# Patient Record
Sex: Female | Born: 1940
Health system: Southern US, Community
[De-identification: ages and names within clinical notes are randomized; demographics above are authoritative.]

## PROBLEM LIST (undated history)

## (undated) DIAGNOSIS — Z9981 Dependence on supplemental oxygen: Secondary | ICD-10-CM

## (undated) DIAGNOSIS — J939 Pneumothorax, unspecified: Secondary | ICD-10-CM

## (undated) DIAGNOSIS — Z9289 Personal history of other medical treatment: Secondary | ICD-10-CM

## (undated) DIAGNOSIS — Z87442 Personal history of urinary calculi: Secondary | ICD-10-CM

## (undated) DIAGNOSIS — R112 Nausea with vomiting, unspecified: Secondary | ICD-10-CM

## (undated) DIAGNOSIS — I209 Angina pectoris, unspecified: Secondary | ICD-10-CM

## (undated) DIAGNOSIS — K228 Other specified diseases of esophagus: Secondary | ICD-10-CM

## (undated) DIAGNOSIS — I2510 Atherosclerotic heart disease of native coronary artery without angina pectoris: Secondary | ICD-10-CM

## (undated) DIAGNOSIS — R0602 Shortness of breath: Secondary | ICD-10-CM

## (undated) DIAGNOSIS — Z720 Tobacco use: Secondary | ICD-10-CM

## (undated) DIAGNOSIS — S069X9A Unspecified intracranial injury with loss of consciousness of unspecified duration, initial encounter: Secondary | ICD-10-CM

## (undated) DIAGNOSIS — T4145XA Adverse effect of unspecified anesthetic, initial encounter: Secondary | ICD-10-CM

## (undated) DIAGNOSIS — Z95828 Presence of other vascular implants and grafts: Secondary | ICD-10-CM

## (undated) DIAGNOSIS — Z8669 Personal history of other diseases of the nervous system and sense organs: Secondary | ICD-10-CM

## (undated) DIAGNOSIS — J449 Chronic obstructive pulmonary disease, unspecified: Secondary | ICD-10-CM

## (undated) DIAGNOSIS — Z8709 Personal history of other diseases of the respiratory system: Secondary | ICD-10-CM

## (undated) DIAGNOSIS — J439 Emphysema, unspecified: Secondary | ICD-10-CM

## (undated) DIAGNOSIS — Z9889 Other specified postprocedural states: Secondary | ICD-10-CM

## (undated) DIAGNOSIS — C801 Malignant (primary) neoplasm, unspecified: Secondary | ICD-10-CM

## (undated) DIAGNOSIS — J4489 Other specified chronic obstructive pulmonary disease: Secondary | ICD-10-CM

## (undated) DIAGNOSIS — F419 Anxiety disorder, unspecified: Secondary | ICD-10-CM

## (undated) DIAGNOSIS — K5900 Constipation, unspecified: Secondary | ICD-10-CM

## (undated) DIAGNOSIS — K222 Esophageal obstruction: Secondary | ICD-10-CM

## (undated) DIAGNOSIS — F99 Mental disorder, not otherwise specified: Secondary | ICD-10-CM

## (undated) DIAGNOSIS — I499 Cardiac arrhythmia, unspecified: Secondary | ICD-10-CM

## (undated) DIAGNOSIS — T8859XA Other complications of anesthesia, initial encounter: Secondary | ICD-10-CM

## (undated) DIAGNOSIS — K219 Gastro-esophageal reflux disease without esophagitis: Secondary | ICD-10-CM

## (undated) DIAGNOSIS — M1990 Unspecified osteoarthritis, unspecified site: Secondary | ICD-10-CM

## (undated) DIAGNOSIS — I10 Essential (primary) hypertension: Secondary | ICD-10-CM

## (undated) DIAGNOSIS — K2289 Other specified disease of esophagus: Secondary | ICD-10-CM

## (undated) DIAGNOSIS — F32A Depression, unspecified: Secondary | ICD-10-CM

## (undated) DIAGNOSIS — Z8774 Personal history of (corrected) congenital malformations of heart and circulatory system: Secondary | ICD-10-CM

## (undated) DIAGNOSIS — F329 Major depressive disorder, single episode, unspecified: Secondary | ICD-10-CM

## (undated) DIAGNOSIS — E785 Hyperlipidemia, unspecified: Secondary | ICD-10-CM

## (undated) HISTORY — DX: Tobacco use: Z72.0

## (undated) HISTORY — PX: MASTECTOMY: SHX3

## (undated) HISTORY — DX: Emphysema, unspecified: J43.9

## (undated) HISTORY — DX: Personal history of other diseases of the respiratory system: Z87.09

## (undated) HISTORY — DX: Personal history of other medical treatment: Z92.89

## (undated) HISTORY — DX: Atherosclerotic heart disease of native coronary artery without angina pectoris: I25.10

## (undated) HISTORY — DX: Other specified postprocedural states: Z98.890

## (undated) HISTORY — PX: COLONOSCOPY: SHX174

## (undated) HISTORY — DX: Unspecified osteoarthritis, unspecified site: M19.90

## (undated) HISTORY — DX: Personal history of other diseases of the nervous system and sense organs: Z86.69

## (undated) HISTORY — PX: CATARACT EXTRACTION: SUR2

## (undated) HISTORY — DX: Chronic obstructive pulmonary disease, unspecified: J44.9

## (undated) HISTORY — PX: BREAST SURGERY: SHX581

## (undated) HISTORY — DX: Gastro-esophageal reflux disease without esophagitis: K21.9

## (undated) HISTORY — DX: Essential (primary) hypertension: I10

## (undated) HISTORY — PX: CARDIAC CATHETERIZATION: SHX172

## (undated) HISTORY — PX: OOPHORECTOMY: SHX86

## (undated) HISTORY — DX: Other specified chronic obstructive pulmonary disease: J44.89

## (undated) HISTORY — PX: BACK SURGERY: SHX140

## (undated) HISTORY — PX: TOTAL ABDOMINAL HYSTERECTOMY W/ BILATERAL SALPINGOOPHORECTOMY: SHX83

## (undated) HISTORY — DX: Hyperlipidemia, unspecified: E78.5

---

## 1974-03-30 HISTORY — PX: LAMINECTOMY: SHX219

## 1976-03-30 HISTORY — PX: MASTECTOMY: SHX3

## 1997-07-28 ENCOUNTER — Encounter: Payer: Self-pay | Admitting: Family Medicine

## 1997-07-28 LAB — CONVERTED CEMR LAB: Pap Smear: NORMAL

## 1998-03-30 HISTORY — PX: CAROTID STENT: SHX1301

## 1998-07-29 DIAGNOSIS — J449 Chronic obstructive pulmonary disease, unspecified: Secondary | ICD-10-CM | POA: Insufficient documentation

## 1998-07-31 ENCOUNTER — Ambulatory Visit (HOSPITAL_COMMUNITY): Admission: RE | Admit: 1998-07-31 | Discharge: 1998-07-31 | Payer: Self-pay | Admitting: Internal Medicine

## 1998-08-29 DIAGNOSIS — K219 Gastro-esophageal reflux disease without esophagitis: Secondary | ICD-10-CM

## 1998-08-29 HISTORY — DX: Gastro-esophageal reflux disease without esophagitis: K21.9

## 1998-08-29 HISTORY — PX: ESOPHAGEAL DILATION: SHX303

## 1999-01-22 DIAGNOSIS — I2510 Atherosclerotic heart disease of native coronary artery without angina pectoris: Secondary | ICD-10-CM | POA: Insufficient documentation

## 1999-01-22 DIAGNOSIS — Z9889 Other specified postprocedural states: Secondary | ICD-10-CM

## 1999-01-22 HISTORY — DX: Other specified postprocedural states: Z98.890

## 1999-01-22 HISTORY — DX: Atherosclerotic heart disease of native coronary artery without angina pectoris: I25.10

## 1999-05-29 DIAGNOSIS — Z9289 Personal history of other medical treatment: Secondary | ICD-10-CM

## 1999-05-29 HISTORY — DX: Personal history of other medical treatment: Z92.89

## 2000-03-30 DIAGNOSIS — I10 Essential (primary) hypertension: Secondary | ICD-10-CM | POA: Insufficient documentation

## 2001-08-25 ENCOUNTER — Encounter: Payer: Self-pay | Admitting: Plastic Surgery

## 2001-08-25 ENCOUNTER — Ambulatory Visit (HOSPITAL_COMMUNITY): Admission: RE | Admit: 2001-08-25 | Discharge: 2001-08-25 | Payer: Self-pay | Admitting: Plastic Surgery

## 2004-05-10 ENCOUNTER — Other Ambulatory Visit: Payer: Self-pay

## 2004-05-10 ENCOUNTER — Emergency Department: Payer: Self-pay | Admitting: General Practice

## 2004-06-08 ENCOUNTER — Emergency Department: Payer: Self-pay | Admitting: Emergency Medicine

## 2004-06-29 ENCOUNTER — Emergency Department (HOSPITAL_COMMUNITY): Admission: EM | Admit: 2004-06-29 | Discharge: 2004-06-29 | Payer: Self-pay | Admitting: Emergency Medicine

## 2004-11-11 ENCOUNTER — Ambulatory Visit: Payer: Self-pay | Admitting: Family Medicine

## 2005-01-28 ENCOUNTER — Ambulatory Visit: Payer: Self-pay | Admitting: Family Medicine

## 2005-04-01 ENCOUNTER — Ambulatory Visit: Payer: Self-pay | Admitting: Family Medicine

## 2005-04-08 ENCOUNTER — Ambulatory Visit: Payer: Self-pay | Admitting: Family Medicine

## 2005-04-23 ENCOUNTER — Ambulatory Visit: Payer: Self-pay | Admitting: Family Medicine

## 2005-04-30 ENCOUNTER — Encounter: Payer: Self-pay | Admitting: Family Medicine

## 2005-04-30 LAB — CONVERTED CEMR LAB: Pap Smear: NORMAL

## 2005-05-04 ENCOUNTER — Encounter: Payer: Self-pay | Admitting: Family Medicine

## 2005-05-04 ENCOUNTER — Other Ambulatory Visit: Admission: RE | Admit: 2005-05-04 | Discharge: 2005-05-04 | Payer: Self-pay | Admitting: Family Medicine

## 2005-05-04 ENCOUNTER — Ambulatory Visit: Payer: Self-pay | Admitting: Family Medicine

## 2005-05-31 ENCOUNTER — Emergency Department: Payer: Self-pay | Admitting: Unknown Physician Specialty

## 2005-06-02 ENCOUNTER — Ambulatory Visit: Payer: Self-pay | Admitting: Family Medicine

## 2005-06-03 ENCOUNTER — Ambulatory Visit: Payer: Self-pay | Admitting: Family Medicine

## 2005-06-16 ENCOUNTER — Ambulatory Visit: Payer: Self-pay | Admitting: Family Medicine

## 2005-11-05 ENCOUNTER — Ambulatory Visit: Payer: Self-pay | Admitting: Family Medicine

## 2006-01-06 ENCOUNTER — Ambulatory Visit: Payer: Self-pay | Admitting: Family Medicine

## 2006-02-15 ENCOUNTER — Other Ambulatory Visit: Payer: Self-pay

## 2006-02-16 ENCOUNTER — Other Ambulatory Visit: Payer: Self-pay

## 2006-02-16 ENCOUNTER — Inpatient Hospital Stay: Payer: Self-pay | Admitting: Internal Medicine

## 2006-04-23 ENCOUNTER — Ambulatory Visit: Payer: Self-pay | Admitting: Family Medicine

## 2006-04-26 ENCOUNTER — Ambulatory Visit: Payer: Self-pay | Admitting: Family Medicine

## 2006-07-19 ENCOUNTER — Encounter: Payer: Self-pay | Admitting: Family Medicine

## 2006-07-19 DIAGNOSIS — I4949 Other premature depolarization: Secondary | ICD-10-CM

## 2006-07-19 DIAGNOSIS — N951 Menopausal and female climacteric states: Secondary | ICD-10-CM

## 2006-09-11 IMAGING — CR DG LUMBAR SPINE 2-3V
1 series · 3 of 3 positions shown · non-contrast
Comparison: none

REASON FOR EXAM: Back pain
COMMENTS:

[Series 1: view not recorded · 0.17mm/px · 3 of 3 slices shown]
[im 1/3]
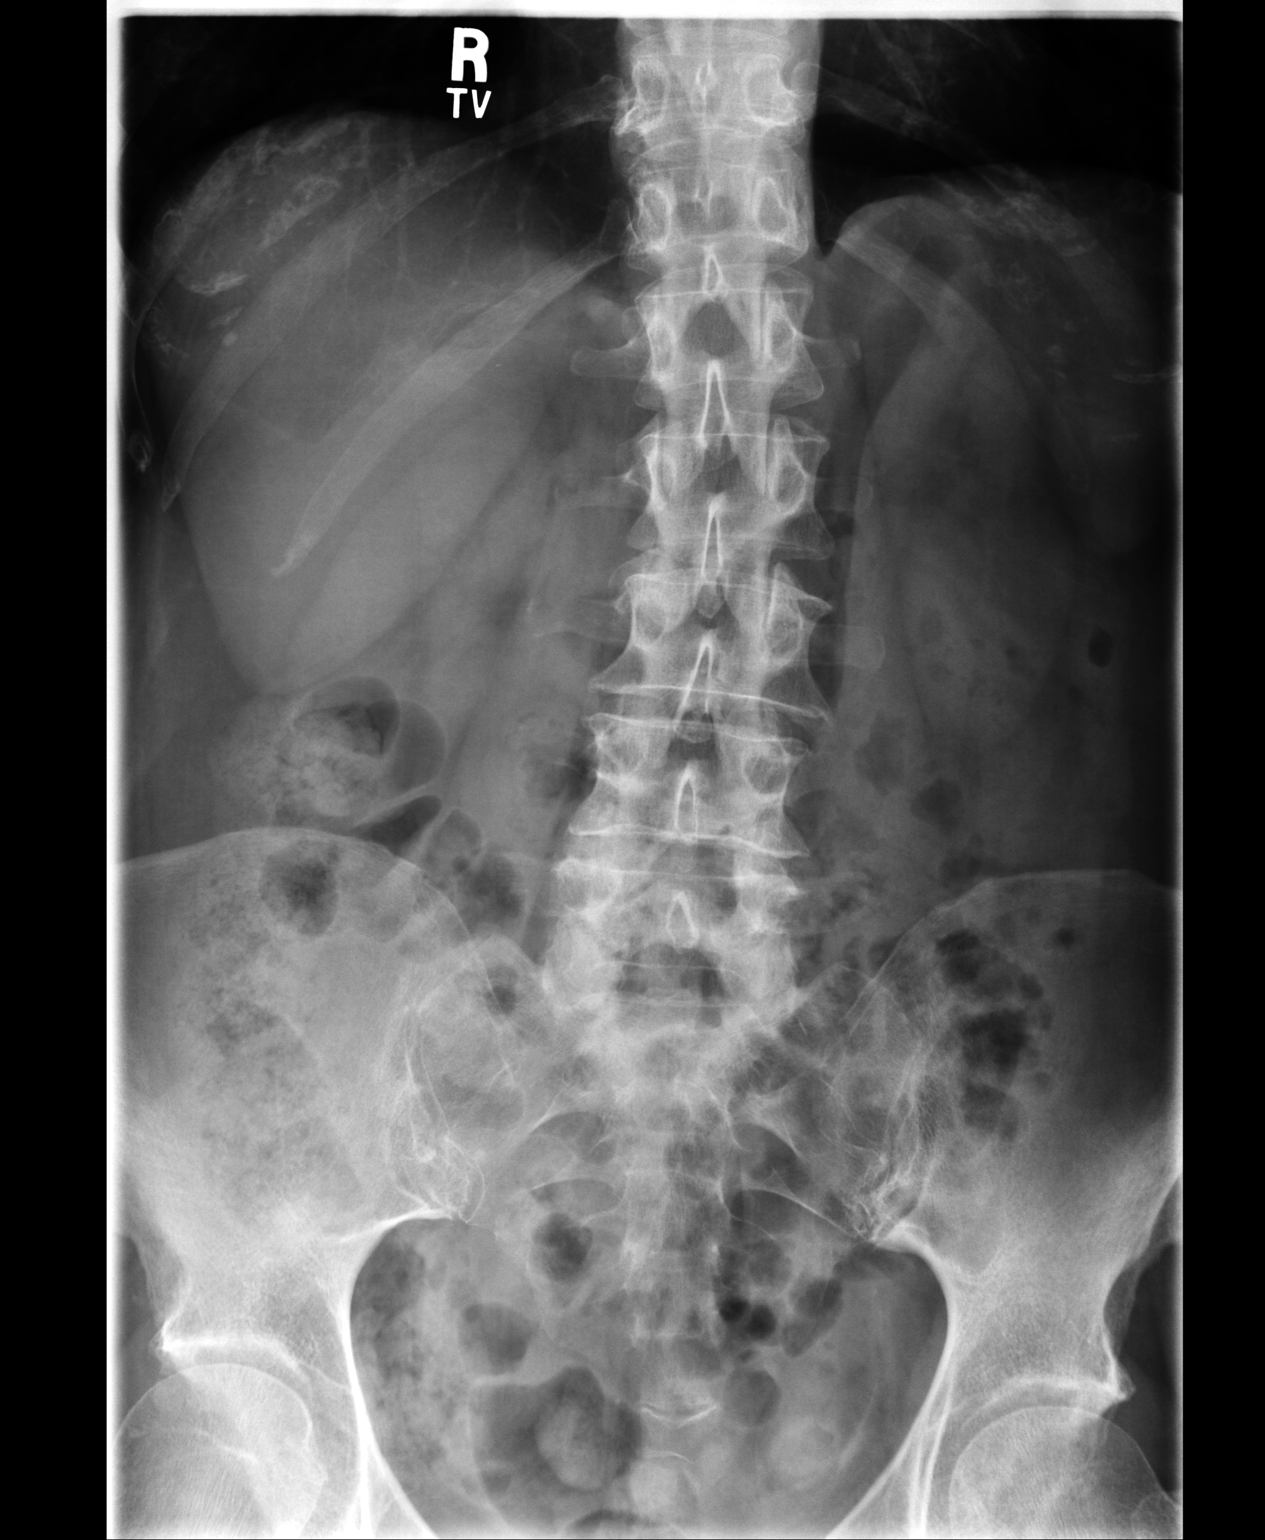
[im 2/3]
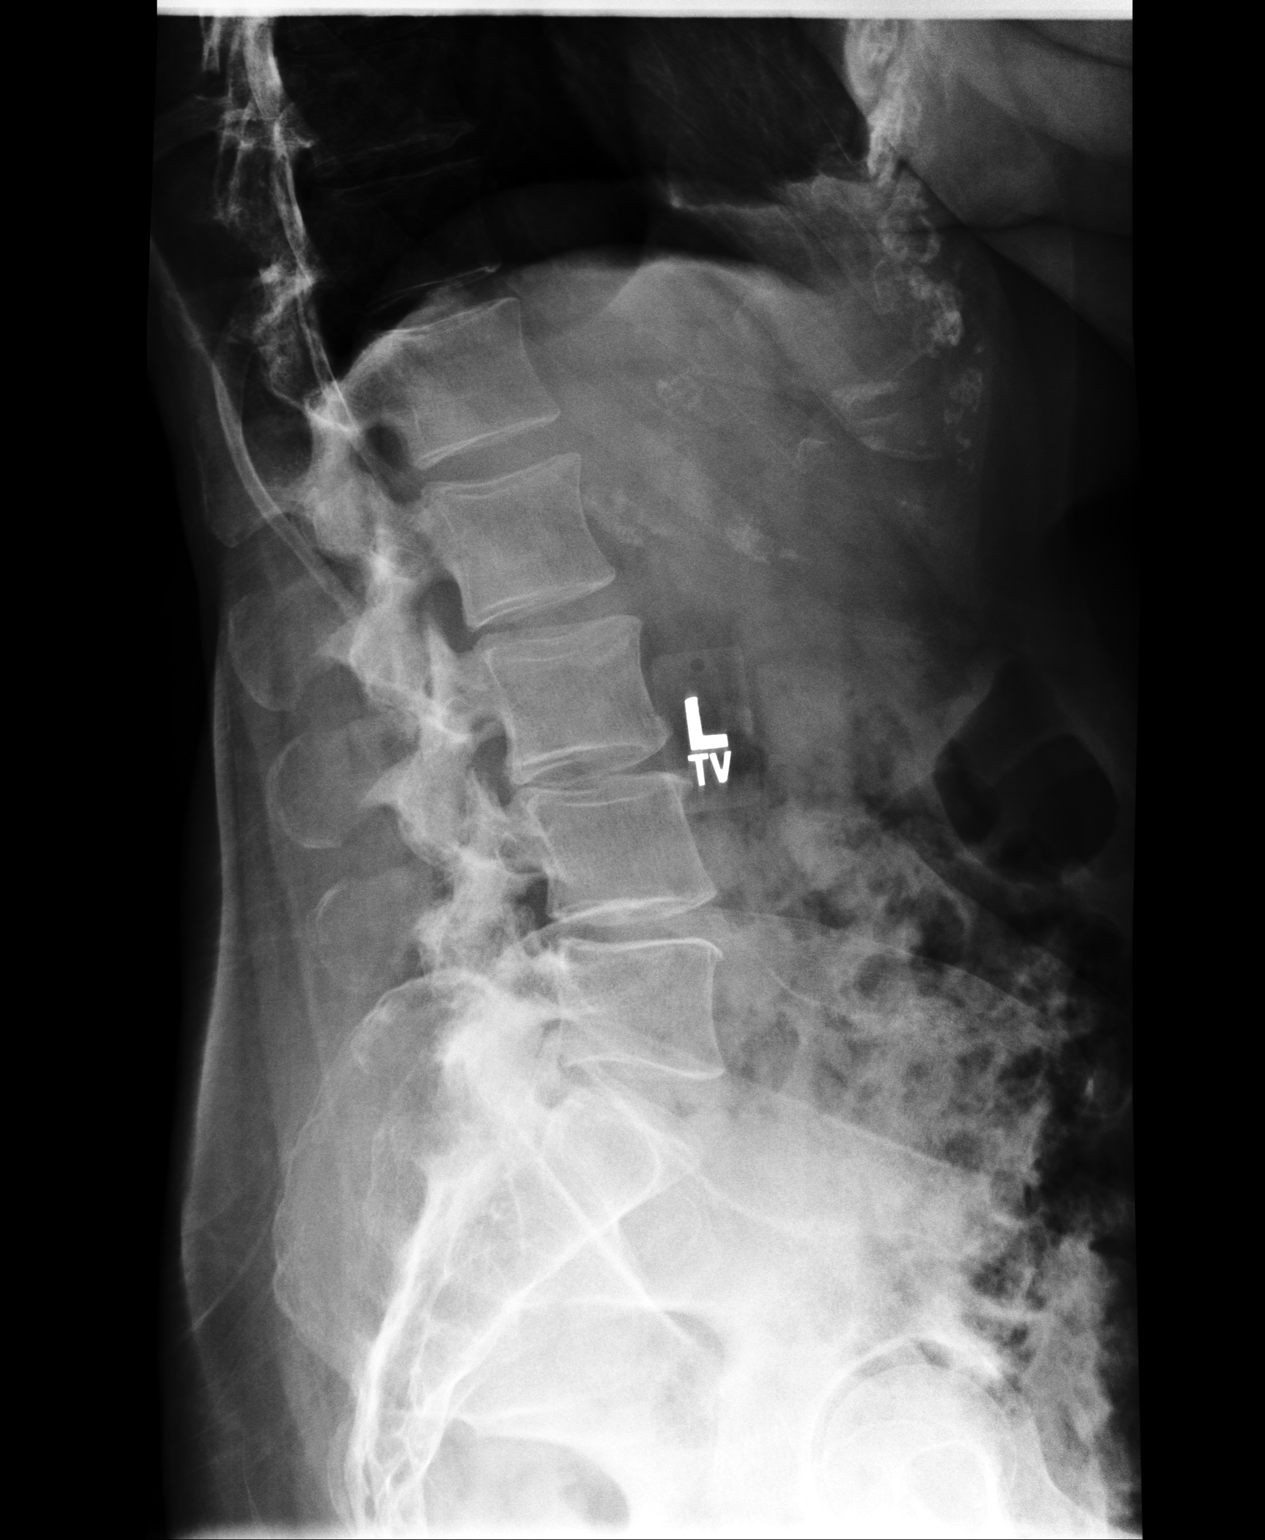
[im 3/3]
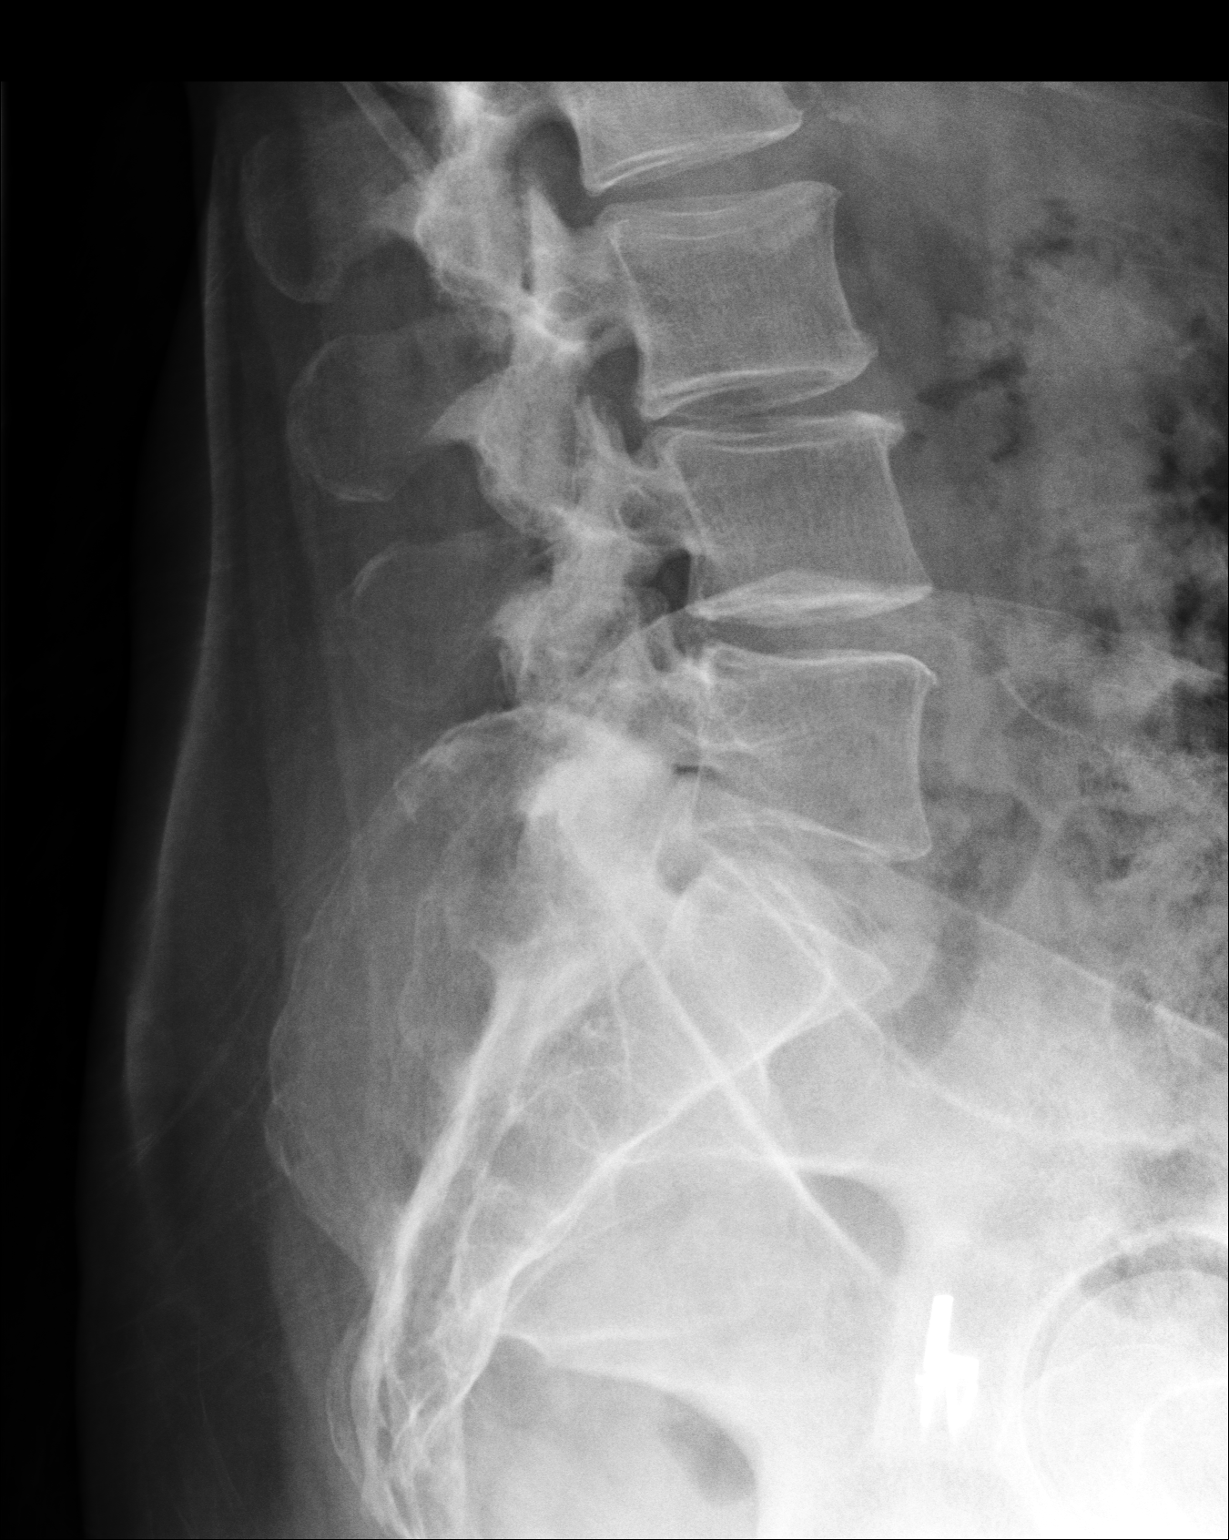

[3 of 3 positions shown; findings below may reference images not displayed]

PROCEDURE:     DXR - DXR LUMBAR SPINE AP AND LATERAL  - May 31, 2005  [DATE]

RESULT:          Five non-rib-bearing lumbar vertebral bodies are
appreciated, counting in a cranial to caudal direction.  The bones are
osteopenic.  There does not appear to be evidence of acute fracture nor
dislocation.  Findings consistent with early/mild degenerative disc disease
is appreciated at the L3-4 and L4-5 levels.

There is mild levoscoliosis of the lumbar spine.

Evaluation of the frontal view of the abdomen demonstrates a large amount of
stool within the ascending colon.
IMPRESSION: No evidence of acute abnormalities within the lumbar
spine.  If there are persistent complaints of pain or persistent clinical
concern, further evaluation with lumbar MRI is recommended if clinically
warranted.

## 2006-09-30 ENCOUNTER — Ambulatory Visit: Payer: Self-pay | Admitting: Family Medicine

## 2006-09-30 DIAGNOSIS — M545 Low back pain: Secondary | ICD-10-CM

## 2006-10-05 ENCOUNTER — Telehealth (INDEPENDENT_AMBULATORY_CARE_PROVIDER_SITE_OTHER): Payer: Self-pay | Admitting: *Deleted

## 2006-10-08 ENCOUNTER — Emergency Department: Payer: Self-pay | Admitting: Emergency Medicine

## 2006-10-22 ENCOUNTER — Ambulatory Visit: Payer: Self-pay | Admitting: Internal Medicine

## 2006-10-22 DIAGNOSIS — R51 Headache: Secondary | ICD-10-CM

## 2006-10-22 DIAGNOSIS — R519 Headache, unspecified: Secondary | ICD-10-CM | POA: Insufficient documentation

## 2006-10-25 LAB — CONVERTED CEMR LAB
Basophils Absolute: 0.1 10*3/uL (ref 0.0–0.1)
Basophils Relative: 0.8 % (ref 0.0–1.0)
Eosinophils Absolute: 0.3 10*3/uL (ref 0.0–0.6)
Eosinophils Relative: 2.8 % (ref 0.0–5.0)
HCT: 43.9 % (ref 36.0–46.0)
Hemoglobin: 15.3 g/dL — ABNORMAL HIGH (ref 12.0–15.0)
Lymphocytes Relative: 33.4 % (ref 12.0–46.0)
MCHC: 35 g/dL (ref 30.0–36.0)
MCV: 89 fL (ref 78.0–100.0)
Monocytes Absolute: 0.6 10*3/uL (ref 0.2–0.7)
Monocytes Relative: 6.3 % (ref 3.0–11.0)
Neutro Abs: 5.8 10*3/uL (ref 1.4–7.7)
Neutrophils Relative %: 56.7 % (ref 43.0–77.0)
Platelets: 274 10*3/uL (ref 150–400)
RBC: 4.93 M/uL (ref 3.87–5.11)
RDW: 11.9 % (ref 11.5–14.6)
Sed Rate: 10 mm/hr (ref 0–25)
WBC: 10.2 10*3/uL (ref 4.5–10.5)

## 2006-10-27 ENCOUNTER — Encounter: Payer: Self-pay | Admitting: Family Medicine

## 2007-01-07 ENCOUNTER — Ambulatory Visit: Payer: Self-pay | Admitting: Family Medicine

## 2007-02-19 ENCOUNTER — Emergency Department: Payer: Self-pay | Admitting: Emergency Medicine

## 2007-02-23 ENCOUNTER — Emergency Department: Payer: Self-pay | Admitting: Emergency Medicine

## 2007-04-20 ENCOUNTER — Other Ambulatory Visit: Payer: Self-pay

## 2007-04-20 ENCOUNTER — Emergency Department: Payer: Self-pay | Admitting: Internal Medicine

## 2007-04-27 ENCOUNTER — Encounter: Payer: Self-pay | Admitting: Family Medicine

## 2007-05-09 ENCOUNTER — Ambulatory Visit: Payer: Self-pay | Admitting: Family Medicine

## 2007-05-09 DIAGNOSIS — R1031 Right lower quadrant pain: Secondary | ICD-10-CM

## 2007-05-09 DIAGNOSIS — R1032 Left lower quadrant pain: Secondary | ICD-10-CM

## 2007-05-10 ENCOUNTER — Encounter: Payer: Self-pay | Admitting: Family Medicine

## 2007-05-13 ENCOUNTER — Telehealth: Payer: Self-pay | Admitting: Family Medicine

## 2007-05-19 ENCOUNTER — Ambulatory Visit: Payer: Self-pay | Admitting: Gastroenterology

## 2007-05-19 LAB — CONVERTED CEMR LAB
ALT: 20 units/L (ref 0–35)
AST: 24 units/L (ref 0–37)
Albumin: 4 g/dL (ref 3.5–5.2)
Alkaline Phosphatase: 75 units/L (ref 39–117)
BUN: 10 mg/dL (ref 6–23)
Basophils Absolute: 0.1 10*3/uL (ref 0.0–0.1)
Basophils Relative: 1.1 % — ABNORMAL HIGH (ref 0.0–1.0)
Bilirubin, Direct: 0.1 mg/dL (ref 0.0–0.3)
CO2: 33 meq/L — ABNORMAL HIGH (ref 19–32)
Calcium: 10 mg/dL (ref 8.4–10.5)
Chloride: 104 meq/L (ref 96–112)
Creatinine, Ser: 0.7 mg/dL (ref 0.4–1.2)
Eosinophils Absolute: 0.2 10*3/uL (ref 0.0–0.6)
Eosinophils Relative: 2.7 % (ref 0.0–5.0)
GFR calc Af Amer: 108 mL/min
GFR calc non Af Amer: 90 mL/min
Glucose, Bld: 80 mg/dL (ref 70–99)
HCT: 45.4 % (ref 36.0–46.0)
Hemoglobin: 15.2 g/dL — ABNORMAL HIGH (ref 12.0–15.0)
Iron: 69 ug/dL (ref 42–145)
Lymphocytes Relative: 32.2 % (ref 12.0–46.0)
MCHC: 33.6 g/dL (ref 30.0–36.0)
MCV: 89.7 fL (ref 78.0–100.0)
Monocytes Absolute: 0.5 10*3/uL (ref 0.2–0.7)
Monocytes Relative: 7 % (ref 3.0–11.0)
Neutro Abs: 3.9 10*3/uL (ref 1.4–7.7)
Neutrophils Relative %: 57 % (ref 43.0–77.0)
Platelets: 260 10*3/uL (ref 150–400)
Potassium: 4.3 meq/L (ref 3.5–5.1)
RBC: 5.06 M/uL (ref 3.87–5.11)
RDW: 11.8 % (ref 11.5–14.6)
Sed Rate: 7 mm/hr (ref 0–25)
Sodium: 143 meq/L (ref 135–145)
TSH: 1.01 microintl units/mL (ref 0.35–5.50)
Total Bilirubin: 0.6 mg/dL (ref 0.3–1.2)
Total Protein: 7.2 g/dL (ref 6.0–8.3)
WBC: 7 10*3/uL (ref 4.5–10.5)

## 2007-05-23 ENCOUNTER — Encounter: Payer: Self-pay | Admitting: Family Medicine

## 2007-05-23 ENCOUNTER — Encounter: Payer: Self-pay | Admitting: Gastroenterology

## 2007-05-23 ENCOUNTER — Ambulatory Visit: Payer: Self-pay | Admitting: Gastroenterology

## 2007-05-23 LAB — HM COLONOSCOPY: HM Colonoscopy: ABNORMAL

## 2007-05-29 IMAGING — CR DG CHEST 2V
1 series · 2 of 2 positions shown · non-contrast
Comparison: none

REASON FOR EXAM: Short of breath,  chest tightness
COMMENTS:

PROCEDURE:     DXR - DXR CHEST PA (OR AP) AND LATERAL  - February 15, 2006  [DATE]
RESULT:     The mediastinum and hilar structures are normal.  The lungs are
clear.  Cardiovascular structures are unremarkable.

[Series 1: view not recorded · 0.17mm/px · 2 of 2 slices shown]
[im 1/2]
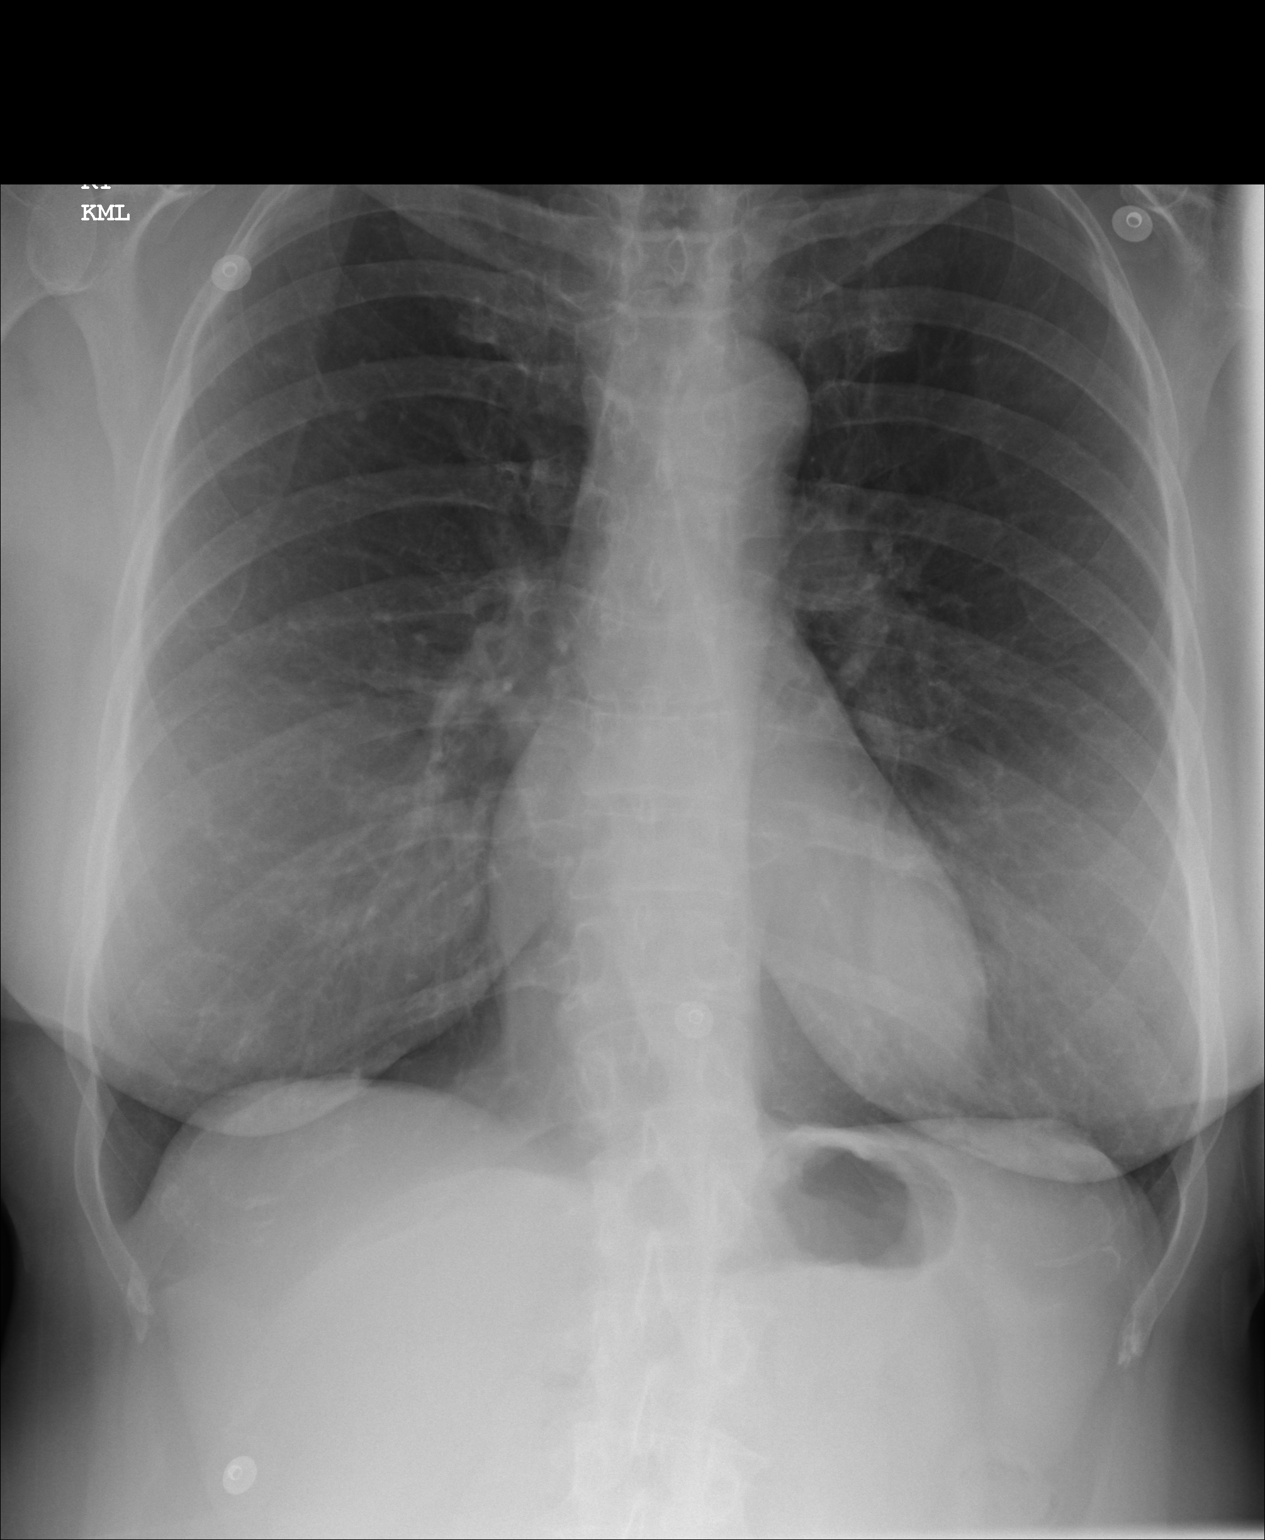
[im 2/2]
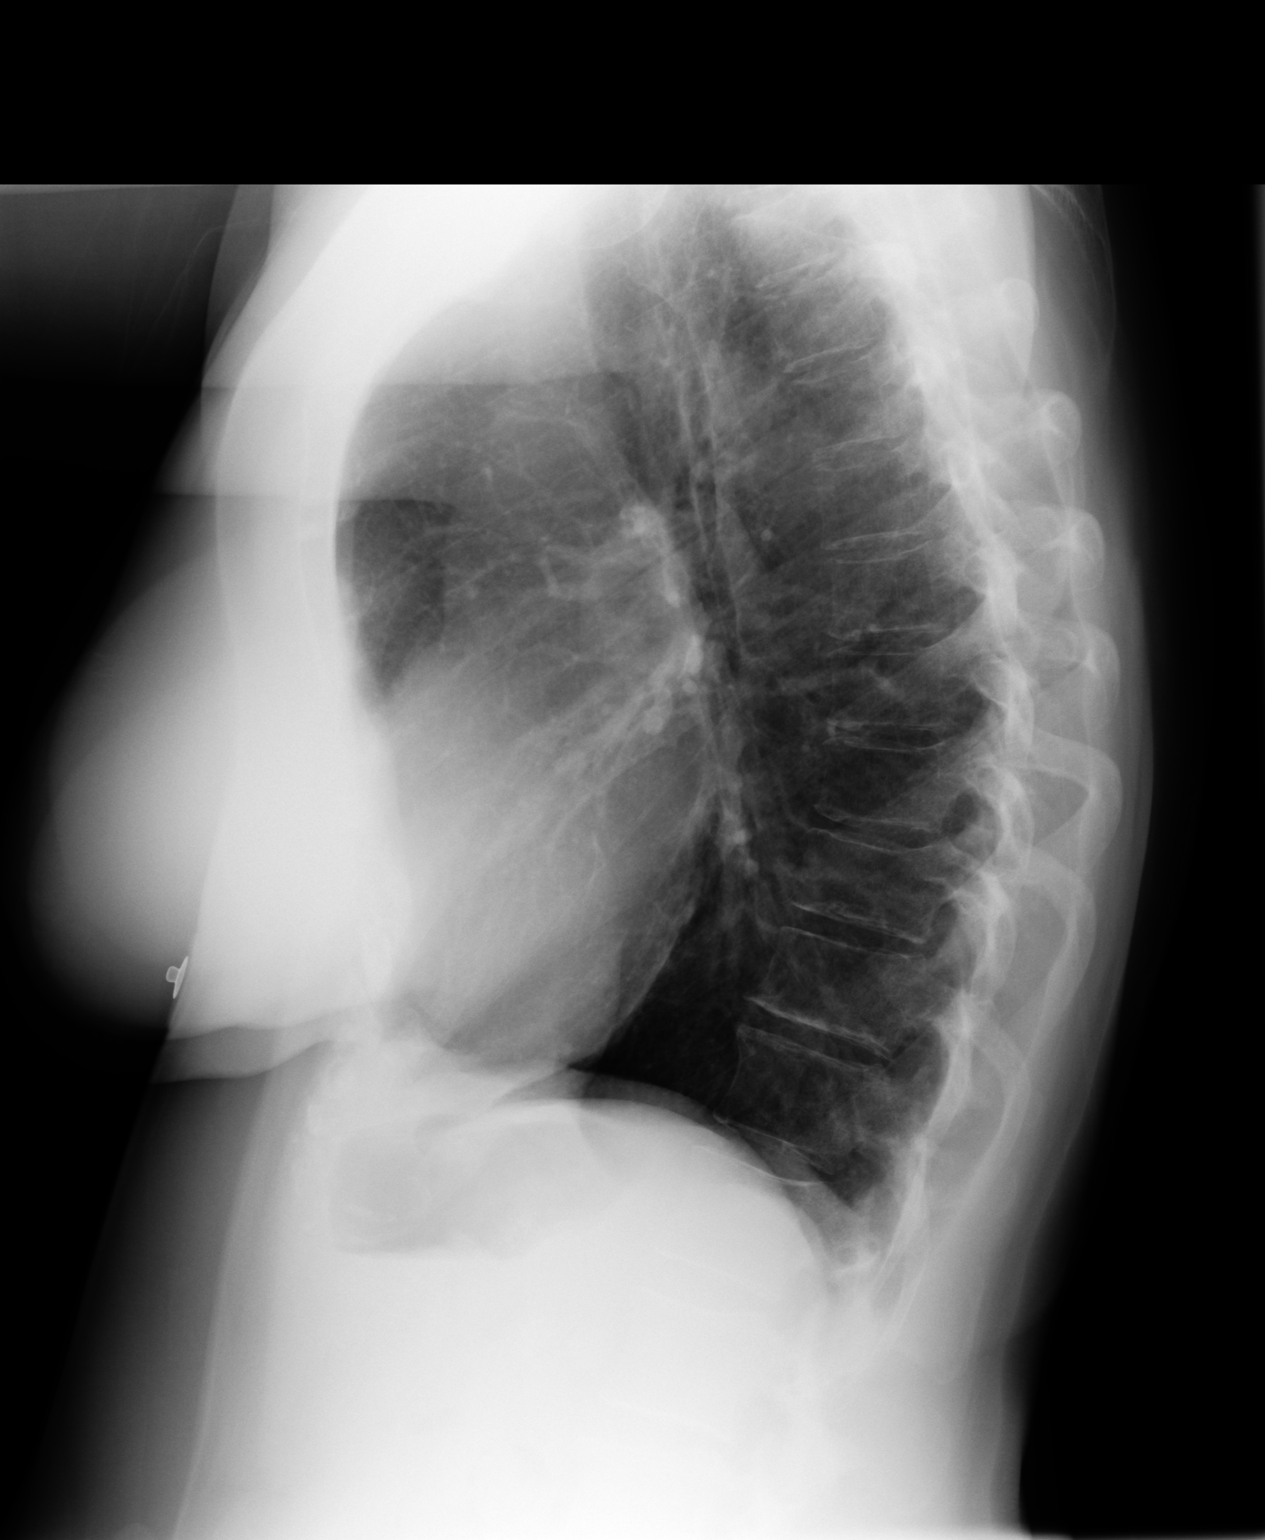

[2 of 2 positions shown; findings below may reference images not displayed]

IMPRESSION: No acute cardiopulmonary disease.

## 2007-05-30 IMAGING — CT CT CHEST W/ CM
1 series · 16 of 33 positions shown, 20 images · IV contrast (APPLIED)
Comparison: none

REASON FOR EXAM: UNEXPLAINED DYSPNEA
COMMENTS:

[Series 4: soft tissue · axial · 0.74mm/px · z∈[-86,+240]mm · 16 of 119 slices shown, 20 images]
[im 5/119  mediastinal]
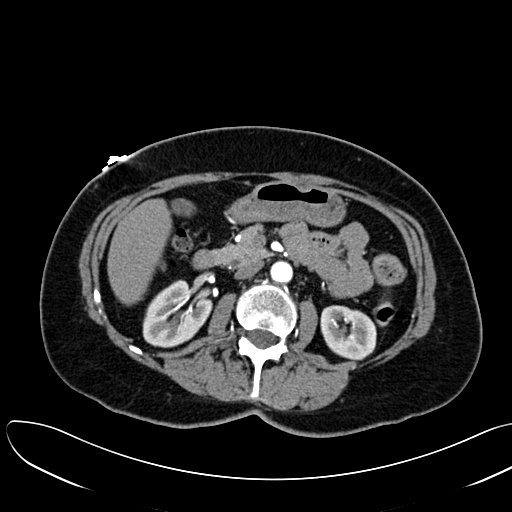
[im 5/119  lung]
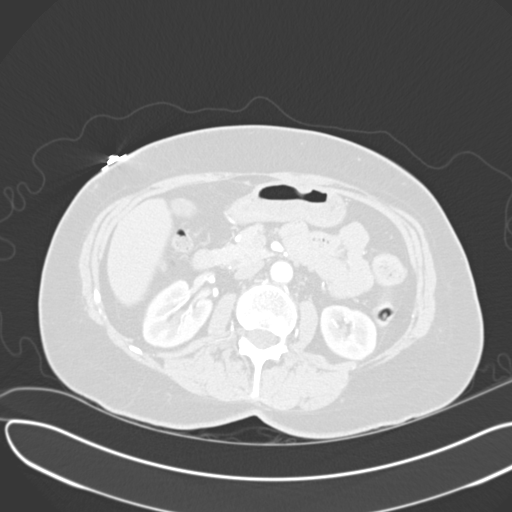
[im 14/119  lung]
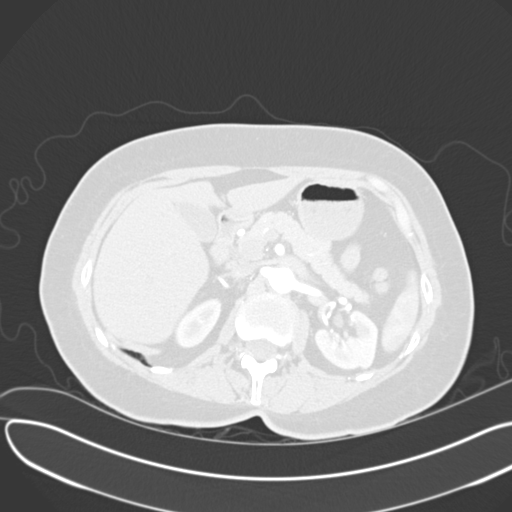
[im 22/119  lung]
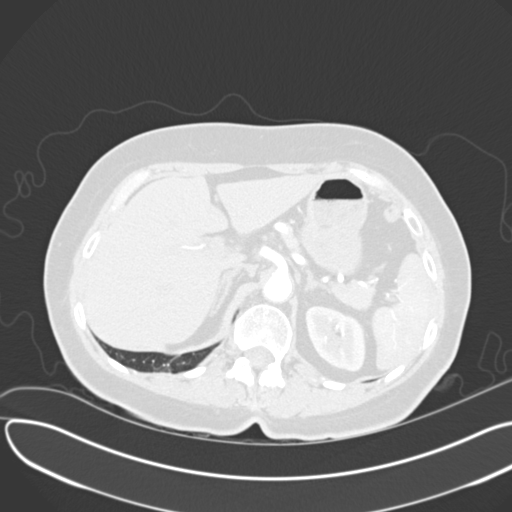
[im 27/119  lung]
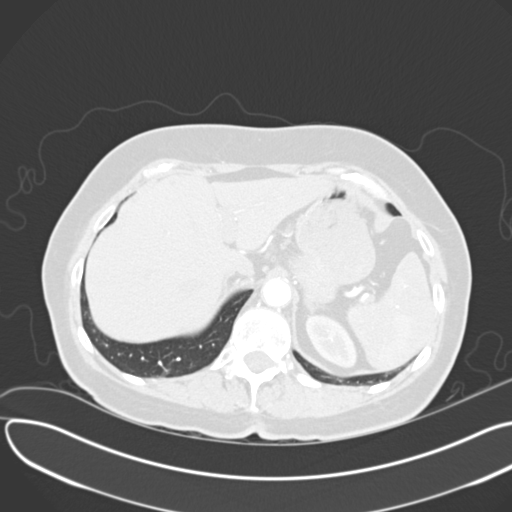
[im 35/119  mediastinal]
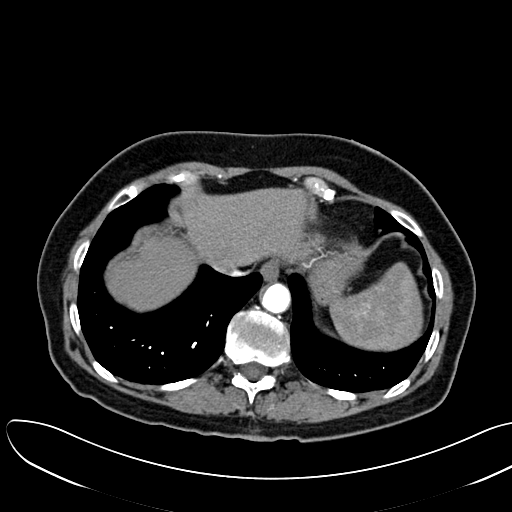
[im 35/119  lung]
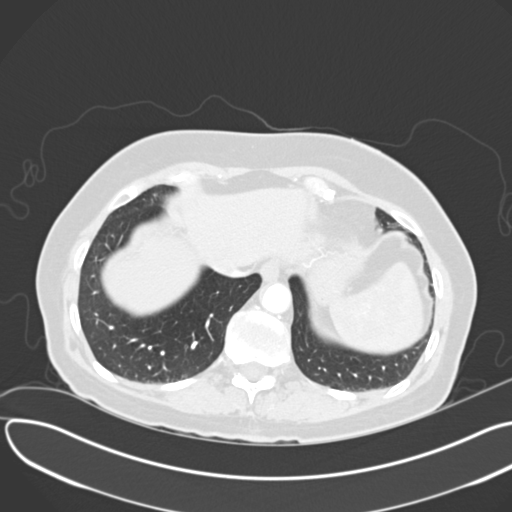
[im 44/119  lung]
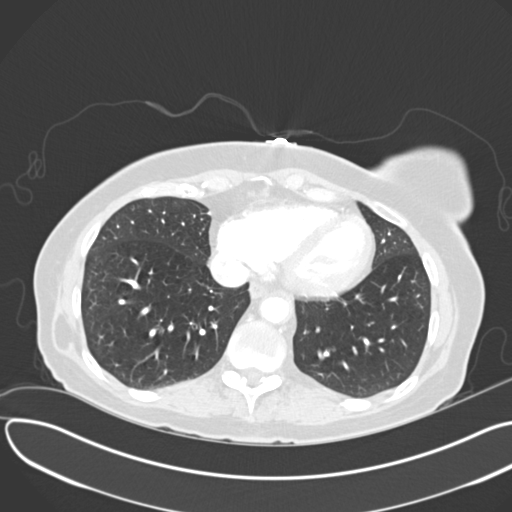
[im 49/119  lung]
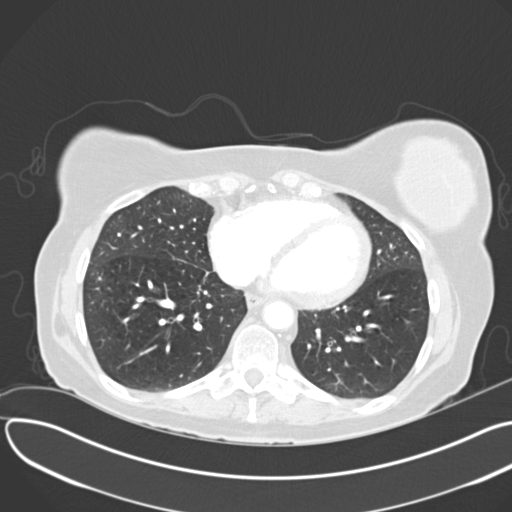
[im 57/119  lung]
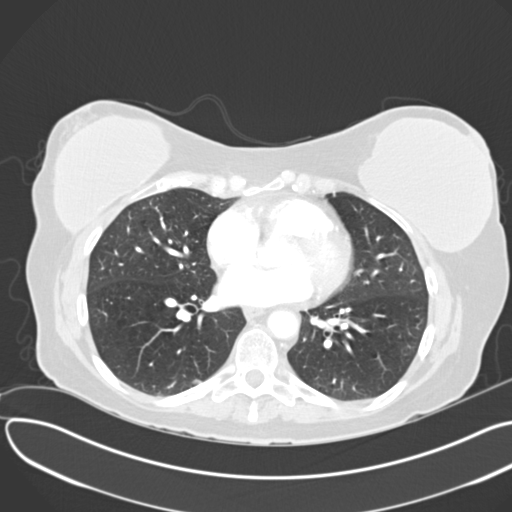
[im 63/119  mediastinal]
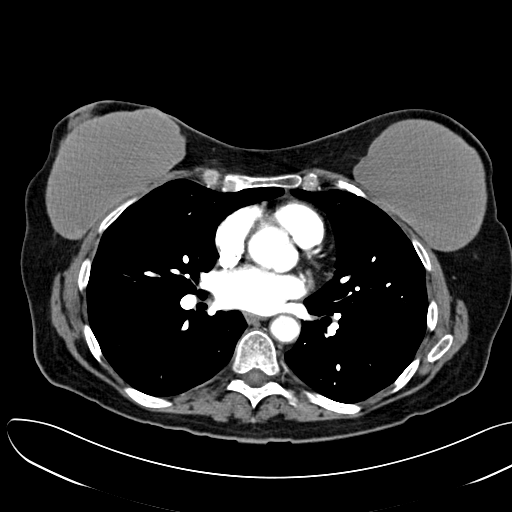
[im 63/119  lung]
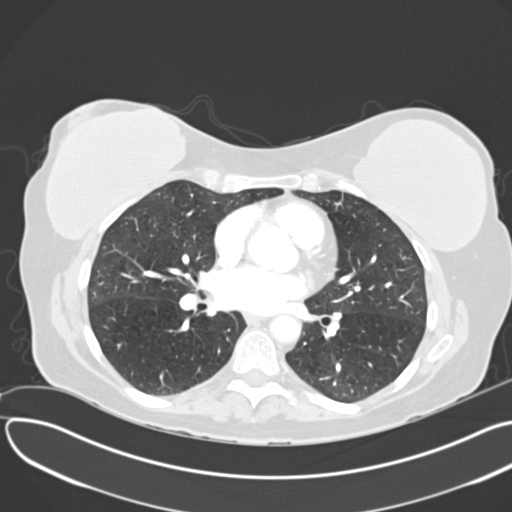
[im 70/119  lung]
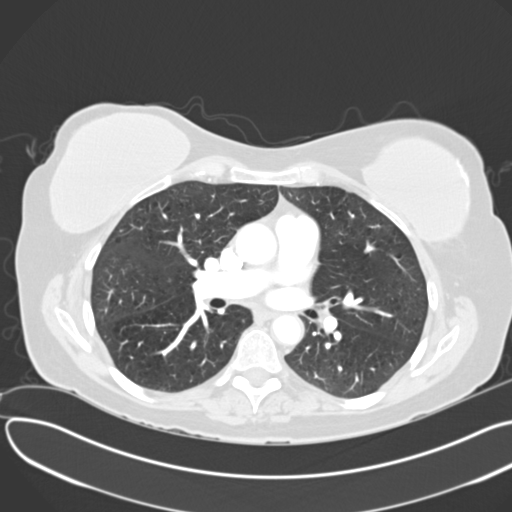
[im 75/119  lung]
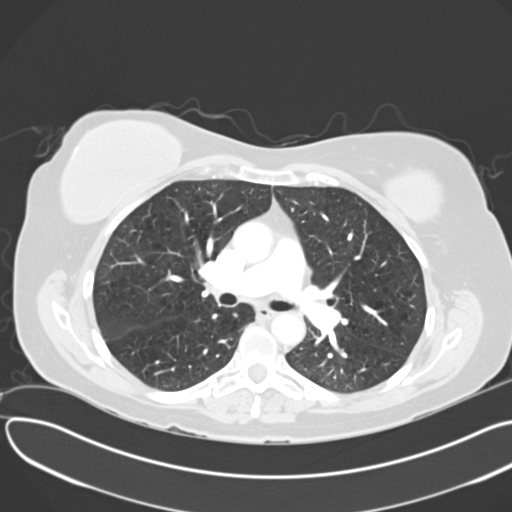
[im 84/119  lung]
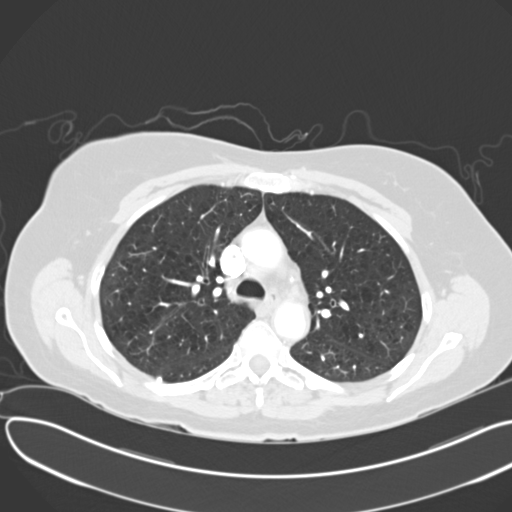
[im 92/119  mediastinal]
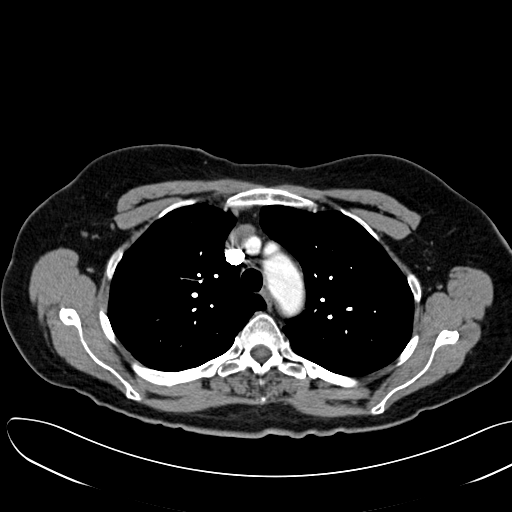
[im 92/119  lung]
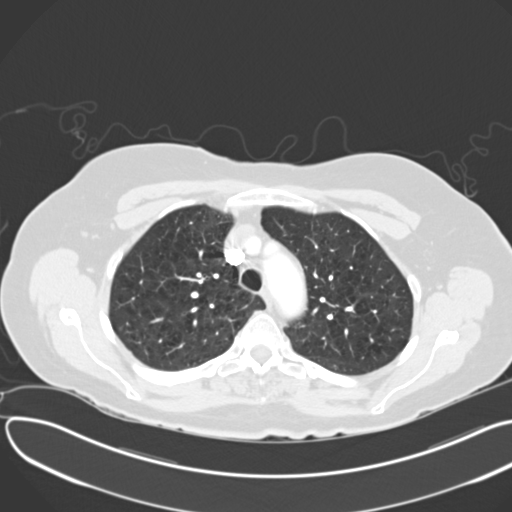
[im 97/119  lung]
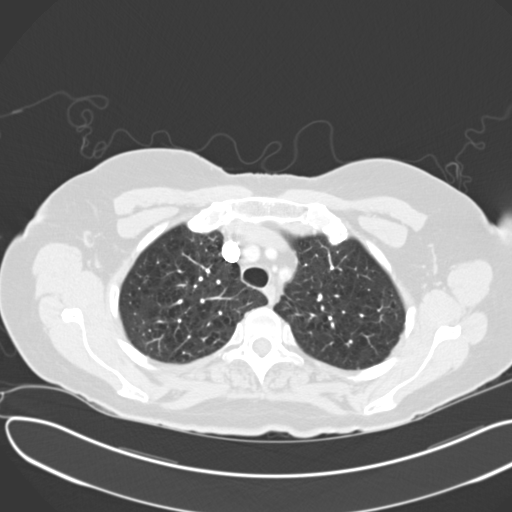
[im 105/119  lung]
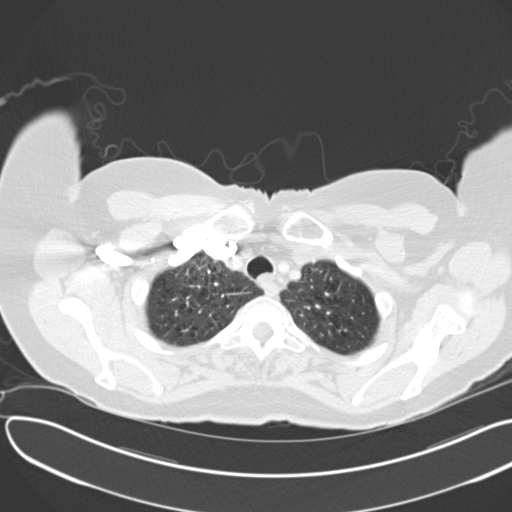
[im 114/119  lung]
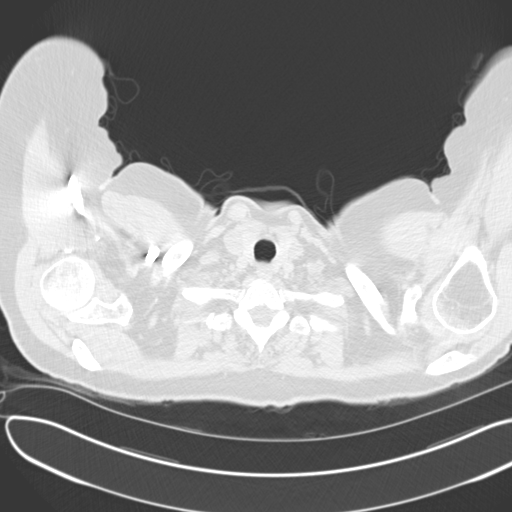

[16 of 33 positions shown; findings below may reference images not displayed]

PROCEDURE:     CT  - CT CHEST (FOR PE) W  - February 16, 2006 [DATE]

RESULT:       The patient has no prior study for comparison.  Bilateral
breast implants are present.  There is excellent opacification of the
pulmonary arterial system. There is no filling defect to suggest a diagnosis
of pulmonary embolism.  The thoracic aorta is normal in caliber.  There is
no evidence of dissection. There is no pleural or pericardial effusion.  The
upper abdominal viscera included on the study appear to be grossly normal.
No gallstones are evident.  Degenerative changes are seen in the spine.
There is some apical fibrosis bilaterally.  Underlying diffuse emphysematous
lung disease changes are present.  There is no evidence of a focal pulmonary
parenchymal mass.  There is no pneumothorax.  There is no focal
consolidation.
IMPRESSION: 1.     Mild COPD changes.
2.     No acute abnormality seen.
3.     No pulmonary embolus.

## 2007-06-03 ENCOUNTER — Encounter: Payer: Self-pay | Admitting: Family Medicine

## 2007-06-03 ENCOUNTER — Ambulatory Visit: Payer: Self-pay | Admitting: Gastroenterology

## 2007-06-06 ENCOUNTER — Ambulatory Visit: Payer: Self-pay | Admitting: Cardiology

## 2007-06-23 ENCOUNTER — Ambulatory Visit: Payer: Self-pay | Admitting: Gastroenterology

## 2007-09-05 ENCOUNTER — Telehealth: Payer: Self-pay | Admitting: Family Medicine

## 2007-12-01 ENCOUNTER — Telehealth: Payer: Self-pay | Admitting: Family Medicine

## 2008-01-03 ENCOUNTER — Ambulatory Visit: Payer: Self-pay | Admitting: Family Medicine

## 2008-01-19 IMAGING — CR DG LUMBAR SPINE 2-3V
1 series · 3 of 3 positions shown · non-contrast
Comparison: none

REASON FOR EXAM: pain
COMMENTS:

[Series 1: view not recorded · 0.17mm/px · 3 of 3 slices shown]
[im 1/3]
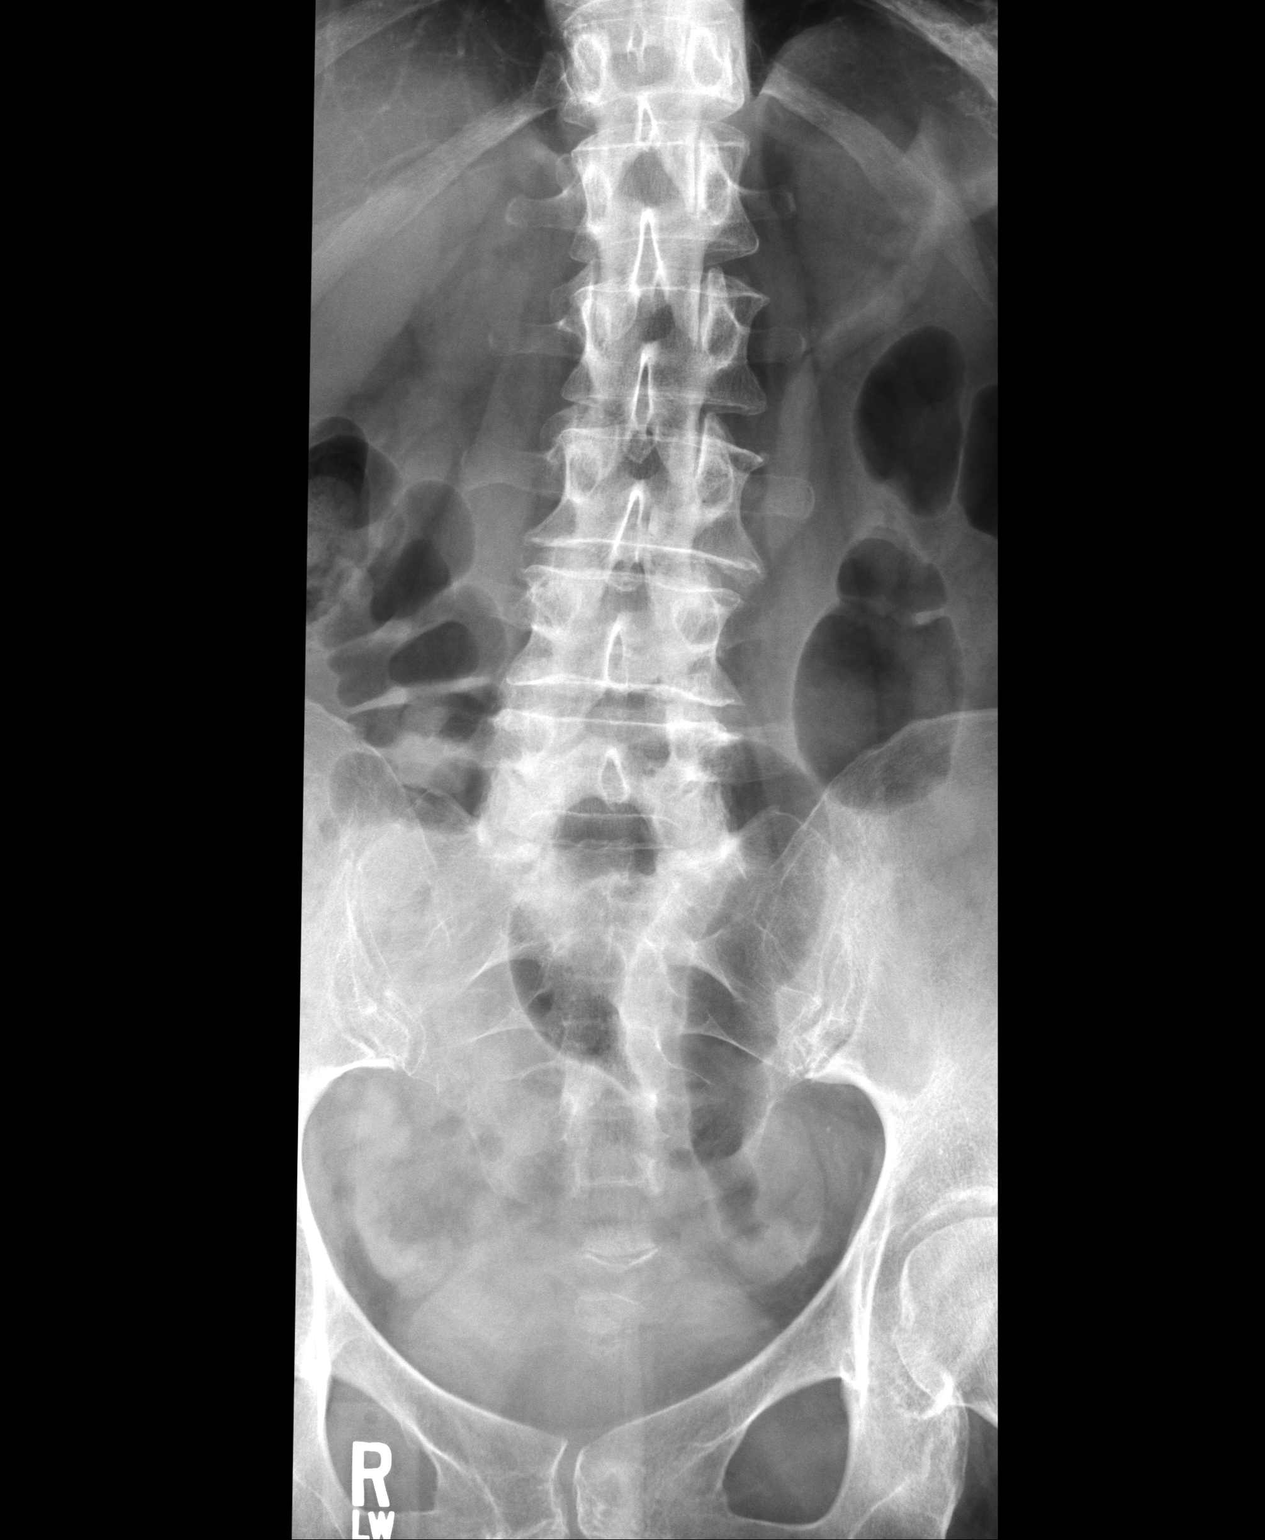
[im 2/3]
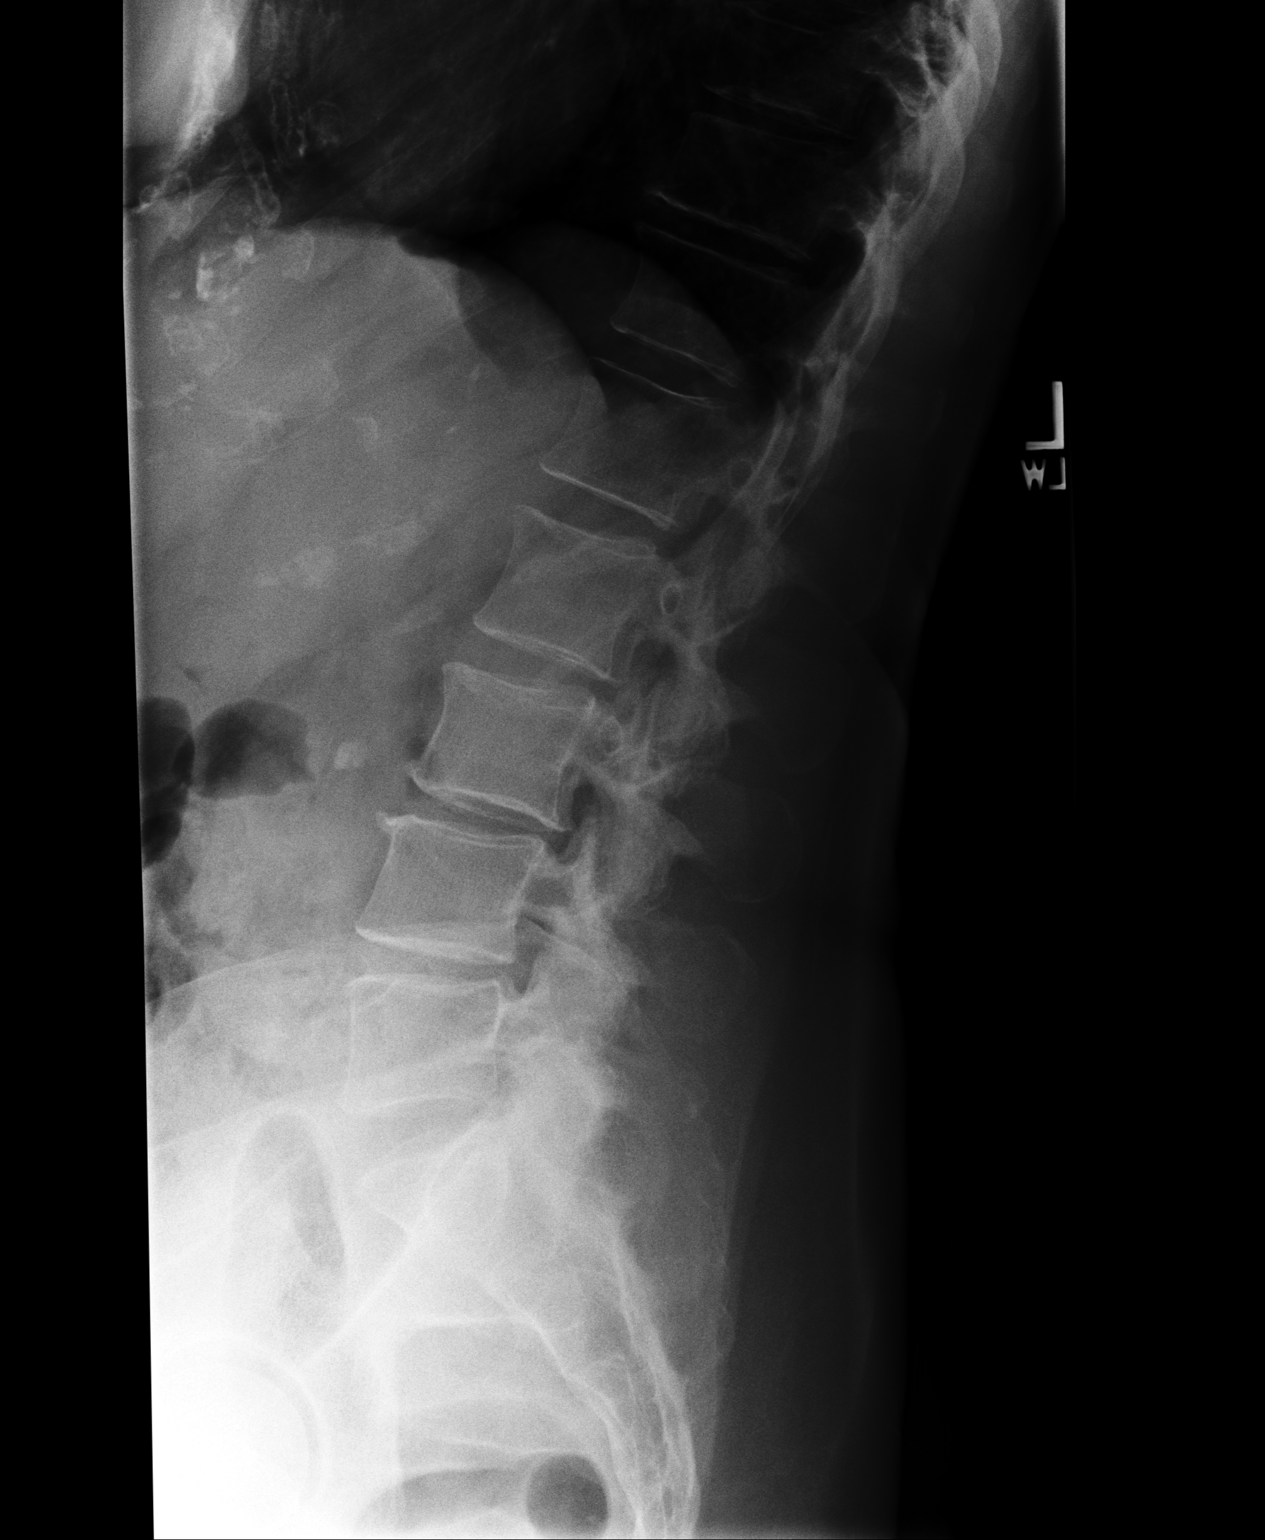
[im 3/3]
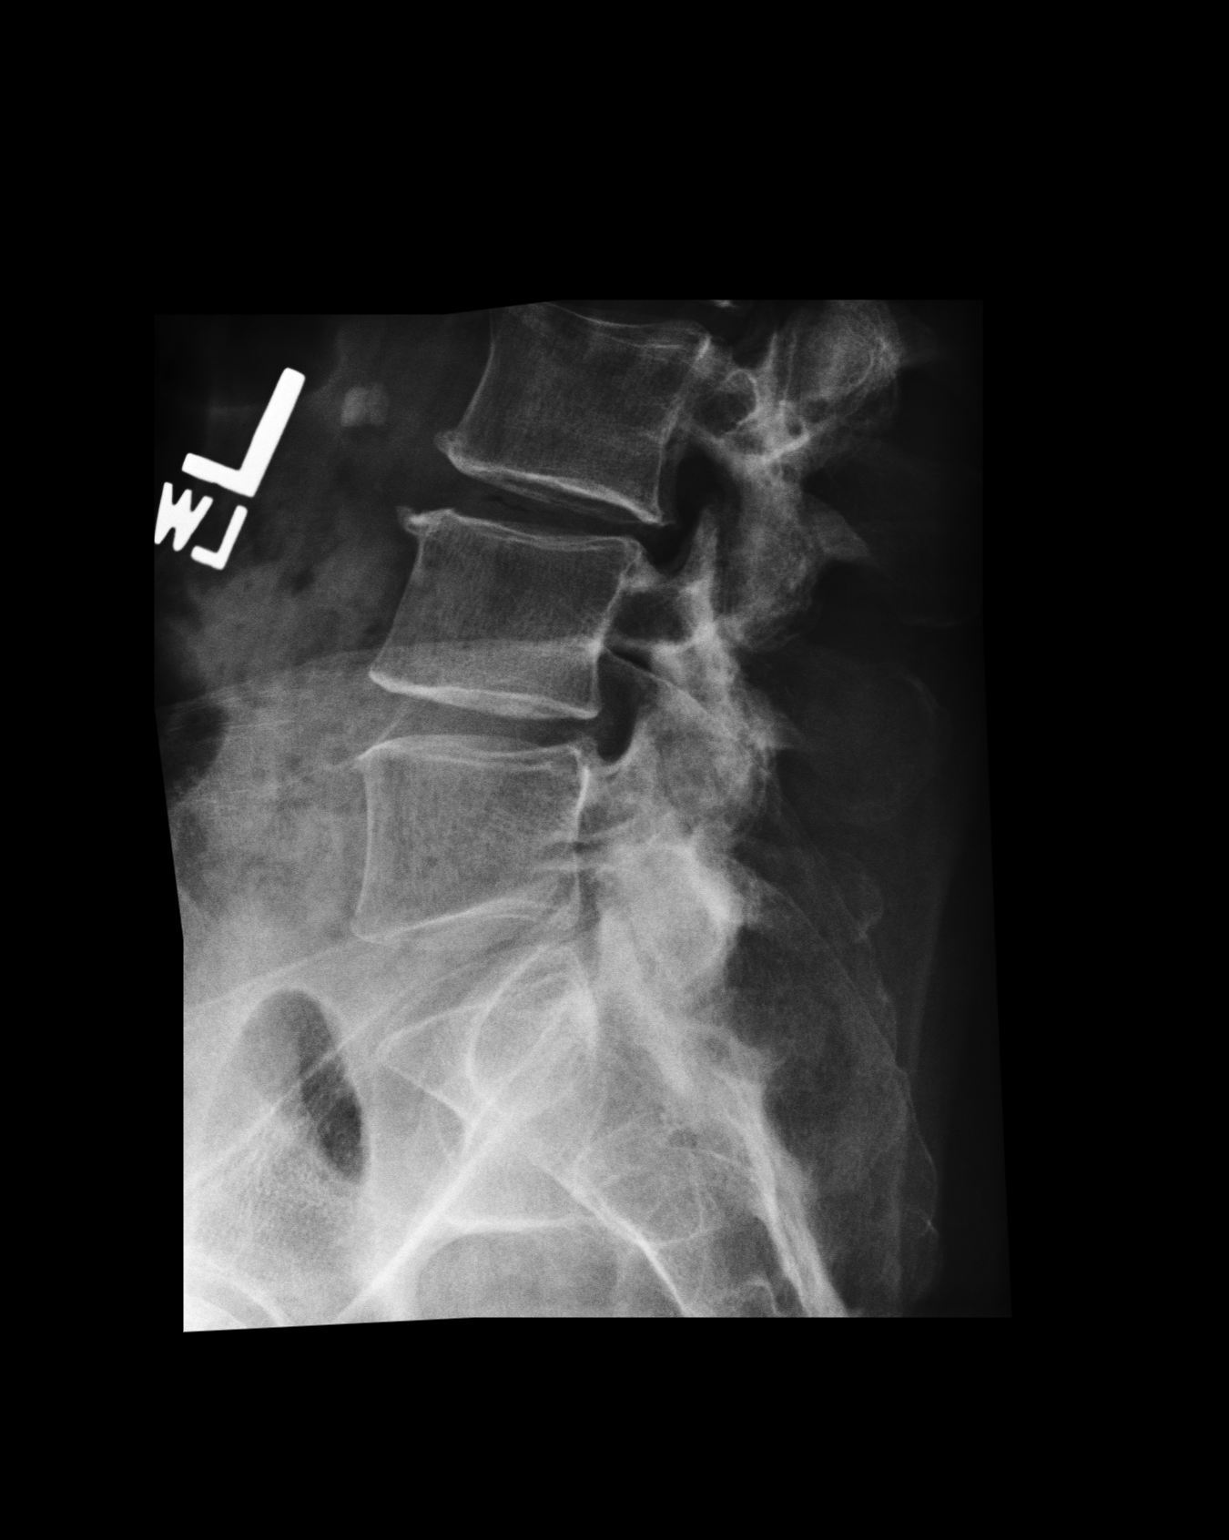

[3 of 3 positions shown; findings below may reference images not displayed]

PROCEDURE:     DXR - DXR LUMBAR SPINE AP AND LATERAL  - October 08, 2006 [DATE]

RESULT:     The lumbar vertebral bodies are preserved in height. Mild
narrowing of the L4-L5 disc is seen and there is endplate spurring at L3-L4.
There is gentle curvature of the lumbar spine convex toward the LEFT. The
pedicles and transverse processes are intact.
IMPRESSION: There are mild degenerative changes involving the disc spaces and the facet
joints in the lower lumbar spine. There is no evidence of a compression
fracture. If there are radicular symptoms, a followup MRI may be of value.

## 2008-03-19 ENCOUNTER — Ambulatory Visit: Payer: Self-pay | Admitting: Family Medicine

## 2008-03-26 ENCOUNTER — Ambulatory Visit: Payer: Self-pay | Admitting: Family Medicine

## 2008-05-07 ENCOUNTER — Emergency Department: Payer: Self-pay | Admitting: Emergency Medicine

## 2008-05-08 ENCOUNTER — Ambulatory Visit: Payer: Self-pay | Admitting: Physician Assistant

## 2008-05-19 ENCOUNTER — Emergency Department: Payer: Self-pay | Admitting: Emergency Medicine

## 2008-05-31 ENCOUNTER — Telehealth: Payer: Self-pay | Admitting: Family Medicine

## 2008-07-02 ENCOUNTER — Telehealth: Payer: Self-pay | Admitting: Family Medicine

## 2008-07-05 ENCOUNTER — Encounter: Payer: Self-pay | Admitting: Family Medicine

## 2008-09-16 IMAGING — CT CT ANGIO ABDOMEN
2 of 8 series · 12 of 46 positions shown, 18 images · non-contrast
Comparison: none

CLINICAL DATA: Low abdominal pain,  weight loss.

[Series 5: cta_abd 3.0 b30f st · axial · 0.77mm/px · z∈[-244,-34]mm · 10 of 86 slices shown, 16 images]
[im 8/86  soft-tissue]
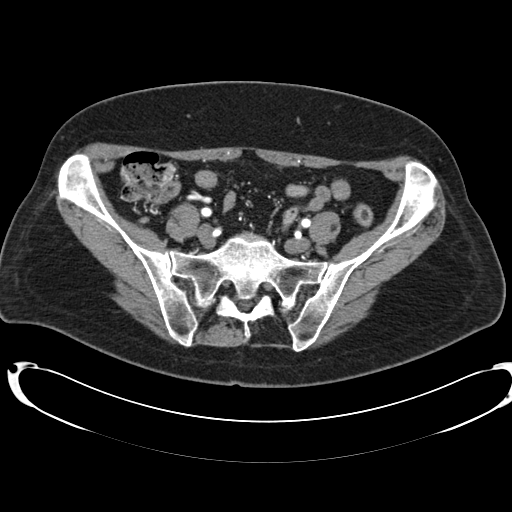
[im 8/86  bone]
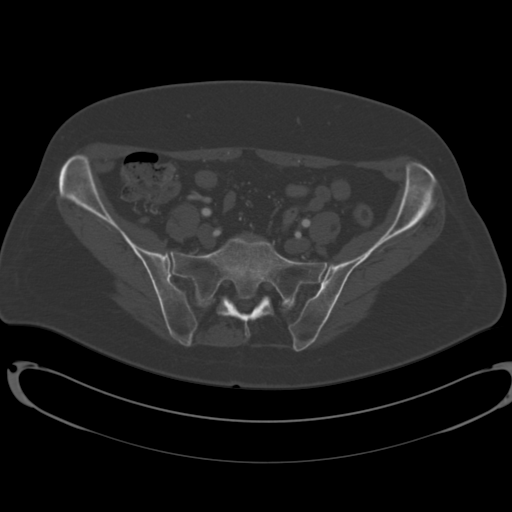
[im 16/86  soft-tissue]
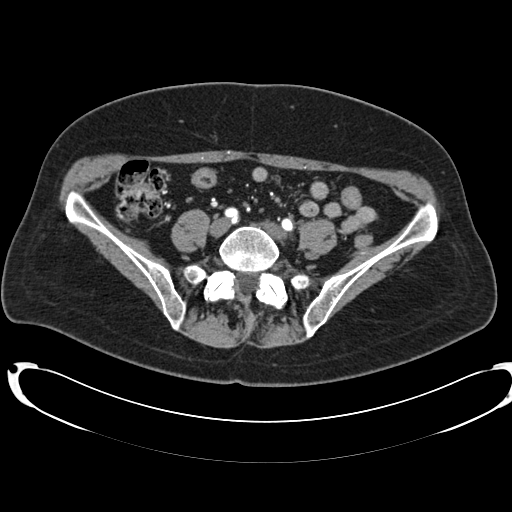
[im 24/86  soft-tissue]
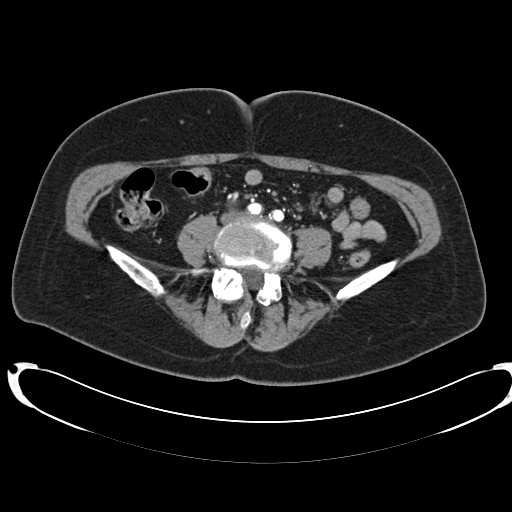
[im 31/86  soft-tissue]
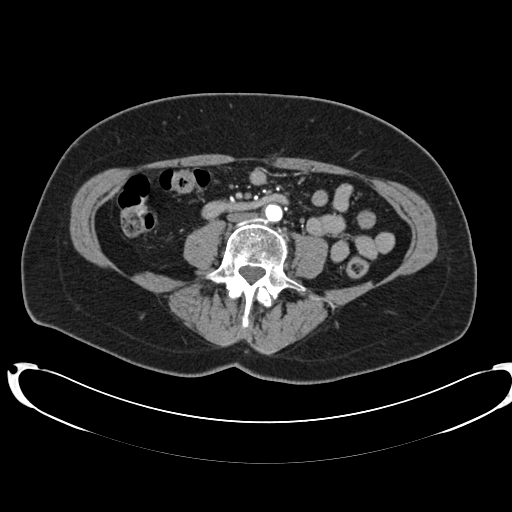
[im 39/86  soft-tissue]
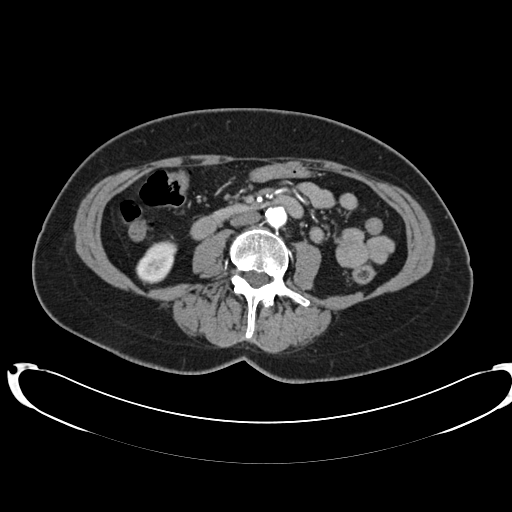
[im 47/86  soft-tissue]
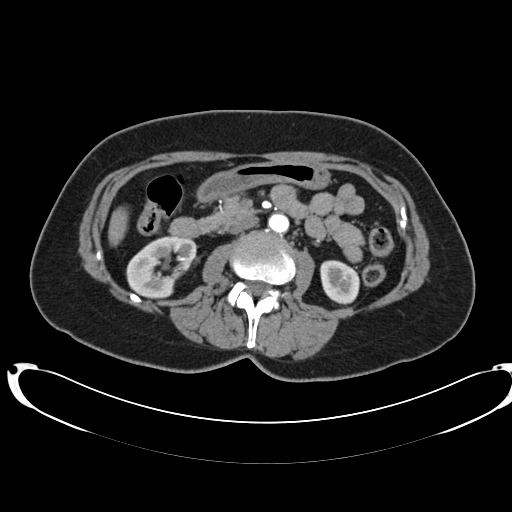
[im 55/86  soft-tissue]
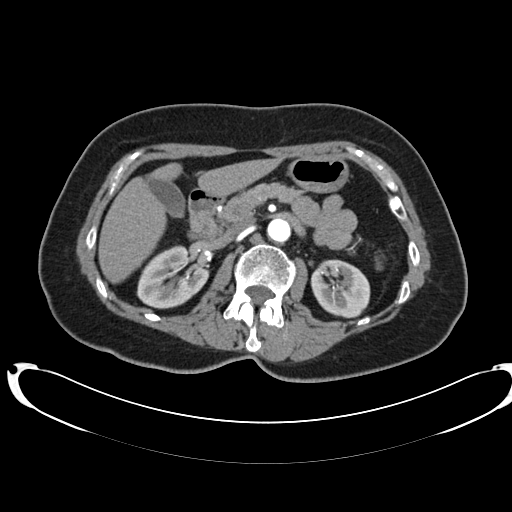
[im 55/86  lung]
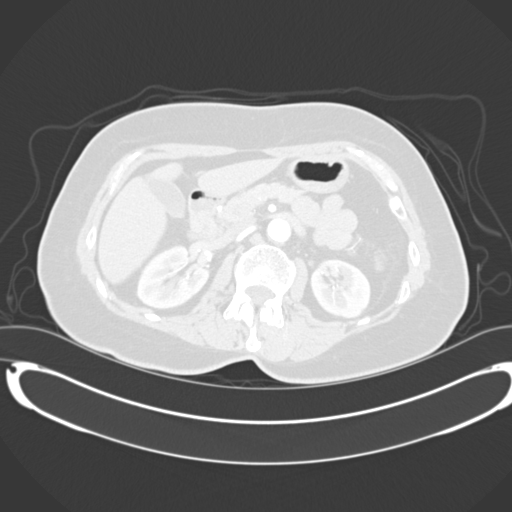
[im 62/86  soft-tissue]
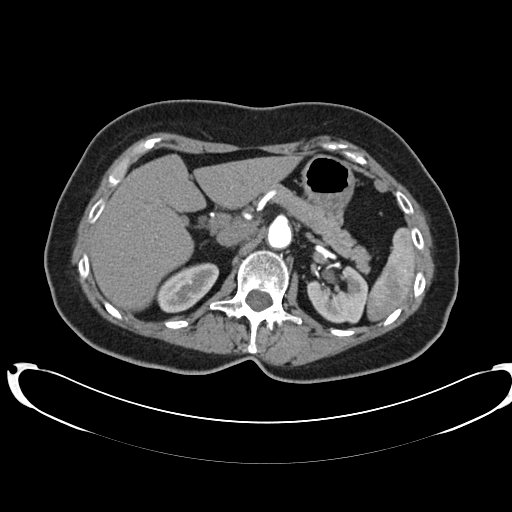
[im 62/86  lung]
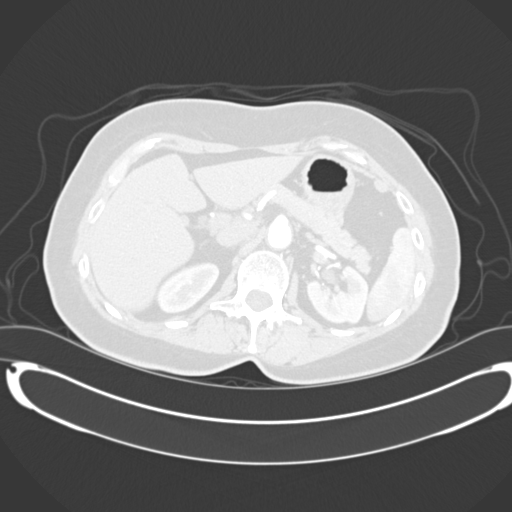
[im 70/86  soft-tissue]
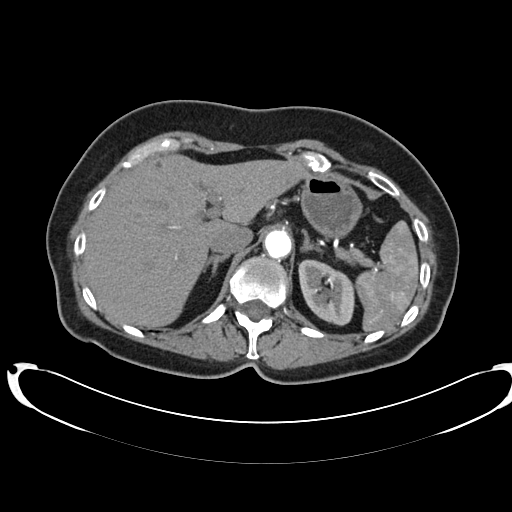
[im 70/86  lung]
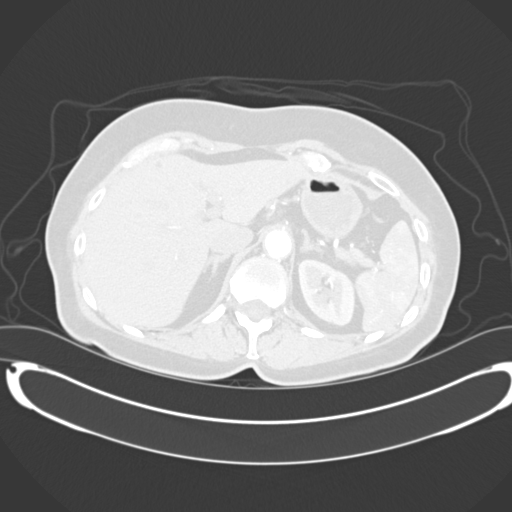
[im 70/86  bone]
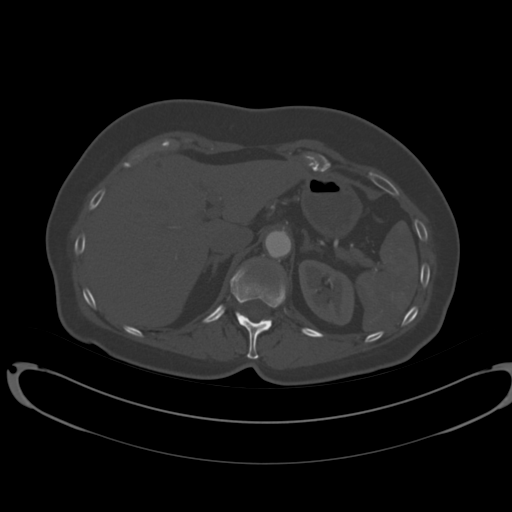
[im 78/86  soft-tissue]
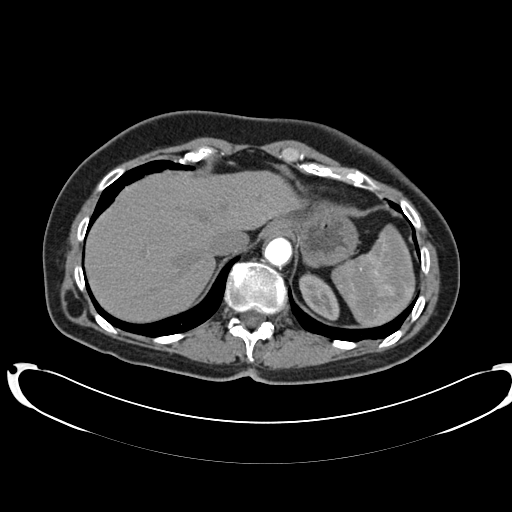
[im 78/86  lung]
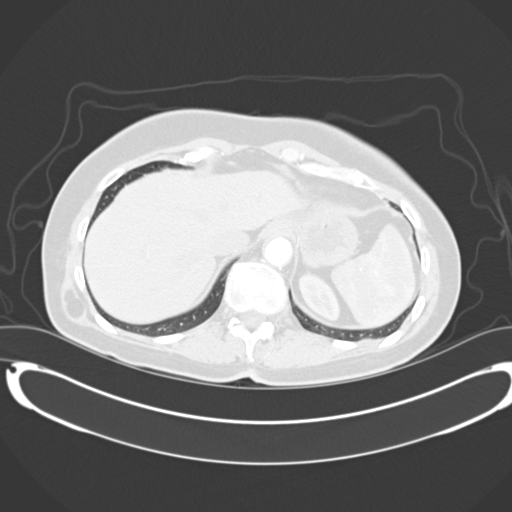

[Series 8: kidney_delay 5.0 b30f st · axial · 0.71mm/px · z∈[-112,-56]mm · 2 of 33 slices shown]
[im 11/33  soft-tissue]
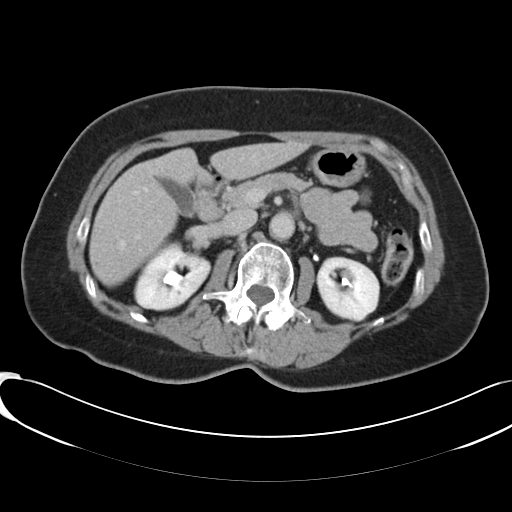
[im 22/33  soft-tissue]
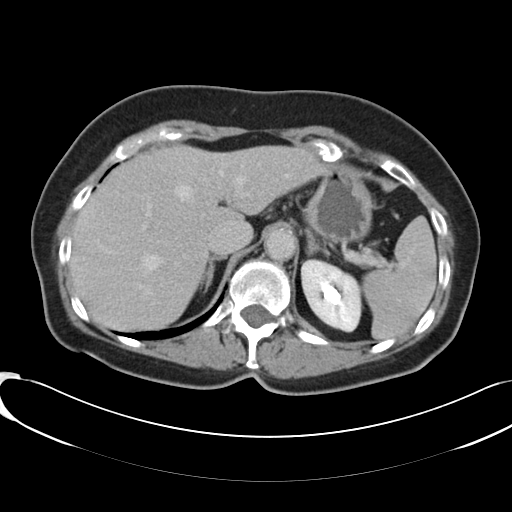

[12 of 46 positions shown; findings below may reference images not displayed]

CT angiogram abdomen with contrast:

Multidetector helical CT of the abdomen  was obtained after 125  ml Omnipaque
300  IV. CT multiplanar reconstructions were rendered to evaluate the vascular
anatomy.
No previous available for comparison. The noncontrast scan shows patchy
aortoiliac atheromatous calcifications. Visualized lung bases clear.
Intramuscular lipoma in the right lateral chest wall musculature.

CTA demonstrates mild atheromatous irregularity of the abdominal aorta without
dissection or stenosis. Partially calcified plaque at the origins of the celiac
axis and SMA without significant stenosis. There is classic distal branching
anatomy. Single left renal artery, unremarkable. Single right renal artery,
unremarkable. Inferior mesenteric artery remains patent. There is mild plaque
throughout the common iliac arteries and proximal external iliac arteries
without significant stenosis or other focal lesion.  Venous phase shows patency
of hepatic veins, portal vein, renal veins, IVC. 
Unremarkable arterial phase evaluation of kidneys, spleen, adrenal glands,
pancreas, and visualized portions of small bowel and colon.There is a
nonenhancing 5 mm lesion in the medial left hepatic segment possibly cyst. No
other liver lesion. Spondylitic and postoperative changes in the lumbar spine.
IMPRESSION: 1. Negative for proximal visceral arterial stenosis or other  vascular lesion. 
2. Probable small hepatic cyst.
3. Mild aortoiliac atheromatous disease without stenosis.

## 2008-11-01 ENCOUNTER — Ambulatory Visit: Payer: Self-pay | Admitting: Specialist

## 2008-12-28 ENCOUNTER — Telehealth: Payer: Self-pay | Admitting: Family Medicine

## 2009-01-01 ENCOUNTER — Ambulatory Visit: Payer: Self-pay | Admitting: Family Medicine

## 2009-01-03 ENCOUNTER — Encounter: Payer: Self-pay | Admitting: Family Medicine

## 2009-01-15 ENCOUNTER — Ambulatory Visit: Payer: Self-pay | Admitting: Gastroenterology

## 2009-01-15 DIAGNOSIS — R1084 Generalized abdominal pain: Secondary | ICD-10-CM

## 2009-01-15 DIAGNOSIS — K5900 Constipation, unspecified: Secondary | ICD-10-CM | POA: Insufficient documentation

## 2009-01-16 LAB — CONVERTED CEMR LAB
ALT: 23 units/L (ref 0–35)
AST: 26 units/L (ref 0–37)
Albumin: 4 g/dL (ref 3.5–5.2)
Alkaline Phosphatase: 95 units/L (ref 39–117)
Amylase: 89 units/L (ref 27–131)
BUN: 14 mg/dL (ref 6–23)
Basophils Absolute: 0 10*3/uL (ref 0.0–0.1)
Basophils Relative: 0.2 % (ref 0.0–3.0)
Bilirubin, Direct: 0.2 mg/dL (ref 0.0–0.3)
CO2: 33 meq/L — ABNORMAL HIGH (ref 19–32)
Calcium: 9.8 mg/dL (ref 8.4–10.5)
Chloride: 103 meq/L (ref 96–112)
Creatinine, Ser: 0.8 mg/dL (ref 0.4–1.2)
Eosinophils Absolute: 0.2 10*3/uL (ref 0.0–0.7)
Eosinophils Relative: 2.5 % (ref 0.0–5.0)
Ferritin: 55.7 ng/mL (ref 10.0–291.0)
Folate: 9.4 ng/mL
GFR calc non Af Amer: 76.31 mL/min (ref >60)
Glucose, Bld: 92 mg/dL (ref 70–99)
HCT: 45.8 % (ref 36.0–46.0)
Hemoglobin: 15.8 g/dL — ABNORMAL HIGH (ref 12.0–15.0)
Iron: 65 ug/dL (ref 42–145)
Lipase: 38 units/L (ref 11.0–59.0)
Lymphocytes Relative: 32.9 % (ref 12.0–46.0)
Lymphs Abs: 2.5 10*3/uL (ref 0.7–4.0)
MCHC: 34.4 g/dL (ref 30.0–36.0)
MCV: 92.1 fL (ref 78.0–100.0)
Monocytes Absolute: 0.4 10*3/uL (ref 0.1–1.0)
Monocytes Relative: 4.8 % (ref 3.0–12.0)
Neutro Abs: 4.5 10*3/uL (ref 1.4–7.7)
Neutrophils Relative %: 59.6 % (ref 43.0–77.0)
Platelets: 210 10*3/uL (ref 150.0–400.0)
Potassium: 4.8 meq/L (ref 3.5–5.1)
RBC: 4.97 M/uL (ref 3.87–5.11)
RDW: 12.4 % (ref 11.5–14.6)
Saturation Ratios: 15.1 % — ABNORMAL LOW (ref 20.0–50.0)
Sed Rate: 10 mm/hr (ref 0–22)
Sodium: 141 meq/L (ref 135–145)
TSH: 1.41 microintl units/mL (ref 0.35–5.50)
Total Bilirubin: 0.5 mg/dL (ref 0.3–1.2)
Total Protein: 7.8 g/dL (ref 6.0–8.3)
Transferrin: 306.7 mg/dL (ref 212.0–360.0)
Vitamin B-12: 224 pg/mL (ref 211–911)
WBC: 7.6 10*3/uL (ref 4.5–10.5)

## 2009-01-24 ENCOUNTER — Ambulatory Visit: Payer: Self-pay | Admitting: Gastroenterology

## 2009-01-24 ENCOUNTER — Telehealth: Payer: Self-pay | Admitting: Family Medicine

## 2009-01-30 ENCOUNTER — Telehealth: Payer: Self-pay | Admitting: Family Medicine

## 2009-01-31 ENCOUNTER — Ambulatory Visit: Payer: Self-pay | Admitting: Family Medicine

## 2009-01-31 ENCOUNTER — Telehealth: Payer: Self-pay | Admitting: Family Medicine

## 2009-01-31 DIAGNOSIS — E538 Deficiency of other specified B group vitamins: Secondary | ICD-10-CM

## 2009-02-07 ENCOUNTER — Ambulatory Visit: Payer: Self-pay | Admitting: Family Medicine

## 2009-02-12 ENCOUNTER — Encounter: Payer: Self-pay | Admitting: Gastroenterology

## 2009-03-12 ENCOUNTER — Ambulatory Visit: Payer: Self-pay | Admitting: Family Medicine

## 2009-04-16 ENCOUNTER — Ambulatory Visit: Payer: Self-pay | Admitting: Family Medicine

## 2009-05-17 ENCOUNTER — Ambulatory Visit: Payer: Self-pay | Admitting: Family Medicine

## 2009-06-06 ENCOUNTER — Ambulatory Visit: Payer: Self-pay | Admitting: Specialist

## 2009-06-12 ENCOUNTER — Encounter: Payer: Self-pay | Admitting: Family Medicine

## 2009-06-14 ENCOUNTER — Ambulatory Visit: Payer: Self-pay | Admitting: Family Medicine

## 2009-07-16 ENCOUNTER — Ambulatory Visit: Payer: Self-pay | Admitting: Family Medicine

## 2009-07-24 ENCOUNTER — Telehealth: Payer: Self-pay | Admitting: Family Medicine

## 2009-08-02 ENCOUNTER — Telehealth: Payer: Self-pay | Admitting: Family Medicine

## 2009-08-02 ENCOUNTER — Inpatient Hospital Stay: Payer: Self-pay | Admitting: Internal Medicine

## 2009-08-02 DIAGNOSIS — F17200 Nicotine dependence, unspecified, uncomplicated: Secondary | ICD-10-CM

## 2009-08-02 DIAGNOSIS — Z9289 Personal history of other medical treatment: Secondary | ICD-10-CM

## 2009-08-02 HISTORY — DX: Personal history of other medical treatment: Z92.89

## 2009-08-04 ENCOUNTER — Encounter: Payer: Self-pay | Admitting: Family Medicine

## 2009-08-15 ENCOUNTER — Ambulatory Visit: Payer: Self-pay | Admitting: Family Medicine

## 2009-08-16 ENCOUNTER — Encounter: Payer: Self-pay | Admitting: Family Medicine

## 2009-08-19 IMAGING — CT CT CHEST W/ CM
1 of 2 series · 14 of 32 positions shown, 18 images · IV contrast (agent unspecified)
Comparison: none

REASON FOR EXAM: cough hempotysis
COMMENTS:

PROCEDURE:     CT  - CT CHEST WITH CONTRAST  - May 08, 2008  [DATE]
RESULT:     Comparison: 02/16/2006
INDICATION: Hemoptysis
TECHNIQUE: Multiple axial images of the chest are obtained with 100 mL of
Fsovue-NSB intravenous contrast.

[Series 3: lung windows · axial · 0.62mm/px · z∈[+213,+493]mm · 14 of 68 slices shown, 18 images]
[im 6/68  mediastinal]
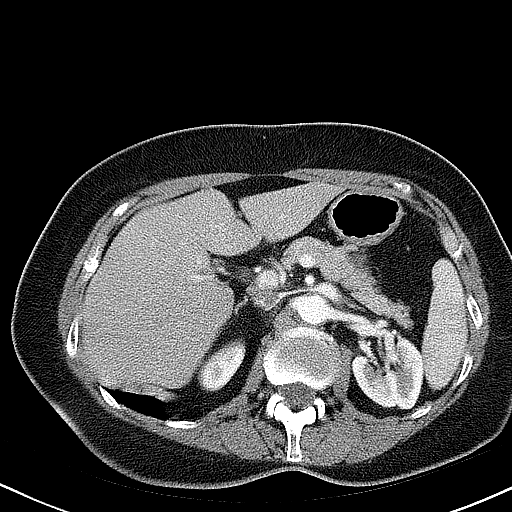
[im 6/68  lung]
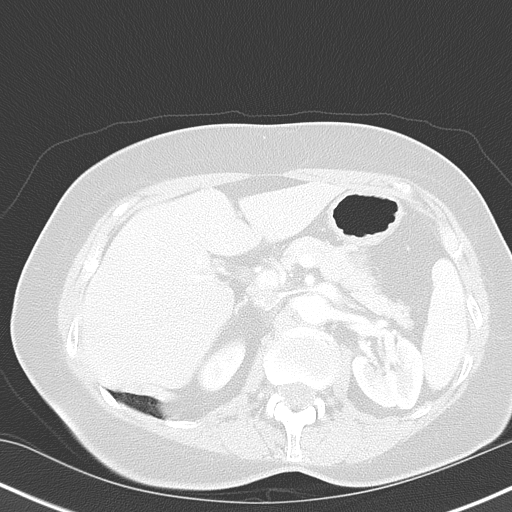
[im 11/68  lung]
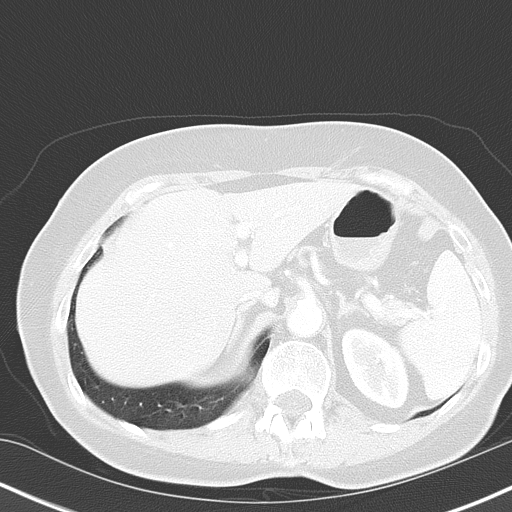
[im 16/68  lung]
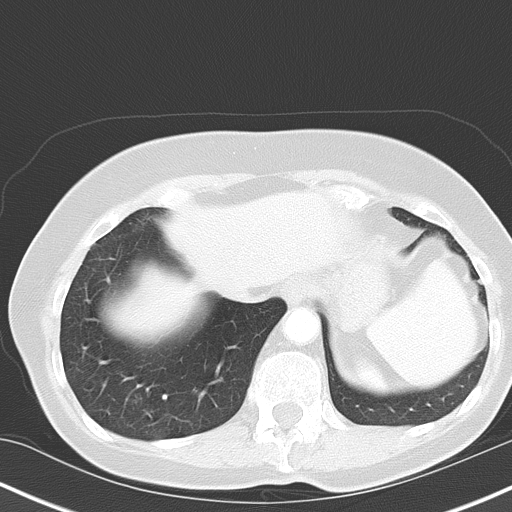
[im 21/68  lung]
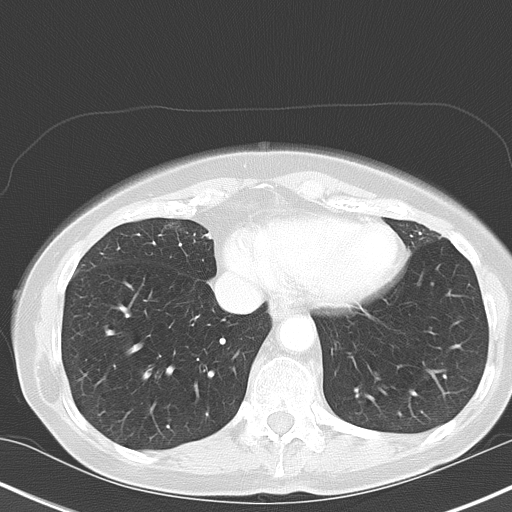
[im 26/68  mediastinal]
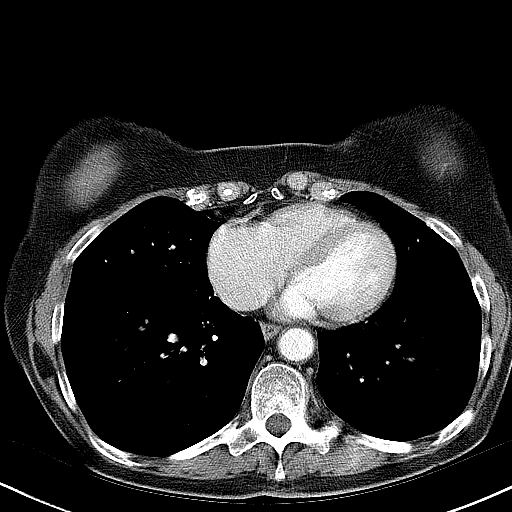
[im 26/68  lung]
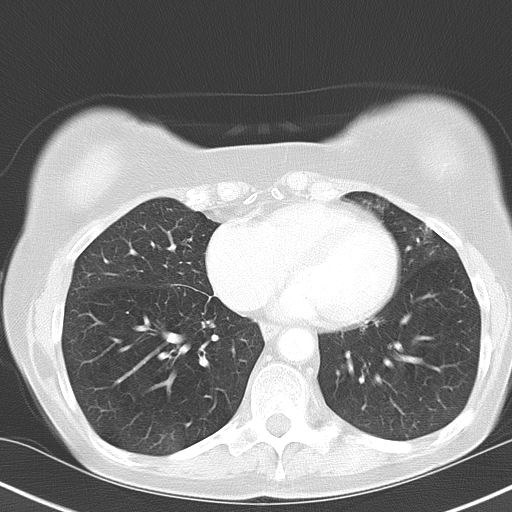
[im 31/68  lung]
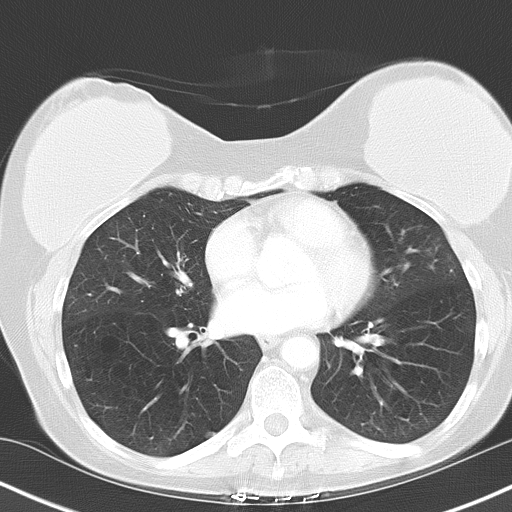
[im 32/68  lung]
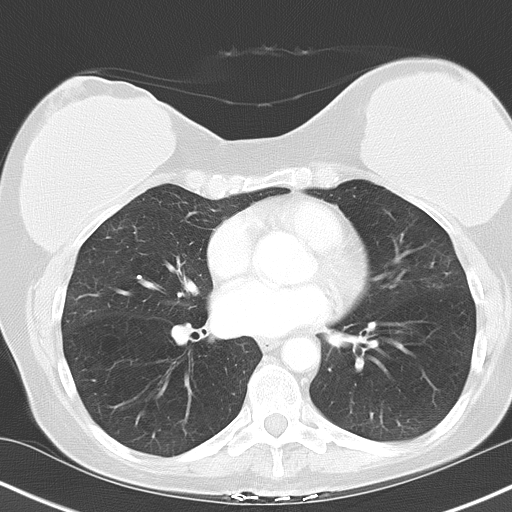
[im 34/68  lung]
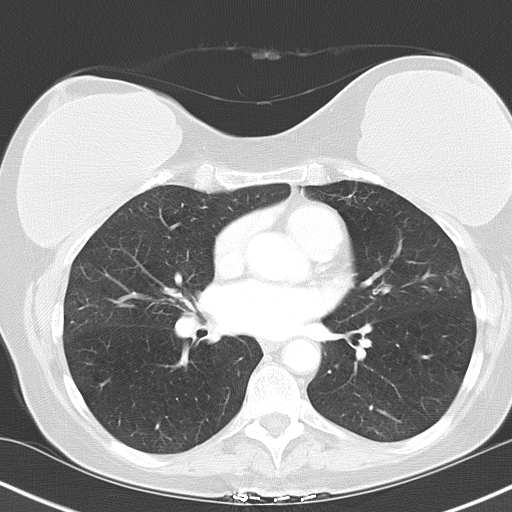
[im 37/68  mediastinal]
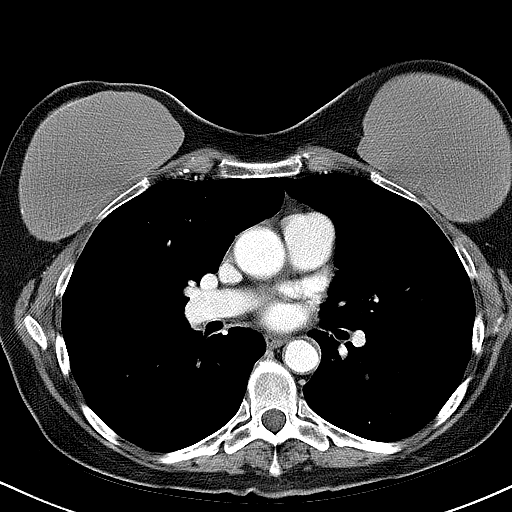
[im 37/68  lung]
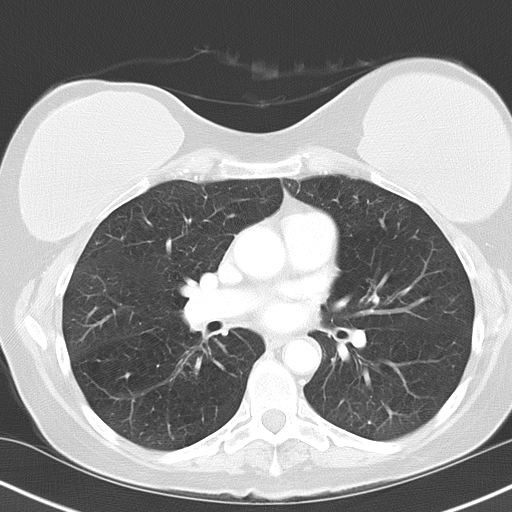
[im 42/68  lung]
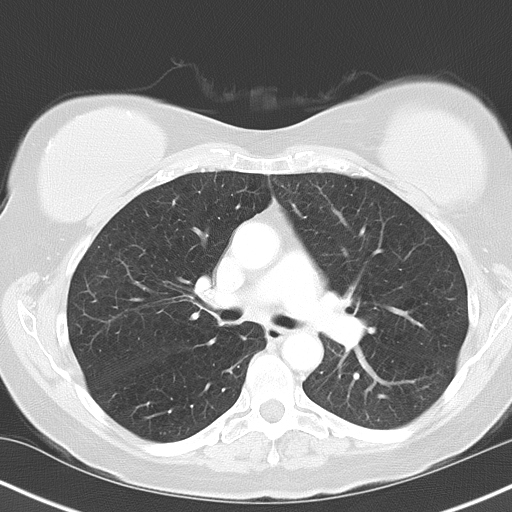
[im 47/68  lung]
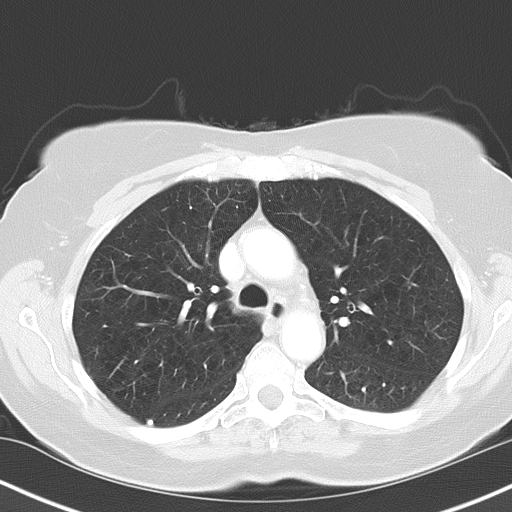
[im 52/68  lung]
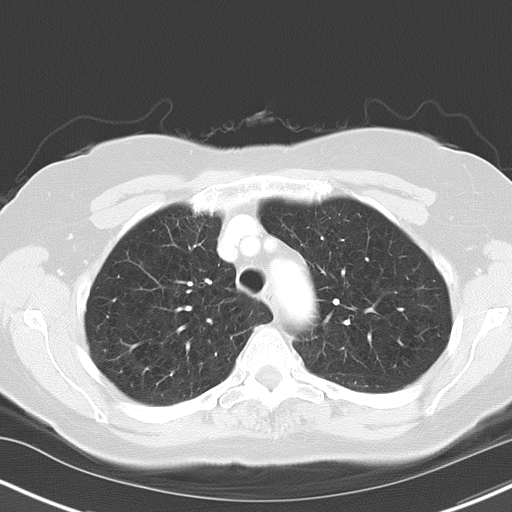
[im 57/68  mediastinal]
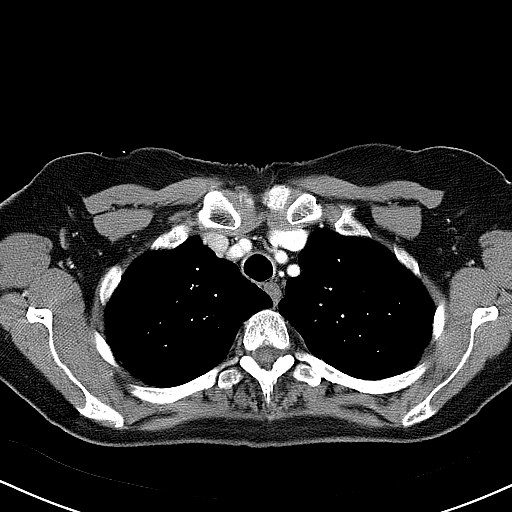
[im 57/68  lung]
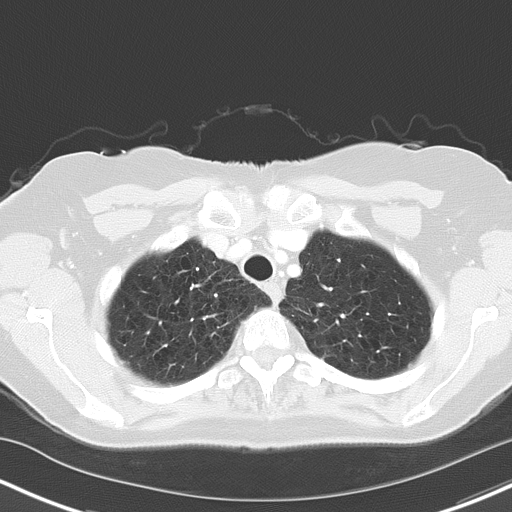
[im 62/68  lung]
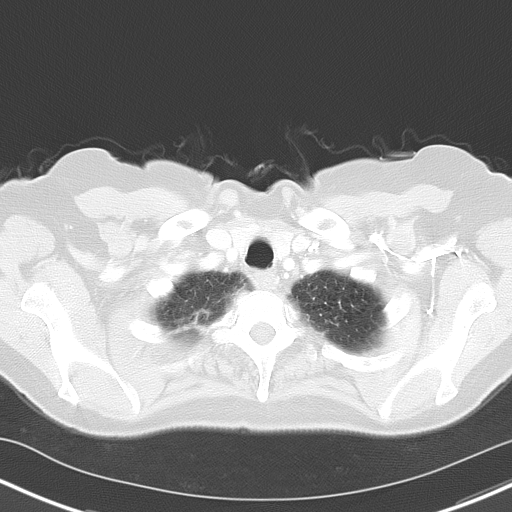

[14 of 32 positions shown; findings below may reference images not displayed]

FINDINGS: The central airways are patent. There is severe bilateral emphysematous
change most significant in the bilateral upper lobes. There multiple
calcified pulmonary nodules bilaterally likely representing sequela of prior
granulomatous disease. There are 2 discrete subpleural 3 mm pulmonary
nodules which are unchanged in the prior exam. There is a small area of
reticular nodular airspace disease in the lingula which is new compared with
the prior examination and may represent an infiltrate. There is no pleural
effusion or pneumothorax.

There are no pathologically enlarged axillary, hilar, or mediastinal lymph
nodes.

The heart size is normal. There is no pericardial effusion. The thoracic
aorta is normal in caliber.

Review of bone windows demonstrates no focal lytic or sclerotic lesions.

Limited noncontrast images of the upper abdomen were obtained. The adrenal
glands appear normal. There is a small hypodensity adjacent to the falciform
ligament measuring 15 mm and fluid attenuation likely representing a cyst or
biliary hamartoma. The remainder of the visualized abdominal organs are
unremarkable.
IMPRESSION: There is a small area of reticular nodular airspace disease in the lingula
which is new compared with the prior examination and may represent an
infiltrate.

## 2009-08-19 IMAGING — CR DG CHEST 2V
1 series · 2 of 2 positions shown · non-contrast
Comparison: none

REASON FOR EXAM: Shortness of Breath/  Pt in WR
COMMENTS:   LMP: Post Hysterectomy

[Series 1: view not recorded · 0.17mm/px · 2 of 2 slices shown]
[im 1/2]
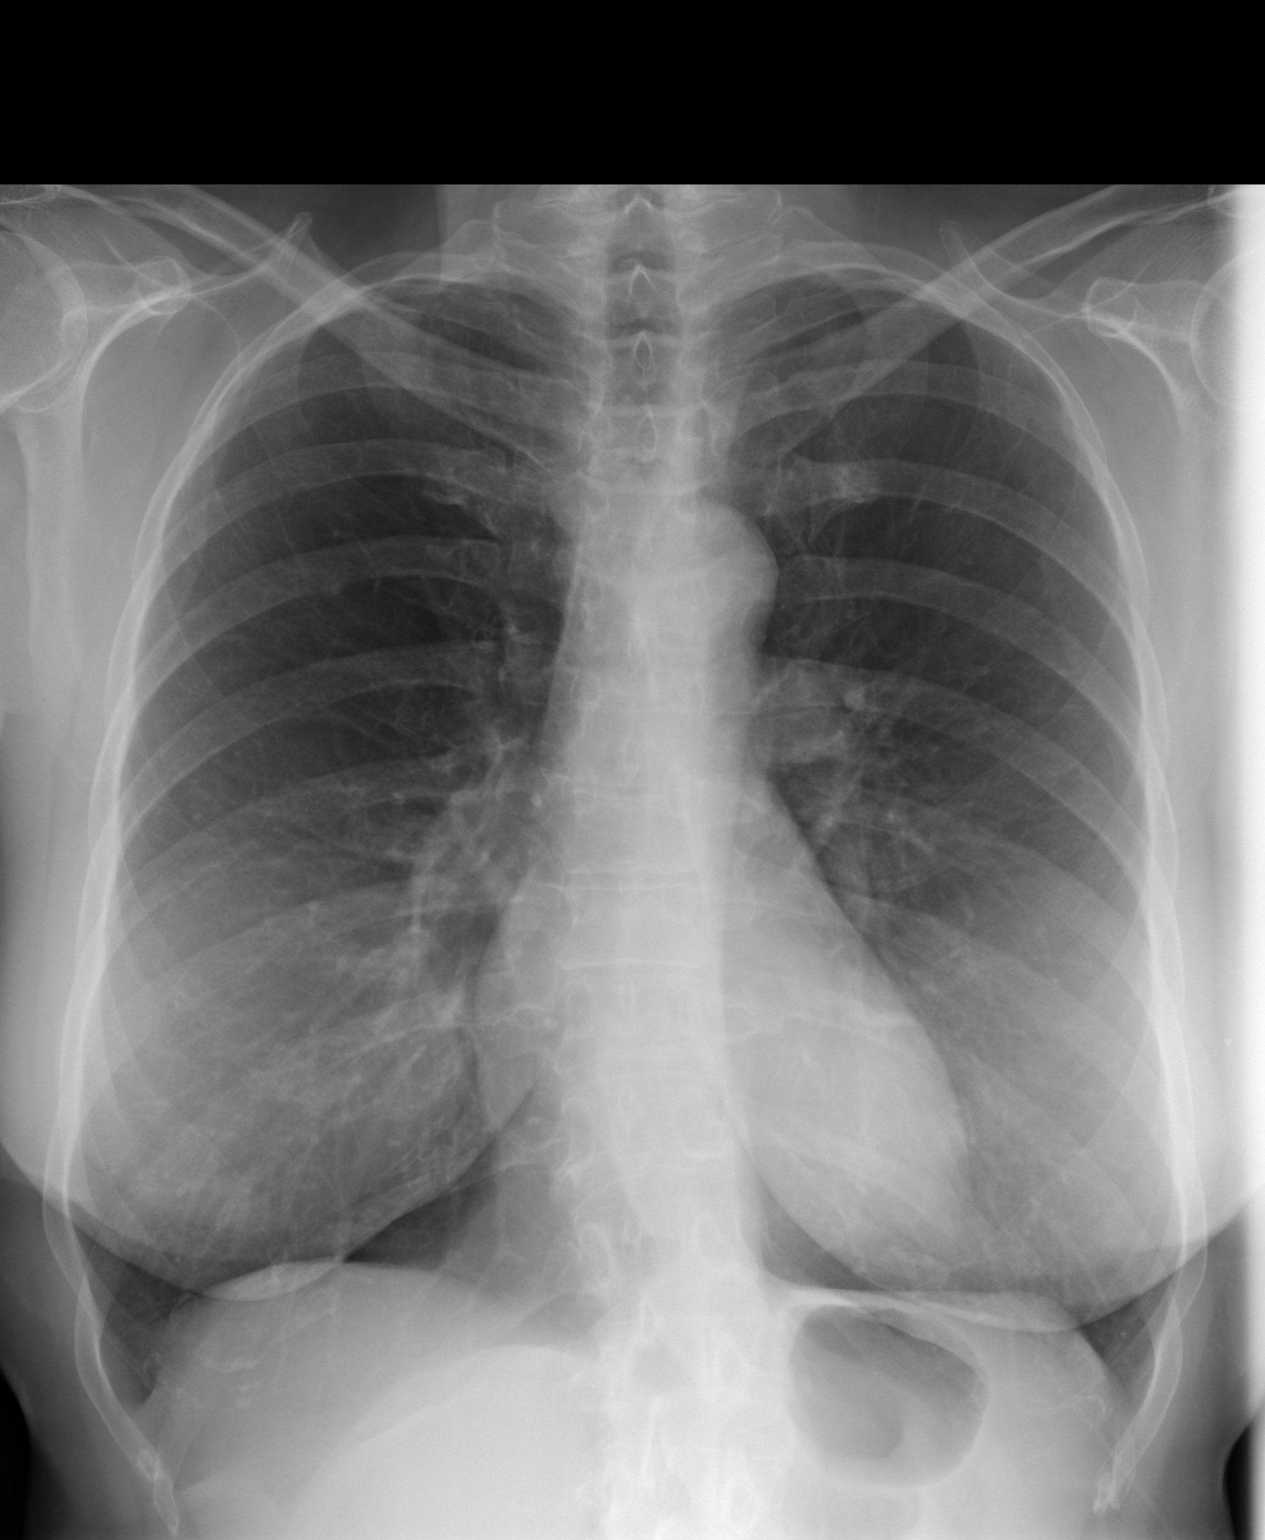
[im 2/2]
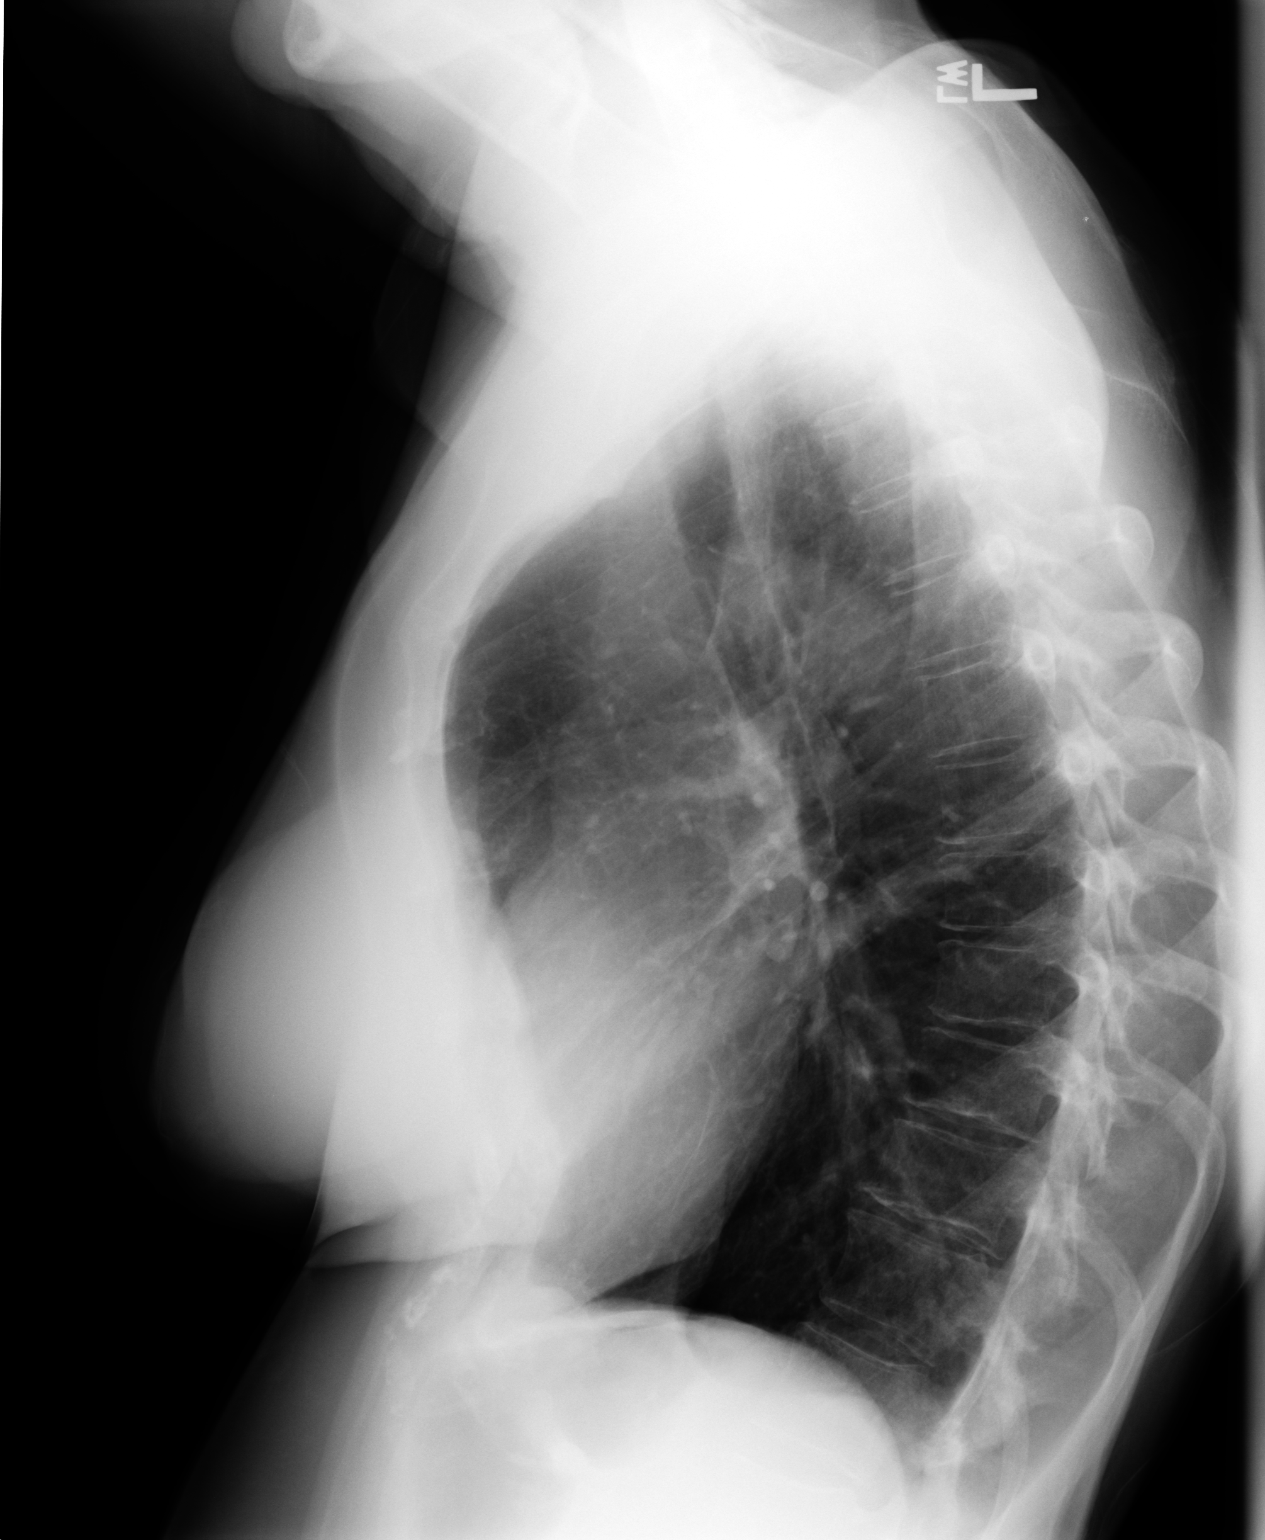

[2 of 2 positions shown; findings below may reference images not displayed]

PROCEDURE:     DXR - DXR CHEST PA (OR AP) AND LATERAL  - May 08, 2008 [DATE]

RESULT:     Comparison is made to a study 15 February, 2006.

The lungs are mildly hyperinflated. There is no focal infiltrate. The heart
and pulmonary vascularity are within the limits of normal. I do not see
evidence of a pleural effusion.
IMPRESSION: There is hyperinflation consistent with COPD. I do not see
evidence of CHF nor of pneumonia. Follow-up imaging is available if the
patient's symptoms warrant this.

## 2009-08-21 ENCOUNTER — Ambulatory Visit: Payer: Self-pay | Admitting: Family Medicine

## 2009-08-21 LAB — CONVERTED CEMR LAB
ALT: 18 units/L (ref 0–35)
AST: 24 units/L (ref 0–37)
Albumin: 4 g/dL (ref 3.5–5.2)
Alkaline Phosphatase: 71 units/L (ref 39–117)
BUN: 11 mg/dL (ref 6–23)
Basophils Absolute: 0.1 10*3/uL (ref 0.0–0.1)
Basophils Relative: 0.9 % (ref 0.0–3.0)
Bilirubin, Direct: 0.1 mg/dL (ref 0.0–0.3)
CO2: 32 meq/L (ref 19–32)
Calcium: 10.4 mg/dL (ref 8.4–10.5)
Chloride: 105 meq/L (ref 96–112)
Cholesterol: 136 mg/dL (ref 0–200)
Creatinine, Ser: 0.9 mg/dL (ref 0.4–1.2)
Eosinophils Absolute: 0.2 10*3/uL (ref 0.0–0.7)
Eosinophils Relative: 3.1 % (ref 0.0–5.0)
GFR calc non Af Amer: 71.02 mL/min (ref >60)
Glucose, Bld: 85 mg/dL (ref 70–99)
HCT: 44.7 % (ref 36.0–46.0)
HDL: 41.5 mg/dL (ref >39.00)
Hemoglobin: 15.4 g/dL — ABNORMAL HIGH (ref 12.0–15.0)
Iron: 133 ug/dL (ref 42–145)
LDL Cholesterol: 67 mg/dL (ref 0–99)
Lymphocytes Relative: 32.1 % (ref 12.0–46.0)
Lymphs Abs: 2 10*3/uL (ref 0.7–4.0)
MCHC: 34.5 g/dL (ref 30.0–36.0)
MCV: 91.8 fL (ref 78.0–100.0)
Monocytes Absolute: 0.4 10*3/uL (ref 0.1–1.0)
Monocytes Relative: 6.2 % (ref 3.0–12.0)
Neutro Abs: 3.6 10*3/uL (ref 1.4–7.7)
Neutrophils Relative %: 57.7 % (ref 43.0–77.0)
Platelets: 220 10*3/uL (ref 150.0–400.0)
Potassium: 5.6 meq/L — ABNORMAL HIGH (ref 3.5–5.1)
RBC: 4.87 M/uL (ref 3.87–5.11)
RDW: 13.2 % (ref 11.5–14.6)
Sodium: 144 meq/L (ref 135–145)
TSH: 1.45 microintl units/mL (ref 0.35–5.50)
Total Bilirubin: 0.5 mg/dL (ref 0.3–1.2)
Total CHOL/HDL Ratio: 3
Total Protein: 7.2 g/dL (ref 6.0–8.3)
Triglycerides: 139 mg/dL (ref 0.0–149.0)
VLDL: 27.8 mg/dL (ref 0.0–40.0)
Vitamin B-12: 481 pg/mL (ref 211–911)
WBC: 6.2 10*3/uL (ref 4.5–10.5)

## 2009-08-22 LAB — CONVERTED CEMR LAB: Vit D, 25-Hydroxy: 27 ng/mL — ABNORMAL LOW (ref 30–89)

## 2009-08-29 ENCOUNTER — Ambulatory Visit: Payer: Self-pay | Admitting: Family Medicine

## 2009-08-30 IMAGING — CR DG CHEST 2V
1 series · 2 of 2 positions shown · non-contrast
Comparison: none

REASON FOR EXAM: Shortness of Breath
COMMENTS:

[Series 1: view not recorded · 0.17mm/px · 2 of 2 slices shown]
[im 1/2]
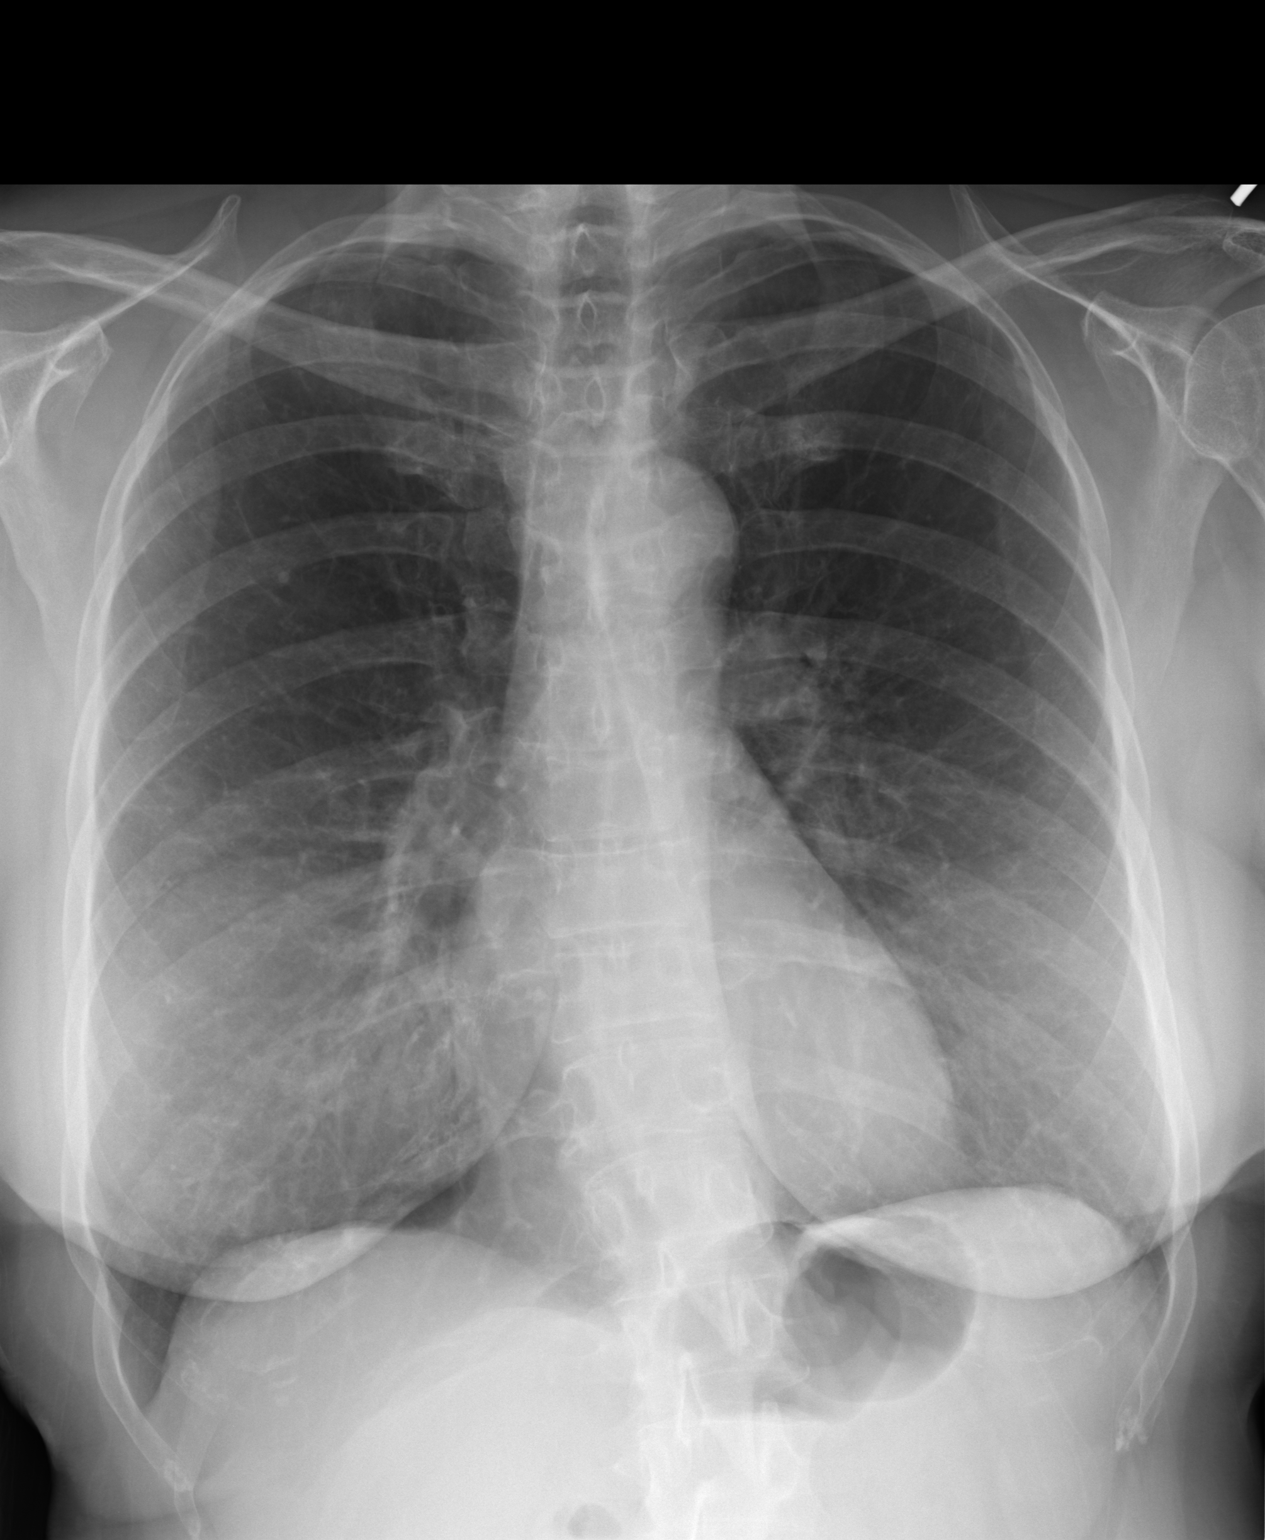
[im 2/2]
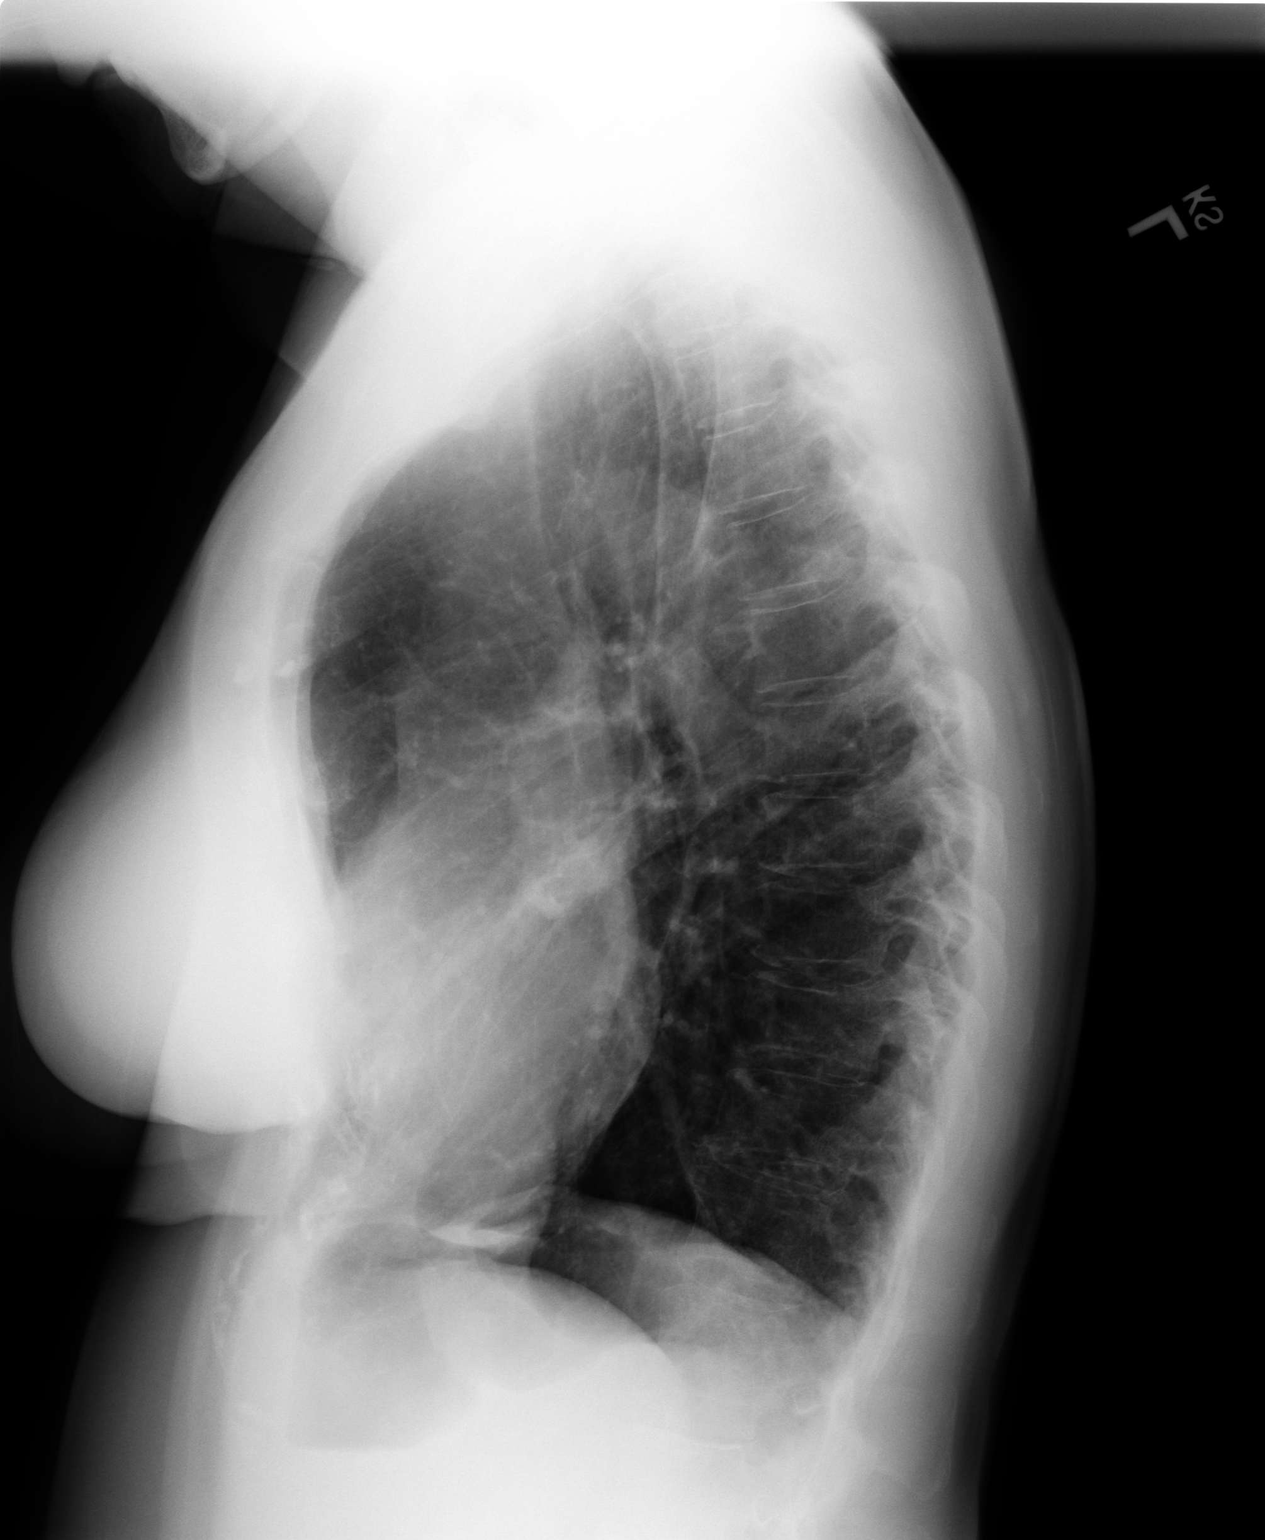

[2 of 2 positions shown; findings below may reference images not displayed]

PROCEDURE:     DXR - DXR CHEST PA (OR AP) AND LATERAL  - May 19, 2008  [DATE]

RESULT:     Comparison is made to the study of 05/08/2008. The lungs are
hyperinflated. The lungs are clear. The heart and pulmonary vessels are
normal. The bony and mediastinal structures are unremarkable. There is no
effusion. There is no pneumothorax or evidence of congestive failure.
IMPRESSION: No acute cardiopulmonary disease. Stable appearance.
Hyperinflation consistent with COPD.

## 2009-09-17 ENCOUNTER — Ambulatory Visit: Payer: Self-pay | Admitting: Family Medicine

## 2009-10-17 ENCOUNTER — Ambulatory Visit: Payer: Self-pay | Admitting: Family Medicine

## 2009-11-04 ENCOUNTER — Encounter (INDEPENDENT_AMBULATORY_CARE_PROVIDER_SITE_OTHER): Payer: Self-pay | Admitting: *Deleted

## 2009-11-19 ENCOUNTER — Ambulatory Visit: Payer: Self-pay | Admitting: Family Medicine

## 2009-12-11 ENCOUNTER — Ambulatory Visit: Payer: Self-pay | Admitting: Family Medicine

## 2009-12-11 DIAGNOSIS — D179 Benign lipomatous neoplasm, unspecified: Secondary | ICD-10-CM | POA: Insufficient documentation

## 2009-12-15 ENCOUNTER — Emergency Department: Payer: Self-pay | Admitting: Emergency Medicine

## 2009-12-25 ENCOUNTER — Ambulatory Visit: Payer: Self-pay | Admitting: Family Medicine

## 2010-01-13 ENCOUNTER — Ambulatory Visit: Payer: Self-pay | Admitting: Specialist

## 2010-01-15 ENCOUNTER — Telehealth: Payer: Self-pay | Admitting: Family Medicine

## 2010-01-28 ENCOUNTER — Ambulatory Visit: Payer: Self-pay | Admitting: Family Medicine

## 2010-02-28 ENCOUNTER — Ambulatory Visit: Payer: Self-pay | Admitting: Family Medicine

## 2010-04-04 ENCOUNTER — Ambulatory Visit
Admission: RE | Admit: 2010-04-04 | Discharge: 2010-04-04 | Payer: Self-pay | Source: Home / Self Care | Attending: Family Medicine | Admitting: Family Medicine

## 2010-05-01 NOTE — Letter (Signed)
Summary: [facility]  [facility]   Imported By: Lanelle Bal 07/25/2009 13:20:08  _____________________________________________________________________  External Attachment:    Type:   Image     Comment:   External Document

## 2010-05-01 NOTE — Letter (Signed)
Summary: Notification of Pt Participation in [facility]  Notification of Pt Participation in [facility] Disease Management Program   Imported By: Beau Fanny 09/02/2009 14:06:27  _____________________________________________________________________  External Attachment:    Type:   Image     Comment:   External Document

## 2010-05-01 NOTE — Letter (Signed)
Summary: Nadara Eaton letter  [place] [facility]  [address] [place], [place] [address]   Phone: [phone]  Fax: [phone]       11/04/2009 MRN: [id number]  [facility] [address] [place], [place]  [address]  Dear Ms. [facility] [place] and [place] announce the retirement of Arta Silence, M.D., from full-time practice at the [facility] office effective September 26, 2009 and his plans of returning part-time.  It is important to Dr. Hetty Ely and to our practice that you understand that [facility] has seven physicians in our office for your health care needs.  We will continue to offer the same exceptional care that you have today.    Dr. Hetty Ely has spoken to many of you about his plans for retirement and returning part-time in the fall.   We will continue to work with you through the transition to schedule appointments for you in the office and meet the high standards that  is committed to.   Again, it is with great pleasure that we share the news that Dr. Hetty Ely will return to [facility] at [facility] in October of 2011 with a reduced schedule.    If you have any questions, or would like to request an appointment with one of our physicians, please call us at [phone] and press the option for Scheduling an appointment.  We take pleasure in providing you with excellent patient care and look forward to seeing you at your next office visit.  Our [facility] Physicians are:  Tillman Abide, M.D. Laurita Quint, M.D. Roxy Manns, M.D. Kerby Nora, M.D. Hannah Beat, M.D. Ruthe Mannan, M.D. We proudly welcomed Raechel Ache, M.D. and Eustaquio Boyden, M.D. to the practice in July/August 2011.  Sincerely,   [facility]

## 2010-05-01 NOTE — Progress Notes (Signed)
Summary: Alprazolam  Phone Note Refill Request Message from:  Pharmacy on January 15, 2010 1:44 PM  Refills Requested: Medication #1:  ALPRAZOLAM 0.5 MG  TABS Take 1 tablet by mouth two times a day [facility]   Last Fill Date:  12/15/2009   Pharmacy Phone:  [phone]   Method Requested: Telephone to Pharmacy Initial call taken by: Delilah Shan CMA Duncan Dull),  January 15, 2010 1:44 PM  Follow-up for Phone Call        please call in.  Follow-up by: Crawford Givens MD,  January 15, 2010 1:49 PM  Additional Follow-up for Phone Call Additional follow up Details #1::        Medication phoned to pharmacy.  Additional Follow-up by: Delilah Shan CMA Duncan Dull),  January 15, 2010 2:13 PM    Prescriptions: ALPRAZOLAM 0.5 MG  TABS (ALPRAZOLAM) Take 1 tablet by mouth two times a day  #60 Each x 5   Entered and Authorized by:   Crawford Givens MD   Signed by:   Crawford Givens MD on 01/15/2010   Method used:   Telephoned to ...       [facility] (retail)       [address]       [place], [place]  [address]       Ph: [phone]       Fax: [phone]   RxID:   [id number]

## 2010-05-01 NOTE — Assessment & Plan Note (Signed)
Summary: TRUE [place] B12/RBH   Nurse Visit   Allergies: 1)  Zoloft (Sertraline Hcl)  Medication Administration  Injection # 1:    Medication: Vit B12 1000 mcg    Diagnosis: B12 DEFICIENCY (ICD-266.2)    Route: IM    Site: R deltoid    Exp Date: 03/30/2011    Lot #: [id number]    Mfr: [employer]    Patient tolerated injection without complications    Given by: Delilah Shan CMA (AAMA) (September 17, 2009 8:25 AM)  Orders Added: 1)  Admin of Therapeutic Inj  intramuscular or subcutaneous [96372] 2)  Vit B12 1000 mcg [J3420]

## 2010-05-01 NOTE — Assessment & Plan Note (Signed)
Summary: B-12/JRR   Nurse Visit   Allergies: 1)  Zoloft (Sertraline Hcl)  Medication Administration  Injection # 1:    Medication: Vit B12 1000 mcg    Diagnosis: B12 DEFICIENCY (ICD-266.2)    Route: IM    Site: L deltoid    Exp Date: 01/2011    Lot #: [id number]    Mfr: [employer]ent    Patient tolerated injection without complications    Given by: Lowella Petties CMA (Aug 15, 2009 8:41 AM)  Orders Added: 1)  Vit B12 1000 mcg [J3420] 2)  Admin of Therapeutic Inj  intramuscular or subcutaneous [96372]   Medication Administration  Injection # 1:    Medication: Vit B12 1000 mcg    Diagnosis: B12 DEFICIENCY (ICD-266.2)    Route: IM    Site: L deltoid    Exp Date: 01/2011    Lot #: [id number]    Mfr: [employer]ent    Patient tolerated injection without complications    Given by: Lowella Petties CMA (Aug 15, 2009 8:41 AM)  Orders Added: 1)  Vit B12 1000 mcg [J3420] 2)  Admin of Therapeutic Inj  intramuscular or subcutaneous [[id number]]

## 2010-05-01 NOTE — Assessment & Plan Note (Signed)
Summary: Darolyn Double B12/RBH   Nurse Visit   Allergies: 1)  Zoloft (Sertraline Hcl)  Medication Administration  Injection # 1:    Medication: Vit B12 1000 mcg    Diagnosis: B12 DEFICIENCY (ICD-266.2)    Route: IM    Site: R deltoid    Exp Date: 07/28/2011    Lot #: [id number]    Mfr: [employer]ent    Patient tolerated injection without complications    Given by: Delilah Shan CMA (AAMA) (January 28, 2010 8:32 AM)  Orders Added: 1)  Admin of Therapeutic Inj  intramuscular or subcutaneous [96372] 2)  Vit B12 1000 mcg [J3420]   Medication Administration  Injection # 1:    Medication: Vit B12 1000 mcg    Diagnosis: B12 DEFICIENCY (ICD-266.2)    Route: IM    Site: R deltoid    Exp Date: 07/28/2011    Lot #: [id number]    Mfr: [employer]ent    Patient tolerated injection without complications    Given by: Delilah Shan CMA (AAMA) (January 28, 2010 8:32 AM)  Orders Added: 1)  Admin of Therapeutic Inj  intramuscular or subcutaneous [96372] 2)  Vit B12 1000 mcg [J3420]

## 2010-05-01 NOTE — Assessment & Plan Note (Signed)
Summary: DUNCAN B12/RBH   Nurse Visit   Allergies: 1)  Zoloft (Sertraline Hcl)  Medication Administration  Injection # 1:    Medication: Vit B12 1000 mcg    Diagnosis: B12 DEFICIENCY (ICD-266.2)    Route: IM    Site: R deltoid    Exp Date: 09/2011    Lot #: [id number]    Mfr: [employer]ent    Patient tolerated injection without complications    Given by: Lowella Petties CMA, AAMA (February 28, 2010 8:56 AM)  Orders Added: 1)  Vit B12 1000 mcg [J3420] 2)  Admin of Therapeutic Inj  intramuscular or subcutaneous [96372]   Medication Administration  Injection # 1:    Medication: Vit B12 1000 mcg    Diagnosis: B12 DEFICIENCY (ICD-266.2)    Route: IM    Site: R deltoid    Exp Date: 09/2011    Lot #: [id number]    Mfr: [employer]ent    Patient tolerated injection without complications    Given by: Lowella Petties CMA, AAMA (February 28, 2010 8:56 AM)  Orders Added: 1)  Vit B12 1000 mcg [J3420] 2)  Admin of Therapeutic Inj  intramuscular or subcutaneous [[id number]]

## 2010-05-01 NOTE — Assessment & Plan Note (Signed)
Summary: DUNCAN B12/RBH   Nurse Visit   Allergies: 1)  Zoloft (Sertraline Hcl)  Medication Administration  Injection # 1:    Medication: Vit B12 1000 mcg    Diagnosis: B12 DEFICIENCY (ICD-266.2)    Route: IM    Site: L deltoid    Exp Date: 08/29/2011    Lot #: [id number]    Mfr: [employer]ent    Patient tolerated injection without complications    Given by: Sydell Axon LPN (December 25, 2009 8:33 AM)  Orders Added: 1)  Vit B12 1000 mcg [J3420] 2)  Admin of Therapeutic Inj  intramuscular or subcutaneous [96372]   Medication Administration  Injection # 1:    Medication: Vit B12 1000 mcg    Diagnosis: B12 DEFICIENCY (ICD-266.2)    Route: IM    Site: L deltoid    Exp Date: 08/29/2011    Lot #: [id number]    Mfr: [employer]ent    Patient tolerated injection without complications    Given by: Sydell Axon LPN (December 25, 2009 8:33 AM)  Orders Added: 1)  Vit B12 1000 mcg [J3420] 2)  Admin of Therapeutic Inj  intramuscular or subcutaneous [[id number]]

## 2010-05-01 NOTE — Letter (Signed)
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 [facility]) Hysterectomy w/ LSO Endometriosis (Dr Joanne Gavel) 1979 EGD Esoph Dilation 08/1998 CATH w/ Stent 90% RCA Lesion 01/22/1999 ETT nm l12/2000 ETT Cardiolite pos ETT neg Cardiolite 05/1999 HIDA abnml (Siegal) 07/1999 Abd U/S Anechoic region L Liver Lobe 07/1999 CATH 60% RCA o/w 20-30% Lesions 08/29/2001 ETT Cardiolite nml6/08/2001 Holter 24hr freq PVCs 05/2004 ETT Myoview nml 01/12/2005 ETT Myoview nml EF 83% 04/28/2006 Transabd and Endovag pelvic U/S   s/p Hyst, R Ovary not visualized h/o L Oophorectomy 05/10/2007 Colonoscopy Ischemic colitis Bx negative for colitis (Dr Jarold Motto) 05/23/2007 Mesenteric CT ANgiogram Mild atheromatous dz, No Stenosis 06/03/2007 HOSP [facility] CP R/O'D BLurry Vision Smoking  5/6-08/04/2009 CT Head w/o Mild age appropr Atrophy  08/02/2009 Carotid U/S Nml 08/02/2009 ETT Myoview Nml 08/03/2009 (08/10/2009 21:40) Added new observation of PRIMARY MD: Laurita Quint, MD (08/10/2009 21:40)       Past Surgical History:    Laminectomy/Disc Removal x 2 Lumbar 1976    Bilat Mastectomy due FCBD w/  implants 03/1976 [facility])    Hysterectomy w/ LSO Endometriosis (Dr Joanne Gavel) 1979    EGD Esoph Dilation 08/1998    CATH w/ Stent 90% RCA Lesion 01/22/1999    ETT nm l12/2000    ETT Cardiolite pos ETT neg Cardiolite 05/1999    HIDA abnml (Siegal) 07/1999    Abd U/S Anechoic region L Liver Lobe 07/1999    CATH 60% RCA o/w 20-30% Lesions 08/29/2001    ETT Cardiolite nml6/08/2001    Holter 24hr freq PVCs 05/2004    ETT  Myoview nml 01/12/2005    ETT Myoview nml EF 83% 04/28/2006    Transabd and Endovag pelvic U/S   s/p Hyst, R Ovary not visualized h/o L Oophorectomy 05/10/2007    Colonoscopy Ischemic colitis Bx negative for colitis (Dr Jarold Motto) 05/23/2007    Mesenteric CT ANgiogram Mild atheromatous dz, No Stenosis 06/03/2007    HOSP [facility] CP R/O'D BLurry Vision Smoking  5/6-08/04/2009    CT Head w/o Mild age appropr Atrophy  08/02/2009    Carotid U/S Nml 08/02/2009    ETT Myoview Nml 08/03/2009

## 2010-05-01 NOTE — Assessment & Plan Note (Signed)
Summary: B12  CYD   Nurse Visit   Allergies: 1)  Zoloft (Sertraline Hcl)  Medication Administration  Injection # 1:    Medication: Vit B12 1000 mcg    Diagnosis: B12 DEFICIENCY (ICD-266.2)    Route: IM    Site: L deltoid    Exp Date: 12/29/2010    Lot #: [id number]    Mfr: [employer]    Patient tolerated injection without complications    Given by: Sydell Axon LPN (April 16, 2009 8:29 AM)  Orders Added: 1)  Vit B12 1000 mcg [J3420] 2)  Admin of Therapeutic Inj  intramuscular or subcutaneous [[id number]]   Medication Administration  Injection # 1:    Medication: Vit B12 1000 mcg    Diagnosis: B12 DEFICIENCY (ICD-266.2)    Route: IM    Site: L deltoid    Exp Date: 12/29/2010    Lot #: [id number]    Mfr: [employer]    Patient tolerated injection without complications    Given by: Sydell Axon LPN (April 16, 2009 8:29 AM)  Orders Added: 1)  Vit B12 1000 mcg [J3420] 2)  Admin of Therapeutic Inj  intramuscular or subcutaneous [[id number]]

## 2010-05-01 NOTE — Assessment & Plan Note (Signed)
Summary: B-12/JRR   Nurse Visit   Allergies: 1)  Zoloft (Sertraline Hcl)  Medication Administration  Injection # 1:    Medication: Vit B12 1000 mcg    Diagnosis: B12 DEFICIENCY (ICD-266.2)    Route: IM    Site: L deltoid    Exp Date: 12/29/2011    Lot #: [id number]    Mfr: [employer]    Patient tolerated injection without complications    Given by: Linde Gillis CMA (AAMA) (April 04, 2010 8:42 AM)  Orders Added: 1)  Vit B12 1000 mcg [J3420] 2)  Admin of Therapeutic Inj  intramuscular or subcutaneous [[id number]]

## 2010-05-01 NOTE — Assessment & Plan Note (Signed)
Summary: health maintenance per Aristotle Lieb/dlo   Vital Signs:  Patient profile:   70 year old female Weight:      154.75 pounds Temp:     98.1 degrees F oral Pulse rate:   84 / minute Pulse rhythm:   regular BP sitting:   130 / 88  (left arm) Cuff size:   regular  Vitals Entered By: Sydell Axon LPN (August 29, 1608 10:21 AM) CC: 30 Minute checkup, has had a hysterectomy, had a colonoscopy 02/09 by Dr. Jarold Motto, last mammogram 03/ 03 and does not want to have another one   History of Present Illness: Pt here for followup. She does not see anyone regularly except Dr Jarold Motto for bowel chercks. She hasn't had a Comp Exam in quite a while. She will not have another mammo because of pressure and worrying about breaking her implant.   Preventive Screening-Counseling & Management  Alcohol-Tobacco     Alcohol drinks/day: 0     Smoking Status: quit     Packs/Day: 2003 50+pyh  Caffeine-Diet-Exercise     Caffeine use/day: 2     Does Patient Exercise: no  Problems Prior to Update: 1)  Nicotine Addiction  (ICD-305.1) 2)  B12 Deficiency  (ICD-266.2) 3)  Constipation  (ICD-564.00) 4)  Abdominal Pain, Generalized  (ICD-789.07) 5)  Plantar Wart, Left Foot  (ICD-078.12) 6)  Uri  (ICD-465.9) 7)  Abdominal Pain Right Lower Quadrant  (ICD-789.03) 8)  Abdominal Pain, Left Lower Quadrant  (ICD-789.04) 9)  Rectal Bleeding, Recurrent  (ICD-569.3) 10)  Headache  (ICD-784.0) 11)  Low Back Pain, Acute  (ICD-724.2) 12)  Hypertension  (ICD-401.9) 13)  Premature Ventricular Contractions, Frequent  (ICD-427.69) 14)  Gerd  (ICD-530.81) 15)  Coronary Artery Disease  (ICD-414.00) 16)  COPD  (ICD-496) 17)  Menopausal Syndrome  (ICD-627.2) 18)  Fibrocystic Breast Disease S/p Mastwectomies w/ Implants  (ICD-610.1)  Medications Prior to Update: 1)  Toprol Xl 25 Mg  Tb24 (Metoprolol Succinate) .... Take 1 Tablet By Mouth Once A Day 2)  Advair Diskus 100-50 Mcg/dose  Misc (Fluticasone-Salmeterol) .Marland Kitchen.. 1  Puff Two Times A Day 3)  Alprazolam 0.5 Mg  Tabs (Alprazolam) .... Take 1 Tablet By Mouth Two Times A Day 4)  Spiriva Handihaler 18 Mcg  Caps (Tiotropium Bromide Monohydrate) .... Take 1 Capsule By Mouth Once A Day 5)  Proair Hfa 108 (90 Base) Mcg/act  Aers (Albuterol Sulfate) .... 2 Puff Four Times A Day  As Needed 6)  Alber[employer]ons Buffered Aspirin 325 Mg  Tabs (Aspirin Buffered) .... Take 1 Tablet By Mouth Once A Day 7)  Zocor 10 Mg Tabs (Simvastatin) .... Take One By Mouth At Bedtime 8)  Cyanocobalamin 1000 Mcg/ml Inj Soln (Cyanocobalamin) .Marland Kitchen.. 1 Cc Im Weekly X 3 Weeks Then 1cc Im Q Month  Allergies: 1)  Zoloft (Sertraline Hcl)  Past History:  Past Medical History: Last updated: 07/19/2006 COPD, Severe  (07/29/1998) Coronary artery disease, Non Q-wave MI, Stent RCA (01/22/1999) GERD, Severe  (08/29/1998) Hypertension (03/30/2000)  Past Surgical History: Last updated: 08/10/2009 Laminectomy/Disc Removal x 2 Lumbar 1976 Bilat Mastectomy due FCBD w/  implants 03/1976 [facility]) Hysterectomy w/ LSO Endometriosis (Dr Joanne Gavel) 1979 EGD Esoph Dilation 08/1998 CATH w/ Stent 90% RCA Lesion 01/22/1999 ETT nm l12/2000 ETT Cardiolite pos ETT neg Cardiolite 05/1999 HIDA abnml (Siegal) 07/1999 Abd U/S Anechoic region L Liver Lobe 07/1999 CATH 60% RCA o/w 20-30% Lesions 08/29/2001 ETT Cardiolite nml6/08/2001 Holter 24hr freq PVCs 05/2004 ETT Myoview nml 01/12/2005 ETT Myoview nml EF 83%  04/28/2006 Transabd and Endovag pelvic U/S   s/p Hyst, R Ovary not visualized h/o L Oophorectomy 05/10/2007 Colonoscopy Ischemic colitis Bx negative for colitis (Dr Jarold Motto) 05/23/2007 Mesenteric CT ANgiogram Mild atheromatous dz, No Stenosis 06/03/2007 HOSP [facility] CP R/O'D BLurry Vision Smoking  5/6-08/04/2009 CT Head w/o Mild age appropr Atrophy  08/02/2009 Carotid U/S Nml 08/02/2009 ETT Myoview Nml 08/03/2009  Family History: Last updated: 08/29/2009 Father dec 78 Lung Ca Mother dec 76 MI Stroke Brother A 51 Brother dec  Lung Ca w mets  COPD Brother A 53 CAD  Brother dec MVA Sister dec Stomach resection- wt loss Sister dec Cirrhosis due Heart dz Sister A 6 Sister A 40 Sister A 84 Sister A 51  Social History: Last updated: 07/19/2006 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 Married Former Smoker Quit 03/2001 50+ pyh Alcohol use-no Drug use-no  Risk Factors: Alcohol Use: 0 (08/29/2009) Caffeine Use: 2 (08/29/2009) Exercise: no (08/29/2009)  Risk Factors: Smoking Status: quit (08/29/2009) Packs/Day: 2003 50+pyh (08/29/2009)  Family History: Father dec 78 Lung Ca Mother dec 76 MI Stroke Brother A 74 Brother dec Lung Ca w mets  COPD Brother A 53 CAD  Brother dec MVA Sister dec Stomach resection- wt loss Sister dec Cirrhosis due Heart dz Sister A 77 Sister A 53 Sister A 7 Sister A 51  Social History: Caffeine use/day:  2 Does Patient Exercise:  no  Review of Systems General:  Complains of weight loss; denies chills, fatigue, fever, sweats, and weakness. Eyes:  Complains of blurring; denies discharge and eye pain; needs eye exam. ENT:  Denies decreased hearing, ear discharge, earache, and ringing in ears. CV:  Complains of shortness of breath with exertion; denies chest pain or discomfort, fainting, fatigue, palpitations, swelling of feet, and swelling of hands; recent hosp for CP, R/o'd. Resp:  Complains of shortness of breath; denies cough and wheezing; rare wheezing. GU:  Denies discharge, dysuria, nocturia, and urinary frequency. MS:  Complains of low back pain and stiffness; denies joint pain, muscle aches, and cramps. Derm:  Denies dryness, itching, and rash. Neuro:  Denies numbness, poor balance, tingling, and tremors.  Physical Exam  General:  Well-developed,well-nourished,in no acute distress; alert,appropriate and cooperative throughout examination, looks comfortable and healthy. Head:  Normocephalic and atraumatic  without obvious abnormalities. No apparent alopecia or balding. Eyes:  Conjunctiva clear bilaterally.  Ears:  External ear exam shows no significant lesions or deformities.  Otoscopic examination reveals clear canals, tympanic membranes are intact bilaterally without bulging, retraction, inflammation or discharge. Hearing is grossly normal bilaterally. Nose:  External nasal examination shows no deformity or inflammation. Nasal mucosa are pink and moist without lesions or exudates. Mouth:  Oral mucosa and oropharynx without lesions or exudates.  Teeth in good repair. Neck:  No deformities, masses, or tenderness noted. Chest Wall:  No deformities, masses, or tenderness noted. Breasts:  No mass, nodules, thickening, tenderness, bulging, retraction, inflamation, nipple discharge or skin changes noted.  Implants in place. Lungs:  Normal respiratory effort, chest expands symmetrically. Lungs are clear to auscultation, no crackles or wheezes. Heart:  Normal rate and regular rhythm. S1 and S2 normal without gallop, murmur, click, rub or other extra sounds. Abdomen:  Bowel sounds positive,abdomen soft and non-tender without masses, organomegaly or hernias noted. No particular pain with direct palpation but she says she is "sore." Rectal:  No external abnormalities noted. Normal sphincter tone. No rectal masses or tenderness. G neg. Genitalia:  Bimanual only done Introitus wnl, Uterus and  Cervix absent, Adnexa nontender w/o mass, ovaries not felt.  Msk:  No deformity or scoliosis noted of thoracic or lumbar spine.  Pain in the left trapezius and rhomboid area to direct palpation, minimal spasm felt. Pulses:  R and L carotid,radial,femoral,dorsalis pedis and posterior tibial pulses are full and equal bilaterally Extremities:  Left lateral foot with 8mm sclerotic lesion on the lateral surface of the foot, irritaed, "target appearance" with some peripheral callousing and deep. Neurologic:  No cranial nerve  deficits noted. Station and gait are normal. Sensory, motor and coordinative functions appear intact. Skin:  Intact without suspicious lesions or rashes Cervical Nodes:  No lymphadenopathy noted Axillary Nodes:  No palpable lymphadenopathy Inguinal Nodes:  No significant adenopathy Psych:  Cognition and judgment appear intact. Alert and cooperative with normal attention span and concentration. No apparent delusions, illusions, hallucinations   Impression & Recommendations:  Problem # 1:  HEALTH MAINTENANCE EXAM (ICD-V70.0) Assessment Comment Only Discussed lifestyle and healthy approaches, diet and exercise.  Problem # 2:  B12 DEFICIENCY (ICD-266.2) Assessment: Unchanged Stable, labs nml.  Problem # 3:  CONSTIPATION (ICD-564.00) Try Citrucel 1 tsp daily in 8 oz water.  Problem # 4:  PLANTAR WART, LEFT FOOT (HKV-425.95) Assessment: Unchanged Discussed slowly paring down daily.  Problem # 5:  HYPERTENSION (ICD-401.9) Assessment: Unchanged Stable. Her updated medication list for this problem includes:    Toprol Xl 25 Mg Tb24 (Metoprolol succinate) .Marland Kitchen... Take 1 tablet by mouth once a day  BP today: 130/88 Prior BP: 122/80 (01/15/2009)  Labs Reviewed: K+: 5.6 (08/21/2009) Creat: : 0.9 (08/21/2009)   Chol: 136 (08/21/2009)   HDL: 41.50 (08/21/2009)   LDL: 67 (08/21/2009)   TG: 139.0 (08/21/2009)  Problem # 6:  GERD (ICD-530.81) Assessment: Unchanged Will try jogging in a jug.  Problem # 7:  CORONARY ARTERY DISEASE (ICD-414.00) Assessment: Unchanged  Recent hosp with recent workup that was benign. Her updated medication list for this problem includes:    Toprol Xl 25 Mg Tb24 (Metoprolol succinate) .Marland Kitchen... Take 1 tablet by mouth once a day    Alber[employer]ons Buffered Aspirin 325 Mg Tabs (Aspirin buffered) .Marland Kitchen... Take 1 tablet by mouth once a day  Labs Reviewed: Chol: 136 (08/21/2009)   HDL: 41.50 (08/21/2009)   LDL: 67 (08/21/2009)   TG: 139.0 (08/21/2009)  Problem # 8:  COPD  (ICD-496) Assessment: Unchanged  Stable, cont curr meds.Inflate lungs as able. Her updated medication list for this problem includes:    Advair Diskus 100-50 Mcg/dose Misc (Fluticasone-salmeterol) .Marland Kitchen... 1 puff two times a day    Spiriva Handihaler 18 Mcg Caps (Tiotropium bromide monohydrate) .Marland Kitchen... Take 1 capsule by mouth once a day    Proair Hfa 108 (90 Base) Mcg/act Aers (Albuterol sulfate) .Marland Kitchen... 2 puff four times a day  as needed  Pulmonary Functions Reviewed: O2 sat: 93 (03/19/2008)     Vaccines Reviewed: Flu Vax: Fluvax 3+ (01/03/2008)  Problem # 9:  FIBROCYSTIC BREAST DISEASE S/P MASTWECTOMIES W/ IMPLANTS (ICD-610.1) Assessment: Unchanged Implants in place, feel nml, breast exam benign.  Complete Medication List: 1)  Toprol Xl 25 Mg Tb24 (Metoprolol succinate) .... Take 1 tablet by mouth once a day 2)  Advair Diskus 100-50 Mcg/dose Misc (Fluticasone-salmeterol) .Marland Kitchen.. 1 puff two times a day 3)  Alprazolam 0.5 Mg Tabs (Alprazolam) .... Take 1 tablet by mouth two times a day 4)  Spiriva Handihaler 18 Mcg Caps (Tiotropium bromide monohydrate) .... Take 1 capsule by mouth once a day 5)  Proair Hfa 108 (90 Base)  Mcg/act Aers (Albuterol sulfate) .... 2 puff four times a day  as needed 6)  Alber[employer]ons Buffered Aspirin 325 Mg Tabs (Aspirin buffered) .... Take 1 tablet by mouth once a day 7)  Zocor 10 Mg Tabs (Simvastatin) .... Take one by mouth at bedtime 8)  Cyanocobalamin 1000 Mcg/ml Inj Soln (Cyanocobalamin) .Marland Kitchen.. 1cc im every month  Patient Instructions: 1)  Try Citrucel for constipation. 2)  Try "Jogging in a jug" daily for musc aches/spasms.  Current Allergies (reviewed today): ZOLOFT (SERTRALINE HCL)

## 2010-05-01 NOTE — Assessment & Plan Note (Signed)
Summary: B-12/JRR   Nurse Visit   Allergies: 1)  Zoloft (Sertraline Hcl)  Medication Administration  Injection # 1:    Medication: Vit B12 1000 mcg    Diagnosis: B12 DEFICIENCY (ICD-266.2)    Route: IM    Site: R deltoid    Exp Date: 01/29/2011    Lot #: [id number]    Mfr: American Regent    Patient tolerated injection without complications    Given by: Linde Gillis CMA (AAMA) (June 14, 2009 8:30 AM)  Orders Added: 1)  Vit B12 1000 mcg [J3420] 2)  Admin of Therapeutic Inj  intramuscular or subcutaneous [[id number]]

## 2010-05-01 NOTE — Progress Notes (Signed)
Summary: chest pain  Phone Note Call from Patient   Caller: Patient Call For: Shaune Leeks MD Summary of Call: Pt called, said she was having chest pain, left arm pain, pain in her back, some shortness of breath and vision problems.  This all started about 30 minutes ago.  Advised pt to go to ER for evaluation.  Her husband is going to take her. Initial call taken by: Lowella Petties CMA,  Aug 02, 2009 9:28 AM  Follow-up for Phone Call        Noted, agree. Follow-up by: Shaune Leeks MD,  Aug 02, 2009 10:21 AM

## 2010-05-01 NOTE — Progress Notes (Signed)
Summary: Rx Alprazolam  Phone Note Refill Request Call back at [phone] Message from:  [facility] on July 24, 2009 10:23 AM  Refills Requested: Medication #1:  ALPRAZOLAM 0.5 MG  TABS Take 1 tablet by mouth two times a day   Last Refilled: 06/25/2009  Method Requested: Telephone to Pharmacy Initial call taken by: Sydell Axon LPN,  July 24, 2009 10:23 AM  Follow-up for Phone Call        Rx called to pharmacy. Left message on machine for patient to call back. Sydell Axon LPN  July 24, 2009 10:55 AM  Patient notified as instructed by telephone. Patient transferred to Lupita Leash) front office to schedule appts. Follow-up by: Sydell Axon LPN,  July 24, 2009 11:28 AM    Prescriptions: ALPRAZOLAM 0.5 MG  TABS (ALPRAZOLAM) Take 1 tablet by mouth two times a day  #60 Each x 5   Entered and Authorized by:   Shaune Leeks MD   Signed by:   Shaune Leeks MD on 07/24/2009   Method used:   Telephoned to ...       [facility] [address] (retail)       [address], [address]       [place], [place]  [address]       Ph: [phone]       Fax: [phone]   RxID:   [id number]  Pt needs to be seen for health maintenance...30 min appt preferably before I leave with labs prior. Shaune Leeks MD  July 24, 2009 10:44 AM

## 2010-05-01 NOTE — Assessment & Plan Note (Signed)
Summary: B-12   Nurse Visit   Allergies: 1)  Zoloft (Sertraline Hcl)  Medication Administration  Injection # 1:    Medication: Vit B12 1000 mcg    Diagnosis: B12 DEFICIENCY (ICD-266.2)    Route: IM    Site: L deltoid    Exp Date: 02/2011    Lot #: [id number]    Mfr: American Regent    Patient tolerated injection without complications    Given by: Lowella Petties CMA (October 17, 2009 8:42 AM)  Orders Added: 1)  Vit B12 1000 mcg [J3420] 2)  Admin of Therapeutic Inj  intramuscular or subcutaneous [[id number]]   Medication Administration  Injection # 1:    Medication: Vit B12 1000 mcg    Diagnosis: B12 DEFICIENCY (ICD-266.2)    Route: IM    Site: L deltoid    Exp Date: 02/2011    Lot #: [id number]    Mfr: American Regent    Patient tolerated injection without complications    Given by: Lowella Petties CMA (October 17, 2009 8:42 AM)  Orders Added: 1)  Vit B12 1000 mcg [J3420] 2)  Admin of Therapeutic Inj  intramuscular or subcutaneous [[id number]]

## 2010-05-01 NOTE — Assessment & Plan Note (Signed)
Summary: SCHALLER B12/RBH   Nurse Visit   Allergies: 1)  Zoloft (Sertraline Hcl)  Medication Administration  Injection # 1:    Medication: Vit B12 1000 mcg    Diagnosis: B12 DEFICIENCY (ICD-266.2)    Route: IM    Site: L deltoid    Exp Date: 11/29/2010    Lot #: [id number]    Mfr: American Regent    Patient tolerated injection without complications    Given by: Mervin Hack CMA Duncan Dull) (May 17, 2009 8:26 AM)  Orders Added: 1)  Vit B12 1000 mcg [J3420] 2)  Admin of Therapeutic Inj  intramuscular or subcutaneous [[id number]]   Medication Administration  Injection # 1:    Medication: Vit B12 1000 mcg    Diagnosis: B12 DEFICIENCY (ICD-266.2)    Route: IM    Site: L deltoid    Exp Date: 11/29/2010    Lot #: [id number]    Mfr: American Regent    Patient tolerated injection without complications    Given by: Mervin Hack CMA (AAMA) (May 17, 2009 8:26 AM)  Orders Added: 1)  Vit B12 1000 mcg [J3420] 2)  Admin of Therapeutic Inj  intramuscular or subcutaneous [[id number]]

## 2010-05-01 NOTE — Assessment & Plan Note (Signed)
Summary: ESTABH FROM [facility] AND LUMP UNDER ARM/DLO   Vital Signs:  Patient profile:   70 year old female Height:      67 inches Weight:      154.25 pounds BMI:     24.25 Temp:     97.9 degrees F oral Pulse rate:   84 / minute Pulse rhythm:   regular BP sitting:   160 / 86  (left arm) Cuff size:   regular  Vitals Entered By: Delilah Shan CMA Duncan Dull) (December 11, 2009 4:04 PM) CC: Establish from [facility].   2.  Lump under arm.  3.  Flu shot   History of Present Illness: Lump under R arm on R side of chest wall.  Occ painful laying on that side.  present about 2 months.  Mild increase in size.  No discharge.   Still smoking.   Allergies: 1)  Zoloft (Sertraline Hcl)  Family History: Reviewed history from 08/29/2009 and no changes required. Father dec 78 Lung Ca Mother dec 76 MI Stroke Brother A 13 Brother dec Lung Ca w mets 2011 COPD Brother A 44 CAD  Brother dec MVA Sister dec Stomach resection- wt loss Sister dec Cirrhosis due Heart dz Sister A 16 Sister A 46 Sister A 54 Sister A 53  Social History: Reviewed history from 07/19/2006 and no changes required. 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  Otherwise negative.    Physical Exam  General:  NAD RRR chest wall with typical appearing lipoma  ~3cm across.  Not irritated.  No erythema.     Impression & Recommendations:  Problem # 1:  LIPOMA (ICD-214.9) Call back if enlarging or more painful.  We can set up referral then.  She agrees.    Complete Medication List: 1)  Toprol Xl 25 Mg Tb24 (Metoprolol succinate) .... Take 1 tablet by mouth once a day 2)  Advair Diskus 100-50 Mcg/dose Misc (Fluticasone-salmeterol) .Marland Kitchen.. 1 puff two times a day 3)  Alprazolam 0.5 Mg Tabs (Alprazolam) ....  Take 1 tablet by mouth two times a day 4)  Spiriva Handihaler 18 Mcg Caps (Tiotropium bromide monohydrate) .... Take 1 capsule by mouth once a day 5)  Proair Hfa 108 (90 Base) Mcg/act Aers (Albuterol sulfate) .... 2 puff four times a day  as needed 6)  Alber[employer]ons Buffered Aspirin 325 Mg Tabs (Aspirin buffered) .... Take 1 tablet by mouth once a day 7)  Zocor 10 Mg Tabs (Simvastatin) .... Take one by mouth at bedtime 8)  Cyanocobalamin 1000 Mcg/ml Inj Soln (Cyanocobalamin) .Marland Kitchen.. 1cc im every month  Other Orders: Flu Vaccine 39yrs + MEDICARE PATIENTS (Z6109) Administration Flu vaccine - MCR (U0454)  Patient Instructions: 1)  Call back if the spot is getting larger or more painful.  We can set you up with the surgery clinic to get it removed.  It feels like a lipoma which is a benign (not cancerous) tumor.   2)  Let me know if I can help you stop smoking.   3)  Take care.  Glad to see you.   Current Allergies (reviewed today): ZOLOFT (SERTRALINE HCL)   Flu Vaccine Consent Questions     Do you have a history of severe allergic reactions to this vaccine? no    Any prior  history of allergic reactions to egg and/or gelatin? no    Do you have a sensitivity to the preservative Thimersol? no    Do you have a past history of Guillan-Barre Syndrome? no    Do you currently have an acute febrile illness? no    Have you ever had a severe reaction to latex? no    Vaccine information given and explained to patient? yes    Are you currently pregnant? no    Lot Number:[id number]   Exp Date:09/27/2010   Site Given  Left Deltoid IMdflu Lugene Fuquay CMA (AAMA)  December 11, 2009 4:15 PM

## 2010-05-01 NOTE — Assessment & Plan Note (Signed)
Summary: B-12/JRR   Nurse Visit   Allergies: 1)  Zoloft (Sertraline Hcl)  Medication Administration  Injection # 1:    Medication: Vit B12 1000 mcg    Diagnosis: B12 DEFICIENCY (ICD-266.2)    Route: IM    Site: L deltoid    Exp Date: 01/28/2011    Lot #: [id number]    Mfr: American Regent    Patient tolerated injection without complications    Given by: Delilah Shan CMA (AAMA) (July 16, 2009 8:40 AM)  Orders Added: 1)  Admin of Therapeutic Inj  intramuscular or subcutaneous [[id number]] 2)  Vit B12 1000 mcg [J3420]   Medication Administration  Injection # 1:    Medication: Vit B12 1000 mcg    Diagnosis: B12 DEFICIENCY (ICD-266.2)    Route: IM    Site: L deltoid    Exp Date: 01/28/2011    Lot #: [id number]    Mfr: American Regent    Patient tolerated injection without complications    Given by: Delilah Shan CMA (AAMA) (July 16, 2009 8:40 AM)  Orders Added: 1)  Admin of Therapeutic Inj  intramuscular or subcutaneous [[id number]] 2)  Vit B12 1000 mcg [J3420]

## 2010-05-01 NOTE — Assessment & Plan Note (Signed)
Summary: B-12   Nurse Visit   Allergies: 1)  Zoloft (Sertraline Hcl)  Medication Administration  Injection # 1:    Medication: Vit B12 1000 mcg    Diagnosis: B12 DEFICIENCY (ICD-266.2)    Route: IM    Site: R deltoid    Exp Date: 02/28/2011    Lot #: [id number]    Mfr: American Regent    Given by: Benny Lennert CMA (AAMA) (November 19, 2009 9:01 AM)  Orders Added: 1)  Vit B12 1000 mcg [J3420] 2)  Admin of Therapeutic Inj  intramuscular or subcutaneous [[id number]]

## 2010-05-06 ENCOUNTER — Encounter: Payer: Self-pay | Admitting: Family Medicine

## 2010-05-06 ENCOUNTER — Ambulatory Visit (INDEPENDENT_AMBULATORY_CARE_PROVIDER_SITE_OTHER): Payer: MEDICARE

## 2010-05-06 DIAGNOSIS — E538 Deficiency of other specified B group vitamins: Secondary | ICD-10-CM

## 2010-05-07 ENCOUNTER — Ambulatory Visit: Payer: MEDICARE

## 2010-05-15 NOTE — Assessment & Plan Note (Signed)
Summary: b12 duncan/jrr   Nurse Visit   Allergies: 1)  Zoloft (Sertraline Hcl)  Medication Administration  Injection # 1:    Medication: Vit B12 1000 mcg    Diagnosis: B12 DEFICIENCY (ICD-266.2)    Route: IM    Site: R deltoid    Exp Date: 12/29/2011    Lot #: [id number]    Mfr: American Regent    Patient tolerated injection without complications    Given by: Sydell Axon LPN (May 06, 2010 9:27 AM)  Orders Added: 1)  Vit B12 1000 mcg [J3420] 2)  Admin of Therapeutic Inj  intramuscular or subcutaneous [[id number]]   Medication Administration  Injection # 1:    Medication: Vit B12 1000 mcg    Diagnosis: B12 DEFICIENCY (ICD-266.2)    Route: IM    Site: R deltoid    Exp Date: 12/29/2011    Lot #: [id number]    Mfr: American Regent    Patient tolerated injection without complications    Given by: Sydell Axon LPN (May 06, 2010 9:27 AM)  Orders Added: 1)  Vit B12 1000 mcg [J3420] 2)  Admin of Therapeutic Inj  intramuscular or subcutaneous [[id number]]

## 2010-06-02 ENCOUNTER — Telehealth: Payer: Self-pay | Admitting: Family Medicine

## 2010-06-05 ENCOUNTER — Ambulatory Visit (INDEPENDENT_AMBULATORY_CARE_PROVIDER_SITE_OTHER): Payer: MEDICARE

## 2010-06-05 ENCOUNTER — Telehealth: Payer: Self-pay | Admitting: Family Medicine

## 2010-06-05 ENCOUNTER — Encounter: Payer: Self-pay | Admitting: Family Medicine

## 2010-06-05 DIAGNOSIS — E538 Deficiency of other specified B group vitamins: Secondary | ICD-10-CM

## 2010-06-06 ENCOUNTER — Ambulatory Visit (INDEPENDENT_AMBULATORY_CARE_PROVIDER_SITE_OTHER): Payer: MEDICARE | Admitting: Family Medicine

## 2010-06-06 ENCOUNTER — Encounter: Payer: Self-pay | Admitting: Family Medicine

## 2010-06-06 DIAGNOSIS — J029 Acute pharyngitis, unspecified: Secondary | ICD-10-CM

## 2010-06-10 NOTE — Assessment & Plan Note (Signed)
Summary: b-12 jrt   Nurse Visit   Allergies: 1)  Zoloft (Sertraline Hcl)  Medication Administration  Injection # 1:    Medication: Vit B12 1000 mcg    Diagnosis: B12 DEFICIENCY (ICD-266.2)    Route: IM    Site: L deltoid    Exp Date: 12/29/2011    Lot #: [id number]    Mfr: American Regent    Patient tolerated injection without complications    Given by: Lewanda Rife LPN (June 04, 452 9:12 AM)  Orders Added: 1)  Vit B12 1000 mcg [J3420] 2)  Admin of Therapeutic Inj  intramuscular or subcutaneous [[id number]]

## 2010-06-10 NOTE — Assessment & Plan Note (Signed)
Summary: ST/CLE  MEDICARE AARP   Vital Signs:  Patient profile:   70 year old female Weight:      148.50 pounds Temp:     97.7 degrees F oral Pulse rate:   88 / minute Pulse rhythm:   regular BP sitting:   130 / 76  (left arm) Cuff size:   regular  Vitals Entered By: Selena Batten Dance CMA Duncan Dull) (June 06, 2010 11:43 AM) CC: Sore throat x4 days   History of Present Illness: CC: ST  4d h/o ST.  Mainly on R side.  also red throat.  Feels swollen in am, trouble swallowing sold foods.  + nausea with ST.  Has been losing weight, about 30 lbs in last 2 years, hasn't been trying.  So far has tried listerine gargles.  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  No sick contacts at home.  Husband smokes inside and outside.  Pt smoking 6 cig/day.  Has smoked for 40 years, has recently been trying to cut back.  No h/o asthma.  Coughing from COPD at baseline.  mammo - last 3-4 years ago, hasn't noticed any new masses colonoscopy - 2009 ok had CT scan 4 mo ago - to follow COPD and "spot on lung", told OK, no change.  followed by [facility].  Allergies: 1)  Zoloft (Sertraline Hcl)  Past History:  Past Medical History: Last updated: 07/19/2006 COPD, Severe  (07/29/1998) Coronary artery disease, Non Q-wave MI, Stent RCA (01/22/1999) GERD, Severe  (08/29/1998) Hypertension (03/30/2000)  Past Surgical History: Last updated: 08/10/2009 Laminectomy/Disc Removal x 2 Lumbar 1976 Bilat Mastectomy due FCBD w/  implants 03/1976 [facility]) Hysterectomy w/ LSO Endometriosis (Dr Joanne Gavel) 1979 EGD Esoph Dilation 08/1998 CATH w/ Stent 90% RCA Lesion 01/22/1999 ETT nm l12/2000 ETT Cardiolite pos ETT neg Cardiolite 05/1999 HIDA abnml (Siegal) 07/1999 Abd U/S Anechoic region L Liver Lobe 07/1999 CATH 60% RCA o/w 20-30% Lesions 08/29/2001 ETT Cardiolite nml6/08/2001 Holter 24hr freq PVCs 05/2004 ETT Myoview nml 01/12/2005 ETT Myoview nml EF 83% 04/28/2006 Transabd and  Endovag pelvic U/S   s/p Hyst, R Ovary not visualized h/o L Oophorectomy 05/10/2007 Colonoscopy Ischemic colitis Bx negative for colitis (Dr Jarold Motto) 05/23/2007 Mesenteric CT ANgiogram Mild atheromatous dz, No Stenosis 06/03/2007 HOSP [facility] CP R/O'D BLurry Vision Smoking  5/6-08/04/2009 CT Head w/o Mild age appropr Atrophy  08/02/2009 Carotid U/S Nml 08/02/2009 ETT Myoview Nml 08/03/2009  Family History: Last updated: 12/11/2009 Father dec 78 Lung Ca Mother dec 76 MI Stroke Brother A 55 Brother dec Lung Ca w mets 2011 COPD Brother A 53 CAD  Brother dec MVA Sister dec Stomach resection- wt loss Sister dec Cirrhosis due Heart dz Sister A 26 Sister A 48 Sister A 81 Sister A 51  Social History: Last updated: 12/11/2009 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 No apparent alopecia or balding. Eyes:  Conjunctiva clear bilaterally.  Ears:  cerumen impactions bilaterally Nose:  nares clear Mouth:  MMM, pharyngeal erythema, no exudates Neck:  no carotid bruits.  no TM, no LAD.  no masses appreciated throughout.  nontender. Lungs:  Normal respiratory effort, chest expands symmetrically.  Lungs are clear to auscultation, no crackles or wheezes. Heart:  Normal rate and regular rhythm. S1 and S2 normal without gallop, murmur, click, rub or other extra sounds. Pulses:  2+ rad pulses bilaterally   Impression & Recommendations:  Problem # 1:  PHARYNGITIS, ACUTE (ICD-462) likely viral given short duration, supportive care as per instructions.  however does have risk factors  (smoking).  advised improtance of returning if not resolved after 10 days for further eval.   UTD CT scan lungs.   declines mammo, UTD colon screening.  Her updated medication list for this problem includes:    Albertsons Buffered Aspirin 325 Mg Tabs (Aspirin buffered) .Marland Kitchen... Take 1 tablet by mouth once a day  Orders: Rapid Strep (62130)  Complete Medication List: 1)  Toprol Xl 25 Mg Tb24 (Metoprolol succinate) .... Take 1 tablet by mouth once a day 2)  Advair Diskus 100-50 Mcg/dose Misc (Fluticasone-salmeterol) .Marland Kitchen.. 1 puff two times a day 3)  Alprazolam 0.5 Mg Tabs (Alprazolam) .... Take 1 tablet by mouth two times a day 4)  Spiriva Handihaler 18 Mcg Caps (Tiotropium bromide monohydrate) .... Take 1 capsule by mouth once a day 5)  Proair Hfa 108 (90 Base) Mcg/act Aers (Albuterol sulfate) .... 2 puff four times a day  as needed 6)  Albertsons Buffered Aspirin 325 Mg Tabs (Aspirin buffered) .... Take 1 tablet by mouth once a day 7)  Zocor 10 Mg Tabs (Simvastatin) .... Take one by mouth at bedtime 8)  Cyanocobalamin 1000 Mcg/ml Inj Soln (Cyanocobalamin) .Marland Kitchen.. 1cc im every month  Patient Instructions: 1)  I think this could be viral pharyngitis or sore throat. 2)  Symptoms usually last 7-10 days.   3)  If going on past 1-2 weeks, please return for further evaluation. 4)  Take tylenol for throat inflammation. 5)  Suck on cold things like popsicles or warm things like herbal teas (whichever soothes your throat better). 6)  Salt water gargles, consider throat lozenges/chloraseptic. 7)  return if you are not improving as expected, or if you have high fevers (>101.5) or difficulty swallowing. 8)  Call clinic with questions.  Pleasure to see you today.  9)  best thing to do for your health is quitting smoking!   Orders Added: 1)  Rapid Strep [86578] 2)  Est. Patient Level III [46962]    Current Allergies (reviewed today): ZOLOFT (SERTRALINE HCL)  Laboratory Results  Date/Time Received:  June 06, 2010 11:44 AM  Date/Time Reported: June 06, 2010 11:46 AM   Other Tests  Rapid Strep: negative

## 2010-06-10 NOTE — Progress Notes (Signed)
Summary: ? last tetanus injection  Phone Note Other Incoming   Summary of Call: Pt came in for Vit B12 injection and pt said she stuck her rt middle finger next to the nail with a rusty sewing needle on 06/04/10. there is no redness, swelling or drainage of rt finger noted. Pt said is a little sore this AM. Pt had last DT 04/30/2005. Dr Hetty Ely said that was OK. Pt will call back if any change or signs of infection, redness, pain, swelling or drainage.Lewanda Rife LPN  June 05, 8467 9:10 AM

## 2010-06-10 NOTE — Progress Notes (Signed)
Summary: asking for referral   Phone Note Call from Patient Call back at Home Phone [phone]   Caller: Patient Call For: Dr. Para March Summary of Call: Patient is asing for referral for the knot under her right arm, it has been causing her more pain recently and she can't lay on her side.  Patient prefers going to [facility].  Initial call taken by: Melody Comas,  June 02, 2010 10:13 AM  Follow-up for Phone Call        referral sent to [facility].   Follow-up by: Crawford Givens MD,  June 02, 2010 1:47 PM

## 2010-06-12 ENCOUNTER — Encounter: Payer: Self-pay | Admitting: Family Medicine

## 2010-06-17 ENCOUNTER — Emergency Department: Payer: Self-pay | Admitting: Unknown Physician Specialty

## 2010-07-01 ENCOUNTER — Other Ambulatory Visit: Payer: Self-pay | Admitting: *Deleted

## 2010-07-01 MED ORDER — ALPRAZOLAM 0.5 MG PO TABS: 0.5000 mg | ORAL_TABLET | Freq: Two times a day (BID) | ORAL | Status: DC

## 2010-07-01 NOTE — Telephone Encounter (Signed)
Please call this in  

## 2010-07-01 NOTE — Telephone Encounter (Signed)
Medication phoned to pharmacy.  

## 2010-07-03 ENCOUNTER — Encounter: Payer: Self-pay | Admitting: Family Medicine

## 2010-07-08 ENCOUNTER — Ambulatory Visit: Payer: MEDICARE

## 2010-07-10 ENCOUNTER — Ambulatory Visit (INDEPENDENT_AMBULATORY_CARE_PROVIDER_SITE_OTHER): Payer: MEDICARE | Admitting: Family Medicine

## 2010-07-10 DIAGNOSIS — E538 Deficiency of other specified B group vitamins: Secondary | ICD-10-CM

## 2010-07-10 MED ORDER — CYANOCOBALAMIN 1000 MCG/ML IJ SOLN
1000.0000 ug | Freq: Once | INTRAMUSCULAR | Status: AC
  Administered 2010-07-10: 1000 ug via INTRAMUSCULAR

## 2010-07-11 NOTE — Progress Notes (Signed)
Nurse visit

## 2010-08-08 ENCOUNTER — Ambulatory Visit (INDEPENDENT_AMBULATORY_CARE_PROVIDER_SITE_OTHER): Payer: MEDICARE | Admitting: Family Medicine

## 2010-08-08 DIAGNOSIS — E538 Deficiency of other specified B group vitamins: Secondary | ICD-10-CM

## 2010-08-08 MED ORDER — CYANOCOBALAMIN 1000 MCG/ML IJ SOLN
1000.0000 ug | Freq: Once | INTRAMUSCULAR | Status: AC
  Administered 2010-08-08: 1000 ug via INTRAMUSCULAR

## 2010-08-08 NOTE — Progress Notes (Signed)
  Subjective:    Patient ID: Mckenzie Mclaughlin, female    DOB: 1941/03/19, 70 y.o.   MRN: [id number]  HPI    Review of Systems     Objective:   Physical Exam        Assessment & Plan:  rn visit.

## 2010-08-12 NOTE — Assessment & Plan Note (Signed)
[facility]                         GASTROENTEROLOGY OFFICE NOTE   NAME:Mckenzie Mclaughlin, Mckenzie Mclaughlin                      MRN:          [id number]  DATE:05/19/2007                            DOB:          09-17-1940    Mckenzie Mclaughlin is a 70 year old white female who has rather severe COPD.  She  is referred through the courtesy of Dr. Hetty Ely because of recurrent  crampy abdominal pain and rectal bleeding.   Mckenzie Mclaughlin had an episode of rather severe crampy lower abdominal pain  followed by rectal bleeding a year ago, and this resolved in 48 hours.  She has done well since that time and has been having regular bowel  movements, and no rectal bleeding until 2 weeks ago when she again  developed lower abdominal discomfort and some bloody mucus discharge  from her rectum.  She had pelvic ultrasound exam that was unremarkable,  as was general physical exam by Dr. Hetty Ely on May 10, 2007.  Laboratory data from that time showed a normal CBC, except for  hemoglobin of 15.3.   The patient currently is asymptomatic and is having regular bowel  movements, and her abdominal pain has pretty much resolved.  She has had  no fevers or chills, upper GI, or hepatobiliary complaints.  She denies  abuse of NSAID, alcohol, but does continue to smoke 1/2 pack of  cigarettes a day.  She denies the use of over-the-counter decongestants,  or any other new medications.  She gives no symptoms of peripheral  vascular disease, but does have coronary artery disease.  She has had  previous coronary artery stenting in 2000.  She has never had previous  GI colonoscopy, but had an endoscopy in 2001 because of atypical chest  pain.  At that time, she was found to have a small hiatal hernia and was  placed on AcipHex 20 mg a day.  Helicobacter pylori was negative at that  time.  Gallbladder workup at that time was abnormal and she underwent  evaluation by Dr. Daphine Deutscher, but I cannot see whether  she actually had  cholecystectomy.   Past medical history revolves around rather severe chronic obstructive  lung disease, although she is not oxygen dependent.  She has also  suffered from fibrocystic disease of her breast, degenerative arthritis  of her lumbosacral spine.  She has had previous hysterectomy.  She is  followed from a cardiac standpoint at the [facility] in [place],  and sees Dr. Marcina Millard.   MEDICATIONS:  1. Spiriva daily.  2. Advair 100/50 one puff a day.  3. ProAir 2 puffs every 4 to 6 hours.  4. Zocor 10 mg a day.  5. Metoprolol 25 mg a day.  6. Xanax 0.5 mg twice a day.  7. Aspirin 325 mg a day.  8. P.r.n. nitroglycerin 0.4 mg.   She denies drug allergies.   FAMILY HISTORY:  Remarkable for atherosclerotic cardiovascular disease,  but no known gastrointestinal problems.   SOCIAL HISTORY:  She is married and lives with her husband.  Works in  the [employer].  She has a 10th grade  education.  She does not  abuse ethanol or illicit drugs, but does continue to smoke 1/2 pack of  cigarettes a day.   REVIEW OF SYSTEMS:  Negative, except for rather marked shortness of  breath with exertion.  She has no resting dyspnea or chest pain.  She  does have angina symptoms, for which she uses p.r.n. nitroglycerin.  She  denies history of cardiac palpitations, previous MI, or know CVA.  Review of systems otherwise noncontributory.   Her appetite is good and her weight is stable, and she denies specific  food intolerances.  She denies problems with her acid reflux at this  time.  Is having specifically no dysphagia, anorexia, or weight loss.   She is a thin but healthy-appearing white female in no acute distress.  She appears her stated age.  She is 5 feet 8 inches and weighs 164 pounds.  Blood pressure 140/72 and  pulse was 80 and regular.  I could not appreciate stigmata of chronic liver disease.  There was no  thyromegaly or lymphadenopathy  noted.  Her chest was surprisingly clear without wheezing or rhonchi.  She appeared to be in a regular rhythm without murmurs, gallops, or  rubs.  There was no hepatosplenomegaly, abdominal masses, or tenderness.  Bowel  sounds were normal.  Peripheral extremities were unremarkable.  Inspection of the rectum was unremarkable, as was rectal exam.  There  was soft, normal-colored stool that was guaiac negative.   ASSESSMENT:  1. Probable resolved ischemic colitis associated with atherogenesis of      her abdominal mesenteric vessels.  Her clinical course does not      seem consistent with inflammatory bowel disease, diverticulosis, et      Karie Soda.  In any case, she seems to be resolving her issues at this      time, and will need colonoscopy exam.  2. Previous history of acid reflux, which does not seem to bother the      patient at this time.  3. History of chronic biliary gallbladder dyskinesia, which again has      not required surgery.  4. Chronic obstructive lung disease with marked exercise limitation.  5. History of coronary artery disease with coronary artery stenting.  6. History of well-controlled hypertension.  7. History of degenerative back disease with previous laminectomies.  8. Continued cigarette abuse.   RECOMMENDATIONS:  1. Outpatient colonoscopy at the patient's convenience.  2. High fiber diet with daily Benefiber and liberal p.o. fluids.  3. P.r.n. sublingual Levsin 0.125 mg every 6 to 8 hours.  4. Repeat laboratory screening parameters.  5. Continue other medications per Dr. Hetty Ely.   ADDENDUM:  The patient did have pelvic ultrasound performed on May 10, 2007 at [facility], and this was normal, except for  previous hysterectomy and previous left oophorectomy.     Vania Rea. Jarold Motto, MD, Caleen Essex, FAGA  Electronically Signed    DRP/MedQ  DD: 05/19/2007  DT: 05/19/2007  Job #: [id number]   cc:   Arta Silence, MD  Dr.  Marcina Millard

## 2010-08-12 NOTE — Assessment & Plan Note (Signed)
[facility]                         GASTROENTEROLOGY OFFICE NOTE   NAME:Mckenzie Mclaughlin, Mckenzie Mclaughlin                      MRN:          [id number]  DATE:06/23/2007                            DOB:          1940/09/24    Mckenzie Mclaughlin returns after her clinic visit of June 03, 2007.  We obtained  mesenteric CT angiogram which showed no mesenteric artery stenosis and  mild aortic iliac atheromatous disease.  Workup otherwise has been  unremarkable.   She is seen today, has constipation, predominate irritable bowel  syndrome, and since she has not responded to Citrucel and high fiber  diet, I will let her try Amitiza 8 mcg twice a day with meals.  She also  is to use MiraLax on a p.r.n. basis as needed.  We will see her back in  1 month's time or p.r.n. basis as needed.     Vania Rea. Jarold Motto, MD, Caleen Essex, FAGA  Electronically Signed    DRP/MedQ  DD: 06/23/2007  DT: 06/23/2007  Job #: [id number]   cc:   Arta Silence, MD

## 2010-08-12 NOTE — Assessment & Plan Note (Signed)
[facility]                         GASTROENTEROLOGY OFFICE NOTE   NAME:Mclaughlin, Mckenzie Mclaughlin                      MRN:          [id number]  DATE:06/03/2007                            DOB:          Aug 28, 1940    Mckenzie Mclaughlin continues with crampy lower abdominal pain, lasting one to two  hours in duration, several times a day, without real precipitating or  alleviating elements.  She has really had no abnormal stool pattern or  rectal bleeding, nausea or vomiting, upper GI, hepatobiliary or systemic  complaints.  I did her colonoscopy on May 23, 2007, and this was  really unremarkable, including biopsies of the splenic flexure area.  Lab data was all normal, including sed rate.   As per my previous notes, Mckenzie Mclaughlin does have atherogenesis and has had  a history of coronary artery stenting in 2000.  She really gives no  history of known peripheral vascular disease or any history of  claudications.  She denies abuse of NSAIDs, alcohol or cigarettes.  She  has had previous hysterectomy.   EXAM:  She is awake and alert, in no acute distress, appearing her  stated age.  She weighs 164 pounds and blood pressure is 140/82 and pulse was 72 and  regular.  She was a healthy, non-toxic-appearing white female.  Her chest was clear and she appeared to be in a regular rhythm without  significant murmurs, gallops or rubs.  Her abdomen showed no distention, organomegaly, masses or tenderness.  Bowel sounds were normal and I could not appreciate a bruit.  Peripheral pulses seemed intact.  Inspection of the rectum was unremarkable and there was formed stool in  the rectal vault that was guaiac-negative.   ASSESSMENT:  I remain concerned that Mckenzie Mclaughlin may have a low-grade  chronic ischemic colitis.  She certainly does not have typical  intestinal angina, but she does have continued symptoms with a negative  colonoscopy.  She has a history of atherosclerosis.   RECOMMENDATIONS:  1. Mesenteric angiogram per CT scan.  2. Trial of Pamine Forte 5 mg every 12 hours as tolerated.  3. Low-fiber diet.  4. Continue other medications as listed above and reviewed in her      chart.  She is on aspirin 325 mg a day.     Vania Rea. Jarold Motto, MD, Caleen Essex, FAGA  Electronically Signed   DRP/MedQ  DD: 06/03/2007  DT: 06/03/2007  Job #: [id number]   cc:   Arta Silence, MD

## 2010-08-29 ENCOUNTER — Other Ambulatory Visit: Payer: Self-pay | Admitting: Family Medicine

## 2010-09-11 ENCOUNTER — Ambulatory Visit (INDEPENDENT_AMBULATORY_CARE_PROVIDER_SITE_OTHER): Payer: Medicare Other | Admitting: Family Medicine

## 2010-09-11 DIAGNOSIS — E538 Deficiency of other specified B group vitamins: Secondary | ICD-10-CM

## 2010-09-11 MED ORDER — CYANOCOBALAMIN 1000 MCG/ML IJ SOLN
1000.0000 ug | Freq: Once | INTRAMUSCULAR | Status: AC
  Administered 2010-09-11: 1000 ug via INTRAMUSCULAR

## 2010-09-11 NOTE — Progress Notes (Signed)
  Subjective:    Patient ID: Mckenzie Mclaughlin, female    DOB: 07/05/1940, 69 y.o.   MRN: [id number]  HPI    Review of Systems     Objective:   Physical Exam        Assessment & Plan:  rn visit.  

## 2010-09-17 IMAGING — CT CT CHEST W/ CM
1 series · 15 of 32 positions shown, 19 images · non-contrast
Comparison: none

REASON FOR EXAM: pulmonary nodule
COMMENTS:

[Series 2: soft tissue · axial · 0.71mm/px · z∈[-762,-447]mm · 15 of 71 slices shown, 19 images]
[im 5/71  soft-tissue]
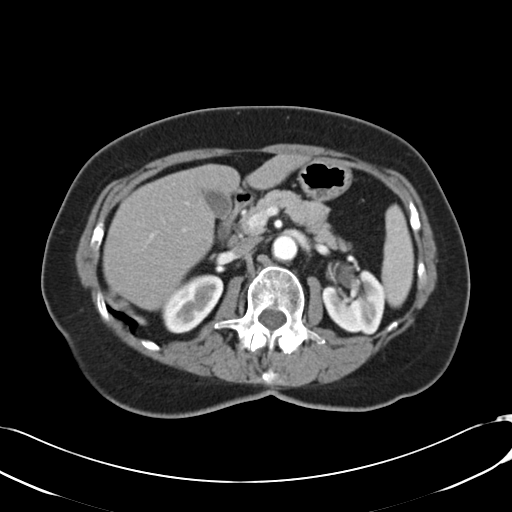
[im 5/71  bone]
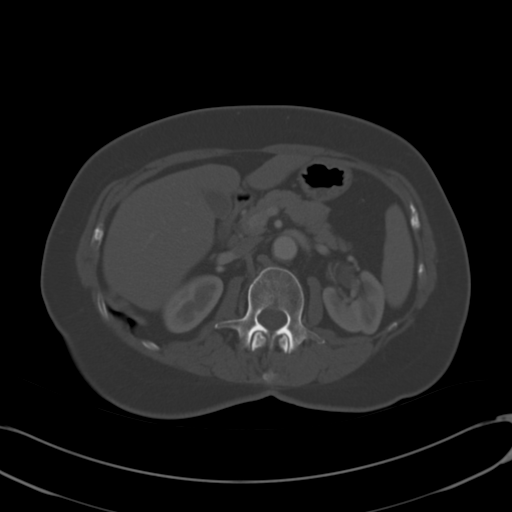
[im 10/71  soft-tissue]
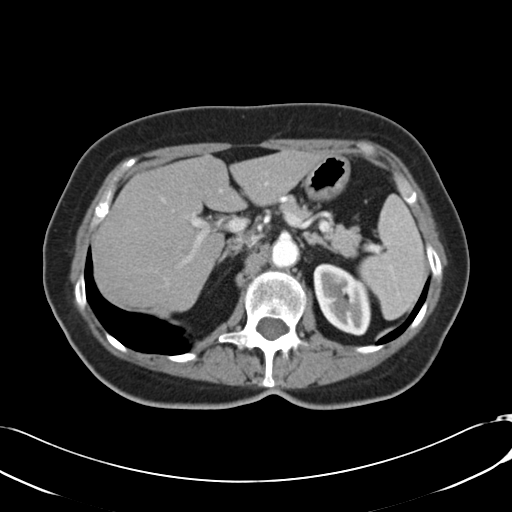
[im 14/71  soft-tissue]
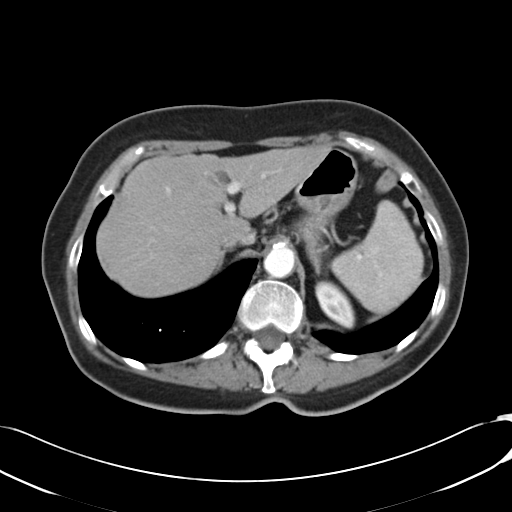
[im 21/71  soft-tissue]
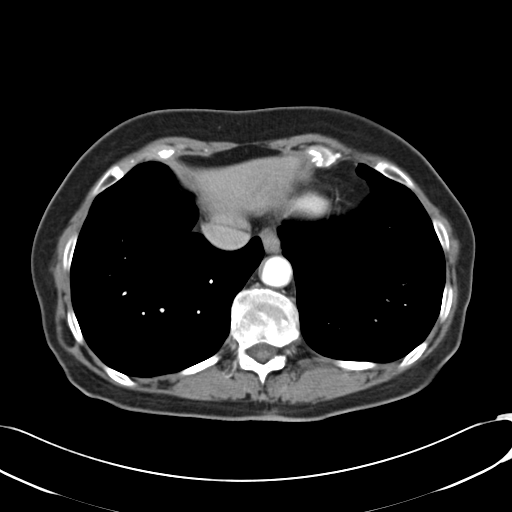
[im 25/71  soft-tissue]
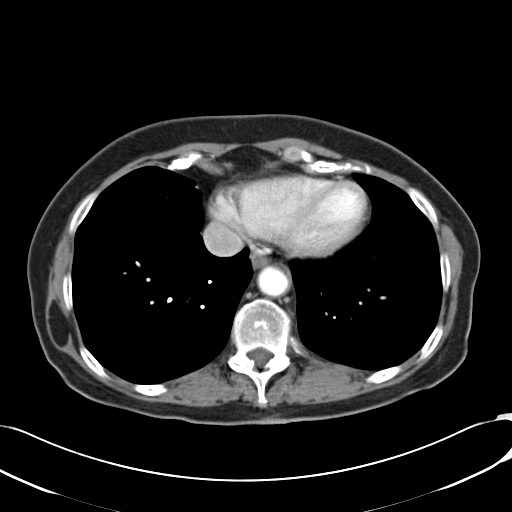
[im 30/71  soft-tissue]
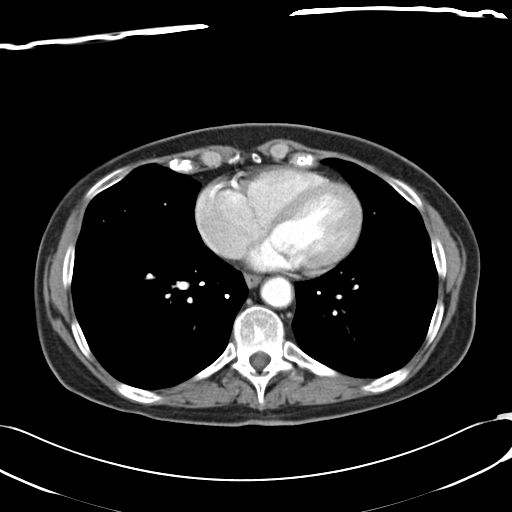
[im 37/71  soft-tissue]
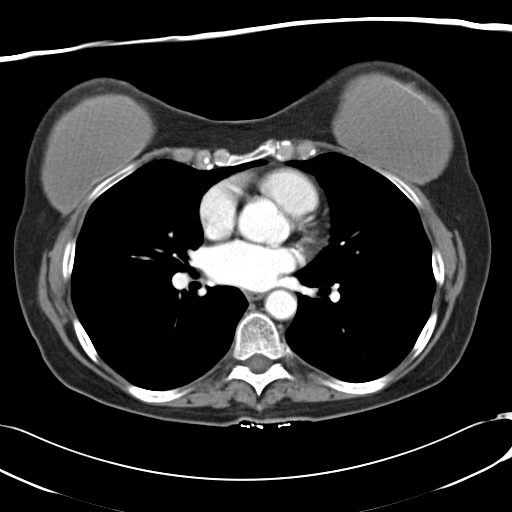
[im 41/71  soft-tissue]
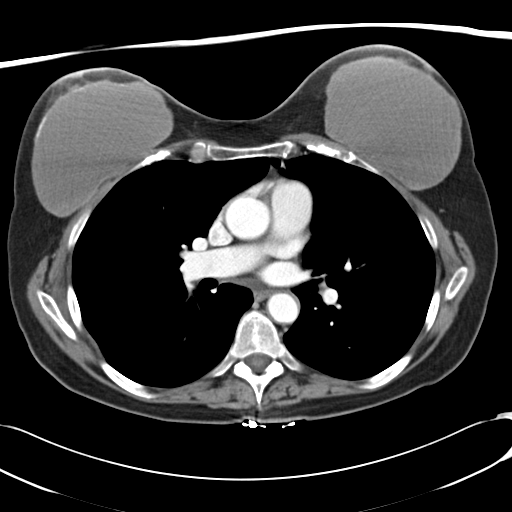
[im 46/71  soft-tissue]
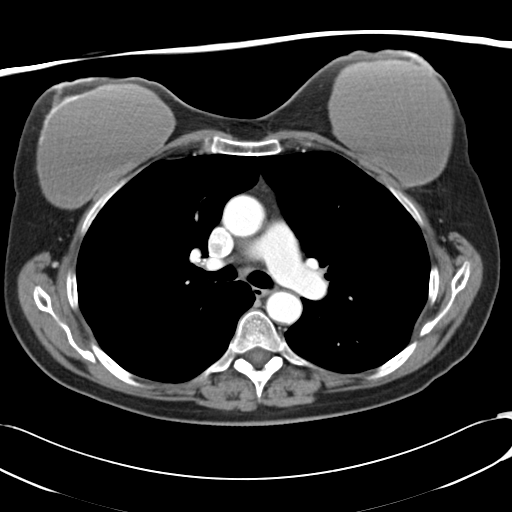
[im 46/71  bone]
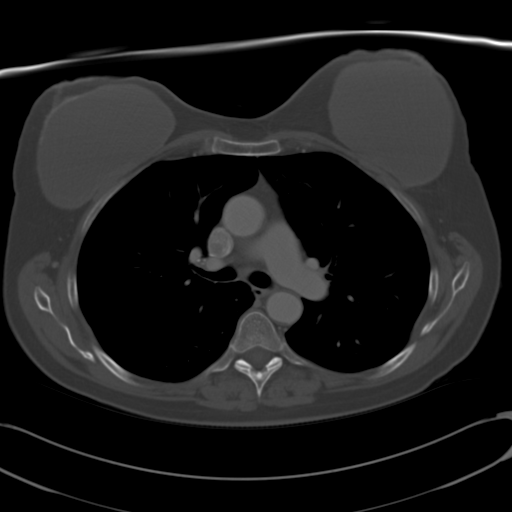
[im 50/71  soft-tissue]
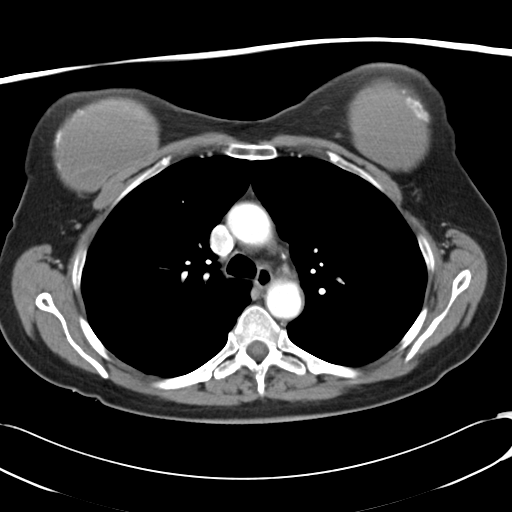
[im 57/71  soft-tissue]
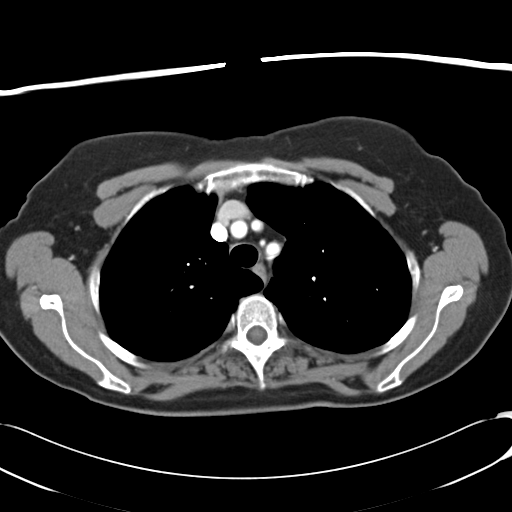
[im 61/71  soft-tissue]
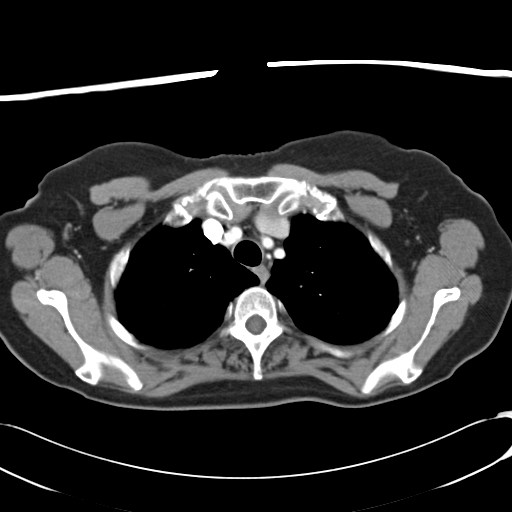
[im 61/71  lung]
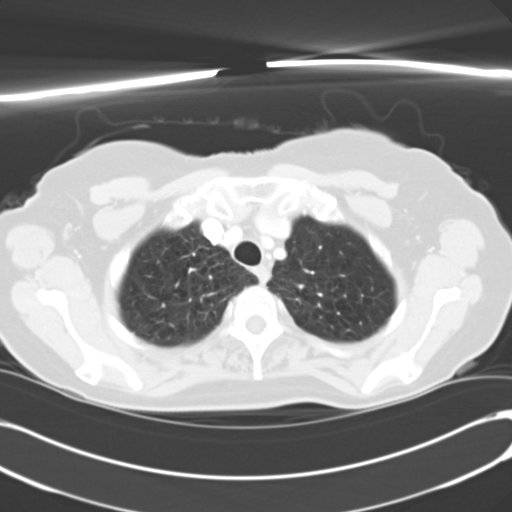
[im 64/71  lung]
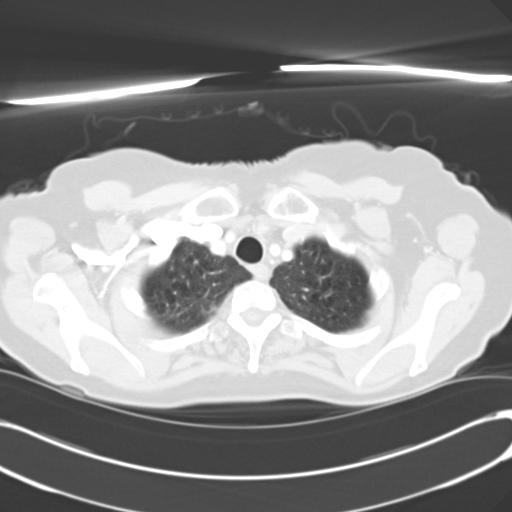
[im 66/71  soft-tissue]
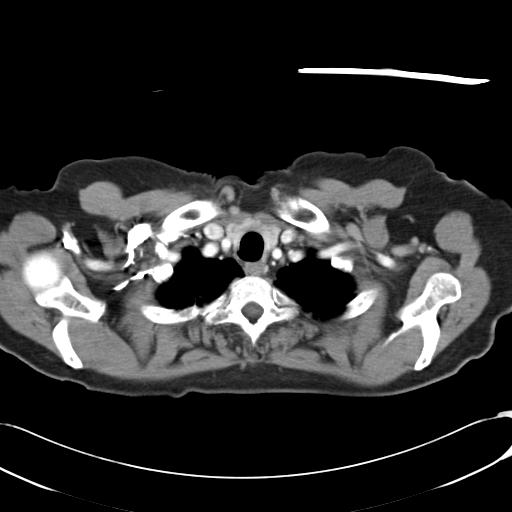
[im 66/71  lung]
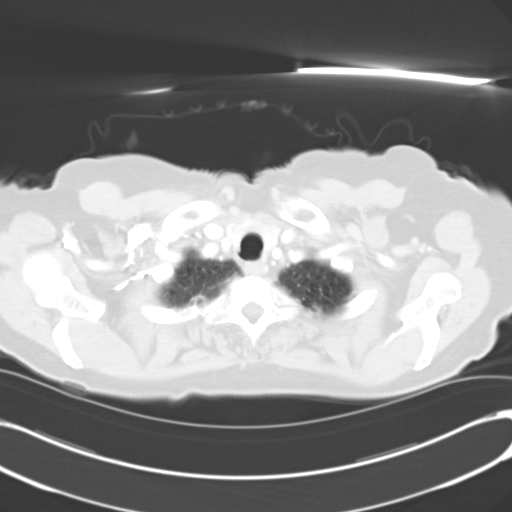
[im 68/71  lung]
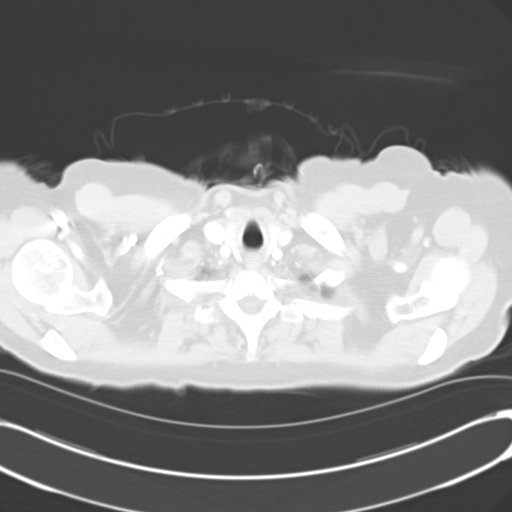

[15 of 32 positions shown; findings below may reference images not displayed]

PROCEDURE:     CT  - CT CHEST WITH CONTRAST  - June 06, 2009 [DATE]

RESULT:     CT of the chest is performed utilizing 70 ml of Wsovue-SZO
iodinated intravenous contrast. Images are reconstructed in the axial plane
at 5 mm slice thickness. Comparison is made to the previous exam of 11/01/2008
and to a study of 05/08/2008.

There are again small, smoothly marginated calcified subpleural lesions in
the right upper lobe and right lower lobe with a similar small, 3 mm
calcified nodular density in the right middle lobe which appears to be
unchanged. No new nodule or mass is evident. Emphysematous lung disease is
present. No bronchiectasis or infiltrate is seen. There is no effusion. No
mediastinal or hilar mass or adenopathy is seen. No axillary or
supraclavicular mass is demonstrated. The thyroid lobes enhance
homogeneously. The aorta is normal in caliber. Again, there is no pleural or
pericardial effusion. There is a cyst adjacent to the falciform ligament in
the liver measuring up to 1.18 cm x 1.75 cm on image #57. This is unchanged.
The upper abdominal structures included on the exam otherwise appear
unremarkable.
IMPRESSION: Stable chest with emphysematous lung disease and stable
pulmonary nodules consistent with previous granulomatous disease and
calcification. Note is made of bilateral breast implants with some
calcification of the margins.

## 2010-10-16 ENCOUNTER — Ambulatory Visit (INDEPENDENT_AMBULATORY_CARE_PROVIDER_SITE_OTHER): Payer: Medicare Other | Admitting: Family Medicine

## 2010-10-16 DIAGNOSIS — E538 Deficiency of other specified B group vitamins: Secondary | ICD-10-CM

## 2010-10-16 MED ORDER — CYANOCOBALAMIN 1000 MCG/ML IJ SOLN
1000.0000 ug | Freq: Once | INTRAMUSCULAR | Status: AC
  Administered 2010-10-16: 1000 ug via INTRAMUSCULAR

## 2010-10-16 NOTE — Progress Notes (Signed)
  Subjective:    Patient ID: Mckenzie Mclaughlin, female    DOB: 02-Sep-1940, 70 y.o.   MRN: [id number]  HPI    Review of Systems     Objective:   Physical Exam        Assessment & Plan:  rn visit.

## 2010-11-13 IMAGING — US US CAROTID DUPLEX BILAT
1 series · 17 of 24 positions shown · non-contrast
Comparison: none

REASON FOR EXAM: blurry vision
COMMENTS:

[Series 1: us carotid duplex bilat · 17 of 62 slices shown]
[im 1/62]
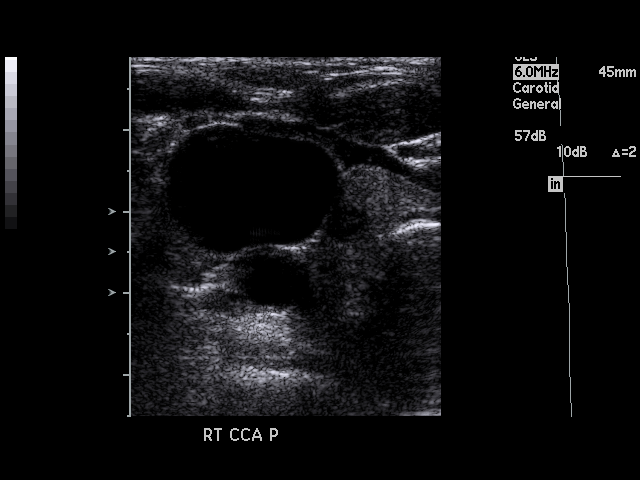
[im 6/62]
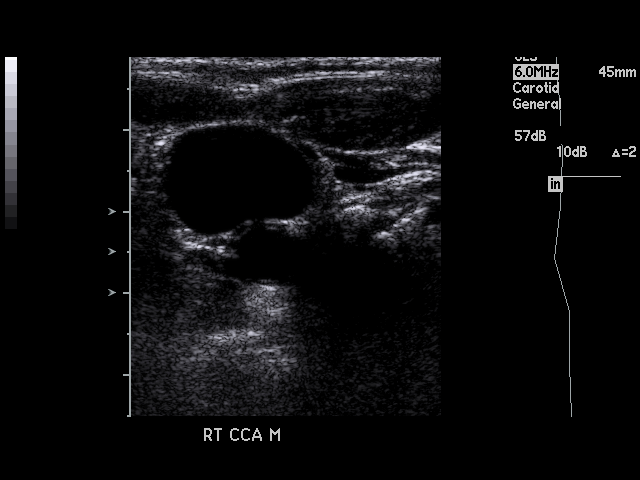
[im 8/62]
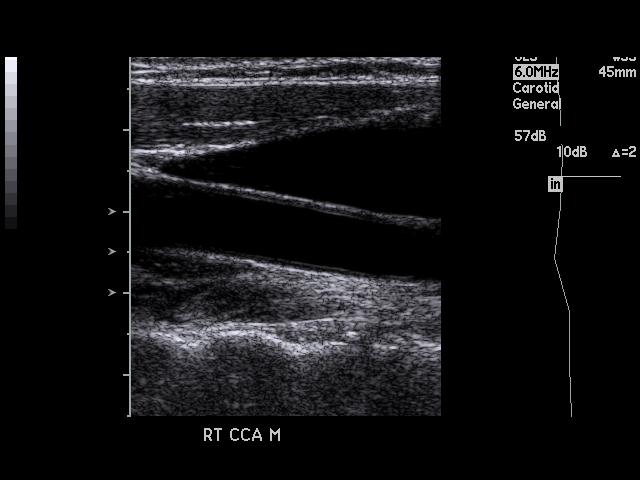
[im 11/62]
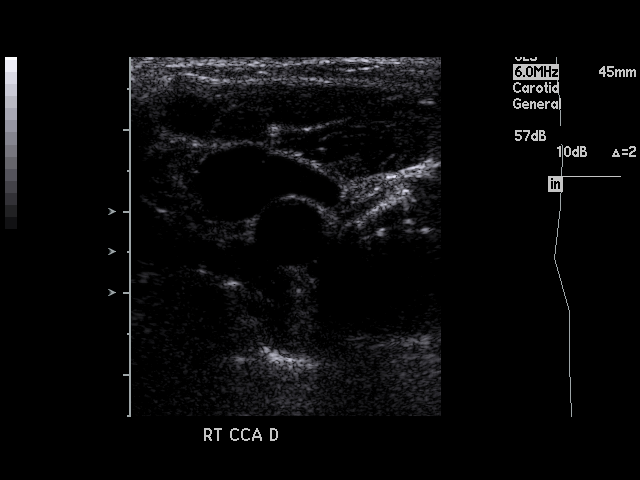
[im 16/62]
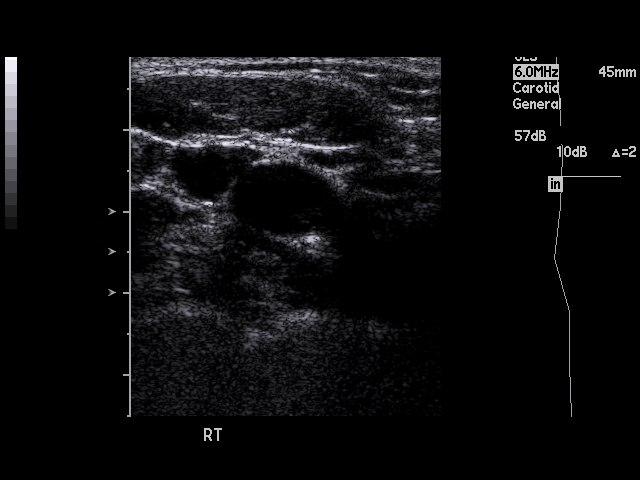
[im 19/62]
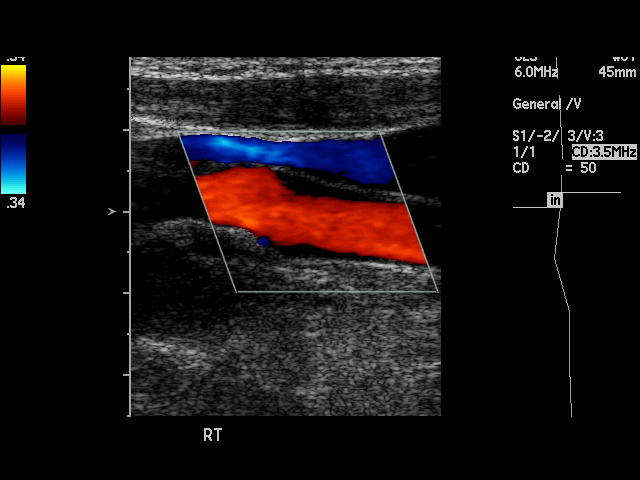
[im 24/62]
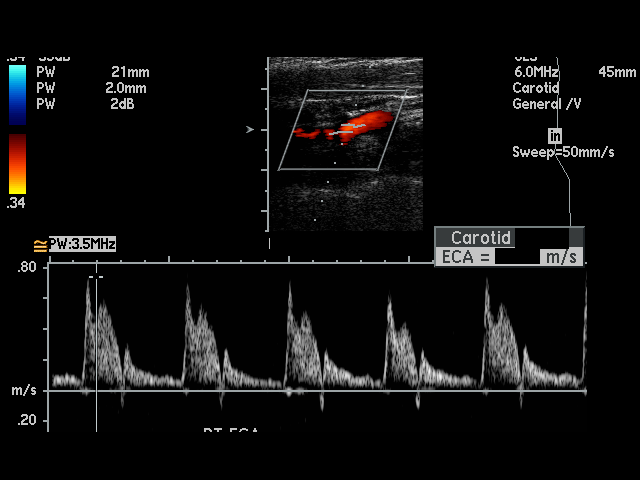
[im 27/62]
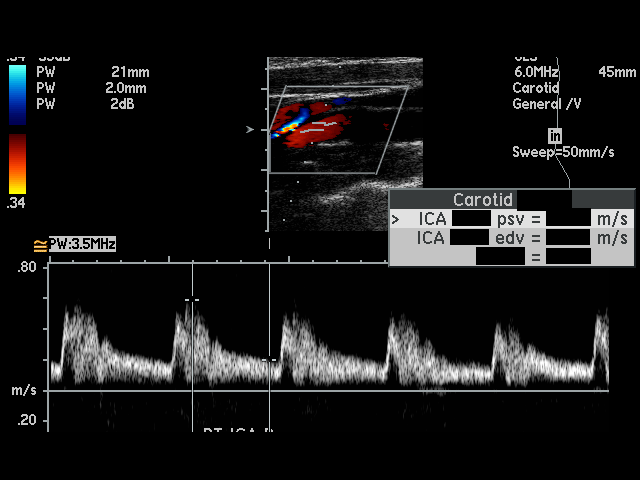
[im 32/62]
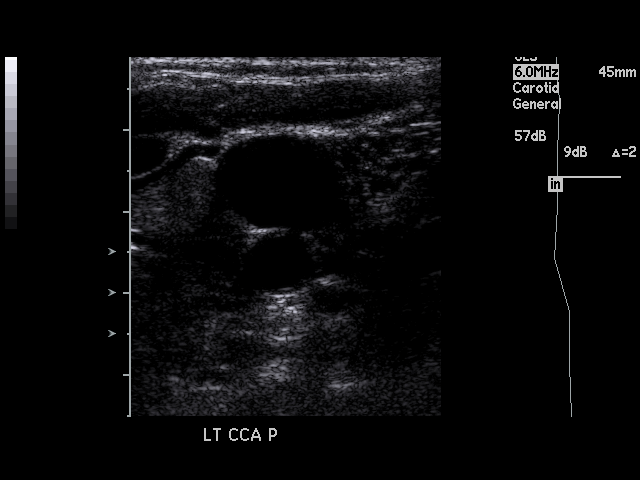
[im 35/62]
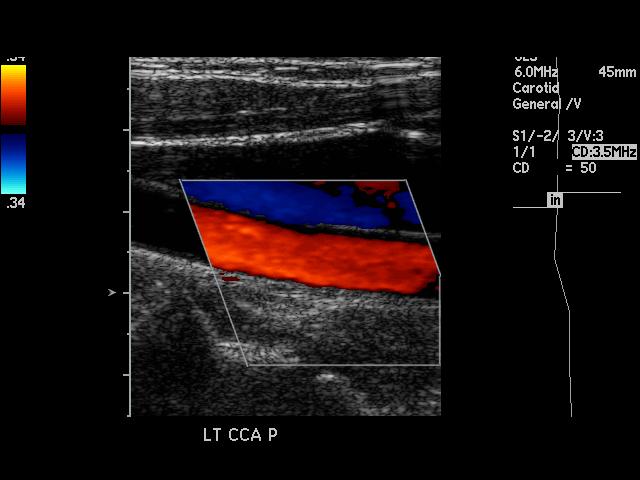
[im 38/62]
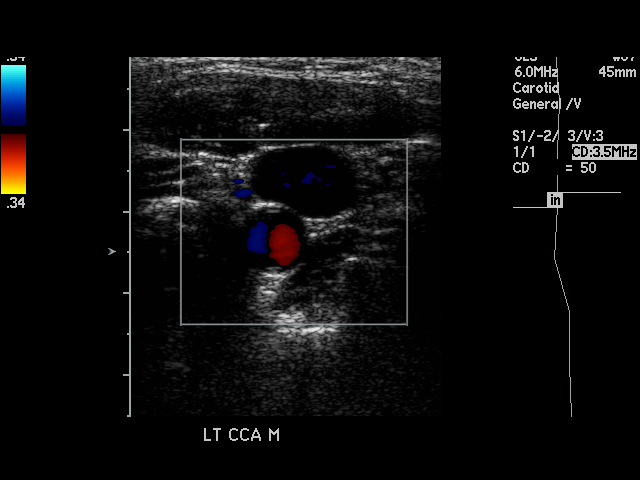
[im 43/62]
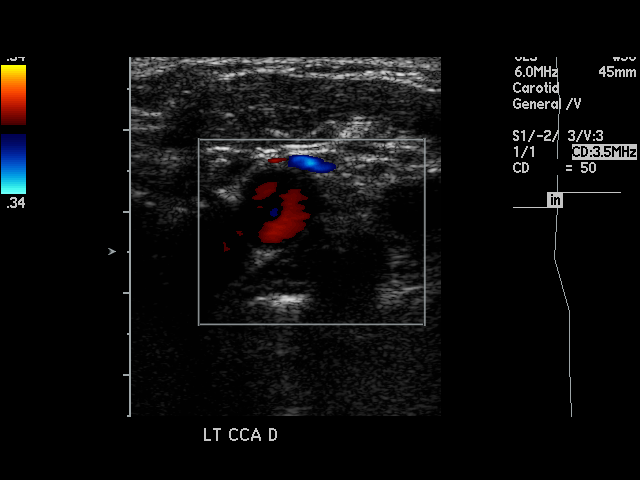
[im 46/62]
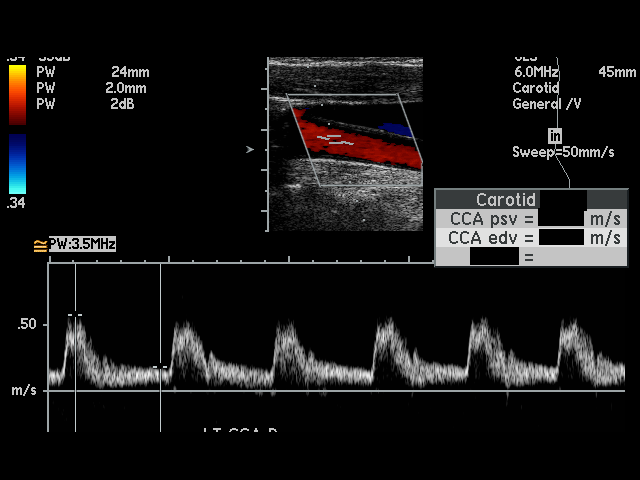
[im 51/62]
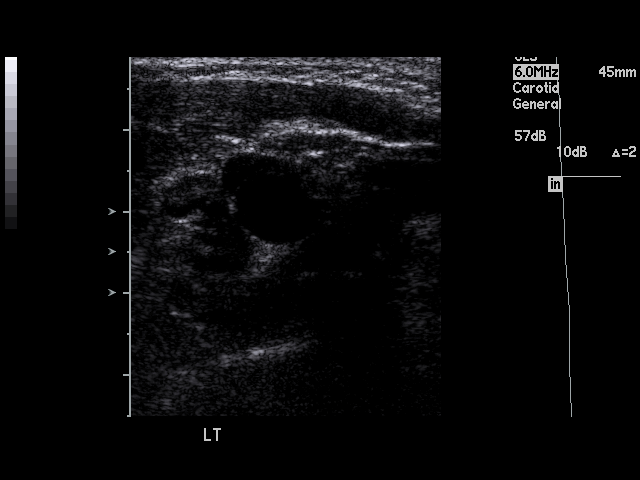
[im 54/62]
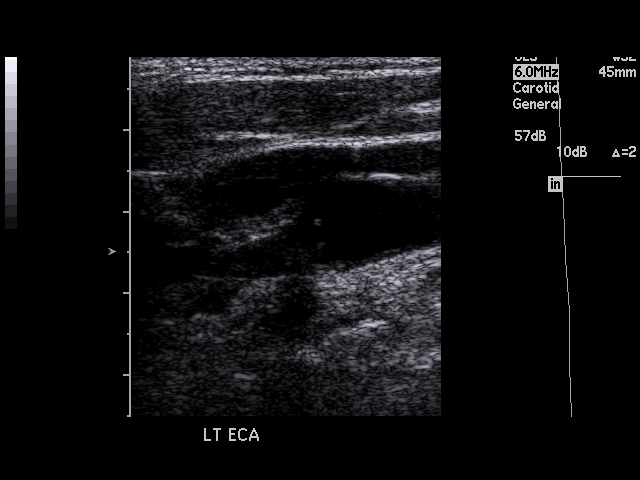
[im 56/62]
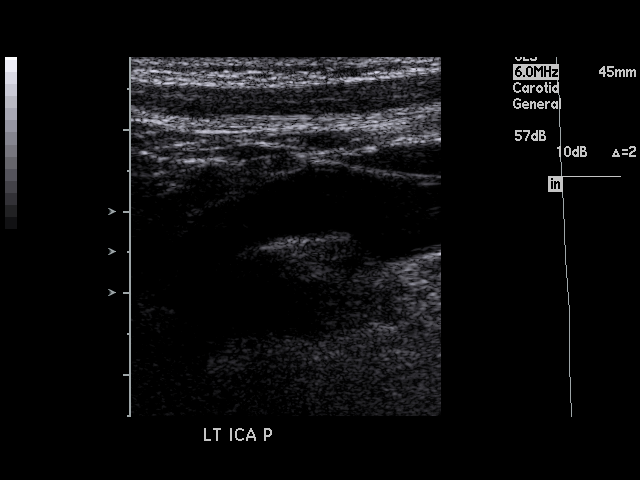
[im 62/62]
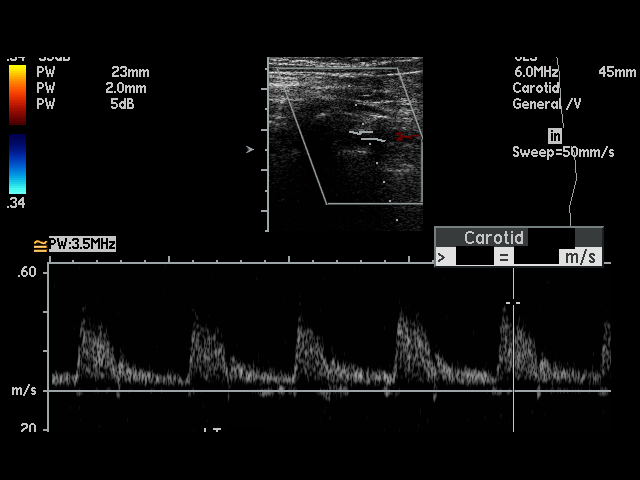

[17 of 24 positions shown; findings below may reference images not displayed]

PROCEDURE:     US  - US CAROTID DOPPLER BILATERAL  - August 02, 2009  [DATE]

RESULT:     Carotid Doppler evaluation demonstrates no atherosclerotic
plaque. The color and spectral Doppler appearance is normal. Antegrade flow
is present in both vertebral arteries. The peak systolic velocities are
normal. The internal to common carotid peak systolic velocity ratio is
on the right and 1.322 on the left.
IMPRESSION: 1. No evidence of hemodynamically significant stenosis.

## 2010-11-13 IMAGING — CT CT HEAD WITHOUT CONTRAST
1 series · 16 of 29 positions shown, 20 images · non-contrast
Comparison: none

REASON FOR EXAM: blurry vision
COMMENTS:

[Series 2: soft tissue · axial · 0.42mm/px · z∈[-167,-37]mm · 16 of 29 slices shown, 20 images]
[im 2/29  brain]
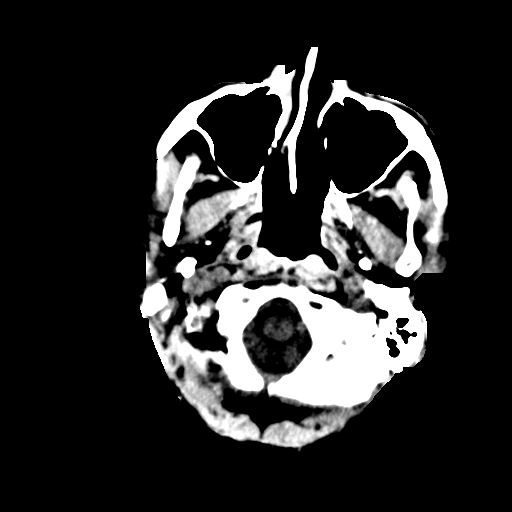
[im 2/29  bone]
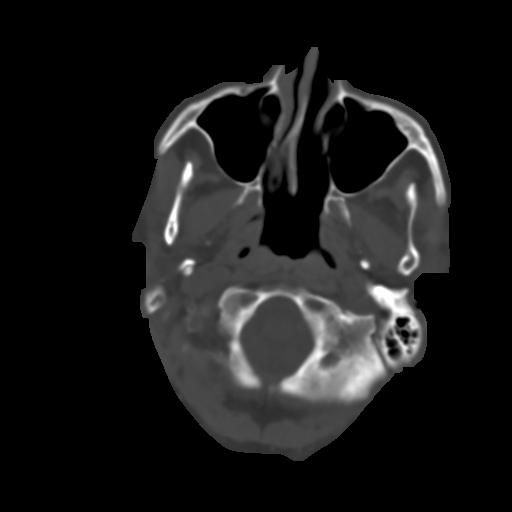
[im 4/29  brain]
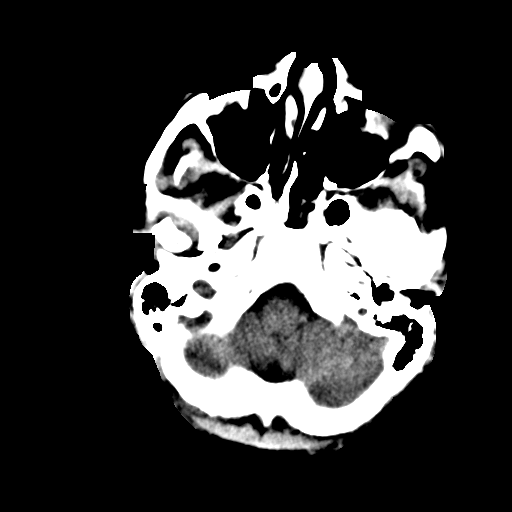
[im 6/29  brain]
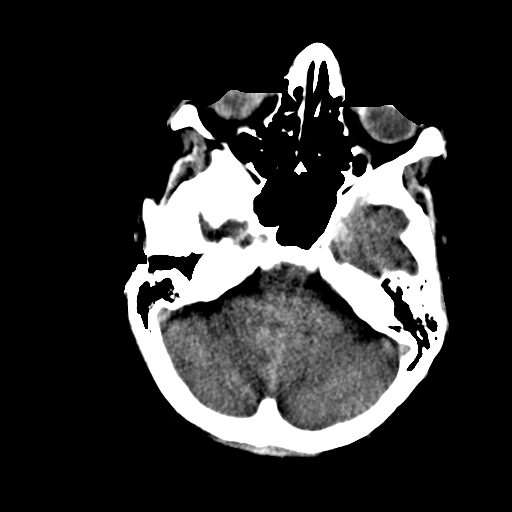
[im 7/29  brain]
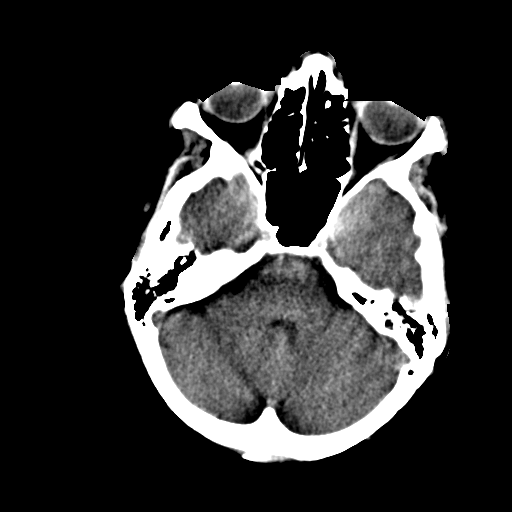
[im 9/29  brain]
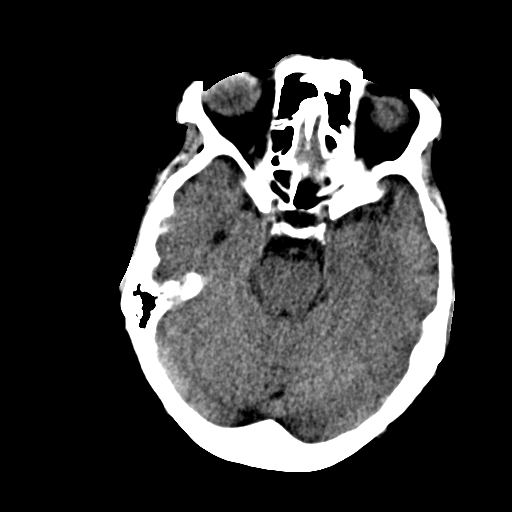
[im 9/29  bone]
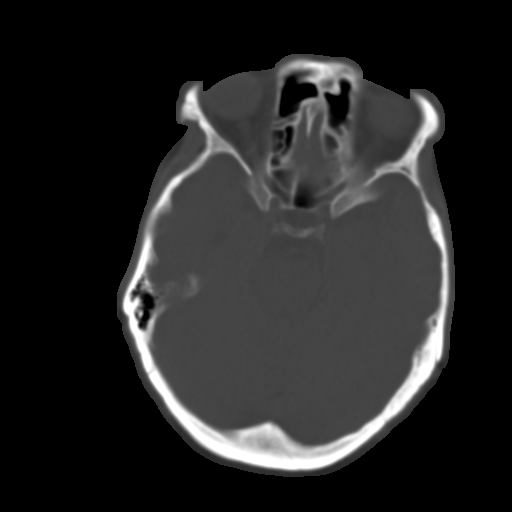
[im 11/29  brain]
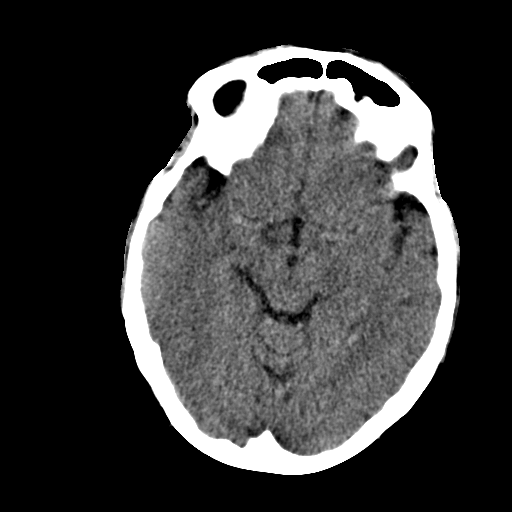
[im 12/29  brain]
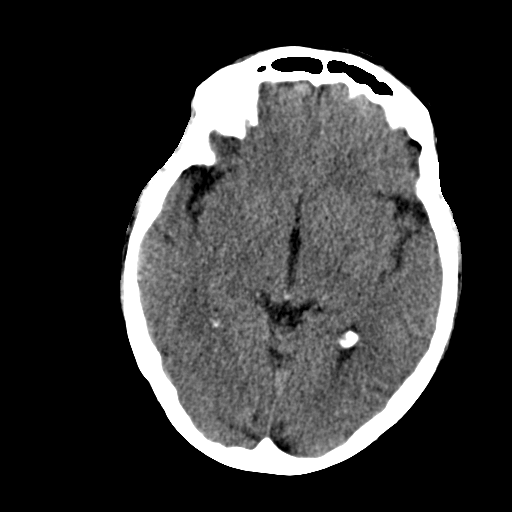
[im 14/29  brain]
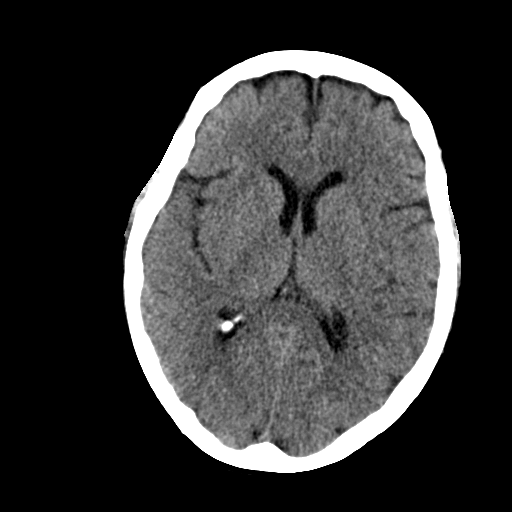
[im 16/29  brain]
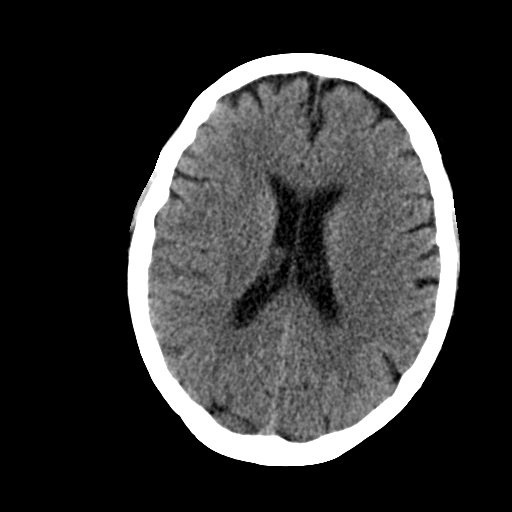
[im 16/29  bone]
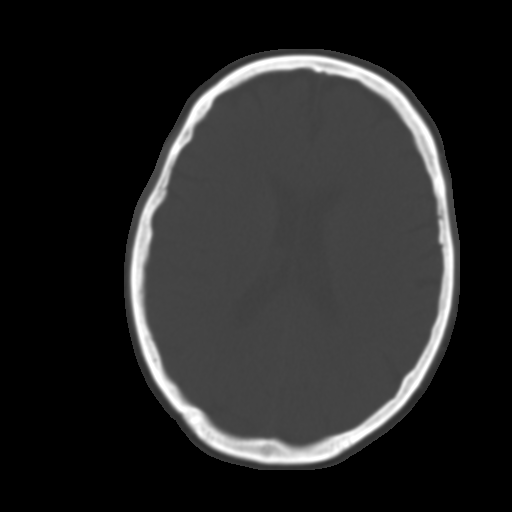
[im 18/29  brain]
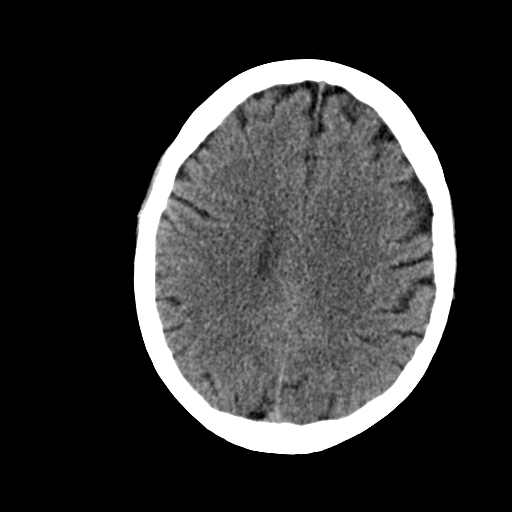
[im 19/29  brain]
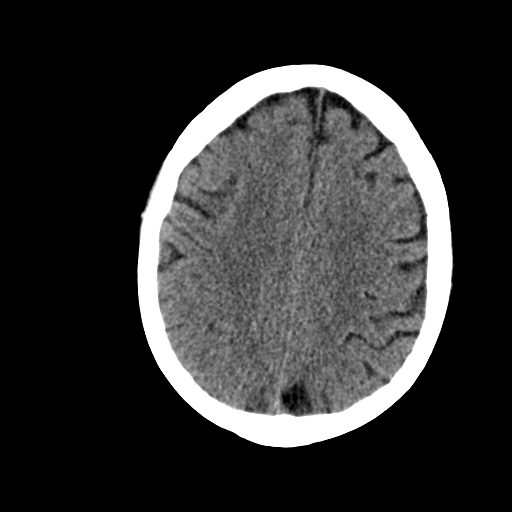
[im 21/29  brain]
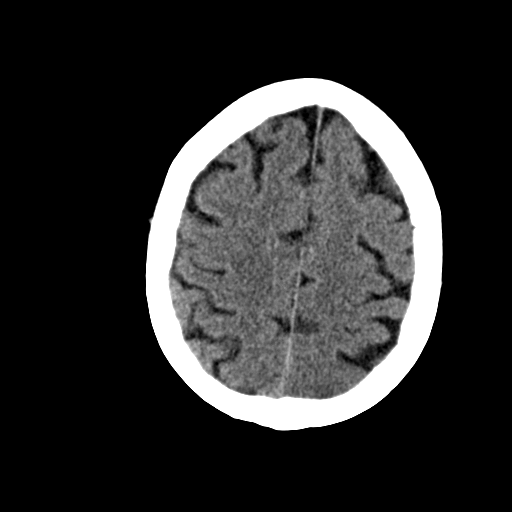
[im 23/29  brain]
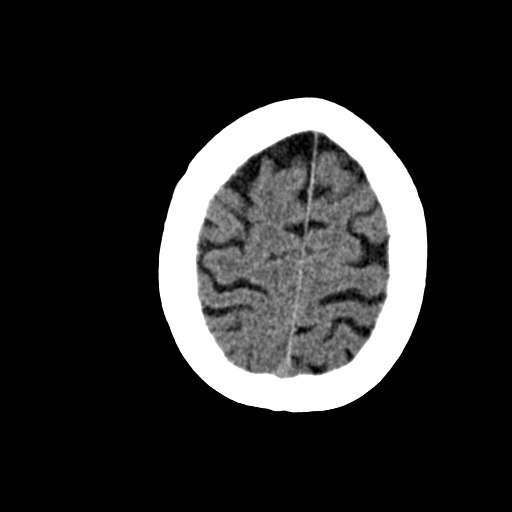
[im 23/29  bone]
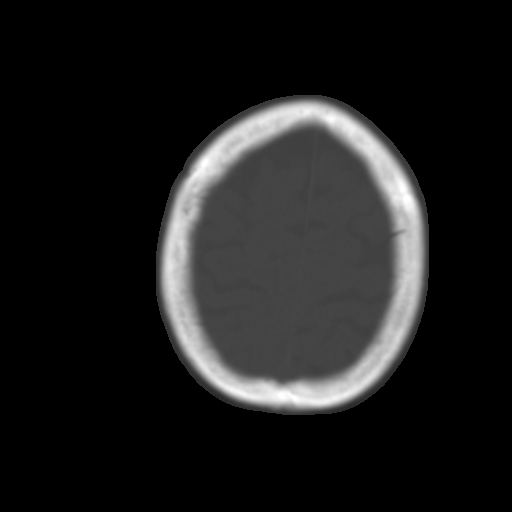
[im 24/29  brain]
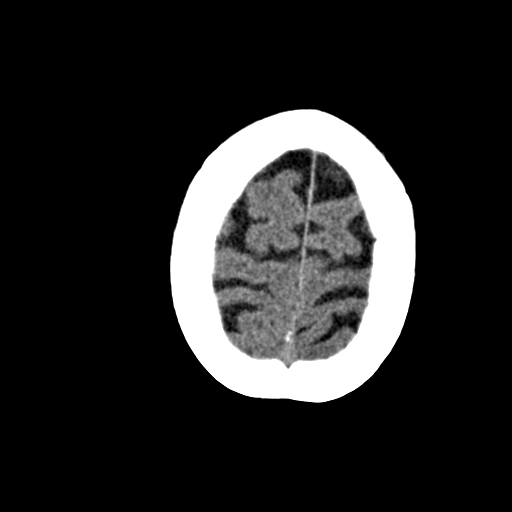
[im 26/29  brain]
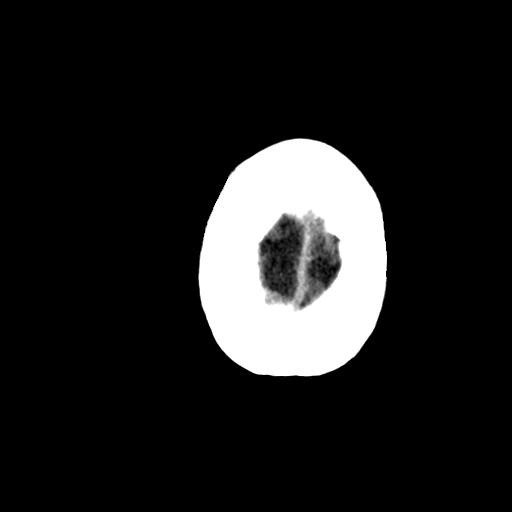
[im 28/29  brain]
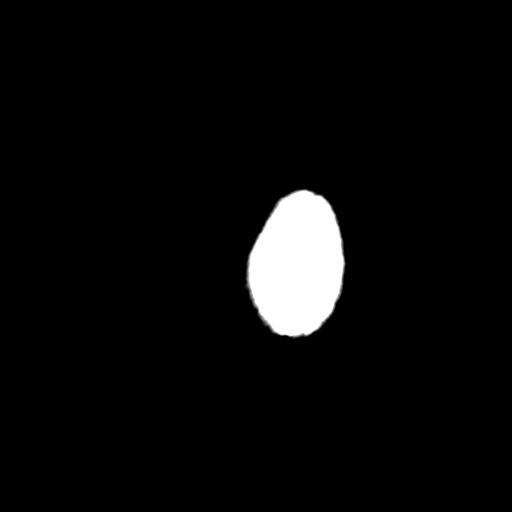

[16 of 29 positions shown; findings below may reference images not displayed]

PROCEDURE:     CT  - CT HEAD WITHOUT CONTRAST  - August 02, 2009  [DATE]

RESULT:     Axial noncontrast CT scanning was performed through the brain at
5 mm intervals and slice thicknesses.

The ventricles are normal in size and position. There is no intracranial
hemorrhage nor intracranial mass effect. The cerebellum and brainstem are
normal in density. There is no evidence of an evolving ischemic infarction.

At bone window settings I do not see evidence of an acute skull fracture.
IMPRESSION: I see no acute intracranial abnormality. There are very
mild age-appropriate atrophic changes present.

## 2010-11-13 IMAGING — CR DG CHEST 1V PORT
1 series · 2 of 2 positions shown · non-contrast
Comparison: none

REASON FOR EXAM: Chest Pain
COMMENTS:

[Series 1: view not recorded · 0.17mm/px · 2 of 2 slices shown]
[im 1/2]
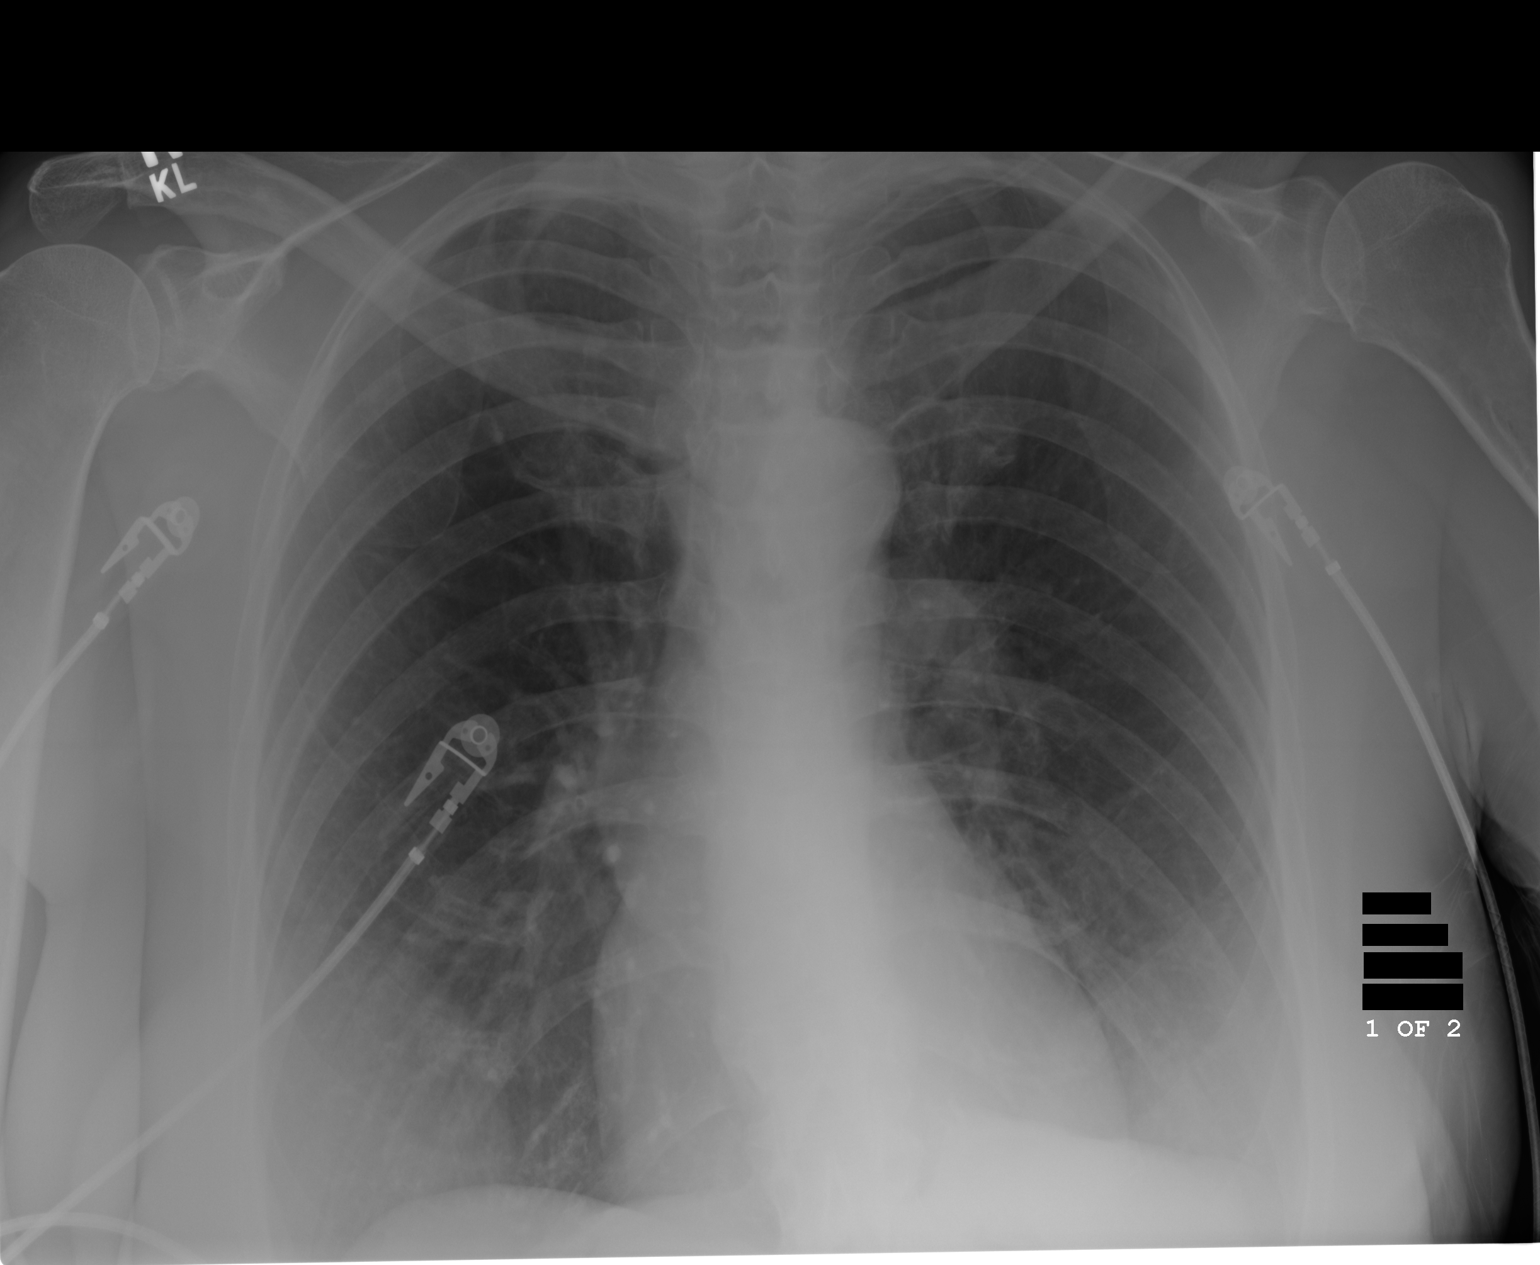
[im 2/2]
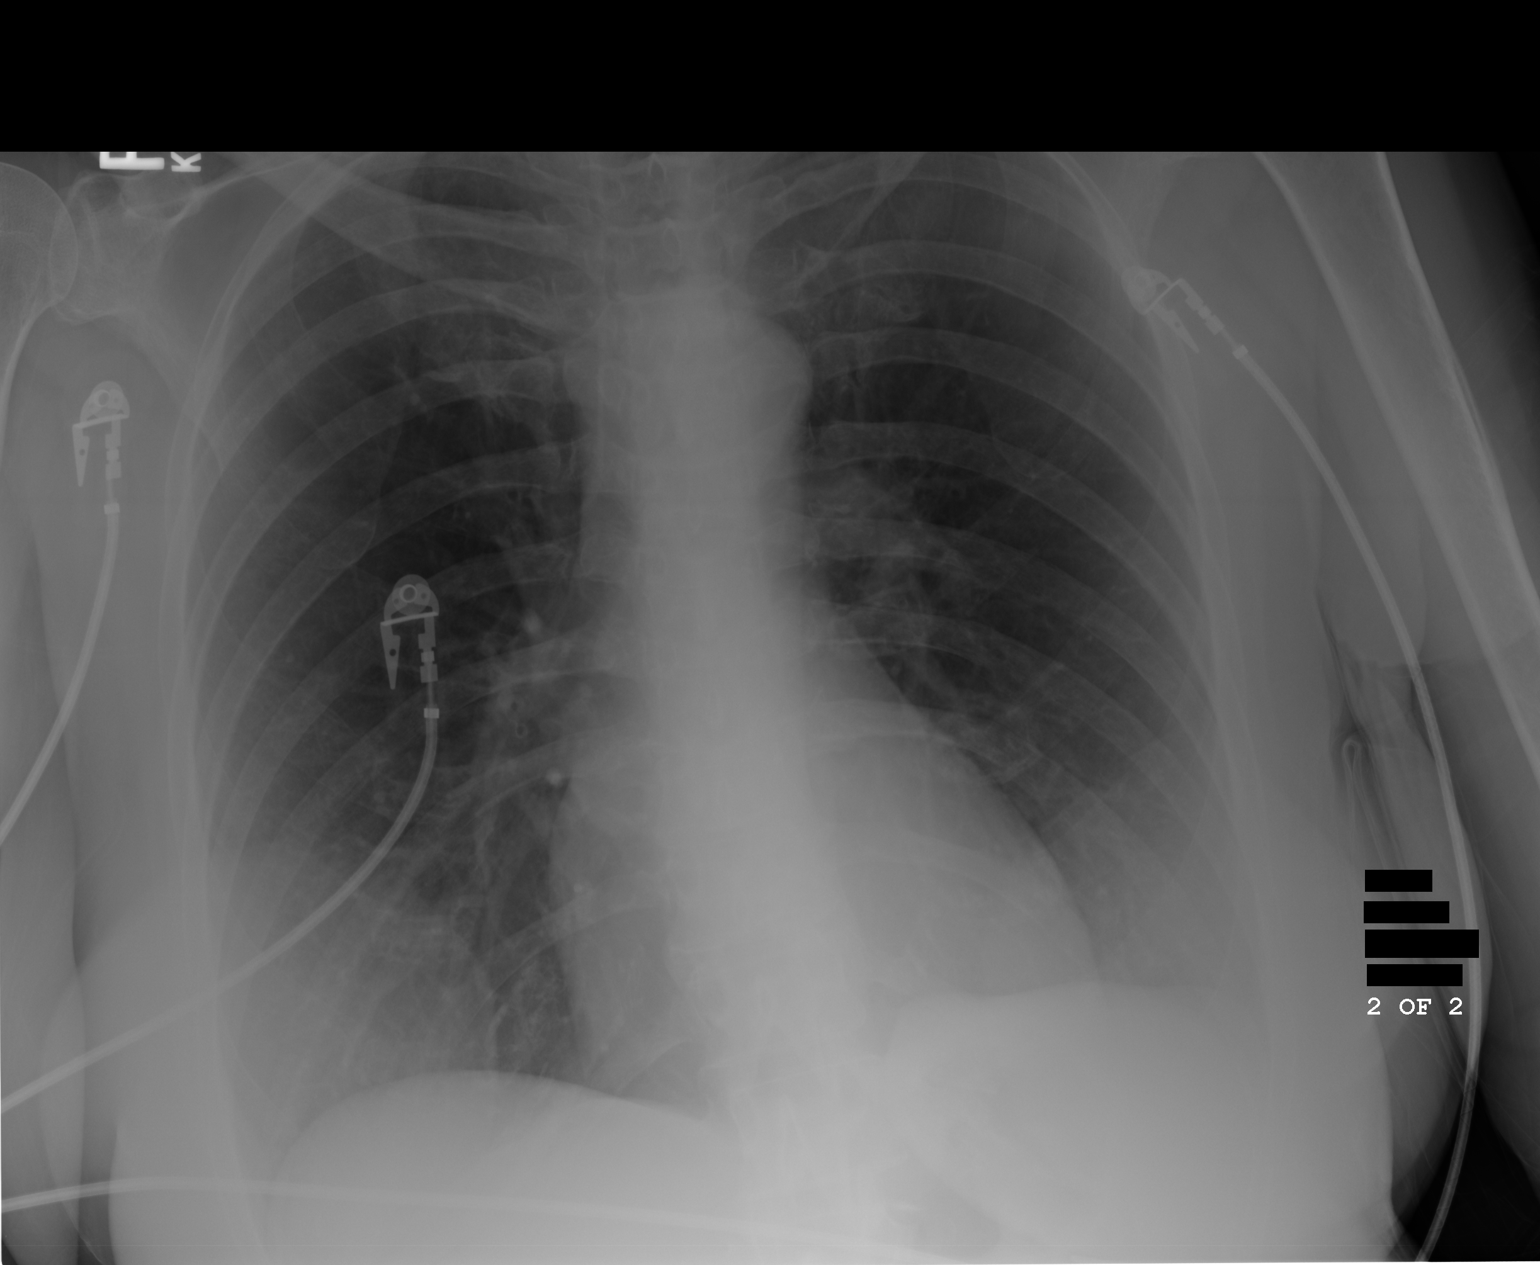

[2 of 2 positions shown; findings below may reference images not displayed]

PROCEDURE:     DXR - DXR PORTABLE CHEST SINGLE VIEW  - August 02, 2009 [DATE]

RESULT:     Comparison is made to the study of 05/19/2008.

The cardiac silhouette appears normal. The lungs are hyperinflated
consistent with COPD. There is no edema, infiltrate, effusion or
pneumothorax. Monitoring electrodes are present.
IMPRESSION: No acute cardiopulmonary disease. COPD is likely present.

## 2010-11-18 ENCOUNTER — Ambulatory Visit (INDEPENDENT_AMBULATORY_CARE_PROVIDER_SITE_OTHER): Payer: Medicare Other | Admitting: Family Medicine

## 2010-11-18 DIAGNOSIS — E538 Deficiency of other specified B group vitamins: Secondary | ICD-10-CM

## 2010-11-18 MED ORDER — CYANOCOBALAMIN 1000 MCG/ML IJ SOLN
1000.0000 ug | Freq: Once | INTRAMUSCULAR | Status: AC
  Administered 2010-11-18: 1000 ug via INTRAMUSCULAR

## 2010-11-18 NOTE — Progress Notes (Signed)
Vitamin B12 injection given during nurse visit today. 

## 2010-12-19 ENCOUNTER — Ambulatory Visit (INDEPENDENT_AMBULATORY_CARE_PROVIDER_SITE_OTHER): Payer: Medicare Other | Admitting: *Deleted

## 2010-12-19 DIAGNOSIS — E538 Deficiency of other specified B group vitamins: Secondary | ICD-10-CM

## 2010-12-19 MED ORDER — CYANOCOBALAMIN 1000 MCG/ML IJ SOLN
1000.0000 ug | Freq: Once | INTRAMUSCULAR | Status: AC
  Administered 2010-12-19: 1000 ug via INTRAMUSCULAR

## 2010-12-19 NOTE — Progress Notes (Signed)
B12 injection given during nurse visit today. 

## 2010-12-31 ENCOUNTER — Other Ambulatory Visit: Payer: Self-pay | Admitting: *Deleted

## 2010-12-31 MED ORDER — METOPROLOL SUCCINATE ER 25 MG PO TB24: ORAL_TABLET | ORAL | Status: DC

## 2010-12-31 MED ORDER — ALPRAZOLAM 0.5 MG PO TABS: 0.5000 mg | ORAL_TABLET | Freq: Two times a day (BID) | ORAL | Status: DC

## 2010-12-31 NOTE — Telephone Encounter (Signed)
Please call in.  Please schedule pt for physical this fall.  Thanks.

## 2010-12-31 NOTE — Telephone Encounter (Signed)
Medication phoned to pharmacy.  CPE appt scheduled with patient.

## 2011-01-23 ENCOUNTER — Ambulatory Visit (INDEPENDENT_AMBULATORY_CARE_PROVIDER_SITE_OTHER): Payer: Medicare Other | Admitting: Family Medicine

## 2011-01-23 ENCOUNTER — Ambulatory Visit: Payer: Medicare Other

## 2011-01-23 ENCOUNTER — Encounter: Payer: Self-pay | Admitting: Family Medicine

## 2011-01-23 DIAGNOSIS — M62838 Other muscle spasm: Secondary | ICD-10-CM

## 2011-01-23 DIAGNOSIS — Z23 Encounter for immunization: Secondary | ICD-10-CM

## 2011-01-23 DIAGNOSIS — E538 Deficiency of other specified B group vitamins: Secondary | ICD-10-CM

## 2011-01-23 MED ORDER — CYANOCOBALAMIN 1000 MCG/ML IJ SOLN
1000.0000 ug | Freq: Once | INTRAMUSCULAR | Status: AC
  Administered 2011-01-23: 1000 ug via INTRAMUSCULAR

## 2011-01-23 MED ORDER — CYCLOBENZAPRINE HCL 10 MG PO TABS: 5.0000 mg | ORAL_TABLET | Freq: Three times a day (TID) | ORAL | Status: AC | PRN

## 2011-01-23 NOTE — Patient Instructions (Addendum)
Take the flexeril 3 times a day as needed.  I would start with a half tablet; it can make you drowsy.  Keep stretching and using the heating pad.  Take care.

## 2011-01-23 NOTE — Progress Notes (Signed)
L sided neck pain.  Got up and was fixing breakfast and the pain started Tuesday AM.  No trauma, no falls.  Pain with head movement.  Used local heat.  She's trying to keep rom with exercise/stretch.  Taking no meds for it.    Meds, vitals, and allergies reviewed.   ROS: See HPI.  Otherwise, noncontributory.  nad ncat Mmm Neck w/o midline or R sided pain.  L trap ttp Normal rom at the shoulders No local rash on neck rom limited by pain, but no stiff neck

## 2011-01-24 ENCOUNTER — Encounter: Payer: Self-pay | Admitting: Family Medicine

## 2011-01-24 DIAGNOSIS — M62838 Other muscle spasm: Secondary | ICD-10-CM | POA: Insufficient documentation

## 2011-01-24 NOTE — Assessment & Plan Note (Signed)
Use flexeril, sedation caution.  Local heat.  Stretch, f/u prn.  Nontoxic. No need to image.

## 2011-02-17 ENCOUNTER — Other Ambulatory Visit: Payer: Self-pay | Admitting: Family Medicine

## 2011-02-17 DIAGNOSIS — E538 Deficiency of other specified B group vitamins: Secondary | ICD-10-CM

## 2011-02-17 DIAGNOSIS — I10 Essential (primary) hypertension: Secondary | ICD-10-CM

## 2011-02-18 ENCOUNTER — Ambulatory Visit: Payer: Medicare Other

## 2011-02-18 ENCOUNTER — Other Ambulatory Visit (INDEPENDENT_AMBULATORY_CARE_PROVIDER_SITE_OTHER): Payer: Medicare Other

## 2011-02-18 ENCOUNTER — Ambulatory Visit (INDEPENDENT_AMBULATORY_CARE_PROVIDER_SITE_OTHER): Payer: Medicare Other | Admitting: Family Medicine

## 2011-02-18 ENCOUNTER — Encounter: Payer: Self-pay | Admitting: Family Medicine

## 2011-02-18 DIAGNOSIS — E538 Deficiency of other specified B group vitamins: Secondary | ICD-10-CM

## 2011-02-18 DIAGNOSIS — I10 Essential (primary) hypertension: Secondary | ICD-10-CM

## 2011-02-18 DIAGNOSIS — R42 Dizziness and giddiness: Secondary | ICD-10-CM

## 2011-02-18 DIAGNOSIS — E559 Vitamin D deficiency, unspecified: Secondary | ICD-10-CM

## 2011-02-18 LAB — LIPID PANEL
Cholesterol: 124 mg/dL (ref 0–200)
HDL: 52.2 mg/dL (ref >39.00)
VLDL: 15 mg/dL (ref 0.0–40.0)

## 2011-02-18 LAB — COMPREHENSIVE METABOLIC PANEL
ALT: 18 U/L (ref 0–35)
CO2: 30 mEq/L (ref 19–32)
Creatinine, Ser: 0.9 mg/dL (ref 0.4–1.2)
GFR: 69.76 mL/min (ref >60.00)
Total Bilirubin: 0.6 mg/dL (ref 0.3–1.2)

## 2011-02-18 MED ORDER — CYANOCOBALAMIN 1000 MCG/ML IJ SOLN
1000.0000 ug | Freq: Once | INTRAMUSCULAR | Status: AC
  Administered 2011-02-18: 1000 ug via INTRAMUSCULAR

## 2011-02-18 MED ORDER — MECLIZINE HCL 12.5 MG PO TABS: ORAL_TABLET | ORAL | Status: DC

## 2011-02-18 NOTE — Assessment & Plan Note (Signed)
Injection due today. Will have her get it while here.

## 2011-02-18 NOTE — Assessment & Plan Note (Signed)
Symptoms and exam c/w vertigo. Use Antivert regularly for a few days to a week and get PT for manuevers to resolve/alleviate symptoms. RTC if sxs continue. Sxs could be intensified today due to needing B12 shot today as well.

## 2011-02-18 NOTE — Progress Notes (Signed)
  Subjective:    Patient ID: Mckenzie Mclaughlin, female    DOB: Jun 03, 1940, 70 y.o.   MRN: [id number]  HPI Pt of Dr Lianne Bushy, formerly of mine, who is here as and acute appt for fever, dizziness and nausea. She was last seen by Dr Para March one month ago for muscle spasms and B12 replacement and get B12 shots regularly monthly.  She has been getting dizzy and nauseated since Monday with the room spinning. When she first gets up she is real swimmy headed and then gets better but has spells that worsen. She also thinks her heart beats faster but she has lost weight over the last couple of years without effort to lose.     Review of SystemsNoncontributory except as above.      Objective:   Physical Exam  Constitutional: She is oriented to person, place, and time. She appears well-developed and well-nourished. No distress.  HENT:  Head: Normocephalic and atraumatic.  Right Ear: External ear normal.  Left Ear: External ear normal.  Nose: Nose normal.  Mouth/Throat: Oropharynx is clear and moist. No oropharyngeal exudate.  Eyes: Conjunctivae and EOM are normal. Pupils are equal, round, and reactive to light.  Neck: Normal range of motion. Neck supple. No thyromegaly present.  Cardiovascular: Normal rate, regular rhythm and normal heart sounds.   Pulmonary/Chest: Effort normal and breath sounds normal. She has no wheezes. She has no rales.  Lymphadenopathy:    She has no cervical adenopathy.  Neurological: She is alert and oriented to person, place, and time. She has normal reflexes. She displays normal reflexes. No cranial nerve deficit. She exhibits normal muscle tone. Coordination normal.       HTS, FTN, RAM, Heel walk, Toe walk, Romberg, Tandem Gait all nml. Mild lateral nystagmus with EOM testing.  Skin: She is not diaphoretic.          Assessment & Plan:

## 2011-02-18 NOTE — Patient Instructions (Signed)
Take Antivert as needed for dizziness. Refer for PT for vertigo maneuvers.

## 2011-02-18 NOTE — Assessment & Plan Note (Signed)
Adequate control. Cont curr meds. BP Readings from Last 3 Encounters:  02/18/11 130/72  01/23/11 130/84  06/06/10 130/76

## 2011-02-20 LAB — VITAMIN B12: Vitamin B-12: 447 pg/mL (ref 211–911)

## 2011-02-24 ENCOUNTER — Ambulatory Visit (INDEPENDENT_AMBULATORY_CARE_PROVIDER_SITE_OTHER): Payer: Medicare Other | Admitting: Family Medicine

## 2011-02-24 ENCOUNTER — Encounter: Payer: Self-pay | Admitting: Family Medicine

## 2011-02-24 VITALS — BP 130/80 | HR 78 | Temp 97.6°F | Wt 141.1 lb

## 2011-02-24 DIAGNOSIS — Z9289 Personal history of other medical treatment: Secondary | ICD-10-CM

## 2011-02-24 DIAGNOSIS — Z9189 Other specified personal risk factors, not elsewhere classified: Secondary | ICD-10-CM

## 2011-02-24 DIAGNOSIS — I10 Essential (primary) hypertension: Secondary | ICD-10-CM

## 2011-02-24 DIAGNOSIS — F17200 Nicotine dependence, unspecified, uncomplicated: Secondary | ICD-10-CM

## 2011-02-24 DIAGNOSIS — E785 Hyperlipidemia, unspecified: Secondary | ICD-10-CM

## 2011-02-24 DIAGNOSIS — Z13820 Encounter for screening for osteoporosis: Secondary | ICD-10-CM

## 2011-02-24 DIAGNOSIS — I2510 Atherosclerotic heart disease of native coronary artery without angina pectoris: Secondary | ICD-10-CM

## 2011-02-24 DIAGNOSIS — K5900 Constipation, unspecified: Secondary | ICD-10-CM

## 2011-02-24 DIAGNOSIS — J449 Chronic obstructive pulmonary disease, unspecified: Secondary | ICD-10-CM

## 2011-02-24 DIAGNOSIS — E538 Deficiency of other specified B group vitamins: Secondary | ICD-10-CM

## 2011-02-24 DIAGNOSIS — Z780 Asymptomatic menopausal state: Secondary | ICD-10-CM

## 2011-02-24 MED ORDER — DOCUSATE SODIUM 100 MG PO CAPS: 100.0000 mg | ORAL_CAPSULE | Freq: Every day | ORAL | Status: AC | PRN

## 2011-02-24 MED ORDER — METOPROLOL SUCCINATE ER 25 MG PO TB24: ORAL_TABLET | ORAL | Status: DC

## 2011-02-24 MED ORDER — SIMVASTATIN 10 MG PO TABS: 10.0000 mg | ORAL_TABLET | Freq: Every day | ORAL | Status: DC

## 2011-02-24 MED ORDER — NITROGLYCERIN 0.4 MG SL SUBL: 0.4000 mg | SUBLINGUAL_TABLET | SUBLINGUAL | Status: DC | PRN

## 2011-02-24 NOTE — Progress Notes (Signed)
Vertigo is much better, essentially resolved.  She hasn't gone to PT yet, and may end up cancelling the appointment. It may be worth going to PT to get them to demonstrate the exercises.    Constipation.  Hard stools. She may go several days w/o BM.  No meds yet.  "I'm not getting enough fiber."    Elevated Cholesterol: Using medications without problems:yes Muscle aches: no Diet compliance: yes Exercise: some, limited by pulm disease and back pain  COPD Feels symptoms are well controlled: yes Using medications without problems:yes Night time symptoms: no Sputum production at baseline: yes Increased cough:no Increased SOB: occ, with exertion and weather changes Using O2: occ  Anxiety.  Had been taking xanax bid.  Good relief, no ADE, didn't tolerate SSRI prev.   Hypertension:    Using medication without problems or lightheadedness: yes Chest pain with exertion:no Edema:no Short of breath: as above  Due for DXA.    Mammogram and breast exam declined.  No masses on self check.   Meds, vitals, and allergies reviewed.   PMH and SH reviewed  ROS: See HPI.  Otherwise negative.    GEN: nad, alert and oriented HEENT: mucous membranes moist NECK: supple w/o LA CV: rrr. PULM: ctab, no inc wob ABD: soft, +bs EXT: no edema SKIN: no acute rash

## 2011-02-24 NOTE — Patient Instructions (Addendum)
You can take colace and/or metamucil for constipation. Check with your insurance to see if they will cover the shingles shot. See Shirlee Limerick about your referral before you leave today. Take care.  Glad to see you.

## 2011-02-25 ENCOUNTER — Encounter: Payer: Self-pay | Admitting: Family Medicine

## 2011-02-26 DIAGNOSIS — E785 Hyperlipidemia, unspecified: Secondary | ICD-10-CM | POA: Insufficient documentation

## 2011-02-26 DIAGNOSIS — Z13820 Encounter for screening for osteoporosis: Secondary | ICD-10-CM | POA: Insufficient documentation

## 2011-02-26 DIAGNOSIS — Z9289 Personal history of other medical treatment: Secondary | ICD-10-CM | POA: Insufficient documentation

## 2011-02-26 DIAGNOSIS — Z9889 Other specified postprocedural states: Secondary | ICD-10-CM | POA: Insufficient documentation

## 2011-02-26 NOTE — Assessment & Plan Note (Signed)
D/w pt

## 2011-02-26 NOTE — Assessment & Plan Note (Signed)
Continue current meds, d/w pt about smoking.  No CP.

## 2011-02-26 NOTE — Assessment & Plan Note (Signed)
Controlled, labs d/w pt.  No change in meds.  

## 2011-02-26 NOTE — Assessment & Plan Note (Signed)
Controlled, labs d/w pt.  No change in meds.

## 2011-02-26 NOTE — Assessment & Plan Note (Signed)
Continue meds, d/w pt about stopping smoking

## 2011-02-26 NOTE — Assessment & Plan Note (Signed)
Add on colace and/or metamucil

## 2011-02-26 NOTE — Assessment & Plan Note (Signed)
dxa pending

## 2011-02-26 NOTE — Assessment & Plan Note (Signed)
Level normalized

## 2011-03-20 ENCOUNTER — Ambulatory Visit (INDEPENDENT_AMBULATORY_CARE_PROVIDER_SITE_OTHER): Payer: Medicare Other | Admitting: *Deleted

## 2011-03-20 DIAGNOSIS — E538 Deficiency of other specified B group vitamins: Secondary | ICD-10-CM

## 2011-03-20 MED ORDER — CYANOCOBALAMIN 1000 MCG/ML IJ SOLN
1000.0000 ug | Freq: Once | INTRAMUSCULAR | Status: AC
  Administered 2011-03-20: 1000 ug via INTRAMUSCULAR

## 2011-03-28 ENCOUNTER — Inpatient Hospital Stay: Payer: Self-pay | Admitting: Internal Medicine

## 2011-04-13 ENCOUNTER — Encounter: Payer: Self-pay | Admitting: Family Medicine

## 2011-04-13 ENCOUNTER — Ambulatory Visit (INDEPENDENT_AMBULATORY_CARE_PROVIDER_SITE_OTHER): Payer: Medicare Other | Admitting: Family Medicine

## 2011-04-13 VITALS — BP 130/80 | HR 76 | Temp 98.4°F | Wt 142.0 lb

## 2011-04-13 DIAGNOSIS — I2510 Atherosclerotic heart disease of native coronary artery without angina pectoris: Secondary | ICD-10-CM

## 2011-04-13 DIAGNOSIS — N63 Unspecified lump in unspecified breast: Secondary | ICD-10-CM

## 2011-04-13 NOTE — Progress Notes (Signed)
03/28/11 to [facility] ER, admitted with SOB and CP.  She has cards f/u, stress test done on Friday [facility] cards).  No CP now.  Overall improved, but will with occ episodes of brief CP.  She goes back 04/15/11.  Requesting records.    Now with breast mass.  Under R breast.  Noted a few days ago.  She does self exams.  About the size of a pea.  Slightly ttp.  No skin changes.  No lumps under R axilla. No L sided sx.  No bruising, no nipple changes or discharge.  No personal or family hx of breast cancer.  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    Meds, vitals, and allergies reviewed.   ROS: See HPI.  Otherwise, noncontributory.  nad ncat Mmm ctab abd exam benign, soft, not ttp Breast exam: exam c/w implants, no nodules, thickening, tenderness, bulging, retraction, inflamation, nipple discharge or skin changes noted except for R breast with small mass inferior and medial.  No axillary or clavicular LA.  Chaperoned exam.

## 2011-04-13 NOTE — Patient Instructions (Signed)
See Marion about your referral before you leave today. Take care.  Glad to see you.   

## 2011-04-17 DIAGNOSIS — N63 Unspecified lump in unspecified breast: Secondary | ICD-10-CM | POA: Insufficient documentation

## 2011-04-17 NOTE — Assessment & Plan Note (Signed)
Requesting records with f/u with cards pending.  >25 min spent with face to face with patient, >50% counseling and/or coordinating care.

## 2011-04-17 NOTE — Assessment & Plan Note (Signed)
She declined mammogram.  Refer for u/s for eval.  She agrees, would like to get through cards eval first.  She is aware of potential for adverse outcome from delay in dx, but would like to proceed as planned.

## 2011-04-20 LAB — CBC
HCT: 43.8 % (ref 35.0–47.0)
HGB: 14.7 g/dL (ref 12.0–16.0)
MCHC: 33.5 g/dL (ref 32.0–36.0)
MCV: 93 fL (ref 80–100)
Platelet: 202 10*3/uL (ref 150–440)
RBC: 4.71 10*6/uL (ref 3.80–5.20)
RDW: 12.8 % (ref 11.5–14.5)
WBC: 9.5 10*3/uL (ref 3.6–11.0)

## 2011-04-20 LAB — URINALYSIS, COMPLETE
Bilirubin,UR: NEGATIVE
Glucose,UR: NEGATIVE mg/dL (ref 0–75)
Nitrite: NEGATIVE
Ph: 5 (ref 4.5–8.0)
RBC,UR: 6 /HPF (ref 0–5)
WBC UR: 7 /HPF (ref 0–5)

## 2011-04-20 LAB — COMPREHENSIVE METABOLIC PANEL
Albumin: 3.8 g/dL (ref 3.4–5.0)
Anion Gap: 9 (ref 7–16)
BUN: 17 mg/dL (ref 7–18)
Bilirubin,Total: 0.4 mg/dL (ref 0.2–1.0)
Chloride: 102 mmol/L (ref 98–107)
Creatinine: 0.73 mg/dL (ref 0.60–1.30)
EGFR (African American): >60
Osmolality: 286 (ref 275–301)
Potassium: 4.4 mmol/L (ref 3.5–5.1)
SGPT (ALT): 22 U/L
Sodium: 143 mmol/L (ref 136–145)
Total Protein: 7.4 g/dL (ref 6.4–8.2)

## 2011-04-20 LAB — CK TOTAL AND CKMB (NOT AT ARMC): CK, Total: 104 U/L (ref 21–215)

## 2011-04-20 LAB — LIPASE, BLOOD: Lipase: 212 U/L (ref 73–393)

## 2011-04-21 ENCOUNTER — Observation Stay: Payer: Self-pay | Admitting: Internal Medicine

## 2011-04-21 LAB — TSH: Thyroid Stimulating Horm: 0.626 u[IU]/mL

## 2011-04-21 LAB — CK-MB
CK-MB: 2.3 ng/mL (ref 0.5–3.6)
CK-MB: 2.3 ng/mL (ref 0.5–3.6)

## 2011-04-21 LAB — MAGNESIUM: Magnesium: 1.8 mg/dL

## 2011-04-21 LAB — LIPID PANEL: HDL Cholesterol: 45 mg/dL (ref 40–60)

## 2011-04-22 ENCOUNTER — Encounter: Payer: Self-pay | Admitting: Family Medicine

## 2011-04-23 ENCOUNTER — Ambulatory Visit (INDEPENDENT_AMBULATORY_CARE_PROVIDER_SITE_OTHER): Payer: Medicare Other | Admitting: *Deleted

## 2011-04-23 DIAGNOSIS — E538 Deficiency of other specified B group vitamins: Secondary | ICD-10-CM

## 2011-04-23 MED ORDER — CYANOCOBALAMIN 1000 MCG/ML IJ SOLN
1000.0000 ug | Freq: Once | INTRAMUSCULAR | Status: AC
  Administered 2011-04-23: 1000 ug via INTRAMUSCULAR

## 2011-04-26 ENCOUNTER — Encounter: Payer: Self-pay | Admitting: Family Medicine

## 2011-04-26 IMAGING — CT CT CHEST W/ CM
1 series · 15 of 31 positions shown, 19 images · IV contrast (isovue)
Comparison: none

REASON FOR EXAM: pulmonary nodules
COMMENTS:

PROCEDURE:     CT  - CT CHEST WITH CONTRAST  - January 13, 2010  [DATE]
RESULT:     Comparison: 06/06/2009 and 11/01/2008
TECHNIQUE: Multiple axial images of the chest were obtained with 75 mL
Isovue 370 intravenous contrast.

[Series 2: soft tissue · axial · 0.63mm/px · z∈[-138,+226]mm · 15 of 79 slices shown, 19 images]
[im 3/79  mediastinal]
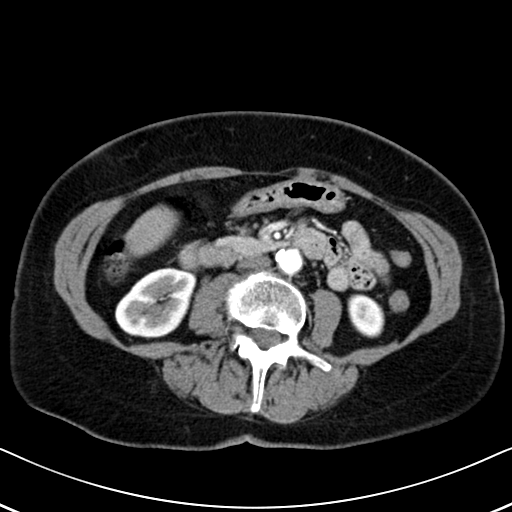
[im 3/79  lung]
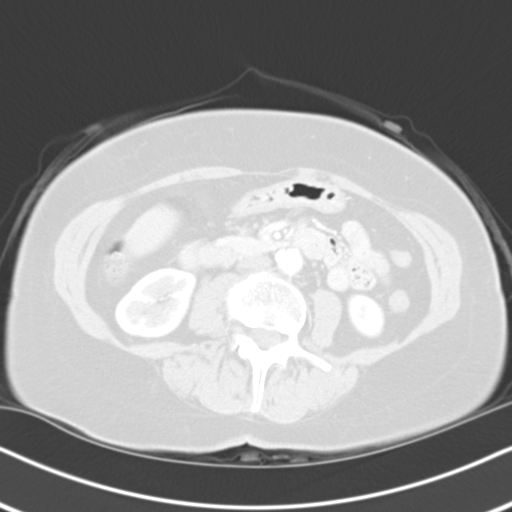
[im 9/79  lung]
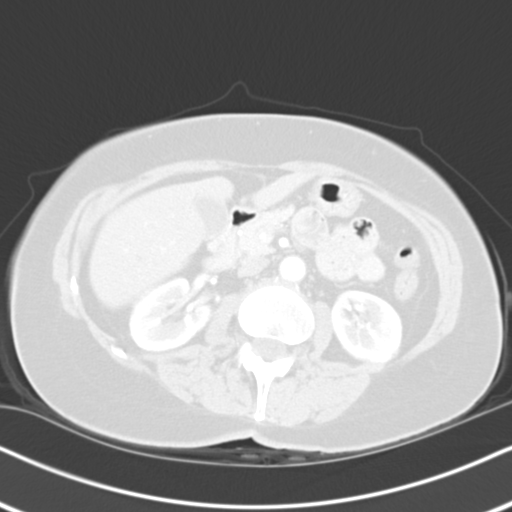
[im 15/79  lung]
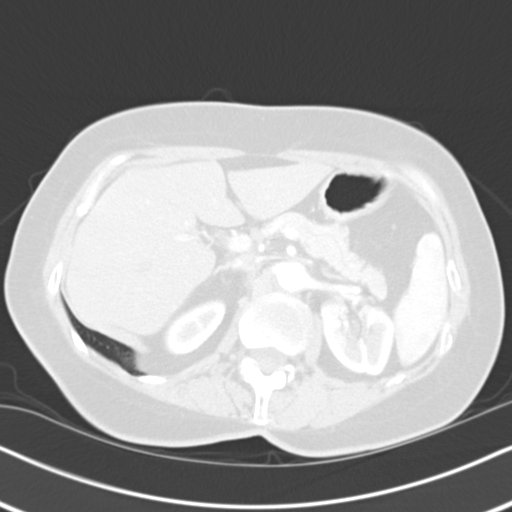
[im 18/79  lung]
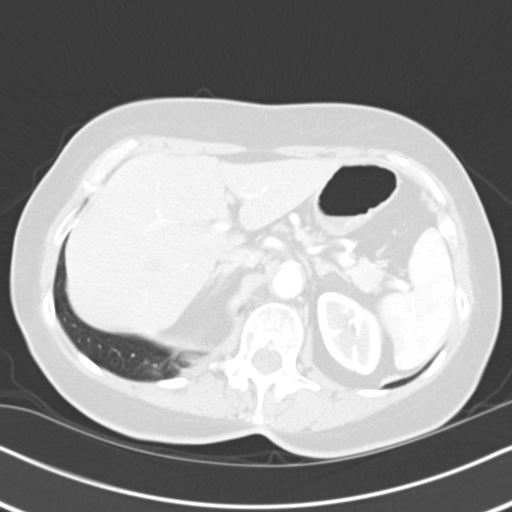
[im 24/79  mediastinal]
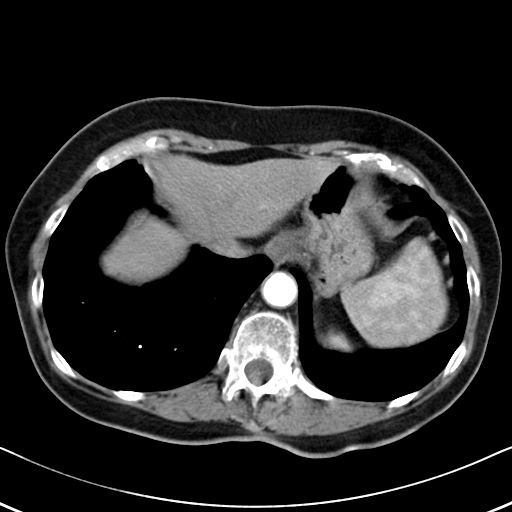
[im 24/79  lung]
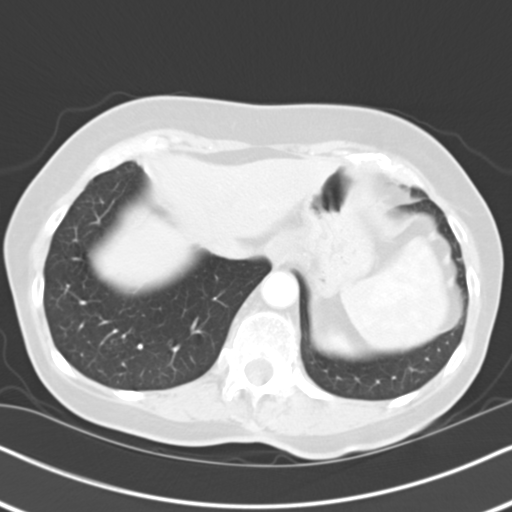
[im 29/79  lung]
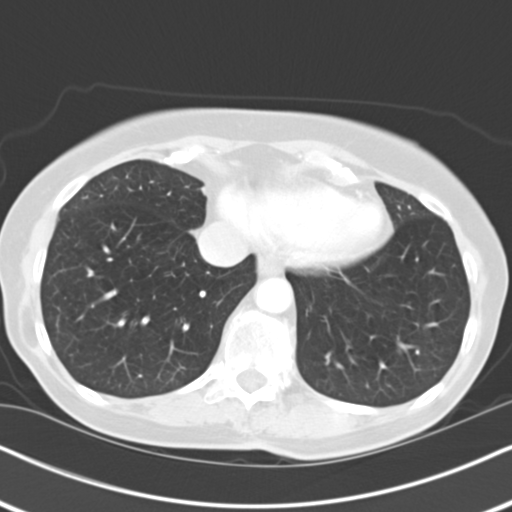
[im 35/79  lung]
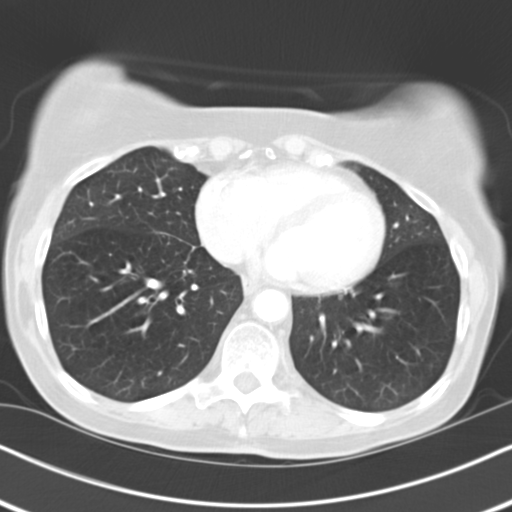
[im 41/79  lung]
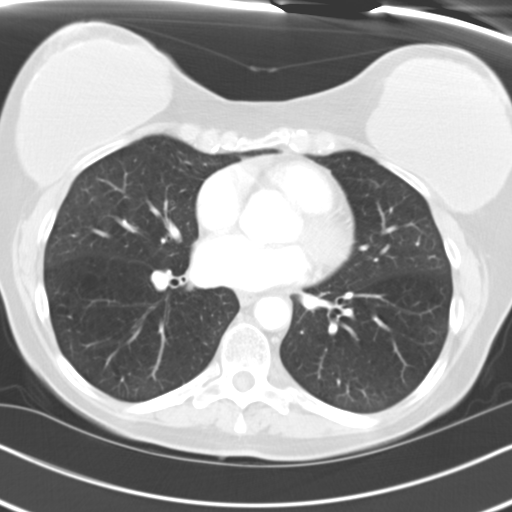
[im 44/79  mediastinal]
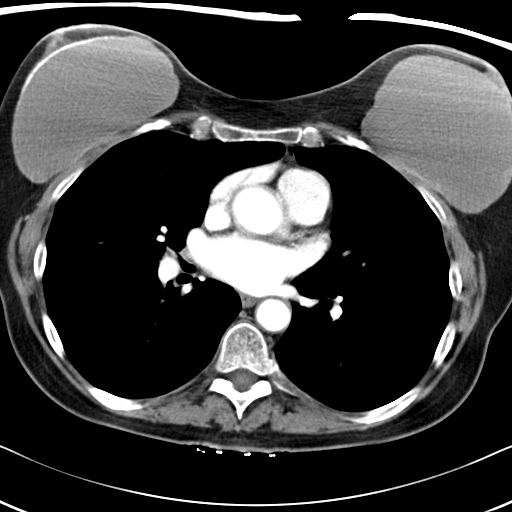
[im 44/79  lung]
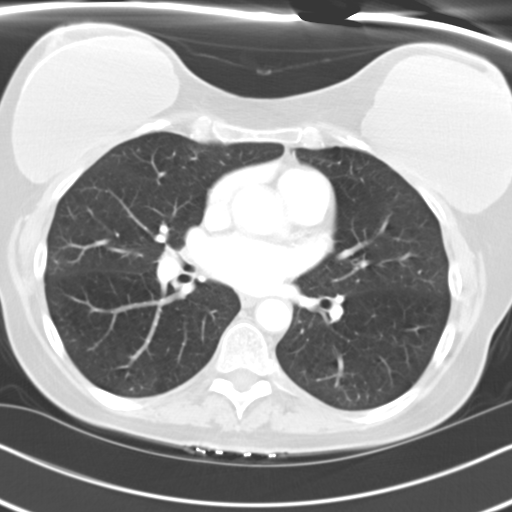
[im 50/79  lung]
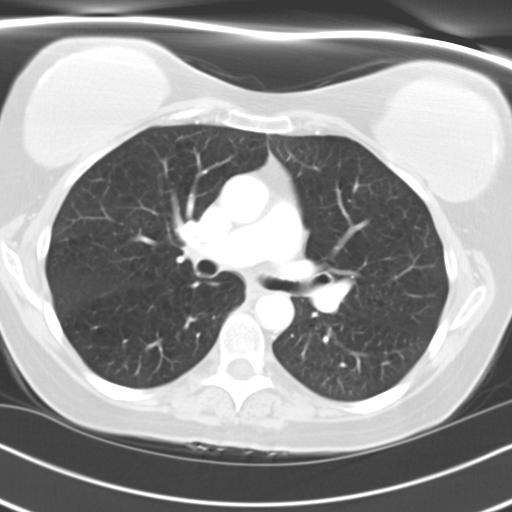
[im 55/79  lung]
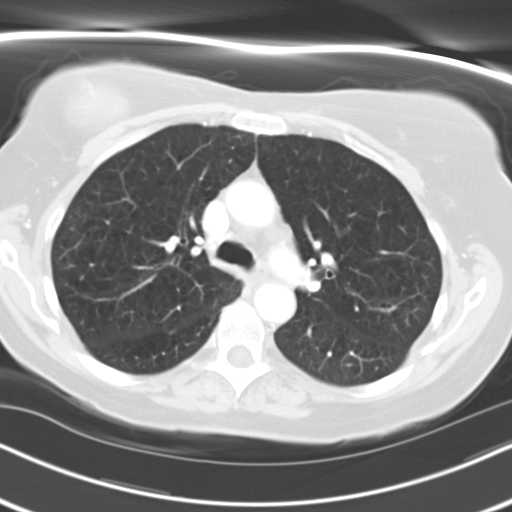
[im 61/79  lung]
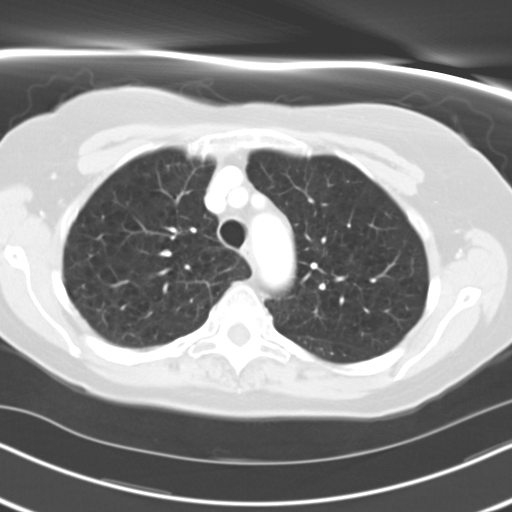
[im 64/79  mediastinal]
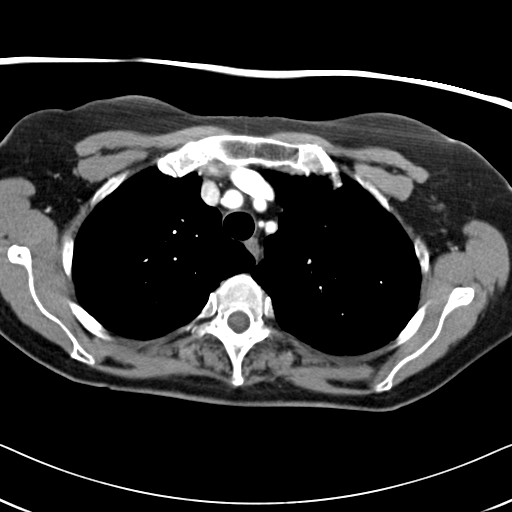
[im 64/79  lung]
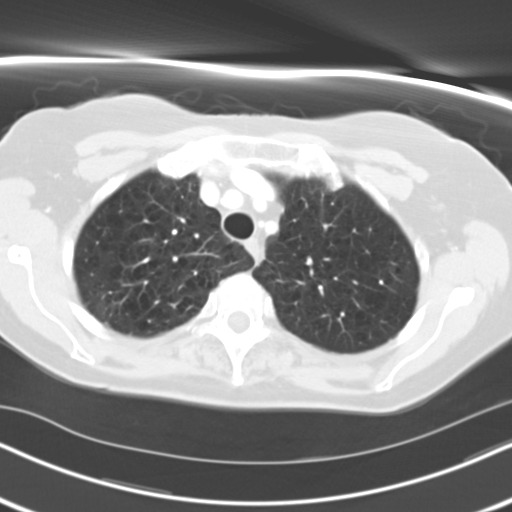
[im 70/79  lung]
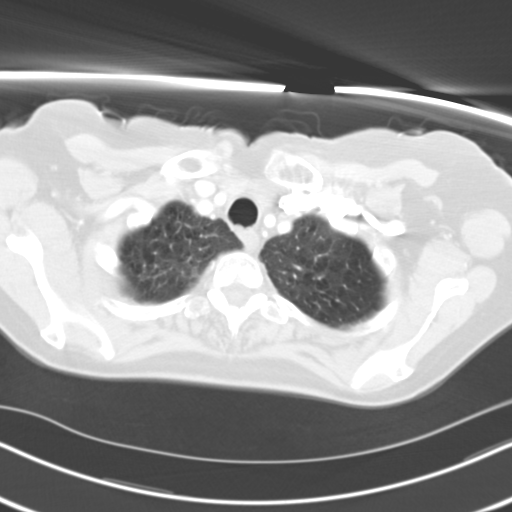
[im 76/79  lung]
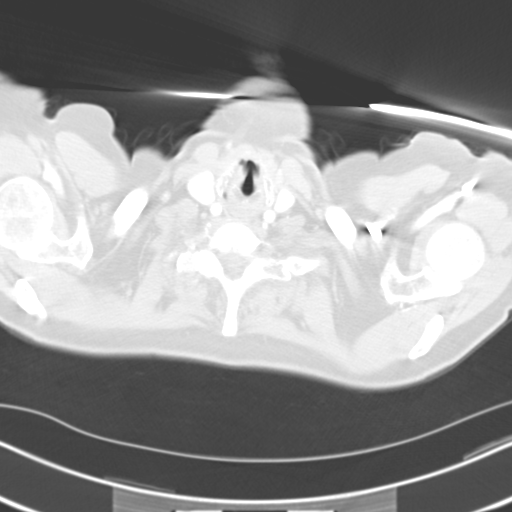

[15 of 31 positions shown; findings below may reference images not displayed]

FINDINGS: No mediastinal, hilar, or axillary lymphadenopathy identified. There are
bilateral prosthetic breast implants. Mild vascular calcifications are seen
in the thoracic aorta. Small cyst in the left hepatic lobe is unchanged.

There is centrilobular emphysema. 4 mm subpleural nodule in the right lower
lobe is unchanged from 3757. 4 mm calcified nodule in the superior right
lower lobe is unchanged. Tiny, 2 mm nodule in the periphery of the right
upper lobe is unchanged. There are small nodules in the left lung which are
calcified, and similar to prior.

No aggressive lytic or sclerotic osseous lesions identified.
IMPRESSION: Unchanged small pulmonary nodules. Most of these are calcified and likely
sequela of prior granulomatous section. As these are unchanged for at least
one year, they are considered benign.

## 2011-05-06 ENCOUNTER — Ambulatory Visit: Payer: Self-pay | Admitting: Gastroenterology

## 2011-05-07 ENCOUNTER — Ambulatory Visit: Payer: Medicare Other | Admitting: Family Medicine

## 2011-05-11 ENCOUNTER — Ambulatory Visit: Payer: Self-pay | Admitting: Family Medicine

## 2011-05-12 ENCOUNTER — Encounter: Payer: Self-pay | Admitting: Family Medicine

## 2011-05-13 ENCOUNTER — Ambulatory Visit (INDEPENDENT_AMBULATORY_CARE_PROVIDER_SITE_OTHER): Payer: Medicare Other | Admitting: Family Medicine

## 2011-05-13 ENCOUNTER — Encounter: Payer: Self-pay | Admitting: Family Medicine

## 2011-05-13 DIAGNOSIS — N644 Mastodynia: Secondary | ICD-10-CM

## 2011-05-13 DIAGNOSIS — N63 Unspecified lump in unspecified breast: Secondary | ICD-10-CM

## 2011-05-13 DIAGNOSIS — I2510 Atherosclerotic heart disease of native coronary artery without angina pectoris: Secondary | ICD-10-CM

## 2011-05-13 DIAGNOSIS — R0789 Other chest pain: Secondary | ICD-10-CM | POA: Insufficient documentation

## 2011-05-13 NOTE — Assessment & Plan Note (Signed)
Unclear source, with CAD but recent neg stress test and sx that can happen at rest (and not happen with activity).  This could be related to breast pathology, will get imaging as stated below.  Will request discharge summary and will request u/s of GB.  Appears okay for outpatient fu. >25 min spent with face to face with patient, >50% counseling and/or coordinating care

## 2011-05-13 NOTE — Assessment & Plan Note (Signed)
Refer back for diagnostic mammogram and u/s.  She'll attempt the mammogram.  She's worried about pain and trauma to implants.  I advised her to try to tolerate the procedure and if painful, stop the mammogram.  She agrees.  Referral ordered.

## 2011-05-13 NOTE — Patient Instructions (Signed)
We'll request your records.  See Shirlee Limerick about your referral before you leave today. Take care.

## 2011-05-13 NOTE — Progress Notes (Signed)
Atypical chest pain.  Still with occ chest pain, R breast pain.  Can happen at rest, irregular onset.  Not exertional.  She had another episode of chest pain and then her L 4th/5th fingers were numb.  The hand sx lasted about 2-3 days, but is back to normal now.  She was see at [facility] with that episode, stayed over night, per patient no dx given for pain.  Per patient she had an u/s done on GB 05/06/11.  We'll request those records.  She doesn't know about any results on that.  I don't have discharge summary yet.  She had unremarkable stress test with cards in [place] in 03/2011.  She has been stented prev. Smoking 1/4 ppd.   She initially refused mammogram, and she understood that her position limited diagnostic w/u.  See plan.    She had breast u/s on the 7 o'clock position on the R breast that was negative.  She still has concern about a new spot at 5 o'clock on the R breast.  Also with pain in the R nipple. Meds, vitals, and allergies reviewed.   ROS: See HPI.  Otherwise, noncontributory.  nad ncat rrr ctab abd soft, not ttp Ext w/o edema Chaperoned exam with small soft mass appreciated between 3 and 5 o'clock on R breast.  Mass is about 1cm in diameter.  Nipple is sore on palpation but no abnormality felt.  L breast w/o abnormality.  No clavicular LA.

## 2011-05-18 ENCOUNTER — Telehealth: Payer: Self-pay | Admitting: Family Medicine

## 2011-05-18 ENCOUNTER — Ambulatory Visit: Payer: Self-pay | Admitting: Family Medicine

## 2011-05-18 NOTE — Telephone Encounter (Signed)
I need the GB u/s.  Thanks.

## 2011-05-18 NOTE — Telephone Encounter (Signed)
I printed an [place] but it was of the breast.  Is this what you needed or something different?  [place] of breast in your in box.  Please shred if not needed.

## 2011-05-18 NOTE — Telephone Encounter (Signed)
Call pt.  The discharge summary from last hospitalization is in (and didn't list a clear cause of her pain), but I still don't have the u/s results.  Please see about getting the u/s results.  I'll address that when I have a copy of the results.  Thanks.

## 2011-05-19 ENCOUNTER — Encounter: Payer: Self-pay | Admitting: Family Medicine

## 2011-05-19 NOTE — Telephone Encounter (Signed)
In your inbox.

## 2011-05-20 NOTE — Telephone Encounter (Signed)
Normal GB u/s.  Mammogram and u/s are unremarkable.  I don't know the cause of her pain.  It could be chest wall pain.  She's had normal cards eval and unremarkable eval o/w.  I would observe for now, it continued or if she has other sx that would direct further w/u.

## 2011-05-20 NOTE — Telephone Encounter (Signed)
Patient notified as instructed by telephone. 

## 2011-05-28 ENCOUNTER — Ambulatory Visit (INDEPENDENT_AMBULATORY_CARE_PROVIDER_SITE_OTHER): Payer: Medicare Other

## 2011-05-28 DIAGNOSIS — E538 Deficiency of other specified B group vitamins: Secondary | ICD-10-CM

## 2011-05-28 MED ORDER — CYANOCOBALAMIN 1000 MCG/ML IJ SOLN
1000.0000 ug | Freq: Once | INTRAMUSCULAR | Status: AC
  Administered 2011-05-28: 1000 ug via INTRAMUSCULAR

## 2011-06-09 ENCOUNTER — Ambulatory Visit (INDEPENDENT_AMBULATORY_CARE_PROVIDER_SITE_OTHER)
Admission: RE | Admit: 2011-06-09 | Discharge: 2011-06-09 | Disposition: A | Discharge status: Home / Self Care | Payer: Medicare Other | Source: Ambulatory Visit | Attending: Family Medicine | Admitting: Family Medicine

## 2011-06-09 ENCOUNTER — Encounter: Payer: Self-pay | Admitting: Family Medicine

## 2011-06-09 ENCOUNTER — Ambulatory Visit (INDEPENDENT_AMBULATORY_CARE_PROVIDER_SITE_OTHER): Payer: Medicare Other | Admitting: Family Medicine

## 2011-06-09 VITALS — BP 160/78 | HR 84 | Temp 97.8°F | Wt 138.0 lb

## 2011-06-09 DIAGNOSIS — S99911A Unspecified injury of right ankle, initial encounter: Secondary | ICD-10-CM | POA: Insufficient documentation

## 2011-06-09 DIAGNOSIS — S8990XA Unspecified injury of unspecified lower leg, initial encounter: Secondary | ICD-10-CM

## 2011-06-09 DIAGNOSIS — S99929A Unspecified injury of unspecified foot, initial encounter: Secondary | ICD-10-CM

## 2011-06-09 NOTE — Progress Notes (Signed)
SUBJECTIVE: Mckenzie Mclaughlin is a 71 y.o. female who complains of inversion injury to the right ankle 6 day(s) ago. Immediate symptoms: immediate pain, delayed swelling, was able to bear weight directly after injury. Symptoms have been chronic since that time. Prior history of related problems: no prior problems with this area in the past. There is pain and swelling at the lateral aspect of that ankle.   Patient Active Problem List  Diagnoses  . LIPOMA  . B12 DEFICIENCY  . NICOTINE ADDICTION  . HYPERTENSION  . CORONARY ARTERY DISEASE  . PREMATURE VENTRICULAR CONTRACTIONS, FREQUENT  . COPD  . GERD  . CONSTIPATION  . MENOPAUSAL SYNDROME  . LOW BACK PAIN, ACUTE  . HEADACHE  . Muscle spasm  . Vertigo  . HLD (hyperlipidemia)  . History of mammogram  . Osteoporosis screening  . Breast mass  . Atypical chest pain  . Right ankle injury   Past Medical History  Diagnosis Date  . COPD, severe 07/29/98  . Coronary artery disease 01/22/99    Non Q-wave MI, stent RCA  . GERD (gastroesophageal reflux disease) 08/29/98    severe  . Hypertension     03/30/00  . History of ETT 08/03/09    Myoview Normal  . History of ETT 05/1999    Cardiolite pos ETT, neg Cardiolite  . Hx of cardiac catheterization 08/29/01    60% RCA 0/w 20-30% lesions  . Hx of cardiac catheterization 01/22/99    w/Stent 90% RCA lesion  . History of ETT 09/02/01    Normal  . History of ETT 04/28/2006    Myoview normal EF 83%  . History of CT scan of head 08/02/09    w/o mild age appropr atrophy  . History of blurry vision 05/06-05/10/2009    Hospital [facility] CP R/O'D Blurry vision, smoking  . History of ETT 04/10/11    normal EF and no ischemia per Dr. Darrold Junker   Past Surgical History  Procedure Date  . Laminectomy 1976    Disc Removal X 2 Lumbar  . Mastectomy 03/1976    Bilateral due FCBD with implants [name])  . Esophageal dilation 06/00    EGD  . Oophorectomy   . Abdominal hysterectomy    History    Substance Use Topics  . Smoking status: Current Everyday Smoker -- 0.4 packs/day for 50 years    Types: Cigarettes    Last Attempt to Quit: 03/30/2001  . Smokeless tobacco: Never Used   Comment: 1/4 PPD as of 2012  . Alcohol Use: No   Family History  Problem Relation Age of Onset  . Stroke Mother   . Heart disease Mother     MI  . Cancer Father     Lung  . COPD Brother   . Cancer Brother     Lung with mets 2011  . Heart disease Brother     CAD  . Heart disease Sister     cirrhosis due heart disease  . Cirrhosis Sister   . Colon cancer Neg Hx    Allergies  Allergen Reactions  . Sertraline Hcl     REACTION: trembling   Current Outpatient Prescriptions on File Prior to Visit  Medication Sig Dispense Refill  . albuterol (PROAIR HFA) 108 (90 BASE) MCG/ACT inhaler Inhale 2 puffs four times a day as needed       . ALPRAZolam (XANAX) 0.5 MG tablet Take 1 tablet (0.5 mg total) by mouth 2 (two) times daily.  60 tablet  5  . aspirin 325 MG buffered tablet Take 325 mg by mouth daily.        . cyanocobalamin (,VITAMIN B-12,) 1000 MCG/ML injection Inject 1,000 mcg into the muscle every 30 (thirty) days.        Marland Kitchen docusate sodium (COLACE) 100 MG capsule Take 1 capsule (100 mg total) by mouth daily as needed for constipation.      . Fluticasone-Salmeterol (ADVAIR DISKUS) 100-50 MCG/DOSE AEPB Inhale 1 puff into the lungs 2 (two) times daily.        . meclizine (ANTIVERT) 12.5 MG tablet 1/2 to 1 tab by mouth three times a day as needed.  30 tablet  1  . metoprolol succinate (TOPROL-XL) 25 MG 24 hr tablet Take one tablet by mouth every day.  90 tablet  3  . nitroGLYCERIN (NITROSTAT) 0.4 MG SL tablet Place 1 tablet (0.4 mg total) under the tongue every 5 (five) minutes as needed.  25 tablet  5  . NON FORMULARY Oxygen at night and on exercise.       . simvastatin (ZOCOR) 10 MG tablet Take 1 tablet (10 mg total) by mouth at bedtime.  90 tablet  3  . tiotropium (SPIRIVA HANDIHALER) 18 MCG  inhalation capsule Place 18 mcg into inhaler and inhale daily.         The PMH, PSH, Social History, Family History, Medications, and allergies have been reviewed in [facility], and have been updated if relevant.  OBJECTIVE: BP 160/78  Pulse 84  Temp(Src) 97.8 F (36.6 C) (Oral)  Wt 138 lb (62.596 kg)  She appears well, vital signs are normal. There is swelling and tenderness over the lateral malleolus. No tenderness over the medial aspect of the ankle. The fifth metatarsal is not tender. The ankle joint is intact without excessive opening on stressing. X-ray: ordered, but results not yet available The rest of the foot, ankle and leg exam is normal.  ASSESSMENT: Ankle  sprain  PLAN: rest the injured area as much as practical See orders for this visit as documented in the electronic medical record.

## 2011-06-09 NOTE — Patient Instructions (Addendum)
You most likely have an ankle sprain.  I will call you with your xray results. Ice the area for 15 minutes at a time, 3-4 times a day Take Tylenol and or aleve 1-2 tabs twice a day with food as needed. Elevate above the level of your heart when possible Consider physical therapy for strengthening and balance exercises if symptoms do not improve.

## 2011-06-18 ENCOUNTER — Other Ambulatory Visit: Payer: Self-pay | Admitting: *Deleted

## 2011-06-18 MED ORDER — ALPRAZOLAM 0.5 MG PO TABS: 0.5000 mg | ORAL_TABLET | Freq: Two times a day (BID) | ORAL | Status: DC

## 2011-06-18 NOTE — Telephone Encounter (Signed)
Please call in

## 2011-06-18 NOTE — Telephone Encounter (Signed)
Received faxed refill request from pharmacy for Xanax. Is it okay to refill medication?

## 2011-06-18 NOTE — Telephone Encounter (Signed)
Medication phoned to [facility] [address] pharmacy as instructed.  

## 2011-06-25 ENCOUNTER — Ambulatory Visit (INDEPENDENT_AMBULATORY_CARE_PROVIDER_SITE_OTHER): Payer: Medicare Other | Admitting: *Deleted

## 2011-06-25 DIAGNOSIS — E538 Deficiency of other specified B group vitamins: Secondary | ICD-10-CM

## 2011-06-25 MED ORDER — CYANOCOBALAMIN 1000 MCG/ML IJ SOLN
1000.0000 ug | Freq: Once | INTRAMUSCULAR | Status: AC
  Administered 2011-06-25: 1000 ug via INTRAMUSCULAR

## 2011-07-07 ENCOUNTER — Telehealth: Payer: Self-pay

## 2011-07-07 ENCOUNTER — Encounter: Payer: Self-pay | Admitting: *Deleted

## 2011-07-07 NOTE — Telephone Encounter (Signed)
Jola Babinski with [place] [facility] has pt in program for diabetes management. Jola Babinski wants to know if pt has ever been on ace inhibitor or ARB and if not does Dr Para March think pt would benefit from either. Jola Babinski can be reached at [phone] ext [phone]. I called pt and she said she is not participating in any program with [facility].

## 2011-07-08 NOTE — Telephone Encounter (Signed)
Jola Babinski with [employer] as instructed by telephone.

## 2011-07-08 NOTE — Telephone Encounter (Signed)
Not on ACE.  H/o Non Q-wave MI, stent RCA and treated by cardiology with low dose beta blocker.  ACE/ARB not added prev due to likely risk of hypotension.

## 2011-07-08 NOTE — Telephone Encounter (Signed)
Please return call with the info below, would not start ACE/ARB now.

## 2011-07-24 ENCOUNTER — Ambulatory Visit (INDEPENDENT_AMBULATORY_CARE_PROVIDER_SITE_OTHER): Payer: Medicare Other

## 2011-07-24 DIAGNOSIS — E538 Deficiency of other specified B group vitamins: Secondary | ICD-10-CM

## 2011-07-24 MED ORDER — CYANOCOBALAMIN 1000 MCG/ML IJ SOLN
1000.0000 ug | Freq: Once | INTRAMUSCULAR | Status: AC
  Administered 2011-07-24: 1000 ug via INTRAMUSCULAR

## 2011-08-25 ENCOUNTER — Ambulatory Visit (INDEPENDENT_AMBULATORY_CARE_PROVIDER_SITE_OTHER): Payer: Medicare Other | Admitting: *Deleted

## 2011-08-25 DIAGNOSIS — E538 Deficiency of other specified B group vitamins: Secondary | ICD-10-CM

## 2011-08-25 MED ORDER — CYANOCOBALAMIN 1000 MCG/ML IJ SOLN
1000.0000 ug | Freq: Once | INTRAMUSCULAR | Status: AC
  Administered 2011-08-25: 1000 ug via INTRAMUSCULAR

## 2011-09-04 ENCOUNTER — Telehealth: Payer: Self-pay | Admitting: Family Medicine

## 2011-09-04 NOTE — Telephone Encounter (Signed)
Agree with 911, if can't stand, then she needs to call 911.

## 2011-09-04 NOTE — Telephone Encounter (Signed)
Caller: Rafia/Patient; PCP: Crawford Givens Clelia Croft); CB#: [phone]; ; ; Call regarding Dizziness, Nausea THE PATIENT REFUSED 911;  Has dizziness since 6-7. States room is "soinning and cannot get out of bed".  Cannot stand.  "Room is spinning" . Advised 911.

## 2011-09-25 ENCOUNTER — Ambulatory Visit (INDEPENDENT_AMBULATORY_CARE_PROVIDER_SITE_OTHER): Payer: Medicare Other | Admitting: *Deleted

## 2011-09-25 DIAGNOSIS — E538 Deficiency of other specified B group vitamins: Secondary | ICD-10-CM

## 2011-09-25 MED ORDER — CYANOCOBALAMIN 1000 MCG/ML IJ SOLN
1000.0000 ug | Freq: Once | INTRAMUSCULAR | Status: AC
  Administered 2011-09-25: 1000 ug via INTRAMUSCULAR

## 2011-09-29 IMAGING — CR DG LUMBAR SPINE 2-3V
1 series · 3 of 3 positions shown · non-contrast
Comparison: none

REASON FOR EXAM: back pain
COMMENTS:

[Series 1: view not recorded · 0.17mm/px · 3 of 3 slices shown]
[im 1/3]
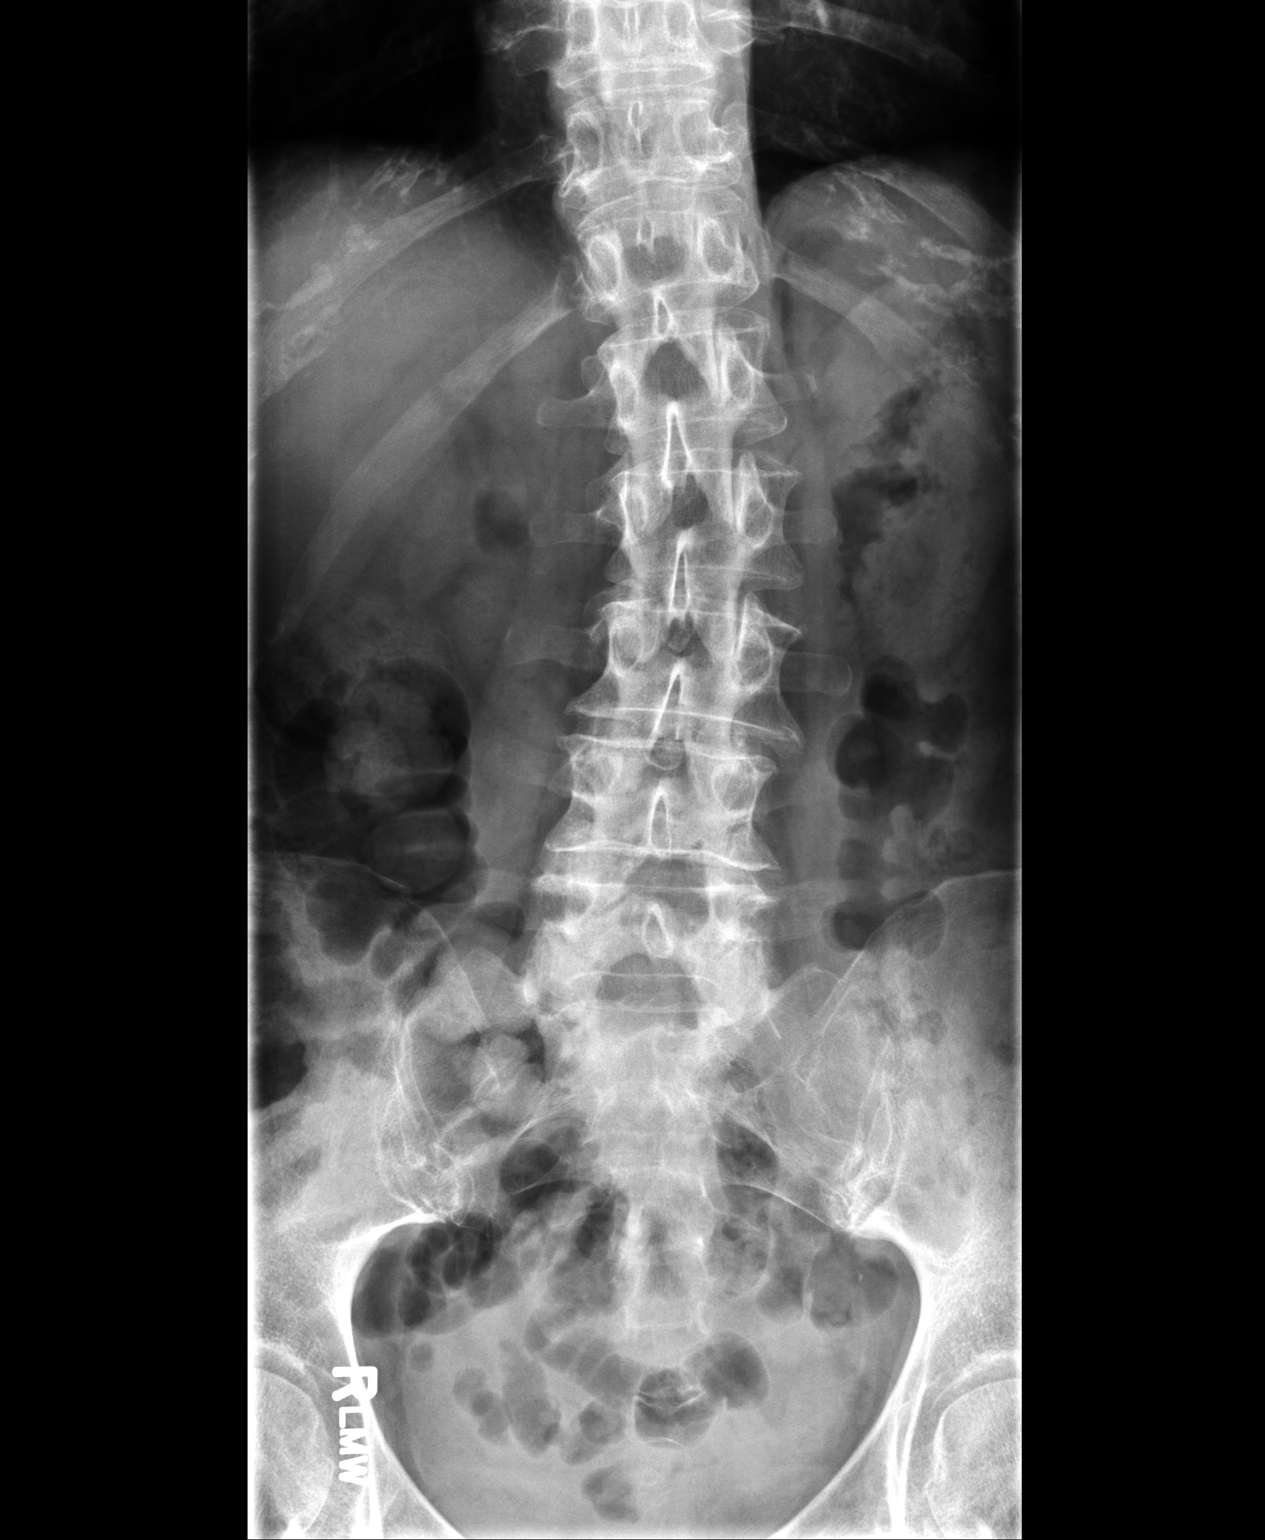
[im 2/3]
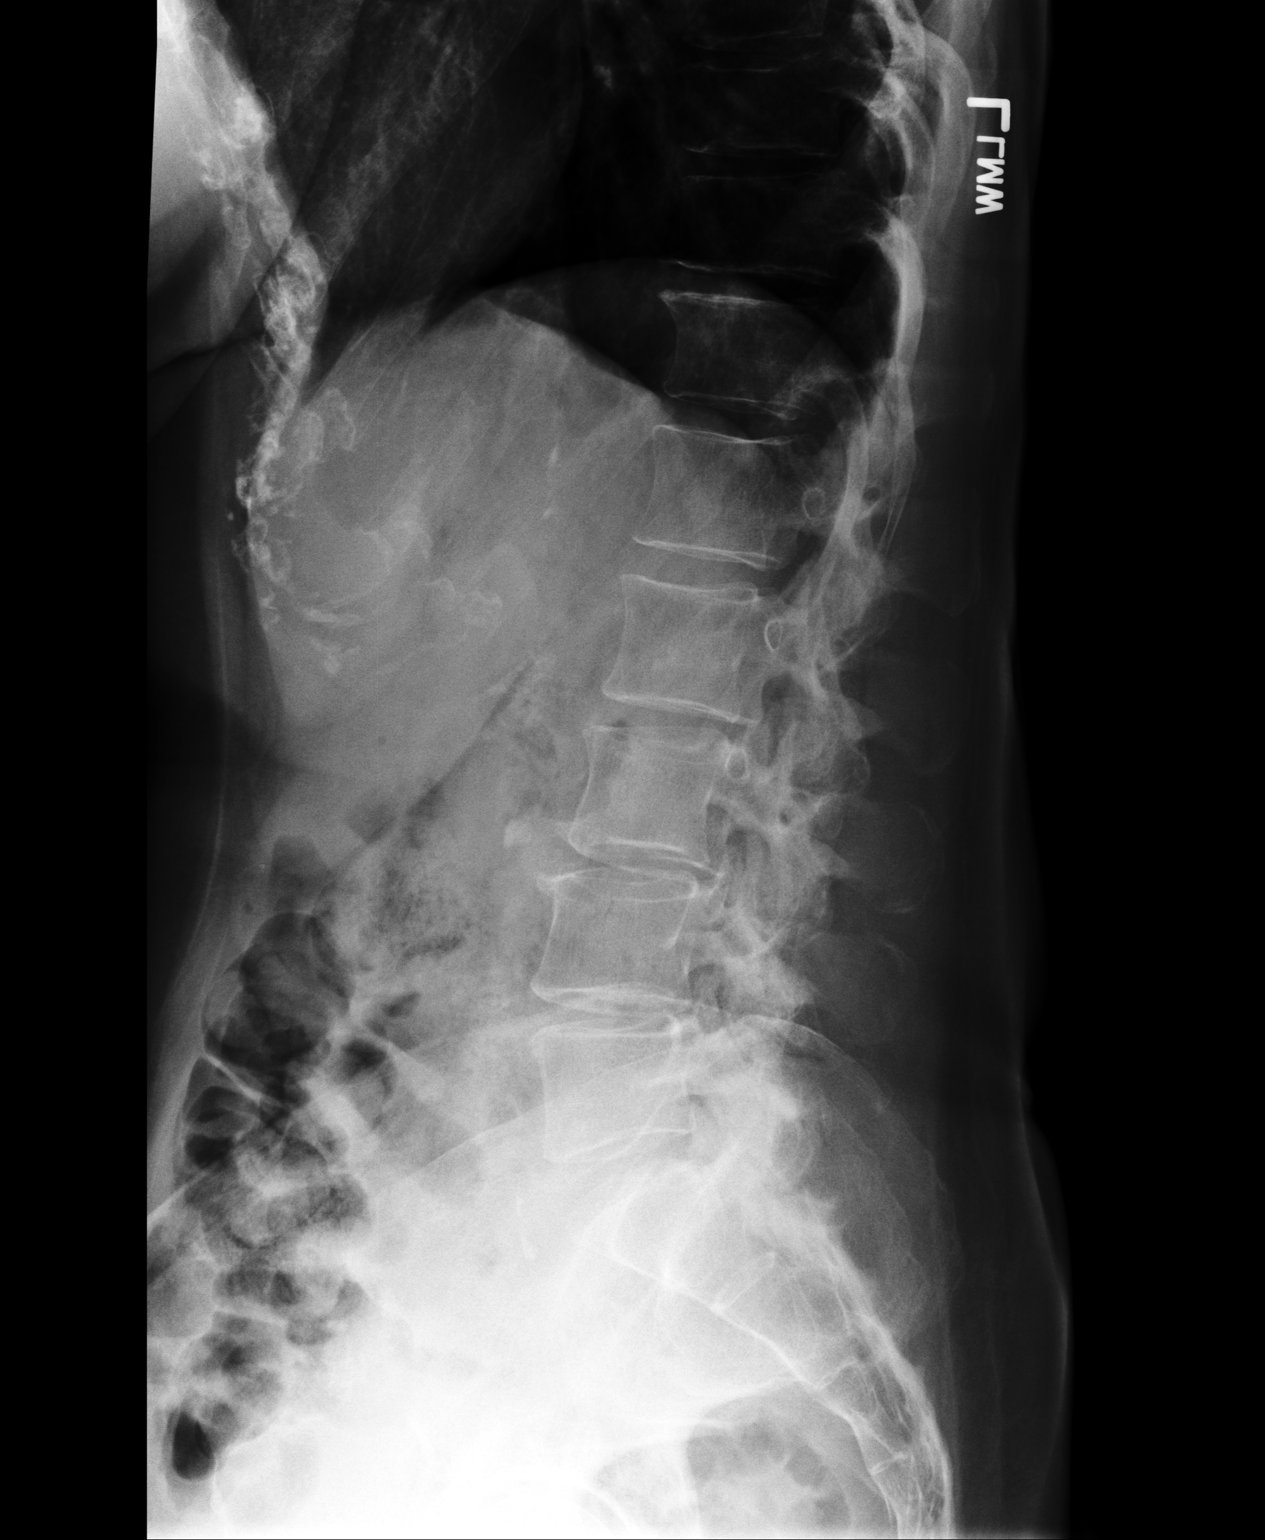
[im 3/3]
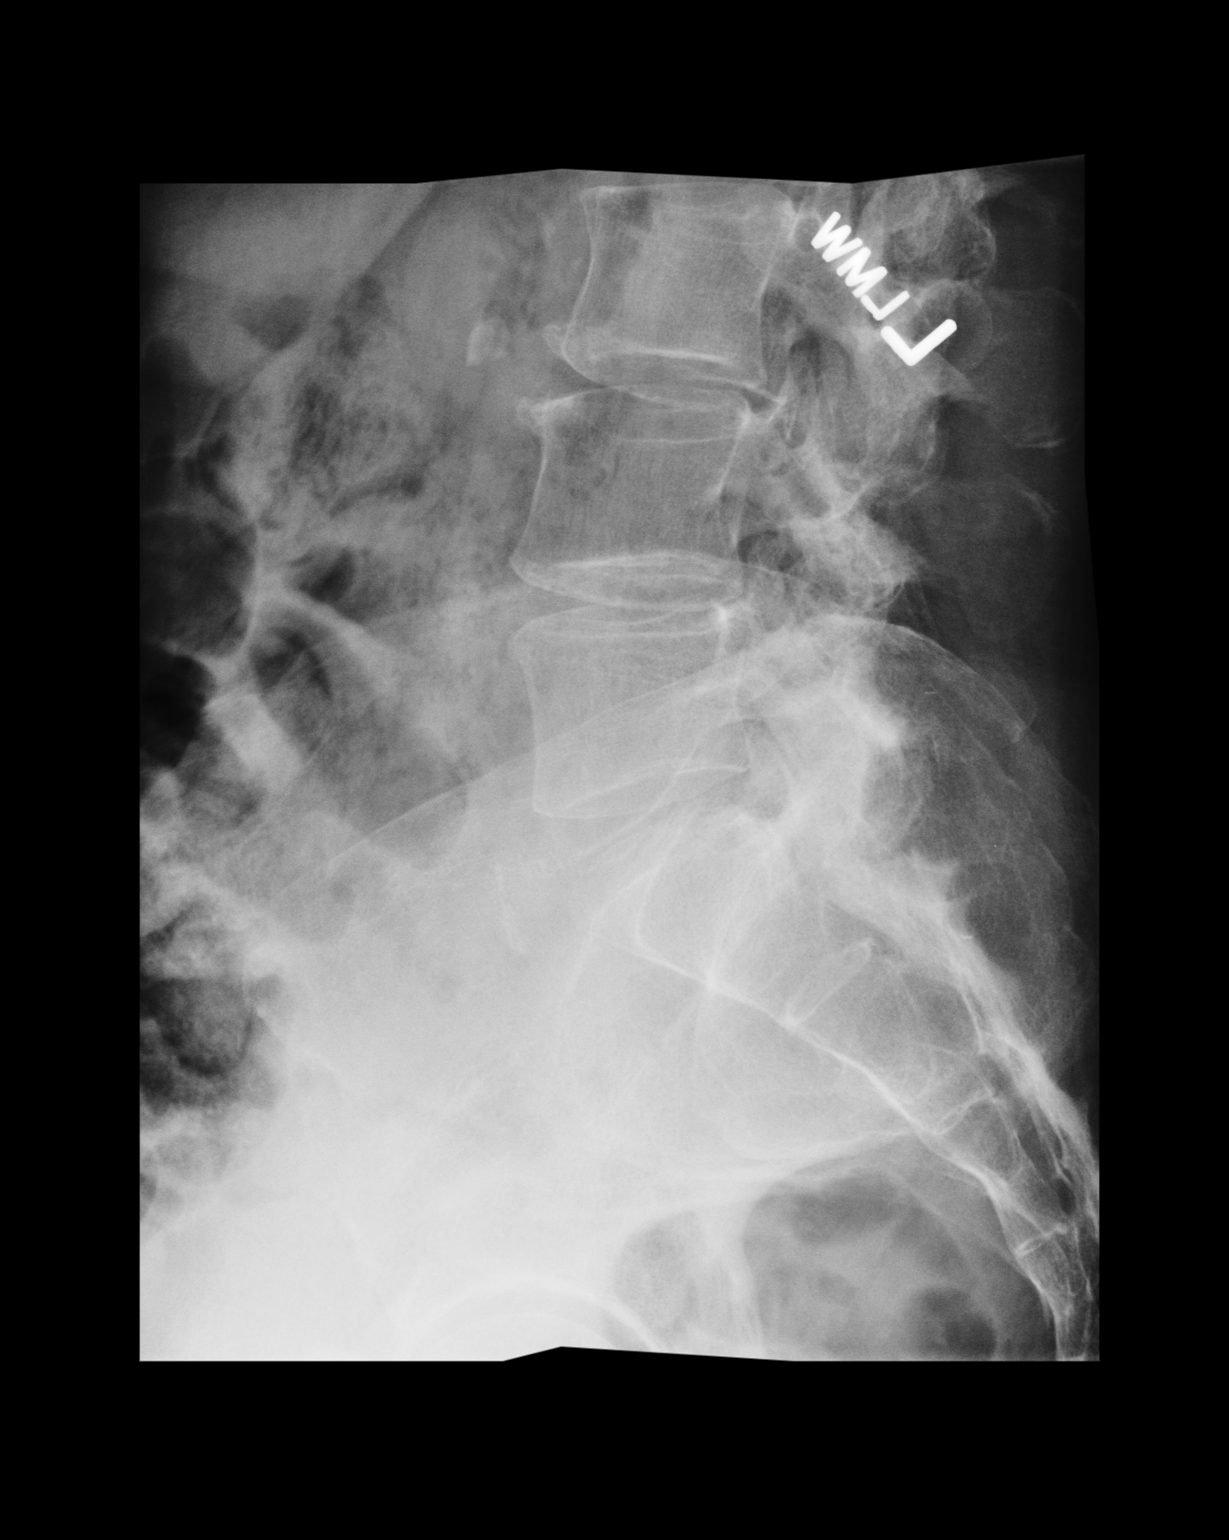

[3 of 3 positions shown; findings below may reference images not displayed]

PROCEDURE:     DXR - DXR LUMBAR SPINE AP AND LATERAL  - June 18, 2010  [DATE]

RESULT:     AP and lateral views of the lumbar spine reveal the bones to be
osteopenic. There is no evidence of compression fracture. Mild degenerative
facet joint changes are noted at L4-L5 and L5-S1. There is curvature of the
lumbar spine with the convexity toward the left. The pedicles and transverse
processes appear intact.
IMPRESSION: There is mild scoliosis with mild degenerative change of
the lumbar spine. There is no evidence of a compression fracture. If the
patient is experiencing radicular symptoms, a followup MRI may be useful.

## 2011-10-26 ENCOUNTER — Ambulatory Visit (INDEPENDENT_AMBULATORY_CARE_PROVIDER_SITE_OTHER): Payer: Medicare Other | Admitting: Family Medicine

## 2011-10-26 ENCOUNTER — Encounter: Payer: Self-pay | Admitting: Family Medicine

## 2011-10-26 ENCOUNTER — Ambulatory Visit: Payer: Medicare Other

## 2011-10-26 VITALS — BP 140/80 | HR 92 | Temp 97.8°F | Wt 138.8 lb

## 2011-10-26 DIAGNOSIS — E538 Deficiency of other specified B group vitamins: Secondary | ICD-10-CM

## 2011-10-26 DIAGNOSIS — R209 Unspecified disturbances of skin sensation: Secondary | ICD-10-CM

## 2011-10-26 DIAGNOSIS — H6120 Impacted cerumen, unspecified ear: Secondary | ICD-10-CM

## 2011-10-26 DIAGNOSIS — H918X9 Other specified hearing loss, unspecified ear: Secondary | ICD-10-CM

## 2011-10-26 DIAGNOSIS — R202 Paresthesia of skin: Secondary | ICD-10-CM

## 2011-10-26 LAB — FOLATE: Folate: 8.4 ng/mL (ref >5.9)

## 2011-10-26 MED ORDER — CYANOCOBALAMIN 1000 MCG/ML IJ SOLN
1000.0000 ug | Freq: Once | INTRAMUSCULAR | Status: AC
  Administered 2011-10-26: 1000 ug via INTRAMUSCULAR

## 2011-10-26 NOTE — Progress Notes (Signed)
Dec in sensation on L great toe, noted by RN on visit throught insurance company.  Present for a year at least.  No pain.  H/o B12 def.  No acute change.  No h/o DM2.  Nonfasting today. No other numbness.  No motor deficit.   B cerumen impaction likely.  Dec in hearing.  No pain. No fevers.  Meds, vitals, and allergies reviewed.   ROS: See HPI.  Otherwise, noncontributory.  nad ncat B cerumen impaction resolved with irrigation and tolerated well. Recheck TMs w/o erythema.  Hearing improved.  L foot with normal inspection.  Intact DP pulse.  Temperature sensation intact but dec in monofilament and vibration sensation in distal stocking distribution on the L foot only.  No skin changes.  No motor deficit.  Normal sensation on R foot and hands.

## 2011-10-26 NOTE — Patient Instructions (Addendum)
Go to the lab on the way out.  We'll contact you with your lab report. If the numbness gets worse, then notify us.  Take care.

## 2011-10-27 ENCOUNTER — Other Ambulatory Visit: Payer: Self-pay | Admitting: Family Medicine

## 2011-10-27 ENCOUNTER — Telehealth: Payer: Self-pay | Admitting: *Deleted

## 2011-10-27 DIAGNOSIS — E538 Deficiency of other specified B group vitamins: Secondary | ICD-10-CM

## 2011-10-27 DIAGNOSIS — H6120 Impacted cerumen, unspecified ear: Secondary | ICD-10-CM | POA: Insufficient documentation

## 2011-10-27 DIAGNOSIS — R202 Paresthesia of skin: Secondary | ICD-10-CM | POA: Insufficient documentation

## 2011-10-27 MED ORDER — VITAMIN B-12 500 MCG PO TABS: 500.0000 ug | ORAL_TABLET | Freq: Every day | ORAL | Status: DC

## 2011-10-27 NOTE — Telephone Encounter (Signed)
Mckenzie Mclaughlin called back after I spoke with her this morning concerning her B-12 level being high and her being instructed to go to the oral B-12 at 500 mcg daily.  She asks if this level could have been high because she had just received her B-12 injection?

## 2011-10-27 NOTE — Assessment & Plan Note (Signed)
Could be due to B12, check folate, glucose (nonfasting), copper and tsh.  If no obvious cause found on labs, then follow clinically.  She agrees.

## 2011-10-27 NOTE — Assessment & Plan Note (Signed)
Resolved with irrigation. 

## 2011-10-27 NOTE — Telephone Encounter (Signed)
It is likely that the B12 level was high regardless of the timing of the injection and I would continue the prev plan.  Thanks.

## 2011-10-28 NOTE — Telephone Encounter (Signed)
Patient notified as instructed by telephone. 

## 2011-12-16 ENCOUNTER — Ambulatory Visit (INDEPENDENT_AMBULATORY_CARE_PROVIDER_SITE_OTHER): Payer: Medicare Other

## 2011-12-16 DIAGNOSIS — Z23 Encounter for immunization: Secondary | ICD-10-CM

## 2011-12-17 ENCOUNTER — Other Ambulatory Visit: Payer: Self-pay | Admitting: *Deleted

## 2011-12-17 NOTE — Telephone Encounter (Signed)
Faxed refill request.  Last filled 11/20/11

## 2011-12-18 MED ORDER — ALPRAZOLAM 0.5 MG PO TABS: 0.5000 mg | ORAL_TABLET | Freq: Two times a day (BID) | ORAL | Status: DC

## 2011-12-18 NOTE — Telephone Encounter (Signed)
Pt called requested when could pick up alprazolam. [facility] said be ready in 30 mins. Pt notified while on phone.

## 2011-12-18 NOTE — Telephone Encounter (Signed)
Medication phoned to pharmacy.  

## 2011-12-18 NOTE — Telephone Encounter (Signed)
Please call in

## 2012-02-15 ENCOUNTER — Other Ambulatory Visit: Payer: Self-pay | Admitting: *Deleted

## 2012-02-15 MED ORDER — METOPROLOL SUCCINATE ER 25 MG PO TB24: ORAL_TABLET | ORAL | Status: DC

## 2012-03-31 ENCOUNTER — Ambulatory Visit (INDEPENDENT_AMBULATORY_CARE_PROVIDER_SITE_OTHER): Payer: Medicare Other | Admitting: Family Medicine

## 2012-03-31 ENCOUNTER — Other Ambulatory Visit (INDEPENDENT_AMBULATORY_CARE_PROVIDER_SITE_OTHER): Payer: Medicare Other

## 2012-03-31 ENCOUNTER — Encounter: Payer: Self-pay | Admitting: Family Medicine

## 2012-03-31 VITALS — BP 144/84 | HR 84 | Temp 98.1°F | Wt 137.0 lb

## 2012-03-31 DIAGNOSIS — R600 Localized edema: Secondary | ICD-10-CM

## 2012-03-31 DIAGNOSIS — R609 Edema, unspecified: Secondary | ICD-10-CM

## 2012-03-31 DIAGNOSIS — E538 Deficiency of other specified B group vitamins: Secondary | ICD-10-CM

## 2012-03-31 DIAGNOSIS — J449 Chronic obstructive pulmonary disease, unspecified: Secondary | ICD-10-CM

## 2012-03-31 MED ORDER — CYANOCOBALAMIN 1000 MCG/ML IJ SOLN: INTRAMUSCULAR | Status: DC

## 2012-03-31 MED ORDER — CYANOCOBALAMIN 1000 MCG/ML IJ SOLN
1000.0000 ug | Freq: Once | INTRAMUSCULAR | Status: AC
  Administered 2012-03-31: 1000 ug via INTRAMUSCULAR

## 2012-03-31 MED ORDER — BENZONATATE 200 MG PO CAPS: 200.0000 mg | ORAL_CAPSULE | Freq: Three times a day (TID) | ORAL | Status: AC | PRN

## 2012-03-31 NOTE — Patient Instructions (Addendum)
Take the tessalon for the cough.  If you don't improve, then notify the pulmonary clinic.  Restart B12 injections every 2 months.  Recheck B12 level in 6 months.   Take care.  Let me know if the swelling gets worse.

## 2012-03-31 NOTE — Progress Notes (Signed)
Cough for 2 months.  Some days it is better than others, some worse than others.  Some sputum, intermittently productive.  Can range from clear to partially tan colored.  Nonbloody.  No fevers.  Her breathing "is off some" with slight inc in SABA use recently.  More wheeze recently.  She has no chest pain.   Had surgery on SCC on L foot 10/13.  Since then the ankle had been swollen, more at the end of the day.  It wasn't clear that the surgery was causative.  No edema on the R ankle.  Swelling started about 3 weeks after the surgery.    She was off B12 replacement as she didn't tolerate the oral replacement.  B12 level has dropped in the interval.  We discussed plan.   Meds, vitals, and allergies reviewed.   ROS: See HPI.  Otherwise, noncontributory.  nad ncat Mmm rrr No focal dec in BS, no wheeze, no inc in wob Healing scar along the lateral L 5th MT.  She has normal DP pulse and sensation.   Calf B 31cm w/o bands or cords, not ttp No edema at all in the ankle except for minimal puffiness that doesn't pit just superior and posterior to the L medial malleolus.  No edema o/w.

## 2012-04-01 ENCOUNTER — Other Ambulatory Visit: Payer: Self-pay | Admitting: *Deleted

## 2012-04-01 DIAGNOSIS — R600 Localized edema: Secondary | ICD-10-CM | POA: Insufficient documentation

## 2012-04-01 MED ORDER — SIMVASTATIN 10 MG PO TABS: 10.0000 mg | ORAL_TABLET | Freq: Every day | ORAL | Status: DC

## 2012-04-01 NOTE — Assessment & Plan Note (Signed)
Minimal, resolves by the start of the day.  I am not concerned for DVT. She wasn't immobile after surgery as she was using a walker.  This could a be a local phenomena after surgery, but it isn't alarming.  I would follow clinically.  She agrees.

## 2012-04-01 NOTE — Assessment & Plan Note (Signed)
I offered a trial of doxy, she declined.  Would like to treat cough symptomatically.  This is okay to try, will use tessalon and f/u with pulm as scheduled.

## 2012-04-01 NOTE — Assessment & Plan Note (Signed)
Will change to injection q 2 months.  She agrees.

## 2012-04-10 ENCOUNTER — Observation Stay: Payer: Self-pay | Admitting: Internal Medicine

## 2012-04-10 LAB — COMPREHENSIVE METABOLIC PANEL
Albumin: 3.6 g/dL (ref 3.4–5.0)
Alkaline Phosphatase: 84 U/L (ref 50–136)
Anion Gap: 4 — ABNORMAL LOW (ref 7–16)
BUN: 11 mg/dL (ref 7–18)
Bilirubin,Total: 0.3 mg/dL (ref 0.2–1.0)
Calcium, Total: 9.3 mg/dL (ref 8.5–10.1)
Chloride: 104 mmol/L (ref 98–107)
Co2: 31 mmol/L (ref 21–32)
Creatinine: 0.62 mg/dL (ref 0.60–1.30)
EGFR (African American): >60
Osmolality: 277 (ref 275–301)
SGOT(AST): 31 U/L (ref 15–37)
Sodium: 139 mmol/L (ref 136–145)

## 2012-04-10 LAB — CBC
HCT: 44.5 % (ref 35.0–47.0)
HGB: 15.1 g/dL (ref 12.0–16.0)
MCHC: 33.9 g/dL (ref 32.0–36.0)
Platelet: 200 10*3/uL (ref 150–440)
RBC: 4.86 10*6/uL (ref 3.80–5.20)

## 2012-04-10 LAB — CK TOTAL AND CKMB (NOT AT ARMC): CK, Total: 126 U/L (ref 21–215)

## 2012-04-11 LAB — CBC WITH DIFFERENTIAL/PLATELET
Basophil #: 0.1 10*3/uL (ref 0.0–0.1)
Eosinophil #: 0.2 10*3/uL (ref 0.0–0.7)
HCT: 42.6 % (ref 35.0–47.0)
HGB: 13.7 g/dL (ref 12.0–16.0)
Lymphocyte #: 1.9 10*3/uL (ref 1.0–3.6)
Lymphocyte %: 31.1 %
MCH: 29.3 pg (ref 26.0–34.0)
MCHC: 32.2 g/dL (ref 32.0–36.0)
Monocyte #: 0.4 x10 3/mm (ref 0.2–0.9)
Neutrophil %: 58.2 %
Platelet: 183 10*3/uL (ref 150–440)
RDW: 12.8 % (ref 11.5–14.5)
WBC: 6.3 10*3/uL (ref 3.6–11.0)

## 2012-04-11 LAB — BASIC METABOLIC PANEL
Anion Gap: 5 — ABNORMAL LOW (ref 7–16)
BUN: 9 mg/dL (ref 7–18)
Calcium, Total: 9.5 mg/dL (ref 8.5–10.1)
Chloride: 107 mmol/L (ref 98–107)
Creatinine: 0.63 mg/dL (ref 0.60–1.30)
EGFR (African American): >60
EGFR (Non-African Amer.): >60
Osmolality: 281 (ref 275–301)
Potassium: 4 mmol/L (ref 3.5–5.1)

## 2012-04-11 LAB — TROPONIN I: Troponin-I: <0.02 ng/mL

## 2012-04-11 LAB — CK TOTAL AND CKMB (NOT AT ARMC)
CK, Total: 102 U/L (ref 21–215)
CK, Total: 116 U/L (ref 21–215)
CK-MB: 3.3 ng/mL (ref 0.5–3.6)
CK-MB: 2.7 ng/mL (ref 0.5–3.6)

## 2012-04-12 ENCOUNTER — Telehealth: Payer: Self-pay | Admitting: Family Medicine

## 2012-04-12 NOTE — Telephone Encounter (Signed)
Noted, thanks!

## 2012-04-12 NOTE — Telephone Encounter (Signed)
[facility] called to schedule a 2-4 week follow up appointment from the hospital for patient.  I scheduled patient to come in on 04/27/12.  [facility] will fax discharge summary.  They mentioned that in the discharge summary they'll be asking for you to refer patient to Pulmonary.

## 2012-04-15 ENCOUNTER — Ambulatory Visit: Payer: Self-pay | Admitting: Radiation Oncology

## 2012-04-15 ENCOUNTER — Ambulatory Visit: Payer: Self-pay | Admitting: Internal Medicine

## 2012-04-15 LAB — PROTIME-INR
INR: 0.9
Prothrombin Time: 12.6 secs (ref 11.5–14.7)

## 2012-04-15 LAB — APTT: Activated PTT: 32.7 s

## 2012-04-17 ENCOUNTER — Encounter: Payer: Self-pay | Admitting: Family Medicine

## 2012-04-17 DIAGNOSIS — R911 Solitary pulmonary nodule: Secondary | ICD-10-CM | POA: Insufficient documentation

## 2012-04-18 ENCOUNTER — Other Ambulatory Visit (HOSPITAL_COMMUNITY): Payer: Self-pay | Admitting: Cardiothoracic Surgery

## 2012-04-18 DIAGNOSIS — R918 Other nonspecific abnormal finding of lung field: Secondary | ICD-10-CM

## 2012-04-22 ENCOUNTER — Other Ambulatory Visit (HOSPITAL_COMMUNITY): Payer: Self-pay | Admitting: Radiation Oncology

## 2012-04-22 DIAGNOSIS — R918 Other nonspecific abnormal finding of lung field: Secondary | ICD-10-CM

## 2012-04-26 ENCOUNTER — Encounter (HOSPITAL_COMMUNITY): Payer: Self-pay | Admitting: Pharmacy Technician

## 2012-04-27 ENCOUNTER — Other Ambulatory Visit: Payer: Self-pay | Admitting: Radiology

## 2012-04-27 ENCOUNTER — Ambulatory Visit: Payer: Medicare Other | Admitting: Family Medicine

## 2012-04-29 ENCOUNTER — Ambulatory Visit (HOSPITAL_COMMUNITY)
Admission: RE | Admit: 2012-04-29 | Discharge: 2012-04-29 | Disposition: A | Discharge status: Home / Self Care | Payer: Medicare Other | Source: Ambulatory Visit | Attending: Cardiothoracic Surgery | Admitting: Cardiothoracic Surgery

## 2012-04-29 ENCOUNTER — Encounter (HOSPITAL_COMMUNITY): Payer: Self-pay

## 2012-04-29 ENCOUNTER — Ambulatory Visit (HOSPITAL_COMMUNITY)
Admission: RE | Admit: 2012-04-29 | Discharge: 2012-04-29 | Disposition: A | Discharge status: Home / Self Care | Payer: Medicare Other | Source: Ambulatory Visit | Attending: Interventional Radiology | Admitting: Interventional Radiology

## 2012-04-29 ENCOUNTER — Ambulatory Visit (HOSPITAL_COMMUNITY)
Admission: RE | Admit: 2012-04-29 | Discharge: 2012-04-29 | Disposition: A | Discharge status: Home / Self Care | Payer: Medicare Other | Source: Ambulatory Visit | Attending: Radiology | Admitting: Radiology

## 2012-04-29 VITALS — BP 161/77 | HR 87 | Temp 97.5°F | Resp 20 | Ht 67.0 in | Wt 137.0 lb

## 2012-04-29 DIAGNOSIS — J9383 Other pneumothorax: Secondary | ICD-10-CM | POA: Insufficient documentation

## 2012-04-29 DIAGNOSIS — R918 Other nonspecific abnormal finding of lung field: Secondary | ICD-10-CM

## 2012-04-29 HISTORY — DX: Chronic obstructive pulmonary disease, unspecified: J44.9

## 2012-04-29 LAB — CBC
HCT: 46.5 % — ABNORMAL HIGH (ref 36.0–46.0)
Hemoglobin: 15.7 g/dL — ABNORMAL HIGH (ref 12.0–15.0)
MCHC: 33.8 g/dL (ref 30.0–36.0)
RBC: 5.1 MIL/uL (ref 3.87–5.11)
WBC: 6.4 10*3/uL (ref 4.0–10.5)

## 2012-04-29 LAB — PROTIME-INR
INR: 0.93 (ref 0.00–1.49)
Prothrombin Time: 12.4 seconds (ref 11.6–15.2)

## 2012-04-29 MED ORDER — MIDAZOLAM HCL 2 MG/2ML IJ SOLN
INTRAMUSCULAR | Status: AC | PRN
  Administered 2012-04-29 (×2): 1 mg via INTRAVENOUS

## 2012-04-29 MED ORDER — FENTANYL CITRATE 0.05 MG/ML IJ SOLN
INTRAMUSCULAR | Status: AC
  Filled 2012-04-29: qty 4

## 2012-04-29 MED ORDER — ONDANSETRON HCL 4 MG/2ML IJ SOLN
INTRAMUSCULAR | Status: AC
  Filled 2012-04-29: qty 2

## 2012-04-29 MED ORDER — SODIUM CHLORIDE 0.9 % IV SOLN
INTRAVENOUS | Status: DC
  Administered 2012-04-29: 08:00:00 via INTRAVENOUS

## 2012-04-29 MED ORDER — MIDAZOLAM HCL 2 MG/2ML IJ SOLN
INTRAMUSCULAR | Status: DC
Start: 2012-04-29 — End: 2012-04-30
  Filled 2012-04-29: qty 4

## 2012-04-29 MED ORDER — ONDANSETRON HCL 4 MG/2ML IJ SOLN
4.0000 mg | Freq: Once | INTRAMUSCULAR | Status: AC
  Administered 2012-04-29: 4 mg via INTRAVENOUS
  Filled 2012-04-29: qty 2

## 2012-04-29 MED ORDER — FENTANYL CITRATE 0.05 MG/ML IJ SOLN
INTRAMUSCULAR | Status: AC | PRN
  Administered 2012-04-29 (×2): 50 ug via INTRAVENOUS

## 2012-04-29 NOTE — H&P (Signed)
Chief Complaint: "I'm here for a lung biopsy" Referring Physician:[facility] HPI: [place] is an 72 y.o. female with newly found RUL lung mass on Chest CT and PET. She has COPD and had some SOB that led to the workup. She uses O2 occasionally at home and has some DOE with activity, but takes her inhalers regularly and denies SOB at rest. She feels fine this am. Denies use of blood thinners. PMHx and meds reviewed as below.  Past Medical History:  Past Medical History  Diagnosis Date  . COPD, severe 07/29/98  . Coronary artery disease 01/22/99    Non Q-wave MI, stent RCA  . GERD (gastroesophageal reflux disease) 08/29/98    severe  . Hypertension     03/30/00  . History of ETT 08/03/09    Myoview Normal  . History of ETT 05/1999    Cardiolite pos ETT, neg Cardiolite  . Hx of cardiac catheterization 08/29/01    60% RCA 0/w 20-30% lesions  . Hx of cardiac catheterization 01/22/99    w/Stent 90% RCA lesion  . History of ETT 09/02/01    Normal  . History of ETT 04/28/2006    Myoview normal EF 83%  . History of CT scan of head 08/02/09    w/o mild age appropr atrophy  . History of blurry vision 05/06-05/10/2009    Hospital [facility] CP R/O'D Blurry vision, smoking  . History of ETT 04/10/11    normal EF and no ischemia per Dr. Darrold Junker  . COPD (chronic obstructive pulmonary disease)     Past Surgical History:  Past Surgical History  Procedure Date  . Laminectomy 1976    Disc Removal X 2 Lumbar  . Mastectomy 03/1976    Bilateral due FCBD with implants [facility])  . Esophageal dilation 06/00    EGD  . Oophorectomy   . Abdominal hysterectomy     Family History:  Family History  Problem Relation Age of Onset  . Stroke Mother   . Heart disease Mother     MI  . Cancer Father     Lung  . COPD Brother   . Cancer Brother     Lung with mets 2011  . Heart disease Brother     CAD  . Heart disease Sister     cirrhosis due heart disease  . Cirrhosis Sister   . Colon cancer  Neg Hx     Social History:  reports that she quit smoking about 2 weeks ago. Her smoking use included Cigarettes. She has a 20 pack-year smoking history. She has never used smokeless tobacco. She reports that she does not drink alcohol or use illicit drugs.  Allergies:  Allergies  Allergen Reactions  . Amoxicillin Other (See Comments)    Shortness of breath.  . Sertraline Hcl     REACTION: trembling    Medications: albuterol (PROAIR HFA) 108 (90 BASE) MCG/ACT inhaler (Taking) Sig - Route: Inhale 2 puffs into the lungs 4 (four) times daily as needed. For shortness of breath. - Inhalation Class: Historical Med Number of times this order has been changed since signing: 5 [address] ALPRAZolam (XANAX) 0.5 MG tablet (Taking) 12/17/2011 Sig - Route: Take 0.5 mg by mouth 2 (two) times daily. - Oral Class: Historical Med aspirin 325 MG tablet (Taking) Sig - Route: Take 325 mg by mouth every morning. - Oral Class: Historical Med Number of times this order has been changed since signing: 1 Order [address] cyanocobalamin (,VITAMIN B-12,) 1000 MCG/ML  injection (Taking) 03/31/2012 Sig - Route: Inject 1,000 mcg into the muscle. She receives the 1st of each month at her doctor's office. - Intramuscular Class: Historical Med Number of times this order has been changed since signing: 1 Order [address] Fluticasone-Salmeterol (ADVAIR DISKUS) 100-50 MCG/DOSE AEPB (Taking) Sig - Route: Inhale 1 puff into the lungs 2 (two) times daily. - Inhalation Class: Historical Med Number of times this order has been changed since signing: 3 Order [address] metoprolol succinate (TOPROL-XL) 25 MG 24 hr tablet (Taking) 02/15/2012 Sig - Route: Take 25 mg by mouth every morning. - Oral Class: Historical Med Number of times this order has been changed since signing: 1 Order [address] nitroGLYCERIN (NITROSTAT) 0.4 MG SL tablet (Taking) 02/24/2011 Sig - Route: Place 0.4 mg under the tongue every 5 (five) minutes as needed. For  chest pain. - Sublingual Class: Historical Med simvastatin (ZOCOR) 10 MG tablet (Taking) 04/01/2012 Sig - Route: Take 10 mg by mouth at bedtime. - Oral Class: Historical Med tiotropium (SPIRIVA HANDIHALER) 18 MCG inhalation capsule (Taking) Sig - Route: Place 18 mcg into inhaler and inhale at bedt   Please HPI for pertinent positives, otherwise complete 10 system ROS negative.  Physical Exam: Blood pressure 148/70, pulse 77, temperature 97.6 F (36.4 C), temperature source Oral, resp. rate 16, height 5\' 7"  (1.702 m), weight 137 lb (62.143 kg), SpO2 100.00%. Body mass index is 21.46 kg/(m^2).   General Appearance:  Alert, cooperative, no distress, appears stated age  Head:  Normocephalic, without obvious abnormality, atraumatic  ENT: Unremarkable  Neck: Supple, symmetrical, trachea midline, no adenopathy, thyroid: not enlarged, symmetric, no tenderness/mass/nodules  Lungs:   Clear to auscultation bilaterally, no w/r/r, respirations unlabored without use of accessory muscles.  Chest Wall:  No tenderness or deformity  Heart:  Regular rate and rhythm, S1, S2 normal, no murmur, rub or gallop. Carotids 2+ without bruit.  Neurologic: Normal affect, no gross deficits.   Results for orders placed during the hospital encounter of 04/29/12 (from the past 48 hour(s))  APTT     Status: Normal   Collection Time   04/29/12  7:35 AM      Component Value Range Comment   aPTT 32  24 - 37 seconds   CBC     Status: Abnormal   Collection Time   04/29/12  7:35 AM      Component Value Range Comment   WBC 6.4  4.0 - 10.5 K/uL    RBC 5.10  3.87 - 5.11 MIL/uL    Hemoglobin 15.7 (*) 12.0 - 15.0 g/dL    HCT 16.1 (*) 09.6 - 46.0 %    MCV 91.2  78.0 - 100.0 fL    MCH 30.8  26.0 - 34.0 pg    MCHC 33.8  30.0 - 36.0 g/dL    RDW 04.5  40.9 - 81.1 %    Platelets 200  150 - 400 K/uL   PROTIME-INR     Status: Normal   Collection Time   04/29/12  7:35 AM      Component Value Range Comment   Prothrombin Time 12.4   11.6 - 15.2 seconds    INR 0.93  0.00 - 1.49    No results found.  Assessment/Plan RUL lung mass Discussed CT guided biopsy, including risks and complications such as bleeding, hemoptysis, PTX, need for chest tube, need for admission. Labs reviewed, ok. Consent signed in chart  Brayton El PA-C 04/29/2012, 8:14 AM

## 2012-04-29 NOTE — Procedures (Signed)
Procedure:  CT guided biopsy of RUL lung nodule Findings:  17 G needle advanced to posterior margin of 1.3 cm RUL nodule.  18 G core bx samples x 3 Trace pleural air after procedure.

## 2012-04-29 NOTE — H&P (Signed)
For biopsy of RUL lung nodule today.

## 2012-04-29 NOTE — Progress Notes (Signed)
Post Lung Biopsy Recovery in Short Stay Pt sitting up na dbed with a "pained" expression on face right  chest pain 2/10 states" the nausea is a little better but my heart seems to be fluttering"  Connected to monitor at bedside and pt having an occasional PAC with a rate of 80's . Stood at bedside and states her pain is worse at least a 3-4. Assisted pt back to bed Patient is warm and dry but states she feels shakey and wants to take her Xanax. O2 stat 88-90. Placed pt in bed and started O2 at 2 liters and asked pt to not take her home medication at this time.

## 2012-04-30 ENCOUNTER — Ambulatory Visit: Payer: Self-pay | Admitting: Internal Medicine

## 2012-04-30 DIAGNOSIS — J939 Pneumothorax, unspecified: Secondary | ICD-10-CM

## 2012-04-30 HISTORY — DX: Pneumothorax, unspecified: J93.9

## 2012-05-01 ENCOUNTER — Other Ambulatory Visit: Payer: Self-pay | Admitting: Family Medicine

## 2012-05-01 ENCOUNTER — Inpatient Hospital Stay: Payer: Self-pay | Admitting: Surgery

## 2012-05-01 LAB — COMPREHENSIVE METABOLIC PANEL
Alkaline Phosphatase: 93 U/L (ref 50–136)
Anion Gap: 7 (ref 7–16)
Bilirubin,Total: 0.5 mg/dL (ref 0.2–1.0)
Calcium, Total: 9.2 mg/dL (ref 8.5–10.1)
Creatinine: 0.61 mg/dL (ref 0.60–1.30)
EGFR (African American): >60
Glucose: 95 mg/dL (ref 65–99)
Osmolality: 280 (ref 275–301)
SGOT(AST): 44 U/L — ABNORMAL HIGH (ref 15–37)
SGPT (ALT): 30 U/L (ref 12–78)
Total Protein: 7.6 g/dL (ref 6.4–8.2)

## 2012-05-01 LAB — CBC
HCT: 44.4 % (ref 35.0–47.0)
MCH: 30.9 pg (ref 26.0–34.0)
MCHC: 33.6 g/dL (ref 32.0–36.0)
MCV: 92 fL (ref 80–100)
Platelet: 184 10*3/uL (ref 150–440)
RBC: 4.84 10*6/uL (ref 3.80–5.20)
RDW: 13 % (ref 11.5–14.5)
WBC: 10.6 10*3/uL (ref 3.6–11.0)

## 2012-05-01 LAB — TROPONIN I: Troponin-I: <0.02 ng/mL

## 2012-05-01 NOTE — Progress Notes (Signed)
Opened in error

## 2012-05-02 LAB — BASIC METABOLIC PANEL
Anion Gap: 4 — ABNORMAL LOW (ref 7–16)
BUN: 11 mg/dL (ref 7–18)
Co2: 32 mmol/L (ref 21–32)
Creatinine: 0.65 mg/dL (ref 0.60–1.30)
EGFR (African American): >60
EGFR (Non-African Amer.): >60
Glucose: 109 mg/dL — ABNORMAL HIGH (ref 65–99)
Potassium: 4.6 mmol/L (ref 3.5–5.1)
Sodium: 139 mmol/L (ref 136–145)

## 2012-05-02 LAB — CBC WITH DIFFERENTIAL/PLATELET
Basophil %: 0.4 %
Eosinophil #: 0.1 10*3/uL (ref 0.0–0.7)
Eosinophil %: 0.6 %
HCT: 39.3 % (ref 35.0–47.0)
HGB: 12.9 g/dL (ref 12.0–16.0)
Lymphocyte #: 1.3 10*3/uL (ref 1.0–3.6)
Lymphocyte %: 12.9 %
MCH: 30.2 pg (ref 26.0–34.0)
MCV: 92 fL (ref 80–100)
Monocyte #: 1.1 x10 3/mm — ABNORMAL HIGH (ref 0.2–0.9)
Monocyte %: 10.7 %
Neutrophil %: 75.4 %
Platelet: 153 10*3/uL (ref 150–440)
RDW: 12.6 % (ref 11.5–14.5)

## 2012-05-03 LAB — URINALYSIS, COMPLETE
Hyaline Cast: 3
Leukocyte Esterase: NEGATIVE
Protein: 25
RBC,UR: 11 /HPF (ref 0–5)
Specific Gravity: 1.035 (ref 1.003–1.030)
Squamous Epithelial: 1

## 2012-05-04 LAB — PROTIME-INR
INR: 1.2
Prothrombin Time: 15.9 secs — ABNORMAL HIGH (ref 11.5–14.7)

## 2012-05-05 ENCOUNTER — Telehealth: Payer: Self-pay | Admitting: *Deleted

## 2012-05-05 ENCOUNTER — Encounter: Payer: Self-pay | Admitting: *Deleted

## 2012-05-05 LAB — URINE CULTURE

## 2012-05-05 NOTE — Telephone Encounter (Signed)
Message copied by Annamarie Major on Thu May 05, 2012 10:51 AM ------      Message from: Annamarie Major      Created: Mon May 02, 2012  8:41 AM       [facility] to reschedule appt.  Has recently had CT and CXR according to previous appts.      ----- Message -----         From: Joaquim Nam, MD         Sent: 05/01/2012   1:32 PM           To: Annamarie Major, CMA            Please make sure she is rescheduled.             Clelia Croft            ----- Message -----         From: Joaquim Nam, MD         Sent: 04/27/2012           To: Joaquim Nam, MD            [facility] called to schedule a 2-4 week follow up appointment from the hospital for patient.  I scheduled patient to come in on 04/27/12.  [facility] will fax discharge summary.  They mentioned that in the discharge summary they'll be asking for you to refer patient to Pulmonary.

## 2012-05-06 LAB — BASIC METABOLIC PANEL
Calcium, Total: 8.7 mg/dL (ref 8.5–10.1)
Chloride: 101 mmol/L (ref 98–107)
Creatinine: 1.04 mg/dL (ref 0.60–1.30)
EGFR (African American): >60
EGFR (Non-African Amer.): 55 — ABNORMAL LOW
Glucose: 154 mg/dL — ABNORMAL HIGH (ref 65–99)
Osmolality: 280 (ref 275–301)
Potassium: 4.5 mmol/L (ref 3.5–5.1)
Sodium: 134 mmol/L — ABNORMAL LOW (ref 136–145)

## 2012-05-06 LAB — CBC WITH DIFFERENTIAL/PLATELET
Eosinophil #: 0 10*3/uL (ref 0.0–0.7)
Lymphocyte %: 2.9 %
MCH: 30.4 pg (ref 26.0–34.0)
MCHC: 33.6 g/dL (ref 32.0–36.0)
MCV: 90 fL (ref 80–100)
Neutrophil #: 12 10*3/uL — ABNORMAL HIGH (ref 1.4–6.5)
Platelet: 208 10*3/uL (ref 150–440)
RBC: 4.47 10*6/uL (ref 3.80–5.20)
RDW: 13 % (ref 11.5–14.5)
WBC: 12.7 10*3/uL — ABNORMAL HIGH (ref 3.6–11.0)

## 2012-05-13 LAB — CBC WITH DIFFERENTIAL/PLATELET
Basophil #: 0.1 10*3/uL (ref 0.0–0.1)
Basophil %: 0.6 %
Eosinophil #: 0.3 10*3/uL (ref 0.0–0.7)
HCT: 40.9 % (ref 35.0–47.0)
HGB: 13 g/dL (ref 12.0–16.0)
Lymphocyte #: 0.9 10*3/uL — ABNORMAL LOW (ref 1.0–3.6)
Neutrophil #: 7.9 10*3/uL — ABNORMAL HIGH (ref 1.4–6.5)
Neutrophil %: 81.6 %
Platelet: 330 10*3/uL (ref 150–440)
RBC: 4.42 10*6/uL (ref 3.80–5.20)
RDW: 13.2 % (ref 11.5–14.5)

## 2012-05-13 LAB — BASIC METABOLIC PANEL
Anion Gap: 4 — ABNORMAL LOW (ref 7–16)
Calcium, Total: 9 mg/dL (ref 8.5–10.1)
Chloride: 102 mmol/L (ref 98–107)
Glucose: 103 mg/dL — ABNORMAL HIGH (ref 65–99)
Osmolality: 276 (ref 275–301)
Potassium: 4 mmol/L (ref 3.5–5.1)
Sodium: 139 mmol/L (ref 136–145)

## 2012-05-18 ENCOUNTER — Telehealth: Payer: Self-pay | Admitting: Family Medicine

## 2012-05-18 NOTE — Telephone Encounter (Signed)
I called [facility] and asked staff to pass my support along to the patient. I wish her well in her recovery.

## 2012-05-23 ENCOUNTER — Ambulatory Visit: Payer: Self-pay | Admitting: Radiation Oncology

## 2012-05-27 ENCOUNTER — Inpatient Hospital Stay: Payer: Self-pay

## 2012-05-28 ENCOUNTER — Ambulatory Visit: Payer: Self-pay | Admitting: Radiation Oncology

## 2012-05-29 LAB — PRO B NATRIURETIC PEPTIDE: B-Type Natriuretic Peptide: 540 pg/mL — ABNORMAL HIGH (ref 0–125)

## 2012-05-31 ENCOUNTER — Ambulatory Visit: Payer: Medicare Other

## 2012-05-31 LAB — PROTIME-INR
INR: 1
Prothrombin Time: 13.5 secs (ref 11.5–14.7)

## 2012-05-31 LAB — PLATELET COUNT: Platelet: 209 10*3/uL (ref 150–440)

## 2012-06-02 ENCOUNTER — Encounter: Payer: Self-pay | Admitting: Cardiothoracic Surgery

## 2012-06-02 ENCOUNTER — Inpatient Hospital Stay (HOSPITAL_COMMUNITY)
Admission: AD | Admit: 2012-06-02 | Discharge: 2012-06-09 | DRG: 200 | Disposition: A | Discharge status: Home Health | Payer: Medicare Other | Source: Other Acute Inpatient Hospital | Attending: Cardiothoracic Surgery | Admitting: Cardiothoracic Surgery

## 2012-06-02 ENCOUNTER — Inpatient Hospital Stay (HOSPITAL_COMMUNITY): Payer: Medicare Other

## 2012-06-02 ENCOUNTER — Encounter (HOSPITAL_COMMUNITY): Payer: Self-pay | Admitting: General Practice

## 2012-06-02 DIAGNOSIS — E46 Unspecified protein-calorie malnutrition: Secondary | ICD-10-CM | POA: Diagnosis present

## 2012-06-02 DIAGNOSIS — R5381 Other malaise: Secondary | ICD-10-CM | POA: Diagnosis present

## 2012-06-02 DIAGNOSIS — N390 Urinary tract infection, site not specified: Secondary | ICD-10-CM | POA: Diagnosis present

## 2012-06-02 DIAGNOSIS — Y849 Medical procedure, unspecified as the cause of abnormal reaction of the patient, or of later complication, without mention of misadventure at the time of the procedure: Secondary | ICD-10-CM | POA: Diagnosis present

## 2012-06-02 DIAGNOSIS — J449 Chronic obstructive pulmonary disease, unspecified: Secondary | ICD-10-CM | POA: Diagnosis present

## 2012-06-02 DIAGNOSIS — F411 Generalized anxiety disorder: Secondary | ICD-10-CM | POA: Diagnosis present

## 2012-06-02 DIAGNOSIS — J95812 Postprocedural air leak: Principal | ICD-10-CM | POA: Diagnosis present

## 2012-06-02 DIAGNOSIS — I2510 Atherosclerotic heart disease of native coronary artery without angina pectoris: Secondary | ICD-10-CM | POA: Diagnosis present

## 2012-06-02 DIAGNOSIS — J4489 Other specified chronic obstructive pulmonary disease: Secondary | ICD-10-CM | POA: Diagnosis present

## 2012-06-02 DIAGNOSIS — Z7982 Long term (current) use of aspirin: Secondary | ICD-10-CM

## 2012-06-02 DIAGNOSIS — E785 Hyperlipidemia, unspecified: Secondary | ICD-10-CM | POA: Diagnosis present

## 2012-06-02 DIAGNOSIS — K219 Gastro-esophageal reflux disease without esophagitis: Secondary | ICD-10-CM | POA: Diagnosis present

## 2012-06-02 DIAGNOSIS — I10 Essential (primary) hypertension: Secondary | ICD-10-CM | POA: Diagnosis present

## 2012-06-02 DIAGNOSIS — B965 Pseudomonas (aeruginosa) (mallei) (pseudomallei) as the cause of diseases classified elsewhere: Secondary | ICD-10-CM | POA: Diagnosis present

## 2012-06-02 DIAGNOSIS — Z87891 Personal history of nicotine dependence: Secondary | ICD-10-CM

## 2012-06-02 DIAGNOSIS — Z9861 Coronary angioplasty status: Secondary | ICD-10-CM

## 2012-06-02 DIAGNOSIS — Z79899 Other long term (current) drug therapy: Secondary | ICD-10-CM

## 2012-06-02 DIAGNOSIS — I252 Old myocardial infarction: Secondary | ICD-10-CM

## 2012-06-02 HISTORY — DX: Anxiety disorder, unspecified: F41.9

## 2012-06-02 HISTORY — DX: Shortness of breath: R06.02

## 2012-06-02 LAB — COMPREHENSIVE METABOLIC PANEL
ALT: 13 U/L (ref 0–35)
AST: 22 U/L (ref 0–37)
CO2: 33 mEq/L — ABNORMAL HIGH (ref 19–32)
Chloride: 101 mEq/L (ref 96–112)
GFR calc non Af Amer: 88 mL/min — ABNORMAL LOW (ref >90)
Sodium: 140 mEq/L (ref 135–145)
Total Bilirubin: 0.2 mg/dL — ABNORMAL LOW (ref 0.3–1.2)

## 2012-06-02 LAB — URINALYSIS, ROUTINE W REFLEX MICROSCOPIC
Bilirubin Urine: NEGATIVE
Nitrite: NEGATIVE
Specific Gravity, Urine: 1.019 (ref 1.005–1.030)
pH: 6.5 (ref 5.0–8.0)

## 2012-06-02 LAB — CBC
Platelets: 209 10*3/uL (ref 150–400)
RBC: 3.7 MIL/uL — ABNORMAL LOW (ref 3.87–5.11)
WBC: 4.6 10*3/uL (ref 4.0–10.5)

## 2012-06-02 LAB — APTT: aPTT: 33 seconds (ref 24–37)

## 2012-06-02 LAB — URINE MICROSCOPIC-ADD ON

## 2012-06-02 MED ORDER — ALBUTEROL SULFATE HFA 108 (90 BASE) MCG/ACT IN AERS
2.0000 | INHALATION_SPRAY | Freq: Four times a day (QID) | RESPIRATORY_TRACT | Status: DC
  Filled 2012-06-02: qty 6.7

## 2012-06-02 MED ORDER — TIOTROPIUM BROMIDE MONOHYDRATE 18 MCG IN CAPS
18.0000 ug | ORAL_CAPSULE | Freq: Every day | RESPIRATORY_TRACT | Status: DC
  Filled 2012-06-02: qty 5

## 2012-06-02 MED ORDER — ACETAMINOPHEN 325 MG PO TABS: 650.0000 mg | ORAL_TABLET | Freq: Four times a day (QID) | ORAL | Status: DC | PRN

## 2012-06-02 MED ORDER — ASPIRIN EC 325 MG PO TBEC
325.0000 mg | DELAYED_RELEASE_TABLET | Freq: Every day | ORAL | Status: DC
  Administered 2012-06-02 – 2012-06-09 (×8): 325 mg via ORAL
  Filled 2012-06-02 (×8): qty 1

## 2012-06-02 MED ORDER — SODIUM CHLORIDE 0.9 % IJ SOLN
3.0000 mL | Freq: Two times a day (BID) | INTRAMUSCULAR | Status: DC
  Administered 2012-06-03 – 2012-06-08 (×2): 3 mL via INTRAVENOUS

## 2012-06-02 MED ORDER — ASPIRIN 325 MG PO TABS
325.0000 mg | ORAL_TABLET | Freq: Every morning | ORAL | Status: DC
  Filled 2012-06-02: qty 1

## 2012-06-02 MED ORDER — ONDANSETRON HCL 4 MG/2ML IJ SOLN: 4.0000 mg | Freq: Four times a day (QID) | INTRAMUSCULAR | Status: DC | PRN

## 2012-06-02 MED ORDER — MAGIC MOUTHWASH
5.0000 mL | Freq: Three times a day (TID) | ORAL | Status: DC | PRN
  Filled 2012-06-02: qty 5

## 2012-06-02 MED ORDER — TIOTROPIUM BROMIDE MONOHYDRATE 18 MCG IN CAPS: 18.0000 ug | ORAL_CAPSULE | Freq: Every day | RESPIRATORY_TRACT | Status: DC

## 2012-06-02 MED ORDER — SODIUM CHLORIDE 0.9 % IV SOLN: 250.0000 mL | INTRAVENOUS | Status: DC | PRN

## 2012-06-02 MED ORDER — ALPRAZOLAM 0.5 MG PO TABS
0.5000 mg | ORAL_TABLET | Freq: Two times a day (BID) | ORAL | Status: DC
  Administered 2012-06-02 – 2012-06-03 (×3): 0.5 mg via ORAL
  Filled 2012-06-02 (×3): qty 1

## 2012-06-02 MED ORDER — METOPROLOL SUCCINATE ER 25 MG PO TB24
25.0000 mg | ORAL_TABLET | Freq: Every morning | ORAL | Status: DC
  Administered 2012-06-02 – 2012-06-09 (×8): 25 mg via ORAL
  Filled 2012-06-02 (×8): qty 1

## 2012-06-02 MED ORDER — MOMETASONE FURO-FORMOTEROL FUM 100-5 MCG/ACT IN AERO
2.0000 | INHALATION_SPRAY | Freq: Two times a day (BID) | RESPIRATORY_TRACT | Status: DC
  Administered 2012-06-02 – 2012-06-09 (×13): 2 via RESPIRATORY_TRACT
  Filled 2012-06-02 (×2): qty 8.8

## 2012-06-02 MED ORDER — ENOXAPARIN SODIUM 30 MG/0.3ML ~~LOC~~ SOLN
30.0000 mg | SUBCUTANEOUS | Status: DC
  Administered 2012-06-02: 30 mg via SUBCUTANEOUS
  Filled 2012-06-02 (×2): qty 0.3

## 2012-06-02 MED ORDER — SIMVASTATIN 10 MG PO TABS
10.0000 mg | ORAL_TABLET | Freq: Every day | ORAL | Status: DC
  Administered 2012-06-02 – 2012-06-08 (×7): 10 mg via ORAL
  Filled 2012-06-02 (×9): qty 1

## 2012-06-02 MED ORDER — NITROGLYCERIN 0.4 MG SL SUBL: 0.4000 mg | SUBLINGUAL_TABLET | SUBLINGUAL | Status: DC | PRN

## 2012-06-02 MED ORDER — TIOTROPIUM BROMIDE MONOHYDRATE 18 MCG IN CAPS
18.0000 ug | ORAL_CAPSULE | Freq: Every day | RESPIRATORY_TRACT | Status: DC
  Administered 2012-06-03 – 2012-06-07 (×5): 18 ug via RESPIRATORY_TRACT
  Filled 2012-06-02: qty 5

## 2012-06-02 MED ORDER — ACETAMINOPHEN 650 MG RE SUPP: 650.0000 mg | Freq: Four times a day (QID) | RECTAL | Status: DC | PRN

## 2012-06-02 MED ORDER — DOCUSATE SODIUM 100 MG PO CAPS
100.0000 mg | ORAL_CAPSULE | Freq: Two times a day (BID) | ORAL | Status: DC
  Administered 2012-06-02 – 2012-06-09 (×11): 100 mg via ORAL
  Filled 2012-06-02 (×16): qty 1

## 2012-06-02 MED ORDER — ONDANSETRON HCL 4 MG PO TABS: 4.0000 mg | ORAL_TABLET | Freq: Four times a day (QID) | ORAL | Status: DC | PRN

## 2012-06-02 MED ORDER — ALUM & MAG HYDROXIDE-SIMETH 200-200-20 MG/5ML PO SUSP: 30.0000 mL | Freq: Four times a day (QID) | ORAL | Status: DC | PRN

## 2012-06-02 MED ORDER — OXYCODONE HCL 5 MG PO TABS
5.0000 mg | ORAL_TABLET | ORAL | Status: DC | PRN
  Filled 2012-06-02: qty 1

## 2012-06-02 MED ORDER — ALBUTEROL SULFATE HFA 108 (90 BASE) MCG/ACT IN AERS
2.0000 | INHALATION_SPRAY | RESPIRATORY_TRACT | Status: DC | PRN
  Administered 2012-06-02 – 2012-06-09 (×9): 2 via RESPIRATORY_TRACT
  Filled 2012-06-02: qty 6.7

## 2012-06-02 MED ORDER — SODIUM CHLORIDE 0.9 % IJ SOLN: 3.0000 mL | INTRAMUSCULAR | Status: DC | PRN

## 2012-06-02 MED ORDER — SODIUM CHLORIDE 0.9 % IJ SOLN
3.0000 mL | Freq: Two times a day (BID) | INTRAMUSCULAR | Status: DC
  Administered 2012-06-02 – 2012-06-09 (×14): 3 mL via INTRAVENOUS

## 2012-06-02 MED ORDER — ALBUTEROL SULFATE HFA 108 (90 BASE) MCG/ACT IN AERS
2.0000 | INHALATION_SPRAY | Freq: Four times a day (QID) | RESPIRATORY_TRACT | Status: DC
  Administered 2012-06-02 – 2012-06-05 (×10): 2 via RESPIRATORY_TRACT

## 2012-06-02 MED ORDER — CYANOCOBALAMIN 1000 MCG/ML IJ SOLN: 1000.0000 ug | INTRAMUSCULAR | Status: DC

## 2012-06-02 NOTE — Progress Notes (Addendum)
Placed each chest tube to sahara pleur-evac drains per MD order. Drains were clamped while switching to new drains.  Pt tolerated well. Will continue to monitor. Grimsley, Johnnye Sandford V

## 2012-06-02 NOTE — H&P (Addendum)
[address]       [place] [address]             [phone]    Mckenzie Mclaughlin is an 72 y.o. female.  @bday  BJY:[id number] Chief Complaint: persistant air leak HPI: per Dr Tyrone Sage She is a 72 year old white female who was admitted to [facility] on 04/10/2012 with a complaint of shortness of breath and chest pain. Because of a history of coronary artery disease she was admitted. She does have a history of a coronary angioplasty done to her right coronary artery with stent placement several years ago. She had a chest CT scan done which revealed a new 2 cm right upper lobe mass. She was managed as an inpatient and Dr Thelma Barge saw her in consultation for evaluation of her right upper lobe mass. The right upper lobe mass was new from a prior CT scan and she had a PET scan done which revealed intense uptake in the right upper lobe without evidence of distant disease. However her pulmonary function studies were poor and precluded surgical intervention. She continued to smoke in early January and was on home oxygen therapy. She could not walk more than 5 or 6 steps without becoming short of breath. For all these reasons Dr Thelma Barge did not feel that she was a surgical candidate and she was seen by Dr. Carmina Miller  who recommended that she undergo a CT-guided needle biopsy. On January 30 she underwent a percutaneous CT-guided needle biopsy at [facility] and was discharged the same day. The pathology revealed granulomatous inflammation without evidence of malignancy. She was subsequently admitted to our hospital on 05/01/2012 for a large right-sided pneumothorax (presumably from the biopsy). After insertion of a chest tube her lung was expanded but she continued to have a persistent air leak. Manipulation of the suction and of the tube itself failed to completely control her air leak. She was taken to the operating room on 05/05/2012 bt Dr Thelma Barge where she underwent a thoracoscopy with lysis of  adhesions and talc pleurodesis. At the time of surgery and we could not identify the air leak. She did have extensive underlying COPD but I could not see the source of her air leak. She continued to have an air leak and despite attempts at lowering or increasing the suction, her air leak persisted but she was ultimately able to transition to a Heimlich valve and was discharged to a rehabilitation facility on 05/16/2012 with a standard 28 French chest tube. She was seen in the office approximately one week later where it was noticed that she continued to have an air leak as demonstrated with air movement through the Heimlich valve. Her x-ray at that time showed no evidence of pneumothorax and she was brought into the hospital on February 27 where I clamped her chest tube with the hope that I could remove her tube if she did not develop a pneumothorax. Shortly after clamping her chest tube we obtained an x-ray which revealed a subpulmonic pneumothorax of approximately 2 cm. The patient remained essentially asymptomatic and we left the tube clamped for another 24 hours. However she developed a larger subpulmonic pneumothorax measuring upwards of 7 cm. The chest tube was unclamped and she had a CT scan performed on February 27 which revealed a loculated basal her pneumothorax of approximately 15-20%. The radiologist placed a small percutaneous pigtail catheter with prompt drainage of the pneumothorax. A followup chest x-ray showed the lung to be completely  expanded again. Earlier this week I attempted a doxycycline pleurodesis at her bedside through the small pigtail catheter. She tolerated the doxycycline pleurodesis poorly with significant pain. We were unable to allow the doxycycline in the chest for only a short period of time. This was despite intrapleural lidocaine,     She is transferred to [facility] for further care  Past Medical History  Diagnosis Date  . COPD, severe 07/29/98  . Coronary artery  disease 01/22/99    Non Q-wave MI, stent RCA  . GERD (gastroesophageal reflux disease) 08/29/98    severe  . Hypertension     03/30/00  . History of ETT 08/03/09    Myoview Normal  . History of ETT 05/1999    Cardiolite pos ETT, neg Cardiolite  . Hx of cardiac catheterization 08/29/01    60% RCA 0/w 20-30% lesions  . Hx of cardiac catheterization 01/22/99    w/Stent 90% RCA lesion  . History of ETT 09/02/01    Normal  . History of ETT 04/28/2006    Myoview normal EF 83%  . History of CT scan of head 08/02/09    w/o mild age appropr atrophy  . History of blurry vision 05/06-05/10/2009    Hospital [facility] CP R/O'D Blurry vision, smoking  . History of ETT 04/10/11    normal EF and no ischemia per Dr. Darrold Junker  . COPD (chronic obstructive pulmonary disease)   . Anxiety   . Shortness of breath     Past Surgical History  Procedure Laterality Date  . Laminectomy  1976    Disc Removal X 2 Lumbar  . Mastectomy  03/1976    Bilateral due FCBD with implants [facility])  . Esophageal dilation  06/00    EGD  . Oophorectomy    . Abdominal hysterectomy      Family History  Problem Relation Age of Onset  . Stroke Mother   . Heart disease Mother     MI  . Cancer Father     Lung  . COPD Brother   . Cancer Brother     Lung with mets 2011  . Heart disease Brother     CAD  . Heart disease Sister     cirrhosis due heart disease  . Cirrhosis Sister   . Colon cancer Neg Hx    Social History:  reports that she quit smoking about 7 weeks ago. Her smoking use included Cigarettes. She has a 20 pack-year smoking history. She has never used smokeless tobacco. She reports that she does not drink alcohol or use illicit drugs.  Allergies:  Allergies  Allergen Reactions  . Amoxicillin Other (See Comments)    Shortness of breath.  . Sertraline Hcl     REACTION: trembling    Medications Prior to Admission  Medication Sig Dispense Refill  . albuterol (PROAIR HFA) 108 (90 BASE) MCG/ACT  inhaler Inhale 2 puffs into the lungs 4 (four) times daily as needed. For shortness of breath.      . ALPRAZolam (XANAX) 0.5 MG tablet Take 0.5 mg by mouth 2 (two) times daily.      Marland Kitchen aspirin 325 MG tablet Take 325 mg by mouth every morning.      . cyanocobalamin (,VITAMIN B-12,) 1000 MCG/ML injection Inject 1,000 mcg into the muscle. She receives the 1st of each month at her doctor's office.      . Fluticasone-Salmeterol (ADVAIR DISKUS) 100-50 MCG/DOSE AEPB Inhale 1 puff into the lungs 2 (two) times daily.       Marland Kitchen  metoprolol succinate (TOPROL-XL) 25 MG 24 hr tablet Take 25 mg by mouth every morning.       . nitroGLYCERIN (NITROSTAT) 0.4 MG SL tablet Place 0.4 mg under the tongue every 5 (five) minutes as needed. For chest pain.      . simvastatin (ZOCOR) 10 MG tablet Take 10 mg by mouth at bedtime.      Marland Kitchen tiotropium (SPIRIVA HANDIHALER) 18 MCG inhalation capsule Place 18 mcg into inhaler and inhale at bedtime.         No results found for this or any previous visit (from the past 48 hour(s)). No results found.    Review of Systems  Constitutional: Positive for weight loss, malaise/fatigue and diaphoresis. Negative for fever and chills.  HENT: Positive for nosebleeds. Negative for hearing loss, ear pain, congestion, sore throat, neck pain, tinnitus and ear discharge.   Eyes: Negative for blurred vision, double vision, photophobia, pain, discharge and redness.  Respiratory: Positive for shortness of breath. Negative for cough, hemoptysis, sputum production, wheezing and stridor.   Cardiovascular: Positive for palpitations and orthopnea. Negative for chest pain, claudication, leg swelling and PND.  Gastrointestinal: Positive for nausea. Negative for heartburn, vomiting, abdominal pain, diarrhea, constipation, blood in stool and melena.  Genitourinary: Negative for dysuria, urgency, frequency, hematuria and flank pain.  Musculoskeletal: Positive for myalgias and back pain. Negative for joint  pain and falls.  Skin: Positive for itching. Negative for rash.  Neurological: Positive for weakness. Negative for dizziness, tingling, tremors, sensory change, speech change, focal weakness, seizures, loss of consciousness and headaches.  Endo/Heme/Allergies: Positive for polydipsia. Negative for environmental allergies. Bruises/bleeds easily.  Psychiatric/Behavioral: Positive for depression. Negative for suicidal ideas, hallucinations, memory loss and substance abuse. The patient is nervous/anxious and has insomnia.    Physical Examination: General appearance - chronically ill appearing and NAD Eyes - pupils equal and reactive, extraocular eye movements intact, sclera anicteric Mouth - candida posterior pharynx Neck - supple, no significant adenopathy Lymphatics - no palpable lymphadenopathy, no hepatosplenomegaly Chest - dim throughout Heart - normal rate, regular rhythm, normal S1, S2, no murmurs, rubs, clicks or gallops, irregularly irregular rhythm with rate 100's Abdomen - soft, nontender, nondistended, no masses or organomegaly Rectal - deferred Back exam - grossly normal Neurological - alert, oriented, normal speech, no focal findings or movement disorder noted Musculoskeletal - no joint tenderness, deformity or swelling Extremities - peripheral pulses normal, no pedal edema, no clubbing or cyanosis Skin - normal coloration and turgor, no rashes, no suspicious skin lesions noted Blood pressure 134/61, pulse 129, temperature 98 F (36.7 C), temperature source Oral, resp. rate 18, SpO2 98.00%.    Assessment/Plan Ongoing air leak as described for possible placement of bronchial valves Will place ct tubes to air chambers so were can quantify the degree of air leak Will get ct of chest to evaluate degree of collapse and bronchial anatomy   Delight Ovens MD  Beeper [phone] Office [phone] 06/02/2012 5:45 PM

## 2012-06-03 ENCOUNTER — Inpatient Hospital Stay (HOSPITAL_COMMUNITY): Admission: RE | Admit: 2012-06-03 | Payer: Medicare Other | Source: Ambulatory Visit | Admitting: Cardiothoracic Surgery

## 2012-06-03 ENCOUNTER — Inpatient Hospital Stay (HOSPITAL_COMMUNITY): Payer: Medicare Other

## 2012-06-03 ENCOUNTER — Encounter (HOSPITAL_COMMUNITY)
Admission: AD | Disposition: A | Discharge status: Home Health | Payer: Self-pay | Source: Other Acute Inpatient Hospital | Attending: Cardiothoracic Surgery

## 2012-06-03 SURGERY — BRONCHOSCOPY, FLEXIBLE, WITH INTRABRONCHIAL VALVE INSERTION
Anesthesia: General

## 2012-06-03 MED ORDER — ENOXAPARIN SODIUM 40 MG/0.4ML ~~LOC~~ SOLN
40.0000 mg | SUBCUTANEOUS | Status: DC
  Administered 2012-06-03 – 2012-06-08 (×6): 40 mg via SUBCUTANEOUS
  Filled 2012-06-03 (×7): qty 0.4

## 2012-06-03 MED ORDER — ALPRAZOLAM 0.5 MG PO TABS
0.5000 mg | ORAL_TABLET | Freq: Three times a day (TID) | ORAL | Status: DC
  Administered 2012-06-03 (×2): 0.5 mg via ORAL
  Filled 2012-06-03 (×3): qty 1

## 2012-06-03 MED ORDER — ENSURE PO LIQD
237.0000 mL | Freq: Three times a day (TID) | ORAL | Status: DC
  Administered 2012-06-03 – 2012-06-08 (×14): 237 mL via ORAL
  Filled 2012-06-03 (×24): qty 237

## 2012-06-03 NOTE — Progress Notes (Signed)
06/03/2012 0700  During morning report found pigtail catheter dislodged and on the floor.  Pt was not aware that the tube had come out.  Site covered with vaseline gauze, covered with 4x4 and tape.  VSS BP 176/66 HR 104 97% on 2L O2 via [place].  Dr. Tyrone Sage made aware. No new orders received. Will continue to closely monitor. Axtyn Woehler, Avie Echevaria , RN

## 2012-06-03 NOTE — Progress Notes (Addendum)
                    [address]            [place] [address]          [phone]       Procedure(s) (LRB): VIDEO BRONCHOSCOPY WITH INSERTION OF INTERBRONCHIAL VALVE (IBV) (N/A)  Subjective: Posterior CT fell out overnight, pt unaware. States her breathing is about the same this am. No new complaints.   Objective: Vital signs in last 24 hours: Patient Vitals for the past 24 hrs:  BP Temp Temp src Pulse Resp SpO2 Height Weight  06/03/12 0825 - - - - - 98 % - -  06/03/12 0728 176/66 mmHg 98.5 F (36.9 C) Oral 104 22 97 % - -  06/03/12 0727 - - - - - - - 126 lb 1.7 oz (57.2 kg)  06/03/12 0434 130/55 mmHg 97.6 F (36.4 C) Oral 88 20 99 % - -  06/02/12 1933 129/47 mmHg 98 F (36.7 C) Oral 103 18 98 % - -  06/02/12 1354 117/46 mmHg 97.9 F (36.6 C) Oral 103 18 100 % 5\' 7"  (1.702 m) 133 lb 9.6 oz (60.6 kg)  06/02/12 1041 134/61 mmHg 98 F (36.7 C) Oral 129 18 98 % - -   Current Weight  06/03/12 126 lb 1.7 oz (57.2 kg)     Intake/Output from previous day: 03/06 0701 - 03/07 0700 In: 240 [P.O.:240] Out: 500 [Urine:500]    PHYSICAL EXAM:  Heart: RRR Lungs: Slightly decreased BS on R Chest tube: No air leak with cough, but poor cough effort    Lab Results: CBC: Recent Labs  06/02/12 1428  WBC 4.6  HGB 11.1*  HCT 33.3*  PLT 209   BMET:  Recent Labs  06/02/12 1428  NA 140  K 4.1  CL 101  CO2 33*  GLUCOSE 116*  BUN 11  CREATININE 0.66  CALCIUM 9.8    PT/INR:  Recent Labs  06/02/12 1428  LABPROT 13.5  INR 1.04   CXR (3/6): Findings: Trachea is midline. Heart size normal. Right chest tube  remains in place with tip near the apex. Probable tiny right apical  pneumothorax persists but is smaller than on the 04/29/2012. There  is volume loss at the right lung base. Left lung is clear.  IMPRESSION:  Probable tiny residual right apical pneumothorax with right chest  tube in place. Volume loss at the base of the right  hemithorax    Assessment/Plan: S/P Procedure(s) (LRB): VIDEO BRONCHOSCOPY WITH INSERTION OF INTERBRONCHIAL VALVE (IBV) (N/A) CT without overt air leak this am on water seal.   CT scan performed, but no report in system yet. Protein calorie malnutrition- will add supplements. Keep NPO for now- may need to proceed with endobronchial valve placement later today.   LOS: 1 day    COLLINS,GINA H 06/03/2012

## 2012-06-03 NOTE — Progress Notes (Signed)
PT Cancellation Note  Patient Details Name: Mckenzie Mclaughlin MRN: [id number] DOB: 08/29/1940   Cancelled Treatment:    Reason Eval/Treat Not Completed: Other (comment) (pt is too short of breath this afternoon)  Pt wants to d/c to home, not to rehab, but she doesn't want to have PT until the doctors decide what to do about her chest tube. Will check back on Monday   [name] 06/03/2012, 4:05 PM

## 2012-06-04 ENCOUNTER — Inpatient Hospital Stay (HOSPITAL_COMMUNITY): Payer: Medicare Other

## 2012-06-04 MED ORDER — ALPRAZOLAM 0.5 MG PO TABS
0.5000 mg | ORAL_TABLET | Freq: Three times a day (TID) | ORAL | Status: DC | PRN
  Administered 2012-06-04 – 2012-06-09 (×16): 0.5 mg via ORAL
  Filled 2012-06-04 (×16): qty 1

## 2012-06-04 NOTE — Progress Notes (Addendum)
Procedure(s) (LRB): VIDEO BRONCHOSCOPY WITH INSERTION OF INTERBRONCHIAL VALVE (IBV) (N/A) Subjective:  Ms. Scoles complains of anxiety this morning.  She states she takes Xanax at home usually three times per day, but was told that she could not have any this morning by nursing.   Objective: Vital signs in last 24 hours: Temp:  [97.7 F (36.5 C)-99 F (37.2 C)] 97.7 F (36.5 C) (03/08 0526) Pulse Rate:  [96-111] 96 (03/08 0526) Cardiac Rhythm:  [-]  Resp:  [20-21] 20 (03/08 0526) BP: (127-153)/(53-72) 127/64 mmHg (03/08 0526) SpO2:  [95 %-100 %] 98 % (03/08 0656) Weight:  [126 lb 4.8 oz (57.289 kg)] 126 lb 4.8 oz (57.289 kg) (03/08 0526)  Intake/Output from previous day: 03/07 0701 - 03/08 0700 In: 480 [P.O.:480] Out: 650 [Urine:650]  General appearance: alert, cooperative and no distress Neurologic: intact Heart: regular rate and rhythm, S1, S2 normal, no murmur, click, rub or gallop Lungs: diminished breath sounds right base Abdomen: soft, non-tender; bowel sounds normal; no masses,  no organomegaly Extremities: extremities normal, atraumatic, no cyanosis or edema Wound: clean and dry  Lab Results:  Recent Labs  06/02/12 1428  WBC 4.6  HGB 11.1*  HCT 33.3*  PLT 209   BMET:  Recent Labs  06/02/12 1428  NA 140  K 4.1  CL 101  CO2 33*  GLUCOSE 116*  BUN 11  CREATININE 0.66  CALCIUM 9.8    PT/INR:  Recent Labs  06/02/12 1428  LABPROT 13.5  INR 1.04   ABG No results found for this basename: phart, pco2, po2, hco3, tco2, acidbasedef, o2sat   CBG (last 3)  No results found for this basename: GLUCAP,  in the last 72 hours  Assessment/Plan: S/P Procedure(s) (LRB): VIDEO BRONCHOSCOPY WITH INSERTION OF INTERBRONCHIAL VALVE (IBV) (N/A)  1. Chest tube in place on right- on water seal, no air leak appreciated, CXR continues to show small right basilar pneumothorax 2. Anxiety- Xanax is ordered, spoke with nurse will administer 3. Dispo- will plan to leave  chest tube in place on water seal, repeat CXR in AM  LOS: 2 days    BARRETT, ERIN 06/04/2012  No shortness of breath, no air leak, CXR shows no pneumo

## 2012-06-05 ENCOUNTER — Inpatient Hospital Stay (HOSPITAL_COMMUNITY): Payer: Medicare Other

## 2012-06-05 LAB — URINE CULTURE: Colony Count: >=100000

## 2012-06-05 NOTE — Progress Notes (Signed)
Procedure(s) (LRB): VIDEO BRONCHOSCOPY WITH INSERTION OF INTERBRONCHIAL VALVE (IBV) (N/A) Subjective:  Patient is doing better this morning.  Less anxious with use of Xanax Objective: Vital signs in last 24 hours: Temp:  [98 F (36.7 C)-98.1 F (36.7 C)] 98 F (36.7 C) (03/09 0533) Pulse Rate:  [94-99] 94 (03/09 0533) Cardiac Rhythm:  [-] Normal sinus rhythm;Sinus tachycardia (03/09 0830) Resp:  [16-20] 20 (03/09 0533) BP: (121-137)/(44-59) 128/59 mmHg (03/09 0533) SpO2:  [98 %-100 %] 100 % (03/09 0533) Weight:  [125 lb 14.4 oz (57.108 kg)] 125 lb 14.4 oz (57.108 kg) (03/09 0533)   Intake/Output from previous day: 03/08 0701 - 03/09 0700 In: 360 [P.O.:360] Out: 650 [Urine:650]  General appearance: alert, cooperative and no distress Heart: regular rate and rhythm Lungs: clear to auscultation bilaterally Abdomen: soft, non-tender; bowel sounds normal; no masses,  no organomegaly Wound: serosanguionous fluid around chest tube site  Lab Results:  Recent Labs  06/02/12 1428  WBC 4.6  HGB 11.1*  HCT 33.3*  PLT 209   BMET:  Recent Labs  06/02/12 1428  NA 140  K 4.1  CL 101  CO2 33*  GLUCOSE 116*  BUN 11  CREATININE 0.66  CALCIUM 9.8    PT/INR:  Recent Labs  06/02/12 1428  LABPROT 13.5  INR 1.04   ABG No results found for this basename: phart, pco2, po2, hco3, tco2, acidbasedef, o2sat   CBG (last 3)  No results found for this basename: GLUCAP,  in the last 72 hours  Assessment/Plan: S/P Procedure(s) (LRB): VIDEO BRONCHOSCOPY WITH INSERTION OF INTERBRONCHIAL VALVE (IBV) (N/A)  1. Chest tube in place on water seal no air leak, CXR remains stable with continue basilar pneumothorax 2. Anxiety- improved with use of Xanax 3. Dispo- leave chest tube in place, CXR in AM    LOS: 3 days    Raford Pitcher, Denny Peon 06/05/2012

## 2012-06-06 ENCOUNTER — Inpatient Hospital Stay (HOSPITAL_COMMUNITY): Payer: Medicare Other

## 2012-06-06 DIAGNOSIS — J930 Spontaneous tension pneumothorax: Secondary | ICD-10-CM

## 2012-06-06 NOTE — Evaluation (Signed)
Physical Therapy Evaluation Patient Details Name: Mckenzie Mclaughlin MRN: [id number] DOB: Jul 29, 1940 Today's Date: 06/06/2012 Time: 4098-1191 PT Time Calculation (min): 26 min  PT Assessment / Plan / Recommendation Clinical Impression  Patient is a 72 yo female admitted with air leak in chest tube, pneumothorax, with placement of interbronchial valve.  Patient had been in hospital, to SNF, and now back to hospital.  Presents with general weakness impacting functional mobility.  Will benefit from acute PT to maximize independence prior to return home with husband.  Patient reports she does not want to go back to SNF.  Husband/family can provide 24 hour assist.  Recommend HHPT for continued therapy at discharge.  Will also need RW.    PT Assessment  Patient needs continued PT services    Follow Up Recommendations  Home health PT;Supervision/Assistance - 24 hour    Does the patient have the potential to tolerate intense rehabilitation      Barriers to Discharge None      Equipment Recommendations  Rolling walker with 5" wheels    Recommendations for Other Services OT consult   Frequency Min 3X/week    Precautions / Restrictions Precautions Precautions: Fall Restrictions Weight Bearing Restrictions: No   Pertinent Vitals/Pain       Mobility  Bed Mobility Bed Mobility: Supine to Sit;Sitting - Scoot to Edge of Bed;Sit to Supine Supine to Sit: 4: Min guard;With rails;HOB elevated Sitting - Scoot to Edge of Bed: 4: Min guard Sit to Supine: 4: Min guard;With rail;HOB elevated Details for Bed Mobility Assistance: Verbal cues for technique.  Assist for safety only. Transfers Transfers: Sit to Stand;Stand to Sit Sit to Stand: 4: Min assist;With upper extremity assist;From bed Stand to Sit: 4: Min assist;With upper extremity assist;To bed Details for Transfer Assistance: Verbal cues for hand placement.  Assist to rise to standing.  Assist for balance in  standing. Ambulation/Gait Ambulation/Gait Assistance: 4: Min assist Ambulation Distance (Feet): 10 Feet Assistive device: Rolling walker Ambulation/Gait Assistance Details: Verbal and physical assistance for use of RW.  Needed assist to maneuver RW in turns.  Verbal cues to stand upright during gait. Gait Pattern: Step-through pattern;Decreased step length - right;Decreased step length - left;Shuffle;Trunk flexed Gait velocity: Slow gait speed    Exercises     PT Diagnosis: Difficulty walking;Abnormality of gait;Generalized weakness  PT Problem List: Decreased strength;Decreased activity tolerance;Decreased balance;Decreased mobility;Decreased knowledge of use of DME;Cardiopulmonary status limiting activity PT Treatment Interventions: DME instruction;Gait training;Stair training;Functional mobility training;Therapeutic exercise;Patient/family education   PT Goals Acute Rehab PT Goals PT Goal Formulation: With patient Time For Goal Achievement: 06/13/12 Potential to Achieve Goals: Good Pt will go Supine/Side to Sit: Independently;with HOB 0 degrees PT Goal: Supine/Side to Sit - Progress: Goal set today Pt will go Sit to Stand: with supervision;with upper extremity assist PT Goal: Sit to Stand - Progress: Goal set today Pt will Ambulate: 51 - 150 feet;with supervision;with rolling walker PT Goal: Ambulate - Progress: Goal set today Pt will Go Up / Down Stairs: 1-2 stairs;with min assist;with rail(s);with least restrictive assistive device PT Goal: Up/Down Stairs - Progress: Goal set today  Visit Information  Last PT Received On: 06/06/12 Assistance Needed: +1    Subjective Data  Subjective: "I'm starting over.  I'm so weak" Patient Stated Goal: To go home.   Prior Functioning  Home Living Lives With: Spouse Available Help at Discharge: Family;Available 24 hours/day Type of Home: House Home Access: Stairs to enter [employer] of Steps: 2 Entrance Stairs-Rails:  Right;Left Home Layout: One level Bathroom Shower/Tub: Engineer, manufacturing systems: Standard Home Adaptive Equipment: None Prior Function Level of Independence: Independent Able to Take Stairs?: Yes Driving: Yes Vocation: Retired Comments: Patient had been at [employer] - returned to hospital. Communication Communication: No difficulties    Cognition  Cognition Overall Cognitive Status: Appears within functional limits for tasks assessed/performed Arousal/Alertness: Awake/alert Orientation Level: Appears intact for tasks assessed Behavior During Session: [facility] for tasks performed    Extremity/Trunk Assessment Right Upper Extremity Assessment RUE ROM/Strength/Tone: WFL for tasks assessed RUE Sensation: WFL - Light Touch Left Upper Extremity Assessment LUE ROM/Strength/Tone: WFL for tasks assessed LUE Sensation: WFL - Light Touch Right Lower Extremity Assessment RLE ROM/Strength/Tone: Deficits RLE ROM/Strength/Tone Deficits: Strength 4-/5 RLE Sensation: WFL - Light Touch RLE Coordination: WFL - gross motor Left Lower Extremity Assessment LLE ROM/Strength/Tone: Deficits LLE ROM/Strength/Tone Deficits: Strength 4-/5 LLE Sensation: WFL - Light Touch LLE Coordination: WFL - gross motor   Balance Balance Balance Assessed: Yes Static Sitting Balance Static Sitting - Balance Support: Right upper extremity supported;Feet supported Static Sitting - Level of Assistance: 5: Stand by assistance Static Sitting - Comment/# of Minutes: 11 minutes with good balance.  Posture flexed. Static Standing Balance Static Standing - Balance Support: Bilateral upper extremity supported Static Standing - Level of Assistance: 4: Min assist Static Standing - Comment/# of Minutes: 3 minutes.  Decreased balance, requiring min assist to remain standing.  Losing balance posteriorly.  End of Session PT - End of Session Equipment Utilized During Treatment: Gait belt;Oxygen Activity Tolerance: Patient  limited by fatigue Patient left: in bed;with call bell/phone within reach;with family/visitor present Nurse Communication: Mobility status  GP     Vena Austria 06/06/2012, 5:05 PM Durenda Hurt. Renaldo Fiddler, [facility] Acute Rehab Services Pager [phone]

## 2012-06-06 NOTE — Progress Notes (Signed)
Removed Chest tube and applied petroleum gauze and tape.  Pt tolerated procedure well.  Dr. Tyrone Sage came by after removal and removed nylon suture from posterior back.  I explained to Dr. Tyrone Sage that sutures around CT were knotted and not tied as we normally see here.  I had difficulty tying sutures around knotted areas and wanted noted that site was not as per normal due to circumstances.  Will continue to monitor.  Pt given pain medication prior to procedure and anxiety medication after. Call bell within reach. Thomas Hoff

## 2012-06-06 NOTE — Progress Notes (Addendum)
                   [address]            [place] [address]          [phone]      Procedure(s) (LRB): VIDEO BRONCHOSCOPY WITH INSERTION OF INTERBRONCHIAL VALVE (IBV) (N/A)  Subjective: Patient eating lunch with a lot of visitors in her room. She has no complaints.  Objective: Vital signs in last 24 hours: Temp:  [97.4 F (36.3 C)-98.6 F (37 C)] 98.6 F (37 C) (03/10 0414) Pulse Rate:  [70-98] 94 (03/10 0414) Cardiac Rhythm:  [-] Normal sinus rhythm (03/09 2028) Resp:  [16-20] 20 (03/10 0414) BP: (132-138)/(51-55) 132/54 mmHg (03/10 0414) SpO2:  [97 %-100 %] 98 % (03/10 0813) Weight:  [57.698 kg (127 lb 3.2 oz)] 57.698 kg (127 lb 3.2 oz) (03/10 0414)     Intake/Output from previous day: 03/09 0701 - 03/10 0700 In: 360 [P.O.:360] Out: 100 [Urine:100]   Physical Exam:  Cardiovascular: RRR Pulmonary: Slightly diminished on the right;clear on the left; no rales, wheezes, or rhonchi. Abdomen: Soft, non tender, bowel sounds present. Extremities: No lower extremity edema. Wounds: Clean and dry.  No erythema or signs of infection.   Lab Results: CBC:No results found for this basename: WBC, HGB, HCT, PLT,  in the last 72 hours BMET: No results found for this basename: NA, K, CL, CO2, GLUCOSE, BUN, CREATININE, CALCIUM,  in the last 72 hours  PT/INR: No results found for this basename: LABPROT, INR,  in the last 72 hours ABG:  INR: Will add last result for INR, ABG once components are confirmed Will add last 4 CBG results once components are confirmed  Assessment/Plan:  1. CV - SR. Continue Toprol XL 25 daily 2.  Pulmonary - Remaining chest tube removed today. Wean O2 as tolerates 3. Continue to increase activity 4.Encourage oral intake   ZIMMERMAN,DONIELLE MPA-C 06/06/2012,12:01 PM  Remaining chest tube out today with follow up chest xray  I have seen and examined [place] and agree with the above assessment  and plan.  Delight Ovens MD Beeper [phone] Office [phone] 06/06/2012 12:28 PM

## 2012-06-07 ENCOUNTER — Inpatient Hospital Stay (HOSPITAL_COMMUNITY): Payer: Medicare Other

## 2012-06-07 DIAGNOSIS — J930 Spontaneous tension pneumothorax: Secondary | ICD-10-CM

## 2012-06-07 LAB — BASIC METABOLIC PANEL
BUN: 11 mg/dL (ref 6–23)
CO2: 35 mEq/L — ABNORMAL HIGH (ref 19–32)
Calcium: 9.6 mg/dL (ref 8.4–10.5)
Chloride: 102 mEq/L (ref 96–112)
Creatinine, Ser: 0.52 mg/dL (ref 0.50–1.10)
GFR calc Af Amer: >90 mL/min (ref >90)
GFR calc non Af Amer: >90 mL/min (ref >90)
Glucose, Bld: 100 mg/dL — ABNORMAL HIGH (ref 70–99)
Potassium: 4.5 mEq/L (ref 3.5–5.1)
Sodium: 140 mEq/L (ref 135–145)

## 2012-06-07 LAB — CBC
HCT: 34.7 % — ABNORMAL LOW (ref 36.0–46.0)
Hemoglobin: 11.1 g/dL — ABNORMAL LOW (ref 12.0–15.0)
MCH: 29.1 pg (ref 26.0–34.0)
MCHC: 32 g/dL (ref 30.0–36.0)
MCV: 91.1 fL (ref 78.0–100.0)
Platelets: 225 10*3/uL (ref 150–400)
RBC: 3.81 MIL/uL — ABNORMAL LOW (ref 3.87–5.11)
RDW: 13.1 % (ref 11.5–15.5)
WBC: 4.4 10*3/uL (ref 4.0–10.5)

## 2012-06-07 MED ORDER — CIPROFLOXACIN HCL 250 MG PO TABS: 250.0000 mg | ORAL_TABLET | Freq: Two times a day (BID) | ORAL | Status: DC

## 2012-06-07 MED ORDER — ASPIRIN 325 MG PO TBEC: 325.0000 mg | DELAYED_RELEASE_TABLET | Freq: Every day | ORAL | Status: DC

## 2012-06-07 MED ORDER — ALPRAZOLAM 0.5 MG PO TABS: 0.5000 mg | ORAL_TABLET | Freq: Two times a day (BID) | ORAL | Status: DC | PRN

## 2012-06-07 MED ORDER — OXYCODONE-ACETAMINOPHEN 5-325 MG PO TABS: 1.0000 | ORAL_TABLET | Freq: Four times a day (QID) | ORAL | Status: DC | PRN

## 2012-06-07 MED ORDER — ALPRAZOLAM 0.5 MG PO TABS: 0.5000 mg | ORAL_TABLET | Freq: Three times a day (TID) | ORAL | Status: DC | PRN

## 2012-06-07 MED ORDER — CIPROFLOXACIN HCL 250 MG PO TABS
250.0000 mg | ORAL_TABLET | Freq: Two times a day (BID) | ORAL | Status: DC
  Administered 2012-06-07 – 2012-06-09 (×5): 250 mg via ORAL
  Filled 2012-06-07 (×7): qty 1

## 2012-06-07 NOTE — Progress Notes (Addendum)
                   [address]            [place] [address]          [phone]      Subjective: Patient states her breathing is ok.  Objective: Vital signs in last 24 hours: Temp:  [97.8 F (36.6 C)-98.1 F (36.7 C)] 98 F (36.7 C) (03/11 0633) Pulse Rate:  [69-102] 93 (03/11 0633) Cardiac Rhythm:  [-] Sinus tachycardia;Normal sinus rhythm (03/10 2059) Resp:  [16-18] 18 (03/11 0633) BP: (123-151)/(51-59) 123/51 mmHg (03/11 0633) SpO2:  [97 %-100 %] 100 % (03/11 0633) Weight:  [56.654 kg (124 lb 14.4 oz)] 56.654 kg (124 lb 14.4 oz) (03/11 8295)     Intake/Output from previous day: 03/10 0701 - 03/11 0700 In: 120 [P.O.:120] Out: -    Physical Exam:  Cardiovascular: RRR Pulmonary: Slightly diminished on the right;clear on the left; no rales, wheezes, or rhonchi. Abdomen: Soft, non tender, bowel sounds present. Extremities: No lower extremity edema. Wound: Clean and dry.  No erythema or signs of infection.   Lab Results: CBC:  Recent Labs  06/07/12 0518  WBC 4.4  HGB 11.1*  HCT 34.7*  PLT 225   BMET:   Recent Labs  06/07/12 0518  NA 140  K 4.5  CL 102  CO2 35*  GLUCOSE 100*  BUN 11  CREATININE 0.52  CALCIUM 9.6    PT/INR: No results found for this basename: LABPROT, INR,  in the last 72 hours ABG:  INR: Will add last result for INR, ABG once components are confirmed Will add last 4 CBG results once components are confirmed  Assessment/Plan:  1. CV - SR. Continue Toprol XL 25 daily 2.  Pulmonary -  CXR this am appears stable.Wean O2 as tolerates 3. Continue to increase activity 4.Encourage oral intake 5UTI- culture showed Pseudomonas Aeruginosa. Sensitive to Cipro so will start.  ZIMMERMAN,DONIELLE MPA-C 06/07/2012,8:12 AM   Now with UTI, no foley in place on this admission Home when physically able to ambulate  I have seen and examined [place] and agree with the above assessment  and plan.  Delight Ovens MD Beeper [phone] Office [phone] 06/07/2012 10:06 AM

## 2012-06-07 NOTE — Progress Notes (Signed)
Physical Therapy Treatment Patient Details Name: PAULENE TAYAG MRN: [id number] DOB: 12/07/1940 Today's Date: 06/07/2012 Time: 4098-1191 PT Time Calculation (min): 29 min  PT Assessment / Plan / Recommendation Comments on Treatment Session  Pt admitted with air leak, PTX and interbronchial valve placed. Pt progressing with mobility and encouraged to be OOB for all meals and to begin walking to the bathroom. Pt benefits from encouragement and reassurance. Sats maintained 98-99% on 2L throughout with HR 98-116    Follow Up Recommendations        Does the patient have the potential to tolerate intense rehabilitation     Barriers to Discharge        Equipment Recommendations       Recommendations for Other Services    Frequency     Plan Discharge plan remains appropriate;Frequency remains appropriate    Precautions / Restrictions Precautions Precautions: Fall   Pertinent Vitals/Pain No pain    Mobility  Bed Mobility Supine to Sit: 5: Supervision;With rails;HOB flat Sitting - Scoot to Edge of Bed: 6: Modified independent (Device/Increase time) Transfers Sit to Stand: 4: Min guard;From bed;From chair/3-in-1 Stand to Sit: 4: Min guard;To chair/3-in-1 Details for Transfer Assistance: cueing for hand placement and sequence Ambulation/Gait Ambulation/Gait Assistance: 4: Min guard Ambulation Distance (Feet): 44 Feet (32' then 67' after seated rest) Assistive device: Rolling walker Ambulation/Gait Assistance Details: cueing for posture and position in RW as well as encouragement to progress mobility Gait Pattern: Step-through pattern;Decreased stride length;Trunk flexed Gait velocity: decreased    Exercises General Exercises - Lower Extremity Long Arc Quad: AROM;Both;15 reps;Seated Hip Flexion/Marching: AROM;Both;15 reps;Seated   PT Diagnosis:    PT Problem List:   PT Treatment Interventions:     PT Goals Acute Rehab PT Goals PT Goal: Supine/Side to Sit - Progress:  Progressing toward goal Pt will go Sit to Stand: with modified independence PT Goal: Sit to Stand - Progress: Updated due to goal met Pt will Ambulate: 51 - 150 feet;with rolling walker PT Goal: Ambulate - Progress: Revised due to lack of progress  Visit Information  Last PT Received On: 06/07/12 Assistance Needed: +1    Subjective Data  Subjective: I just feel tight in my chest   Cognition  Cognition Overall Cognitive Status: Appears within functional limits for tasks assessed/performed Arousal/Alertness: Awake/alert Orientation Level: Appears intact for tasks assessed Behavior During Session: [facility] for tasks performed    Balance     End of Session PT - End of Session Equipment Utilized During Treatment: Gait belt;Oxygen Activity Tolerance: Patient limited by fatigue Patient left: in chair;with call bell/phone within reach;with family/visitor present Nurse Communication: Mobility status   GP     Delorse Lek 06/07/2012, 1:54 PM Delaney Meigs, PT [phone]

## 2012-06-08 ENCOUNTER — Inpatient Hospital Stay (HOSPITAL_COMMUNITY): Payer: Medicare Other

## 2012-06-08 DIAGNOSIS — J930 Spontaneous tension pneumothorax: Secondary | ICD-10-CM

## 2012-06-08 NOTE — Discharge Summary (Signed)
[address]       [place] [address]             [phone]     Mckenzie Mclaughlin 04-Sep-1940 72 y.o. [id number]  06/02/2012   Delight Ovens, MD  PNEUMOTHORAX  HQI:[id number]  Chief Complaint: persistant air leak  HPI: per Dr Tyrone Sage  She is a 72 year old white female who was admitted to [facility] on 04/10/2012 with a complaint of shortness of breath and chest pain. Because of a history of coronary artery disease she was admitted. She does have a history of a coronary angioplasty done to her right coronary artery with stent placement several years ago. She had a chest CT scan done which revealed a new 2 cm right upper lobe mass. She was managed as an inpatient and Dr Thelma Barge saw her in consultation for evaluation of her right upper lobe mass. The right upper lobe mass was new from a prior CT scan and she had a PET scan done which revealed intense uptake in the right upper lobe without evidence of distant disease. However her pulmonary function studies were poor and precluded surgical intervention. She continued to smoke in early January and was on home oxygen therapy. She could not walk more than 5 or 6 steps without becoming short of breath. For all these reasons Dr Thelma Barge did not feel that she was a surgical candidate and she was seen by Dr. Carmina Miller who recommended that she undergo a CT-guided needle biopsy. On January 30 she underwent a percutaneous CT-guided needle biopsy at [facility] and was discharged the same day. The pathology revealed granulomatous inflammation without evidence of malignancy. She was subsequently admitted to our hospital on 05/01/2012 for a large right-sided pneumothorax (presumably from the biopsy). After insertion of a chest tube her lung was expanded but she continued to have a persistent air leak. Manipulation of the suction and of the tube itself failed to completely control her air leak. She was taken to the operating room on  05/05/2012 bt Dr Thelma Barge where she underwent a thoracoscopy with lysis of adhesions and talc pleurodesis. At the time of surgery and we could not identify the air leak. She did have extensive underlying COPD but I could not see the source of her air leak. She continued to have an air leak and despite attempts at lowering or increasing the suction, her air leak persisted but she was ultimately able to transition to a Heimlich valve and was discharged to a rehabilitation facility on 05/16/2012 with a standard 28 French chest tube. She was seen in the office approximately one week later where it was noticed that she continued to have an air leak as demonstrated with air movement through the Heimlich valve. Her x-ray at that time showed no evidence of pneumothorax and she was brought into the hospital on February 27 where I clamped her chest tube with the hope that I could remove her tube if she did not develop a pneumothorax. Shortly after clamping her chest tube we obtained an x-ray which revealed a subpulmonic pneumothorax of approximately 2 cm. The patient remained essentially asymptomatic and we left the tube clamped for another 24 hours. However she developed a larger subpulmonic pneumothorax measuring upwards of 7 cm. The chest tube was unclamped and she had a CT scan performed on February 27 which revealed a loculated basal her pneumothorax of approximately 15-20%. The radiologist placed a small percutaneous pigtail catheter with prompt drainage of the  pneumothorax. A followup chest x-ray showed the lung to be completely expanded again. Earlier this week I attempted a doxycycline pleurodesis at her bedside through the small pigtail catheter. She tolerated the doxycycline pleurodesis poorly with significant pain. We were unable to allow the doxycycline in the chest for only a short period of time. This was despite intrapleural lidocaine,     She is transferred to [facility] for further care  Past Medical  History   Diagnosis  Date   .  COPD, severe  07/29/98   .  Coronary artery disease  01/22/99     Non Q-wave MI, stent RCA   .  GERD (gastroesophageal reflux disease)  08/29/98     severe   .  Hypertension      03/30/00   .  History of ETT  08/03/09     Myoview Normal   .  History of ETT  05/1999     Cardiolite pos ETT, neg Cardiolite   .  Hx of cardiac catheterization  08/29/01     60% RCA 0/w 20-30% lesions   .  Hx of cardiac catheterization  01/22/99     w/Stent 90% RCA lesion   .  History of ETT  09/02/01     Normal   .  History of ETT  04/28/2006     Myoview normal EF 83%   .  History of CT scan of head  08/02/09     w/o mild age appropr atrophy   .  History of blurry vision  05/06-05/10/2009     Hospital [facility] CP R/O'D Blurry vision, smoking   .  History of ETT  04/10/11     normal EF and no ischemia per Dr. Darrold Junker   .  COPD (chronic obstructive pulmonary disease)    .  Anxiety    .  Shortness of breath     Past Surgical History   Procedure  Laterality  Date   .  Laminectomy   1976     Disc Removal X 2 Lumbar   .  Mastectomy   03/1976     Bilateral due FCBD with implants [facility])   .  Esophageal dilation   06/00     EGD   .  Oophorectomy     .  Abdominal hysterectomy      Family History   Problem  Relation  Age of Onset   .  Stroke  Mother    .  Heart disease  Mother      MI   .  Cancer  Father      Lung   .  COPD  Brother    .  Cancer  Brother      Lung with mets 2011   .  Heart disease  Brother      CAD   .  Heart disease  Sister      cirrhosis due heart disease   .  Cirrhosis  Sister    .  Colon cancer  Neg Hx     Social History: reports that she quit smoking about 7 weeks ago. Her smoking use included Cigarettes. She has a 20 pack-year smoking history. She has never used smokeless tobacco. She reports that she does not drink alcohol or use illicit drugs.  Allergies:  Allergies   Allergen  Reactions   .  Amoxicillin  Other (See Comments)      Shortness of breath.   .  Sertraline Hcl  REACTION: trembling    Medications Prior to Admission   Medication  Sig  Dispense  Refill   .  albuterol (PROAIR HFA) 108 (90 BASE) MCG/ACT inhaler  Inhale 2 puffs into the lungs 4 (four) times daily as needed. For shortness of breath.     .  ALPRAZolam (XANAX) 0.5 MG tablet  Take 0.5 mg by mouth 2 (two) times daily.     Marland Kitchen  aspirin 325 MG tablet  Take 325 mg by mouth every morning.     .  cyanocobalamin (,VITAMIN B-12,) 1000 MCG/ML injection  Inject 1,000 mcg into the muscle. She receives the 1st of each month at her doctor's office.     .  Fluticasone-Salmeterol (ADVAIR DISKUS) 100-50 MCG/DOSE AEPB  Inhale 1 puff into the lungs 2 (two) times daily.     .  metoprolol succinate (TOPROL-XL) 25 MG 24 hr tablet  Take 25 mg by mouth every morning.     .  nitroGLYCERIN (NITROSTAT) 0.4 MG SL tablet  Place 0.4 mg under the tongue every 5 (five) minutes as needed. For chest pain.     .  simvastatin (ZOCOR) 10 MG tablet  Take 10 mg by mouth at bedtime.     Marland Kitchen  tiotropium (SPIRIVA HANDIHALER) 18 MCG inhalation capsule  Place 18 mcg into inhaler and inhale at bedtime.        Hospital Course:  The patient has been closely observed during the hospitalization. One of her chest tubes was removed by accident. She has had frequent chest x-ray studies to monitor the pneumothorax. On 06/07/2012 the second chest tube was removed. Currently she has a very small right-sided pneumothorax which appears to be improved over time. Clinically she has some difficulty related to deconditioning and will require home oxygen for the foreseeable future but overall is quite stable and tentatively scheduled for discharge in the morning after reevaluation on rounds.   Recent Labs  06/07/12 0518  NA 140  K 4.5  CL 102  CO2 35*  GLUCOSE 100*  BUN 11  CALCIUM 9.6    Recent Labs  06/07/12 0518  WBC 4.4  HGB 11.1*  HCT 34.7*  PLT 225   No results found for this basename:  INR,  in the last 72 hours   Discharge Instructions:  The patient is discharged to home with extensive instructions on wound care and progressive ambulation.  They are instructed not to drive or perform any heavy lifting until returning to see the physician in his office.  Discharge Diagnosis:  PNEUMOTHORAX  Secondary Diagnosis: Patient Active Problem List  Diagnosis  . LIPOMA  . B12 DEFICIENCY  . NICOTINE ADDICTION  . HYPERTENSION  . CORONARY ARTERY DISEASE  . PREMATURE VENTRICULAR CONTRACTIONS, FREQUENT  . COPD  . GERD  . CONSTIPATION  . MENOPAUSAL SYNDROME  . LOW BACK PAIN, ACUTE  . HEADACHE  . Muscle spasm  . Vertigo  . HLD (hyperlipidemia)  . History of mammogram  . Osteoporosis screening  . Breast mass  . Atypical chest pain  . Right ankle injury  . Cerumen impaction  . Paresthesia  . Edema of foot  . Lung nodule  . Postprocedural air leak   Past Medical History  Diagnosis Date  . COPD, severe 07/29/98  . Coronary artery disease 01/22/99    Non Q-wave MI, stent RCA  . GERD (gastroesophageal reflux disease) 08/29/98    severe  . Hypertension     03/30/00  . History of ETT 08/03/09  Myoview Normal  . History of ETT 05/1999    Cardiolite pos ETT, neg Cardiolite  . Hx of cardiac catheterization 08/29/01    60% RCA 0/w 20-30% lesions  . Hx of cardiac catheterization 01/22/99    w/Stent 90% RCA lesion  . History of ETT 09/02/01    Normal  . History of ETT 04/28/2006    Myoview normal EF 83%  . History of CT scan of head 08/02/09    w/o mild age appropr atrophy  . History of blurry vision 05/06-05/10/2009    Hospital [facility] CP R/O'D Blurry vision, smoking  . History of ETT 04/10/11    normal EF and no ischemia per Dr. Darrold Junker  . COPD (chronic obstructive pulmonary disease)   . Anxiety   . Shortness of breath         Medication List    TAKE these medications       albuterol 108 (90 BASE) MCG/ACT inhaler  Commonly known as:  PROVENTIL  HFA;VENTOLIN HFA  Inhale 2 puffs into the lungs 4 (four) times daily as needed for wheezing or shortness of breath.     ALPRAZolam 0.5 MG tablet  Commonly known as:  XANAX  Take 1 tablet (0.5 mg total) by mouth 3 (three) times daily as needed for sleep.     aspirin 325 MG EC tablet  Take 1 tablet (325 mg total) by mouth daily.     budesonide-formoterol 160-4.5 MCG/ACT inhaler  Commonly known as:  SYMBICORT  Inhale 2 puffs into the lungs 2 (two) times daily.     ciprofloxacin 250 MG tablet  Commonly known as:  CIPRO  Take 1 tablet (250 mg total) by mouth 2 (two) times daily. For 3 days then stop     cyanocobalamin 1000 MCG/ML injection  Commonly known as:  (VITAMIN B-12)  Inject 1,000 mcg into the muscle every 8 (eight) weeks. Pt receives at MD office     Fluticasone-Salmeterol 100-50 MCG/DOSE Aepb  Commonly known as:  ADVAIR  Inhale 1 puff into the lungs 2 (two) times daily.     metoprolol succinate 25 MG 24 hr tablet  Commonly known as:  TOPROL-XL  Take 25 mg by mouth every morning.     nitroGLYCERIN 0.4 MG SL tablet  Commonly known as:  NITROSTAT  Place 0.4 mg under the tongue every 5 (five) minutes as needed. For chest pain.     oxyCODONE-acetaminophen 5-325 MG per tablet  Commonly known as:  PERCOCET/ROXICET  Take 1 tablet by mouth every 6 (six) hours as needed for pain.     simvastatin 10 MG tablet  Commonly known as:  ZOCOR  Take 10 mg by mouth at bedtime.     SPIRIVA HANDIHALER 18 MCG inhalation capsule  Generic drug:  tiotropium  Place 18 mcg into inhaler and inhale at bedtime.       Follow-up Information   Follow up with GERHARDT,EDWARD B, MD. (PA/LAT CXR to be taken (at [facility] which is in the same building as Dr. Dennie Maizes office) on 06/30/2012 at 8:30 am;Appointment with Dr. Tyrone Sage is on 06/30/2012 at 9:30 am)    Contact information:   [address] [place] [address] [phone]       Follow up with Ned Clines, MD.  (Call for a follow up appointment as soon as possible)    Contact information:   [address] [place] [address] [phone]      Disposition: For discharge home with home  PT and oxygen arranged  Patient's condition is Good  Gershon Crane, PA-C 06/08/2012  11:34 AM

## 2012-06-08 NOTE — Progress Notes (Addendum)
[address]       [place] [address]             [phone]       Procedure(s) (LRB): VIDEO BRONCHOSCOPY WITH INSERTION OF INTERBRONCHIAL VALVE (IBV) (N/A) Subjective: Feels better  Objective  Telemetry sr,stachy, pac's  Temp:  [97.8 F (36.6 C)-98 F (36.7 C)] 98 F (36.7 C) (03/12 0427) Pulse Rate:  [97-115] 97 (03/12 0427) Resp:  [16-18] 18 (03/12 0427) BP: (116-120)/(46-72) 116/72 mmHg (03/12 0427) SpO2:  [96 %-98 %] 96 % (03/12 0853) Weight:  [124 lb 1.6 oz (56.291 kg)] 124 lb 1.6 oz (56.291 kg) (03/12 0427)   Intake/Output Summary (Last 24 hours) at 06/08/12 0958 Last data filed at 06/08/12 0622  Gross per 24 hour  Intake    960 ml  Output      0 ml  Net    960 ml       General appearance: alert, cooperative and no distress Heart: irregularly irregular rhythm Lungs: diminished air exchange but clear, no wheeze Abdomen: benign Extremities: no edema Wound: N/A  Lab Results:  Recent Labs  06/07/12 0518  NA 140  K 4.5  CL 102  CO2 35*  GLUCOSE 100*  BUN 11  CREATININE 0.52  CALCIUM 9.6   No results found for this basename: AST, ALT, ALKPHOS, BILITOT, PROT, ALBUMIN,  in the last 72 hours No results found for this basename: LIPASE, AMYLASE,  in the last 72 hours  Recent Labs  06/07/12 0518  WBC 4.4  HGB 11.1*  HCT 34.7*  MCV 91.1  PLT 225   No results found for this basename: CKTOTAL, CKMB, TROPONINI,  in the last 72 hours No components found with this basename: POCBNP,  No results found for this basename: DDIMER,  in the last 72 hours No results found for this basename: HGBA1C,  in the last 72 hours No results found for this basename: CHOL, HDL, LDLCALC, TRIG, CHOLHDL,  in the last 72 hours No results found for this basename: TSH, T4TOTAL, FREET3, T3FREE, THYROIDAB,  in the last 72 hours No results found for this basename: VITAMINB12, FOLATE, FERRITIN, TIBC, IRON, RETICCTPCT,  in the last 72  hours  Medications: Scheduled . aspirin EC  325 mg Oral Daily  . ciprofloxacin  250 mg Oral BID  . [START ON 06/28/2012] cyanocobalamin  1,000 mcg Intramuscular Q30 days  . docusate sodium  100 mg Oral BID  . enoxaparin (LOVENOX) injection  40 mg Subcutaneous Q24H  . ENSURE  237 mL Oral TID WC  . metoprolol succinate  25 mg Oral q morning - 10a  . mometasone-formoterol  2 puff Inhalation BID  . simvastatin  10 mg Oral QHS  . sodium chloride  3 mL Intravenous Q12H  . sodium chloride  3 mL Intravenous Q12H  . tiotropium  18 mcg Inhalation QHS     Radiology/Studies:  Dg Chest 2 View  06/08/2012  *RADIOLOGY REPORT*  Clinical Data: Right pneumothorax.  CHEST - 2 VIEW  Comparison: 03/11 and 06/06/2012  Findings: There has been a decrease in the small  basal and lateral components of the right pneumothorax.  No acute infiltrates.  Vascularity and heart size are normal. Hyperinflation consistent with the known emphysema.  IMPRESSION: Further decrease in the small right pneumothorax.   Original Report Authenticated By: Francene Boyers, M.D.    Dg Chest 2 View  06/07/2012  *RADIOLOGY REPORT*  Clinical Data: Chest tube removal.  CHEST - 2  VIEW  Comparison: June 06, 2012.  Findings: Cardiomediastinal silhouette appears normal.  Left lung is clear.  Blunting of costophrenic angles is noted bilaterally most consistent with scarring.  No change is noted involving minimal right basilar pneumothorax compared to prior exam. Curvilinear density seen laterally in right upper lobe is noted again and may represent either old chest tube track or some degree of pneumothorax is well.  It is unchanged.  IMPRESSION: No significant change compared to prior exam in terms of right- sided pneumothorax.   Original Report Authenticated By: Lupita Raider.,  M.D.    Dg Chest Port 1 View  06/06/2012  *RADIOLOGY REPORT*  Clinical Data: Status post chest tube removal.  PORTABLE CHEST - 1 VIEW  Comparison: 06/06/2012  Findings:  Right-sided chest tube has been removed. A small tract persists along the right lateral lung following chest tube removal. Small right basilar pneumothorax is noted, approximately 10% of lung volume.  There is minimal bibasilar atelectasis.  There are coarse perihilar markings, consistent with bronchitic changes.  No pulmonary edema.  IMPRESSION:  1.  Interval removal of right-sided chest tube. 2.  Right basilar pneumothorax.   Original Report Authenticated By: Norva Pavlov, M.D.     INR: Will add last result for INR, ABG once components are confirmed Will add last 4 CBG results once components are confirmed  Assessment/Plan:  1 stable on O2, needs physical rehab for deconditioning. Poss home in am     LOS: 6 days    GOLD,WAYNE E 3/12/20149:58 AM  Plan d/c tomorrow with home nursing and PT Does not want to go to [facility] I have seen and examined [place] and agree with the above assessment  and plan.  Delight Ovens MD Beeper [phone] Office [phone] 06/08/2012 10:25 AM

## 2012-06-08 NOTE — Care Management Note (Signed)
    Page 1 of 2   06/09/2012     3:09:06 PM   CARE MANAGEMENT NOTE 06/09/2012  Patient:  Mckenzie Mclaughlin, Mckenzie Mclaughlin   Account Number:  [id number]  Date Initiated:  06/08/2012  Documentation initiated by:  AMERSON,JULIE  Subjective/Objective Assessment:   PT ADM WITH PNEUMOTHORAX, SOB ON 06/02/12.  PTA, PT RESIDED AT [facility], WHERE SHE WAS RECEIVING REHAB.     Action/Plan:   PT FOR LIKELY DC TOMORROW.  SHE HAS HOME OXYGEN THROUGH [facility].  WILL ARRANGE HH  SERVICE PER PT CHOICE TO [facility] FOR HH FOLLOW UP.  START OF CARE 24-48H POST DC DATE.   Anticipated DC Date:  06/09/2012   Anticipated DC Plan:  HOME W HOME HEALTH SERVICES      DC Planning Services  CM consult      [facility]  HOME HEALTH   Choice offered to / List presented to:  C-1 Patient   DME arranged  [facility]  SHOWER STOOL  BEDSIDE COMMODE      DME agency  [facility]     HH arranged  HH-2 PT  HH-3 OT  HH-1 RN      [facility] agency  [facility]   Status of service:  Completed, signed off Medicare Important Message given?   (If response is "NO", the following Medicare IM given date fields will be blank) Date Medicare IM given:   Date Additional Medicare IM given:    Discharge Disposition:  HOME W HOME HEALTH SERVICES  Per UR Regulation:  Reviewed for med. necessity/level of care/duration of stay  If discussed at Long Length of Stay Meetings, dates discussed:   06/09/2012    Comments:  06/09/12 JULIE AMERSON,RN,BSN [phone] PT FOR DC HOME TODAY.  NOTIFIED [facility] OF DC; DME DELIVERED TO PT ROOM PRIOR TO DC.

## 2012-06-09 DIAGNOSIS — J930 Spontaneous tension pneumothorax: Secondary | ICD-10-CM

## 2012-06-09 LAB — CREATININE, SERUM
GFR calc Af Amer: >90 mL/min (ref >90)
GFR calc non Af Amer: >90 mL/min (ref >90)

## 2012-06-09 NOTE — Progress Notes (Addendum)
[address]       [place] [address]             [phone]       Procedure(s) (LRB): VIDEO BRONCHOSCOPY WITH INSERTION OF INTERBRONCHIAL VALVE (IBV) (N/A) Subjective: Feels ok, + DOE but stable  Objective  Telemetry SR/Tach pac's , pvc's  Temp:  [97.4 F (36.3 C)-98.1 F (36.7 C)] 97.4 F (36.3 C) (03/13 0500) Pulse Rate:  [101-116] 116 (03/13 0500) Resp:  [16-22] 22 (03/13 0500) BP: (114-140)/(45-63) 140/59 mmHg (03/13 0500) SpO2:  [96 %-99 %] 97 % (03/13 0500) Weight:  [125 lb (56.7 kg)] 125 lb (56.7 kg) (03/13 0500)   Intake/Output Summary (Last 24 hours) at 06/09/12 0801 Last data filed at 06/08/12 1742  Gross per 24 hour  Intake    480 ml  Output      0 ml  Net    480 ml       General appearance: alert, cooperative and no distress Heart: regular rate and rhythm Lungs: clear to auscultation bilaterally and fair air exchange Abdomen: benign Extremities: no edema Wound: ok  Lab Results:  Recent Labs  06/07/12 0518 06/09/12 0540  NA 140  --   K 4.5  --   CL 102  --   CO2 35*  --   GLUCOSE 100*  --   BUN 11  --   CREATININE 0.52 0.54  CALCIUM 9.6  --    No results found for this basename: AST, ALT, ALKPHOS, BILITOT, PROT, ALBUMIN,  in the last 72 hours No results found for this basename: LIPASE, AMYLASE,  in the last 72 hours  Recent Labs  06/07/12 0518  WBC 4.4  HGB 11.1*  HCT 34.7*  MCV 91.1  PLT 225   No results found for this basename: CKTOTAL, CKMB, TROPONINI,  in the last 72 hours No components found with this basename: POCBNP,  No results found for this basename: DDIMER,  in the last 72 hours No results found for this basename: HGBA1C,  in the last 72 hours No results found for this basename: CHOL, HDL, LDLCALC, TRIG, CHOLHDL,  in the last 72 hours No results found for this basename: TSH, T4TOTAL, FREET3, T3FREE, THYROIDAB,  in the last 72 hours No results found for this basename: VITAMINB12, FOLATE, FERRITIN,  TIBC, IRON, RETICCTPCT,  in the last 72 hours  Medications: Scheduled . aspirin EC  325 mg Oral Daily  . ciprofloxacin  250 mg Oral BID  . [START ON 06/28/2012] cyanocobalamin  1,000 mcg Intramuscular Q30 days  . docusate sodium  100 mg Oral BID  . enoxaparin (LOVENOX) injection  40 mg Subcutaneous Q24H  . ENSURE  237 mL Oral TID WC  . metoprolol succinate  25 mg Oral q morning - 10a  . mometasone-formoterol  2 puff Inhalation BID  . simvastatin  10 mg Oral QHS  . sodium chloride  3 mL Intravenous Q12H  . sodium chloride  3 mL Intravenous Q12H  . tiotropium  18 mcg Inhalation QHS     Radiology/Studies:  Dg Chest 2 View  06/08/2012  *RADIOLOGY REPORT*  Clinical Data: Right pneumothorax.  CHEST - 2 VIEW  Comparison: 03/11 and 06/06/2012  Findings: There has been a decrease in the small  basal and lateral components of the right pneumothorax.  No acute infiltrates.  Vascularity and heart size are normal. Hyperinflation consistent with the known emphysema.  IMPRESSION: Further decrease in the small right pneumothorax.   Original  Report Authenticated By: Francene Boyers, M.D.     INR: Will add last result for INR, ABG once components are confirmed Will add last 4 CBG results once components are confirmed  Assessment/Plan: 1 feels ok, stable for discharge    LOS: 7 days    GOLD,WAYNE E 3/13/20148:01 AM  Home today I have seen and examined [facility] and agree with the above assessment  and plan.  Delight Ovens MD Beeper [phone] Office [phone] 06/09/2012 8:36 AM

## 2012-06-17 ENCOUNTER — Emergency Department (HOSPITAL_COMMUNITY): Payer: Medicare Other

## 2012-06-17 ENCOUNTER — Encounter (HOSPITAL_COMMUNITY): Payer: Self-pay | Admitting: Adult Health

## 2012-06-17 ENCOUNTER — Inpatient Hospital Stay (HOSPITAL_COMMUNITY)
Admission: EM | Admit: 2012-06-17 | Discharge: 2012-06-22 | DRG: 190 | Disposition: A | Discharge status: Home Health | Payer: Medicare Other | Attending: Internal Medicine | Admitting: Internal Medicine

## 2012-06-17 DIAGNOSIS — R0789 Other chest pain: Secondary | ICD-10-CM

## 2012-06-17 DIAGNOSIS — R51 Headache: Secondary | ICD-10-CM

## 2012-06-17 DIAGNOSIS — R202 Paresthesia of skin: Secondary | ICD-10-CM

## 2012-06-17 DIAGNOSIS — Z9289 Personal history of other medical treatment: Secondary | ICD-10-CM

## 2012-06-17 DIAGNOSIS — Z13820 Encounter for screening for osteoporosis: Secondary | ICD-10-CM

## 2012-06-17 DIAGNOSIS — I252 Old myocardial infarction: Secondary | ICD-10-CM

## 2012-06-17 DIAGNOSIS — F411 Generalized anxiety disorder: Secondary | ICD-10-CM

## 2012-06-17 DIAGNOSIS — Z8249 Family history of ischemic heart disease and other diseases of the circulatory system: Secondary | ICD-10-CM

## 2012-06-17 DIAGNOSIS — N951 Menopausal and female climacteric states: Secondary | ICD-10-CM

## 2012-06-17 DIAGNOSIS — R42 Dizziness and giddiness: Secondary | ICD-10-CM

## 2012-06-17 DIAGNOSIS — Z9981 Dependence on supplemental oxygen: Secondary | ICD-10-CM

## 2012-06-17 DIAGNOSIS — M545 Low back pain, unspecified: Secondary | ICD-10-CM

## 2012-06-17 DIAGNOSIS — J4489 Other specified chronic obstructive pulmonary disease: Secondary | ICD-10-CM

## 2012-06-17 DIAGNOSIS — Z823 Family history of stroke: Secondary | ICD-10-CM

## 2012-06-17 DIAGNOSIS — N63 Unspecified lump in unspecified breast: Secondary | ICD-10-CM

## 2012-06-17 DIAGNOSIS — R911 Solitary pulmonary nodule: Secondary | ICD-10-CM

## 2012-06-17 DIAGNOSIS — I4949 Other premature depolarization: Secondary | ICD-10-CM

## 2012-06-17 DIAGNOSIS — I10 Essential (primary) hypertension: Secondary | ICD-10-CM

## 2012-06-17 DIAGNOSIS — J441 Chronic obstructive pulmonary disease with (acute) exacerbation: Principal | ICD-10-CM

## 2012-06-17 DIAGNOSIS — E43 Unspecified severe protein-calorie malnutrition: Secondary | ICD-10-CM

## 2012-06-17 DIAGNOSIS — E785 Hyperlipidemia, unspecified: Secondary | ICD-10-CM

## 2012-06-17 DIAGNOSIS — M62838 Other muscle spasm: Secondary | ICD-10-CM

## 2012-06-17 DIAGNOSIS — D179 Benign lipomatous neoplasm, unspecified: Secondary | ICD-10-CM

## 2012-06-17 DIAGNOSIS — I2510 Atherosclerotic heart disease of native coronary artery without angina pectoris: Secondary | ICD-10-CM

## 2012-06-17 DIAGNOSIS — E538 Deficiency of other specified B group vitamins: Secondary | ICD-10-CM

## 2012-06-17 DIAGNOSIS — K219 Gastro-esophageal reflux disease without esophagitis: Secondary | ICD-10-CM

## 2012-06-17 DIAGNOSIS — Z87891 Personal history of nicotine dependence: Secondary | ICD-10-CM

## 2012-06-17 DIAGNOSIS — F17200 Nicotine dependence, unspecified, uncomplicated: Secondary | ICD-10-CM

## 2012-06-17 DIAGNOSIS — K5900 Constipation, unspecified: Secondary | ICD-10-CM

## 2012-06-17 DIAGNOSIS — J449 Chronic obstructive pulmonary disease, unspecified: Secondary | ICD-10-CM | POA: Diagnosis present

## 2012-06-17 DIAGNOSIS — R600 Localized edema: Secondary | ICD-10-CM

## 2012-06-17 DIAGNOSIS — IMO0002 Reserved for concepts with insufficient information to code with codable children: Secondary | ICD-10-CM

## 2012-06-17 DIAGNOSIS — Z809 Family history of malignant neoplasm, unspecified: Secondary | ICD-10-CM

## 2012-06-17 DIAGNOSIS — J95812 Postprocedural air leak: Secondary | ICD-10-CM

## 2012-06-17 HISTORY — DX: Pneumothorax, unspecified: J93.9

## 2012-06-17 LAB — POCT I-STAT 3, ART BLOOD GAS (G3+)
Patient temperature: 98.6
pH, Arterial: 7.424 (ref 7.350–7.450)

## 2012-06-17 LAB — BASIC METABOLIC PANEL
Chloride: 102 mEq/L (ref 96–112)
GFR calc Af Amer: >90 mL/min (ref >90)
GFR calc non Af Amer: >90 mL/min (ref >90)
Potassium: 3.4 mEq/L — ABNORMAL LOW (ref 3.5–5.1)
Sodium: 140 mEq/L (ref 135–145)

## 2012-06-17 LAB — POCT I-STAT TROPONIN I: Troponin i, poc: 0.01 ng/mL (ref 0.00–0.08)

## 2012-06-17 LAB — CBC
MCHC: 33.2 g/dL (ref 30.0–36.0)
RDW: 13.9 % (ref 11.5–15.5)
WBC: 5.3 10*3/uL (ref 4.0–10.5)

## 2012-06-17 MED ORDER — ALBUTEROL (5 MG/ML) CONTINUOUS INHALATION SOLN
INHALATION_SOLUTION | RESPIRATORY_TRACT | Status: AC
  Filled 2012-06-17: qty 20

## 2012-06-17 MED ORDER — METHYLPREDNISOLONE SODIUM SUCC 125 MG IJ SOLR
60.0000 mg | INTRAMUSCULAR | Status: DC
  Administered 2012-06-18: 09:00:00 via INTRAVENOUS
  Administered 2012-06-18: 60 mg via INTRAVENOUS
  Filled 2012-06-17 (×8): qty 0.96

## 2012-06-17 MED ORDER — SODIUM CHLORIDE 0.9 % IJ SOLN
3.0000 mL | Freq: Two times a day (BID) | INTRAMUSCULAR | Status: DC
  Administered 2012-06-18 – 2012-06-22 (×9): 3 mL via INTRAVENOUS

## 2012-06-17 MED ORDER — ALBUTEROL SULFATE (5 MG/ML) 0.5% IN NEBU
2.5000 mg | INHALATION_SOLUTION | RESPIRATORY_TRACT | Status: DC | PRN
  Administered 2012-06-18 – 2012-06-19 (×3): 2.5 mg via RESPIRATORY_TRACT
  Filled 2012-06-17 (×3): qty 0.5

## 2012-06-17 MED ORDER — METHYLPREDNISOLONE SODIUM SUCC 125 MG IJ SOLR
125.0000 mg | Freq: Once | INTRAMUSCULAR | Status: AC
  Administered 2012-06-17: 125 mg via INTRAVENOUS
  Filled 2012-06-17: qty 2

## 2012-06-17 MED ORDER — CYANOCOBALAMIN 1000 MCG/ML IJ SOLN: 1000.0000 ug | INTRAMUSCULAR | Status: DC

## 2012-06-17 MED ORDER — TIOTROPIUM BROMIDE MONOHYDRATE 18 MCG IN CAPS
18.0000 ug | ORAL_CAPSULE | Freq: Every day | RESPIRATORY_TRACT | Status: DC
  Administered 2012-06-18 – 2012-06-20 (×4): 18 ug via RESPIRATORY_TRACT
  Filled 2012-06-17: qty 5

## 2012-06-17 MED ORDER — ONDANSETRON HCL 4 MG/2ML IJ SOLN: 4.0000 mg | Freq: Four times a day (QID) | INTRAMUSCULAR | Status: DC | PRN

## 2012-06-17 MED ORDER — ALBUTEROL SULFATE (5 MG/ML) 0.5% IN NEBU
2.5000 mg | INHALATION_SOLUTION | Freq: Four times a day (QID) | RESPIRATORY_TRACT | Status: DC
  Administered 2012-06-18 – 2012-06-19 (×8): 2.5 mg via RESPIRATORY_TRACT
  Filled 2012-06-17 (×9): qty 0.5

## 2012-06-17 MED ORDER — SODIUM CHLORIDE 0.9 % IJ SOLN
3.0000 mL | INTRAMUSCULAR | Status: DC | PRN
  Administered 2012-06-22: 3 mL via INTRAVENOUS

## 2012-06-17 MED ORDER — METOPROLOL SUCCINATE ER 25 MG PO TB24
25.0000 mg | ORAL_TABLET | Freq: Every morning | ORAL | Status: DC
  Administered 2012-06-18 – 2012-06-22 (×5): 25 mg via ORAL
  Filled 2012-06-17 (×5): qty 1

## 2012-06-17 MED ORDER — SODIUM CHLORIDE 0.9 % IV SOLN: 250.0000 mL | INTRAVENOUS | Status: DC | PRN

## 2012-06-17 MED ORDER — SIMVASTATIN 10 MG PO TABS
10.0000 mg | ORAL_TABLET | Freq: Every day | ORAL | Status: DC
  Administered 2012-06-18 – 2012-06-21 (×5): 10 mg via ORAL
  Filled 2012-06-17 (×6): qty 1

## 2012-06-17 MED ORDER — MOMETASONE FURO-FORMOTEROL FUM 100-5 MCG/ACT IN AERO: 2.0000 | INHALATION_SPRAY | Freq: Two times a day (BID) | RESPIRATORY_TRACT | Status: DC

## 2012-06-17 MED ORDER — ENOXAPARIN SODIUM 40 MG/0.4ML ~~LOC~~ SOLN
40.0000 mg | Freq: Every day | SUBCUTANEOUS | Status: DC
  Administered 2012-06-18 – 2012-06-22 (×5): 40 mg via SUBCUTANEOUS
  Filled 2012-06-17 (×5): qty 0.4

## 2012-06-17 MED ORDER — SODIUM CHLORIDE 0.9 % IJ SOLN
3.0000 mL | Freq: Two times a day (BID) | INTRAMUSCULAR | Status: DC
Start: 2012-06-18 — End: 2012-06-22
  Administered 2012-06-18 – 2012-06-21 (×4): 3 mL via INTRAVENOUS

## 2012-06-17 MED ORDER — ALBUTEROL SULFATE (5 MG/ML) 0.5% IN NEBU
5.0000 mg | INHALATION_SOLUTION | Freq: Once | RESPIRATORY_TRACT | Status: AC
  Administered 2012-06-17: 5 mg via RESPIRATORY_TRACT
  Filled 2012-06-17: qty 1

## 2012-06-17 MED ORDER — ALPRAZOLAM 0.5 MG PO TABS
0.5000 mg | ORAL_TABLET | Freq: Three times a day (TID) | ORAL | Status: DC | PRN
  Administered 2012-06-18 – 2012-06-20 (×9): 0.5 mg via ORAL
  Filled 2012-06-17 (×9): qty 1

## 2012-06-17 MED ORDER — ASPIRIN EC 325 MG PO TBEC
325.0000 mg | DELAYED_RELEASE_TABLET | Freq: Every day | ORAL | Status: DC
  Administered 2012-06-18 – 2012-06-22 (×5): 325 mg via ORAL
  Filled 2012-06-17 (×5): qty 1

## 2012-06-17 MED ORDER — IPRATROPIUM BROMIDE 0.02 % IN SOLN
0.5000 mg | Freq: Once | RESPIRATORY_TRACT | Status: AC
  Administered 2012-06-17: 0.5 mg via RESPIRATORY_TRACT
  Filled 2012-06-17: qty 2.5

## 2012-06-17 MED ORDER — ALBUTEROL SULFATE (5 MG/ML) 0.5% IN NEBU
10.0000 mg | INHALATION_SOLUTION | Freq: Once | RESPIRATORY_TRACT | Status: AC
  Administered 2012-06-17: 10 mg via RESPIRATORY_TRACT
  Filled 2012-06-17: qty 0.5

## 2012-06-17 MED ORDER — HYDROMORPHONE HCL PF 1 MG/ML IJ SOLN: 1.0000 mg | Freq: Once | INTRAMUSCULAR | Status: DC

## 2012-06-17 MED ORDER — LEVOFLOXACIN IN D5W 500 MG/100ML IV SOLN
500.0000 mg | Freq: Every day | INTRAVENOUS | Status: DC
  Administered 2012-06-18 – 2012-06-20 (×4): 500 mg via INTRAVENOUS
  Filled 2012-06-17 (×5): qty 100

## 2012-06-17 MED ORDER — BUDESONIDE-FORMOTEROL FUMARATE 160-4.5 MCG/ACT IN AERO
2.0000 | INHALATION_SPRAY | Freq: Two times a day (BID) | RESPIRATORY_TRACT | Status: DC
  Administered 2012-06-18 – 2012-06-22 (×9): 2 via RESPIRATORY_TRACT
  Filled 2012-06-17: qty 6

## 2012-06-17 MED ORDER — ONDANSETRON HCL 4 MG PO TABS: 4.0000 mg | ORAL_TABLET | Freq: Four times a day (QID) | ORAL | Status: DC | PRN

## 2012-06-17 NOTE — ED Notes (Signed)
TRANSPORTED TO [address] UNIT IN STABLE CONDITION .

## 2012-06-17 NOTE — ED Notes (Signed)
Presents with SOB that began at 16:41, states, I feel like I could not take a deep breath, recently had lung biopsy and pneumothorax on right lung. SAts 98%, denies pain, rhonchi and diminished lung sounds on right.

## 2012-06-17 NOTE — ED Provider Notes (Signed)
History     CSN: [id number]  Arrival date & time 06/17/12  1734   First MD Initiated Contact with Patient 06/17/12 1802      Chief Complaint  Patient presents with  . Shortness of Breath    (Consider location/radiation/quality/duration/timing/severity/associated sxs/prior treatment) The history is provided by the patient.  Mckenzie Mclaughlin is a 72 y.o. female history of severe COPD, CAD, recent pneumothorax with chest tube recently taken out here presenting with shortness of breath. Acute onset of shortness of breath starting at 4 PM today. She was sitting and was not exerting herself at the time. No chest pain. Denies any leg swelling or heart failure. No history of DVT or PE but she was admitted to the hospital recently for pneumothorax and had a chest tube.    Past Medical History  Diagnosis Date  . COPD, severe 07/29/98  . Coronary artery disease 01/22/99    Non Q-wave MI, stent RCA  . GERD (gastroesophageal reflux disease) 08/29/98    severe  . Hypertension     03/30/00  . History of ETT 08/03/09    Myoview Normal  . History of ETT 05/1999    Cardiolite pos ETT, neg Cardiolite  . Hx of cardiac catheterization 08/29/01    60% RCA 0/w 20-30% lesions  . Hx of cardiac catheterization 01/22/99    w/Stent 90% RCA lesion  . History of ETT 09/02/01    Normal  . History of ETT 04/28/2006    Myoview normal EF 83%  . History of CT scan of head 08/02/09    w/o mild age appropr atrophy  . History of blurry vision 05/06-05/10/2009    Hospital [facility] CP R/O'D Blurry vision, smoking  . History of ETT 04/10/11    normal EF and no ischemia per Dr. Darrold Junker  . COPD (chronic obstructive pulmonary disease)   . Anxiety   . Shortness of breath   . Pneumothorax     Past Surgical History  Procedure Laterality Date  . Laminectomy  1976    Disc Removal X 2 Lumbar  . Mastectomy  03/1976    Bilateral due FCBD with implants [facility])  . Esophageal dilation  06/00    EGD  . Oophorectomy     . Abdominal hysterectomy      Family History  Problem Relation Age of Onset  . Stroke Mother   . Heart disease Mother     MI  . Cancer Father     Lung  . COPD Brother   . Cancer Brother     Lung with mets 2011  . Heart disease Brother     CAD  . Heart disease Sister     cirrhosis due heart disease  . Cirrhosis Sister   . Colon cancer Neg Hx     History  Substance Use Topics  . Smoking status: Former Smoker -- 0.40 packs/day for 50 years    Types: Cigarettes    Quit date: 04/10/2012  . Smokeless tobacco: Never Used     Comment: 1/4 PPD as of 2012  . Alcohol Use: No    OB History   Grav Para Term Preterm Abortions TAB SAB Ect Mult Living                  Review of Systems  Respiratory: Positive for shortness of breath.   All other systems reviewed and are negative.    Allergies  Amoxicillin and Sertraline hcl  Home Medications   Current Outpatient  Rx  Name  Route  Sig  Dispense  Refill  . albuterol (PROVENTIL HFA;VENTOLIN HFA) 108 (90 BASE) MCG/ACT inhaler   Inhalation   Inhale 2 puffs into the lungs 4 (four) times daily as needed for wheezing or shortness of breath.         . ALPRAZolam (XANAX) 0.5 MG tablet   Oral   Take 1 tablet (0.5 mg total) by mouth 3 (three) times daily as needed for sleep.         Marland Kitchen aspirin EC 325 MG EC tablet   Oral   Take 1 tablet (325 mg total) by mouth daily.   30 tablet      . budesonide-formoterol (SYMBICORT) 160-4.5 MCG/ACT inhaler   Inhalation   Inhale 2 puffs into the lungs 2 (two) times daily.         . ciprofloxacin (CIPRO) 250 MG tablet   Oral   Take 1 tablet (250 mg total) by mouth 2 (two) times daily. For 3 days then stop   6 tablet   0   . cyanocobalamin (,VITAMIN B-12,) 1000 MCG/ML injection   Intramuscular   Inject 1,000 mcg into the muscle every 8 (eight) weeks. Pt receives at MD office         . Fluticasone-Salmeterol (ADVAIR) 100-50 MCG/DOSE AEPB   Inhalation   Inhale 1 puff into the  lungs 2 (two) times daily.         . metoprolol succinate (TOPROL-XL) 25 MG 24 hr tablet   Oral   Take 25 mg by mouth every morning.          . nitroGLYCERIN (NITROSTAT) 0.4 MG SL tablet   Sublingual   Place 0.4 mg under the tongue every 5 (five) minutes as needed. For chest pain.         Marland Kitchen oxyCODONE-acetaminophen (PERCOCET/ROXICET) 5-325 MG per tablet   Oral   Take 1 tablet by mouth every 6 (six) hours as needed for pain.   30 tablet   0   . simvastatin (ZOCOR) 10 MG tablet   Oral   Take 10 mg by mouth at bedtime.         Marland Kitchen tiotropium (SPIRIVA HANDIHALER) 18 MCG inhalation capsule   Inhalation   Place 18 mcg into inhaler and inhale at bedtime.            BP 126/60  Pulse 96  Temp(Src) 98.9 F (37.2 C) (Oral)  Resp 17  SpO2 100%  Physical Exam  Nursing note and vitals reviewed. Constitutional: She is oriented to person, place, and time.  Tachypneic, talking in full sentences   HENT:  Head: Normocephalic.  Mouth/Throat: Oropharynx is clear and moist.  Eyes: Conjunctivae are normal. Pupils are equal, round, and reactive to light.  Neck: Normal range of motion. Neck supple.  Cardiovascular: Normal rate, regular rhythm and normal heart sounds.   Pulmonary/Chest:  Tachypneic, no retractions. Dec breath sounds in R side. No wheezing. The chest tube wound is healing well.   Abdominal: Soft. Bowel sounds are normal. She exhibits no distension. There is no tenderness. There is no rebound and no guarding.  Musculoskeletal: Normal range of motion. She exhibits no edema and no tenderness.  Neurological: She is alert and oriented to person, place, and time.  Skin: Skin is warm and dry.  Psychiatric: She has a normal mood and affect. Her behavior is normal. Judgment and thought content normal.    ED Course  Procedures (including critical care time)  CRITICAL CARE Performed by: Silverio Lay, Zaul Hubers   Total critical care time: 30 min   Critical care time was exclusive of  separately billable procedures and treating other patients.  Critical care was necessary to treat or prevent imminent or life-threatening deterioration.  Critical care was time spent personally by me on the following activities: development of treatment plan with patient and/or surrogate as well as nursing, discussions with consultants, evaluation of patient's response to treatment, examination of patient, obtaining history from patient or surrogate, ordering and performing treatments and interventions, ordering and review of laboratory studies, ordering and review of radiographic studies, pulse oximetry and re-evaluation of patient's condition.   Labs Reviewed  CBC - Abnormal; Notable for the following:    RBC 3.79 (*)    Hemoglobin 11.3 (*)    HCT 34.0 (*)    All other components within normal limits  BASIC METABOLIC PANEL - Abnormal; Notable for the following:    Potassium 3.4 (*)    Creatinine, Ser 0.46 (*)    All other components within normal limits  POCT I-STAT 3, BLOOD GAS (G3+) - Abnormal; Notable for the following:    pCO2 arterial 52.8 (*)    pO2, Arterial 228.0 (*)    Bicarbonate 34.6 (*)    Acid-Base Excess 9.0 (*)    All other components within normal limits  POCT I-STAT TROPONIN I   Dg Chest 2 View (if Patient Has Fever And/or Copd)  06/17/2012  *RADIOLOGY REPORT*  Clinical Data: 72 year old female with shortness of breath.  CHEST - 2 VIEW  Comparison: 06/08/2012 and prior chest radiographs.  06/02/2012 CT  Findings: The cardiomediastinal silhouette is unremarkable. COPD/emphysema and small bilateral pleural effusions are again noted. There is no evidence of focal airspace disease, pulmonary edema, suspicious pulmonary nodule/mass, pleural effusion, or pneumothorax. No acute bony abnormalities are identified. Bilateral breast prosthesis again noted.  IMPRESSION: No significant change from 06/08/2012.  COPD/emphysema and small bilateral pleural effusions.   Original Report  Authenticated By: Harmon Pier, M.D.      No diagnosis found.    MDM  Mckenzie Mclaughlin is a 72 y.o. female here with SOB and dec breath sounds on R side. Concerned for possible pneumothorax. Will get CXR and labs and reassess.   9:55 PM CXR showed no pneumothorax. Labs unremarkable. She is still having poor air movement on R lung after nebs and steroids. ABG obtained, pCO2 52, nl pH. Bipap ordered given that she is retaining CO2 after discussion with hospitalist, Dr. Conley Rolls. She will be admitted to step down for severe COPD exacerbation requiring bipap.         Richardean Canal, MD 06/17/12 2156

## 2012-06-18 DIAGNOSIS — I10 Essential (primary) hypertension: Secondary | ICD-10-CM

## 2012-06-18 LAB — CBC
Hemoglobin: 11.3 g/dL — ABNORMAL LOW (ref 12.0–15.0)
MCHC: 33.1 g/dL (ref 30.0–36.0)

## 2012-06-18 LAB — CREATININE, SERUM
Creatinine, Ser: 0.51 mg/dL (ref 0.50–1.10)
GFR calc non Af Amer: >90 mL/min (ref >90)

## 2012-06-18 LAB — TROPONIN I: Troponin I: <0.3 ng/mL (ref <0.30)

## 2012-06-18 MED ORDER — GUAIFENESIN ER 600 MG PO TB12
600.0000 mg | ORAL_TABLET | Freq: Two times a day (BID) | ORAL | Status: DC
  Administered 2012-06-18 – 2012-06-22 (×9): 600 mg via ORAL
  Filled 2012-06-18 (×11): qty 1

## 2012-06-18 MED ORDER — METHYLPREDNISOLONE SODIUM SUCC 125 MG IJ SOLR
60.0000 mg | Freq: Four times a day (QID) | INTRAMUSCULAR | Status: AC
  Administered 2012-06-18 – 2012-06-19 (×6): 60 mg via INTRAVENOUS
  Filled 2012-06-18 (×7): qty 0.96

## 2012-06-18 MED ORDER — CHLORHEXIDINE GLUCONATE 0.12 % MT SOLN
15.0000 mL | Freq: Two times a day (BID) | OROMUCOSAL | Status: DC
  Administered 2012-06-18 – 2012-06-22 (×9): 15 mL via OROMUCOSAL
  Filled 2012-06-18 (×11): qty 15

## 2012-06-18 MED ORDER — BIOTENE DRY MOUTH MT LIQD
15.0000 mL | Freq: Two times a day (BID) | OROMUCOSAL | Status: DC
  Administered 2012-06-18 – 2012-06-21 (×8): 15 mL via OROMUCOSAL

## 2012-06-18 MED ORDER — NITROGLYCERIN 0.4 MG SL SUBL: 0.4000 mg | SUBLINGUAL_TABLET | SUBLINGUAL | Status: DC | PRN

## 2012-06-18 MED ORDER — ENSURE COMPLETE PO LIQD
237.0000 mL | Freq: Two times a day (BID) | ORAL | Status: DC
  Administered 2012-06-18 – 2012-06-22 (×8): 237 mL via ORAL

## 2012-06-18 MED ORDER — OXYCODONE-ACETAMINOPHEN 5-325 MG PO TABS
1.0000 | ORAL_TABLET | Freq: Four times a day (QID) | ORAL | Status: DC | PRN
  Filled 2012-06-18: qty 1

## 2012-06-18 NOTE — Progress Notes (Signed)
[facility] PROGRESS NOTE  Mckenzie Mclaughlin ZOX:[id number] DOB: [birth year] DOA: 06/17/2012 PCP: Crawford Givens, MD  Assessment/Plan: Principal Problem:   COPD with acute exacerbation Active Problems:   HYPERTENSION   COPD   HLD (hyperlipidemia)    1. COPD with acute exacerbation: Patient presented with a 1-day history of progressive SOB, without a productive cough, against a known background of severe COPD.  Managing with iv steroids, Levaquin, now day #2, as well as bronchodilator nebulizer treatments. Feels a little better this AM. CXR was devoid of acute findings. Saturating at 98%-99% on 2L.  2. HTN: Controlled on pre-admission antihypertensives.  3. HLD (hyperlipidemia): On statin, which we have continued.  4. CAD: Patient has known history of CAD with prior NSTEMI, s/p Stent. She currently has no chest pain or clinical evidence of CHF or ACS. Continue ASA/Statin. CEs are unelevated.  5. Recent right Pneumothorax: Patient had a recent pneumothorax after right lung biopsy (granulomatous, not malignant), s/p chest tube placement and subsequent removal. CXR shows no evidence of recurrence and right sided chest tube wound, has healed.    Code Status: Full Code.  Family Communication:  Disposition Plan: To be determined.    Brief narrative: 72 y.o. female with hx of anxiety, severe COPD, HTN, CAD with prior NSTEMI, s/p Stent, recent pneumothorax after right lung biopsy (granulomatous, not malignant), s/p chest tube placement and subsequent removal, presents to the ER with increased shortness of breath for one day. She denied any chest pain, fever, chills, or cough. Evaluation in the ER showed CXR with no pneumothorax nor infiltrate, a WBC of 5.3K with Hb of 11.4. ABG showed 7.43/53/paO2=228/ ?FIO2. She was given IV steroids, nebs, and supplemental oxygen, and admitted fo further management.    Consultants:  N/A.   Procedures:  CXR  Antibiotics:  Levaquin  06/17/12>>>  HPI/Subjective: Feels better this morning.   Objective: Vital signs in last 24 hours: Temp:  [97.8 F (36.6 C)-98.9 F (37.2 C)] 97.8 F (36.6 C) (03/22 0423) Pulse Rate:  [84-106] 102 (03/22 0423) Resp:  [13-18] 16 (03/22 0423) BP: (123-146)/(50-79) 134/79 mmHg (03/22 0423) SpO2:  [98 %-100 %] 99 % (03/22 0603) Weight:  [60 kg (132 lb 4.4 oz)] 60 kg (132 lb 4.4 oz) (03/22 0036) Weight change:  Last BM Date: 06/17/12  Intake/Output from previous day: 03/21 0701 - 03/22 0700 In: 100 [IV Piggyback:100] Out: -      Physical Exam: General: Comfortable, alert, communicative, fully oriented, not short of breath at rest.  HEENT:  Mild clinical pallor, no jaundice, no conjunctival injection or discharge. NECK:  Supple, JVP not seen, no carotid bruits, no palpable lymphadenopathy, no palpable goiter. CHEST:  Clinically clear to auscultation, no wheezes, no crackles. HEART:  Sounds 1 and 2 heard, normal, regular, no murmurs. ABDOMEN:  Flat, soft, non-tender, no palpable organomegaly, no palpable masses, normal bowel sounds. GENITALIA:  Not examined. LOWER EXTREMITIES:  No pitting edema, palpable peripheral pulses. MUSCULOSKELETAL SYSTEM:  Generalized osteoarthritic changes, otherwise, normal. CENTRAL NERVOUS SYSTEM:  No focal neurologic deficit on gross examination.  Lab Results:  Recent Labs  06/17/12 1747 06/18/12 0012  WBC 5.3 3.1*  HGB 11.3* 11.3*  HCT 34.0* 34.1*  PLT 231 204    Recent Labs  06/17/12 1747 06/18/12 0012  NA 140  --   K 3.4*  --   CL 102  --   CO2 32  --   GLUCOSE 99  --   BUN 11  --   CREATININE  0.46* 0.51  CALCIUM 10.1  --    No results found for this or any previous visit (from the past 240 hour(s)).   Studies/Results: Dg Chest 2 View (if Patient Has Fever And/or Copd)  06/17/2012  *RADIOLOGY REPORT*  Clinical Data: 72 year old female with shortness of breath.  CHEST - 2 VIEW  Comparison: 06/08/2012 and prior chest  radiographs.  06/02/2012 CT  Findings: The cardiomediastinal silhouette is unremarkable. COPD/emphysema and small bilateral pleural effusions are again noted. There is no evidence of focal airspace disease, pulmonary edema, suspicious pulmonary nodule/mass, pleural effusion, or pneumothorax. No acute bony abnormalities are identified. Bilateral breast prosthesis again noted.  IMPRESSION: No significant change from 06/08/2012.  COPD/emphysema and small bilateral pleural effusions.   Original Report Authenticated By: Harmon Pier, M.D.     Medications: Scheduled Meds: . albuterol  2.5 mg Nebulization Q6H  . antiseptic oral rinse  15 mL Mouth Rinse q12n4p  . aspirin EC  325 mg Oral Daily  . budesonide-formoterol  2 puff Inhalation Q12H  . chlorhexidine  15 mL Mouth Rinse BID  . [START ON 06/28/2012] cyanocobalamin  1,000 mcg Intramuscular Q8 Weeks  . enoxaparin (LOVENOX) injection  40 mg Subcutaneous Daily  . levofloxacin (LEVAQUIN) IV  500 mg Intravenous QHS  . methylPREDNISolone (SOLU-MEDROL) injection  60 mg Intravenous Q4H  . metoprolol succinate  25 mg Oral q morning - 10a  . simvastatin  10 mg Oral QHS  . sodium chloride  3 mL Intravenous Q12H  . sodium chloride  3 mL Intravenous Q12H  . tiotropium  18 mcg Inhalation QHS   Continuous Infusions:  PRN Meds:.sodium chloride, albuterol, ALPRAZolam, ondansetron (ZOFRAN) IV, ondansetron, sodium chloride    LOS: 1 day   Chari Parmenter,CHRISTOPHER  [facility] Pager [phone]. If 8PM-8AM, please contact night-coverage at [email or web address], password [facility] 06/18/2012, 9:53 AM  LOS: 1 day

## 2012-06-18 NOTE — Progress Notes (Signed)
Patient admitted to 5530 from ED. Patient lives at home with husband. Patient is A&Ox3. Patient has gauze dressing to rt back that is DD&I,patient states that this where she had chest tube removed about 2 weeks ago, site is approximated no redness noted.  Patient is on 2L of O2 and is home dependent on 2L of O2. Oriented patient to room and unit. Explained to patient to call for assistance before getting up and placed on bed alarm. Patient stated understanding. Will continue to monitor patient. Nelda Marseille, RN

## 2012-06-18 NOTE — H&P (Signed)
[facility] History and Physical  ADITRI LOUISCHARLES WUJ:[id number] DOB: 1940/10/12    PCP:   Crawford Givens, MD   Chief Complaint: shortness of breath.  HPI: Mckenzie Mclaughlin is an 72 y.o. female with hx of severe COPD, HTN, CAD with prior MI, recent pneumothorax after right lung biopsy (granulomatous, not malignant), s/p chest tube placement and subsequent removal, presents to the ER with increase shortness of breath for one day.  She denied any chest pain, fever, chills, or cough. Evaluation in the ER showed CXR with no pneumothorax nor infiltrate, a WBC of 5.3K with Hb of 11.4.  ABG showed 7.43/53/paO2=228/ ?FIO2.Marland Kitchen  She was given IV steroids, nebs, and supplemental oxygen, and hospitalist was asked to admit her for COPD exacerbation.  Rewiew of Systems:  Constitutional: Negative for malaise, fever and chills. No significant weight loss or weight gain Eyes: Negative for eye pain, redness and discharge, diplopia, visual changes, or flashes of light. ENMT: Negative for ear pain, hoarseness, nasal congestion, sinus pressure and sore throat. No headaches; tinnitus, drooling, or problem swallowing. Cardiovascular: Negative for chest pain, palpitations, diaphoresis,  peripheral edema. ; No orthopnea, PND Respiratory: Negative for cough, hemoptysis, wheezing and stridor. No pleuritic chestpain. Gastrointestinal: Negative for nausea, vomiting, diarrhea, constipation, abdominal pain, melena, blood in stool, hematemesis, jaundice and rectal bleeding.    Genitourinary: Negative for frequency, dysuria, incontinence,flank pain and hematuria; Musculoskeletal: Negative for back pain and neck pain. Negative for swelling and trauma.;  Skin: . Negative for pruritus, rash, abrasions, bruising and skin lesion.; ulcerations Neuro: Negative for headache, lightheadedness and neck stiffness. Negative for weakness, altered level of consciousness , altered mental status, extremity weakness, burning feet,  involuntary movement, seizure and syncope.  Psych: negative for depression, insomnia, tearfulness, panic attacks, hallucinations, paranoia, suicidal or homicidal ideation    Past Medical History  Diagnosis Date  . COPD, severe 07/29/98  . Coronary artery disease 01/22/99    Non Q-wave MI, stent RCA  . GERD (gastroesophageal reflux disease) 08/29/98    severe  . Hypertension     03/30/00  . History of ETT 08/03/09    Myoview Normal  . History of ETT 05/1999    Cardiolite pos ETT, neg Cardiolite  . Hx of cardiac catheterization 08/29/01    60% RCA 0/w 20-30% lesions  . Hx of cardiac catheterization 01/22/99    w/Stent 90% RCA lesion  . History of ETT 09/02/01    Normal  . History of ETT 04/28/2006    Myoview normal EF 83%  . History of CT scan of head 08/02/09    w/o mild age appropr atrophy  . History of blurry vision 05/06-05/10/2009    Hospital [facility] CP R/O'D Blurry vision, smoking  . History of ETT 04/10/11    normal EF and no ischemia per Dr. Darrold Junker  . COPD (chronic obstructive pulmonary disease)   . Anxiety   . Shortness of breath   . Pneumothorax     Past Surgical History  Procedure Laterality Date  . Laminectomy  1976    Disc Removal X 2 Lumbar  . Mastectomy  03/1976    Bilateral due FCBD with implants [facility])  . Esophageal dilation  06/00    EGD  . Oophorectomy    . Abdominal hysterectomy      Medications:  HOME MEDS: Prior to Admission medications   Medication Sig Start Date End Date Taking? Authorizing Provider  albuterol (PROVENTIL HFA;VENTOLIN HFA) 108 (90 BASE) MCG/ACT inhaler Inhale 2 puffs into  the lungs 4 (four) times daily as needed for wheezing or shortness of breath.   Yes Historical Provider, MD  ALPRAZolam Prudy Feeler) 0.5 MG tablet Take 1 tablet (0.5 mg total) by mouth 3 (three) times daily as needed for sleep. 06/07/12  Yes Donielle Margaretann Loveless, PA-C  aspirin EC 325 MG EC tablet Take 1 tablet (325 mg total) by mouth daily. 06/07/12  Yes Donielle  Margaretann Loveless, PA-C  budesonide-formoterol (SYMBICORT) 160-4.5 MCG/ACT inhaler Inhale 2 puffs into the lungs 2 (two) times daily.   Yes Historical Provider, MD  ciprofloxacin (CIPRO) 250 MG tablet Take 1 tablet (250 mg total) by mouth 2 (two) times daily. For 3 days then stop 06/07/12  Yes Donielle Margaretann Loveless, PA-C  cyanocobalamin (,VITAMIN B-12,) 1000 MCG/ML injection Inject 1,000 mcg into the muscle every 8 (eight) weeks. Pt receives at MD office   Yes Historical Provider, MD  Fluticasone-Salmeterol (ADVAIR) 100-50 MCG/DOSE AEPB Inhale 1 puff into the lungs 2 (two) times daily.   Yes Historical Provider, MD  metoprolol succinate (TOPROL-XL) 25 MG 24 hr tablet Take 25 mg by mouth every morning.  02/15/12  Yes Joaquim Nam, MD  nitroGLYCERIN (NITROSTAT) 0.4 MG SL tablet Place 0.4 mg under the tongue every 5 (five) minutes as needed. For chest pain. 02/24/11  Yes Joaquim Nam, MD  oxyCODONE-acetaminophen (PERCOCET/ROXICET) 5-325 MG per tablet Take 1 tablet by mouth every 6 (six) hours as needed for pain. 06/07/12  Yes Donielle Margaretann Loveless, PA-C  simvastatin (ZOCOR) 10 MG tablet Take 10 mg by mouth at bedtime. 04/01/12  Yes Joaquim Nam, MD  tiotropium (SPIRIVA HANDIHALER) 18 MCG inhalation capsule Place 18 mcg into inhaler and inhale at bedtime.    Yes Historical Provider, MD     Allergies:  Allergies  Allergen Reactions  . Amoxicillin Other (See Comments)    Shortness of breath.  . Sertraline Hcl     REACTION: trembling    Social History:   reports that she quit smoking about 2 months ago. Her smoking use included Cigarettes. She has a 20 pack-year smoking history. She has never used smokeless tobacco. She reports that she does not drink alcohol or use illicit drugs.  Family History: Family History  Problem Relation Age of Onset  . Stroke Mother   . Heart disease Mother     MI  . Cancer Father     Lung  . COPD Brother   . Cancer Brother     Lung with mets 2011  . Heart disease  Brother     CAD  . Heart disease Sister     cirrhosis due heart disease  . Cirrhosis Sister   . Colon cancer Neg Hx      Physical Exam: Filed Vitals:   06/17/12 2300 06/18/12 0036 06/18/12 0136 06/18/12 0423  BP: 123/58 146/56  134/79  Pulse: 106 106  102  Temp:  97.9 F (36.6 C)  97.8 F (36.6 C)  TempSrc:  Oral  Oral  Resp: 16 18  16   Height:  5\' 7"  (1.702 m)    Weight:  60 kg (132 lb 4.4 oz)    SpO2: 98% 98% 98% 99%   Blood pressure 134/79, pulse 102, temperature 97.8 F (36.6 C), temperature source Oral, resp. rate 16, height 5\' 7"  (1.702 m), weight 60 kg (132 lb 4.4 oz), SpO2 99.00%.  GEN:  Pleasant  patient lying in the stretcher in no acute distress; cooperative with exam. PSYCH:  alert and oriented x4;  does not appear anxious or depressed; affect is appropriate. HEENT: Mucous membranes pink and anicteric; PERRLA; EOM intact; no cervical lymphadenopathy nor thyromegaly or carotid bruit; no JVD; There were no stridor. Neck is very supple. Breasts:: Not examined CHEST WALL: No tenderness CHEST: Normal respiration, crackles heard on right base.  Mild to mod wheezing. HEART: Regular rate and rhythm.  There are no murmur, rub, or gallops.   BACK: No kyphosis or scoliosis; no CVA tenderness ABDOMEN: soft and non-tender; no masses, no organomegaly, normal abdominal bowel sounds; no pannus; no intertriginous candida. There is no rebound and no distention. Rectal Exam: Not done EXTREMITIES: No bone or joint deformity; age-appropriate arthropathy of the hands and knees; no edema; no ulcerations.  There is no calf tenderness. Genitalia: not examined PULSES: 2+ and symmetric SKIN: Normal hydration no rash or ulceration CNS: Cranial nerves 2-12 grossly intact no focal lateralizing neurologic deficit.  Speech is fluent; uvula elevated with phonation, facial symmetry and tongue midline. DTR are normal bilaterally, cerebella exam is intact, barbinski is negative and strengths are  equaled bilaterally.  No sensory loss.   Labs on Admission:  Basic Metabolic Panel:  Recent Labs Lab 06/17/12 1747 06/18/12 0012  NA 140  --   K 3.4*  --   CL 102  --   CO2 32  --   GLUCOSE 99  --   BUN 11  --   CREATININE 0.46* 0.51  CALCIUM 10.1  --    Liver Function Tests: No results found for this basename: AST, ALT, ALKPHOS, BILITOT, PROT, ALBUMIN,  in the last 168 hours No results found for this basename: LIPASE, AMYLASE,  in the last 168 hours No results found for this basename: AMMONIA,  in the last 168 hours CBC:  Recent Labs Lab 06/17/12 1747 06/18/12 0012  WBC 5.3 3.1*  HGB 11.3* 11.3*  HCT 34.0* 34.1*  MCV 89.7 89.7  PLT 231 204   Cardiac Enzymes:  Recent Labs Lab 06/18/12 0012  TROPONINI <0.30    CBG: No results found for this basename: GLUCAP,  in the last 168 hours   Radiological Exams on Admission: Dg Chest 2 View (if Patient Has Fever And/or Copd)  06/17/2012  *RADIOLOGY REPORT*  Clinical Data: 72 year old female with shortness of breath.  CHEST - 2 VIEW  Comparison: 06/08/2012 and prior chest radiographs.  06/02/2012 CT  Findings: The cardiomediastinal silhouette is unremarkable. COPD/emphysema and small bilateral pleural effusions are again noted. There is no evidence of focal airspace disease, pulmonary edema, suspicious pulmonary nodule/mass, pleural effusion, or pneumothorax. No acute bony abnormalities are identified. Bilateral breast prosthesis again noted.  IMPRESSION: No significant change from 06/08/2012.  COPD/emphysema and small bilateral pleural effusions.   Original Report Authenticated By: Harmon Pier, M.D.      Assessment/Plan Present on Admission:  . COPD with acute exacerbation . HYPERTENSION . HLD (hyperlipidemia) . COPD  PLAN:  Will admit her for COPD exacerbation.  I will continue IV steroid and add IV Levaquin for inpatient treatment of COPD exacerbation.  She will continue to have nebulizer treatments.  I have continued  all her meds.  She is stable, full code, and will be admitted to [facility] service.  Thank you for allowing me to partake in the care of your nice patient.  Other plans as per orders.  Code Status: FULL Unk Lightning, MD. [facility] Pager [phone] 7pm to 7am.  06/18/2012, 5:20 AM

## 2012-06-18 NOTE — Progress Notes (Signed)
INITIAL NUTRITION ASSESSMENT  DOCUMENTATION CODES Per approved criteria  -Severe malnutrition in the context of chronic illness   INTERVENTION: 1. Ensure Complete with ice cream BID.  2. Encourage adequate intake.   NUTRITION DIAGNOSIS: Inadequate oral intake related to decreased appetite/nausea as evidenced by 50% meal intake.   Goal: Patient will meet >/=90% of estimated nutrition needs  Monitor:  PO intake, weight, labs, i/o's  Reason for Assessment: Malnutrition screening tool, score of 2.   72 y.o. female  Admitting Dx: COPD with acute exacerbation  ASSESSMENT: Patient with a history of COPD. She reports that she has lost about 10-15 pounds (9% UBW) in the last 4 months due to decreased appetite and nausea. She reports a good appetite in the last few weeks with some weight gain noted. She meets the criteria for severe malnutrition in the context of chronic illness.   Height: Ht Readings from Last 1 Encounters:  06/18/12 5\' 7"  (1.702 m)    Weight: Wt Readings from Last 1 Encounters:  06/18/12 132 lb 4.4 oz (60 kg)    Ideal Body Weight: 61.4 kg  % Ideal Body Weight: 98%  Wt Readings from Last 10 Encounters:  06/18/12 132 lb 4.4 oz (60 kg)  06/09/12 125 lb (56.7 kg)  06/09/12 125 lb (56.7 kg)  04/29/12 137 lb (62.143 kg)  03/31/12 137 lb (62.143 kg)  10/26/11 138 lb 12 oz (62.937 kg)  06/09/11 138 lb (62.596 kg)  05/13/11 139 lb 12 oz (63.39 kg)  04/13/11 142 lb (64.411 kg)  02/24/11 141 lb 1.3 oz (63.993 kg)    Usual Body Weight: 65.9 kg  % Usual Body Weight: 91%%  BMI:  Body mass index is 20.71 kg/(m^2). Patient is normal weight.   Estimated Nutritional Needs: Kcal: 1400-1500 kcal Protein: 70-85 g Fluid: >1.8 L  Skin: Incision right flank  Diet Order: Cardiac, 50%  EDUCATION NEEDS: -Education needs addressed   Intake/Output Summary (Last 24 hours) at 06/18/12 1237 Last data filed at 06/18/12 0055  Gross per 24 hour  Intake    100 ml   Output      0 ml  Net    100 ml    Last BM: PTA   Labs:   Recent Labs Lab 06/17/12 1747 06/18/12 0012  NA 140  --   K 3.4*  --   CL 102  --   CO2 32  --   BUN 11  --   CREATININE 0.46* 0.51  CALCIUM 10.1  --   GLUCOSE 99  --     CBG (last 3)  No results found for this basename: GLUCAP,  in the last 72 hours  Scheduled Meds: . albuterol  2.5 mg Nebulization Q6H  . antiseptic oral rinse  15 mL Mouth Rinse q12n4p  . aspirin EC  325 mg Oral Daily  . budesonide-formoterol  2 puff Inhalation Q12H  . chlorhexidine  15 mL Mouth Rinse BID  . [START ON 06/28/2012] cyanocobalamin  1,000 mcg Intramuscular Q8 Weeks  . enoxaparin (LOVENOX) injection  40 mg Subcutaneous Daily  . guaiFENesin  600 mg Oral BID  . levofloxacin (LEVAQUIN) IV  500 mg Intravenous QHS  . methylPREDNISolone (SOLU-MEDROL) injection  60 mg Intravenous Q6H  . metoprolol succinate  25 mg Oral q morning - 10a  . simvastatin  10 mg Oral QHS  . sodium chloride  3 mL Intravenous Q12H  . sodium chloride  3 mL Intravenous Q12H  . tiotropium  18 mcg Inhalation QHS  Continuous Infusions:   Past Medical History  Diagnosis Date  . COPD, severe 07/29/98  . Coronary artery disease 01/22/99    Non Q-wave MI, stent RCA  . GERD (gastroesophageal reflux disease) 08/29/98    severe  . Hypertension     03/30/00  . History of ETT 08/03/09    Myoview Normal  . History of ETT 05/1999    Cardiolite pos ETT, neg Cardiolite  . Hx of cardiac catheterization 08/29/01    60% RCA 0/w 20-30% lesions  . Hx of cardiac catheterization 01/22/99    w/Stent 90% RCA lesion  . History of ETT 09/02/01    Normal  . History of ETT 04/28/2006    Myoview normal EF 83%  . History of CT scan of head 08/02/09    w/o mild age appropr atrophy  . History of blurry vision 05/06-05/10/2009    Hospital [facility] CP R/O'D Blurry vision, smoking  . History of ETT 04/10/11    normal EF and no ischemia per Dr. Darrold Junker  . COPD (chronic obstructive  pulmonary disease)   . Anxiety   . Shortness of breath   . Pneumothorax     Past Surgical History  Procedure Laterality Date  . Laminectomy  1976    Disc Removal X 2 Lumbar  . Mastectomy  03/1976    Bilateral due FCBD with implants [facility])  . Esophageal dilation  06/00    EGD  . Oophorectomy    . Abdominal hysterectomy      Linnell Fulling, RD, LDN Pager #: [phone] After-Hours Pager #: [phone]

## 2012-06-19 LAB — COMPREHENSIVE METABOLIC PANEL
AST: 18 U/L (ref 0–37)
Albumin: 3 g/dL — ABNORMAL LOW (ref 3.5–5.2)
Calcium: 9.9 mg/dL (ref 8.4–10.5)
Creatinine, Ser: 0.57 mg/dL (ref 0.50–1.10)
GFR calc non Af Amer: >90 mL/min (ref >90)
Total Protein: 6.9 g/dL (ref 6.0–8.3)

## 2012-06-19 LAB — CBC
MCH: 29.8 pg (ref 26.0–34.0)
MCHC: 32.8 g/dL (ref 30.0–36.0)
MCV: 90.8 fL (ref 78.0–100.0)
Platelets: 222 10*3/uL (ref 150–400)
RDW: 14 % (ref 11.5–15.5)

## 2012-06-19 MED ORDER — LEVALBUTEROL HCL 0.63 MG/3ML IN NEBU
0.6300 mg | INHALATION_SOLUTION | Freq: Four times a day (QID) | RESPIRATORY_TRACT | Status: DC
  Filled 2012-06-19 (×2): qty 3

## 2012-06-19 MED ORDER — PREDNISONE 20 MG PO TABS
40.0000 mg | ORAL_TABLET | Freq: Every day | ORAL | Status: DC
  Filled 2012-06-19: qty 2

## 2012-06-19 MED ORDER — PREDNISONE 20 MG PO TABS: 40.0000 mg | ORAL_TABLET | Freq: Every day | ORAL | Status: DC

## 2012-06-19 MED ORDER — LEVALBUTEROL HCL 0.63 MG/3ML IN NEBU
0.6300 mg | INHALATION_SOLUTION | RESPIRATORY_TRACT | Status: DC | PRN
  Filled 2012-06-19: qty 3

## 2012-06-19 MED ORDER — PREDNISONE 20 MG PO TABS
40.0000 mg | ORAL_TABLET | Freq: Every day | ORAL | Status: DC
  Administered 2012-06-20 – 2012-06-21 (×2): 40 mg via ORAL
  Filled 2012-06-19 (×3): qty 2

## 2012-06-19 NOTE — Progress Notes (Signed)
[facility] PROGRESS NOTE  Mckenzie Mclaughlin WUJ:[id number] DOB: January 16, 1941 DOA: 06/17/2012 PCP: Crawford Givens, MD  Assessment/Plan: Principal Problem:   COPD with acute exacerbation Active Problems:   HYPERTENSION   COPD   HLD (hyperlipidemia)   HTN (hypertension)    1. COPD with acute exacerbation: Patient presented with a 1-day history of progressive SOB, without a productive cough, against a known background of severe COPD.  Managing with iv steroids, Levaquin, now day #3, as well as bronchodilator nebulizer treatments. CXR was devoid of acute findings. Saturating at 100% on 2L. Patient has significantly improved today. Will start oral steroid taper on 06/20/12.  2. HTN: Controlled on pre-admission antihypertensives.  3. HLD (hyperlipidemia): On statin, which we have continued.  4. CAD: Patient has known history of CAD with prior NSTEMI, s/p Stent. She currently has no chest pain or clinical evidence of CHF or ACS. Continue ASA/Statin. CEs are unelevated.  5. Recent right Pneumothorax: Patient had a recent pneumothorax after right lung biopsy (granulomatous, not malignant), s/p chest tube placement and subsequent removal. CXR shows no evidence of recurrence and right sided chest tube wound, has healed.    Code Status: Full Code.  Family Communication:  Disposition Plan: To be determined.    Brief narrative: 72 y.o. female with hx of anxiety, severe COPD, HTN, CAD with prior NSTEMI, s/p Stent, recent pneumothorax after right lung biopsy (granulomatous, not malignant), s/p chest tube placement and subsequent removal, presents to the ER with increased shortness of breath for one day. She denied any chest pain, fever, chills, or cough. Evaluation in the ER showed CXR with no pneumothorax nor infiltrate, a WBC of 5.3K with Hb of 11.4. ABG showed 7.43/53/paO2=228/ ?FIO2. She was given IV steroids, nebs, and supplemental oxygen, and admitted fo further management.     Consultants:  N/A.   Procedures:  CXR  Antibiotics:  Levaquin 06/17/12>>>  HPI/Subjective: Much better.   Objective: Vital signs in last 24 hours: Temp:  [97.5 F (36.4 C)-98.1 F (36.7 C)] 97.5 F (36.4 C) (03/23 0628) Pulse Rate:  [93-108] 93 (03/23 0628) Resp:  [18-22] 22 (03/23 0628) BP: (123-153)/(51-75) 124/63 mmHg (03/23 0628) SpO2:  [97 %-100 %] 100 % (03/23 0744) Weight change:  Last BM Date: 06/17/12  Intake/Output from previous day: 03/22 0701 - 03/23 0700 In: 118 [P.O.:118] Out: 100 [Urine:100] Total I/O In: -  Out: 200 [Urine:200]   Physical Exam: General: Comfortable in chair, alert, communicative, fully oriented, not short of breath at rest.  HEENT:  Mild clinical pallor, no jaundice, no conjunctival injection or discharge. NECK:  Supple, JVP not seen, no carotid bruits, no palpable lymphadenopathy, no palpable goiter. CHEST:  Clinically clear to auscultation, no wheezes, no crackles. HEART:  Sounds 1 and 2 heard, normal, regular, no murmurs. ABDOMEN:  Flat, soft, non-tender, no palpable organomegaly, no palpable masses, normal bowel sounds. GENITALIA:  Not examined. LOWER EXTREMITIES:  No pitting edema, palpable peripheral pulses. MUSCULOSKELETAL SYSTEM:  Generalized osteoarthritic changes, otherwise, normal. CENTRAL NERVOUS SYSTEM:  No focal neurologic deficit on gross examination.  Lab Results:  Recent Labs  06/18/12 0012 06/19/12 0620  WBC 3.1* 9.7  HGB 11.3* 11.9*  HCT 34.1* 36.3  PLT 204 222    Recent Labs  06/17/12 1747 06/18/12 0012 06/19/12 0620  NA 140  --  145  K 3.4*  --  3.7  CL 102  --  104  CO2 32  --  34*  GLUCOSE 99  --  128*  BUN  11  --  12  CREATININE 0.46* 0.51 0.57  CALCIUM 10.1  --  9.9   No results found for this or any previous visit (from the past 240 hour(s)).   Studies/Results: Dg Chest 2 View (if Patient Has Fever And/or Copd)  06/17/2012  *RADIOLOGY REPORT*  Clinical Data: 72 year old  female with shortness of breath.  CHEST - 2 VIEW  Comparison: 06/08/2012 and prior chest radiographs.  06/02/2012 CT  Findings: The cardiomediastinal silhouette is unremarkable. COPD/emphysema and small bilateral pleural effusions are again noted. There is no evidence of focal airspace disease, pulmonary edema, suspicious pulmonary nodule/mass, pleural effusion, or pneumothorax. No acute bony abnormalities are identified. Bilateral breast prosthesis again noted.  IMPRESSION: No significant change from 06/08/2012.  COPD/emphysema and small bilateral pleural effusions.   Original Report Authenticated By: Harmon Pier, M.D.     Medications: Scheduled Meds: . albuterol  2.5 mg Nebulization Q6H  . antiseptic oral rinse  15 mL Mouth Rinse q12n4p  . aspirin EC  325 mg Oral Daily  . budesonide-formoterol  2 puff Inhalation Q12H  . chlorhexidine  15 mL Mouth Rinse BID  . [START ON 06/28/2012] cyanocobalamin  1,000 mcg Intramuscular Q8 Weeks  . enoxaparin (LOVENOX) injection  40 mg Subcutaneous Daily  . feeding supplement  237 mL Oral BID BM  . guaiFENesin  600 mg Oral BID  . levofloxacin (LEVAQUIN) IV  500 mg Intravenous QHS  . methylPREDNISolone (SOLU-MEDROL) injection  60 mg Intravenous Q6H  . metoprolol succinate  25 mg Oral q morning - 10a  . simvastatin  10 mg Oral QHS  . sodium chloride  3 mL Intravenous Q12H  . sodium chloride  3 mL Intravenous Q12H  . tiotropium  18 mcg Inhalation QHS   Continuous Infusions:  PRN Meds:.sodium chloride, albuterol, ALPRAZolam, nitroGLYCERIN, ondansetron (ZOFRAN) IV, ondansetron, oxyCODONE-acetaminophen, sodium chloride    LOS: 2 days   Vihan Santagata,CHRISTOPHER  [facility] Pager [phone]. If 8PM-8AM, please contact night-coverage at [email or web address], password [facility] 06/19/2012, 12:50 PM  LOS: 2 days

## 2012-06-19 NOTE — Plan of Care (Signed)
Problem: Phase I Progression Outcomes Goal: Tolerating diet Outcome: Progressing Nutritional consult, change to regular diet with ice cream each meal

## 2012-06-20 DIAGNOSIS — I2510 Atherosclerotic heart disease of native coronary artery without angina pectoris: Secondary | ICD-10-CM

## 2012-06-20 LAB — BASIC METABOLIC PANEL
CO2: 32 mEq/L (ref 19–32)
Calcium: 9.7 mg/dL (ref 8.4–10.5)
Glucose, Bld: 126 mg/dL — ABNORMAL HIGH (ref 70–99)
Sodium: 141 mEq/L (ref 135–145)

## 2012-06-20 LAB — CBC
Hemoglobin: 11.6 g/dL — ABNORMAL LOW (ref 12.0–15.0)
MCH: 29.8 pg (ref 26.0–34.0)
MCV: 91 fL (ref 78.0–100.0)
RBC: 3.89 MIL/uL (ref 3.87–5.11)

## 2012-06-20 MED ORDER — LEVALBUTEROL HCL 0.63 MG/3ML IN NEBU
0.6300 mg | INHALATION_SOLUTION | Freq: Four times a day (QID) | RESPIRATORY_TRACT | Status: DC
  Administered 2012-06-20 – 2012-06-21 (×6): 0.63 mg via RESPIRATORY_TRACT
  Filled 2012-06-20 (×10): qty 3

## 2012-06-20 MED ORDER — LORAZEPAM 2 MG/ML IJ SOLN
0.5000 mg | INTRAMUSCULAR | Status: DC | PRN
  Administered 2012-06-20 – 2012-06-22 (×9): 0.5 mg via INTRAVENOUS
  Filled 2012-06-20 (×10): qty 1

## 2012-06-20 NOTE — Care Management Note (Signed)
    Page 1 of 2   06/22/2012     1:21:17 PM   CARE MANAGEMENT NOTE 06/22/2012  Patient:  Mckenzie Mclaughlin, Mckenzie Mclaughlin   Account Number:  [id number]  Date Initiated:  06/20/2012  Documentation initiated by:  Letha Cape  Subjective/Objective Assessment:   dx copd ex  admit- lives with spouse, has home oxygen with lin care and she has a tank in her room.     Action/Plan:   pt eval- hhpt with 24 hr supervision.   Anticipated DC Date:  06/22/2012   Anticipated DC Plan:  HOME W HOME HEALTH SERVICES      DC Planning Services  CM consult      [facility] Choice  HOME HEALTH  Resumption Of Svcs/PTA Provider   Choice offered to / List presented to:  C-1 Patient   DME arranged  Levan Hurst      DME agency  [facility]     [facility] arranged  HH-1 RN  HH-2 PT      [facility] agency  [facility]   Status of service:  Completed, signed off Medicare Important Message given?   (If response is "NO", the following Medicare IM given date fields will be blank) Date Medicare IM given:   Date Additional Medicare IM given:    Discharge Disposition:  HOME W HOME HEALTH SERVICES  Per UR Regulation:  Reviewed for med. necessity/level of care/duration of stay  If discussed at Long Length of Stay Meetings, dates discussed:    Comments:  06/22/12 13:20 Letha Cape RN, BSN [phone] patient dc to home with [facility] services.  [facility] notified.  06/20/12 16:42 Letha Cape RN, BSN [phone] Patient lives with spouse, patient is active with [facility] for [facility], PT, OT, but she does not want the ot, will resume with [facility], PT with [facility] , referral made, Debbie notified. Soc will begin 24-48 hrs post discharge.  Patient for possible dc tomorrow.  Patient has her oxygen tank in the room that she will go home with.  NCM will continue to follow for dc needs.

## 2012-06-20 NOTE — Progress Notes (Signed)
[facility] PROGRESS NOTE  Mckenzie Mclaughlin ZOX:[id number] DOB: 09/26/1940 DOA: 06/17/2012 PCP: Crawford Givens, MD  Assessment/Plan: Principal Problem:   COPD with acute exacerbation Active Problems:   HYPERTENSION   COPD   HLD (hyperlipidemia)   HTN (hypertension)    COPD with acute exacerbation: Patient presented with a 1-day history of progressive SOB, without a productive cough, against a known background of severe COPD.  Managing with iv steroids, Levaquin, now day #4, as well as bronchodilator nebulizer treatments. CXR was devoid of acute findings. Saturating at 100% on 2L. (She is normally on 2L at home) Patient has significantly improved today.  Stared oral steroid taper on 3/24.  If improved 3/25 will likely discharge.  Have requested PT evaluation.   HTN: Controlled on pre-admission antihypertensives.   HLD (hyperlipidemia): On statin, which we have continued.   CAD: Patient has known history of CAD with prior NSTEMI, s/p Stent. She currently has no chest pain or clinical evidence of CHF or ACS. Continue ASA/Statin. CEs are unelevated.   Recent right Pneumothorax: Patient had a recent pneumothorax after right lung biopsy (granulomatous, not malignant), s/p chest tube placement and subsequent removal. CXR shows no evidence of recurrence and right sided chest tube wound, has healed.    Code Status: Full Code.  Family Communication:  Disposition Plan: PT eval pending.  Hopefully home 3/25.   Brief narrative: 72 y.o. female with hx of anxiety, severe COPD, HTN, CAD with prior NSTEMI, s/p Stent, recent pneumothorax after right lung biopsy (granulomatous, not malignant), s/p chest tube placement and subsequent removal, presents to the ER with increased shortness of breath for one day. She denied any chest pain, fever, chills, or cough. Evaluation in the ER showed CXR with no pneumothorax nor infiltrate, a WBC of 5.3K with Hb of 11.4. ABG showed 7.43/53/paO2=228/ ?FIO2. She  was given IV steroids, nebs, and supplemental oxygen, and admitted fo further management.    Consultants:  N/A.   Procedures:  CXR  Antibiotics:  Levaquin 06/17/12>>>  HPI/Subjective: Much better.   Objective: Vital signs in last 24 hours: Temp:  [97.6 F (36.4 C)-98 F (36.7 C)] 97.6 F (36.4 C) (03/24 8119) Pulse Rate:  [105-110] 105 (03/24 0950) Resp:  [20] 20 (03/24 0608) BP: (128-154)/(56-76) 128/76 mmHg (03/24 0950) SpO2:  [91 %-100 %] 97 % (03/24 0852) FiO2 (%):  [28 %] 28 % (03/24 0157) Weight change:  Last BM Date: 06/17/12  Intake/Output from previous day: 03/23 0701 - 03/24 0700 In: -  Out: 300 [Urine:300]     Physical Exam: General: Comfortable in bed, alert, communicative, fully oriented.  Husband at bedside. HEENT:  Mild clinical pallor, no jaundice, no conjunctival injection or discharge. NECK:  Supple, JVP not seen, no carotid bruits, no palpable lymphadenopathy, no palpable goiter. CHEST:  Clear no w/c/r, slightly decreased breath sounds.  (patient reports its uncomfortable to take a deep breath) HEART:  rrr no m/r/g. ABDOMEN:  Flat, soft, non-tender, no palpable organomegaly, no palpable masses, normal bowel sounds. LOWER EXTREMITIES:  No pitting edema, palpable peripheral pulses. CENTRAL NERVOUS SYSTEM:  No focal neurologic deficit on gross examination.  Lab Results:  Recent Labs  06/19/12 0620 06/20/12 0659  WBC 9.7 9.5  HGB 11.9* 11.6*  HCT 36.3 35.4*  PLT 222 223    Recent Labs  06/19/12 0620 06/20/12 0659  NA 145 141  K 3.7 4.3  CL 104 101  CO2 34* 32  GLUCOSE 128* 126*  BUN 12 17  CREATININE 0.57  0.55  CALCIUM 9.9 9.7    Studies/Results: No results found.  Medications: Scheduled Meds: . antiseptic oral rinse  15 mL Mouth Rinse q12n4p  . aspirin EC  325 mg Oral Daily  . budesonide-formoterol  2 puff Inhalation Q12H  . chlorhexidine  15 mL Mouth Rinse BID  . [START ON 06/28/2012] cyanocobalamin  1,000 mcg  Intramuscular Q8 Weeks  . enoxaparin (LOVENOX) injection  40 mg Subcutaneous Daily  . feeding supplement  237 mL Oral BID BM  . guaiFENesin  600 mg Oral BID  . levalbuterol  0.63 mg Nebulization Q6H  . levofloxacin (LEVAQUIN) IV  500 mg Intravenous QHS  . metoprolol succinate  25 mg Oral q morning - 10a  . predniSONE  40 mg Oral Q breakfast  . simvastatin  10 mg Oral QHS  . sodium chloride  3 mL Intravenous Q12H  . sodium chloride  3 mL Intravenous Q12H  . tiotropium  18 mcg Inhalation QHS   Continuous Infusions:  PRN Meds:.sodium chloride, levalbuterol, LORazepam, nitroGLYCERIN, ondansetron (ZOFRAN) IV, ondansetron, oxyCODONE-acetaminophen, sodium chloride    LOS: 3 days   [name], Marianne L, PA-C [facility] Pager [phone]. If 8PM-8AM, please contact night-coverage at [email or web address], password [facility] 06/20/2012, 12:16 PM  LOS: 3 days    Attending Patient seen and examined, agree with the above assessment and plan. She is doing well, not in any distress lying comfortably in bed. Lungs are completely clear to exam. Change IV Solu-Medrol to prednisone, possible discharge in a.m.  S Jovani Flury

## 2012-06-20 NOTE — Evaluation (Signed)
Physical Therapy Evaluation Patient Details Name: Mckenzie Mclaughlin MRN: [id number] DOB: [birth year] Today's Date: 06/20/2012 Time: 1203-1218 PT Time Calculation (min): 15 min  PT Assessment / Plan / Recommendation Clinical Impression  Pt adm with COPD exacerbation.  Pt only home short time after last hospitalization.  Needs skilled PT to maximize I and safety so pt can return home with husband.  Recommend home with family and HHPT.    PT Assessment  Patient needs continued PT services    Follow Up Recommendations  Home health PT;Supervision/Assistance - 24 hour    Does the patient have the potential to tolerate intense rehabilitation      Barriers to Discharge        Equipment Recommendations  Rolling walker with 5" wheels    Recommendations for Other Services     Frequency Min 3X/week    Precautions / Restrictions Precautions Precautions: Fall   Pertinent Vitals/Pain N/A      Mobility  Bed Mobility Bed Mobility: Supine to Sit;Sitting - Scoot to Edge of Bed Supine to Sit: HOB elevated;6: Modified independent (Device/Increase time) Sitting - Scoot to Edge of Bed: 6: Modified independent (Device/Increase time) Transfers Transfers: Sit to Stand;Stand to Sit Sit to Stand: 5: Supervision;With upper extremity assist;From bed Stand to Sit: 5: Supervision;With upper extremity assist;With armrests;To chair/3-in-1 Details for Transfer Assistance: Cues for hand placement Ambulation/Gait Ambulation/Gait Assistance: 4: Min guard Ambulation Distance (Feet): 20 Feet Assistive device: Rolling walker Gait Pattern: Step-through pattern;Decreased stride length Gait velocity: decreased    Exercises     PT Diagnosis: Difficulty walking;Generalized weakness  PT Problem List: Decreased strength;Decreased activity tolerance;Decreased balance;Decreased mobility;Decreased knowledge of use of DME;Cardiopulmonary status limiting activity PT Treatment Interventions: DME instruction;Gait  training;Stair training;Functional mobility training;Therapeutic exercise;Patient/family education   PT Goals Acute Rehab PT Goals PT Goal Formulation: With patient Time For Goal Achievement: 06/27/12 Potential to Achieve Goals: Good Pt will go Sit to Stand: with modified independence PT Goal: Sit to Stand - Progress: Goal set today Pt will Ambulate: 51 - 150 feet;with modified independence;with least restrictive assistive device PT Goal: Ambulate - Progress: Goal set today  Visit Information  Last PT Received On: 06/20/12 Assistance Needed: +1    Subjective Data  Subjective: Pt reports she was doing okay at home. Patient Stated Goal: To go home.   Prior Functioning  Home Living Lives With: Spouse Available Help at Discharge: Family;Available 24 hours/day Type of Home: House Home Access: Stairs to enter [employer] of Steps: 2 Entrance Stairs-Rails: Right;Left Home Layout: One level Bathroom Shower/Tub: Engineer, manufacturing systems: Standard Home Adaptive Equipment: Straight cane Prior Function Level of Independence: Independent with assistive device(s) Vocation: Retired Musician: No difficulties    Copywriter, advertising Overall Cognitive Status: Appears within functional limits for tasks assessed/performed Arousal/Alertness: Awake/alert Orientation Level: Appears intact for tasks assessed Behavior During Session: [facility] for tasks performed    Extremity/Trunk Assessment Right Lower Extremity Assessment RLE ROM/Strength/Tone: Deficits RLE ROM/Strength/Tone Deficits: grossly 4/5 Left Lower Extremity Assessment LLE ROM/Strength/Tone: Deficits LLE ROM/Strength/Tone Deficits: grossly 4/5   Balance Static Standing Balance Static Standing - Balance Support: Bilateral upper extremity supported Static Standing - Level of Assistance: 5: Stand by assistance  End of Session PT - End of Session Equipment Utilized During Treatment:  Oxygen Activity Tolerance: Patient limited by fatigue Patient left: in chair;with call bell/phone within reach;with family/visitor present Nurse Communication: Mobility status  GP     [facility] 06/20/2012, 12:36 PM  [employer] PT [phone]

## 2012-06-20 NOTE — Clinical Documentation Improvement (Signed)
MALNUTRITION DOCUMENTATION CLARIFICATION  THIS DOCUMENT IS NOT A PERMANENT PART OF THE MEDICAL RECORD  TO RESPOND TO THE THIS QUERY, FOLLOW THE INSTRUCTIONS BELOW:  1. If needed, update documentation for the patient's encounter via the notes activity.  2. Access this query again and click edit on the In [employer].  3. After updating, or not, click F2 to complete all highlighted (required) fields concerning your review. Select "additional documentation in the medical record" OR "no additional documentation provided".  4. Click Sign note button.  5. The deficiency will fall out of your In Basket *Please let us know if you are not able to complete this workflow by phone or e-mail (listed below).  Please update your documentation within the medical record to reflect your response to this query.                                                                                        06/20/12   Dear Dr. [facility],  In a better effort to capture your patient's severity of illness, reflect appropriate length of stay and utilization of resources, a review of the patient medical record has revealed the following indicators.    Based on your clinical judgment, please clarify and document in a progress note and/or discharge summary the clinical condition associated with the following supporting information:  In responding to this query please exercise your independent judgment.  The fact that a query is asked, does not imply that any particular answer is desired or expected.   Per nutritionist note 3/22 pt meeting criteria for "Severe malnutrition in the context of chronic illness" " 9% under body weight" " with loss of 10-15 lbs over  4 months"  .  Please document the clinical significance of review if appropriate.  Thank you  Possible Clinical Conditions?  Severe Malnutrition    Protein Calorie Malnutrition  Severe Protein Calorie Malnutrition  Other Condition  Cannot  clinically determine      Risk Factors: Copd exac O2 dependent , age     Treatment   Nutrition Consult:  INTERVENTION:  1. Ensure Complete with ice cream BID.  2. Encourage adequate intake.     You may use possible, probable, or suspect with inpatient documentation. possible, probable, suspected diagnoses MUST be documented at the time of discharge  Reviewed: additional documentation in the medical record Severe malnutrition added  Thank You,  Lavonda Jumbo  Clinical Documentation Specialist, RN, BSN: Pager [phone]  Health Information Management [place]

## 2012-06-21 ENCOUNTER — Inpatient Hospital Stay (HOSPITAL_COMMUNITY): Payer: Medicare Other

## 2012-06-21 MED ORDER — LEVALBUTEROL HCL 0.63 MG/3ML IN NEBU
0.6300 mg | INHALATION_SOLUTION | Freq: Three times a day (TID) | RESPIRATORY_TRACT | Status: DC
  Administered 2012-06-21: 0.63 mg via RESPIRATORY_TRACT
  Filled 2012-06-21 (×3): qty 3

## 2012-06-21 MED ORDER — LEVALBUTEROL HCL 0.63 MG/3ML IN NEBU
0.6300 mg | INHALATION_SOLUTION | RESPIRATORY_TRACT | Status: DC | PRN
  Administered 2012-06-22: 0.63 mg via RESPIRATORY_TRACT
  Filled 2012-06-21: qty 3

## 2012-06-21 MED ORDER — LEVOFLOXACIN 500 MG PO TABS
500.0000 mg | ORAL_TABLET | Freq: Every day | ORAL | Status: DC
  Administered 2012-06-21 – 2012-06-22 (×2): 500 mg via ORAL
  Filled 2012-06-21 (×2): qty 1

## 2012-06-21 MED ORDER — ALBUTEROL SULFATE HFA 108 (90 BASE) MCG/ACT IN AERS
2.0000 | INHALATION_SPRAY | Freq: Four times a day (QID) | RESPIRATORY_TRACT | Status: DC | PRN
  Filled 2012-06-21: qty 6.7

## 2012-06-21 MED ORDER — PREDNISONE 20 MG PO TABS
20.0000 mg | ORAL_TABLET | Freq: Every day | ORAL | Status: DC
  Administered 2012-06-22: 20 mg via ORAL
  Filled 2012-06-21 (×2): qty 1

## 2012-06-21 MED ORDER — LEVALBUTEROL HCL 0.63 MG/3ML IN NEBU
0.6300 mg | INHALATION_SOLUTION | Freq: Three times a day (TID) | RESPIRATORY_TRACT | Status: DC
  Administered 2012-06-21 – 2012-06-22 (×2): 0.63 mg via RESPIRATORY_TRACT
  Filled 2012-06-21 (×5): qty 3

## 2012-06-21 MED ORDER — IPRATROPIUM BROMIDE 0.02 % IN SOLN
0.5000 mg | Freq: Three times a day (TID) | RESPIRATORY_TRACT | Status: DC
  Administered 2012-06-21: 0.5 mg via RESPIRATORY_TRACT
  Filled 2012-06-21: qty 2.5

## 2012-06-21 NOTE — Progress Notes (Signed)
Pt would like to keep her outdated iv since she may be discharged tomorrow. Pt states "If I don't go home tomorrow, they can just give me another one tomorrow"

## 2012-06-21 NOTE — Progress Notes (Signed)
Agree with intern note  Clarene Duke RD, LDN Pager [phone] After Hours pager [phone]

## 2012-06-21 NOTE — Progress Notes (Signed)
[facility] PROGRESS NOTE  Mckenzie Mclaughlin RUE:[id number] DOB: 1941/01/09 DOA: 06/17/2012 PCP: Crawford Givens, MD  Assessment/Plan: Principal Problem:   COPD with acute exacerbation Active Problems:   HYPERTENSION   COPD   HLD (hyperlipidemia)   HTN (hypertension)   COPD with acute exacerbation:  Patient presented with a 1-day history of progressive SOB, without a productive cough, against a known background of severe COPD.  Managed with with IV steroids ( changed to prednisone), IV Levaquin, (now changed to PO), as well as bronchodilator nebulizer treatments. CXR was devoid of acute findings. Saturating at 100% on 2L. (She is normally on 2L at home) Patient has significantly improved, but very anxious about going home.  She is very frail and feels she is not ready and will decompensate.  Repeat cxr shows no new findings.  Will ambulate patient today and plan for d/c tomorrow.  Home health RN and PT have been arranged.  HTN: Controlled on pre-admission antihypertensives.   HLD (hyperlipidemia): On statin, which we have continued.   CAD: Patient has known history of CAD with prior NSTEMI, s/p Stent. She currently has no chest pain or clinical evidence of CHF or ACS. Continue ASA/Statin. CEs are unelevated.   Recent right Pneumothorax: Patient had a recent pneumothorax after right lung biopsy (granulomatous, not malignant), s/p chest tube placement and subsequent removal. CXR shows no evidence of recurrence and right sided chest tube wound, has healed.   DVT Prophylaxis:  Lovenox   Code Status: Full Code.  Family Communication:  Disposition Plan:  Home with home health services 3/26   Brief narrative: 72 y.o. female with hx of anxiety, severe COPD, HTN, CAD with prior NSTEMI, s/p Stent, recent pneumothorax after right lung biopsy (granulomatous, not malignant), s/p chest tube placement and subsequent removal, presents to the ER with increased shortness of breath for one day. She  denied any chest pain, fever, chills, or cough. Evaluation in the ER showed CXR with no pneumothorax nor infiltrate, a WBC of 5.3K with Hb of 11.4. ABG showed 7.43/53/paO2=228/ ?FIO2. She was given IV steroids, nebs, and supplemental oxygen, and admitted fo further management.    Consultants:  N/A.   Procedures:  CXR  Antibiotics:  Levaquin 06/17/12>>>  HPI/Subjective: Complaining of being unable to take a deep breath.  Concerned her lung has collapsed again.  Objective: Vital signs in last 24 hours: Temp:  [97.4 F (36.3 C)-98.3 F (36.8 C)] 98.3 F (36.8 C) (03/25 0420) Pulse Rate:  [88-93] 90 (03/25 0420) Resp:  [18-20] 20 (03/25 0420) BP: (123-136)/(73-75) 123/75 mmHg (03/25 0420) SpO2:  [96 %-100 %] 96 % (03/25 0927) Weight change:  Last BM Date: 06/20/12  Intake/Output from previous day: 03/24 0701 - 03/25 0700 In: -  Out: 1 [Stool:1] Total I/O In: 240 [P.O.:240] Out: -    Physical Exam: General: A&O, Appears frail, elderly, ill.  NAD HEENT:  Mild clinical pallor, no jaundice, no conjunctival injection or discharge. NECK:  Supple, JVP not seen, no carotid bruits, no palpable lymphadenopathy, no palpable goiter. CHEST:  Clear no w/c/r, slightly decreased breath sounds.  (patient reports its uncomfortable to take a deep breath) HEART:  rrr no m/r/g. ABDOMEN:  Flat, soft, non-tender, no palpable organomegaly, no palpable masses, normal bowel sounds. LOWER EXTREMITIES:  No pitting edema, palpable peripheral pulses.   Lab Results:  Recent Labs  06/19/12 0620 06/20/12 0659  WBC 9.7 9.5  HGB 11.9* 11.6*  HCT 36.3 35.4*  PLT 222 223    Recent  Labs  06/19/12 0620 06/20/12 0659  NA 145 141  K 3.7 4.3  CL 104 101  CO2 34* 32  GLUCOSE 128* 126*  BUN 12 17  CREATININE 0.57 0.55  CALCIUM 9.9 9.7    Studies/Results: Dg Chest Port 1 View  06/21/2012  *RADIOLOGY REPORT*  Clinical Data: Short of breath  PORTABLE CHEST - 1 VIEW  Comparison: 06/17/2012   Findings:  Heart size is normal.  The lungs appear hyperinflated and there are coarsened interstitial markings identified bilaterally.  Pleural and parenchymal scarring noted at the right base with blunting of the right costophrenic angle.  This is similar to previous exam.  IMPRESSION:  1.  No acute cardiopulmonary abnormalities. 2.  COPD/emphysema.   Original Report Authenticated By: Signa Kell, M.D.     Medications: Scheduled Meds: . antiseptic oral rinse  15 mL Mouth Rinse q12n4p  . aspirin EC  325 mg Oral Daily  . budesonide-formoterol  2 puff Inhalation Q12H  . chlorhexidine  15 mL Mouth Rinse BID  . [START ON 06/28/2012] cyanocobalamin  1,000 mcg Intramuscular Q8 Weeks  . enoxaparin (LOVENOX) injection  40 mg Subcutaneous Daily  . feeding supplement  237 mL Oral BID BM  . guaiFENesin  600 mg Oral BID  . levofloxacin  500 mg Oral Daily  . metoprolol succinate  25 mg Oral q morning - 10a  . [START ON 06/22/2012] predniSONE  20 mg Oral Q breakfast  . simvastatin  10 mg Oral QHS  . sodium chloride  3 mL Intravenous Q12H  . sodium chloride  3 mL Intravenous Q12H  . tiotropium  18 mcg Inhalation QHS   Continuous Infusions:  PRN Meds:.sodium chloride, albuterol, levalbuterol, LORazepam, nitroGLYCERIN, ondansetron (ZOFRAN) IV, ondansetron, oxyCODONE-acetaminophen, sodium chloride    LOS: 4 days   [name], Marianne L, PA-C [facility] Pager [phone]. If 8PM-8AM, please contact night-coverage at [email or web address], password [facility] 06/21/2012, 10:51 AM  LOS: 4 days    Attending Patient seen and examined, agree with the assessment and plan. Lungs are clear to auscultation, CXR repeat does not show significant abnormalities. Will continue with nebs/steroids/levaquin. Check BNP. Should be stable for discharge tomorrow.  S Gerlad Pelzel

## 2012-06-21 NOTE — Progress Notes (Signed)
NUTRITION FOLLOW UP  Intervention:   Continue Ensure Complete BID which provides 700 kcal and 26 grams of protein  Nutrition Dx:   Inadequate oral intake related to decreased appetite/nausea as evidenced by 50% meal intake, Ongoing  Goal:   Patient will meet >/=90% of estimated nutrition needs, Progressing  Monitor:   PO's, weight trends, I/O's, labs  Assessment:   Pt with hx of COPD. Patient had a recent pneumothorax after right lung biopsy (granulomatous, not malignant).   Pt reports ongoing poor appetite. She states that she really likes Ensure and has been drinking them, especially when she does not eat much at meal-time. Continue Ensure Complete BID.  Pt Per physician note, will ambulate pt today and plan for d/c tomorrow.   Height: Ht Readings from Last 1 Encounters:  06/18/12 5\' 7"  (1.702 m)    Weight Status:   Wt Readings from Last 1 Encounters:  06/18/12 132 lb 4.4 oz (60 kg)    Estimated needs:  Kcal: 1400-1500 Protein: 70-85 grams Fluid: >/=1.8 L/day  Skin: Incision right flank  Diet Order: General   Intake/Output Summary (Last 24 hours) at 06/21/12 1450 Last data filed at 06/21/12 1300  Gross per 24 hour  Intake    440 ml  Output      0 ml  Net    440 ml    Last BM: PTA   Labs:   Recent Labs Lab 06/17/12 1747 06/18/12 0012 06/19/12 0620 06/20/12 0659  NA 140  --  145 141  K 3.4*  --  3.7 4.3  CL 102  --  104 101  CO2 32  --  34* 32  BUN 11  --  12 17  CREATININE 0.46* 0.51 0.57 0.55  CALCIUM 10.1  --  9.9 9.7  GLUCOSE 99  --  128* 126*    CBG (last 3)  No results found for this basename: GLUCAP,  in the last 72 hours  Scheduled Meds: . antiseptic oral rinse  15 mL Mouth Rinse q12n4p  . aspirin EC  325 mg Oral Daily  . budesonide-formoterol  2 puff Inhalation Q12H  . chlorhexidine  15 mL Mouth Rinse BID  . [START ON 06/28/2012] cyanocobalamin  1,000 mcg Intramuscular Q8 Weeks  . enoxaparin (LOVENOX) injection  40 mg Subcutaneous  Daily  . feeding supplement  237 mL Oral BID BM  . guaiFENesin  600 mg Oral BID  . ipratropium  0.5 mg Nebulization Q8H  . levalbuterol  0.63 mg Nebulization Q8H  . levofloxacin  500 mg Oral Daily  . metoprolol succinate  25 mg Oral q morning - 10a  . [START ON 06/22/2012] predniSONE  20 mg Oral Q breakfast  . simvastatin  10 mg Oral QHS  . sodium chloride  3 mL Intravenous Q12H  . sodium chloride  3 mL Intravenous Q12H    Continuous Infusions:    Trenton Gammon Dietetic Intern # [phone]

## 2012-06-21 NOTE — Progress Notes (Signed)
PT Cancellation Note  Patient Details Name: Mckenzie Mclaughlin MRN: [id number] DOB: [birth year]   Cancelled Treatment:    Reason Eval/Treat Not Completed: Other (comment) (Pt reports she feels too short of breath and fatigued.)   Xandra Laramee 06/21/2012, 2:52 PM

## 2012-06-22 DIAGNOSIS — E43 Unspecified severe protein-calorie malnutrition: Secondary | ICD-10-CM | POA: Diagnosis present

## 2012-06-22 DIAGNOSIS — F411 Generalized anxiety disorder: Secondary | ICD-10-CM

## 2012-06-22 DIAGNOSIS — E41 Nutritional marasmus: Secondary | ICD-10-CM | POA: Insufficient documentation

## 2012-06-22 MED ORDER — CLONAZEPAM 0.5 MG PO TABS: 0.2500 mg | ORAL_TABLET | Freq: Two times a day (BID) | ORAL | Status: DC | PRN

## 2012-06-22 MED ORDER — PREDNISONE 20 MG PO TABS: 20.0000 mg | ORAL_TABLET | Freq: Every day | ORAL | Status: DC

## 2012-06-22 MED ORDER — LORAZEPAM 0.5 MG PO TABS
0.5000 mg | ORAL_TABLET | Freq: Once | ORAL | Status: AC
  Administered 2012-06-22: 0.5 mg via ORAL
  Filled 2012-06-22: qty 1

## 2012-06-22 MED ORDER — IPRATROPIUM-ALBUTEROL 0.5-2.5 (3) MG/3ML IN SOLN: 3.0000 mL | Freq: Four times a day (QID) | RESPIRATORY_TRACT | Status: DC | PRN

## 2012-06-22 NOTE — Discharge Summary (Signed)
Physician Discharge Summary  Mckenzie Mclaughlin EAV:[id number] DOB: 1940-06-21 DOA: 06/17/2012  PCP: Crawford Givens, MD  Admit date: 06/17/2012 Discharge date: 06/22/2012  Time spent: 40 minutes  Recommendations for Outpatient Follow-up:    Discharge Diagnoses:  Principal Problem:   COPD with acute exacerbation Active Problems:   HYPERTENSION   COPD   HLD (hyperlipidemia)   HTN (hypertension) Anxiety  Discharge Condition: Frail but stable, breathing comfortably on 2 L  Diet recommendation: Heart healthy with Ensure or boost supplementation  Filed Weights   06/18/12 0036  Weight: 60 kg (132 lb 4.4 oz)    History of present illness:   is an 72 y.o. female with hx of severe COPD, HTN, CAD with prior MI, recent pneumothorax after right lung biopsy (granulomatous, not malignant), s/p chest tube placement and subsequent removal, presents to the ER with increase shortness of breath for one day. She denied any chest pain, fever, chills, or cough. Evaluation in the ER showed CXR with no pneumothorax nor infiltrate, a WBC of 5.3K with Hb of 11.4. ABG showed 7.43/53/paO2=228/ ?FIO2.Marland Kitchen She was given IV steroids, nebs, and supplemental oxygen, and hospitalist was asked to admit her for COPD exacerbation.   Hospital Course:   COPD with acute exacerbation:  Patient presented with a 1-day history of progressive SOB, without a productive cough, against a known background of severe COPD. her symptoms were managed with with IV steroids ( changed to prednisone), IV Levaquin, (now changed to PO), as well as bronchodilator nebulizer treatments. CXR was devoid of acute findings. Saturating at 100% on 2L. (She is normally on 2L at home).  And there for the course of several days she improved significantly. On March 25 the patient was due for discharge but was feeling very weak and described recurrent symptoms of shortness of breath.  Repeat cxr showed no new findings. It was felt that a  significant component of anxiety contributed to her symptoms. She was discharged to home in stable condition on March 26. Home health RN and physical therapy were ordered.   HTN: Controlled on pre-admission antihypertensives.   HLD (hyperlipidemia): On statin, which we have continued.   CAD: Patient has known history of CAD with prior NSTEMI, s/p Stent. She currently has no chest pain or clinical evidence of CHF or ACS. Continue ASA/Statin. CEs are unelevated.   Recent right Pneumothorax: Patient had a recent pneumothorax after right lung biopsy (granulomatous, not malignant), s/p chest tube placement and subsequent removal. CXR shows no evidence of recurrence and right sided chest tube wound, has healed.   Anxiety The patient has had significant health-related issues recently and appears to have ongoing anxiety from these issues. She was on Xanax 3 times a day and the hospital, but complained that it was not being given to her frequently enough. She was changed to Ativan every 4 hours when necessary. At the time of discharge she requested a prescription for benzodiazepines. She was given a prescription for Klonopin twice a day, she was concerned that twice a day would not be frequently enough. We counseled the patient regarding the half life of Clonopin in contrast to the half life of Ativan. At the time of discharge the patient was still expressing concerns over her anxiety. She specifically requested IV Ativan to be given just before discharge, which we politely refused. The patient and her husband were clearly told not to take Clonopin more than twice a day and not to use her previous prescription for Xanax while  she was taking Klonopin.     Discharge Exam: Filed Vitals:   06/21/12 2034 06/22/12 0532 06/22/12 0635 06/22/12 0808  BP: 153/77 134/76    Pulse: 87 84    Temp: 98.2 F (36.8 C) 97.7 F (36.5 C)    TempSrc: Oral Oral    Resp: 20 18 18    Height:      Weight:      SpO2: 98%  100%  99%    General: Thin, elderly female, appears chronically ill. Smiling in no apparent distress Cardiovascular: Regular rate and rhythm no obvious murmurs rubs or gallops Respiratory: Clear to auscultation without wheezes crackles or rales, the patient does not take deep inspiration Abdomen: thin, soft, nontender, nondistended, positive bowel sounds Psychiatric: Alert and oriented, no apparent distress, cooperative, appears slightly anxious.  Discharge Instructions   Future Appointments Provider Department Dept Phone   06/30/2012 9:30 AM Delight Ovens, MD [facility] [phone]       Medication List    STOP taking these medications       ALPRAZolam 0.5 MG tablet  Commonly known as:  XANAX     ciprofloxacin 250 MG tablet  Commonly known as:  CIPRO      TAKE these medications       albuterol 108 (90 BASE) MCG/ACT inhaler  Commonly known as:  PROVENTIL HFA;VENTOLIN HFA  Inhale 2 puffs into the lungs 4 (four) times daily as needed for wheezing or shortness of breath.     aspirin 325 MG EC tablet  Take 1 tablet (325 mg total) by mouth daily.     budesonide-formoterol 160-4.5 MCG/ACT inhaler  Commonly known as:  SYMBICORT  Inhale 2 puffs into the lungs 2 (two) times daily.     clonazePAM 0.5 MG tablet  Commonly known as:  KLONOPIN  Take 0.5 tablets (0.25 mg total) by mouth 2 (two) times daily as needed for anxiety.     cyanocobalamin 1000 MCG/ML injection  Commonly known as:  (VITAMIN B-12)  Inject 1,000 mcg into the muscle every 8 (eight) weeks. Pt receives at MD office     Fluticasone-Salmeterol 100-50 MCG/DOSE Aepb  Commonly known as:  ADVAIR  Inhale 1 puff into the lungs 2 (two) times daily.     ipratropium-albuterol 0.5-2.5 (3) MG/3ML Soln  Commonly known as:  DUONEB  Take 3 mLs by nebulization every 6 (six) hours as needed. Use three times a day until breathing improves, then use as needed.     metoprolol  succinate 25 MG 24 hr tablet  Commonly known as:  TOPROL-XL  Take 25 mg by mouth every morning.     nitroGLYCERIN 0.4 MG SL tablet  Commonly known as:  NITROSTAT  Place 0.4 mg under the tongue every 5 (five) minutes as needed. For chest pain.     oxyCODONE-acetaminophen 5-325 MG per tablet  Commonly known as:  PERCOCET/ROXICET  Take 1 tablet by mouth every 6 (six) hours as needed for pain.     predniSONE 20 MG tablet  Commonly known as:  DELTASONE  Take 1 tablet (20 mg total) by mouth daily with breakfast. Take 1 pill each morning for 2 days, then take 1/2 pill each morning for 4 days.     simvastatin 10 MG tablet  Commonly known as:  ZOCOR  Take 10 mg by mouth at bedtime.     SPIRIVA HANDIHALER 18 MCG inhalation capsule  Generic drug:  tiotropium  Place 18 mcg into inhaler and  inhale at bedtime.           Follow-up Information   Follow up with Crawford Givens, MD. Schedule an appointment as soon as possible for a visit in 2 weeks.   Contact information:   [address]       Please follow up. (See Dr. Mayo Ao, Pulmonologist on Friday 3/28 as previously scheduled.)        The results of significant diagnostics from this hospitalization (including imaging, microbiology, ancillary and laboratory) are listed below for reference.    Significant Diagnostic Studies: Dg Chest 2 View (if Patient Has Fever And/or Copd)  06/17/2012  *RADIOLOGY REPORT*  Clinical Data: 72 year old female with shortness of breath.  CHEST - 2 VIEW  Comparison: 06/08/2012 and prior chest radiographs.  06/02/2012 CT  Findings: The cardiomediastinal silhouette is unremarkable. COPD/emphysema and small bilateral pleural effusions are again noted. There is no evidence of focal airspace disease, pulmonary edema, suspicious pulmonary nodule/mass, pleural effusion, or pneumothorax. No acute bony abnormalities are identified. Bilateral breast prosthesis again noted.  IMPRESSION: No significant change  from 06/08/2012.  COPD/emphysema and small bilateral pleural effusions.   Original Report Authenticated By: Harmon Pier, M.D.    Dg Chest 2 View  Dg Chest [place] 1 View  06/21/2012  *RADIOLOGY REPORT*  Clinical Data: Short of breath  PORTABLE CHEST - 1 VIEW  Comparison: 06/17/2012  Findings:  Heart size is normal.  The lungs appear hyperinflated and there are coarsened interstitial markings identified bilaterally.  Pleural and parenchymal scarring noted at the right base with blunting of the right costophrenic angle.  This is similar to previous exam.  IMPRESSION:  1.  No acute cardiopulmonary abnormalities. 2.  COPD/emphysema.   Original Report Authenticated By: Signa Kell, M.D.    Labs: Basic Metabolic Panel:  Recent Labs Lab 06/17/12 1747 06/18/12 0012 06/19/12 0620 06/20/12 0659  NA 140  --  145 141  K 3.4*  --  3.7 4.3  CL 102  --  104 101  CO2 32  --  34* 32  GLUCOSE 99  --  128* 126*  BUN 11  --  12 17  CREATININE 0.46* 0.51 0.57 0.55  CALCIUM 10.1  --  9.9 9.7   Liver Function Tests:  Recent Labs Lab 06/19/12 0620  AST 18  ALT 14  ALKPHOS 69  BILITOT 0.3  PROT 6.9  ALBUMIN 3.0*   CBC:  Recent Labs Lab 06/17/12 1747 06/18/12 0012 06/19/12 0620 06/20/12 0659  WBC 5.3 3.1* 9.7 9.5  HGB 11.3* 11.3* 11.9* 11.6*  HCT 34.0* 34.1* 36.3 35.4*  MCV 89.7 89.7 90.8 91.0  PLT 231 204 222 223   Cardiac Enzymes:  Recent Labs Lab 06/18/12 0012 06/18/12 0445 06/18/12 1120  TROPONINI <0.30 <0.30 <0.30   BNP: BNP (last 3 results)  Recent Labs  06/21/12 1045  PROBNP 1829.0*    Signed:  Stephani Police [phone]  [facility] 06/22/2012, 10:23 AM   Attending Patient seen and examined, agree with the above assessment and plan. Patient is doing significantly better, her lungs are clear. Her chest x-ray from yesterday the nausea major abnormalities. I feel that she has significant anxiety component and is asking for benzodiazepines on discharge.  She thinks that Ativan helps her a lot, I have asked her not to take Xanax while she is on Klonopin which she we will prescribe on discharge. She has a pre-existing appointment with her primary pulmonologist-Dr. Mayo Ao this coming Friday which I have asked  her to keep. She is otherwise stable to discharge home, she has chronic COPD and is on oxygen at home. On discharge she seems stable, she was not using any accessory muscles she was awake and alert and did claim that she was much more better than the past few days. It is felt that she is currently very close to her usual baseline, it is felt that she should be stable enough to continue with tapering steroids, and bronchodilators in the outpatient setting with close followup with up primary pulmonologist.  Windell Norfolk

## 2012-06-22 NOTE — Progress Notes (Signed)
Physical Therapy Treatment Patient Details Name: Mckenzie Mclaughlin MRN: [id number] DOB: [birth year] Today's Date: 06/22/2012 Time: 0900-0929 PT Time Calculation (min): 29 min  PT Assessment / Plan / Recommendation Comments on Treatment Session  Pt is slowly improving. she continues with generalized weakness and decreased acitivty tolerance. She will benefit from continued PT with HHPT at home    Follow Up Recommendations  Home health PT     Does the patient have the potential to tolerate intense rehabilitation     Barriers to Discharge        Equipment Recommendations  None recommended by PT (pt states she has a RW at home)    Recommendations for Other Services    Frequency     Plan Discharge plan remains appropriate;Frequency remains appropriate    Precautions / Restrictions Restrictions Weight Bearing Restrictions: No   Pertinent Vitals/Pain No pain, pt with dyspnea on exertion.  O2 sats > 95% on 2l O2 throughout    Mobility  Bed Mobility Bed Mobility: Supine to Sit;Sitting - Scoot to Edge of Bed Supine to Sit: HOB elevated;6: Modified independent (Device/Increase time) Sitting - Scoot to Edge of Bed: 6: Modified independent (Device/Increase time) Transfers Transfers: Sit to Stand;Stand to Sit Sit to Stand: 5: Supervision;With upper extremity assist;From bed Stand to Sit: 5: Supervision;With upper extremity assist;With armrests;To chair/3-in-1 Details for Transfer Assistance: pt has uncontrolled descent to sitting.  Pt ecouraged to control speed to activate quads Ambulation/Gait Ambulation/Gait Assistance: 4: Min guard Ambulation Distance (Feet): 50 Feet Assistive device: Rolling walker Ambulation/Gait Assistance Details: pt encouraged to extend trunk and contol breathing Gait Pattern: Step-through pattern;Decreased stride length Gait velocity: pt self selects very slow speed to control dyspnea General Gait Details: pt appears steady with RW Stairs:  (verbally  reviewed.  Pt anticipates no problems) Wheelchair Mobility Wheelchair Mobility: No    Exercises General Exercises - Lower Extremity Ankle Circles/Pumps: AROM;Both;5 reps;Seated Gluteal Sets: AROM;Both;5 reps;Standing Hip Flexion/Marching: AROM;Both;5 reps;Seated (emphasis on trunk extension in sitting) Other Exercises Other Exercises: repeated sit to stand.  pt only able to repeat 3 times due to dyspnea and fatigue.  Pt/husband instructed to do one repetition every hour while awake awith controlled descent to strengthen legs and increase activity tolerance   PT Diagnosis:    PT Problem List:   PT Treatment Interventions:     PT Goals Acute Rehab PT Goals PT Goal: Supine/Side to Sit - Progress: Progressing toward goal PT Goal: Sit to Stand - Progress: Progressing toward goal PT Goal: Ambulate - Progress: Progressing toward goal PT Goal: Up/Down Stairs - Progress: Met (pt verbally reports she will be able to manage steps)  Visit Information  Last PT Received On: 06/22/12    Subjective Data  Subjective: it scares me when I can't breathe deep Patient Stated Goal:  to go home today   Cognition  Cognition Overall Cognitive Status: Appears within functional limits for tasks assessed/performed Arousal/Alertness: Awake/alert Orientation Level: Appears intact for tasks assessed Behavior During Session: [facility] for tasks performed    Balance  Static Sitting Balance Static Sitting - Balance Support: No upper extremity supported Static Sitting - Level of Assistance: 7: Independent Static Sitting - Comment/# of Minutes: pt with trunk extension and bilateral UE hip to hip to activate core muscles Static Standing Balance Static Standing - Balance Support: Bilateral upper extremity supported Static Standing - Level of Assistance: 6: Modified independent (Device/Increase time) Static Standing - Comment/# of Minutes: ecnouraged glute and core activation with standing  End  of Session PT - End of  Session Equipment Utilized During Treatment: Oxygen Activity Tolerance: Patient limited by fatigue Patient left: with family/visitor present;with call bell/phone within reach   GP   Rosey Bath K. [place], [employer] [phone]  06/22/2012, 9:34 AM

## 2012-06-22 NOTE — Progress Notes (Signed)
Mckenzie Mclaughlin discharged Home per MD order.  Discharge instructions reviewed and discussed with the patient, all questions and concerns answered. Copy of instructions and scripts given to patient.    Medication List    STOP taking these medications       ALPRAZolam 0.5 MG tablet  Commonly known as:  XANAX     budesonide-formoterol 160-4.5 MCG/ACT inhaler  Commonly known as:  SYMBICORT     ciprofloxacin 250 MG tablet  Commonly known as:  CIPRO      TAKE these medications       albuterol 108 (90 BASE) MCG/ACT inhaler  Commonly known as:  PROVENTIL HFA;VENTOLIN HFA  Inhale 2 puffs into the lungs 4 (four) times daily as needed for wheezing or shortness of breath.     aspirin 325 MG EC tablet  Take 1 tablet (325 mg total) by mouth daily.     clonazePAM 0.5 MG tablet  Commonly known as:  KLONOPIN  Take 0.5 tablets (0.25 mg total) by mouth 2 (two) times daily as needed for anxiety.     cyanocobalamin 1000 MCG/ML injection  Commonly known as:  (VITAMIN B-12)  Inject 1,000 mcg into the muscle every 8 (eight) weeks. Pt receives at MD office     Fluticasone-Salmeterol 100-50 MCG/DOSE Aepb  Commonly known as:  ADVAIR  Inhale 1 puff into the lungs 2 (two) times daily.     ipratropium-albuterol 0.5-2.5 (3) MG/3ML Soln  Commonly known as:  DUONEB  Take 3 mLs by nebulization every 6 (six) hours as needed. Use three times a day until breathing improves, then use as needed.     metoprolol succinate 25 MG 24 hr tablet  Commonly known as:  TOPROL-XL  Take 25 mg by mouth every morning.     nitroGLYCERIN 0.4 MG SL tablet  Commonly known as:  NITROSTAT  Place 0.4 mg under the tongue every 5 (five) minutes as needed. For chest pain.     oxyCODONE-acetaminophen 5-325 MG per tablet  Commonly known as:  PERCOCET/ROXICET  Take 1 tablet by mouth every 6 (six) hours as needed for pain.     predniSONE 20 MG tablet  Commonly known as:  DELTASONE  Take 1 tablet (20 mg total) by mouth daily  with breakfast. Take 1 pill each morning for 2 days, then take 1/2 pill each morning for 4 days.     simvastatin 10 MG tablet  Commonly known as:  ZOCOR  Take 10 mg by mouth at bedtime.     SPIRIVA HANDIHALER 18 MCG inhalation capsule  Generic drug:  tiotropium  Place 18 mcg into inhaler and inhale at bedtime.        Patients skin is clean, dry and intact, no evidence of skin break down. IV site discontinued and catheter remains intact. Site without signs and symptoms of complications. Dressing and pressure applied.  Patient escorted to car by NT in a wheelchair with O2,  no distress noted upon discharge.  [name], [name] 06/22/2012 12:59 PM

## 2012-06-23 ENCOUNTER — Emergency Department (HOSPITAL_COMMUNITY): Payer: Medicare Other

## 2012-06-23 ENCOUNTER — Encounter (HOSPITAL_COMMUNITY): Payer: Self-pay | Admitting: *Deleted

## 2012-06-23 ENCOUNTER — Emergency Department (HOSPITAL_COMMUNITY)
Admission: EM | Admit: 2012-06-23 | Discharge: 2012-06-23 | Disposition: A | Discharge status: Home / Self Care | Payer: Medicare Other | Attending: Emergency Medicine | Admitting: Emergency Medicine

## 2012-06-23 DIAGNOSIS — Z9861 Coronary angioplasty status: Secondary | ICD-10-CM | POA: Insufficient documentation

## 2012-06-23 DIAGNOSIS — F419 Anxiety disorder, unspecified: Secondary | ICD-10-CM

## 2012-06-23 DIAGNOSIS — Z9189 Other specified personal risk factors, not elsewhere classified: Secondary | ICD-10-CM | POA: Insufficient documentation

## 2012-06-23 DIAGNOSIS — Z79899 Other long term (current) drug therapy: Secondary | ICD-10-CM | POA: Insufficient documentation

## 2012-06-23 DIAGNOSIS — R061 Stridor: Secondary | ICD-10-CM | POA: Insufficient documentation

## 2012-06-23 DIAGNOSIS — J4489 Other specified chronic obstructive pulmonary disease: Secondary | ICD-10-CM | POA: Insufficient documentation

## 2012-06-23 DIAGNOSIS — K219 Gastro-esophageal reflux disease without esophagitis: Secondary | ICD-10-CM | POA: Insufficient documentation

## 2012-06-23 DIAGNOSIS — Z8709 Personal history of other diseases of the respiratory system: Secondary | ICD-10-CM | POA: Insufficient documentation

## 2012-06-23 DIAGNOSIS — IMO0002 Reserved for concepts with insufficient information to code with codable children: Secondary | ICD-10-CM | POA: Insufficient documentation

## 2012-06-23 DIAGNOSIS — I2510 Atherosclerotic heart disease of native coronary artery without angina pectoris: Secondary | ICD-10-CM | POA: Insufficient documentation

## 2012-06-23 DIAGNOSIS — I10 Essential (primary) hypertension: Secondary | ICD-10-CM | POA: Insufficient documentation

## 2012-06-23 DIAGNOSIS — J449 Chronic obstructive pulmonary disease, unspecified: Secondary | ICD-10-CM | POA: Insufficient documentation

## 2012-06-23 DIAGNOSIS — Z87891 Personal history of nicotine dependence: Secondary | ICD-10-CM | POA: Insufficient documentation

## 2012-06-23 DIAGNOSIS — F411 Generalized anxiety disorder: Secondary | ICD-10-CM | POA: Insufficient documentation

## 2012-06-23 LAB — CBC WITH DIFFERENTIAL/PLATELET
HCT: 38.7 % (ref 36.0–46.0)
Hemoglobin: 12.8 g/dL (ref 12.0–15.0)
Lymphocytes Relative: 15 % (ref 12–46)
Lymphs Abs: 1 10*3/uL (ref 0.7–4.0)
MCHC: 33.1 g/dL (ref 30.0–36.0)
Monocytes Absolute: 0.5 10*3/uL (ref 0.1–1.0)
Monocytes Relative: 7 % (ref 3–12)
Neutro Abs: 5.2 10*3/uL (ref 1.7–7.7)
RBC: 4.33 MIL/uL (ref 3.87–5.11)
WBC: 6.7 10*3/uL (ref 4.0–10.5)

## 2012-06-23 LAB — COMPREHENSIVE METABOLIC PANEL
Alkaline Phosphatase: 55 U/L (ref 39–117)
BUN: 18 mg/dL (ref 6–23)
CO2: 30 mEq/L (ref 19–32)
Chloride: 98 mEq/L (ref 96–112)
Creatinine, Ser: 0.55 mg/dL (ref 0.50–1.10)
GFR calc non Af Amer: >90 mL/min (ref >90)
Glucose, Bld: 116 mg/dL — ABNORMAL HIGH (ref 70–99)
Total Bilirubin: 0.4 mg/dL (ref 0.3–1.2)

## 2012-06-23 MED ORDER — LORAZEPAM 2 MG/ML IJ SOLN
1.0000 mg | Freq: Once | INTRAMUSCULAR | Status: AC
  Administered 2012-06-23: 1 mg via INTRAVENOUS
  Filled 2012-06-23: qty 1

## 2012-06-23 MED ORDER — LORAZEPAM 1 MG PO TABS: 1.0000 mg | ORAL_TABLET | Freq: Three times a day (TID) | ORAL | Status: DC | PRN

## 2012-06-23 NOTE — ED Notes (Signed)
Was just d/c yesterday  From hospital and this am she states she had sob is on home o2 at  2 lnc

## 2012-06-23 NOTE — ED Notes (Signed)
All ed notes per C Kasin Tonkinson RN. Pt just d/c from hospital yesterday for same pt states sob got worse this am

## 2012-06-23 NOTE — ED Provider Notes (Signed)
History     CSN: [id number]  Arrival date & time 06/23/12  1357   First MD Initiated Contact with Patient 06/23/12 1551      Chief Complaint  Patient presents with  . Shortness of Breath     HPI Patient recently hospitalized history COPD anxiety and a pneumothorax.  She feels jittery inside and feels palpitations.  Denies chest pain.  They did stop her Xanax the hospital and switched her over to clonazepam. Past Medical History  Diagnosis Date  . COPD, severe 07/29/98  . Coronary artery disease 01/22/99    Non Q-wave MI, stent RCA  . GERD (gastroesophageal reflux disease) 08/29/98    severe  . Hypertension     03/30/00  . History of ETT 08/03/09    Myoview Normal  . History of ETT 05/1999    Cardiolite pos ETT, neg Cardiolite  . Hx of cardiac catheterization 08/29/01    60% RCA 0/w 20-30% lesions  . Hx of cardiac catheterization 01/22/99    w/Stent 90% RCA lesion  . History of ETT 09/02/01    Normal  . History of ETT 04/28/2006    Myoview normal EF 83%  . History of CT scan of head 08/02/09    w/o mild age appropr atrophy  . History of blurry vision 05/06-05/10/2009    Hospital [facility] CP R/O'D Blurry vision, smoking  . History of ETT 04/10/11    normal EF and no ischemia per Dr. Darrold Junker  . COPD (chronic obstructive pulmonary disease)   . Anxiety   . Shortness of breath   . Pneumothorax     Past Surgical History  Procedure Laterality Date  . Laminectomy  1976    Disc Removal X 2 Lumbar  . Mastectomy  03/1976    Bilateral due FCBD with implants [facility])  . Esophageal dilation  06/00    EGD  . Oophorectomy    . Abdominal hysterectomy      Family History  Problem Relation Age of Onset  . Stroke Mother   . Heart disease Mother     MI  . Cancer Father     Lung  . COPD Brother   . Cancer Brother     Lung with mets 2011  . Heart disease Brother     CAD  . Heart disease Sister     cirrhosis due heart disease  . Cirrhosis Sister   . Colon cancer Neg Hx      History  Substance Use Topics  . Smoking status: Former Smoker -- 0.40 packs/day for 50 years    Types: Cigarettes    Quit date: 04/10/2012  . Smokeless tobacco: Never Used     Comment: 1/4 PPD as of 2012  . Alcohol Use: No    OB History   Grav Para Term Preterm Abortions TAB SAB Ect Mult Living                  Review of Systems  HENT: Negative for congestion.   Respiratory: Positive for stridor. Negative for cough and chest tightness.     Allergies  Amoxicillin and Sertraline hcl  Home Medications   Current Outpatient Rx  Name  Route  Sig  Dispense  Refill  . albuterol (PROVENTIL HFA;VENTOLIN HFA) 108 (90 BASE) MCG/ACT inhaler   Inhalation   Inhale 2 puffs into the lungs 4 (four) times daily as needed for wheezing or shortness of breath.         Marland Kitchen aspirin EC  325 MG EC tablet   Oral   Take 1 tablet (325 mg total) by mouth daily.   30 tablet      . budesonide-formoterol (SYMBICORT) 160-4.5 MCG/ACT inhaler   Inhalation   Inhale 2 puffs into the lungs 2 (two) times daily.         . cyanocobalamin (,VITAMIN B-12,) 1000 MCG/ML injection   Intramuscular   Inject 1,000 mcg into the muscle every 8 (eight) weeks. Pt receives at MD office         . Fluticasone-Salmeterol (ADVAIR) 100-50 MCG/DOSE AEPB   Inhalation   Inhale 1 puff into the lungs 2 (two) times daily.         Marland Kitchen ipratropium-albuterol (DUONEB) 0.5-2.5 (3) MG/3ML SOLN   Nebulization   Take 3 mLs by nebulization every 6 (six) hours as needed. Use three times a day until breathing improves, then use as needed.   360 mL   1     This Rx should be for the individual nebulizer via ...   . metoprolol succinate (TOPROL-XL) 25 MG 24 hr tablet   Oral   Take 25 mg by mouth every morning.          . nitroGLYCERIN (NITROSTAT) 0.4 MG SL tablet   Sublingual   Place 0.4 mg under the tongue every 5 (five) minutes as needed. For chest pain.         Marland Kitchen oxyCODONE-acetaminophen (PERCOCET/ROXICET) 5-325  MG per tablet   Oral   Take 1 tablet by mouth every 6 (six) hours as needed for pain.   30 tablet   0   . predniSONE (DELTASONE) 20 MG tablet   Oral   Take 1 tablet (20 mg total) by mouth daily with breakfast. Take 1 pill each morning for 2 days, then take 1/2 pill each morning for 4 days.   4 tablet   0   . simvastatin (ZOCOR) 10 MG tablet   Oral   Take 10 mg by mouth at bedtime.         Marland Kitchen tiotropium (SPIRIVA HANDIHALER) 18 MCG inhalation capsule   Inhalation   Place 18 mcg into inhaler and inhale at bedtime.          Marland Kitchen LORazepam (ATIVAN) 1 MG tablet   Oral   Take 1 tablet (1 mg total) by mouth 3 (three) times daily as needed for anxiety.   30 tablet   0     BP 134/70  Pulse 78  Temp(Src) 97.8 F (36.6 C) (Oral)  Resp 20  SpO2 96%  Physical Exam  Nursing note and vitals reviewed. Constitutional: She is oriented to person, place, and time. She appears well-developed and well-nourished. No distress.  HENT:  Head: Normocephalic and atraumatic.  Eyes: Pupils are equal, round, and reactive to light.  Neck: Normal range of motion.  Cardiovascular: Normal rate and intact distal pulses.   Pulmonary/Chest: Effort normal and breath sounds normal. No respiratory distress.  Abdominal: Normal appearance. She exhibits no distension. There is no tenderness.  Musculoskeletal: Normal range of motion.  Neurological: She is alert and oriented to person, place, and time. No cranial nerve deficit.  Skin: Skin is warm and dry. No rash noted.  Psychiatric: Her behavior is normal. Her mood appears anxious.    ED Course  Procedures (including critical care time) Meds ordered this encounter  Medications  . budesonide-formoterol (SYMBICORT) 160-4.5 MCG/ACT inhaler    Sig: Inhale 2 puffs into the lungs 2 (two) times daily.  Marland Kitchen  LORazepam (ATIVAN) injection 1 mg    Sig:   . LORazepam (ATIVAN) 1 MG tablet    Sig: Take 1 tablet (1 mg total) by mouth 3 (three) times daily as needed for  anxiety.    Dispense:  30 tablet    Refill:  0    Labs Reviewed  CBC WITH DIFFERENTIAL - Abnormal; Notable for the following:    Neutrophils Relative 78 (*)    All other components within normal limits  COMPREHENSIVE METABOLIC PANEL - Abnormal; Notable for the following:    Glucose, Bld 116 (*)    Albumin 3.1 (*)    All other components within normal limits   Dg Chest 2 View (if Patient Has Fever And/or Copd)  06/23/2012  *RADIOLOGY REPORT*  Clinical Data: Short of breath  CHEST - 2 VIEW  Comparison: 06/21/2012  Findings: Stable right pleural effusion and basilar atelectasis. Tiny left pleural effusion.  Hyperaeration.  Calcified granulomata are noted.  No new focal consolidation or mass.  Normal heart size. No pneumothorax.  IMPRESSION: Small bilateral pleural effusions.  Chronic changes.   Original Report Authenticated By: Jolaine Click, M.D.      1. Anxiety       MDM   Patient's feels better after the Ativan.  Nurse her observation emerged apartment her O2 sats remained in the mid 90s on room air.  I don't think at this time she needs any oxygen at home unless she develops worsening shortness of breath.      Nelia Shi, MD 06/23/12 [phone]

## 2012-06-27 DIAGNOSIS — I10 Essential (primary) hypertension: Secondary | ICD-10-CM

## 2012-06-28 ENCOUNTER — Other Ambulatory Visit: Payer: Self-pay | Admitting: *Deleted

## 2012-06-28 DIAGNOSIS — I2510 Atherosclerotic heart disease of native coronary artery without angina pectoris: Secondary | ICD-10-CM

## 2012-06-30 ENCOUNTER — Encounter: Payer: Self-pay | Admitting: Cardiothoracic Surgery

## 2012-06-30 ENCOUNTER — Ambulatory Visit (INDEPENDENT_AMBULATORY_CARE_PROVIDER_SITE_OTHER): Payer: Medicare Other | Admitting: Cardiothoracic Surgery

## 2012-06-30 ENCOUNTER — Other Ambulatory Visit: Payer: Self-pay | Admitting: *Deleted

## 2012-06-30 ENCOUNTER — Ambulatory Visit
Admission: RE | Admit: 2012-06-30 | Discharge: 2012-06-30 | Disposition: A | Discharge status: Home / Self Care | Payer: Medicare Other | Source: Ambulatory Visit | Attending: Cardiothoracic Surgery | Admitting: Cardiothoracic Surgery

## 2012-06-30 VITALS — BP 150/72 | HR 88 | Resp 20 | Ht 67.0 in | Wt 132.0 lb

## 2012-06-30 DIAGNOSIS — I2510 Atherosclerotic heart disease of native coronary artery without angina pectoris: Secondary | ICD-10-CM

## 2012-06-30 DIAGNOSIS — J9383 Other pneumothorax: Secondary | ICD-10-CM

## 2012-06-30 DIAGNOSIS — J939 Pneumothorax, unspecified: Secondary | ICD-10-CM

## 2012-06-30 NOTE — Progress Notes (Signed)
[address]            [place] [address]          [phone]       QUINTASHA GREN [facility] Medical Record #[id number] Date of Birth: 17-May-1940  Hulda Marin, MD Crawford Givens, MD  Chief Complaint:   PostOp Follow Up Visit Followup after prolonged air leak multiple chest tubes and right VATS done at [place].  History of Present Illness:     Patient remains short of breath with minimal exertion and continues to use oxygen at home. Since she was discharged home after removal of chest tubes she's had one readmission for COPD exacerbation, but without evidence of recurrent pneumothorax.  Or prolonged pneumothorax was related to a needle biopsy done for lung lesion, ultimately the biopsy was negative for malignancy.        History  Smoking status  . Former Smoker -- 0.40 packs/day for 50 years  . Types: Cigarettes  . Quit date: 04/10/2012  Smokeless tobacco  . Never Used    Comment: 1/4 PPD as of 2012       Allergies  Allergen Reactions  . Amoxicillin Other (See Comments)    Shortness of breath.  . Sertraline Hcl     REACTION: trembling    Current Outpatient Prescriptions  Medication Sig Dispense Refill  . albuterol (PROVENTIL HFA;VENTOLIN HFA) 108 (90 BASE) MCG/ACT inhaler Inhale 2 puffs into the lungs 4 (four) times daily as needed for wheezing or shortness of breath.      Marland Kitchen aspirin EC 325 MG EC tablet Take 1 tablet (325 mg total) by mouth daily.  30 tablet    . cyanocobalamin (,VITAMIN B-12,) 1000 MCG/ML injection Inject 1,000 mcg into the muscle every 8 (eight) weeks. Pt receives at MD office      . LORazepam (ATIVAN) 1 MG tablet Take 1 tablet (1 mg total) by mouth 3 (three) times daily as needed for anxiety.  30 tablet  0  . metoprolol succinate (TOPROL-XL) 25 MG 24 hr tablet Take 25 mg by mouth every morning.       . nitroGLYCERIN (NITROSTAT) 0.4 MG SL tablet Place 0.4 mg under the tongue every 5 (five) minutes as  needed. For chest pain.      . simvastatin (ZOCOR) 10 MG tablet Take 10 mg by mouth at bedtime.      Marland Kitchen tiotropium (SPIRIVA HANDIHALER) 18 MCG inhalation capsule Place 18 mcg into inhaler and inhale at bedtime.       . [DISCONTINUED] clonazePAM (KLONOPIN) 0.5 MG tablet Take 0.5 tablets (0.25 mg total) by mouth 2 (two) times daily as needed for anxiety.  30 tablet  0   No current facility-administered medications for this visit.       Physical Exam: BP 150/72  Pulse 88  Resp 20  Ht 5\' 7"  (1.702 m)  Wt 132 lb (59.875 kg)  BMI 20.67 kg/m2  SpO2 98%  General appearance: alert, cooperative, appears older than stated age and mild distress Neurologic: intact Heart: regular rate and rhythm, S1, S2 normal, no murmur, click, rub or gallop and normal apical impulse Lungs: diminished breath sounds bilaterally Abdomen: soft, non-tender; bowel sounds normal; no masses,  no organomegaly Extremities: extremities normal, atraumatic, no cyanosis or edema and Homans sign is negative, no sign of DVT Wound: Her right chest tube sites are all well-healed  Diagnostic Studies & Laboratory data:         Recent Radiology Findings: Dg Chest 2 View  06/30/2012  *RADIOLOGY REPORT*  Clinical Data: Coronary artery disease. Shortness of breath  CHEST - 2 VIEW  Comparison:  June 23, 2012  Findings: There is underlying emphysema.  There is scarring in the right base.  There may be minimal pleural effusion on the right, stable.  There is no edema or consolidation.  No apparent pneumothorax. Heart size is normal.  The pulmonary vascular reflects underlying emphysema and is stable.  No adenopathy.  There are no appreciable bone lesions.  IMPRESSION: Underlying emphysema.  Right base scarring with questionable minimal right effusion.  No edema or consolidation.   Original Report Authenticated By: Bretta Bang, M.D.       Recent Labs: Lab Results  Component Value Date   WBC 6.7 06/23/2012   HGB 12.8 06/23/2012     HCT 38.7 06/23/2012   PLT 208 06/23/2012   GLUCOSE 116* 06/23/2012   CHOL 124 02/18/2011   TRIG 75.0 02/18/2011   HDL 52.20 02/18/2011   LDLCALC 57 02/18/2011   ALT 21 06/23/2012   AST 24 06/23/2012   NA 136 06/23/2012   K 4.0 06/23/2012   CL 98 06/23/2012   CREATININE 0.55 06/23/2012   BUN 18 06/23/2012   CO2 30 06/23/2012   TSH 1.069 06/18/2012   INR 1.04 06/02/2012      Assessment / Plan:     Patient with severe underlying pulmonary disease still symptomatic, but without evidence of recurrent pneumothorax following a prolonged period of air leak secondary to needle biopsy of the lung. Reviewed the patient's chest x-ray with her. At this point continued aggressive pulmonary care is indicated. I have not made a return appointment to see me but would be glad to at any time if she has recurrent pneumothorax or other surgical problem and Dr. Reita Cliche request.   Sheliah Plane B 06/30/2012 6:30 PM

## 2012-07-06 ENCOUNTER — Ambulatory Visit (INDEPENDENT_AMBULATORY_CARE_PROVIDER_SITE_OTHER): Payer: Medicare Other | Admitting: Family Medicine

## 2012-07-06 ENCOUNTER — Encounter: Payer: Self-pay | Admitting: Family Medicine

## 2012-07-06 VITALS — BP 128/70 | HR 82 | Temp 97.7°F | Wt 124.0 lb

## 2012-07-06 DIAGNOSIS — M76899 Other specified enthesopathies of unspecified lower limb, excluding foot: Secondary | ICD-10-CM

## 2012-07-06 DIAGNOSIS — E538 Deficiency of other specified B group vitamins: Secondary | ICD-10-CM

## 2012-07-06 DIAGNOSIS — E43 Unspecified severe protein-calorie malnutrition: Secondary | ICD-10-CM

## 2012-07-06 DIAGNOSIS — M7062 Trochanteric bursitis, left hip: Secondary | ICD-10-CM | POA: Insufficient documentation

## 2012-07-06 DIAGNOSIS — J449 Chronic obstructive pulmonary disease, unspecified: Secondary | ICD-10-CM

## 2012-07-06 DIAGNOSIS — J4489 Other specified chronic obstructive pulmonary disease: Secondary | ICD-10-CM

## 2012-07-06 DIAGNOSIS — E41 Nutritional marasmus: Secondary | ICD-10-CM

## 2012-07-06 MED ORDER — CYANOCOBALAMIN 1000 MCG/ML IJ SOLN
1000.0000 ug | Freq: Once | INTRAMUSCULAR | Status: AC
  Administered 2012-07-06: 1000 ug via INTRAMUSCULAR

## 2012-07-06 NOTE — Assessment & Plan Note (Signed)
Likely positional.  Ice and f/u prn.

## 2012-07-06 NOTE — Assessment & Plan Note (Signed)
Add ice cream to ensure and this should help some. I wouldn't use megace at this point.  D/w pt.

## 2012-07-06 NOTE — Patient Instructions (Addendum)
I would try mixing the ensure with ice cream.  Call pulmonary about the inhalers.  I would ice your left hip for the pain.  Repeat B12 in 8 weeks.  The scab on the lower back should gradually heal up.  Take care.   Recheck in 2 months with Para March.

## 2012-07-06 NOTE — Assessment & Plan Note (Signed)
Injection done today.

## 2012-07-06 NOTE — Progress Notes (Signed)
Prev notes reviewed. H/o lung mass. PTX after biopsy- no CA on bx.  Now with chest tube removed. Still with dec in appetite.  Saw pulmonary - Dr. Meredeth Ide- today.  Still with some SOB.  She feels weak.  No fevers.  No sputum. Some cough, rare sputum. She has IS to use, is compliant. She is not doing well with ensure- we talked about mixing this with ice cream.  She doesn't complain of pain except for 1 spot on her back, where she apparently had a chest tube.   Due for B12 shot.    L hip pain noted, no trauma. Plain laying on L side. Has been on L side frequently due to R sided chest tubes.    PMH and SH reviewed  ROS: See HPI, otherwise noncontributory.  Meds, vitals, and allergies reviewed.   nad ncat Thin appearing Mmm rrr Ctab, no wheeze, no inc in wob.  Back with healing scab at prev skin incision. Doesn't appear infected L greater troch ttp.  Ext w/o edema

## 2012-07-06 NOTE — Assessment & Plan Note (Signed)
She'll call about f/u with pulm.  Continue IS.  Her tube sites appear to be healing well.

## 2012-07-08 IMAGING — CR DG CHEST 1V PORT
1 series · 2 of 2 positions shown · non-contrast
Comparison: none

REASON FOR EXAM: Chest Pain
COMMENTS:

PROCEDURE:     DXR - DXR PORTABLE CHEST SINGLE VIEW  - March 28, 2011  [DATE]
RESULT:     Comparison: None

[Series 1: portable · 0.17mm/px · 2 of 2 slices shown]
[im 1/2]
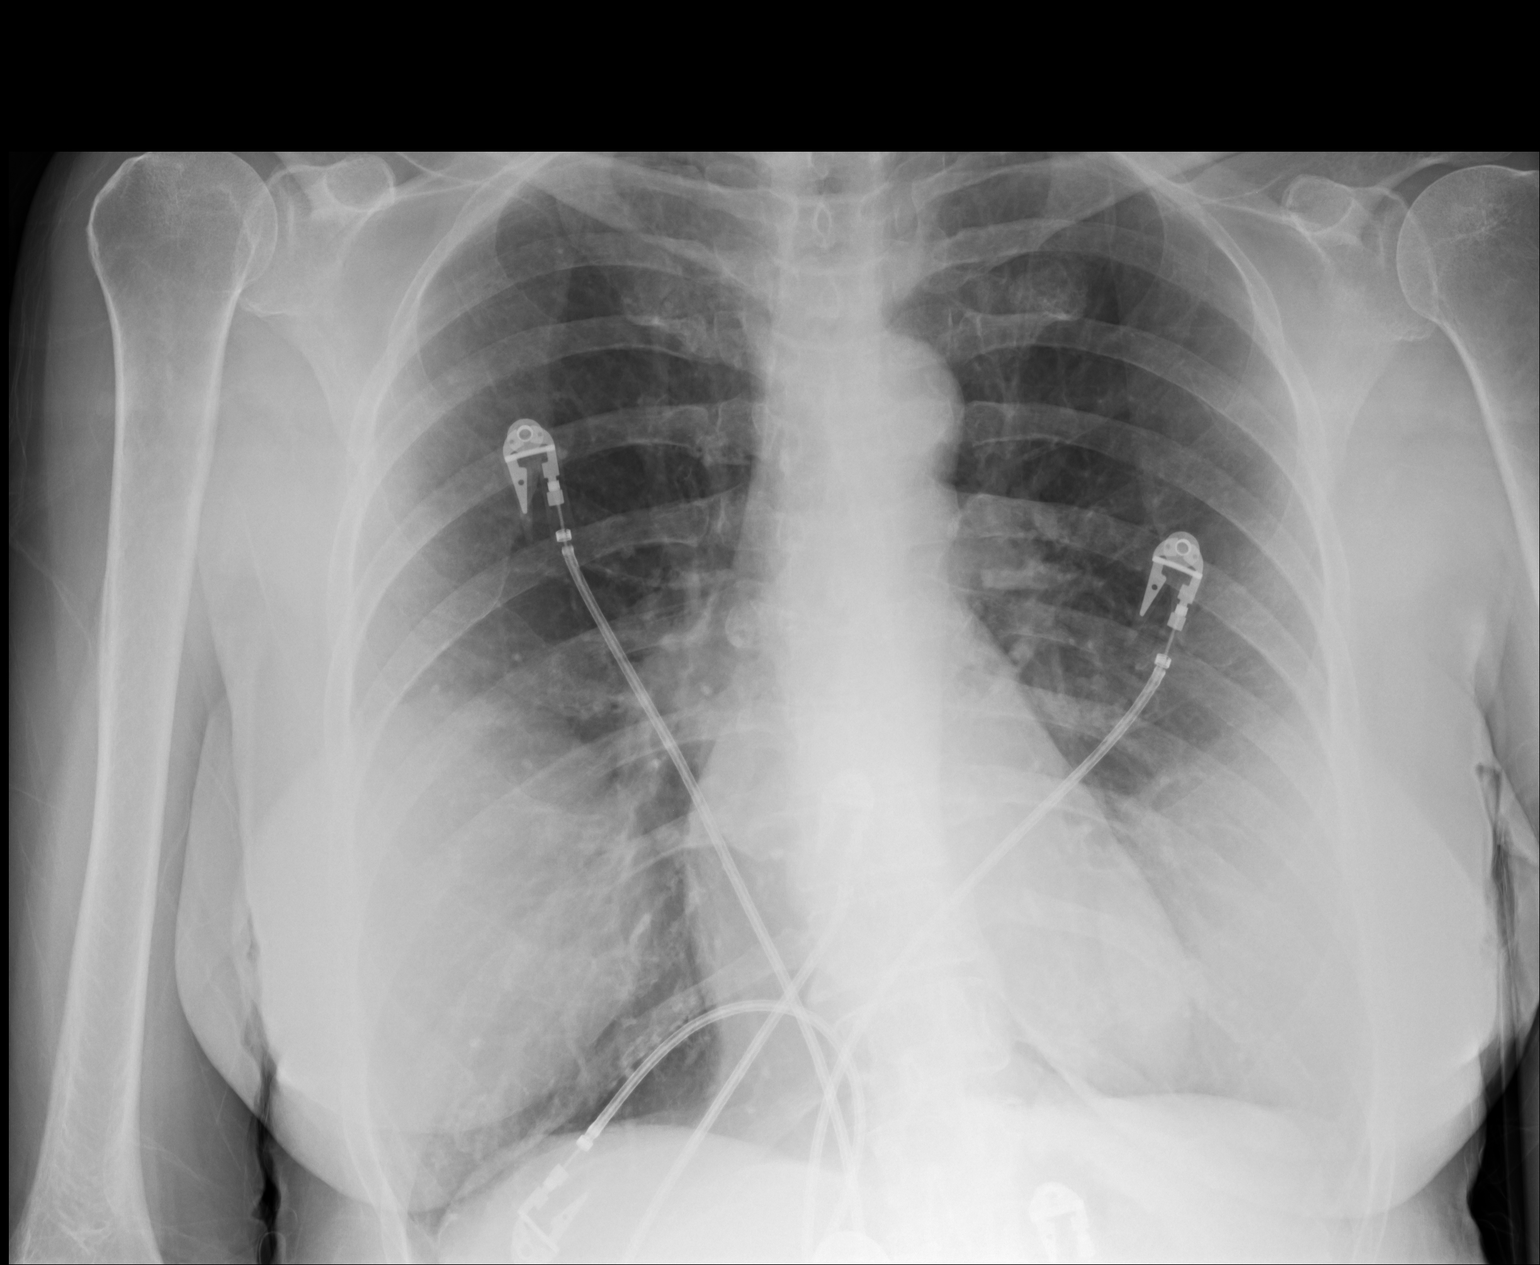
[im 2/2]
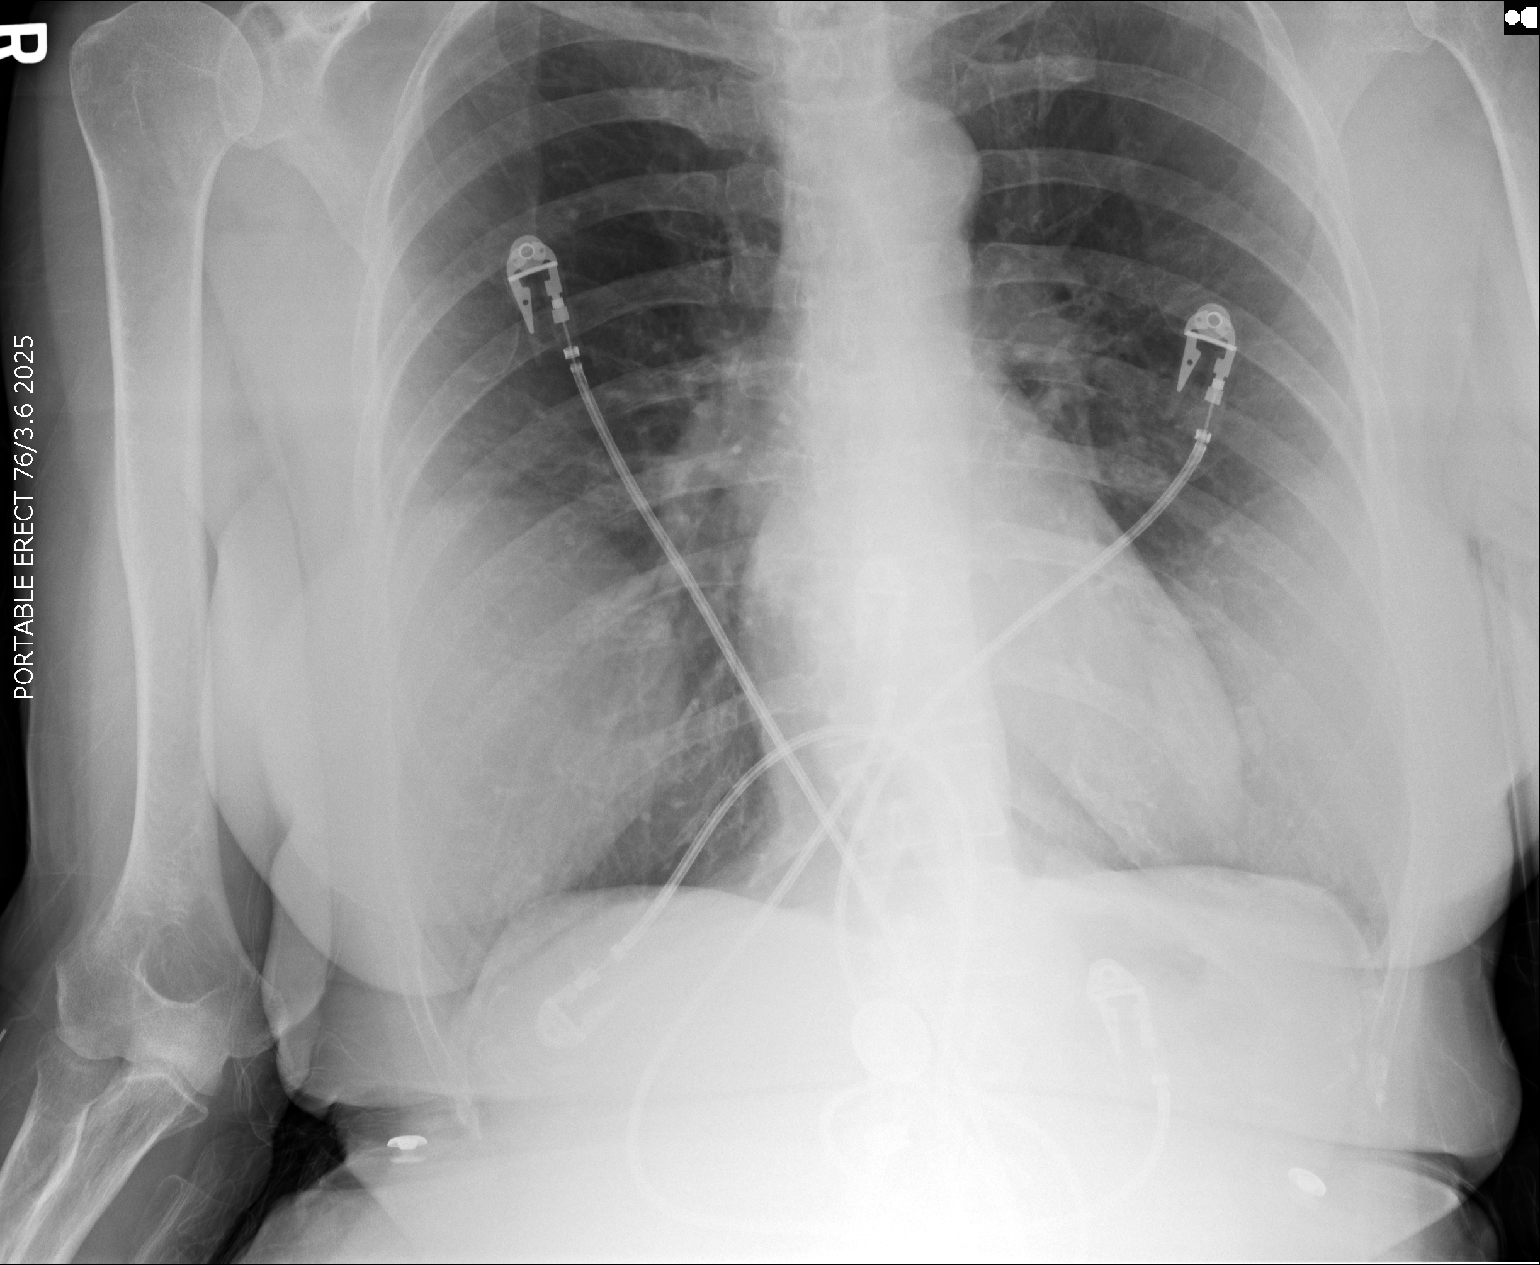

[2 of 2 positions shown; findings below may reference images not displayed]

FINDINGS: Single portable AP chest radiograph is provided.  There is no focal
parenchymal opacity, pleural effusion, or pneumothorax. Normal
cardiomediastinal silhouette. The osseous structures are unremarkable.
IMPRESSION: No acute disease of the chest.

## 2012-07-09 ENCOUNTER — Emergency Department (HOSPITAL_COMMUNITY)
Admission: EM | Admit: 2012-07-09 | Discharge: 2012-07-09 | Disposition: A | Discharge status: Home / Self Care | Payer: Medicare Other | Attending: Emergency Medicine | Admitting: Emergency Medicine

## 2012-07-09 ENCOUNTER — Emergency Department (HOSPITAL_COMMUNITY): Payer: Medicare Other

## 2012-07-09 ENCOUNTER — Encounter (HOSPITAL_COMMUNITY): Payer: Self-pay | Admitting: Emergency Medicine

## 2012-07-09 DIAGNOSIS — I2510 Atherosclerotic heart disease of native coronary artery without angina pectoris: Secondary | ICD-10-CM | POA: Insufficient documentation

## 2012-07-09 DIAGNOSIS — J4489 Other specified chronic obstructive pulmonary disease: Secondary | ICD-10-CM | POA: Insufficient documentation

## 2012-07-09 DIAGNOSIS — R0602 Shortness of breath: Secondary | ICD-10-CM | POA: Insufficient documentation

## 2012-07-09 DIAGNOSIS — Z87891 Personal history of nicotine dependence: Secondary | ICD-10-CM | POA: Insufficient documentation

## 2012-07-09 DIAGNOSIS — Z9861 Coronary angioplasty status: Secondary | ICD-10-CM | POA: Insufficient documentation

## 2012-07-09 DIAGNOSIS — K219 Gastro-esophageal reflux disease without esophagitis: Secondary | ICD-10-CM | POA: Insufficient documentation

## 2012-07-09 DIAGNOSIS — R071 Chest pain on breathing: Secondary | ICD-10-CM | POA: Insufficient documentation

## 2012-07-09 DIAGNOSIS — Z8709 Personal history of other diseases of the respiratory system: Secondary | ICD-10-CM | POA: Insufficient documentation

## 2012-07-09 DIAGNOSIS — R0789 Other chest pain: Secondary | ICD-10-CM

## 2012-07-09 DIAGNOSIS — F411 Generalized anxiety disorder: Secondary | ICD-10-CM | POA: Insufficient documentation

## 2012-07-09 DIAGNOSIS — Z79899 Other long term (current) drug therapy: Secondary | ICD-10-CM | POA: Insufficient documentation

## 2012-07-09 DIAGNOSIS — Z8669 Personal history of other diseases of the nervous system and sense organs: Secondary | ICD-10-CM | POA: Insufficient documentation

## 2012-07-09 DIAGNOSIS — J449 Chronic obstructive pulmonary disease, unspecified: Secondary | ICD-10-CM | POA: Insufficient documentation

## 2012-07-09 LAB — CBC WITH DIFFERENTIAL/PLATELET
Basophils Absolute: 0 10*3/uL (ref 0.0–0.1)
Eosinophils Absolute: 0.1 10*3/uL (ref 0.0–0.7)
Lymphocytes Relative: 28 % (ref 12–46)
Lymphs Abs: 1.6 10*3/uL (ref 0.7–4.0)
Neutrophils Relative %: 63 % (ref 43–77)
Platelets: 174 10*3/uL (ref 150–400)
RBC: 4.37 MIL/uL (ref 3.87–5.11)
RDW: 13.9 % (ref 11.5–15.5)
WBC: 5.5 10*3/uL (ref 4.0–10.5)

## 2012-07-09 LAB — COMPREHENSIVE METABOLIC PANEL
ALT: 17 U/L (ref 0–35)
AST: 22 U/L (ref 0–37)
Alkaline Phosphatase: 66 U/L (ref 39–117)
CO2: 30 mEq/L (ref 19–32)
Calcium: 10.4 mg/dL (ref 8.4–10.5)
GFR calc Af Amer: >90 mL/min (ref >90)
Glucose, Bld: 96 mg/dL (ref 70–99)
Potassium: 5 mEq/L (ref 3.5–5.1)
Sodium: 140 mEq/L (ref 135–145)
Total Protein: 6.8 g/dL (ref 6.0–8.3)

## 2012-07-09 IMAGING — CR DG CHEST 2V
1 series · 2 of 2 positions shown · non-contrast
Comparison: none

REASON FOR EXAM: SOB
COMMENTS:

PROCEDURE:     DXR - DXR CHEST PA (OR AP) AND LATERAL  - March 29, 2011 [DATE]
RESULT:     Comparison: 03/28/2011

[Series 1: pa · 0.17mm/px · 2 of 2 slices shown]
[im 1/2]
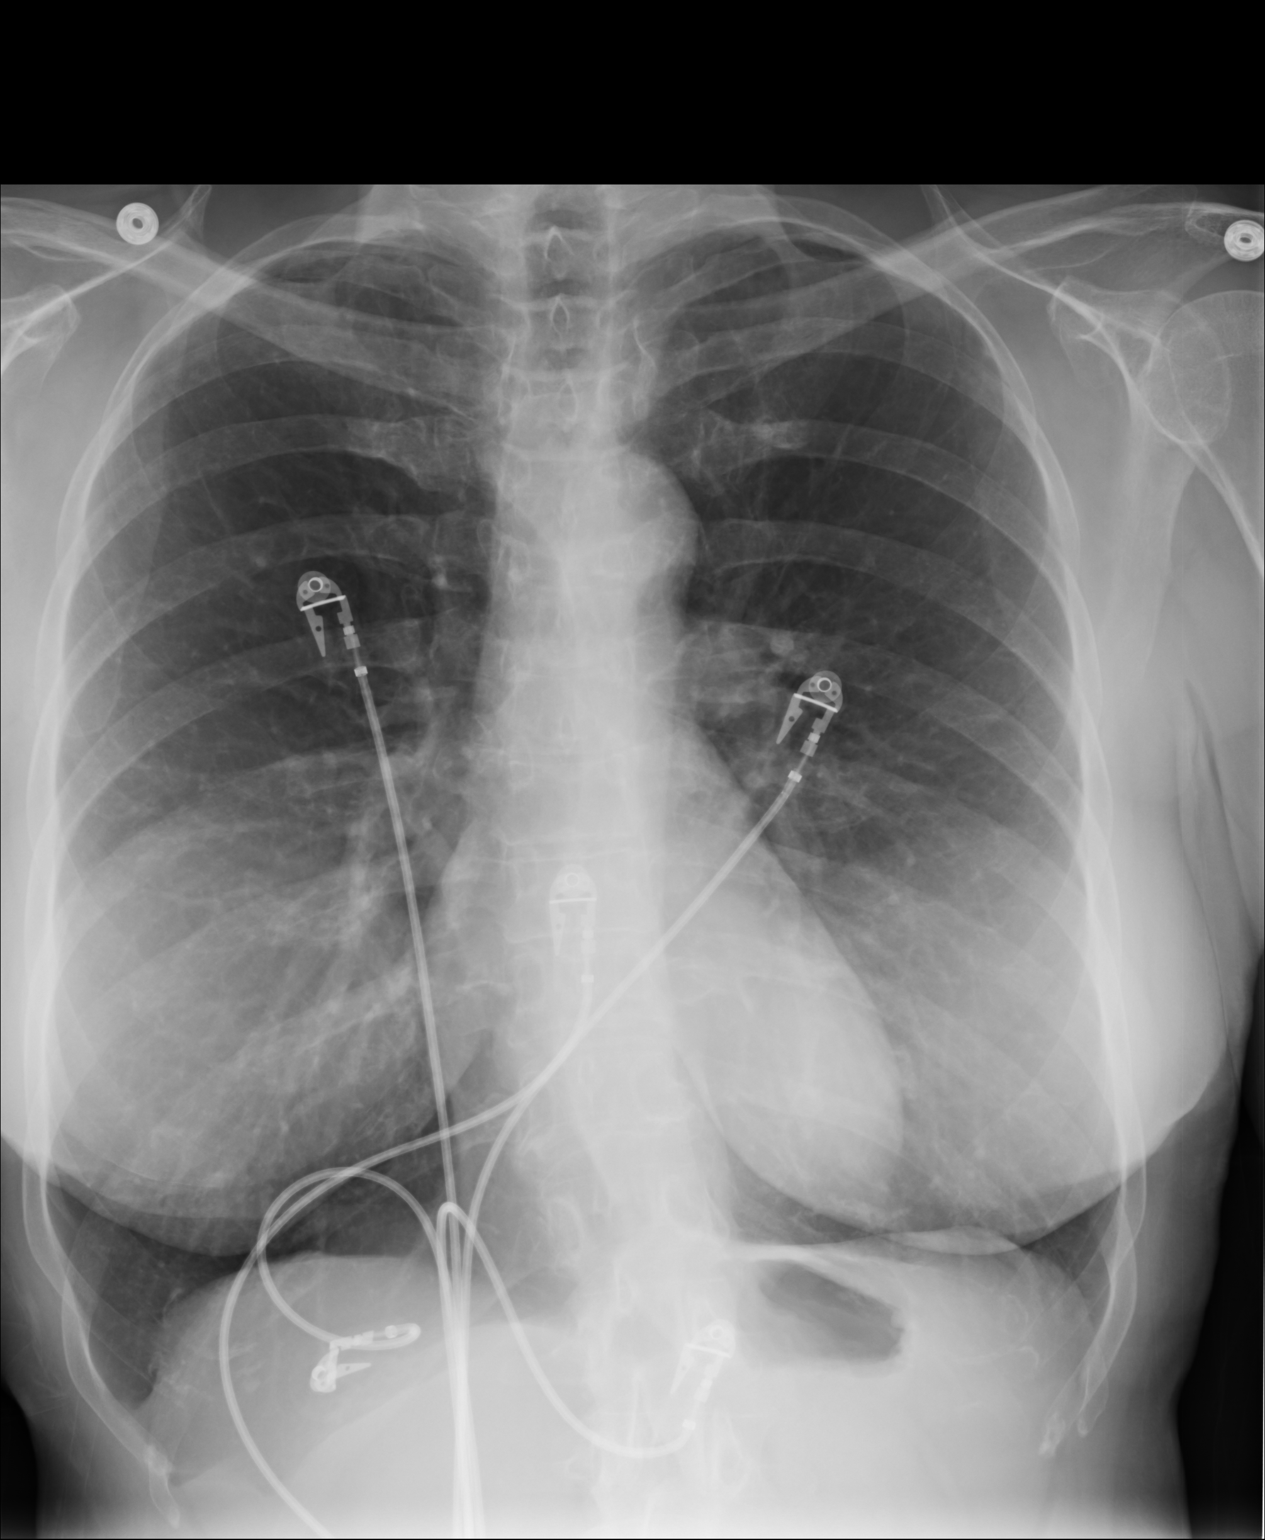
[im 2/2]
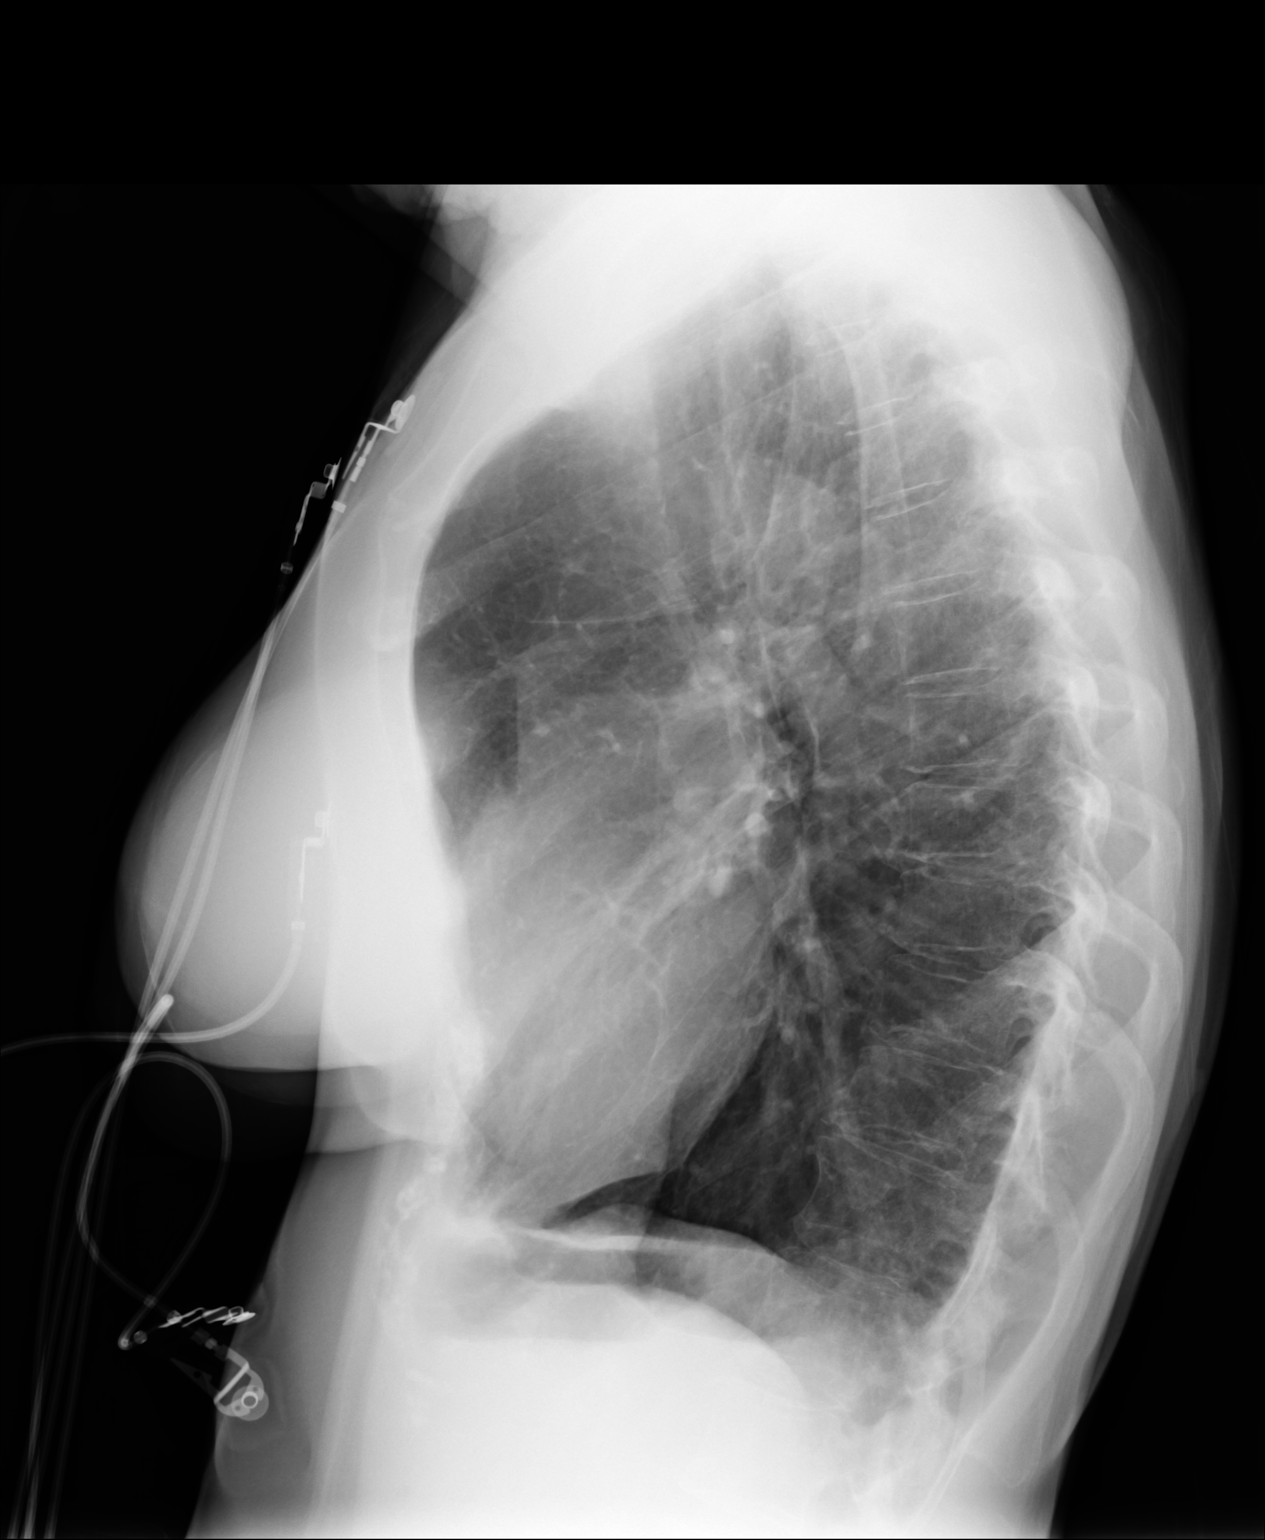

[2 of 2 positions shown; findings below may reference images not displayed]

FINDINGS: PA and lateral chest radiographs are provided. The lungs are hyperinflated
likely secondary to COPD. There is no focal parenchymal opacity, pleural
effusion, or pneumothorax. The heart and mediastinum are unremarkable.  The
osseous structures are unremarkable.
IMPRESSION: No acute disease of the chest.

## 2012-07-09 MED ORDER — ALBUTEROL SULFATE (5 MG/ML) 0.5% IN NEBU
2.5000 mg | INHALATION_SOLUTION | Freq: Once | RESPIRATORY_TRACT | Status: AC
  Administered 2012-07-09: 2.5 mg via RESPIRATORY_TRACT
  Filled 2012-07-09 (×2): qty 0.5

## 2012-07-09 MED ORDER — HYDROCODONE-ACETAMINOPHEN 5-325 MG PO TABS: 1.0000 | ORAL_TABLET | ORAL | Status: DC | PRN

## 2012-07-09 MED ORDER — IOHEXOL 350 MG/ML SOLN
100.0000 mL | Freq: Once | INTRAVENOUS | Status: AC | PRN
  Administered 2012-07-09: 100 mL via INTRAVENOUS

## 2012-07-09 NOTE — ED Notes (Signed)
CT notified that patient is ready.

## 2012-07-09 NOTE — ED Provider Notes (Signed)
History     CSN: [id number]  Arrival date & time 07/09/12  4098   First MD Initiated Contact with Patient 07/09/12 [phone]      Chief Complaint  Patient presents with  . Chest Pain    (Consider location/radiation/quality/duration/timing/severity/associated sxs/prior treatment) HPI Comments: Patient presents to the ER for evaluation of right-sided chest pain and shortness of breath. Symptoms began approximately 40 minutes ago. Patient reports that she is short of breath as well, can't take a deep breath. She is not experiencing any increased pain with breathing, just feels like she cannot catch her breath. Patient has not had any cough or fever. Patient reports that she had a pneumothorax secondary to a long procedure and just had a chest tube removed approximately 3 weeks ago.  Patient is a 72 y.o. female presenting with chest pain.  Chest Pain Associated symptoms: shortness of breath     Past Medical History  Diagnosis Date  . COPD, severe 07/29/98  . Coronary artery disease 01/22/99    Non Q-wave MI, stent RCA  . GERD (gastroesophageal reflux disease) 08/29/98    severe  . Hypertension     03/30/00  . History of ETT 08/03/09    Myoview Normal  . History of ETT 05/1999    Cardiolite pos ETT, neg Cardiolite  . Hx of cardiac catheterization 08/29/01    60% RCA 0/w 20-30% lesions  . Hx of cardiac catheterization 01/22/99    w/Stent 90% RCA lesion  . History of ETT 09/02/01    Normal  . History of ETT 04/28/2006    Myoview normal EF 83%  . History of CT scan of head 08/02/09    w/o mild age appropr atrophy  . History of blurry vision 05/06-05/10/2009    Hospital [facility] CP R/O'D Blurry vision, smoking  . History of ETT 04/10/11    normal EF and no ischemia per Dr. Darrold Junker  . COPD (chronic obstructive pulmonary disease)   . Anxiety   . Shortness of breath   . Pneumothorax     Past Surgical History  Procedure Laterality Date  . Laminectomy  1976    Disc Removal X 2  Lumbar  . Mastectomy  03/1976    Bilateral due FCBD with implants [facility])  . Esophageal dilation  06/00    EGD  . Oophorectomy    . Abdominal hysterectomy      Family History  Problem Relation Age of Onset  . Stroke Mother   . Heart disease Mother     MI  . Cancer Father     Lung  . COPD Brother   . Cancer Brother     Lung with mets 2011  . Heart disease Brother     CAD  . Heart disease Sister     cirrhosis due heart disease  . Cirrhosis Sister   . Colon cancer Neg Hx     History  Substance Use Topics  . Smoking status: Former Smoker -- 0.40 packs/day for 50 years    Types: Cigarettes    Quit date: 04/10/2012  . Smokeless tobacco: Never Used     Comment: 1/4 PPD as of 2012  . Alcohol Use: No    OB History   Grav Para Term Preterm Abortions TAB SAB Ect Mult Living                  Review of Systems  Respiratory: Positive for shortness of breath.   Cardiovascular: Positive for chest pain.  All other systems reviewed and are negative.    Allergies  Amoxicillin and Sertraline hcl  Home Medications   Current Outpatient Rx  Name  Route  Sig  Dispense  Refill  . albuterol (PROVENTIL HFA;VENTOLIN HFA) 108 (90 BASE) MCG/ACT inhaler   Inhalation   Inhale 2 puffs into the lungs 4 (four) times daily as needed for wheezing or shortness of breath.         . ALPRAZolam (XANAX) 0.5 MG tablet   Oral   Take 0.5 mg by mouth 3 (three) times daily as needed for sleep.         Marland Kitchen aspirin EC 325 MG EC tablet   Oral   Take 1 tablet (325 mg total) by mouth daily.   30 tablet      . cyanocobalamin (,VITAMIN B-12,) 1000 MCG/ML injection   Intramuscular   Inject 1,000 mcg into the muscle every 8 (eight) weeks. Pt receives at MD office         . metoprolol succinate (TOPROL-XL) 25 MG 24 hr tablet   Oral   Take 25 mg by mouth every morning.          . nitroGLYCERIN (NITROSTAT) 0.4 MG SL tablet   Sublingual   Place 0.4 mg under the tongue every 5 (five)  minutes as needed. For chest pain.         . simvastatin (ZOCOR) 10 MG tablet   Oral   Take 10 mg by mouth at bedtime.         Marland Kitchen tiotropium (SPIRIVA HANDIHALER) 18 MCG inhalation capsule   Inhalation   Place 18 mcg into inhaler and inhale at bedtime.            There were no vitals taken for this visit.  Physical Exam  Constitutional: She is oriented to person, place, and time. She appears well-developed and well-nourished. No distress.  HENT:  Head: Normocephalic and atraumatic.  Right Ear: Hearing normal.  Nose: Nose normal.  Mouth/Throat: Oropharynx is clear and moist and mucous membranes are normal.  Eyes: Conjunctivae and EOM are normal. Pupils are equal, round, and reactive to light.  Neck: Normal range of motion. Neck supple.  Cardiovascular: Normal rate, regular rhythm, S1 normal and S2 normal.  Exam reveals no gallop and no friction rub.   No murmur heard. Pulmonary/Chest: Effort normal. No respiratory distress. She has decreased breath sounds in the right lower field. She exhibits no tenderness.  Abdominal: Soft. Normal appearance and bowel sounds are normal. There is no hepatosplenomegaly. There is no tenderness. There is no rebound, no guarding, no tenderness at McBurney's point and negative Murphy's sign. No hernia.  Musculoskeletal: Normal range of motion.  Neurological: She is alert and oriented to person, place, and time. She has normal strength. No cranial nerve deficit or sensory deficit. Coordination normal. GCS eye subscore is 4. GCS verbal subscore is 5. GCS motor subscore is 6.  Skin: Skin is warm, dry and intact. No rash noted. No cyanosis.  Psychiatric: She has a normal mood and affect. Her speech is normal and behavior is normal. Thought content normal.    ED Course  Procedures (including critical care time)   Date: 07/09/2012  Rate: 72  Rhythm: normal sinus rhythm and premature atrial contractions (PAC)  QRS Axis: normal  Intervals: normal   ST/T Wave abnormalities: nonspecific T wave changes  Conduction Disutrbances:none  Narrative Interpretation:   Old EKG Reviewed: unchanged    Labs Reviewed  COMPREHENSIVE METABOLIC  PANEL - Abnormal; Notable for the following:    Albumin 3.4 (*)    GFR calc non Af Amer 84 (*)    All other components within normal limits  CBC WITH DIFFERENTIAL  LIPASE, BLOOD   Dg Chest 2 View  07/09/2012  *RADIOLOGY REPORT*  Clinical Data: Right-sided chest pain.  Shortness of breath. Current history of COPD.  Prior history of pneumothorax.  Former smoker who quit in December, 2013.  CHEST - 2 VIEW  Comparison: Two-view chest x-ray 06/30/2012, 06/23/2012, 05/28/2012, and one-view chest x-ray 04/29/2012.  Findings: Cardiac silhouette normal in size, unchanged.  Thoracic aorta atherosclerotic, unchanged.  Hilar and mediastinal contours otherwise unremarkable.  Chronic pleuroparenchymal scarring at the right lung base, unchanged.  Chronic mild biapical pleuroparenchymal scarring, unchanged.  Emphysematous changes throughout both lungs with hyperinflation, unchanged.  No new pulmonary parenchymal abnormalities.  Mild degenerative changes involving the mid thoracic spine.  IMPRESSION: COPD/emphysema.  Right basilar pleuroparenchymal scarring.  No acute cardiopulmonary disease.   Original Report Authenticated By: Hulan Saas, M.D.    Ct Angio Chest Pe W/cm &/or Wo Cm  07/09/2012  *RADIOLOGY REPORT*  Clinical Data: Right sided chest pain, shortness of breath  CT ANGIOGRAPHY CHEST  Technique:  Multidetector CT imaging of the chest using the standard protocol during bolus administration of intravenous contrast. Multiplanar reconstructed images including MIPs were obtained and reviewed to evaluate the vascular anatomy.  Contrast: OMNIPAQUE IOHEXOL 350 MG/ML SOLN  Comparison: Chest radiographs dated 07/09/2012.  CT chest dated 06/02/2012.  Findings: No evidence of pulmonary embolism.  14 x 13 mm thin-walled cavitary  lesion in the right lung apex (series 5/image 17), previously 16 x 14 mm.  Moderate centrilobular emphysematous changes.  No suspicious pulmonary nodules.  Trace right pleural fluid, decreased.  No pneumothorax.  Visualized thyroid is grossly unremarkable.  The heart is normal in size.  No pericardial effusion.  Coronary atherosclerosis.  Atherosclerotic calcifications of the aortic arch.  No suspicious mediastinal, hilar, or axillary lymphadenopathy.  Bilateral breast augmentation.  Visualized upper abdomen is unremarkable.  Mild degenerative changes of the lower thoracolumbar spine.  IMPRESSION: No evidence of pulmonary embolism.  14 mm thin-walled cavitary lesion in the right lung apex, decreased, likely infectious/inflammatory.  Trace right pleural fluid, decreased.   Original Report Authenticated By: Charline Bills, M.D.      Diagnosis: Chest wall pain    MDM  Patient presents to the ER for evaluation of right-sided chest pain under the right breast. Patient reports a previous history of collapsed lung secondary to biopsy procedure. She had a chest tube in place until 3 weeks ago. X-ray did not show any evidence of pneumothorax or pneumonia. Chest CT was performed including CT angiogram rule out PE. No acute findings are seen. There is no concern for cardiac etiology of the pain. Patient is very anxious, given Xanax for this.        Gilda Crease, MD 07/10/12 [phone]

## 2012-07-09 NOTE — ED Notes (Signed)
Patient transported to X-ray 

## 2012-07-09 NOTE — ED Notes (Signed)
Patient brought in via [facility] with C/O CP on the right side for approximately 40 mins. Patient had chest tube which was removed two to three weeks ago for a collapsed lung. Patient advised that she was sitting in her chair with no activity with the onset of pain. Diminished breath sounds noted on the right.

## 2012-07-09 NOTE — ED Notes (Signed)
Patient discharged with instructions using the teach back method. Patient verbalizes an understanding

## 2012-07-09 NOTE — ED Notes (Signed)
Back from xray placed on all monitors.

## 2012-07-10 ENCOUNTER — Emergency Department (HOSPITAL_COMMUNITY)
Admission: EM | Admit: 2012-07-10 | Discharge: 2012-07-10 | Disposition: A | Discharge status: Home / Self Care | Payer: Medicare Other | Attending: Emergency Medicine | Admitting: Emergency Medicine

## 2012-07-10 ENCOUNTER — Encounter (HOSPITAL_COMMUNITY): Payer: Self-pay | Admitting: Physical Medicine and Rehabilitation

## 2012-07-10 DIAGNOSIS — F3289 Other specified depressive episodes: Secondary | ICD-10-CM | POA: Insufficient documentation

## 2012-07-10 DIAGNOSIS — F329 Major depressive disorder, single episode, unspecified: Secondary | ICD-10-CM | POA: Insufficient documentation

## 2012-07-10 DIAGNOSIS — K228 Other specified diseases of esophagus: Secondary | ICD-10-CM | POA: Insufficient documentation

## 2012-07-10 DIAGNOSIS — Z7982 Long term (current) use of aspirin: Secondary | ICD-10-CM | POA: Insufficient documentation

## 2012-07-10 DIAGNOSIS — Z8709 Personal history of other diseases of the respiratory system: Secondary | ICD-10-CM | POA: Insufficient documentation

## 2012-07-10 DIAGNOSIS — K222 Esophageal obstruction: Secondary | ICD-10-CM | POA: Insufficient documentation

## 2012-07-10 DIAGNOSIS — J4489 Other specified chronic obstructive pulmonary disease: Secondary | ICD-10-CM | POA: Insufficient documentation

## 2012-07-10 DIAGNOSIS — Z79899 Other long term (current) drug therapy: Secondary | ICD-10-CM | POA: Insufficient documentation

## 2012-07-10 DIAGNOSIS — Z8719 Personal history of other diseases of the digestive system: Secondary | ICD-10-CM | POA: Insufficient documentation

## 2012-07-10 DIAGNOSIS — R000 Tachycardia, unspecified: Secondary | ICD-10-CM | POA: Insufficient documentation

## 2012-07-10 DIAGNOSIS — Z9861 Coronary angioplasty status: Secondary | ICD-10-CM | POA: Insufficient documentation

## 2012-07-10 DIAGNOSIS — R0682 Tachypnea, not elsewhere classified: Secondary | ICD-10-CM | POA: Insufficient documentation

## 2012-07-10 DIAGNOSIS — Z87891 Personal history of nicotine dependence: Secondary | ICD-10-CM | POA: Insufficient documentation

## 2012-07-10 DIAGNOSIS — J449 Chronic obstructive pulmonary disease, unspecified: Secondary | ICD-10-CM | POA: Insufficient documentation

## 2012-07-10 DIAGNOSIS — I2510 Atherosclerotic heart disease of native coronary artery without angina pectoris: Secondary | ICD-10-CM | POA: Insufficient documentation

## 2012-07-10 DIAGNOSIS — F411 Generalized anxiety disorder: Secondary | ICD-10-CM | POA: Insufficient documentation

## 2012-07-10 DIAGNOSIS — Z8669 Personal history of other diseases of the nervous system and sense organs: Secondary | ICD-10-CM | POA: Insufficient documentation

## 2012-07-10 DIAGNOSIS — F419 Anxiety disorder, unspecified: Secondary | ICD-10-CM

## 2012-07-10 DIAGNOSIS — I10 Essential (primary) hypertension: Secondary | ICD-10-CM | POA: Insufficient documentation

## 2012-07-10 DIAGNOSIS — R0602 Shortness of breath: Secondary | ICD-10-CM | POA: Insufficient documentation

## 2012-07-10 HISTORY — DX: Esophageal obstruction: K22.2

## 2012-07-10 HISTORY — DX: Other specified disease of esophagus: K22.89

## 2012-07-10 HISTORY — DX: Other specified diseases of esophagus: K22.8

## 2012-07-10 MED ORDER — LORAZEPAM 1 MG PO TABS
1.0000 mg | ORAL_TABLET | Freq: Once | ORAL | Status: AC
  Administered 2012-07-10: 1 mg via ORAL
  Filled 2012-07-10: qty 1

## 2012-07-10 NOTE — ED Provider Notes (Signed)
Medical screening examination/treatment/procedure(s) were performed by non-physician practitioner and as supervising physician I was immediately available for consultation/collaboration.   Mckenzie Mclaughlin. Phuoc Huy, MD 07/10/12 1539

## 2012-07-10 NOTE — ED Notes (Signed)
Pt presents to department for evaluation of anxiety. States she is nervous and shaking and wants to speak with a preacher. Also states she wants to be evaluated by a psychiatrist. Denies SI/HI at the time. Pt is conscious alert and oriented x4. Denies pain.

## 2012-07-10 NOTE — ED Provider Notes (Signed)
History     CSN: [id number]  Arrival date & time 07/10/12  9147   First MD Initiated Contact with Patient 07/10/12 1107      Chief Complaint  Patient presents with  . Anxiety    (Consider location/radiation/quality/duration/timing/severity/associated sxs/prior treatment) HPI Comments: 72 year old female presents emergency department with her sister complaining of worsening anxiety since being in the emergency department yesterday. Patient was seen in the emergency department for evaluation of chest pain and shortness of breath, with a full workup including a chest CT was performed to rule out PE which was unremarkable. She was given a prescription for Xanax at discharge for anxiety, however patient states she tried taking one of these at 9:30 this morning 2 hours prior to arrival without any relief. Currently she denies chest pain. States she becomes so anxious that she was her breath. She feels as if she is going to die soon. Denies SI/HI.   Patient is a 72 y.o. female presenting with anxiety. The history is provided by the patient.  Anxiety Pertinent negatives include no chest pain.    Past Medical History  Diagnosis Date  . COPD, severe 07/29/98  . Coronary artery disease 01/22/99    Non Q-wave MI, stent RCA  . GERD (gastroesophageal reflux disease) 08/29/98    severe  . Hypertension     03/30/00  . History of ETT 08/03/09    Myoview Normal  . History of ETT 05/1999    Cardiolite pos ETT, neg Cardiolite  . Hx of cardiac catheterization 08/29/01    60% RCA 0/w 20-30% lesions  . Hx of cardiac catheterization 01/22/99    w/Stent 90% RCA lesion  . History of ETT 09/02/01    Normal  . History of ETT 04/28/2006    Myoview normal EF 83%  . History of CT scan of head 08/02/09    w/o mild age appropr atrophy  . History of blurry vision 05/06-05/10/2009    Hospital [facility] CP R/O'D Blurry vision, smoking  . History of ETT 04/10/11    normal EF and no ischemia per Dr. Darrold Junker  .  COPD (chronic obstructive pulmonary disease)   . Anxiety   . Shortness of breath   . Pneumothorax     Past Surgical History  Procedure Laterality Date  . Laminectomy  1976    Disc Removal X 2 Lumbar  . Mastectomy  03/1976    Bilateral due FCBD with implants [facility])  . Esophageal dilation  06/00    EGD  . Oophorectomy    . Abdominal hysterectomy      Family History  Problem Relation Age of Onset  . Stroke Mother   . Heart disease Mother     MI  . Cancer Father     Lung  . COPD Brother   . Cancer Brother     Lung with mets 2011  . Heart disease Brother     CAD  . Heart disease Sister     cirrhosis due heart disease  . Cirrhosis Sister   . Colon cancer Neg Hx     History  Substance Use Topics  . Smoking status: Former Smoker -- 0.40 packs/day for 50 years    Types: Cigarettes    Quit date: 04/10/2012  . Smokeless tobacco: Never Used     Comment: 1/4 PPD as of 2012  . Alcohol Use: No    OB History   Grav Para Term Preterm Abortions TAB SAB Ect Mult Living  Review of Systems  Respiratory: Positive for shortness of breath.   Cardiovascular: Negative for chest pain.  Psychiatric/Behavioral: Negative for suicidal ideas. The patient is nervous/anxious.   All other systems reviewed and are negative.    Allergies  Amoxicillin and Sertraline hcl  Home Medications   Current Outpatient Rx  Name  Route  Sig  Dispense  Refill  . albuterol (PROVENTIL HFA;VENTOLIN HFA) 108 (90 BASE) MCG/ACT inhaler   Inhalation   Inhale 2 puffs into the lungs 4 (four) times daily as needed for wheezing or shortness of breath.         . ALPRAZolam (XANAX) 0.5 MG tablet   Oral   Take 0.5 mg by mouth 3 (three) times daily as needed for sleep.         Marland Kitchen aspirin EC 325 MG EC tablet   Oral   Take 1 tablet (325 mg total) by mouth daily.   30 tablet      . budesonide-formoterol (SYMBICORT) 160-4.5 MCG/ACT inhaler   Inhalation   Inhale 2 puffs into the  lungs 2 (two) times daily.         . cyanocobalamin (,VITAMIN B-12,) 1000 MCG/ML injection   Intramuscular   Inject 1,000 mcg into the muscle every 8 (eight) weeks. Pt receives at MD office         . ENSURE (ENSURE)   Oral   Take 1 Can by mouth daily as needed (for strength).         Marland Kitchen HYDROcodone-acetaminophen (NORCO/VICODIN) 5-325 MG per tablet   Oral   Take 1-2 tablets by mouth every 4 (four) hours as needed for pain.   15 tablet   0   . metoprolol succinate (TOPROL-XL) 25 MG 24 hr tablet   Oral   Take 25 mg by mouth every morning.          . simvastatin (ZOCOR) 10 MG tablet   Oral   Take 10 mg by mouth at bedtime.         Marland Kitchen tiotropium (SPIRIVA HANDIHALER) 18 MCG inhalation capsule   Inhalation   Place 18 mcg into inhaler and inhale at bedtime.            BP 159/108  Pulse 113  Temp(Src) 97.6 F (36.4 C) (Oral)  Resp 22  SpO2 99%  Physical Exam  Nursing note and vitals reviewed. Constitutional: She is oriented to person, place, and time. She appears well-developed and well-nourished.  Very anxious.  HENT:  Head: Normocephalic and atraumatic.  Mouth/Throat: Oropharynx is clear and moist.  Eyes: Conjunctivae and EOM are normal. Pupils are equal, round, and reactive to light.  Neck: Normal range of motion. Neck supple.  Cardiovascular: Regular rhythm, normal heart sounds, intact distal pulses and normal pulses.  Tachycardia present.   Pulmonary/Chest: Tachypnea noted. She has no decreased breath sounds. She has no wheezes. She has no rhonchi. She has no rales. She exhibits no tenderness.  Abdominal: Soft. Bowel sounds are normal. There is no tenderness.  Musculoskeletal: Normal range of motion. She exhibits no edema.  Neurological: She is alert and oriented to person, place, and time.  Skin: Skin is warm and dry. She is not diaphoretic.  Psychiatric: Her mood appears anxious. She exhibits a depressed mood. She expresses no suicidal ideation.    ED  Course  Procedures (including critical care time)  Labs Reviewed - No data to display Dg Chest 2 View  07/09/2012  *RADIOLOGY REPORT*  Clinical Data: Right-sided chest pain.  Shortness of breath. Current history of COPD.  Prior history of pneumothorax.  Former smoker who quit in December, 2013.  CHEST - 2 VIEW  Comparison: Two-view chest x-ray 06/30/2012, 06/23/2012, 05/28/2012, and one-view chest x-ray 04/29/2012.  Findings: Cardiac silhouette normal in size, unchanged.  Thoracic aorta atherosclerotic, unchanged.  Hilar and mediastinal contours otherwise unremarkable.  Chronic pleuroparenchymal scarring at the right lung base, unchanged.  Chronic mild biapical pleuroparenchymal scarring, unchanged.  Emphysematous changes throughout both lungs with hyperinflation, unchanged.  No new pulmonary parenchymal abnormalities.  Mild degenerative changes involving the mid thoracic spine.  IMPRESSION: COPD/emphysema.  Right basilar pleuroparenchymal scarring.  No acute cardiopulmonary disease.   Original Report Authenticated By: Hulan Saas, M.D.    Ct Angio Chest Pe W/cm &/or Wo Cm  07/09/2012  *RADIOLOGY REPORT*  Clinical Data: Right sided chest pain, shortness of breath  CT ANGIOGRAPHY CHEST  Technique:  Multidetector CT imaging of the chest using the standard protocol during bolus administration of intravenous contrast. Multiplanar reconstructed images including MIPs were obtained and reviewed to evaluate the vascular anatomy.  Contrast: OMNIPAQUE IOHEXOL 350 MG/ML SOLN  Comparison: Chest radiographs dated 07/09/2012.  CT chest dated 06/02/2012.  Findings: No evidence of pulmonary embolism.  14 x 13 mm thin-walled cavitary lesion in the right lung apex (series 5/image 17), previously 16 x 14 mm.  Moderate centrilobular emphysematous changes.  No suspicious pulmonary nodules.  Trace right pleural fluid, decreased.  No pneumothorax.  Visualized thyroid is grossly unremarkable.  The heart is normal in size.   No pericardial effusion.  Coronary atherosclerosis.  Atherosclerotic calcifications of the aortic arch.  No suspicious mediastinal, hilar, or axillary lymphadenopathy.  Bilateral breast augmentation.  Visualized upper abdomen is unremarkable.  Mild degenerative changes of the lower thoracolumbar spine.  IMPRESSION: No evidence of pulmonary embolism.  14 mm thin-walled cavitary lesion in the right lung apex, decreased, likely infectious/inflammatory.  Trace right pleural fluid, decreased.   Original Report Authenticated By: Charline Bills, M.D.      1. Anxiety       MDM  72 y/o female with anxiety. Sister states she has had anxiety issues for along time and has never been on long-term medications. Workup in ED yesterday for SOB was negative. Noted to be anxious at discharge then as well. PE was also ruled out. No CP or worsening sob today. 1mg  Ativan given in ED which calmed patient, however when asking what the medication was she says "that makes me cry" and began crying. She has ativan at home. Sister will call her PCP first thing tomorrow to schedule an appointment and discuss long-term medications. Vitals stable. She is stable for discharge. Return precautions discussed. Patient and family state understanding of plan and are agreeable.         Trevor Mace, PA-C 07/10/12 1234

## 2012-07-10 NOTE — ED Notes (Signed)
PT feels like She going to die because  She has COPD. Pt does use nasal O2 PRN at home

## 2012-07-10 NOTE — ED Notes (Signed)
Pt reports having a Panic this AM. Pt reports she felt like She was going to die. Family reports Pt is shaky.

## 2012-07-10 NOTE — Discharge Instructions (Signed)
Take Ativan every 6 hours for anxiety. Follow up with your primary care doctor tomorrow.  Anxiety and Panic Attacks Your caregiver has informed you that you are having an anxiety or panic attack. There may be many forms of this. Most of the time these attacks come suddenly and without warning. They come at any time of day, including periods of sleep, and at any time of life. They may be strong and unexplained. Although panic attacks are very scary, they are physically harmless. Sometimes the cause of your anxiety is not known. Anxiety is a protective mechanism of the body in its fight or flight mechanism. Most of these perceived danger situations are actually nonphysical situations (such as anxiety over losing a job). CAUSES  The causes of an anxiety or panic attack are many. Panic attacks may occur in otherwise healthy people given a certain set of circumstances. There may be a genetic cause for panic attacks. Some medications may also have anxiety as a side effect. SYMPTOMS  Some of the most common feelings are:  Intense terror.  Dizziness, feeling faint.  Hot and cold flashes.  Fear of going crazy.  Feelings that nothing is real.  Sweating.  Shaking.  Chest pain or a fast heartbeat (palpitations).  Smothering, choking sensations.  Feelings of impending doom and that death is near.  Tingling of extremities, this may be from over-breathing.  Altered reality (derealization).  Being detached from yourself (depersonalization). Several symptoms can be present to make up anxiety or panic attacks. DIAGNOSIS  The evaluation by your caregiver will depend on the type of symptoms you are experiencing. The diagnosis of anxiety or panic attack is made when no physical illness can be determined to be a cause of the symptoms. TREATMENT  Treatment to prevent anxiety and panic attacks may include:  Avoidance of circumstances that cause anxiety.  Reassurance and relaxation.  Regular  exercise.  Relaxation therapies, such as yoga.  Psychotherapy with a psychiatrist or therapist.  Avoidance of caffeine, alcohol and illegal drugs.  Prescribed medication. SEEK IMMEDIATE MEDICAL CARE IF:   You experience panic attack symptoms that are different than your usual symptoms.  You have any worsening or concerning symptoms. Document Released: 03/16/2005 Document Revised: 06/08/2011 Document Reviewed: 07/18/2009 [facility] Patient Information 2013 [place], [place].

## 2012-07-10 NOTE — ED Notes (Signed)
PT reports " I need apple sauce to take my meds."

## 2012-07-11 ENCOUNTER — Encounter: Payer: Self-pay | Admitting: Family Medicine

## 2012-07-11 ENCOUNTER — Telehealth: Payer: Self-pay | Admitting: Family Medicine

## 2012-07-11 ENCOUNTER — Ambulatory Visit (INDEPENDENT_AMBULATORY_CARE_PROVIDER_SITE_OTHER): Payer: Medicare Other | Admitting: Family Medicine

## 2012-07-11 VITALS — BP 132/84 | HR 84 | Temp 97.8°F

## 2012-07-11 DIAGNOSIS — F341 Dysthymic disorder: Secondary | ICD-10-CM | POA: Insufficient documentation

## 2012-07-11 DIAGNOSIS — F419 Anxiety disorder, unspecified: Secondary | ICD-10-CM | POA: Insufficient documentation

## 2012-07-11 DIAGNOSIS — F329 Major depressive disorder, single episode, unspecified: Secondary | ICD-10-CM

## 2012-07-11 MED ORDER — LORAZEPAM 1 MG PO TABS: 1.0000 mg | ORAL_TABLET | Freq: Three times a day (TID) | ORAL | Status: DC | PRN

## 2012-07-11 MED ORDER — VENLAFAXINE HCL 37.5 MG PO TABS: 37.5000 mg | ORAL_TABLET | Freq: Two times a day (BID) | ORAL | Status: DC

## 2012-07-11 NOTE — Assessment & Plan Note (Signed)
Recent imaging and w/u was negative. Episodic insomnia, worry, fear, SOB. H/o anxiety but "this is the worst it has been." Sig social upheaval with recent illness, PTX, admission, procedures, pain. No SI/HI.  D/w pt about options.  Would consider counseling.  Would start effexor now given sertraline intol prev and use prn lorazepam.  She agrees.  She'll update in me about 48 hours.  >25 min spent with face to face with patient, >50% counseling and/or coordinating care.

## 2012-07-11 NOTE — Telephone Encounter (Signed)
[facility] Triage Call Report Triage Record Num: [id number] Operator: Aundra Millet Patient Name: [facility] Call Date & Time: 07/10/2012 7:29:29AM Patient Phone: [phone] PCP: Crawford Givens Patient Gender: Female PCP Fax : Patient DOB: 05-Mar-1941 Practice Name: [facility] Reason for Call: Caller: Bonnie/Sibling; PCP: Crawford Givens Clelia Croft) [facility]); CB#: [phone]; Call regarding Saying she is gonna die ; Today, 07/10/2012 , Sister calling stating pt has been agitated and shaking since 0300. Pt seen at ED on 07/09/2012 and was evaluated for trouble breathing and had testing, and advised to contiune medication regimen for her COPD and anxiety. Pt crying hysterically since 0300 stating she "is going to die" and worrying about everything. Pt has had c/o of chest pain and hard time breathing, and hard to take deep breathe She took her Proair at 0515 and no relief. She also took Xanax at 0335 but not helpful with anxiety. Pt having trouble talking. No suicidal ideation, nor gestures. RN reached Activate EMS 911 for severe breathing problems per Anxiety:Panic protocol and RN advised to hang up and dial 911 now. Protocol(s) Used: Anxiety: Panic Recommended Outcome per Protocol: Activate EMS 911 Reason for Outcome: Severe breathing problems Care Advice: ~ Protect the patient from falling or other harm. ~ Keep the patient calm. ~ Do not give the patient anything to eat or drink. ~ IMMEDIATE ACTION ~ Place person in a position of comfort and loosen tight clothing. An adult should stay with the patient, preferably one trained in CPR. If the person is not trained in CPR, then he or she should provide hands-only (compression-only) CPR as recommended by the American [employer] Association. ~ 07/10/2012 7:45:35AM Page 1 of 1 [id number]

## 2012-07-11 NOTE — Patient Instructions (Addendum)
Don't take the xanax.  Take the lorazepam as needed, up to 3 times a day.  Take the venlafaxine twice a day.  Let me know how you are doing in a few days, sooner if needed.  If you want to get started with counseling, then let me know.  Take care.

## 2012-07-11 NOTE — Progress Notes (Signed)
She was at home yesterday, got tearful, shaky, and she thought she was dying. Family took her to ER.  Dx'd with panic.  Recent imaging and w/u was negative.  Episodic insomnia, worry, fear, SOB. H/o anxiety but "this is the worst it has been."  Sig social upheaval with recent illness, PTX, admission, procedures, pain.  No SI/HI.   Meds, vitals, and allergies reviewed.   ROS: See HPI.  Otherwise, noncontributory.  nad but tearful Speech in complete sentences. Judgment intact Affect depressed.  Remainder of exam deferred.

## 2012-07-18 ENCOUNTER — Telehealth: Payer: Self-pay | Admitting: *Deleted

## 2012-07-18 MED ORDER — VENLAFAXINE HCL 37.5 MG PO TABS: 18.7500 mg | ORAL_TABLET | Freq: Two times a day (BID) | ORAL | Status: DC

## 2012-07-18 NOTE — Telephone Encounter (Signed)
The patient says she is unable to take the Venlafaxine that you recently prescribed.  It makes her sick and very dizzy and wobbly.  She has started splitting the tablet and taking one half (1/2) tablet twice a day.  She is able to tolerate that.  Please advise.

## 2012-07-18 NOTE — Telephone Encounter (Signed)
Then continue as is. If that doesn't help her mood, then notify me.  I updated the med list.  Thanks.

## 2012-07-19 NOTE — Telephone Encounter (Signed)
Patient advised.  She says she now has been able to increase the dosage to a whole tablet twice daily.  Meds list adjusted.

## 2012-07-22 ENCOUNTER — Telehealth: Payer: Self-pay

## 2012-07-22 NOTE — Telephone Encounter (Signed)
She can use MOM now but if not improved would use otc miralax 17g po qd prn until BM.  She should continue on bowel regimen thereafter, ie taking miralax qod or as needed to maintain regularity w/o inducing diarrhea.  Thanks.

## 2012-07-22 NOTE — Telephone Encounter (Signed)
Patient advised.

## 2012-07-22 NOTE — Telephone Encounter (Signed)
Pt has not had any BM in last 9 days; pt said has problem with constipation before but never more than 3 days without BM, pt has increased fresh fruit and vegetables. Pt said no abd pain or fever; "pt passing gas"; pt has MOM at home and wants to know if can take that with other meds pt is taking or what would Dr Para March suggest.[facility] [address] Pt request call back.

## 2012-07-31 IMAGING — CR DG CHEST 2V
1 series · 2 of 2 positions shown · non-contrast
Comparison: none

REASON FOR EXAM: chest pain
COMMENTS:   May transport without cardiac monitor

[Series 1: w chest pa · 0.14mm/px · 2 of 2 slices shown]
[im 1/2]
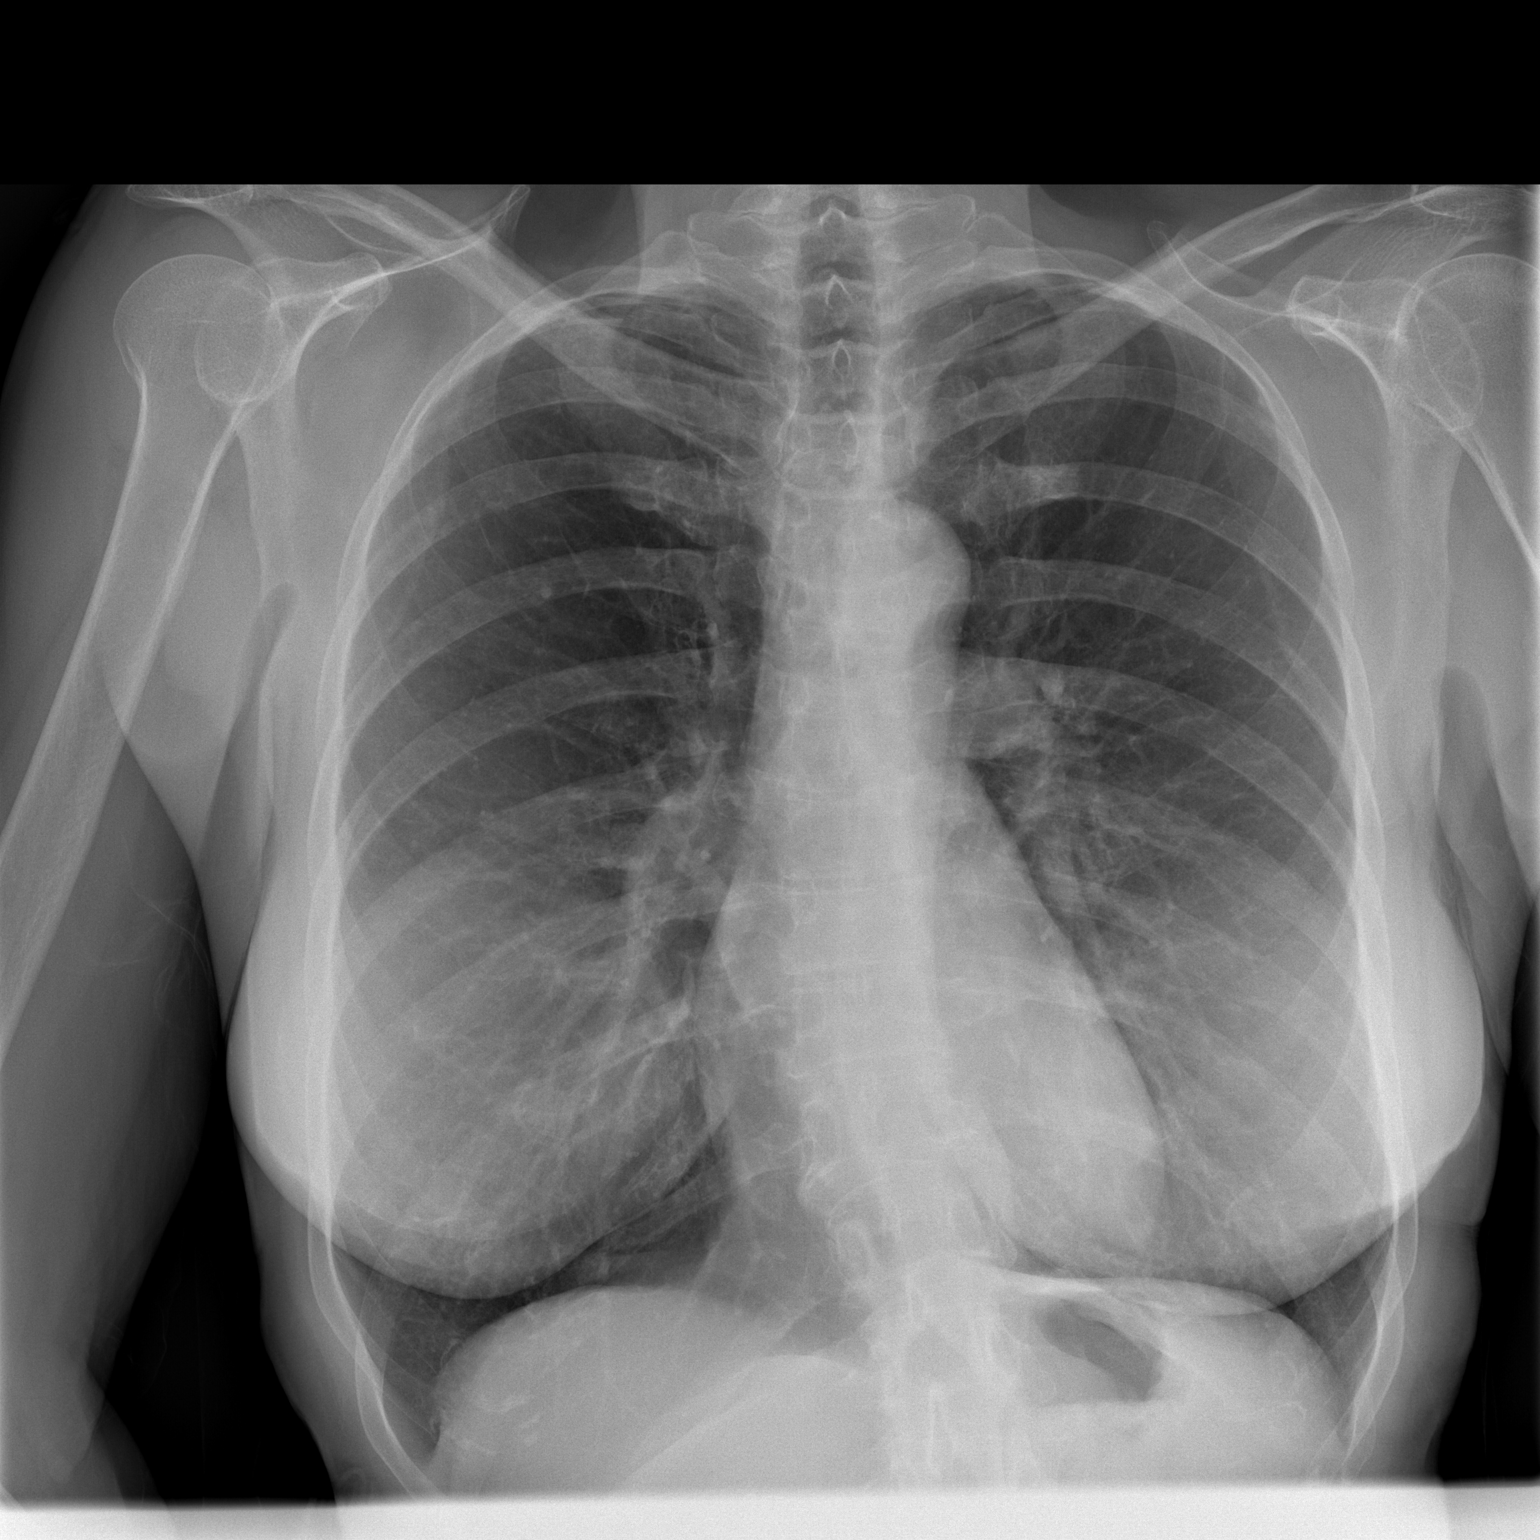
[im 2/2]
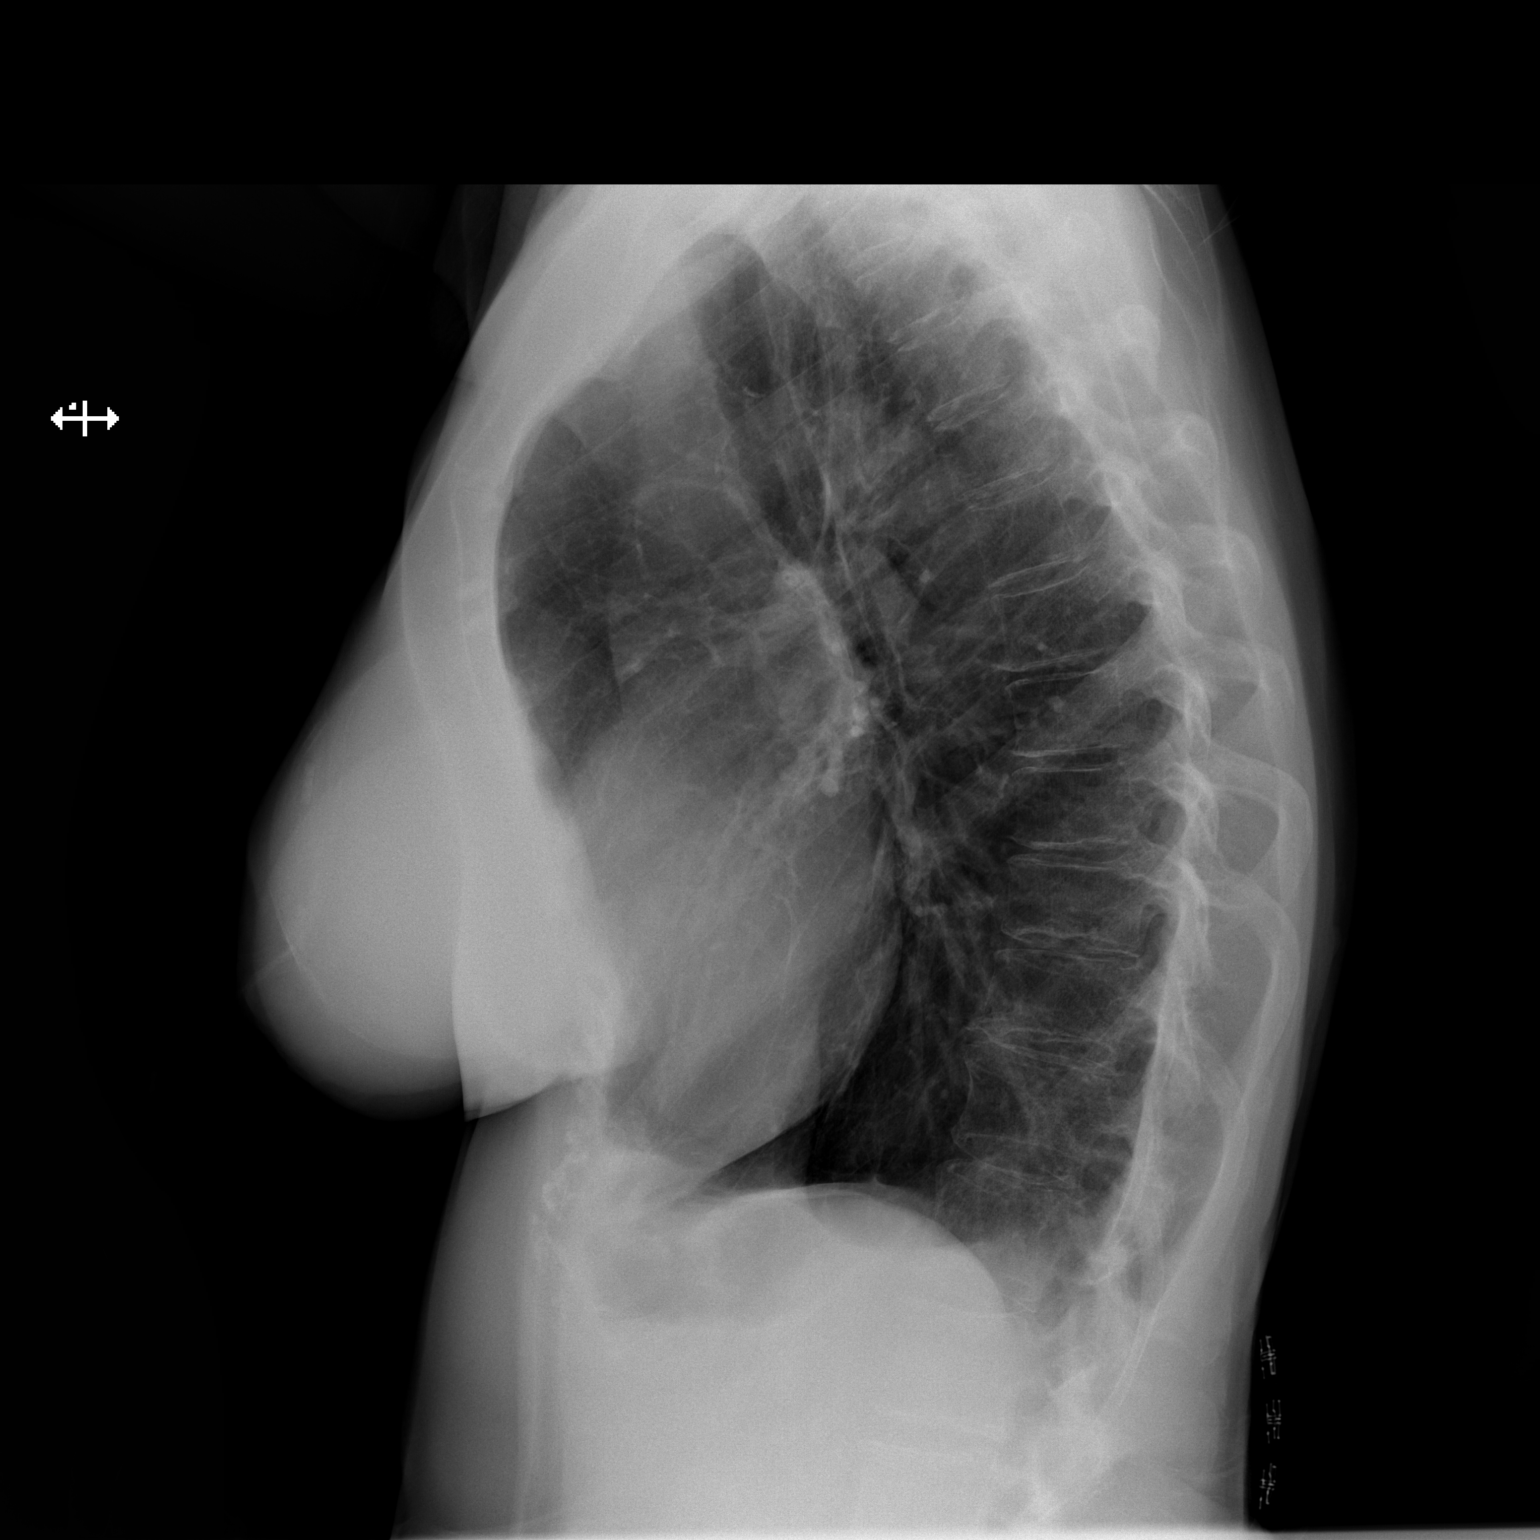

[2 of 2 positions shown; findings below may reference images not displayed]

PROCEDURE:     DXR - DXR CHEST PA (OR AP) AND LATERAL  - April 20, 2011  [DATE]

RESULT:     Comparison is made to the study 29 March, 2011.

The lungs remain hyperinflated. There is no focal infiltrate. There is no
evidence of a pneumothorax or pneumomediastinum. There is no pleural
effusion. The cardiac silhouette is normal in size. The pulmonary
vascularity is not engorged. There is curvature of the thoracolumbar spine.
IMPRESSION: The findings are consistent with COPD. There is no evidence
of CHF nor of pneumonia.

## 2012-07-31 IMAGING — CT CT ABD-PELV W/ CM
1 of 2 series · 15 of 32 positions shown, 19 images · non-contrast
Comparison: none

REASON FOR EXAM: (1) diffuse pain; (2) diffuse pain
COMMENTS:

[Series 2: 3mm soft tissue · axial · 0.68mm/px · z∈[-685,-325]mm · 15 of 132 slices shown, 19 images]
[im 6/132  soft-tissue]
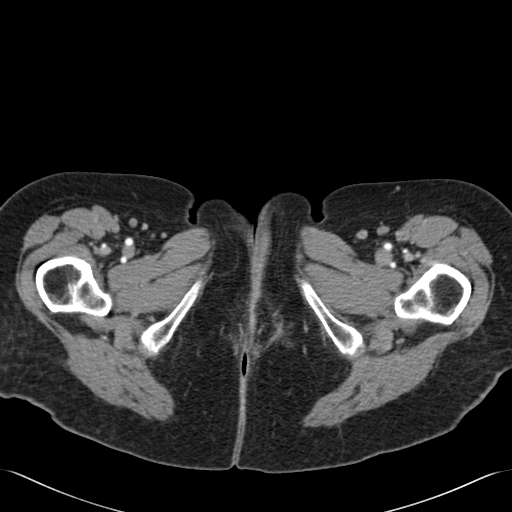
[im 6/132  bone]
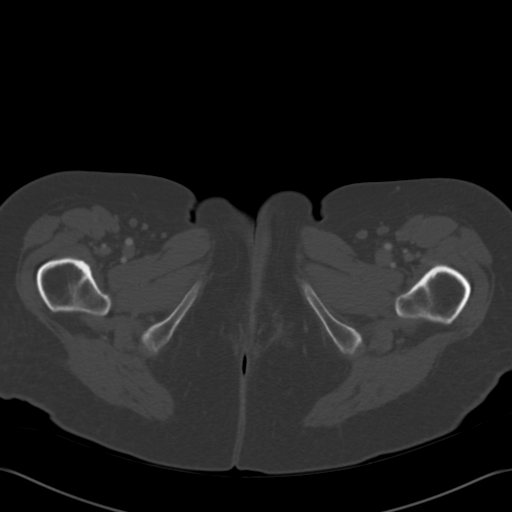
[im 17/132  soft-tissue]
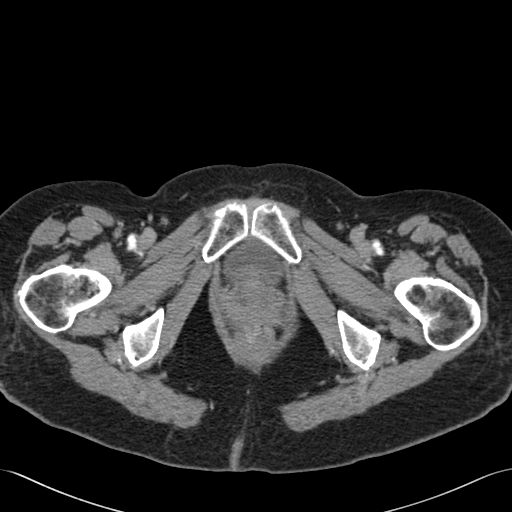
[im 28/132  soft-tissue]
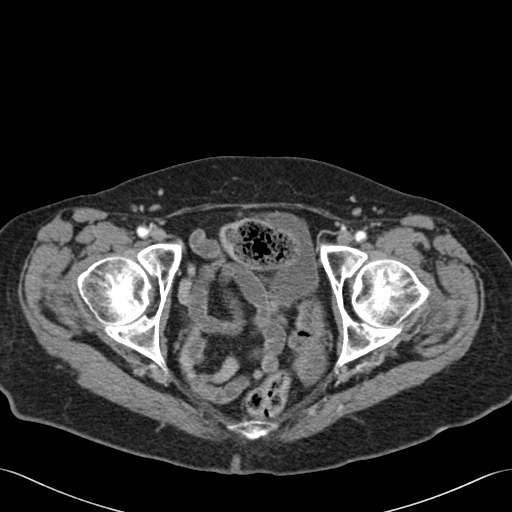
[im 39/132  soft-tissue]
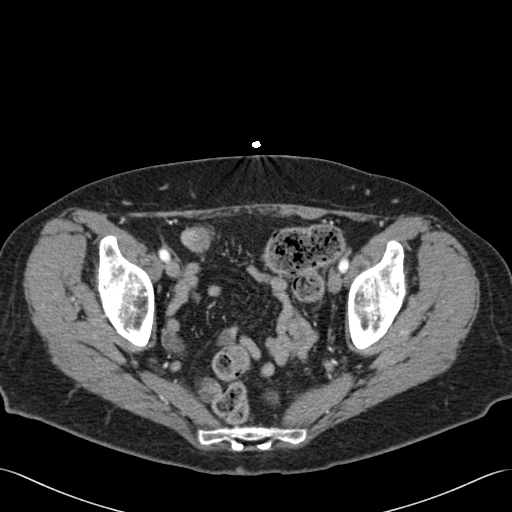
[im 44/132  soft-tissue]
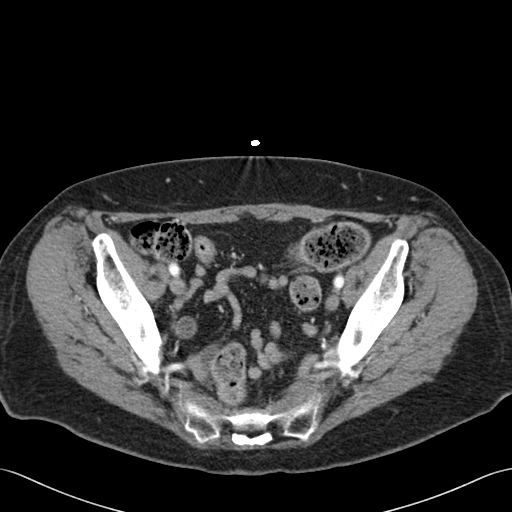
[im 55/132  soft-tissue]
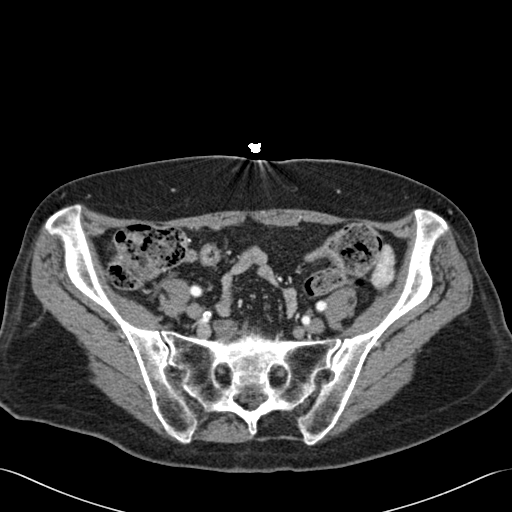
[im 66/132  soft-tissue]
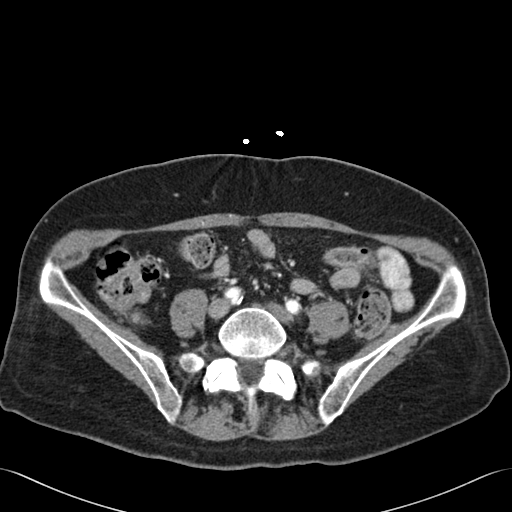
[im 77/132  soft-tissue]
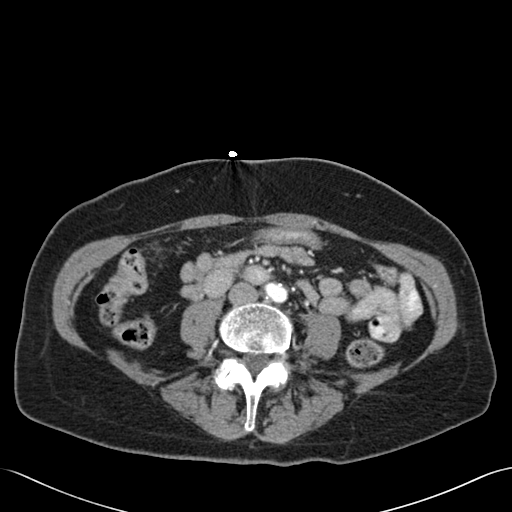
[im 88/132  soft-tissue]
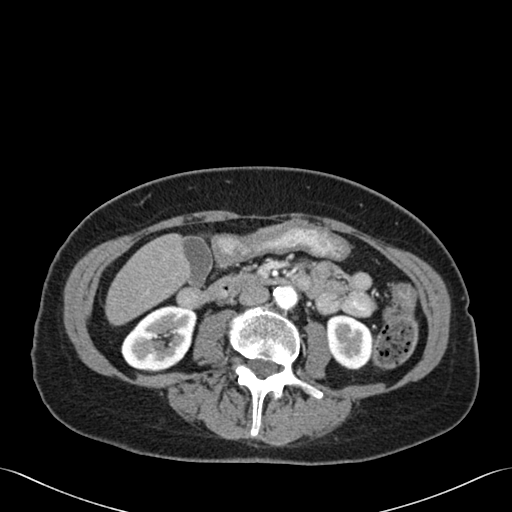
[im 88/132  bone]
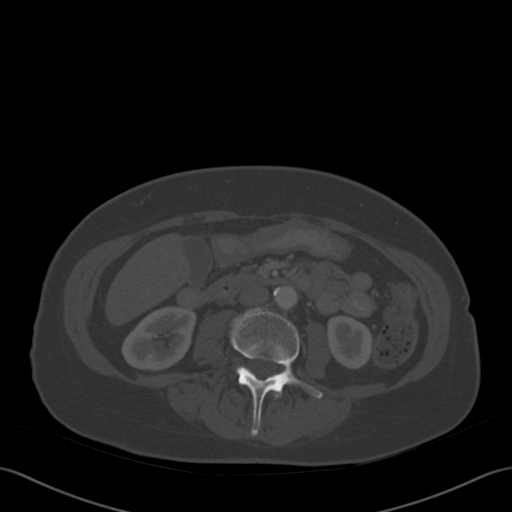
[im 93/132  soft-tissue]
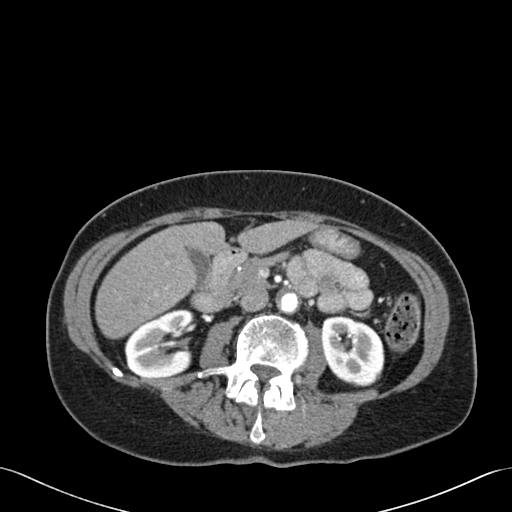
[im 104/132  soft-tissue]
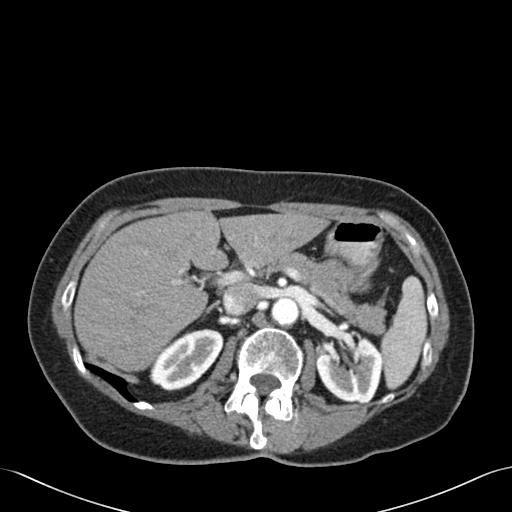
[im 110/132  lung]
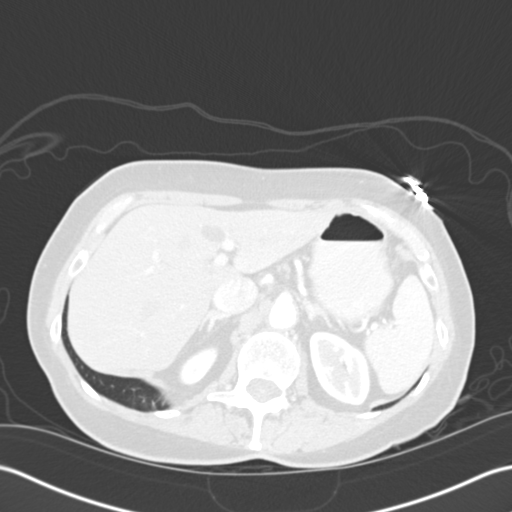
[im 115/132  soft-tissue]
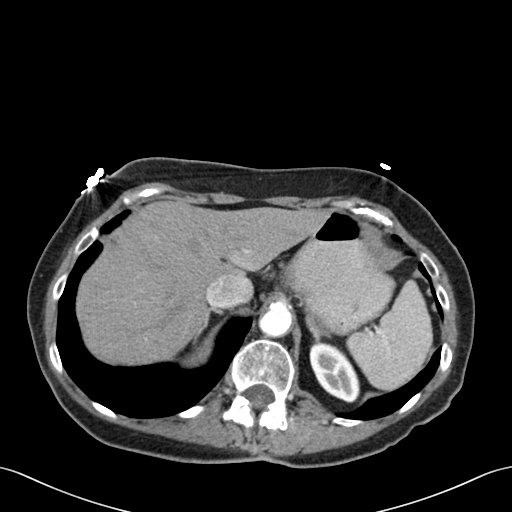
[im 115/132  lung]
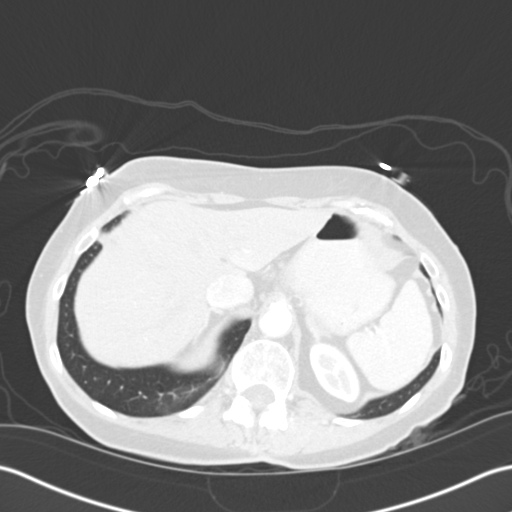
[im 121/132  lung]
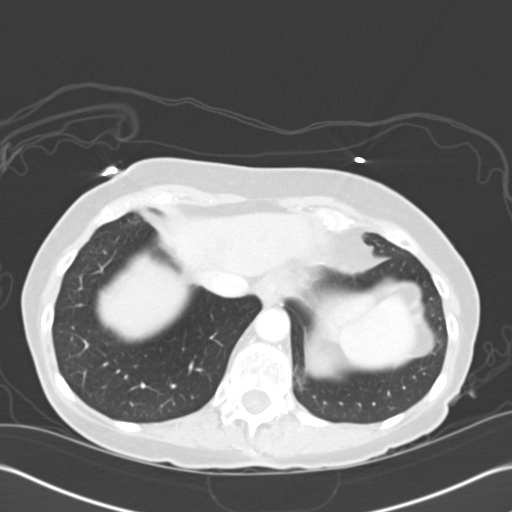
[im 126/132  soft-tissue]
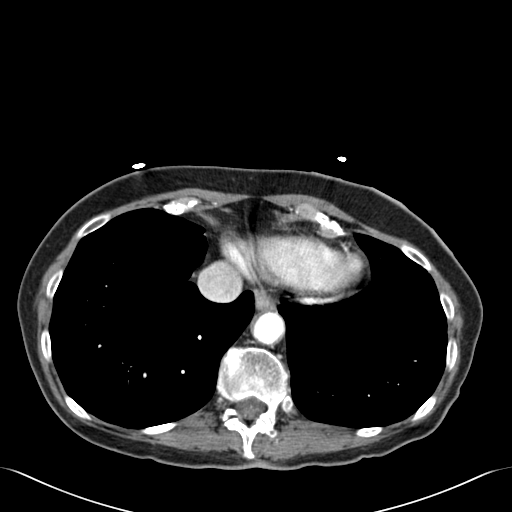
[im 126/132  lung]
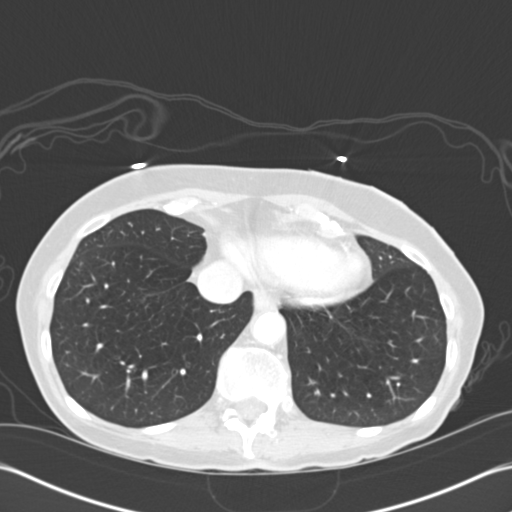

[15 of 32 positions shown; findings below may reference images not displayed]

PROCEDURE:     CT  - CT ABDOMEN / PELVIS  W  - April 20, 2011 [DATE]

RESULT:     Axial CT scanning was performed through the abdomen and pelvis
following intravenous administration of 85 cc of Asovue-QSY. The patient was
unable to tolerate oral contrast. Reconstructions were obtained at 3 mm
intervals and slice thicknesses. Review of multiplanar reconstructed images
was performed separately on the VIA monitor.

The liver exhibits normal density. There is a 1.0 by 1.8 cm diameter
hypodensity in the left lobe with Hounsfield measurement of -2 most
compatible with a cyst. The gallbladder is adequately distended with no
evidence of stones. There is no intrahepatic ductal dilation. The pancreas
exhibits no evidence of inflammatory change, ductal dilation, or focal
masses. The spleen, partially distended stomach, adrenal glands, and kidneys
are normal in appearance. The small amount of oral contrast that the patient
was able to ingest has reached the proximal jejunum. There is no evidence of
obstruction of the small or large bowel. The stool and gas pattern within
the colon is within the limits of normal. Within the pelvis the uterus is
surgically absent. The partially distended urinary bladder is normal in
appearance. I see no adnexal masses nor free pelvic fluid. There is a
structure consistent with a normal gas-filled appendix on images 78 through
84. I do not see evidence of acute diverticulitis nor other forms of colitis.

The caliber of the abdominal aorta is normal. The lung bases exhibit mild
emphysematous changes. The lumbar vertebral bodies are preserved in height.
IMPRESSION: 1. I see no evidence of bowel obstruction or ileus. No acute inflammatory
change of the bowel is demonstrated. There is no free extraluminal fluid nor
gas.
2. I do not see acute hepatobiliary abnormality nor acute urinary tract
abnormality.

## 2012-08-16 IMAGING — US ABDOMEN ULTRASOUND LIMITED
1 series · 17 of 25 positions shown · non-contrast
Comparison: none

REASON FOR EXAM: abd pain RUQ epigastric  chest pain  nausea weight loss
COMMENTS:

[Series 1: abdomen ultrasound limited · 17 of 94 slices shown]
[im 1/94]
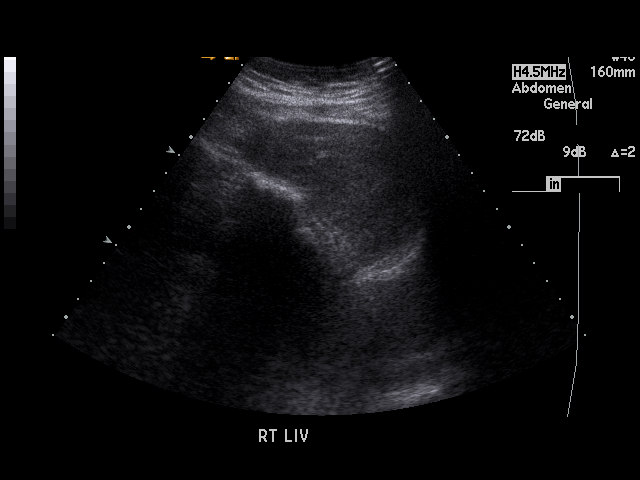
[im 8/94]
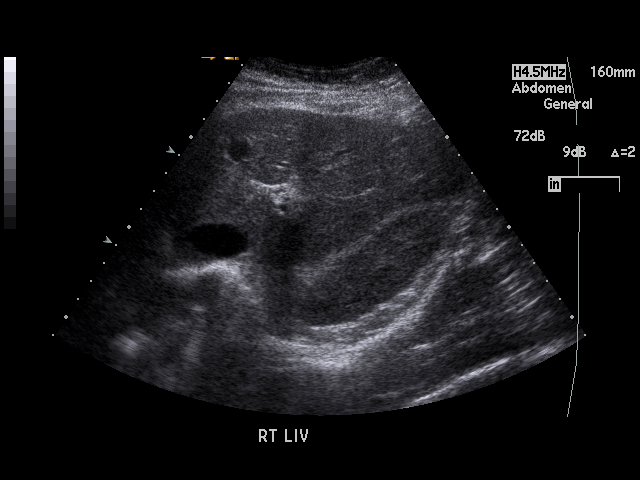
[im 12/94]
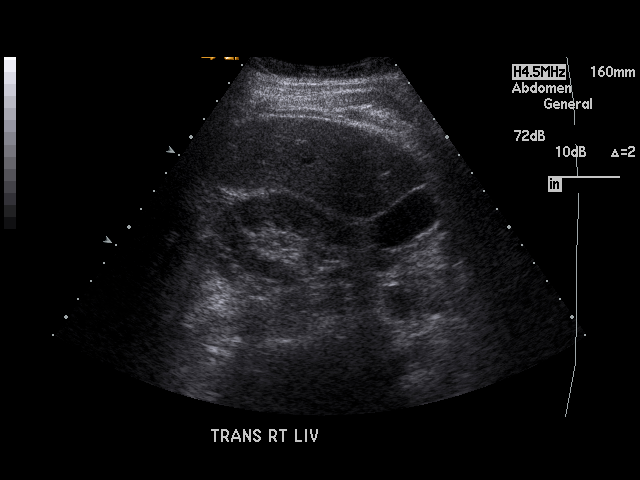
[im 20/94]
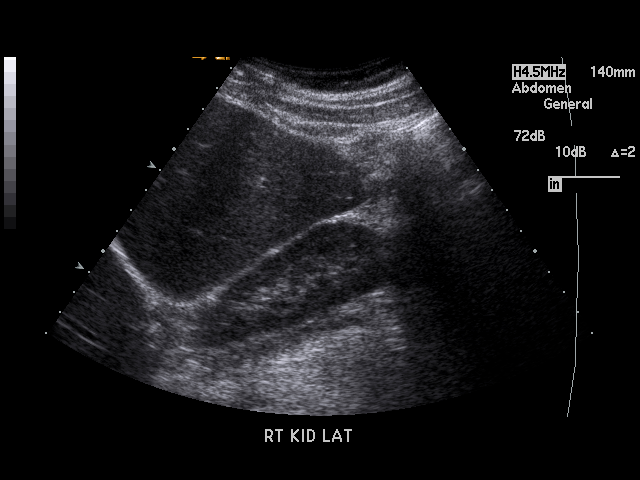
[im 24/94]
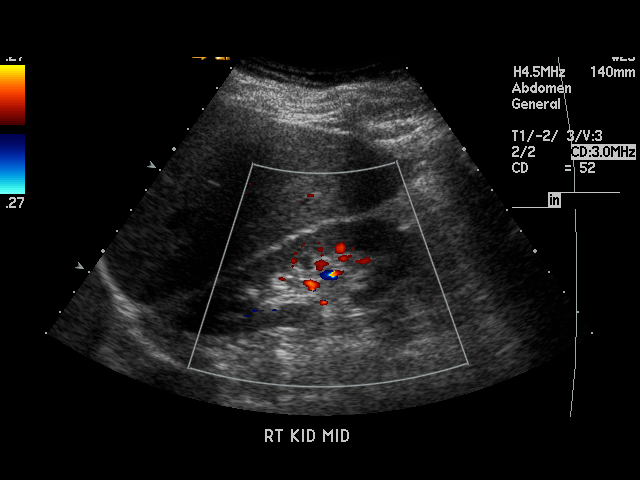
[im 32/94]
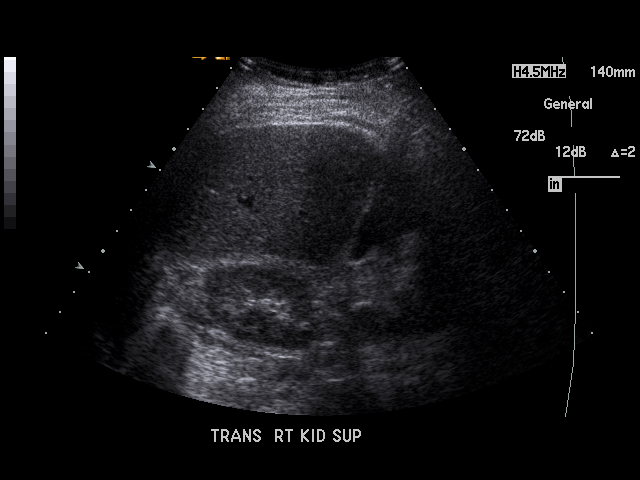
[im 35/94]
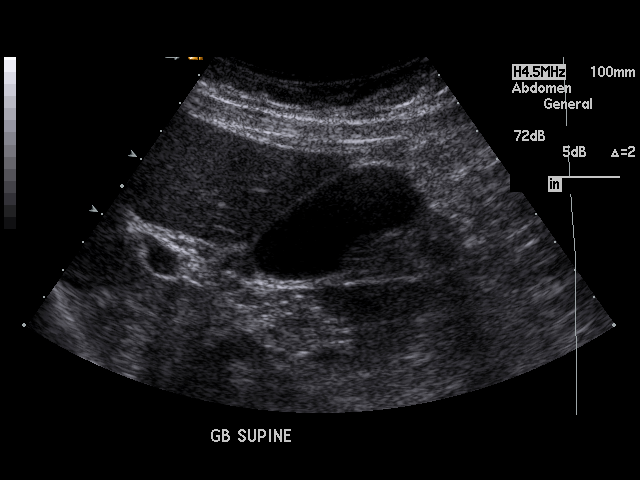
[im 43/94]
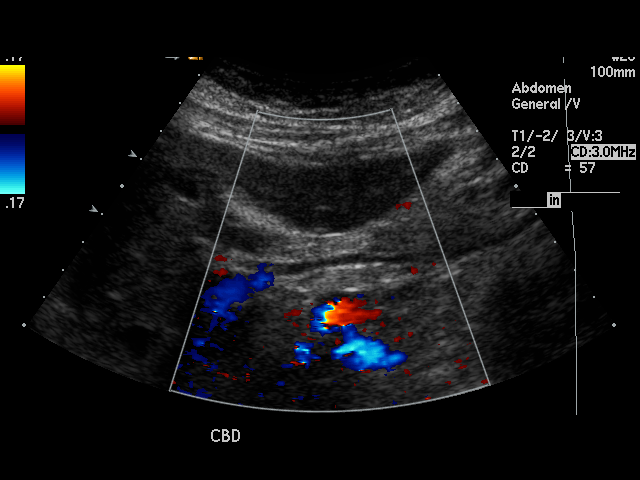
[im 47/94]
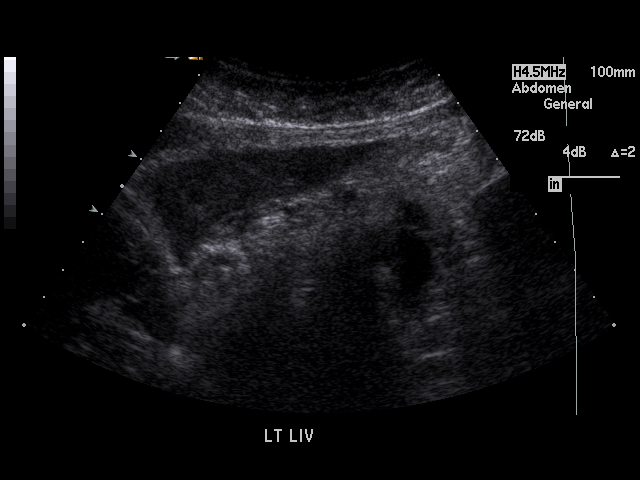
[im 51/94]
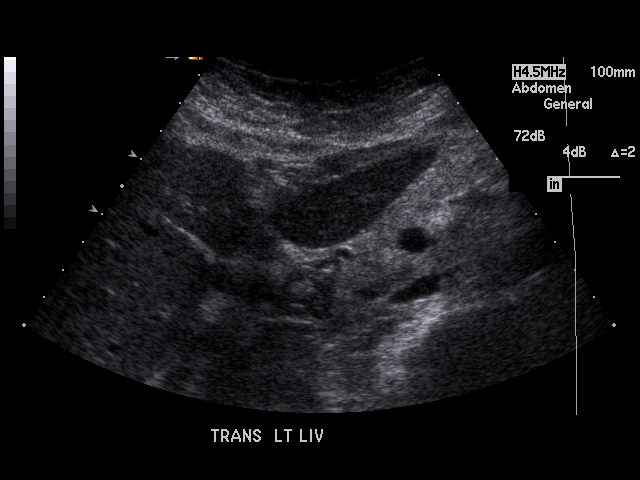
[im 59/94]
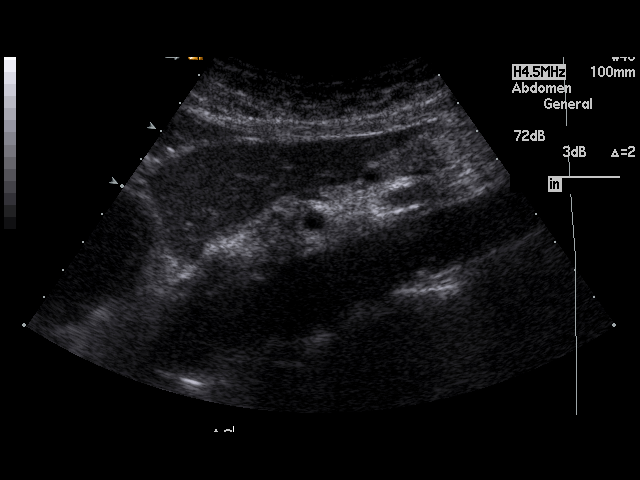
[im 63/94]
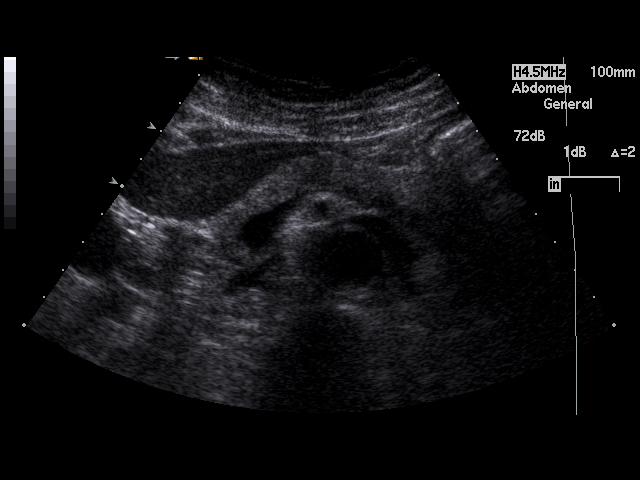
[im 70/94]
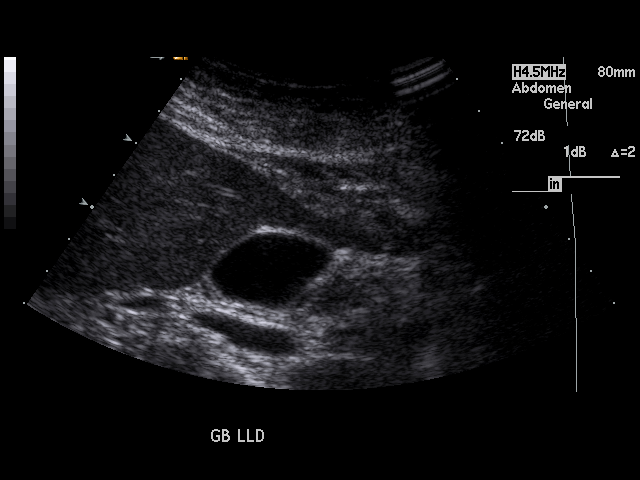
[im 74/94]
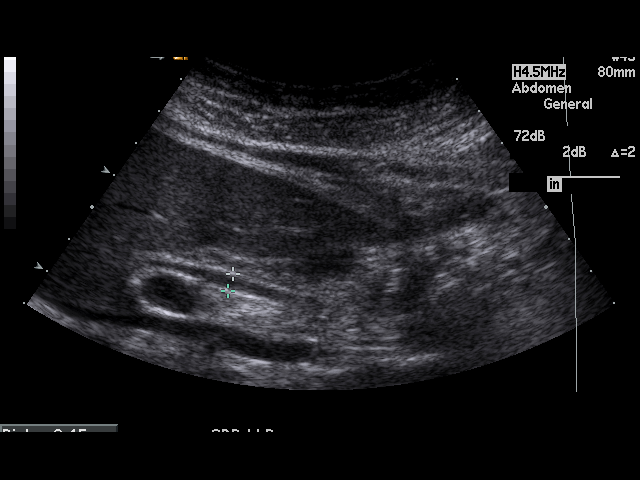
[im 82/94]
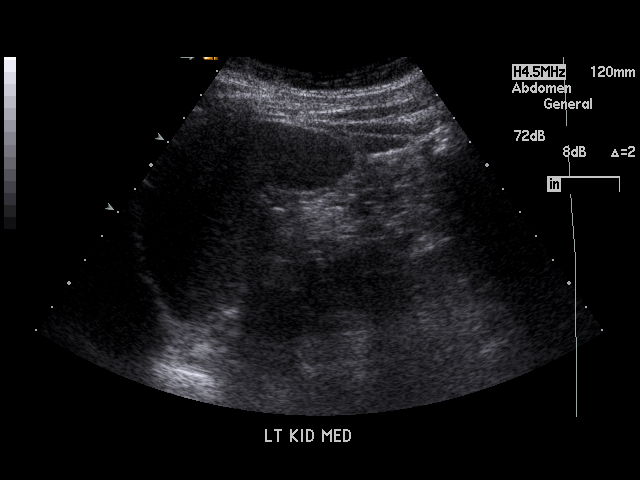
[im 86/94]
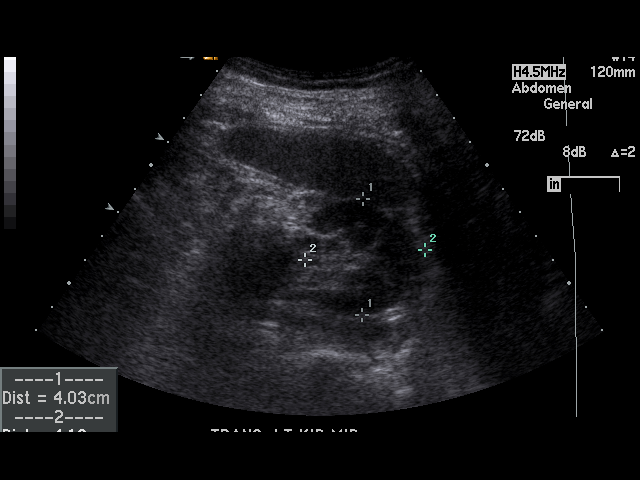
[im 94/94]
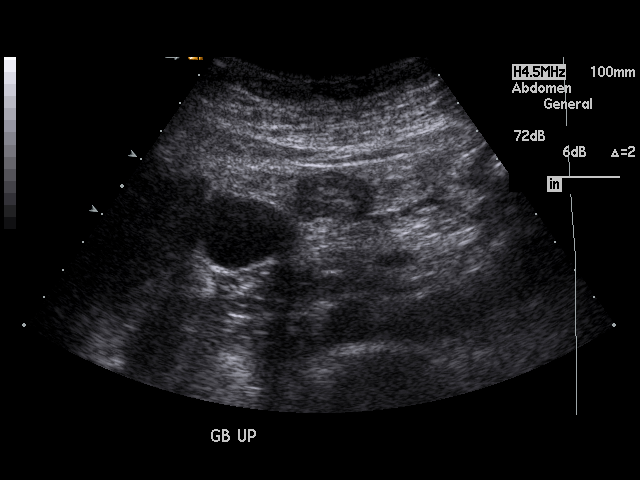

[17 of 25 positions shown; findings below may reference images not displayed]

PROCEDURE:     CORSON - CORSON ABDOMEN UPPER GENERAL  - May 06, 2011  [DATE]

RESULT:     There is a 1.99 cm cyst in the left lobe of the liver. The
hepatic echo pattern otherwise is normal in appearance. The pancreas,
spleen, abdominal aorta and inferior vena cava show no significant
abnormalities. No gallstones are seen. There is no thickening of the
gallbladder wall. Common bile duct measures 3.9 mm in diameter which is
within normal limits. The kidneys show no hydronephrosis. Sagittally, the
right kidney measures 9.86 cm and the left measures 11.62 cm.
IMPRESSION: 1. No gallstones or other acute change is identified.
2. There is a 1.99 cm cyst of the left lobe of the liver.

## 2012-08-21 IMAGING — US ULTRASOUND RIGHT BREAST
1 series · 8 of 8 positions shown · non-contrast
Comparison: none

REASON FOR EXAM: rt brst mass 7 oclock
COMMENTS:

[Series 1: ultrasound right breast · 8 of 8 slices shown]
[im 1/8]
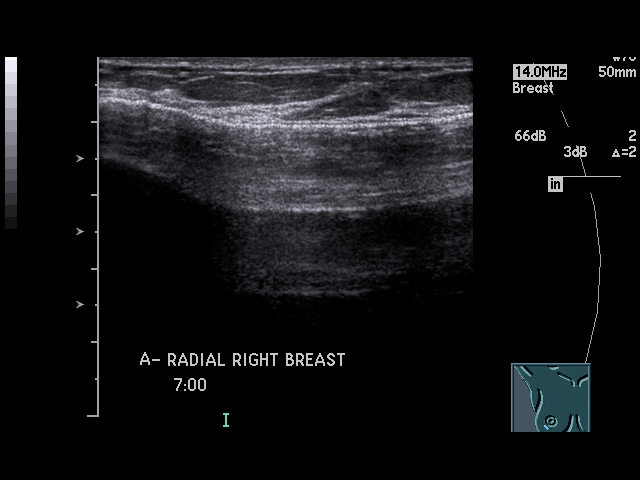
[im 2/8]
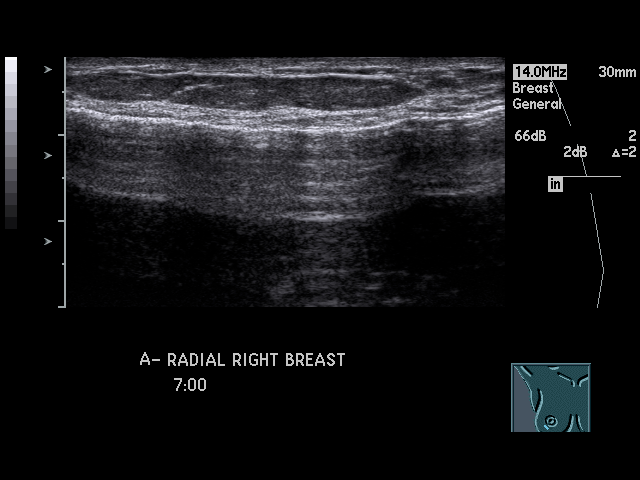
[im 3/8]
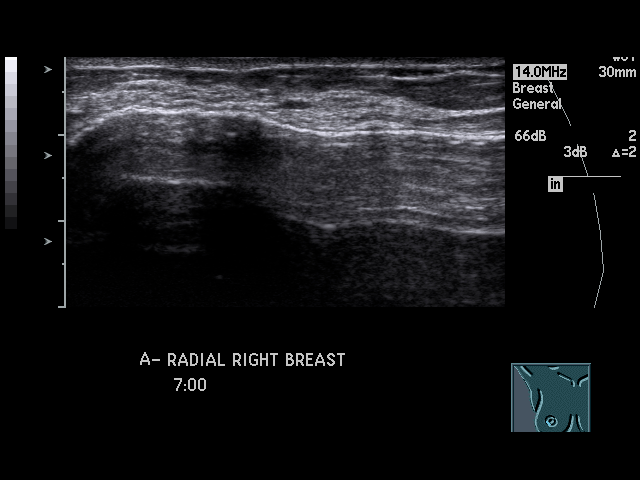
[im 4/8]
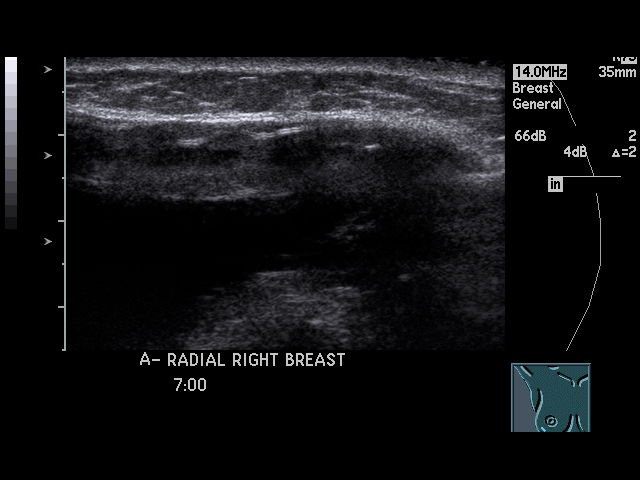
[im 5/8]
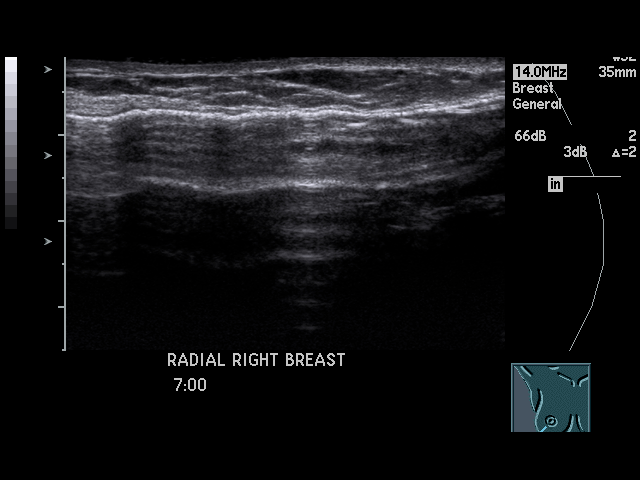
[im 6/8]
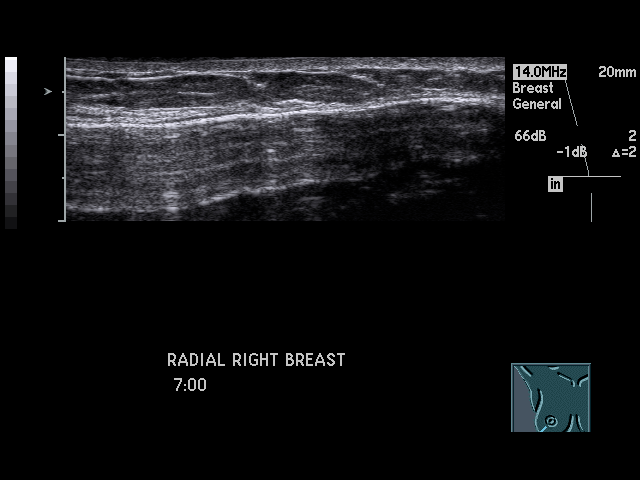
[im 7/8]
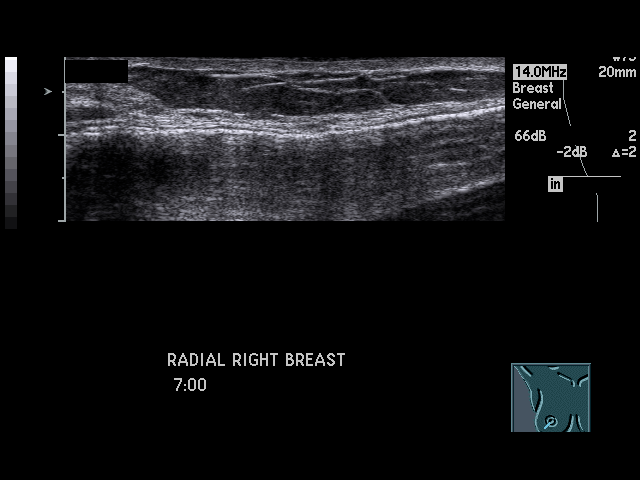
[im 8/8]
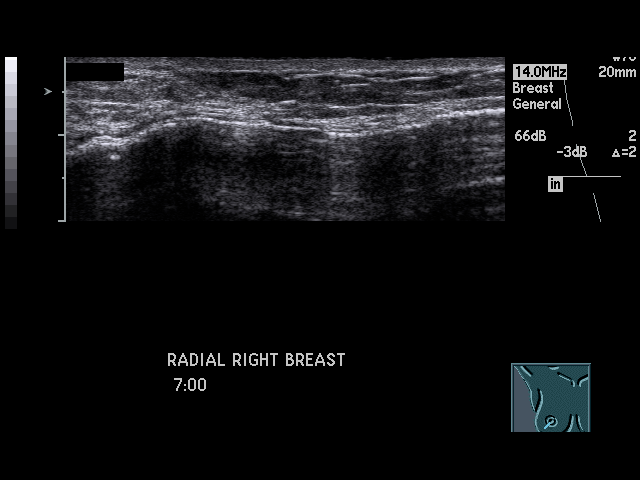

[8 of 8 positions shown; findings below may reference images not displayed]

PROCEDURE:     US  - US BREAST RIGHT  - May 11, 2011 [DATE]

RESULT:      Targeted ultrasound of the right breast is requested for a
possible mass in the 7 o'clock region. The area was scanned and showed no
solid or cystic mass or ductal dilation. No deformity of the architecture is
seen on the images submitted. The patient had concerns about an area in the
region of the nipple and at 5 o'clock but was told that only the area of
clinical concern by the physician could be evaluated at this time. It was
also suggested that the patient undergo screening mammogram since her most
recent study was in 4222.
IMPRESSION: Negative targeted right breast ultrasound in the 7 o'clock region. It is
recommended that the patient undergo screening mammographic exam or
diagnostic exam if there is a definite focal area of concern. The patient
was offered a mammogram today after the ultrasound and refused.

## 2012-08-30 ENCOUNTER — Encounter: Payer: Self-pay | Admitting: Family Medicine

## 2012-08-30 ENCOUNTER — Encounter: Payer: Self-pay | Admitting: Radiology

## 2012-08-30 ENCOUNTER — Ambulatory Visit (INDEPENDENT_AMBULATORY_CARE_PROVIDER_SITE_OTHER): Payer: Medicare Other | Admitting: Family Medicine

## 2012-08-30 VITALS — BP 142/66 | HR 92 | Temp 98.1°F | Wt 125.4 lb

## 2012-08-30 DIAGNOSIS — F341 Dysthymic disorder: Secondary | ICD-10-CM

## 2012-08-30 DIAGNOSIS — K5900 Constipation, unspecified: Secondary | ICD-10-CM

## 2012-08-30 DIAGNOSIS — F419 Anxiety disorder, unspecified: Secondary | ICD-10-CM

## 2012-08-30 DIAGNOSIS — M542 Cervicalgia: Secondary | ICD-10-CM

## 2012-08-30 DIAGNOSIS — E538 Deficiency of other specified B group vitamins: Secondary | ICD-10-CM

## 2012-08-30 DIAGNOSIS — F32A Depression, unspecified: Secondary | ICD-10-CM

## 2012-08-30 MED ORDER — VENLAFAXINE HCL 37.5 MG PO TABS: ORAL_TABLET | ORAL | Status: DC

## 2012-08-30 NOTE — Progress Notes (Signed)
Her anxiety is some better but she didn't think the effexor helped a lot recently.  "My mind still wanders a lot."    Neck pain. Started hurting about 2 weeks ago. It has gradually gotten worse.  Pain the R>L side.  She can move her head but it hurts to do so.  No trauma.  She had changed pillows about 1 month ago, but she didn't know if that was related.  She hasn't taken anything for the pain.  No arm symptoms.   She is occ using oxygen, this is an improvement from prev continuous use.    She is moving her bowels some better now.  She still has used MOM with some relief.  Last BM was day before yesterday.    Meds, vitals, and allergies reviewed.   ROS: See HPI.  Otherwise, noncontributory.  nad Thin Not on O2 No inc in wob Neck supple, w/o meningismus but pain on ROM at the R>L SCMs, no midline posterior or anterior neck pain rrr ctab abd soft Ext w/o edema No rash

## 2012-08-30 NOTE — Patient Instructions (Addendum)
I would change over to oral B12 a day.   I would try taking 17g (1 dose) of miralax per day if needed for constipation.  Use a heating pad and tylenol for you neck pain.  Try the old pillow and see if that helps.  I would take up to 4 of the effexor pills a day (up to 2 in the morning and 2 in the evening) for anxiety.  Go to the lab on the way out. Take care.  Let me know if the neck pain doesn't improve.

## 2012-08-31 DIAGNOSIS — M542 Cervicalgia: Secondary | ICD-10-CM | POA: Insufficient documentation

## 2012-08-31 NOTE — Assessment & Plan Note (Signed)
Likely benign MSK source.  Use a heating pad and tylenol for pain. She'll retry the old pillow and see if that helps.  F/u prn.

## 2012-08-31 NOTE — Assessment & Plan Note (Signed)
Would inc the effexor up to 75mg  BID.  She agrees.

## 2012-08-31 NOTE — Assessment & Plan Note (Signed)
Will try miralax prn for now.

## 2012-08-31 NOTE — Assessment & Plan Note (Signed)
She'll change to oral replacement; shortage on IM B12 currently.

## 2012-09-16 ENCOUNTER — Encounter: Payer: Self-pay | Admitting: Family Medicine

## 2012-09-19 IMAGING — CR DG ANKLE COMPLETE 3+V*R*
3 series · 3 of 3 positions shown · non-contrast
Comparison: None.

CLINICAL DATA: History of injury.

RIGHT ANKLE - COMPLETE 3+ VIEW

[view not recorded (1 of 3)]
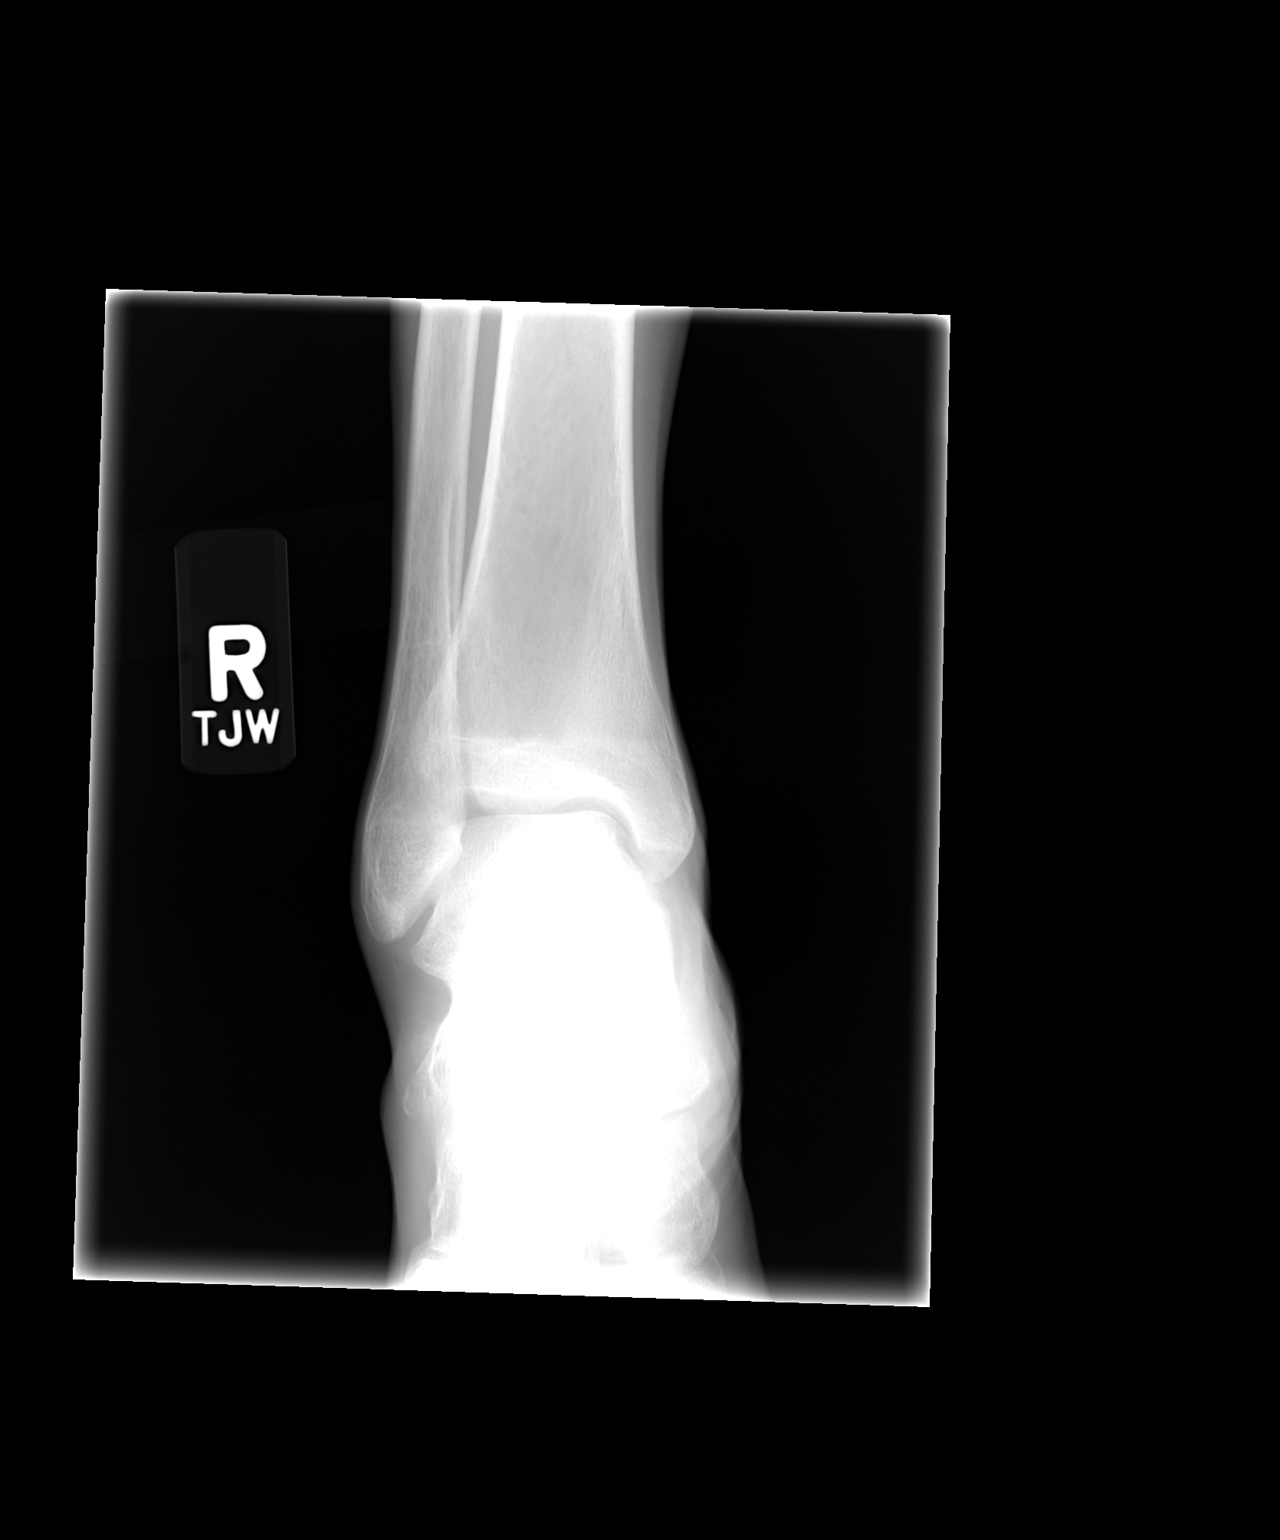

[view not recorded (2 of 3)]
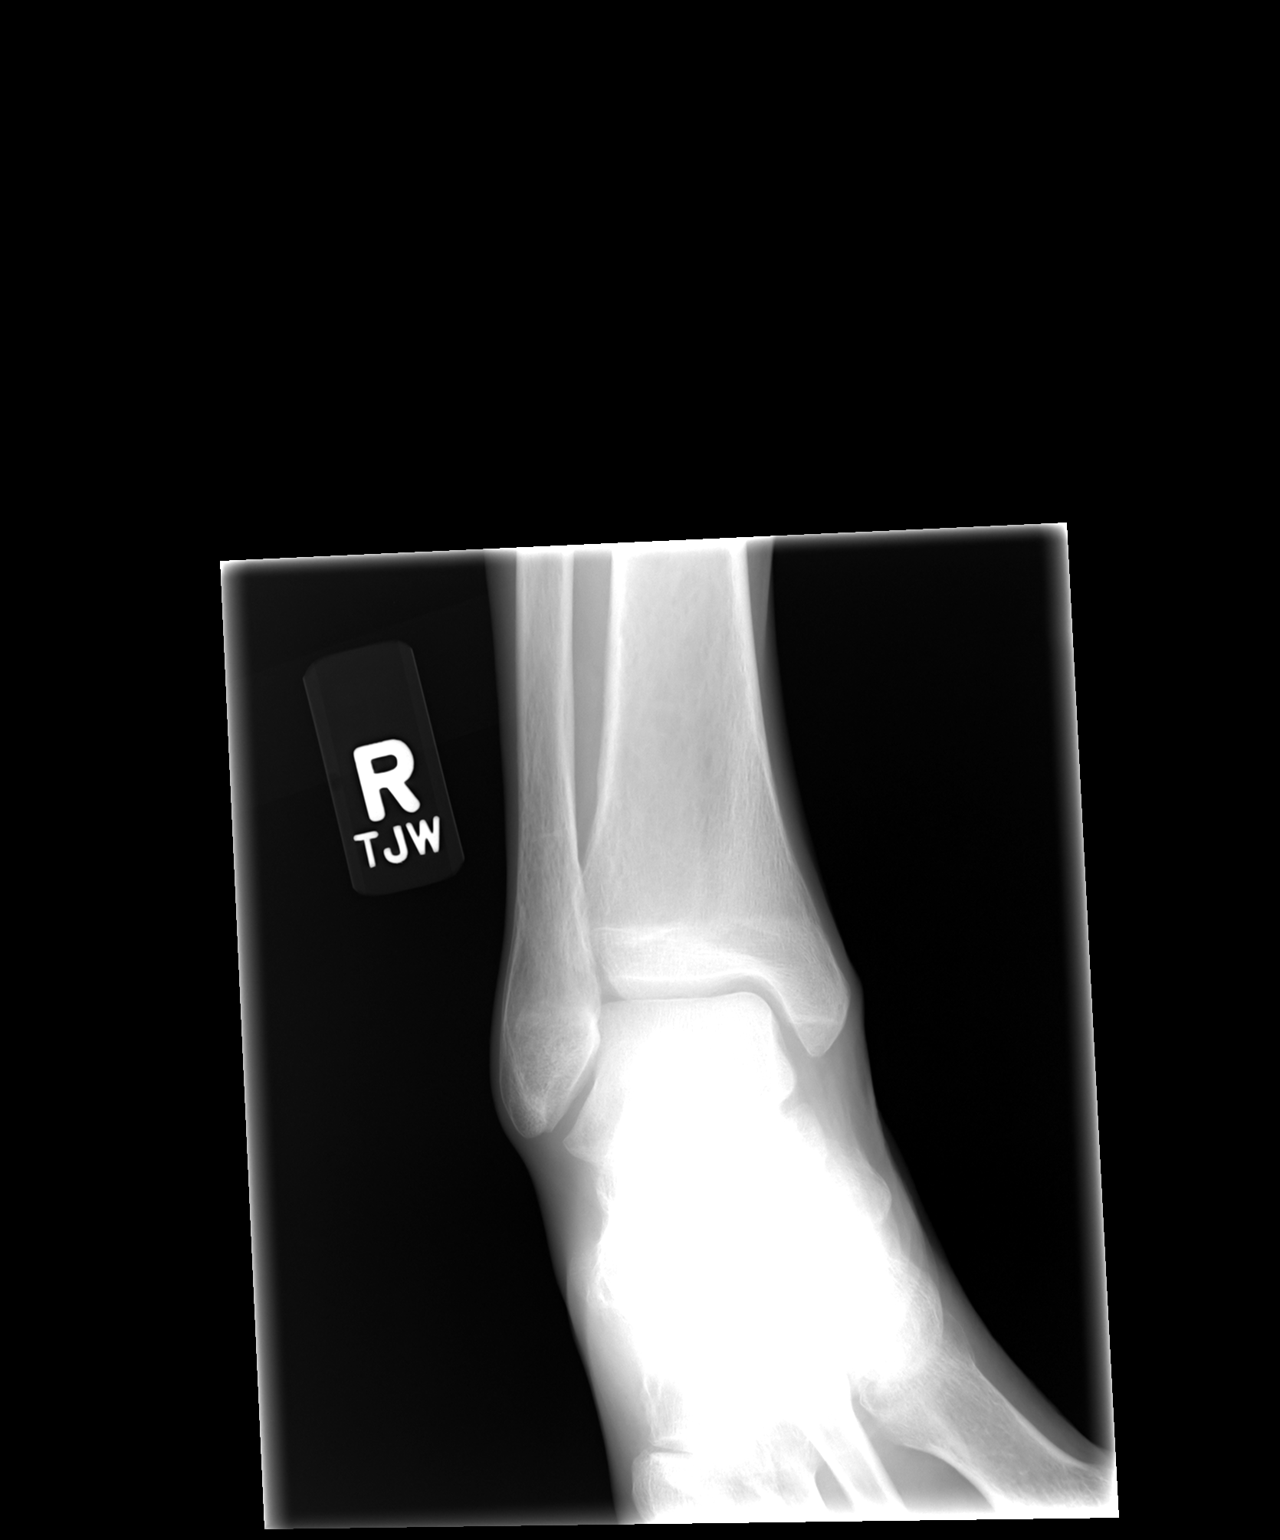

[view not recorded (3 of 3)]
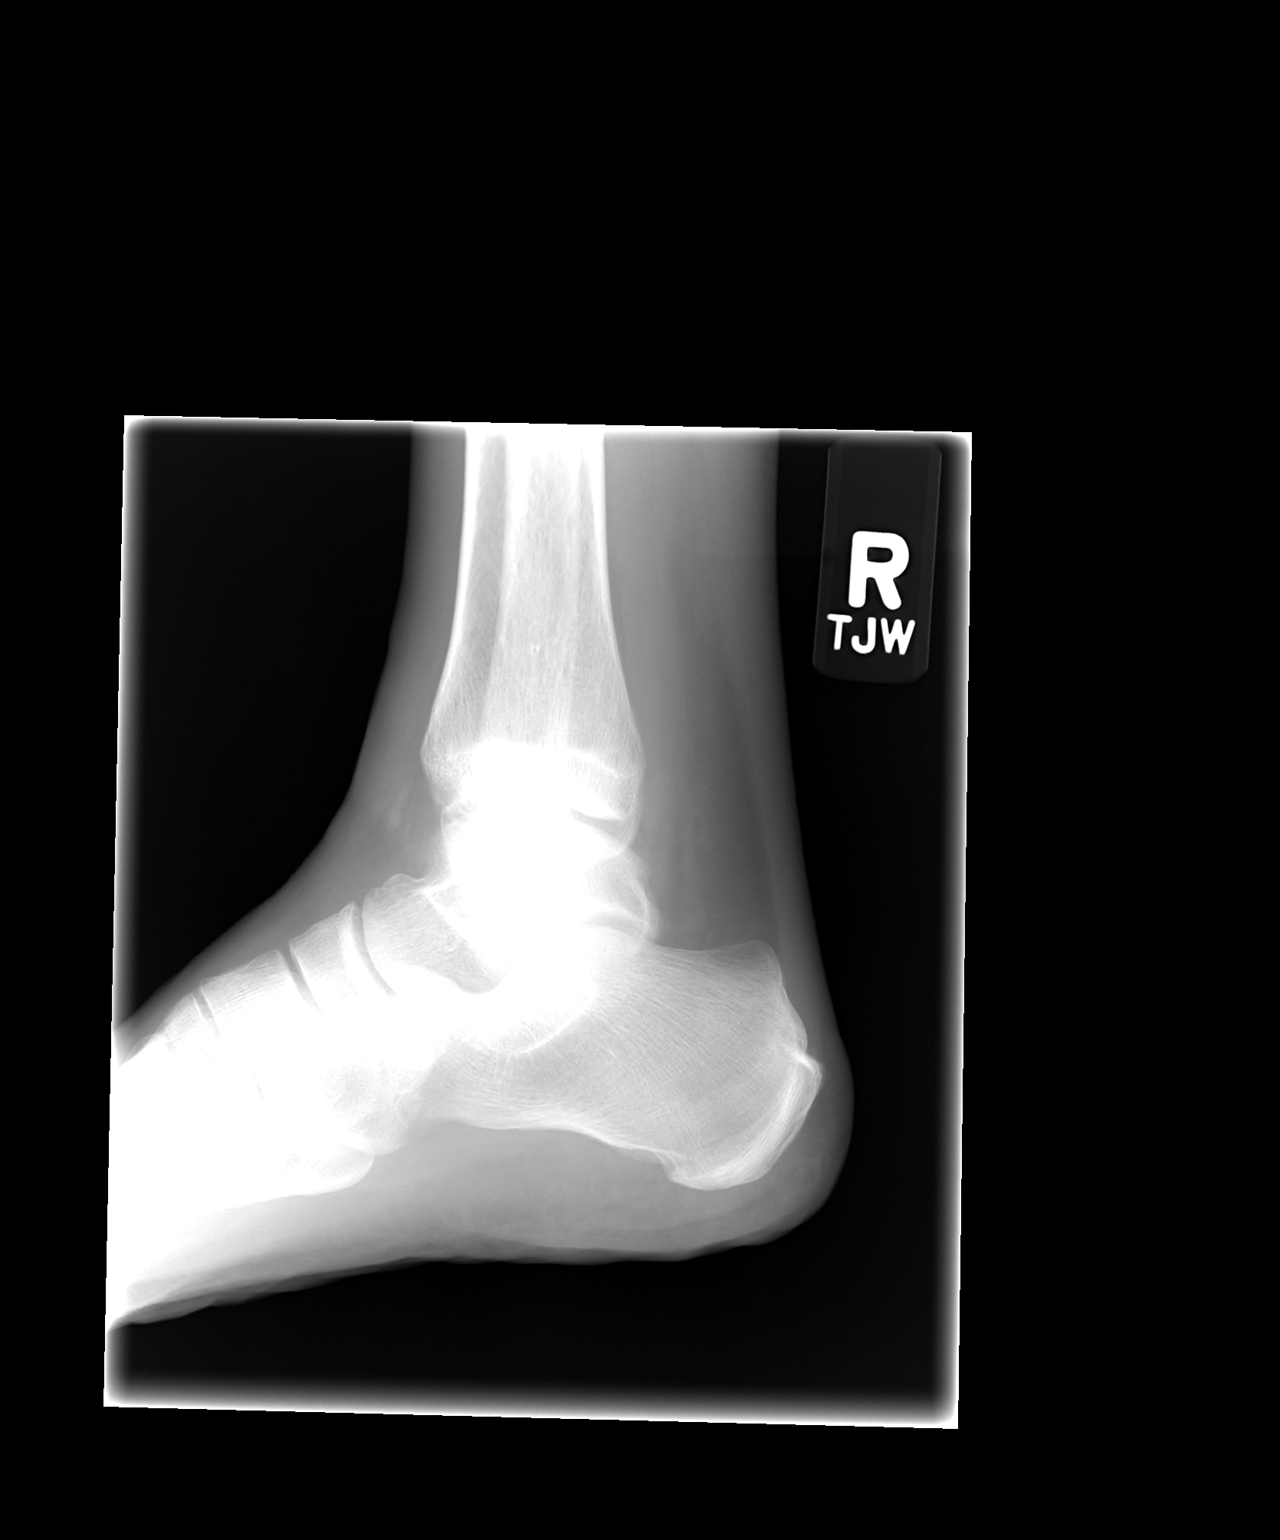

[3 of 3 positions shown; findings below may reference images not displayed]

FINDINGS: Mortise is preserved. Alignment is normal.  Joint spaces
are preserved.  No fracture or dislocation is evident.  No soft
tissue lesions are seen.
IMPRESSION: No fracture or dislocation.

## 2012-10-04 ENCOUNTER — Encounter: Payer: Self-pay | Admitting: Family Medicine

## 2012-10-04 ENCOUNTER — Ambulatory Visit (INDEPENDENT_AMBULATORY_CARE_PROVIDER_SITE_OTHER): Payer: Medicare Other | Admitting: Family Medicine

## 2012-10-04 VITALS — BP 156/82 | HR 72 | Temp 98.0°F | Wt 128.0 lb

## 2012-10-04 DIAGNOSIS — F329 Major depressive disorder, single episode, unspecified: Secondary | ICD-10-CM

## 2012-10-04 DIAGNOSIS — F341 Dysthymic disorder: Secondary | ICD-10-CM

## 2012-10-04 DIAGNOSIS — T148XXA Other injury of unspecified body region, initial encounter: Secondary | ICD-10-CM

## 2012-10-04 LAB — CBC WITH DIFFERENTIAL/PLATELET
Basophils Relative: 0.8 % (ref 0.0–3.0)
Eosinophils Absolute: 0.2 10*3/uL (ref 0.0–0.7)
Hemoglobin: 13.6 g/dL (ref 12.0–15.0)
MCHC: 33.3 g/dL (ref 30.0–36.0)
MCV: 87.8 fl (ref 78.0–100.0)
Monocytes Absolute: 0.5 10*3/uL (ref 0.1–1.0)
Neutro Abs: 5.2 10*3/uL (ref 1.4–7.7)
Neutrophils Relative %: 63.2 % (ref 43.0–77.0)
RBC: 4.65 Mil/uL (ref 3.87–5.11)

## 2012-10-04 MED ORDER — ALPRAZOLAM 0.5 MG PO TABS: 0.5000 mg | ORAL_TABLET | Freq: Two times a day (BID) | ORAL | Status: DC | PRN

## 2012-10-04 MED ORDER — VENLAFAXINE HCL 37.5 MG PO TABS: 37.5000 mg | ORAL_TABLET | Freq: Two times a day (BID) | ORAL | Status: DC

## 2012-10-04 MED ORDER — ASPIRIN 325 MG PO TBEC: 325.0000 mg | DELAYED_RELEASE_TABLET | ORAL | Status: DC

## 2012-10-04 NOTE — Patient Instructions (Addendum)
Go to the lab on the way out.  We'll contact you with your lab report.  Cut the aspirin back to every other day.  See if that helps your arms.  Change from lorazepam to xanax, use it as needed.  Keep taking the venlafaxine.  Take care.

## 2012-10-04 NOTE — Progress Notes (Signed)
She went back to her old pillow and that may have helped her neck pain some.    Nerves "are still bad, still crying some."  She thinks xanax helped more than lorazepam.  We discussed changing back over.  She agrees with the change.    Red spots on bilateral arms. Present for about 1-2 weeks.  Not tender or itchy.  No clear trigger.  No other similar lesions on the trunk or legs. Taking 325mg  ASA a day.  No fevers, no tick bites. Only on the B extensor surfaces of the B arms, nowhere else.   Meds, vitals, and allergies reviewed.   ROS: See HPI.  Otherwise, noncontributory.  nad Thin, not acutely ill appearing On O2 ncat Mmm No oral lesions Neck supple rrr Equal breath sounds in the chest, no inc work of breathing No rash except for dark red macules with irregular shape on the extensor side of arms B Thin skin noted No palmar lesions, no other similar lesion on the legs or trunk

## 2012-10-05 DIAGNOSIS — T148XXA Other injury of unspecified body region, initial encounter: Secondary | ICD-10-CM | POA: Insufficient documentation

## 2012-10-05 NOTE — Assessment & Plan Note (Signed)
Looks like bruising, related to ASA, thin skin.  Cut back to qod dosing and see notes on CBC, normal. Call back as needed. She may need 81mg  qod.  She agrees.  Nontoxic.

## 2012-10-05 NOTE — Assessment & Plan Note (Signed)
Continue effexor but change back to xanax.  She agrees.  Update me as needed.

## 2012-10-27 ENCOUNTER — Emergency Department: Payer: Self-pay | Admitting: Emergency Medicine

## 2012-10-27 LAB — CBC
MCH: 29.9 pg (ref 26.0–34.0)
MCHC: 34.2 g/dL (ref 32.0–36.0)
RBC: 4.87 10*6/uL (ref 3.80–5.20)
RDW: 15.6 % — ABNORMAL HIGH (ref 11.5–14.5)
WBC: 6.2 10*3/uL (ref 3.6–11.0)

## 2012-10-27 LAB — BASIC METABOLIC PANEL
Calcium, Total: 9.9 mg/dL (ref 8.5–10.1)
Chloride: 103 mmol/L (ref 98–107)
EGFR (African American): >60
EGFR (Non-African Amer.): >60
Glucose: 86 mg/dL (ref 65–99)
Osmolality: 276 (ref 275–301)
Sodium: 138 mmol/L (ref 136–145)

## 2012-11-22 ENCOUNTER — Encounter: Payer: Self-pay | Admitting: Family Medicine

## 2012-11-22 ENCOUNTER — Ambulatory Visit (INDEPENDENT_AMBULATORY_CARE_PROVIDER_SITE_OTHER): Payer: Medicare Other | Admitting: Family Medicine

## 2012-11-22 VITALS — BP 144/80 | HR 60 | Temp 97.6°F | Wt 134.8 lb

## 2012-11-22 DIAGNOSIS — H6120 Impacted cerumen, unspecified ear: Secondary | ICD-10-CM

## 2012-11-22 DIAGNOSIS — R109 Unspecified abdominal pain: Secondary | ICD-10-CM

## 2012-11-22 DIAGNOSIS — T148XXA Other injury of unspecified body region, initial encounter: Secondary | ICD-10-CM

## 2012-11-22 DIAGNOSIS — H6123 Impacted cerumen, bilateral: Secondary | ICD-10-CM

## 2012-11-22 NOTE — Patient Instructions (Addendum)
Let me know if the bruising on your arms gets worse.  We'll be in touch about imaging your abdomen.   Take care.

## 2012-11-23 DIAGNOSIS — R109 Unspecified abdominal pain: Secondary | ICD-10-CM | POA: Insufficient documentation

## 2012-11-23 NOTE — Assessment & Plan Note (Signed)
Persisting, since the PTX.  Would check abd [place].  Nontoxic and I don't suspect acute process.  I would start with u/s first.   LFTs wnl in the interval and no jaundice, pain not localized to the RUQ.

## 2012-11-23 NOTE — Progress Notes (Signed)
Small bruising noted on arms, extensor forearms.  No bleeding.  Wanted eval.   B ears feel plugged.  No pain.  H/o cerumen impaction.   Still with upper abd pain, since the PTX prev.  No acute changes.  No bowel sx.  No change with food.  No blood in stool.  No vomiting.  No fevers.   Meds, vitals, and allergies reviewed.   ROS: See HPI.  Otherwise, noncontributory.  nad ncat B cerumen impaction, partially removed with irrigation Nasal and OP exam wnl Neck supple no LA rrr ctab abd soft except for slightly ttp just deep to the skin on the upper abd, just to R of midline.  Ribs not ttp Not ttp in the RUQ focally.  No rebound, normal BS Small scattered bruising on the arms as above.

## 2012-11-23 NOTE — Assessment & Plan Note (Signed)
Improved, continue home tx with irrigation.

## 2012-11-23 NOTE — Assessment & Plan Note (Signed)
ASA, thin skin, small amount.  Reassured.

## 2012-11-28 DIAGNOSIS — S069X9A Unspecified intracranial injury with loss of consciousness of unspecified duration, initial encounter: Secondary | ICD-10-CM

## 2012-11-28 HISTORY — DX: Unspecified intracranial injury with loss of consciousness of unspecified duration, initial encounter: S06.9X9A

## 2012-12-01 ENCOUNTER — Ambulatory Visit: Payer: Self-pay | Admitting: Family Medicine

## 2012-12-02 ENCOUNTER — Encounter: Payer: Self-pay | Admitting: Family Medicine

## 2012-12-21 ENCOUNTER — Ambulatory Visit: Payer: Medicare Other

## 2012-12-21 ENCOUNTER — Ambulatory Visit (INDEPENDENT_AMBULATORY_CARE_PROVIDER_SITE_OTHER): Payer: Medicare Other | Admitting: Internal Medicine

## 2012-12-21 ENCOUNTER — Encounter: Payer: Self-pay | Admitting: Internal Medicine

## 2012-12-21 VITALS — BP 148/90 | HR 78 | Temp 97.6°F | Wt 139.8 lb

## 2012-12-21 DIAGNOSIS — M25569 Pain in unspecified knee: Secondary | ICD-10-CM

## 2012-12-21 DIAGNOSIS — W19XXXA Unspecified fall, initial encounter: Secondary | ICD-10-CM

## 2012-12-21 DIAGNOSIS — M25562 Pain in left knee: Secondary | ICD-10-CM

## 2012-12-21 DIAGNOSIS — N644 Mastodynia: Secondary | ICD-10-CM

## 2012-12-21 DIAGNOSIS — Y92009 Unspecified place in unspecified non-institutional (private) residence as the place of occurrence of the external cause: Secondary | ICD-10-CM

## 2012-12-21 DIAGNOSIS — Z23 Encounter for immunization: Secondary | ICD-10-CM

## 2012-12-21 NOTE — Patient Instructions (Signed)
RICE: Routine Care for Injuries  The routine care of many injuries includes Rest, Ice, Compression, and Elevation (RICE).  HOME CARE INSTRUCTIONS   Rest is needed to allow your body to heal. Routine activities can usually be resumed when comfortable. Injured tendons and bones can take up to 6 weeks to heal. Tendons are the cord-like structures that attach muscle to bone.   Ice following an injury helps keep the swelling down and reduces pain.   Put ice in a plastic bag.   Place a towel between your skin and the bag.   Leave the ice on for 15-20 minutes, 3-4 times a day. Do this while awake, for the first 24 to 48 hours. After that, continue as directed by your caregiver.   Compression helps keep swelling down. It also gives support and helps with discomfort. If an elastic bandage has been applied, it should be removed and reapplied every 3 to 4 hours. It should not be applied tightly, but firmly enough to keep swelling down. Watch fingers or toes for swelling, bluish discoloration, coldness, numbness, or excessive pain. If any of these problems occur, remove the bandage and reapply loosely. Contact your caregiver if these problems continue.   Elevation helps reduce swelling and decreases pain. With extremities, such as the arms, hands, legs, and feet, the injured area should be placed near or above the level of the heart, if possible.  SEEK IMMEDIATE MEDICAL CARE IF:   You have persistent pain and swelling.   You develop redness, numbness, or unexpected weakness.   Your symptoms are getting worse rather than improving after several days.  These symptoms may indicate that further evaluation or further X-rays are needed. Sometimes, X-rays may not show a small broken bone (fracture) until 1 week or 10 days later. Make a follow-up appointment with your caregiver. Ask when your X-ray results will be ready. Make sure you get your X-ray results.  Document Released: 06/28/2000 Document Revised: 06/08/2011 Document  Reviewed: 08/15/2010  ExitCare Patient Information 2014 [employer].

## 2012-12-21 NOTE — Progress Notes (Signed)
Subjective:    Patient ID: Mckenzie Mclaughlin, female    DOB: Aug 08, 1940, 72 y.o.   MRN: [id number]  HPI  Pt presents to the clinic today wit hc/o a right breast and right knee pain secondary to a fall that occurred on 9/7. Shew was coming out of the bathroom when she fell. She did not trip. She reports that felt dizzy during that time. She denies chest pain or shortness of breath. This is the first time that has happened. EMS did evaluate her at the scene and everything was normal. Since that time, she has had right breast pain and left knee pain. The pain is worse with movement. She has not taken anything OTC for it.  Review of Systems      Past Medical History  Diagnosis Date  . COPD, severe 07/29/98  . Coronary artery disease 01/22/99    Non Q-wave MI, stent RCA  . GERD (gastroesophageal reflux disease) 08/29/98    severe  . Hypertension     03/30/00  . History of ETT 08/03/09    Myoview Normal  . History of ETT 05/1999    Cardiolite pos ETT, neg Cardiolite  . Hx of cardiac catheterization 08/29/01    60% RCA 0/w 20-30% lesions  . Hx of cardiac catheterization 01/22/99    w/Stent 90% RCA lesion  . History of ETT 09/02/01    Normal  . History of ETT 04/28/2006    Myoview normal EF 83%  . History of CT scan of head 08/02/09    w/o mild age appropr atrophy  . History of blurry vision 05/06-05/10/2009    Hospital [facility] CP R/O'D Blurry vision, smoking  . History of ETT 04/10/11    normal EF and no ischemia per Dr. Darrold Junker  . COPD (chronic obstructive pulmonary disease)   . Anxiety   . Shortness of breath   . Pneumothorax   . Esophageal dilatation     Pt needs appple sauce to take meds.  . Esophageal stricture     PT needs apple sauce to take meds    Current Outpatient Prescriptions  Medication Sig Dispense Refill  . albuterol (PROVENTIL HFA;VENTOLIN HFA) 108 (90 BASE) MCG/ACT inhaler Inhale 2 puffs into the lungs 4 (four) times daily as needed for wheezing or shortness  of breath.      Marland Kitchen aspirin 81 MG tablet Take 81 mg by mouth daily.      . budesonide-formoterol (SYMBICORT) 160-4.5 MCG/ACT inhaler Inhale 2 puffs into the lungs 2 (two) times daily.      Marland Kitchen LORazepam (ATIVAN) 1 MG tablet Take 1 mg by mouth 2 (two) times daily.      . metoprolol succinate (TOPROL-XL) 50 MG 24 hr tablet Take 50 mg by mouth daily. Take with or immediately following a meal.      . simvastatin (ZOCOR) 10 MG tablet Take 10 mg by mouth at bedtime.      Marland Kitchen tiotropium (SPIRIVA HANDIHALER) 18 MCG inhalation capsule Place 18 mcg into inhaler and inhale at bedtime.       Marland Kitchen venlafaxine (EFFEXOR) 37.5 MG tablet Take 1 tablet (37.5 mg total) by mouth 2 (two) times daily.      . [DISCONTINUED] clonazePAM (KLONOPIN) 0.5 MG tablet Take 0.5 tablets (0.25 mg total) by mouth 2 (two) times daily as needed for anxiety.  30 tablet  0   No current facility-administered medications for this visit.    Allergies  Allergen Reactions  . Amoxicillin Other (See Comments)  Shortness of breath.  . Sertraline Hcl     REACTION: trembling    Family History  Problem Relation Age of Onset  . Stroke Mother   . Heart disease Mother     MI  . Cancer Father     Lung  . COPD Brother   . Cancer Brother     Lung with mets 2011  . Heart disease Brother     CAD  . Heart disease Sister     cirrhosis due heart disease  . Cirrhosis Sister   . Colon cancer Neg Hx     History   Social History  . Marital Status: Married    Spouse Name: N/A    Number of Children: 0  . Years of Education: N/A   Occupational History  . Retired/Disability     Musician Dept [employer]   Social History Main Topics  . Smoking status: Former Smoker -- 0.40 packs/day for 50 years    Types: Cigarettes    Quit date: 04/10/2012  . Smokeless tobacco: Never Used     Comment: 1/4 PPD as of 2012  . Alcohol Use: No  . Drug Use: No  . Sexual Activity: Not on file   Other Topics Concern  . Not on file    Social History Narrative   Retired Disability 2004 Lung disease   Married 1962   Rides [place] motorcycle with husband     Constitutional: Denies fever, malaise, fatigue, headache or abrupt weight changes.  Respiratory: Denies difficulty breathing, shortness of breath, cough or sputum production.   Cardiovascular: Denies chest pain, chest tightness, palpitations or swelling in the hands or feet.  Musculoskeletal: Pt reports knee pain. Denies decrease in range of motion, difficulty with gait, muscle pain or joint pain and swelling.  Skin: Pt reports bruising of right breast. Denies redness, rashes, lesions or ulcercations.  Neurological: Denies dizziness, difficulty with memory, difficulty with speech or problems with balance and coordination.   No other specific complaints in a complete review of systems (except as listed in HPI above).  Objective:   Physical Exam   BP 148/90  Pulse 78  Temp(Src) 97.6 F (36.4 C) (Oral)  Wt 139 lb 12 oz (63.39 kg)  BMI 21.88 kg/m2  SpO2 98% Wt Readings from Last 3 Encounters:  12/21/12 139 lb 12 oz (63.39 kg)  11/22/12 134 lb 12 oz (61.122 kg)  10/04/12 128 lb (58.06 kg)    General: Appears her stated age, well developed, well nourished in NAD. Skin: Warm, dry and intact. Small abrasion to left anterior knee, almost healed. Large hematoma noted of right breast. Cardiovascular: Normal rate and rhythm. S1,S2 noted.  No murmur, rubs or gallops noted. No JVD or BLE edema. No carotid bruits noted. Pulmonary/Chest: Normal effort and positive vesicular breath sounds. No respiratory distress. No wheezes, rales or ronchi noted.  Musculoskeletal: Normal range of motion. No signs of joint swelling. No difficulty with gait.  Neurological: Alert and oriented. Cranial nerves II-XII intact. Coordination normal. +DTRs bilaterally.   BMET    Component Value Date/Time   NA 140 07/09/2012 0934   K 5.0 07/09/2012 0934   CL 104 07/09/2012 0934   CO2 30  07/09/2012 0934   GLUCOSE 96 07/09/2012 0934   BUN 11 07/09/2012 0934   CREATININE 0.77 07/09/2012 0934   CALCIUM 10.4 07/09/2012 0934   GFRNONAA 84* 07/09/2012 0934   GFRAA >90 07/09/2012 0934    Lipid Panel     Component  Value Date/Time   CHOL 124 02/18/2011 0856   TRIG 75.0 02/18/2011 0856   HDL 52.20 02/18/2011 0856   CHOLHDL 2 02/18/2011 0856   VLDL 15.0 02/18/2011 0856   LDLCALC 57 02/18/2011 0856    CBC    Component Value Date/Time   WBC 8.2 10/04/2012 1437   RBC 4.65 10/04/2012 1437   HGB 13.6 10/04/2012 1437   HCT 40.8 10/04/2012 1437   PLT 254.0 10/04/2012 1437   MCV 87.8 10/04/2012 1437   MCH 30.4 07/09/2012 0934   MCHC 33.3 10/04/2012 1437   RDW 14.2 10/04/2012 1437   LYMPHSABS 2.2 10/04/2012 1437   MONOABS 0.5 10/04/2012 1437   EOSABS 0.2 10/04/2012 1437   BASOSABS 0.1 10/04/2012 1437    Hgb A1C No results found for this basename: HGBA1C        Assessment & Plan:   Right breast pain secondary to fall, with extensive hematoma:  Given the fact that she has silicone implants, I would like to get ultrasound of right breast to r/o puncture Likely just hematoma which will resolve over time Tylenol as needed for pain Neosporin for left knee abrasion  Monitor for increased pain in right breast, fluid leaking for the nipple or recurrent falls

## 2012-12-21 NOTE — Addendum Note (Signed)
Addended by: Criselda Peaches B on: 12/21/2012 04:15 PM   Modules accepted: Orders

## 2012-12-21 NOTE — Addendum Note (Signed)
Addended by: Lorre Munroe on: 12/21/2012 04:10 PM   Modules accepted: Orders

## 2012-12-30 ENCOUNTER — Encounter: Payer: Self-pay | Admitting: Internal Medicine

## 2012-12-30 ENCOUNTER — Ambulatory Visit: Payer: Self-pay

## 2012-12-30 ENCOUNTER — Other Ambulatory Visit: Payer: Self-pay | Admitting: Family Medicine

## 2012-12-30 ENCOUNTER — Other Ambulatory Visit: Payer: Self-pay | Admitting: Internal Medicine

## 2012-12-30 DIAGNOSIS — T8543XD Leakage of breast prosthesis and implant, subsequent encounter: Secondary | ICD-10-CM

## 2012-12-30 DIAGNOSIS — Z9013 Acquired absence of bilateral breasts and nipples: Secondary | ICD-10-CM

## 2012-12-30 NOTE — Progress Notes (Signed)
Mckenzie Mclaughlin with [facility] [phone] is on phone and pt is there for rt mammogram; Mckenzie Mclaughlin said last bilateral diagnostic mammogram done 05/2011. Christie request order for bilateral diag mammo.Please advise. Dr Para March said give verbal to do bil diag mammo and he will send order. Christie notified as instructed and voiced understanding.

## 2012-12-30 NOTE — Progress Notes (Signed)
Ordered. Thanks

## 2013-01-03 ENCOUNTER — Other Ambulatory Visit: Payer: Self-pay

## 2013-01-03 ENCOUNTER — Telehealth: Payer: Self-pay

## 2013-01-03 DIAGNOSIS — Z9882 Breast implant status: Secondary | ICD-10-CM

## 2013-01-03 MED ORDER — LORAZEPAM 1 MG PO TABS: 1.0000 mg | ORAL_TABLET | Freq: Two times a day (BID) | ORAL | Status: DC

## 2013-01-03 NOTE — Telephone Encounter (Signed)
Pt saw Dr Sherald Hess plastic surgeon in [place]; pt said Dr Sherald Hess wants to remove implant from rt breast and replace with another implant. Pt wants implant removed but does not want to have another implant put back in rt breast. Pt wants 2nd opinion of another plastic surgeon.

## 2013-01-03 NOTE — Telephone Encounter (Signed)
Pt left v/m requesting refill lorazepam to [facility] [address].Please advise.  

## 2013-01-03 NOTE — Telephone Encounter (Signed)
Please call in

## 2013-01-03 NOTE — Telephone Encounter (Signed)
Medication phoned to pharmacy.  

## 2013-01-04 ENCOUNTER — Telehealth: Payer: Self-pay

## 2013-01-04 NOTE — Telephone Encounter (Signed)
Referral placed.

## 2013-01-04 NOTE — Telephone Encounter (Signed)
I talked to Dr Sherald Hess.  I will await the second opinion and then we can do preop eval if/as needed.  I thanked her for the call.

## 2013-01-04 NOTE — Telephone Encounter (Signed)
Called and left message with staff.  Explained that I would be glad to talk.  Noted that pt wanted second opinion.   Will await call back from Dr Sherald Hess.

## 2013-01-04 NOTE — Telephone Encounter (Signed)
Dr Sherald Hess request call back at [phone]; pt has bad COPD and is on oxygen. Pt has ruptured silicone implant. Dr Sherald Hess wants to discuss with Dr Para March how much COPD may effect anesthesia for pts 2 hour procedure. Dr Para March please return call.

## 2013-01-05 DIAGNOSIS — J439 Emphysema, unspecified: Secondary | ICD-10-CM | POA: Insufficient documentation

## 2013-01-05 DIAGNOSIS — Z8709 Personal history of other diseases of the respiratory system: Secondary | ICD-10-CM | POA: Insufficient documentation

## 2013-01-05 DIAGNOSIS — K219 Gastro-esophageal reflux disease without esophagitis: Secondary | ICD-10-CM | POA: Insufficient documentation

## 2013-01-05 DIAGNOSIS — I2510 Atherosclerotic heart disease of native coronary artery without angina pectoris: Secondary | ICD-10-CM | POA: Insufficient documentation

## 2013-01-05 HISTORY — DX: Emphysema, unspecified: J43.9

## 2013-01-05 HISTORY — DX: Personal history of other diseases of the respiratory system: Z87.09

## 2013-01-05 NOTE — Telephone Encounter (Signed)
2nd Opinion appt made with Dr Marzetta Board with [facility] in [place], patient told to bring her films from [facility] and reports and office notes faxed to Dr Marzetta Board.

## 2013-01-25 ENCOUNTER — Telehealth: Payer: Self-pay

## 2013-01-25 NOTE — Telephone Encounter (Signed)
Usually the surgery team has input on this.  This issue would be the aspirin.  I would see what surgery recommends.  The other issue is her COPD status.  We would likely need to get her eval done by pulmonary prep op.  If she is going to have surgery, then let me know so we can set this up.  Thanks.

## 2013-01-25 NOTE — Telephone Encounter (Signed)
Thanks

## 2013-01-25 NOTE — Telephone Encounter (Signed)
Left message on answering machine to call back.

## 2013-01-25 NOTE — Telephone Encounter (Signed)
Pt left vm requesting any meds Dr Para March would have pt stop prior to removal of rt breast implant surgery.Please advise.

## 2013-01-25 NOTE — Telephone Encounter (Signed)
Patient notified as instructed by telephone. Was advised by patient that she already sees a lung doctor (Dr. Meredeth Ide). Patient stated that she will call and set up an appointment with him.

## 2013-01-30 ENCOUNTER — Encounter (HOSPITAL_BASED_OUTPATIENT_CLINIC_OR_DEPARTMENT_OTHER): Payer: Self-pay | Admitting: *Deleted

## 2013-01-30 NOTE — Progress Notes (Signed)
Pt has severe copd-stopped smoking 1/14-uses 02 as needed if walks much-she had a lung bx 1/14 and had a pneumo rt lung-was in hospital 2 months with a chest tube-came to [facility] and dr Burnis Medin fixed the Heath Gold has been doing better-sees her pulm dr Lucia Bitter 02/01/13 for clearance, will review with anesthesia.

## 2013-01-31 ENCOUNTER — Encounter (HOSPITAL_COMMUNITY): Payer: Self-pay

## 2013-01-31 NOTE — Progress Notes (Signed)
Reviewed case with dr Massagee-agreeded pt needed to be done Main or.Office called

## 2013-02-02 ENCOUNTER — Encounter (HOSPITAL_COMMUNITY): Payer: Self-pay | Admitting: *Deleted

## 2013-02-02 MED ORDER — CEFAZOLIN SODIUM-DEXTROSE 2-3 GM-% IV SOLR
2.0000 g | INTRAVENOUS | Status: DC
  Filled 2013-02-02: qty 50

## 2013-02-02 NOTE — H&P (Signed)
  Subjective:    HPI  Pt of Mckenzie Mclaughlin referred for evaluation. Pt hx significant for fibrocystic breast disease and bilateral subcutaneous mastectomy (nipple/areola sparing) appr 40 years ago and silicone implant based reconstruction. States she remained C cup post procedure. Experienced fall 1 month ago with significant ecchymoses R breast, L knee abrasion. Has undergone workup with MMG and US demonstrating likely extracapsular implant rupture on right only. Pt last MMG 2013 normal and CT chest 06/2012 also with no evidence of rupture. Pt has had prior consultation for this and was rec to have both implant removed or right implant exchanged. Pt desires only implant removal; she expresses concern that procedure and anesthesia will damage her lungs. She experienced PTX during lung biopsy procedure this year and had prolonged hospital course for PTX, now resolved. Does require O2 for walking significant distances. No family hx breast ca. Reports + sensation in bilateal nipple/areola.   Review of Systems  Constitutional: Positive for unexpected weight change.  HENT: Negative.  Eyes:  + glasses, per chart blurry vision  Respiratory: Positive for shortness of breath.  Cardiovascular: Negative.  Endocrine: Positive for polydipsia.  Musculoskeletal: Positive for back pain.  Skin: Negative.  Allergic/Immunologic: Negative.  Neurological: Positive for dizziness.  Hematological: Bruises/bleeds easily.  Objective:   Physical Exam  Alert, no distress, no oxygen in place  Clear  Regular  Abdomen soft  Breast with right breast some pink color medial to NAC and asymmetry of shape/contour inferior poles  IMF incision bilateral  No contracture, able to pinch appr 3-4 cm of soft tissue over implants  Grade 2 ptosis bilat  SN to nipple R 25.5 L 26  BW R 15 L 14  Nipple to IMF R 18 L 18  Assessment:   Ruptured right breast implant, post reconstruction for fibrocystic breast disease  Plan:   Rec  removal R implant and capsulectomy, images suggest extracapsular silicone leak so some additional dissection of soft tissue is expected, this risks numbness or ischemia (necrosis) of nipple. Discussed that with implant removal alone, skin envelope will not contract and will have excess skin, worsening of ptosis. However I appreciate that she wishes to minimize her anesthesia given her comorbidities; counseled that removing left breast implant will require only short addition of time to procedure so consider it not significantly more risk to remove both implants. Unilateral procedure will have significant asymmetry, prosthesis themselves are heavy, require fitting, and may not be covered by insurance. She expressed understanding of these and would like to proceed with RIGHT implant removal alone. Discussed changes to MMG post breast procedure, risks including but not limited to bleeding, hematoma, seroma, unacceptable cosmetic result, cardiopulmonary complications, pneumothorax, pain.  She will speak with her cardiologist; if agreeable would like aspirin held for week prior toprocedure but given history will defer to cardiology and her PCP regarding this.  Discussed outpatient procedure, appr 1 hour of general anesthesia, possible drain placement.

## 2013-02-02 NOTE — Progress Notes (Signed)
In a note dated 01/25/13, Dr Crawford Givens said We would likely need to get evaluation by pulmonary pre op is she is going to have surgery.  He also said for patient to heck with surgeon too determine if she needed to stop Aspirin.   Pt stated that she saw Dr Mayo Ao at [facility] 02/01/13 and he said she would be ok to have surgery and that he would have to get the note to the hospital.  I did not find a note in the computer.  Dr Mayo Ao is not on call tonight.  Dr Jean Rosenthal notified.  I faxed a request to Dr Mayo Ao at [facility] and notified  answering service, spoke with Madelaine Bhat, sent message to on call and office that we need pulmonary clearance for 0730 case.

## 2013-02-03 ENCOUNTER — Ambulatory Visit (HOSPITAL_COMMUNITY)
Admission: RE | Admit: 2013-02-03 | Discharge: 2013-02-03 | Disposition: A | Discharge status: Home / Self Care | Payer: Medicare Other | Source: Ambulatory Visit | Attending: Plastic Surgery | Admitting: Plastic Surgery

## 2013-02-03 ENCOUNTER — Encounter (HOSPITAL_BASED_OUTPATIENT_CLINIC_OR_DEPARTMENT_OTHER): Payer: Self-pay

## 2013-02-03 ENCOUNTER — Encounter (HOSPITAL_COMMUNITY): Payer: Self-pay | Admitting: *Deleted

## 2013-02-03 ENCOUNTER — Encounter (HOSPITAL_COMMUNITY)
Admission: RE | Disposition: A | Discharge status: Home / Self Care | Payer: Self-pay | Source: Ambulatory Visit | Attending: Plastic Surgery

## 2013-02-03 ENCOUNTER — Ambulatory Visit (HOSPITAL_BASED_OUTPATIENT_CLINIC_OR_DEPARTMENT_OTHER): Admit: 2013-02-03 | Payer: Self-pay | Admitting: Plastic Surgery

## 2013-02-03 ENCOUNTER — Encounter (HOSPITAL_COMMUNITY): Payer: Medicare Other | Admitting: Certified Registered Nurse Anesthetist

## 2013-02-03 ENCOUNTER — Ambulatory Visit (HOSPITAL_COMMUNITY): Payer: Medicare Other | Admitting: Certified Registered Nurse Anesthetist

## 2013-02-03 DIAGNOSIS — T85898A Other specified complication of other internal prosthetic devices, implants and grafts, initial encounter: Secondary | ICD-10-CM | POA: Insufficient documentation

## 2013-02-03 DIAGNOSIS — F3289 Other specified depressive episodes: Secondary | ICD-10-CM | POA: Insufficient documentation

## 2013-02-03 DIAGNOSIS — T8543XS Leakage of breast prosthesis and implant, sequela: Secondary | ICD-10-CM

## 2013-02-03 DIAGNOSIS — Y831 Surgical operation with implant of artificial internal device as the cause of abnormal reaction of the patient, or of later complication, without mention of misadventure at the time of the procedure: Secondary | ICD-10-CM | POA: Insufficient documentation

## 2013-02-03 DIAGNOSIS — J449 Chronic obstructive pulmonary disease, unspecified: Secondary | ICD-10-CM | POA: Insufficient documentation

## 2013-02-03 DIAGNOSIS — K219 Gastro-esophageal reflux disease without esophagitis: Secondary | ICD-10-CM | POA: Insufficient documentation

## 2013-02-03 DIAGNOSIS — J4489 Other specified chronic obstructive pulmonary disease: Secondary | ICD-10-CM | POA: Insufficient documentation

## 2013-02-03 DIAGNOSIS — Z87891 Personal history of nicotine dependence: Secondary | ICD-10-CM | POA: Insufficient documentation

## 2013-02-03 DIAGNOSIS — I209 Angina pectoris, unspecified: Secondary | ICD-10-CM | POA: Insufficient documentation

## 2013-02-03 DIAGNOSIS — I2510 Atherosclerotic heart disease of native coronary artery without angina pectoris: Secondary | ICD-10-CM | POA: Insufficient documentation

## 2013-02-03 DIAGNOSIS — F329 Major depressive disorder, single episode, unspecified: Secondary | ICD-10-CM | POA: Insufficient documentation

## 2013-02-03 DIAGNOSIS — F411 Generalized anxiety disorder: Secondary | ICD-10-CM | POA: Insufficient documentation

## 2013-02-03 DIAGNOSIS — I10 Essential (primary) hypertension: Secondary | ICD-10-CM | POA: Insufficient documentation

## 2013-02-03 DIAGNOSIS — Z9981 Dependence on supplemental oxygen: Secondary | ICD-10-CM | POA: Insufficient documentation

## 2013-02-03 HISTORY — DX: Malignant (primary) neoplasm, unspecified: C80.1

## 2013-02-03 HISTORY — DX: Other specified postprocedural states: Z98.890

## 2013-02-03 HISTORY — DX: Other specified postprocedural states: R11.2

## 2013-02-03 HISTORY — DX: Constipation, unspecified: K59.00

## 2013-02-03 HISTORY — DX: Angina pectoris, unspecified: I20.9

## 2013-02-03 HISTORY — PX: TISSUE EXPANDER PLACEMENT: SHX2530

## 2013-02-03 HISTORY — DX: Cardiac arrhythmia, unspecified: I49.9

## 2013-02-03 HISTORY — DX: Depression, unspecified: F32.A

## 2013-02-03 HISTORY — DX: Adverse effect of unspecified anesthetic, initial encounter: T41.45XA

## 2013-02-03 HISTORY — DX: Mental disorder, not otherwise specified: F99

## 2013-02-03 HISTORY — DX: Other complications of anesthesia, initial encounter: T88.59XA

## 2013-02-03 HISTORY — DX: Dependence on supplemental oxygen: Z99.81

## 2013-02-03 HISTORY — DX: Personal history of other medical treatment: Z92.89

## 2013-02-03 HISTORY — DX: Unspecified intracranial injury with loss of consciousness of unspecified duration, initial encounter: S06.9X9A

## 2013-02-03 HISTORY — DX: Major depressive disorder, single episode, unspecified: F32.9

## 2013-02-03 LAB — BASIC METABOLIC PANEL
BUN: 18 mg/dL (ref 6–23)
Creatinine, Ser: 0.73 mg/dL (ref 0.50–1.10)
GFR calc Af Amer: >90 mL/min (ref >90)
GFR calc non Af Amer: 85 mL/min — ABNORMAL LOW (ref >90)

## 2013-02-03 LAB — CBC
HCT: 41.4 % (ref 36.0–46.0)
Hemoglobin: 13.8 g/dL (ref 12.0–15.0)
MCH: 30.3 pg (ref 26.0–34.0)
RBC: 4.55 MIL/uL (ref 3.87–5.11)

## 2013-02-03 SURGERY — CAPSULECTOMY, BREAST, WITH REPLACEMENT OF IMPLANT
Anesthesia: General | Site: Breast | Laterality: Right

## 2013-02-03 SURGERY — INSERTION, TISSUE EXPANDER
Anesthesia: General | Site: Breast | Laterality: Right | Wound class: Clean

## 2013-02-03 MED ORDER — PROMETHAZINE HCL 25 MG/ML IJ SOLN
INTRAMUSCULAR | Status: AC
  Filled 2013-02-03: qty 1

## 2013-02-03 MED ORDER — ACETAMINOPHEN-CODEINE #3 300-30 MG PO TABS: 1.0000 | ORAL_TABLET | ORAL | Status: DC | PRN

## 2013-02-03 MED ORDER — CHLORHEXIDINE GLUCONATE 4 % EX LIQD: 1.0000 "application " | Freq: Once | CUTANEOUS | Status: DC

## 2013-02-03 MED ORDER — 0.9 % SODIUM CHLORIDE (POUR BTL) OPTIME
TOPICAL | Status: DC | PRN
  Administered 2013-02-03: 1000 mL

## 2013-02-03 MED ORDER — MEPERIDINE HCL 25 MG/ML IJ SOLN: 6.2500 mg | INTRAMUSCULAR | Status: DC | PRN

## 2013-02-03 MED ORDER — CLINDAMYCIN PHOSPHATE 600 MG/50ML IV SOLN
INTRAVENOUS | Status: AC
  Filled 2013-02-03: qty 50

## 2013-02-03 MED ORDER — ONDANSETRON HCL 4 MG/2ML IJ SOLN
INTRAMUSCULAR | Status: DC | PRN
  Administered 2013-02-03: 4 mg via INTRAVENOUS

## 2013-02-03 MED ORDER — OXYCODONE HCL 5 MG/5ML PO SOLN: 5.0000 mg | Freq: Once | ORAL | Status: DC | PRN

## 2013-02-03 MED ORDER — LACTATED RINGERS IV SOLN
INTRAVENOUS | Status: DC | PRN
  Administered 2013-02-03: 07:00:00 via INTRAVENOUS

## 2013-02-03 MED ORDER — MIDAZOLAM HCL 5 MG/5ML IJ SOLN
INTRAMUSCULAR | Status: DC | PRN
  Administered 2013-02-03 (×2): 1 mg via INTRAVENOUS

## 2013-02-03 MED ORDER — PROMETHAZINE HCL 25 MG/ML IJ SOLN
6.2500 mg | INTRAMUSCULAR | Status: DC | PRN
  Administered 2013-02-03: 6.25 mg via INTRAVENOUS

## 2013-02-03 MED ORDER — CLINDAMYCIN PHOSPHATE 600 MG/50ML IV SOLN
INTRAVENOUS | Status: DC | PRN
  Administered 2013-02-03: 600 mg via INTRAVENOUS

## 2013-02-03 MED ORDER — LIDOCAINE HCL (CARDIAC) 20 MG/ML IV SOLN
INTRAVENOUS | Status: DC | PRN
  Administered 2013-02-03: 40 mg via INTRAVENOUS

## 2013-02-03 MED ORDER — OXYCODONE HCL 5 MG PO TABS: 5.0000 mg | ORAL_TABLET | Freq: Once | ORAL | Status: DC | PRN

## 2013-02-03 MED ORDER — METOPROLOL SUCCINATE ER 50 MG PO TB24
50.0000 mg | ORAL_TABLET | Freq: Every day | ORAL | Status: DC
  Administered 2013-02-03: 50 mg via ORAL
  Filled 2013-02-03 (×2): qty 1

## 2013-02-03 MED ORDER — SODIUM CHLORIDE 0.9 % IJ SOLN
INTRAMUSCULAR | Status: AC
  Filled 2013-02-03: qty 9

## 2013-02-03 MED ORDER — HYDROMORPHONE HCL PF 1 MG/ML IJ SOLN
0.2500 mg | INTRAMUSCULAR | Status: DC | PRN
  Administered 2013-02-03: 0.5 mg via INTRAVENOUS

## 2013-02-03 MED ORDER — FENTANYL CITRATE 0.05 MG/ML IJ SOLN
INTRAMUSCULAR | Status: DC | PRN
  Administered 2013-02-03 (×2): 50 ug via INTRAVENOUS

## 2013-02-03 MED ORDER — PROPOFOL 10 MG/ML IV BOLUS
INTRAVENOUS | Status: DC | PRN
  Administered 2013-02-03: 100 mg via INTRAVENOUS

## 2013-02-03 MED ORDER — HYDROMORPHONE HCL PF 1 MG/ML IJ SOLN
INTRAMUSCULAR | Status: AC
  Filled 2013-02-03: qty 1

## 2013-02-03 SURGICAL SUPPLY — 52 items
ADH SKN CLS APL DERMABOND .7 (GAUZE/BANDAGES/DRESSINGS)
BAG DECANTER FOR FLEXI CONT (MISCELLANEOUS) ×1 IMPLANT
BINDER BREAST LRG (GAUZE/BANDAGES/DRESSINGS) IMPLANT
BINDER BREAST XLRG (GAUZE/BANDAGES/DRESSINGS) IMPLANT
BIOPATCH RED 1 DISK 7.0 (GAUZE/BANDAGES/DRESSINGS) ×1 IMPLANT
CANISTER SUCTION 2500CC (MISCELLANEOUS) ×1 IMPLANT
CHLORAPREP W/TINT 26ML (MISCELLANEOUS) ×2 IMPLANT
CLOTH BEACON ORANGE TIMEOUT ST (SAFETY) ×1 IMPLANT
COVER SURGICAL LIGHT HANDLE (MISCELLANEOUS) ×2 IMPLANT
DERMABOND ADVANCED (GAUZE/BANDAGES/DRESSINGS)
DERMABOND ADVANCED .7 DNX12 (GAUZE/BANDAGES/DRESSINGS) ×1 IMPLANT
DRAIN CHANNEL 19F RND (DRAIN) ×2 IMPLANT
DRAPE ORTHO SPLIT 77X108 STRL (DRAPES) ×2
DRAPE PROXIMA HALF (DRAPES) ×4 IMPLANT
DRAPE SURG 17X23 STRL (DRAPES) ×6 IMPLANT
DRAPE SURG ORHT 6 SPLT 77X108 (DRAPES) ×2 IMPLANT
DRAPE WARM FLUID 44X44 (DRAPE) ×1 IMPLANT
DRSG PAD ABDOMINAL 8X10 ST (GAUZE/BANDAGES/DRESSINGS) ×3 IMPLANT
ELECT BLADE 4.0 EZ CLEAN MEGAD (MISCELLANEOUS) ×2
ELECT REM PT RETURN 9FT ADLT (ELECTROSURGICAL) ×2
ELECTRODE BLDE 4.0 EZ CLN MEGD (MISCELLANEOUS) ×1 IMPLANT
ELECTRODE REM PT RTRN 9FT ADLT (ELECTROSURGICAL) ×1 IMPLANT
EVACUATOR SILICONE 100CC (DRAIN) ×2 IMPLANT
GLOVE BIO SURGEON STRL SZ 6 (GLOVE) ×1 IMPLANT
GLOVE BIO SURGEON STRL SZ 6.5 (GLOVE) ×1 IMPLANT
GLOVE ECLIPSE 6.5 STRL STRAW (GLOVE) ×1 IMPLANT
GOWN STRL NON-REIN LRG LVL3 (GOWN DISPOSABLE) ×4 IMPLANT
KIT BASIN OR (CUSTOM PROCEDURE TRAY) ×2 IMPLANT
KIT ROOM TURNOVER OR (KITS) ×2 IMPLANT
NS IRRIG 1000ML POUR BTL (IV SOLUTION) ×4 IMPLANT
PACK GENERAL/GYN (CUSTOM PROCEDURE TRAY) ×2 IMPLANT
PAD ARMBOARD 7.5X6 YLW CONV (MISCELLANEOUS) ×1 IMPLANT
PIN SAFETY STERILE (MISCELLANEOUS) ×2 IMPLANT
SET ASEPTIC TRANSFER (MISCELLANEOUS) IMPLANT
SPONGE GAUZE 4X4 12PLY (GAUZE/BANDAGES/DRESSINGS) ×2 IMPLANT
STAPLER VISISTAT 35W (STAPLE) ×1 IMPLANT
STRIP CLOSURE SKIN 1/2X4 (GAUZE/BANDAGES/DRESSINGS) ×1 IMPLANT
SUT ETHILON 2 0 FS 18 (SUTURE) ×1 IMPLANT
SUT MNCRL AB 4-0 PS2 18 (SUTURE) ×1 IMPLANT
SUT MON AB 5-0 PS2 18 (SUTURE) ×2 IMPLANT
SUT PDS AB 2-0 CT1 27 (SUTURE) ×4 IMPLANT
SUT SILK 3 0 SH 30 (SUTURE) ×1 IMPLANT
SUT VIC AB 3-0 PS2 18 (SUTURE) ×2
SUT VIC AB 3-0 PS2 18XBRD (SUTURE) IMPLANT
SUT VIC AB 3-0 SH 27 (SUTURE)
SUT VIC AB 3-0 SH 27X BRD (SUTURE) ×3 IMPLANT
SUT VIC AB 4-0 PS2 27 (SUTURE) ×1 IMPLANT
SUT VICRYL 4-0 PS2 18IN ABS (SUTURE) ×2 IMPLANT
TAPE CLOTH SURG 4X10 WHT LF (GAUZE/BANDAGES/DRESSINGS) ×1 IMPLANT
TOWEL OR 17X24 6PK STRL BLUE (TOWEL DISPOSABLE) ×2 IMPLANT
TOWEL OR 17X26 10 PK STRL BLUE (TOWEL DISPOSABLE) ×2 IMPLANT
TRAY FOLEY CATH 14FRSI W/METER (CATHETERS) IMPLANT

## 2013-02-03 NOTE — Anesthesia Postprocedure Evaluation (Signed)
  Anesthesia Post-op Note  Patient: Mckenzie Mclaughlin  Procedure(s) Performed: Procedure(s): REMOVAL OF RIGHT BREAST IMPLANT AND IMPLANT MATERIAL  CAPSULECTOMY (Right)  Patient Location: PACU  Anesthesia Type:General  Level of Consciousness: awake, alert  and oriented  Airway and Oxygen Therapy: Patient Spontanous Breathing  Post-op Pain: mild  Post-op Assessment: Post-op Vital signs reviewed, Patient's Cardiovascular Status Stable, Respiratory Function Stable, Patent Airway, No signs of Nausea or vomiting and Pain level controlled  Post-op Vital Signs: Reviewed and stable  Complications: Loss of lower central incisor

## 2013-02-03 NOTE — Anesthesia Preprocedure Evaluation (Addendum)
Anesthesia Evaluation  Patient identified by MRN, date of birth, ID band Patient awake    Reviewed: Allergy & Precautions, H&P , NPO status , Patient's Chart, lab work & pertinent test results, reviewed documented beta blocker date and time   History of Anesthesia Complications (+) PONV and history of anesthetic complications  Airway Mallampati: II TM Distance: >3 FB Neck ROM: Full    Dental  (+) Edentulous Upper and Dental Advisory Given   Pulmonary shortness of breath (rsometimes uses oxygen), with exertion and Long-Term Oxygen Therapy, COPD COPD inhaler, former smoker (quit 1 year ago),  Lung biopsy 12/13, had PTX at that time, resolved breath sounds clear to auscultation        Cardiovascular hypertension, Pt. on medications + angina (last 3 weeks ago, stress test OK as per pt) with exertion + CAD and + Cardiac Stents (RCA stent, otherwise non-obstructive ASCADz) Rhythm:Regular Rate:Normal  '13 ECHO: EF>55%, valves OK 9/14 stress test: OK as per patient   Neuro/Psych PSYCHIATRIC DISORDERS Anxiety Depression negative neurological ROS     GI/Hepatic Neg liver ROS, GERD-  Controlled,  Endo/Other  negative endocrine ROS  Renal/GU negative Renal ROS     Musculoskeletal   Abdominal Normal abdominal exam  (+)   Peds  Hematology   Anesthesia Other Findings   Reproductive/Obstetrics                          Anesthesia Physical Anesthesia Plan  ASA: III  Anesthesia Plan: General   Post-op Pain Management:    Induction: Intravenous  Airway Management Planned: LMA  Additional Equipment:   Intra-op Plan:   Post-operative Plan:   Informed Consent: I have reviewed the patients History and Physical, chart, labs and discussed the procedure including the risks, benefits and alternatives for the proposed anesthesia with the patient or authorized representative who has indicated his/her  understanding and acceptance.   Dental advisory given  Plan Discussed with: CRNA, Anesthesiologist and Surgeon  Anesthesia Plan Comments: (Plan routine monitors, GA- LMA OK)       Anesthesia Quick Evaluation

## 2013-02-03 NOTE — Interval H&P Note (Signed)
History and Physical Interval Note:  02/03/2013 7:27 AM  Mckenzie Mclaughlin  has presented today for surgery, with the diagnosis of RIGHT BREAST IMPLANT LEAKAGE, ACCORD ABSCENSE OF BREAST BILATERAL    The various methods of treatment have been discussed with the patient and family. After consideration of risks, benefits and other options for treatment, the patient has consented to  Procedure(s): REMOVAL OF RIGHT BREAST IMPLANT AND IMPLANT MATERIAL  CAPSULECTOMY (Right) as a surgical intervention .  The patient's history has been reviewed, patient examined, no change in status, stable for surgery.  I have reviewed the patient's chart and labs.  Questions were answered to the patient's satisfaction.     Anjela Cassara

## 2013-02-03 NOTE — Brief Op Note (Signed)
02/03/2013  9:20 AM  PATIENT:  Mckenzie Mclaughlin  72 y.o. female  PRE-OPERATIVE DIAGNOSIS:  RIGHT BREAST IMPLANT LEAKAGE, ACCORD ABSCENSE OF BREAST BILATERAL    POST-OPERATIVE DIAGNOSIS:  Right breast implant rupture  PROCEDURE:  Procedure(s): REMOVAL OF RIGHT BREAST IMPLANT AND IMPLANT MATERIAL  CAPSULECTOMY (Right)  SURGEON:  Surgeon(s) and Role:    * Glenna Fellows, MD - Primary  PHYSICIAN ASSISTANT:     ANESTHESIA:   general  EBL:  Total I/O In: 400 [I.V.:400] Out: 10 [Blood:10]  BLOOD ADMINISTERED:none  DRAINS: (19 Fr) Jackson-Pratt drain(s) with closed bulb suction in the right breast   LOCAL MEDICATIONS USED:  NONE  SPECIMEN:  Source of Specimen:  right breast capsulectomy  DISPOSITION OF SPECIMEN:  PATHOLOGY  COUNTS:  YES  TOURNIQUET:  * No tourniquets in log *  DICTATION: .Note written in EPIC  PLAN OF CARE: Discharge to home after PACU  PATIENT DISPOSITION:  PACU - hemodynamically stable.   Delay start of Pharmacological VTE agent (>24hrs) due to surgical blood loss or risk of bleeding: not applicable

## 2013-02-03 NOTE — Anesthesia Postprocedure Evaluation (Signed)
  Anesthesia Post-op Note  Patient: Mckenzie Mclaughlin  Procedure(s) Performed: Procedure(s): REMOVAL OF RIGHT BREAST IMPLANT AND IMPLANT MATERIAL  CAPSULECTOMY (Right)  Patient Location: PACU  Anesthesia Type:General  Level of Consciousness: awake, alert  and oriented  Airway and Oxygen Therapy: Patient Spontanous Breathing  Post-op Pain: mild  Post-op Assessment: Post-op Vital signs reviewed, Patient's Cardiovascular Status Stable, Respiratory Function Stable, Patent Airway, No signs of Nausea or vomiting and Pain level controlled  Post-op Vital Signs: Reviewed and stable  Complications: Loss of lower central incisor 

## 2013-02-03 NOTE — Op Note (Signed)
Operative Note   DATE OF OPERATION: 02/03/13  SURGICAL DIVISION: Plastic Surgery  PREOPERATIVE DIAGNOSES:  Ruptured right breast implant  POSTOPERATIVE DIAGNOSES:  same  PROCEDURE:  Removal ruptured right breast implant and implant material, capsulectomy  SURGEON: Glenna Fellows MD MBA  ASSISTANT: none  ANESTHESIA:  General.   EBL:  COMPLICATIONS: None.   INDICATIONS FOR PROCEDURE:  The patient, Mckenzie Mclaughlin, is a 72 y.o. female born on 1941-02-23, is here for treatment of ruptured right breast implant. Her history is significant for subcutaneous mastectomy in past for fibrocystic disease and implant placement.   FINDINGS: Gross intracapsular rupture with small foci extracapsular leak noted. Thick calcified capsule.   DESCRIPTION OF PROCEDURE:  The patient's operative site was marked with the patient in the preoperative area. The patient was taken to the operating room. SCDs were placed and IV antibiotics were given. The patient's operative site was prepped and draped in a sterile fashion. A time out was performed and all information was confirmed to be correct.  The patient's previous IMF scar was excised and incision was carried through subcutaneous and superficial fascia until capsule encountered. Dissection around capsule completed over lower pole breast. In order to complete dissection, portion of anterior capsule excised with cautery and gross implant rupture noted. Implant and its materials delivered into lap sponges and passed off field. Remainder of thick calcified capsule excised with cautery. Small foci of extracapsular leak noted over anterior surface, medial breast. Implant was in subcutaneous position with intact pectoralis muscle. Following complete capsulectomy, wound thoroughly irrigated with saline. Hemostasis obtained with cautery. 19 Fr JP drain placed in SQ cavity and secured to skin with 2-0 nylon. Running 3-0 vicryl used to approximated superficial fascia. Interrupted  4-0 vicryl in dermis and running 4-0 monocryl subcuticular skin closure. Steris applied followed by dry dressing.  The patient was allowed to wake from anesthesia, extubated and taken to the recovery room in satisfactory condition.   SPECIMENS: right breast capsule  DRAINS: 19 Fr JP in subcutaneous breast

## 2013-02-03 NOTE — Transfer of Care (Signed)
Immediate Anesthesia Transfer of Care Note  Patient: Mckenzie Mclaughlin  Procedure(s) Performed: Procedure(s): REMOVAL OF RIGHT BREAST IMPLANT AND IMPLANT MATERIAL  CAPSULECTOMY (Right)  Patient Location: PACU  Anesthesia Type:General  Level of Consciousness: awake, alert  and oriented  Airway & Oxygen Therapy: Patient Spontanous Breathing  Post-op Assessment: Report given to PACU RN  Post vital signs: Reviewed and stable  Complications: No apparent anesthesia complications

## 2013-02-06 ENCOUNTER — Encounter (HOSPITAL_COMMUNITY): Payer: Self-pay | Admitting: Plastic Surgery

## 2013-03-01 ENCOUNTER — Other Ambulatory Visit: Payer: Self-pay | Admitting: Family Medicine

## 2013-03-01 NOTE — Telephone Encounter (Signed)
Received refill request electronically. Last office visit 12/21/12/acute, last refill 01/03/13. Is it okay to refill medication?

## 2013-03-02 NOTE — Telephone Encounter (Signed)
Please call in

## 2013-03-02 NOTE — Telephone Encounter (Signed)
Medication phoned to pharmacy.  

## 2013-03-13 ENCOUNTER — Other Ambulatory Visit: Payer: Self-pay

## 2013-03-13 MED ORDER — LORAZEPAM 1 MG PO TABS: ORAL_TABLET | ORAL | Status: DC

## 2013-03-13 NOTE — Telephone Encounter (Signed)
Pt left v/m; when last got refill lorazepam quantity was # 30;pt requesting lorazepam # 60 with refills to [facility] [address].Please advise. Pt request cb.

## 2013-03-13 NOTE — Telephone Encounter (Signed)
Rx called in as prescribed, and pt notified  

## 2013-03-13 NOTE — Telephone Encounter (Signed)
Please call in.  Thanks.   

## 2013-03-31 ENCOUNTER — Other Ambulatory Visit: Payer: Self-pay | Admitting: Family Medicine

## 2013-05-02 ENCOUNTER — Other Ambulatory Visit: Payer: Self-pay | Admitting: Family Medicine

## 2013-05-02 NOTE — Telephone Encounter (Signed)
Effexor last prescribed1/2/15--simvastatin last prescribed 04/01/12-- lorazepam last prescribed 03/13/13--please advise

## 2013-05-03 NOTE — Telephone Encounter (Signed)
Called to [facility], Chester Gasper Sells, MA Student

## 2013-05-03 NOTE — Telephone Encounter (Signed)
Please call in lorazepam, others sent. Schedule CPE when possible. Thanks.

## 2013-05-14 ENCOUNTER — Emergency Department (HOSPITAL_COMMUNITY): Payer: Medicare Other

## 2013-05-14 ENCOUNTER — Encounter (HOSPITAL_COMMUNITY): Payer: Self-pay | Admitting: Emergency Medicine

## 2013-05-14 ENCOUNTER — Inpatient Hospital Stay (HOSPITAL_COMMUNITY)
Admission: EM | Admit: 2013-05-14 | Discharge: 2013-05-19 | DRG: 192 | Disposition: A | Discharge status: Home / Self Care | Payer: Medicare Other | Attending: Family Medicine | Admitting: Family Medicine

## 2013-05-14 DIAGNOSIS — E43 Unspecified severe protein-calorie malnutrition: Secondary | ICD-10-CM

## 2013-05-14 DIAGNOSIS — D179 Benign lipomatous neoplasm, unspecified: Secondary | ICD-10-CM

## 2013-05-14 DIAGNOSIS — F3289 Other specified depressive episodes: Secondary | ICD-10-CM | POA: Diagnosis present

## 2013-05-14 DIAGNOSIS — M545 Low back pain, unspecified: Secondary | ICD-10-CM

## 2013-05-14 DIAGNOSIS — R600 Localized edema: Secondary | ICD-10-CM

## 2013-05-14 DIAGNOSIS — I10 Essential (primary) hypertension: Secondary | ICD-10-CM

## 2013-05-14 DIAGNOSIS — F329 Major depressive disorder, single episode, unspecified: Secondary | ICD-10-CM

## 2013-05-14 DIAGNOSIS — J441 Chronic obstructive pulmonary disease with (acute) exacerbation: Principal | ICD-10-CM | POA: Diagnosis present

## 2013-05-14 DIAGNOSIS — F17200 Nicotine dependence, unspecified, uncomplicated: Secondary | ICD-10-CM

## 2013-05-14 DIAGNOSIS — K219 Gastro-esophageal reflux disease without esophagitis: Secondary | ICD-10-CM

## 2013-05-14 DIAGNOSIS — I252 Old myocardial infarction: Secondary | ICD-10-CM

## 2013-05-14 DIAGNOSIS — Z836 Family history of other diseases of the respiratory system: Secondary | ICD-10-CM

## 2013-05-14 DIAGNOSIS — N951 Menopausal and female climacteric states: Secondary | ICD-10-CM

## 2013-05-14 DIAGNOSIS — R0789 Other chest pain: Secondary | ICD-10-CM

## 2013-05-14 DIAGNOSIS — R000 Tachycardia, unspecified: Secondary | ICD-10-CM

## 2013-05-14 DIAGNOSIS — K228 Other specified diseases of esophagus: Secondary | ICD-10-CM

## 2013-05-14 DIAGNOSIS — K222 Esophageal obstruction: Secondary | ICD-10-CM

## 2013-05-14 DIAGNOSIS — H6120 Impacted cerumen, unspecified ear: Secondary | ICD-10-CM

## 2013-05-14 DIAGNOSIS — R51 Headache: Secondary | ICD-10-CM

## 2013-05-14 DIAGNOSIS — I2510 Atherosclerotic heart disease of native coronary artery without angina pectoris: Secondary | ICD-10-CM

## 2013-05-14 DIAGNOSIS — Z8249 Family history of ischemic heart disease and other diseases of the circulatory system: Secondary | ICD-10-CM

## 2013-05-14 DIAGNOSIS — R109 Unspecified abdominal pain: Secondary | ICD-10-CM

## 2013-05-14 DIAGNOSIS — Z9289 Personal history of other medical treatment: Secondary | ICD-10-CM

## 2013-05-14 DIAGNOSIS — J4489 Other specified chronic obstructive pulmonary disease: Secondary | ICD-10-CM

## 2013-05-14 DIAGNOSIS — I4949 Other premature depolarization: Secondary | ICD-10-CM

## 2013-05-14 DIAGNOSIS — F341 Dysthymic disorder: Secondary | ICD-10-CM

## 2013-05-14 DIAGNOSIS — S99911A Unspecified injury of right ankle, initial encounter: Secondary | ICD-10-CM

## 2013-05-14 DIAGNOSIS — J449 Chronic obstructive pulmonary disease, unspecified: Secondary | ICD-10-CM

## 2013-05-14 DIAGNOSIS — J111 Influenza due to unidentified influenza virus with other respiratory manifestations: Secondary | ICD-10-CM | POA: Diagnosis present

## 2013-05-14 DIAGNOSIS — M62838 Other muscle spasm: Secondary | ICD-10-CM

## 2013-05-14 DIAGNOSIS — Z7982 Long term (current) use of aspirin: Secondary | ICD-10-CM

## 2013-05-14 DIAGNOSIS — Z9861 Coronary angioplasty status: Secondary | ICD-10-CM

## 2013-05-14 DIAGNOSIS — M542 Cervicalgia: Secondary | ICD-10-CM

## 2013-05-14 DIAGNOSIS — K5900 Constipation, unspecified: Secondary | ICD-10-CM

## 2013-05-14 DIAGNOSIS — E538 Deficiency of other specified B group vitamins: Secondary | ICD-10-CM

## 2013-05-14 DIAGNOSIS — R202 Paresthesia of skin: Secondary | ICD-10-CM

## 2013-05-14 DIAGNOSIS — E785 Hyperlipidemia, unspecified: Secondary | ICD-10-CM

## 2013-05-14 DIAGNOSIS — I493 Ventricular premature depolarization: Secondary | ICD-10-CM

## 2013-05-14 DIAGNOSIS — T148XXA Other injury of unspecified body region, initial encounter: Secondary | ICD-10-CM

## 2013-05-14 DIAGNOSIS — R911 Solitary pulmonary nodule: Secondary | ICD-10-CM

## 2013-05-14 DIAGNOSIS — F419 Anxiety disorder, unspecified: Secondary | ICD-10-CM

## 2013-05-14 DIAGNOSIS — Z13820 Encounter for screening for osteoporosis: Secondary | ICD-10-CM

## 2013-05-14 DIAGNOSIS — Z79899 Other long term (current) drug therapy: Secondary | ICD-10-CM

## 2013-05-14 DIAGNOSIS — R6889 Other general symptoms and signs: Secondary | ICD-10-CM

## 2013-05-14 DIAGNOSIS — Z85828 Personal history of other malignant neoplasm of skin: Secondary | ICD-10-CM

## 2013-05-14 DIAGNOSIS — F411 Generalized anxiety disorder: Secondary | ICD-10-CM | POA: Diagnosis present

## 2013-05-14 DIAGNOSIS — N63 Unspecified lump in unspecified breast: Secondary | ICD-10-CM

## 2013-05-14 DIAGNOSIS — Z87891 Personal history of nicotine dependence: Secondary | ICD-10-CM

## 2013-05-14 DIAGNOSIS — M7062 Trochanteric bursitis, left hip: Secondary | ICD-10-CM

## 2013-05-14 DIAGNOSIS — F32A Depression, unspecified: Secondary | ICD-10-CM

## 2013-05-14 DIAGNOSIS — R42 Dizziness and giddiness: Secondary | ICD-10-CM

## 2013-05-14 DIAGNOSIS — K2289 Other specified disease of esophagus: Secondary | ICD-10-CM

## 2013-05-14 LAB — CBC WITH DIFFERENTIAL/PLATELET
Basophils Absolute: 0 10*3/uL (ref 0.0–0.1)
Basophils Relative: 0 % (ref 0–1)
EOS PCT: 0 % (ref 0–5)
Eosinophils Absolute: 0 10*3/uL (ref 0.0–0.7)
HCT: 42.8 % (ref 36.0–46.0)
HEMOGLOBIN: 14.5 g/dL (ref 12.0–15.0)
LYMPHS ABS: 0.7 10*3/uL (ref 0.7–4.0)
Lymphocytes Relative: 9 % — ABNORMAL LOW (ref 12–46)
MCH: 31 pg (ref 26.0–34.0)
MCHC: 33.9 g/dL (ref 30.0–36.0)
MCV: 91.5 fL (ref 78.0–100.0)
Monocytes Absolute: 0.9 10*3/uL (ref 0.1–1.0)
Monocytes Relative: 12 % (ref 3–12)
Neutro Abs: 5.9 10*3/uL (ref 1.7–7.7)
Neutrophils Relative %: 79 % — ABNORMAL HIGH (ref 43–77)
Platelets: 217 10*3/uL (ref 150–400)
RBC: 4.68 MIL/uL (ref 3.87–5.11)
RDW: 13 % (ref 11.5–15.5)
WBC: 7.5 10*3/uL (ref 4.0–10.5)

## 2013-05-14 LAB — BASIC METABOLIC PANEL
BUN: 14 mg/dL (ref 6–23)
CALCIUM: 9.5 mg/dL (ref 8.4–10.5)
CO2: 26 mEq/L (ref 19–32)
Chloride: 96 mEq/L (ref 96–112)
Creatinine, Ser: 0.77 mg/dL (ref 0.50–1.10)
GFR calc Af Amer: >90 mL/min (ref >90)
GFR calc non Af Amer: 83 mL/min — ABNORMAL LOW (ref >90)
GLUCOSE: 98 mg/dL (ref 70–99)
Potassium: 4.9 mEq/L (ref 3.7–5.3)
Sodium: 137 mEq/L (ref 137–147)

## 2013-05-14 LAB — TROPONIN I: Troponin I: <0.3 ng/mL (ref <0.30)

## 2013-05-14 LAB — INFLUENZA PANEL BY PCR (TYPE A & B)
H1N1FLUPCR: NOT DETECTED
INFLAPCR: NEGATIVE
Influenza B By PCR: POSITIVE — AB

## 2013-05-14 MED ORDER — BUDESONIDE-FORMOTEROL FUMARATE 160-4.5 MCG/ACT IN AERO
2.0000 | INHALATION_SPRAY | Freq: Two times a day (BID) | RESPIRATORY_TRACT | Status: DC
  Administered 2013-05-15 – 2013-05-19 (×9): 2 via RESPIRATORY_TRACT
  Filled 2013-05-14: qty 6

## 2013-05-14 MED ORDER — IPRATROPIUM BROMIDE 0.02 % IN SOLN
0.5000 mg | Freq: Once | RESPIRATORY_TRACT | Status: AC
  Administered 2013-05-14: 0.5 mg via RESPIRATORY_TRACT
  Filled 2013-05-14: qty 2.5

## 2013-05-14 MED ORDER — ENOXAPARIN SODIUM 40 MG/0.4ML ~~LOC~~ SOLN
40.0000 mg | SUBCUTANEOUS | Status: DC
  Administered 2013-05-14 – 2013-05-18 (×5): 40 mg via SUBCUTANEOUS
  Filled 2013-05-14 (×6): qty 0.4

## 2013-05-14 MED ORDER — ACETAMINOPHEN 650 MG RE SUPP: 650.0000 mg | Freq: Four times a day (QID) | RECTAL | Status: DC | PRN

## 2013-05-14 MED ORDER — LEVALBUTEROL HCL 0.63 MG/3ML IN NEBU
0.6300 mg | INHALATION_SOLUTION | Freq: Four times a day (QID) | RESPIRATORY_TRACT | Status: DC
  Administered 2013-05-14 – 2013-05-15 (×7): 0.63 mg via RESPIRATORY_TRACT
  Filled 2013-05-14 (×11): qty 3

## 2013-05-14 MED ORDER — ONDANSETRON HCL 4 MG/2ML IJ SOLN: 4.0000 mg | Freq: Four times a day (QID) | INTRAMUSCULAR | Status: DC | PRN

## 2013-05-14 MED ORDER — METOPROLOL SUCCINATE ER 50 MG PO TB24
50.0000 mg | ORAL_TABLET | Freq: Every day | ORAL | Status: DC
  Administered 2013-05-14 – 2013-05-19 (×6): 50 mg via ORAL
  Filled 2013-05-14 (×6): qty 1

## 2013-05-14 MED ORDER — ALBUTEROL (5 MG/ML) CONTINUOUS INHALATION SOLN
10.0000 mg/h | INHALATION_SOLUTION | Freq: Once | RESPIRATORY_TRACT | Status: AC
  Administered 2013-05-14: 10 mg/h via RESPIRATORY_TRACT
  Filled 2013-05-14: qty 20

## 2013-05-14 MED ORDER — METOCLOPRAMIDE HCL 5 MG/ML IJ SOLN
10.0000 mg | Freq: Once | INTRAMUSCULAR | Status: AC
  Administered 2013-05-14: 10 mg via INTRAVENOUS
  Filled 2013-05-14: qty 2

## 2013-05-14 MED ORDER — PHENOL 1.4 % MT LIQD
1.0000 | OROMUCOSAL | Status: DC | PRN
  Administered 2013-05-14 – 2013-05-16 (×3): 1 via OROMUCOSAL
  Filled 2013-05-14: qty 177

## 2013-05-14 MED ORDER — LEVALBUTEROL HCL 0.63 MG/3ML IN NEBU
0.6300 mg | INHALATION_SOLUTION | RESPIRATORY_TRACT | Status: DC | PRN
  Administered 2013-05-15: 0.63 mg via RESPIRATORY_TRACT
  Filled 2013-05-14: qty 3

## 2013-05-14 MED ORDER — SIMVASTATIN 10 MG PO TABS
10.0000 mg | ORAL_TABLET | Freq: Every day | ORAL | Status: DC
  Administered 2013-05-14 – 2013-05-18 (×5): 10 mg via ORAL
  Filled 2013-05-14 (×6): qty 1

## 2013-05-14 MED ORDER — IPRATROPIUM BROMIDE 0.02 % IN SOLN
0.5000 mg | Freq: Four times a day (QID) | RESPIRATORY_TRACT | Status: DC
  Administered 2013-05-14 – 2013-05-15 (×7): 0.5 mg via RESPIRATORY_TRACT
  Filled 2013-05-14 (×7): qty 2.5

## 2013-05-14 MED ORDER — ASPIRIN 81 MG PO TABS
81.0000 mg | ORAL_TABLET | Freq: Every day | ORAL | Status: DC
  Administered 2013-05-14 – 2013-05-16 (×3): 81 mg via ORAL
  Filled 2013-05-14 (×4): qty 1

## 2013-05-14 MED ORDER — METHYLPREDNISOLONE SODIUM SUCC 125 MG IJ SOLR
125.0000 mg | Freq: Once | INTRAMUSCULAR | Status: AC
  Administered 2013-05-14: 125 mg via INTRAVENOUS
  Filled 2013-05-14: qty 2

## 2013-05-14 MED ORDER — TIOTROPIUM BROMIDE MONOHYDRATE 18 MCG IN CAPS
18.0000 ug | ORAL_CAPSULE | Freq: Every day | RESPIRATORY_TRACT | Status: DC
  Filled 2013-05-14: qty 5

## 2013-05-14 MED ORDER — OSELTAMIVIR PHOSPHATE 75 MG PO CAPS
75.0000 mg | ORAL_CAPSULE | Freq: Two times a day (BID) | ORAL | Status: AC
  Administered 2013-05-14 – 2013-05-18 (×10): 75 mg via ORAL
  Filled 2013-05-14 (×10): qty 1

## 2013-05-14 MED ORDER — ACETAMINOPHEN 325 MG PO TABS: 650.0000 mg | ORAL_TABLET | Freq: Four times a day (QID) | ORAL | Status: DC | PRN

## 2013-05-14 MED ORDER — SODIUM CHLORIDE 0.9 % IV SOLN
INTRAVENOUS | Status: DC
  Administered 2013-05-14 – 2013-05-15 (×4): via INTRAVENOUS
  Administered 2013-05-16: 20 mL via INTRAVENOUS
  Administered 2013-05-17: 14:00:00 via INTRAVENOUS

## 2013-05-14 MED ORDER — ONDANSETRON HCL 4 MG PO TABS: 4.0000 mg | ORAL_TABLET | Freq: Four times a day (QID) | ORAL | Status: DC | PRN

## 2013-05-14 MED ORDER — KETOROLAC TROMETHAMINE 30 MG/ML IJ SOLN
30.0000 mg | Freq: Once | INTRAMUSCULAR | Status: AC
  Administered 2013-05-14: 30 mg via INTRAVENOUS
  Filled 2013-05-14: qty 1

## 2013-05-14 MED ORDER — ENOXAPARIN SODIUM 30 MG/0.3ML ~~LOC~~ SOLN: 30.0000 mg | SUBCUTANEOUS | Status: DC

## 2013-05-14 MED ORDER — LORAZEPAM 1 MG PO TABS
1.0000 mg | ORAL_TABLET | Freq: Two times a day (BID) | ORAL | Status: DC
  Administered 2013-05-14 – 2013-05-19 (×11): 1 mg via ORAL
  Filled 2013-05-14 (×11): qty 1

## 2013-05-14 MED ORDER — VENLAFAXINE HCL 37.5 MG PO TABS
37.5000 mg | ORAL_TABLET | Freq: Two times a day (BID) | ORAL | Status: DC
  Administered 2013-05-14 – 2013-05-19 (×11): 37.5 mg via ORAL
  Filled 2013-05-14 (×13): qty 1

## 2013-05-14 NOTE — ED Notes (Signed)
Patient arrives here from home with complaint of worsening shortness of breath starting approximately 3 days ago. Also complaining of headache, cough, fevers up to 101, and chills.

## 2013-05-14 NOTE — Progress Notes (Addendum)
Pt had to stop continuous neb due to feeling nauseated. States she's had albuterol before and never had a problem. States she's still feeling like she's having some SOB, but is feeling "a hair bit better". Pt wears 2lpm McGrath at home PRN, has diminished BBS, and according to her, nonproductive cough. Pt states she has SOB with exertion, but otherwise doesn't appear to be in any obvious respiratory distress. Attempted Peak flow with pt since she was able to complete about half of her breathing treatment. Pt gave good effort with Peak flow, but wasn't able to move the yellow indicator. After peak flow attempt, pt started coughing, and was able to cough up a small amount of thin, clear/white secretions. HR still elevated at this time. RT will continue to monitor.

## 2013-05-14 NOTE — ED Notes (Signed)
Pt transported with mask to [phone]

## 2013-05-14 NOTE — H&P (Signed)
[facility] History and Physical  Mckenzie Mclaughlin [id number] DOB: [birth year] DOA: 05/14/2013  Referring physician: EDP PCP: Elsie Stain, MD   Chief Complaint: fever, cough, shortness of breath  HPI: Mckenzie Mclaughlin is a 73 y.o. female with PMH of CAD, COPD on home O2 as needed, esophageal stricture, HTN was in his usual state of health till 3 days ago.  She started experiencing fevers, chills, cough, shortness of breath and headache which has progressed in the last 3 days. She reported mild wheezing 2 days ago but not any more. She denies any chest pain In ER, found to have tachycardic.  Review of Systems:  Constitutional:  No weight loss, night sweats, Fevers, chills, fatigue.  HEENT:  No headaches, Difficulty swallowing,Tooth/dental problems,Sore throat,  No sneezing, itching, ear ache, nasal congestion, post nasal drip,  Cardio-vascular:  No chest pain, Orthopnea, PND, swelling in lower extremities, anasarca, dizziness, palpitations  GI:  No heartburn, indigestion, abdominal pain, nausea, vomiting, diarrhea, change in bowel habits, loss of appetite  Resp:  No shortness of breath with exertion or at rest. No excess mucus, no productive cough, No non-productive cough, No coughing up of blood.No change in color of mucus.No wheezing.No chest wall deformity  Skin:  no rash or lesions.  GU:  no dysuria, change in color of urine, no urgency or frequency. No flank pain.  Musculoskeletal:  No joint pain or swelling. No decreased range of motion. No back pain.  Psych:  No change in mood or affect. No depression or anxiety. No memory loss.   Past Medical History  Diagnosis Date  . COPD, severe 07/29/98  . Coronary artery disease 01/22/99    Non Q-wave MI, stent RCA  . Hypertension     03/30/00  . History of ETT 08/03/09    Myoview Normal  . History of ETT 05/1999    Cardiolite pos ETT, neg Cardiolite  . Hx of cardiac catheterization 08/29/01    60% RCA 0/w 20-30%  lesions  . Hx of cardiac catheterization 01/22/99    w/Stent 90% RCA lesion  . History of ETT 09/02/01    Normal  . History of ETT 04/28/2006    Myoview normal EF 83%  . History of CT scan of head 08/02/09    w/o mild age appropr atrophy  . History of blurry vision 05/06-05/10/2009    Hospital [facility] CP R/O'D Blurry vision, smoking  . History of ETT 04/10/11    normal EF and no ischemia per Dr. Saralyn Pilar  . COPD (chronic obstructive pulmonary disease)   . Anxiety   . Shortness of breath   . Pneumothorax 2/14  . Esophageal dilatation     Pt needs appple sauce to take meds.  . Esophageal stricture     PT needs apple sauce to take meds  . GERD (gastroesophageal reflux disease) 08/29/98    severe-no meds now  . On home oxygen therapy     as needed-has a travel tank  . Complication of anesthesia   . PONV (postoperative nausea and vomiting)     ad breast remocved and see was under anesthesia a long time  . Anginal pain     see dr [name]  "Spams"  . Dysrhythmia     Palpations at times. last time 10/29 ish 2014  . Mental disorder   . Depression   . Head injury, acute, with loss of consciousness 11/2012    unsure how long  . Constipation   . Cancer  side of head- skin cancer, squamous cell ca on left foot.  Marland Kitchen History of blood transfusion    Past Surgical History  Procedure Laterality Date  . Laminectomy  1976    Disc Removal X 2 Lumbar  . Esophageal dilation  06/00    EGD  . Oophorectomy    . Abdominal hysterectomy    . Colonoscopy    . Mastectomy  03/1976    Bilateral due FCBD with implants [facility])  . Carotid stent Right 2000  . Back surgery    . Tissue expander placement Right 02/03/2013    Procedure: REMOVAL OF RIGHT BREAST IMPLANT AND IMPLANT MATERIAL  CAPSULECTOMY;  Surgeon: Irene Limbo, MD;  Location: [place];  Service: Plastics;  Laterality: Right;   Social History:  reports that she quit smoking about 14 months ago. Her smoking use included Cigarettes.  She has a 20 pack-year smoking history. She has never used smokeless tobacco. She reports that she does not drink alcohol or use illicit drugs.  Allergies  Allergen Reactions  . Amoxicillin Other (See Comments)    Shortness of breath.  . Sertraline Hcl     REACTION: trembling    Family History  Problem Relation Age of Onset  . Stroke Mother   . Heart disease Mother     MI  . Cancer Father     Lung  . COPD Brother   . Cancer Brother     Lung with mets 2011  . Heart disease Brother     CAD  . Heart disease Sister     cirrhosis due heart disease  . Cirrhosis Sister   . Colon cancer Neg Hx      Prior to Admission medications   Medication Sig Start Date End Date Taking? Authorizing Provider  albuterol (PROVENTIL HFA;VENTOLIN HFA) 108 (90 BASE) MCG/ACT inhaler Inhale 2 puffs into the lungs 4 (four) times daily as needed for wheezing or shortness of breath.   Yes Historical Provider, MD  aspirin 81 MG tablet Take 81 mg by mouth daily.   Yes Historical Provider, MD  budesonide-formoterol (SYMBICORT) 160-4.5 MCG/ACT inhaler Inhale 2 puffs into the lungs 2 (two) times daily.   Yes Historical Provider, MD  LORazepam (ATIVAN) 1 MG tablet Take 1 mg by mouth 2 (two) times daily.   Yes Historical Provider, MD  metoprolol succinate (TOPROL-XL) 50 MG 24 hr tablet Take 50 mg by mouth daily. Take with or immediately following a meal.   Yes Historical Provider, MD  simvastatin (ZOCOR) 10 MG tablet Take 10 mg by mouth at bedtime. 04/01/12  Yes Tonia Ghent, MD  tiotropium (SPIRIVA HANDIHALER) 18 MCG inhalation capsule Place 18 mcg into inhaler and inhale at bedtime.    Yes Historical Provider, MD  venlafaxine (EFFEXOR) 37.5 MG tablet Take 1 tablet (37.5 mg total) by mouth 2 (two) times daily. 10/04/12  Yes Tonia Ghent, MD   Physical Exam: Filed Vitals:   05/14/13 1203  BP: 139/71  Pulse:   Temp: 98.3 F (36.8 C)  Resp: 19    BP 139/71  Pulse 96  Temp(Src) 98.3 F (36.8 C) (Oral)   Resp 19  Ht 5\' 7"  (1.702 m)  Wt 72.576 kg (160 lb)  BMI 25.05 kg/m2  SpO2 97%  General:  Appears calm, resting in bed, no distress, AAOx3 Eyes: PERRL, normal lids, irises & conjunctiva ENT: grossly normal hearing, lips & tongue Neck: no LAD, masses or thyromegaly Cardiovascular: RRR, no m/r/g. No LE edema. Respiratory: poor  air movement B/L, rare fiant wheezes Abdomen: soft, NT, ND, BS present Skin: no rash or induration seen on limited exam Musculoskeletal: grossly normal tone BUE/BLE Psychiatric: grossly normal mood and affect, speech fluent and appropriate Neurologic: grossly non-focal.          Labs on Admission:  Basic Metabolic Panel:  Recent Labs Lab 05/14/13 0541  NA 137  K 4.9  CL 96  CO2 26  GLUCOSE 98  BUN 14  CREATININE 0.77  CALCIUM 9.5   Liver Function Tests: No results found for this basename: AST, ALT, ALKPHOS, BILITOT, PROT, ALBUMIN,  in the last 168 hours No results found for this basename: LIPASE, AMYLASE,  in the last 168 hours No results found for this basename: AMMONIA,  in the last 168 hours CBC:  Recent Labs Lab 05/14/13 0541  WBC 7.5  NEUTROABS 5.9  HGB 14.5  HCT 42.8  MCV 91.5  PLT 217   Cardiac Enzymes:  Recent Labs Lab 05/14/13 0541 05/14/13 0915  TROPONINI <0.30 <0.30    BNP (last 3 results)  Recent Labs  06/21/12 1045  PROBNP 1829.0*   CBG: No results found for this basename: GLUCAP,  in the last 168 hours  Radiological Exams on Admission: Dg Chest 2 View  05/14/2013   CLINICAL DATA:  73 year old female with chest pain. Initial encounter.  EXAM: CHEST  2 VIEW  COMPARISON:  Chest CTA 07/09/2012 and earlier.  FINDINGS: Stable lung volumes. Stable cardiac size and mediastinal contours. Visualized tracheal air column is within normal limits. No pneumothorax, pulmonary edema, pleural effusion or acute pulmonary opacity. Incidental breast implants. Osteopenia. Stable visualized osseous structures.  IMPRESSION: No acute  cardiopulmonary abnormality.   Electronically Signed   By: Lars Pinks M.D.   On: 05/14/2013 06:16    EKG: Independently reviewed. Sinus tachycardia, PVCs, repolarization abnormality  Assessment/Plan  1. Influenza with Mild COPD exacerbation -start Tamiflu -supportive care, IVF -xopenex and atrovent nebs -Hold off on steroids at this time  2. H/o CAd -stable -continue ASA/toprol/zocor -troponin x2 negative  3. COPD -as in #1 -hold off on steroids at this time  4. HTN -stable  DVT proph: lovenox  Code Status: Full Code Family Communication: d/w pt and spouse at bedside Disposition Plan: home when improved  Time spent: 70min  Gio Janoski [facility] Pager [phone]

## 2013-05-14 NOTE — ED Notes (Signed)
Entered room to check on patient, found patient feeling nauseated and complaining that the breathing treatment was making her feel worse. HR found to be 140. Continuous albuterol nebulization stopped. MD Nanavati informed.

## 2013-05-14 NOTE — ED Notes (Signed)
Patient sleeping at present.  

## 2013-05-14 NOTE — ED Notes (Signed)
Patient states she isn't really feeling any better states her head is hurting. Generalized bodyaches.

## 2013-05-14 NOTE — ED Notes (Signed)
EKG given to MD Ghim

## 2013-05-14 NOTE — ED Provider Notes (Signed)
CSN: [id number]     Arrival date & time 05/14/13  0370 History   First MD Initiated Contact with Patient 05/14/13 0539     Chief Complaint  Patient presents with  . Shortness of Breath  . Cough     (Consider location/radiation/quality/duration/timing/severity/associated sxs/prior Treatment) HPI Comments: Pt comes in with cc of dib. Pt has hx of severe COPD, CAD s/p CABG and HTN. Pt reports that for the last 3 days she has been having some worsening dyspnea. Pt also has increased wheezing, non productive cough. Dyspnea is exertional and at rest. Pt has been having fevers, chills, and headaches as well. She has no chest pain.   Patient is a 73 y.o. female presenting with shortness of breath and cough. The history is provided by the patient.  Shortness of Breath Associated symptoms: cough and wheezing   Associated symptoms: no abdominal pain, no chest pain, no headaches, no neck pain, no rash and no vomiting   Cough Associated symptoms: chills, shortness of breath and wheezing   Associated symptoms: no chest pain, no headaches and no rash     Past Medical History  Diagnosis Date  . COPD, severe 07/29/98  . Coronary artery disease 01/22/99    Non Q-wave MI, stent RCA  . Hypertension     03/30/00  . History of ETT 08/03/09    Myoview Normal  . History of ETT 05/1999    Cardiolite pos ETT, neg Cardiolite  . Hx of cardiac catheterization 08/29/01    60% RCA 0/w 20-30% lesions  . Hx of cardiac catheterization 01/22/99    w/Stent 90% RCA lesion  . History of ETT 09/02/01    Normal  . History of ETT 04/28/2006    Myoview normal EF 83%  . History of CT scan of head 08/02/09    w/o mild age appropr atrophy  . History of blurry vision 05/06-05/10/2009    Hospital [facility] CP R/O'D Blurry vision, smoking  . History of ETT 04/10/11    normal EF and no ischemia per Dr. Saralyn Pilar  . COPD (chronic obstructive pulmonary disease)   . Anxiety   . Shortness of breath   . Pneumothorax 2/14  .  Esophageal dilatation     Pt needs appple sauce to take meds.  . Esophageal stricture     PT needs apple sauce to take meds  . GERD (gastroesophageal reflux disease) 08/29/98    severe-no meds now  . On home oxygen therapy     as needed-has a travel tank  . Complication of anesthesia   . PONV (postoperative nausea and vomiting)     ad breast remocved and see was under anesthesia a long time  . Anginal pain     see dr [name]  "Spams"  . Dysrhythmia     Palpations at times. last time 10/29 ish 2014  . Mental disorder   . Depression   . Head injury, acute, with loss of consciousness 11/2012    unsure how long  . Constipation   . Cancer     side of head- skin cancer, squamous cell ca on left foot.  Marland Kitchen History of blood transfusion    Past Surgical History  Procedure Laterality Date  . Laminectomy  1976    Disc Removal X 2 Lumbar  . Esophageal dilation  06/00    EGD  . Oophorectomy    . Abdominal hysterectomy    . Colonoscopy    . Mastectomy  03/1976  Bilateral due FCBD with implants [facility])  . Carotid stent Right 2000  . Back surgery    . Tissue expander placement Right 02/03/2013    Procedure: REMOVAL OF RIGHT BREAST IMPLANT AND IMPLANT MATERIAL  CAPSULECTOMY;  Surgeon: Irene Limbo, MD;  Location: [place];  Service: Plastics;  Laterality: Right;   Family History  Problem Relation Age of Onset  . Stroke Mother   . Heart disease Mother     MI  . Cancer Father     Lung  . COPD Brother   . Cancer Brother     Lung with mets 2011  . Heart disease Brother     CAD  . Heart disease Sister     cirrhosis due heart disease  . Cirrhosis Sister   . Colon cancer Neg Hx    History  Substance Use Topics  . Smoking status: Former Smoker -- 0.40 packs/day for 50 years    Types: Cigarettes    Quit date: 02/28/2012  . Smokeless tobacco: Never Used     Comment: 1/4 PPD as of 2012  . Alcohol Use: No   OB History   Grav Para Term Preterm Abortions TAB SAB Ect Mult  Living                 Review of Systems  Constitutional: Positive for chills and activity change.  Respiratory: Positive for cough, shortness of breath and wheezing.   Cardiovascular: Negative for chest pain.  Gastrointestinal: Negative for nausea, vomiting and abdominal pain.  Genitourinary: Negative for dysuria.  Musculoskeletal: Negative for neck pain.  Skin: Negative for rash.  Neurological: Positive for weakness. Negative for headaches.  Hematological: Does not bruise/bleed easily.      Allergies  Amoxicillin and Sertraline hcl  Home Medications   Current Outpatient Rx  Name  Route  Sig  Dispense  Refill  . albuterol (PROVENTIL HFA;VENTOLIN HFA) 108 (90 BASE) MCG/ACT inhaler   Inhalation   Inhale 2 puffs into the lungs 4 (four) times daily as needed for wheezing or shortness of breath.         Marland Kitchen aspirin 81 MG tablet   Oral   Take 81 mg by mouth daily.         . budesonide-formoterol (SYMBICORT) 160-4.5 MCG/ACT inhaler   Inhalation   Inhale 2 puffs into the lungs 2 (two) times daily.         Marland Kitchen LORazepam (ATIVAN) 1 MG tablet   Oral   Take 1 mg by mouth 2 (two) times daily.         . metoprolol succinate (TOPROL-XL) 50 MG 24 hr tablet   Oral   Take 50 mg by mouth daily. Take with or immediately following a meal.         . simvastatin (ZOCOR) 10 MG tablet   Oral   Take 10 mg by mouth at bedtime.         Marland Kitchen tiotropium (SPIRIVA HANDIHALER) 18 MCG inhalation capsule   Inhalation   Place 18 mcg into inhaler and inhale at bedtime.          Marland Kitchen venlafaxine (EFFEXOR) 37.5 MG tablet   Oral   Take 1 tablet (37.5 mg total) by mouth 2 (two) times daily.          BP 153/54  Pulse 132  Temp(Src) 98.2 F (36.8 C) (Oral)  Resp 23  Ht 5\' 7"  (1.702 m)  Wt 160 lb (72.576 kg)  BMI 25.05 kg/m2  SpO2  97% Physical Exam  Nursing note and vitals reviewed. Constitutional: She is oriented to person, place, and time. She appears well-developed and  well-nourished.  HENT:  Head: Normocephalic and atraumatic.  Eyes: EOM are normal. Pupils are equal, round, and reactive to light.  Neck: Neck supple. No JVD present.  Cardiovascular: Normal heart sounds.   tachycardia  Pulmonary/Chest: Effort normal. No respiratory distress. She has wheezes.  Mild rhonchus breath sounds.  Abdominal: Soft. She exhibits no distension. There is no tenderness. There is no rebound and no guarding.  Musculoskeletal: She exhibits no edema.  Neurological: She is alert and oriented to person, place, and time.  Skin: Skin is warm and dry.    ED Course  Procedures (including critical care time) Labs Review Labs Reviewed  CBC WITH DIFFERENTIAL - Abnormal; Notable for the following:    Neutrophils Relative % 79 (*)    Lymphocytes Relative 9 (*)    All other components within normal limits  BASIC METABOLIC PANEL - Abnormal; Notable for the following:    GFR calc non Af Amer 83 (*)    All other components within normal limits  TROPONIN I   Imaging Review Dg Chest 2 View  05/14/2013   CLINICAL DATA:  73 year old female with chest pain. Initial encounter.  EXAM: CHEST  2 VIEW  COMPARISON:  Chest CTA 07/09/2012 and earlier.  FINDINGS: Stable lung volumes. Stable cardiac size and mediastinal contours. Visualized tracheal air column is within normal limits. No pneumothorax, pulmonary edema, pleural effusion or acute pulmonary opacity. Incidental breast implants. Osteopenia. Stable visualized osseous structures.  IMPRESSION: No acute cardiopulmonary abnormality.   Electronically Signed   By: Lars Pinks M.D.   On: 05/14/2013 06:16    EKG Interpretation    Date/Time:  Sunday May 14 2013 07:11:23 EST Ventricular Rate:  139 PR Interval:  21 QRS Duration: 81 QT Interval:  286 QTC Calculation: 435 R Axis:   102 Text Interpretation:  Sinus tachycardia Ventricular premature complex Consider right atrial enlargement Right axis deviation Anteroseptal infarct, old  Repol abnrm suggests ischemia, diffuse leads Confirmed by Kathrynn Humble, MD, Ambrie Carte ([id number]) on 05/14/2013 7:57:35 AM            MDM   Final diagnoses:  None    Pt comes in with cc of dib. Pt has worsening dib, with cough, chills, fevers. She has hx of COPD, and exam is consistent with mild COPD exacerbation. She has some flu like sx, influeza swab sent as well. Pt is tachycardic - i think mostly due to her underlying infectious etiology and probably meds.  EKG shows some PVCs, tachycardia and ST depressions etc - no chest pain - likely rate dependent. Will monitor closely, and admit.  Varney Biles, MD 05/14/13 2329

## 2013-05-14 NOTE — ED Notes (Signed)
Report called to 3-w room still being cleaned.

## 2013-05-14 NOTE — ED Notes (Signed)
Patient states she is breathing better just c/o headache

## 2013-05-15 DIAGNOSIS — R0789 Other chest pain: Secondary | ICD-10-CM

## 2013-05-15 LAB — BASIC METABOLIC PANEL
BUN: 17 mg/dL (ref 6–23)
CALCIUM: 9.3 mg/dL (ref 8.4–10.5)
CO2: 27 mEq/L (ref 19–32)
CREATININE: 0.65 mg/dL (ref 0.50–1.10)
Chloride: 101 mEq/L (ref 96–112)
GFR calc Af Amer: >90 mL/min (ref >90)
GFR calc non Af Amer: 88 mL/min — ABNORMAL LOW (ref >90)
Glucose, Bld: 83 mg/dL (ref 70–99)
Potassium: 4.6 mEq/L (ref 3.7–5.3)
Sodium: 138 mEq/L (ref 137–147)

## 2013-05-15 LAB — CBC
HCT: 40.7 % (ref 36.0–46.0)
Hemoglobin: 13.5 g/dL (ref 12.0–15.0)
MCH: 30.3 pg (ref 26.0–34.0)
MCHC: 33.2 g/dL (ref 30.0–36.0)
MCV: 91.5 fL (ref 78.0–100.0)
PLATELETS: 198 10*3/uL (ref 150–400)
RBC: 4.45 MIL/uL (ref 3.87–5.11)
RDW: 13 % (ref 11.5–15.5)
WBC: 7.9 10*3/uL (ref 4.0–10.5)

## 2013-05-15 MED ORDER — LEVALBUTEROL HCL 0.63 MG/3ML IN NEBU: 0.6300 mg | INHALATION_SOLUTION | Freq: Four times a day (QID) | RESPIRATORY_TRACT | Status: DC | PRN

## 2013-05-15 MED ORDER — LEVALBUTEROL HCL 0.63 MG/3ML IN NEBU
0.6300 mg | INHALATION_SOLUTION | Freq: Two times a day (BID) | RESPIRATORY_TRACT | Status: DC
  Administered 2013-05-16 – 2013-05-19 (×7): 0.63 mg via RESPIRATORY_TRACT
  Filled 2013-05-15 (×13): qty 3

## 2013-05-15 MED ORDER — METHYLPREDNISOLONE SODIUM SUCC 125 MG IJ SOLR
60.0000 mg | Freq: Three times a day (TID) | INTRAMUSCULAR | Status: DC
  Administered 2013-05-15 – 2013-05-16 (×4): 60 mg via INTRAVENOUS
  Filled 2013-05-15 (×6): qty 0.96

## 2013-05-15 MED ORDER — IPRATROPIUM BROMIDE 0.02 % IN SOLN
0.5000 mg | Freq: Two times a day (BID) | RESPIRATORY_TRACT | Status: DC
  Administered 2013-05-16 – 2013-05-19 (×7): 0.5 mg via RESPIRATORY_TRACT
  Filled 2013-05-15 (×7): qty 2.5

## 2013-05-15 NOTE — Progress Notes (Signed)
Utilization review completed.  

## 2013-05-15 NOTE — Progress Notes (Signed)
[facility] PROGRESS NOTE  Mckenzie Mclaughlin OIZ:[id number] DOB: Mar 30, 1941 DOA: 05/14/2013 PCP: Elsie Stain, MD  Assessment/Plan: 1. Influenza with COPD exacerbation  -continue Tamiflu -continue IV solumedrol, nebs  -supportive care, IVF  -xopenex and atrovent nebs   2. H/o CAD  -stable  -continue ASA/toprol/zocor  -troponin x2 negative   3. COPD  -as in #1   4. HTN  -stable   DVT proph: lovenox  Code Status: Full Code Family Communication: none at bedside Disposition Plan: home when improved     HPI/Subjective: Still dyspneic, some wheezes  Objective: Filed Vitals:   05/15/13 1431  BP: 149/63  Pulse: 95  Temp: 99.6 F (37.6 C)  Resp: 17   No intake or output data in the 24 hours ending 05/15/13 1533 Filed Weights   05/14/13 0534  Weight: 72.576 kg (160 lb)    Exam:   General:  AAOx3, no distress  Cardiovascular: S1S2/RRR  Respiratory: poor air movement B/L, occasional wheezes  Abdomen: soft, NT, BS present  Musculoskeletal: no edema c/c   Data Reviewed: Basic Metabolic Panel:  Recent Labs Lab 05/14/13 0541 05/15/13 0600  NA 137 138  K 4.9 4.6  CL 96 101  CO2 26 27  GLUCOSE 98 83  BUN 14 17  CREATININE 0.77 0.65  CALCIUM 9.5 9.3   Liver Function Tests: No results found for this basename: AST, ALT, ALKPHOS, BILITOT, PROT, ALBUMIN,  in the last 168 hours No results found for this basename: LIPASE, AMYLASE,  in the last 168 hours No results found for this basename: AMMONIA,  in the last 168 hours CBC:  Recent Labs Lab 05/14/13 0541 05/15/13 0600  WBC 7.5 7.9  NEUTROABS 5.9  --   HGB 14.5 13.5  HCT 42.8 40.7  MCV 91.5 91.5  PLT 217 198   Cardiac Enzymes:  Recent Labs Lab 05/14/13 0541 05/14/13 0915  TROPONINI <0.30 <0.30   BNP (last 3 results)  Recent Labs  06/21/12 1045  PROBNP 1829.0*   CBG: No results found for this basename: GLUCAP,  in the last 168 hours  No results found for this or any previous  visit (from the past 240 hour(s)).   Studies: Dg Chest 2 View  05/14/2013   CLINICAL DATA:  73 year old female with chest pain. Initial encounter.  EXAM: CHEST  2 VIEW  COMPARISON:  Chest CTA 07/09/2012 and earlier.  FINDINGS: Stable lung volumes. Stable cardiac size and mediastinal contours. Visualized tracheal air column is within normal limits. No pneumothorax, pulmonary edema, pleural effusion or acute pulmonary opacity. Incidental breast implants. Osteopenia. Stable visualized osseous structures.  IMPRESSION: No acute cardiopulmonary abnormality.   Electronically Signed   By: Lars Pinks M.D.   On: 05/14/2013 06:16    Scheduled Meds: . aspirin  81 mg Oral Daily  . budesonide-formoterol  2 puff Inhalation BID  . enoxaparin (LOVENOX) injection  40 mg Subcutaneous Q24H  . ipratropium  0.5 mg Nebulization QID  . levalbuterol  0.63 mg Nebulization QID  . LORazepam  1 mg Oral BID  . methylPREDNISolone (SOLU-MEDROL) injection  60 mg Intravenous Q8H  . metoprolol succinate  50 mg Oral Daily  . oseltamivir  75 mg Oral BID  . simvastatin  10 mg Oral QHS  . venlafaxine  37.5 mg Oral BID   Continuous Infusions: . sodium chloride 50 mL/hr at 05/15/13 1037    Active Problems:   COPD with exacerbation   Influenza    Time spent: 60min    Jaggar Benko  [facility] Pager [phone] If 7PM-7AM, please contact night-coverage at [email or web address], password [facility] 05/15/2013, 3:33 PM  LOS: 1 day

## 2013-05-16 MED ORDER — METHYLPREDNISOLONE SODIUM SUCC 40 MG IJ SOLR
40.0000 mg | Freq: Three times a day (TID) | INTRAMUSCULAR | Status: DC
  Administered 2013-05-16 – 2013-05-18 (×6): 40 mg via INTRAVENOUS
  Filled 2013-05-16 (×8): qty 1

## 2013-05-16 NOTE — Evaluation (Signed)
Physical Therapy Evaluation Patient Details Name: Mckenzie Mclaughlin MRN: [id number] DOB: Nov 06, 1940 Today's Date: 05/16/2013 Time: 8416-6063 PT Time Calculation (min): 24 min  PT Assessment / Plan / Recommendation History of Present Illness  [place] Pai is a 73 y.o. female with PMH of CAD, COPD on home O2 as needed, esophageal stricture, HTN was in his usual state of health till 3 days ago.    Clinical Impression  Pt demonstrates decreased activity tolerance with extreme dyspnea (3-4/4 with ambulation).  Ambulated with and without O2 as pt states she does not wear O2 at home all the time.  Note she desats to 77% on RA with amb, however was able to maintain sats in the 90's while ambulating on 2LO2.  Feel that pt will need to wear O2 at all times at home and discussed findings with RN.  Pt will benefit from continued acute PT in order to address deficits.  PT recommends HHPT for follow up at D/C and may also benefit from cardiopulmonary rehab due to chronic COPD.      PT Assessment  Patient needs continued PT services    Follow Up Recommendations  Home health PT    Does the patient have the potential to tolerate intense rehabilitation      Barriers to Discharge        Equipment Recommendations  Rolling walker with 5" wheels (may benefit from rollator)    Recommendations for Other Services     Frequency Min 3X/week    Precautions / Restrictions Precautions Precautions: Fall Precaution Comments: Monitor O2, extremely dyspneic Restrictions Weight Bearing Restrictions: No   Pertinent Vitals/Pain No pain      Mobility  Bed Mobility Overal bed mobility: Needs Assistance Bed Mobility: Supine to Sit;Sit to Supine Supine to sit: Supervision Sit to supine: Supervision General bed mobility comments: Supervision for safety, requires increased time and effort with use of handrails to sit at EOB.  Transfers Overall transfer level: Needs assistance Equipment used: None;Rolling  walker (2 wheeled) Transfers: Sit to/from Stand Sit to Stand: Min guard General transfer comment: Min/guard for mild steadying with cues for hand placement.  performed with and without RW Ambulation/Gait Ambulation/Gait assistance: Min guard;Min assist Ambulation Distance (Feet): 40 Feet (then another 60') Assistive device: None;Rolling walker (2 wheeled) Gait Pattern/deviations: Step-through pattern;Decreased stride length Gait velocity: decreased General Gait Details: Pt states she does not use O2 all the time at home, therefore had pt perform bed mobility without supplemental O2.  Note SaO2 at 91%, therefore performed short distance of ambulation without AD and on RA with pt demonstrating 4/4 dyspnea and desating to 77%.  Reapplied oxygen to allow pts sats to increase to 90's.  Pt increased sats to 95% on 2L O2.  Then supplied pt with RW and ambulated again with RW for energy conservation and with 2LO2.  Pts sats remained in 90's throughout, however continues to demonstrate 3/4 dyspnea.  RN notified and also notified that pt states she doesn't wear O2 all the time at home.     Exercises     PT Diagnosis: Difficulty walking;Generalized weakness  PT Problem List: Decreased strength;Decreased activity tolerance;Decreased balance;Decreased mobility;Decreased knowledge of use of DME;Decreased knowledge of precautions;Cardiopulmonary status limiting activity PT Treatment Interventions: DME instruction;Gait training;Stair training;Functional mobility training;Therapeutic activities;Therapeutic exercise;Balance training;Patient/family education     PT Goals(Current goals can be found in the care plan section) Acute Rehab PT Goals Patient Stated Goal: to return home PT Goal Formulation: With patient Time For Goal Achievement:  05/23/13 Potential to Achieve Goals: Good  Visit Information  Last PT Received On: 05/16/13 Assistance Needed: +1 History of Present Illness: MARISSAH Mclaughlin is a 73  y.o. female with PMH of CAD, COPD on home O2 as needed, esophageal stricture, HTN was in his usual state of health till 3 days ago.         Prior [place] expects to be discharged to:: Private residence Living Arrangements: Spouse/significant other Available Help at Discharge: Family;Available 24 hours/day Type of Home: House Home Access: Stairs to enter [employer] of Steps: 2 Entrance Stairs-Rails: Right;Left Home Layout: One level Home Equipment: Cane - single point Prior Function Level of Independence: Independent Communication Communication: No difficulties    Cognition  Cognition Arousal/Alertness: Awake/alert Behavior During Therapy: WFL for tasks assessed/performed Overall Cognitive Status: Within Functional Limits for tasks assessed    Extremity/Trunk Assessment Lower Extremity Assessment Lower Extremity Assessment: Generalized weakness Cervical / Trunk Assessment Cervical / Trunk Assessment: Normal   Balance    End of Session PT - End of Session Equipment Utilized During Treatment: Gait belt;Oxygen Activity Tolerance: Patient limited by fatigue Patient left: in bed;with call bell/phone within reach Nurse Communication: Mobility status (O2 sats)  GP     Denice Bors 05/16/2013, 3:31 PM

## 2013-05-16 NOTE — Progress Notes (Addendum)
[facility] PROGRESS NOTE  Mckenzie Mclaughlin YPP:[id number] DOB: 10/27/1940 DOA: 05/14/2013 PCP: Elsie Stain, MD Brief narrative: Mckenzie Mclaughlin is a 73 y.o. female with PMH of CAD, COPD on home O2 as needed, esophageal stricture, HTN was in his usual state of health till 3 days ago.  She started experiencing fevers, chills, cough, shortness of breath and wheezing. On further workup noted to be positive for Influenza, On IV solumedrol and Tamiflu, improving  Assessment/Plan: 1. Influenza with COPD exacerbation  -continue Tamiflu -continue IV solumedrol cut down dose, nebs  -improving, wean O2 -supportive care, IVF  -xopenex and atrovent nebs   2. H/o CAD  -stable  -continue ASA/toprol/zocor  -troponin x2 negative   3. COPD  -as in #1   4. HTN  -stable   DVT proph: lovenox  DC tele ambulate  Code Status: Full Code Family Communication: none at bedside Disposition Plan: home in 1-2days     HPI/Subjective: Breathing improving,  Wheezing less  Objective: Filed Vitals:   05/16/13 0653  BP: 145/69  Pulse: 83  Temp: 98.3 F (36.8 C)  Resp: 16   No intake or output data in the 24 hours ending 05/16/13 1334 Filed Weights   05/14/13 0534  Weight: 72.576 kg (160 lb)    Exam:   General:  AAOx3, no distress, frail elderly  Cardiovascular: S1S2/RRR  Respiratory: poor air movement B/L, occasional wheezes  Abdomen: soft, NT, BS present  Musculoskeletal: no edema c/c   Data Reviewed: Basic Metabolic Panel:  Recent Labs Lab 05/14/13 0541 05/15/13 0600  NA 137 138  K 4.9 4.6  CL 96 101  CO2 26 27  GLUCOSE 98 83  BUN 14 17  CREATININE 0.77 0.65  CALCIUM 9.5 9.3   Liver Function Tests: No results found for this basename: AST, ALT, ALKPHOS, BILITOT, PROT, ALBUMIN,  in the last 168 hours No results found for this basename: LIPASE, AMYLASE,  in the last 168 hours No results found for this basename: AMMONIA,  in the last 168  hours CBC:  Recent Labs Lab 05/14/13 0541 05/15/13 0600  WBC 7.5 7.9  NEUTROABS 5.9  --   HGB 14.5 13.5  HCT 42.8 40.7  MCV 91.5 91.5  PLT 217 198   Cardiac Enzymes:  Recent Labs Lab 05/14/13 0541 05/14/13 0915  TROPONINI <0.30 <0.30   BNP (last 3 results)  Recent Labs  06/21/12 1045  PROBNP 1829.0*   CBG: No results found for this basename: GLUCAP,  in the last 168 hours  No results found for this or any previous visit (from the past 240 hour(s)).   Studies: No results found.  Scheduled Meds: . aspirin  81 mg Oral Daily  . budesonide-formoterol  2 puff Inhalation BID  . enoxaparin (LOVENOX) injection  40 mg Subcutaneous Q24H  . ipratropium  0.5 mg Nebulization BID  . levalbuterol  0.63 mg Nebulization BID  . LORazepam  1 mg Oral BID  . methylPREDNISolone (SOLU-MEDROL) injection  40 mg Intravenous Q8H  . metoprolol succinate  50 mg Oral Daily  . oseltamivir  75 mg Oral BID  . simvastatin  10 mg Oral QHS  . venlafaxine  37.5 mg Oral BID   Continuous Infusions: . sodium chloride 50 mL/hr at 05/15/13 2111    Active Problems:   COPD with exacerbation   Influenza    Time spent: 15min    [facility] Pager [phone] If 7PM-7AM, please contact night-coverage at [email or web address], password [facility] 05/16/2013, 1:34  PM  LOS: 2 days

## 2013-05-17 LAB — CBC
HCT: 40.5 % (ref 36.0–46.0)
Hemoglobin: 13.5 g/dL (ref 12.0–15.0)
MCH: 30.4 pg (ref 26.0–34.0)
MCHC: 33.3 g/dL (ref 30.0–36.0)
MCV: 91.2 fL (ref 78.0–100.0)
PLATELETS: 193 10*3/uL (ref 150–400)
RBC: 4.44 MIL/uL (ref 3.87–5.11)
RDW: 12.7 % (ref 11.5–15.5)
WBC: 5.2 10*3/uL (ref 4.0–10.5)

## 2013-05-17 LAB — BASIC METABOLIC PANEL
BUN: 17 mg/dL (ref 6–23)
CALCIUM: 9.5 mg/dL (ref 8.4–10.5)
CO2: 30 mEq/L (ref 19–32)
Chloride: 101 mEq/L (ref 96–112)
Creatinine, Ser: 0.57 mg/dL (ref 0.50–1.10)
GFR calc Af Amer: >90 mL/min (ref >90)
Glucose, Bld: 109 mg/dL — ABNORMAL HIGH (ref 70–99)
Potassium: 4.2 mEq/L (ref 3.7–5.3)
Sodium: 138 mEq/L (ref 137–147)

## 2013-05-17 MED ORDER — ASPIRIN 81 MG PO CHEW
81.0000 mg | CHEWABLE_TABLET | Freq: Every day | ORAL | Status: DC
  Administered 2013-05-17 – 2013-05-19 (×3): 81 mg via ORAL
  Filled 2013-05-17 (×2): qty 1

## 2013-05-17 NOTE — Progress Notes (Signed)
Physical Therapy Treatment Patient Details Name: Mckenzie Mclaughlin MRN: [id number] DOB: 12-31-1940 Today's Date: 05/17/2013 Time: 0539-7673 PT Time Calculation (min): 20 min  PT Assessment / Plan / Recommendation  History of Present Illness Mckenzie Mclaughlin is a 73 y.o. female with PMH of CAD, COPD on home O2 as needed, esophageal stricture, HTN was in his usual state of health till 3 days ago.     PT Comments   Pt able to increase gait distance today, feels comfortable with use of RW, spO2 >91% during gait with cues for deep breathing and 3LO2.    Follow Up Recommendations  Home health PT     Does the patient have the potential to tolerate intense rehabilitation     Barriers to Discharge        Equipment Recommendations  Rolling walker with 5" wheels    Recommendations for Other Services    Frequency Min 3X/week   Progress towards PT Goals Progress towards PT goals: Progressing toward goals  Plan Current plan remains appropriate    Precautions / Restrictions Precautions Precautions: Fall Precaution Comments: monitor O2 Restrictions Weight Bearing Restrictions: No   Pertinent Vitals/Pain No c/o pain, spO2 >91% during treatment on 3LO2    Mobility  Bed Mobility General bed mobility comments: Supervision for safety, requires increased time and effort with use of handrails to sit at EOB.  Transfers Equipment used: Rolling walker (2 wheeled) Sit to Stand: Supervision General transfer comment: cues for safe hand placement Ambulation/Gait Ambulation/Gait assistance: Supervision Ambulation Distance (Feet): 100 Feet Assistive device: Rolling walker (2 wheeled) Gait velocity interpretation: Below normal speed for age/gender General Gait Details: pt able to gait 100' with supervision with good use of RW, spO2 91% throughout session on 3LO2 and cues for deep breathing    Exercises     PT Diagnosis:    PT Problem List:   PT Treatment Interventions:     PT Goals (current  goals can now be found in the care plan section)    Visit Information  Last PT Received On: 05/17/13 Assistance Needed: +1 History of Present Illness: Mckenzie Mclaughlin is a 73 y.o. female with PMH of CAD, COPD on home O2 as needed, esophageal stricture, HTN was in his usual state of health till 3 days ago.      Subjective Data      Cognition  Cognition Arousal/Alertness: Awake/alert Behavior During Therapy: WFL for tasks assessed/performed Overall Cognitive Status: Within Functional Limits for tasks assessed    Balance  Balance Overall balance assessment: Modified Independent General Comments General comments (skin integrity, edema, etc.): pt able to balance for toileting and hygiene with RW without LOB  End of Session PT - End of Session Equipment Utilized During Treatment: Oxygen Activity Tolerance: Patient tolerated treatment well Patient left: in chair;with call bell/phone within reach Nurse Communication: Mobility status   GP     Mckenzie Mclaughlin 05/17/2013, 11:27 AM

## 2013-05-17 NOTE — Progress Notes (Signed)
[facility] PROGRESS NOTE  Mckenzie Mclaughlin WNU:[id number] DOB: 08-02-1940 DOA: 05/14/2013 PCP: Elsie Stain, MD  Assessment/Plan: 1. Influenza with COPD exacerbation  -continue Tamiflu  -continue IV solumedrol cut down dose, nebs  -improving, wean O2  -supportive care, IVF  -xopenex and atrovent nebs   2. H/o CAD  -stable  -continue ASA/toprol/zocor  -troponin x2 negative   3. COPD  -as in #1   4. HTN  -stable   DVT proph: lovenox   Code Status: Full Family Communication: No family at bedside Disposition Plan:  pending improvement in respiratory condition   Consultants:  None  Procedures:  None  Antibiotics:  None  Anti infectives: Tamiflu  HPI/Subjective: No new complaints. No acute issues overnight  Objective: Filed Vitals:   05/17/13 1406  BP: 144/80  Pulse: 81  Temp: 98.9 F (37.2 C)  Resp: 18   No intake or output data in the 24 hours ending 05/17/13 1551 Filed Weights   05/14/13 0534 05/17/13 0657  Weight: 72.576 kg (160 lb) 72.189 kg (159 lb 2.4 oz)    Exam:   General:  A shunt in no acute distress, alert and away  Cardiovascular: Normal S1 and S2, no murmur  Respiratory: Nasal cannula in place, no wheezes, no rhales  Abdomen: Soft, nondistended, nontender  Musculoskeletal: No cyanosis or clubbing   Data Reviewed: Basic Metabolic Panel:  Recent Labs Lab 05/14/13 0541 05/15/13 0600 05/17/13 0545  NA 137 138 138  K 4.9 4.6 4.2  CL 96 101 101  CO2 26 27 30   GLUCOSE 98 83 109*  BUN 14 17 17   CREATININE 0.77 0.65 0.57  CALCIUM 9.5 9.3 9.5   Liver Function Tests: No results found for this basename: AST, ALT, ALKPHOS, BILITOT, PROT, ALBUMIN,  in the last 168 hours No results found for this basename: LIPASE, AMYLASE,  in the last 168 hours No results found for this basename: AMMONIA,  in the last 168 hours CBC:  Recent Labs Lab 05/14/13 0541 05/15/13 0600 05/17/13 0545  WBC 7.5 7.9 5.2  NEUTROABS 5.9  --    --   HGB 14.5 13.5 13.5  HCT 42.8 40.7 40.5  MCV 91.5 91.5 91.2  PLT 217 198 193   Cardiac Enzymes:  Recent Labs Lab 05/14/13 0541 05/14/13 0915  TROPONINI <0.30 <0.30   BNP (last 3 results)  Recent Labs  06/21/12 1045  PROBNP 1829.0*   CBG: No results found for this basename: GLUCAP,  in the last 168 hours  No results found for this or any previous visit (from the past 240 hour(s)).   Studies: No results found.  Scheduled Meds: . aspirin  81 mg Oral Daily  . budesonide-formoterol  2 puff Inhalation BID  . enoxaparin (LOVENOX) injection  40 mg Subcutaneous Q24H  . ipratropium  0.5 mg Nebulization BID  . levalbuterol  0.63 mg Nebulization BID  . LORazepam  1 mg Oral BID  . methylPREDNISolone (SOLU-MEDROL) injection  40 mg Intravenous Q8H  . metoprolol succinate  50 mg Oral Daily  . oseltamivir  75 mg Oral BID  . simvastatin  10 mg Oral QHS  . venlafaxine  37.5 mg Oral BID   Continuous Infusions: . sodium chloride 20 mL/hr at 05/17/13 1415    Active Problems:   COPD with exacerbation   Influenza    Time spent: > 35 minutes    Velvet Bathe  [facility] Pager [phone] If 7PM-7AM, please contact night-coverage at [email or web address], password [facility] 05/17/2013, 3:51 PM  LOS: 3 days

## 2013-05-18 DIAGNOSIS — J449 Chronic obstructive pulmonary disease, unspecified: Secondary | ICD-10-CM

## 2013-05-18 DIAGNOSIS — J441 Chronic obstructive pulmonary disease with (acute) exacerbation: Secondary | ICD-10-CM

## 2013-05-18 DIAGNOSIS — I10 Essential (primary) hypertension: Secondary | ICD-10-CM

## 2013-05-18 DIAGNOSIS — R6889 Other general symptoms and signs: Secondary | ICD-10-CM

## 2013-05-18 MED ORDER — MENTHOL 3 MG MT LOZG
1.0000 | LOZENGE | OROMUCOSAL | Status: DC | PRN
  Filled 2013-05-18: qty 9

## 2013-05-18 MED ORDER — PREDNISONE 50 MG PO TABS
50.0000 mg | ORAL_TABLET | Freq: Every day | ORAL | Status: DC
  Administered 2013-05-19: 50 mg via ORAL
  Filled 2013-05-18 (×2): qty 1

## 2013-05-18 NOTE — Progress Notes (Signed)
Utilization review completed.  

## 2013-05-18 NOTE — Progress Notes (Signed)
[facility] PROGRESS NOTE  Mckenzie Mclaughlin YWV:[id number] DOB: [birth year] DOA: 05/14/2013 PCP: Elsie Stain, MD  Assessment/Plan: 1. Influenza with COPD exacerbation  -continue Tamiflu  -continue IV solumedrol cut down dose 05/17/13 -improving, wean O2  -supportive care  -xopenex and atrovent nebs    2. H/o CAD  -stable  -continue ASA/toprol/zocor  -troponin x2 negative   3. COPD  - Please see above.  4. HTN  -stable   DVT proph: lovenox   Code Status: Full Family Communication: No family at bedside. Discussed plan with patient Disposition Plan:  pending improvement in respiratory condition may discharge tomorrow   Consultants:  None  Procedures:  None  Antibiotics:  None  Anti infectives: Tamiflu  HPI/Subjective: No new complaints. No acute issues overnight. Pt requesting throat lozenges due to cough/sore throat.   Objective: Filed Vitals:   05/18/13 0624  BP: 158/75  Pulse: 87  Temp: 98.8 F (37.1 C)  Resp:    No intake or output data in the 24 hours ending 05/18/13 1221 Filed Weights   05/14/13 0534 05/17/13 0657  Weight: 160 lb (72.576 kg) 159 lb 2.4 oz (72.189 kg)    Exam:   General:  In no acute distress, alert and awake  Cardiovascular: Normal S1 and S2, no murmur  Respiratory: Nasal cannula in place, no wheezes, no rhales  Abdomen: Soft, nondistended, nontender  Musculoskeletal: No cyanosis or clubbing   Data Reviewed: Basic Metabolic Panel:  Recent Labs Lab 05/14/13 0541 05/15/13 0600 05/17/13 0545  NA 137 138 138  K 4.9 4.6 4.2  CL 96 101 101  CO2 26 27 30   GLUCOSE 98 83 109*  BUN 14 17 17   CREATININE 0.77 0.65 0.57  CALCIUM 9.5 9.3 9.5   Liver Function Tests: No results found for this basename: AST, ALT, ALKPHOS, BILITOT, PROT, ALBUMIN,  in the last 168 hours No results found for this basename: LIPASE, AMYLASE,  in the last 168 hours No results found for this basename: AMMONIA,  in the last 168  hours CBC:  Recent Labs Lab 05/14/13 0541 05/15/13 0600 05/17/13 0545  WBC 7.5 7.9 5.2  NEUTROABS 5.9  --   --   HGB 14.5 13.5 13.5  HCT 42.8 40.7 40.5  MCV 91.5 91.5 91.2  PLT 217 198 193   Cardiac Enzymes:  Recent Labs Lab 05/14/13 0541 05/14/13 0915  TROPONINI <0.30 <0.30   BNP (last 3 results)  Recent Labs  06/21/12 1045  PROBNP 1829.0*   CBG: No results found for this basename: GLUCAP,  in the last 168 hours  No results found for this or any previous visit (from the past 240 hour(s)).   Studies: No results found.  Scheduled Meds: . aspirin  81 mg Oral Daily  . budesonide-formoterol  2 puff Inhalation BID  . enoxaparin (LOVENOX) injection  40 mg Subcutaneous Q24H  . ipratropium  0.5 mg Nebulization BID  . levalbuterol  0.63 mg Nebulization BID  . LORazepam  1 mg Oral BID  . methylPREDNISolone (SOLU-MEDROL) injection  40 mg Intravenous Q8H  . metoprolol succinate  50 mg Oral Daily  . oseltamivir  75 mg Oral BID  . simvastatin  10 mg Oral QHS  . venlafaxine  37.5 mg Oral BID   Continuous Infusions: . sodium chloride 20 mL/hr at 05/17/13 1415    Active Problems:   COPD with exacerbation   Influenza    Time spent: > 35 minutes    Larwance Sachs  [facility]  Pager [phone] If 7PM-7AM, please contact night-coverage at [email or web address], password [facility] 05/18/2013, 12:21 PM  LOS: 4 days

## 2013-05-19 DIAGNOSIS — R6889 Other general symptoms and signs: Secondary | ICD-10-CM

## 2013-05-19 DIAGNOSIS — J441 Chronic obstructive pulmonary disease with (acute) exacerbation: Secondary | ICD-10-CM

## 2013-05-19 NOTE — Progress Notes (Signed)
PT Cancellation Note  Patient Details Name: MILLY GOGGINS MRN: [id number] DOB: 04-Aug-1940   Cancelled Treatment:    Reason Eval/Treat Not Completed: Patient declined, no reason specified.  Per pt she is going home today.  She has a RW and feels confident in her mobility.    Thanks,   Barbarann Ehlers. [place], [place], DPT [phone]   05/19/2013, 12:59 PM

## 2013-05-19 NOTE — Discharge Summary (Signed)
Physician Discharge [place] CBU:[id number] DOB: Aug 22, 1940 DOA: 05/14/2013  PCP: Elsie Stain, MD  Admit date: 05/14/2013 Discharge date: 05/19/2013  Time spent: > 35 minutes  Recommendations for Outpatient Follow-up:  1. Please followup with your primary care physician within the next one to 2 weeks or sooner should any new concerns arise 2. Continue to monitor blood pressures  Discharge Diagnoses:  Active Problems:   COPD with exacerbation   Influenza   Discharge Condition: Stable  Diet recommendation: Low sodium heart healthy  Filed Weights   05/14/13 0534 05/17/13 0657 05/19/13 0606  Weight: 72.576 kg (160 lb) 72.189 kg (159 lb 2.4 oz) 71.305 kg (157 lb 3.2 oz)    History of present illness:  Patient is a 73 year old Caucasian female past medical history CAD, COPD on home oxygen as needed, esophageal stricture, and hypertension. She presented to the ED complaining of fever, cough, and shortness of breath.  Hospital Course:  1. Influenza with COPD exacerbation  -Has completed Tamiflu 5 day regimen   2. H/o CAD  -continue ASA/toprol/zocor  -troponin x2 negative   3. COPD  - Improved, we'll continue home regimen and home oxygen as needed. - No wheezing on the day of discharge as such will discontinue prednisone on discharge - Has received Tamiflu for 5 days and completed adequate course   4. HTN  -stable  - Continue metoprolol  Procedures:  None  Consultations:  None  Discharge Exam: Filed Vitals:   05/19/13 0606  BP: 144/60  Pulse: 77  Temp: 99.3 F (37.4 C)  Resp:     General: Patient in no acute distress, alert and awake Cardiovascular: Regular rate and rhythm, no murmurs Respiratory: Clear to auscultation bilaterally, prolonged expiratory phase, nasal cannula in place, patient has no increased work of the breathing  Discharge Instructions  Discharge Orders   Future Orders Complete By Expires   Call MD for:  difficulty  breathing, headache or visual disturbances  As directed    Call MD for:  redness, tenderness, or signs of infection (pain, swelling, redness, odor or green/yellow discharge around incision site)  As directed    Call MD for:  temperature >100.4  As directed    Diet - low sodium heart healthy  As directed    Discharge instructions  As directed    Increase activity slowly  As directed        Medication List         albuterol 108 (90 BASE) MCG/ACT inhaler  Commonly known as:  PROVENTIL HFA;VENTOLIN HFA  Inhale 2 puffs into the lungs 4 (four) times daily as needed for wheezing or shortness of breath.     aspirin 81 MG tablet  Take 81 mg by mouth daily.     budesonide-formoterol 160-4.5 MCG/ACT inhaler  Commonly known as:  SYMBICORT  Inhale 2 puffs into the lungs 2 (two) times daily.     LORazepam 1 MG tablet  Commonly known as:  ATIVAN  Take 1 mg by mouth 2 (two) times daily.     metoprolol succinate 50 MG 24 hr tablet  Commonly known as:  TOPROL-XL  Take 50 mg by mouth daily. Take with or immediately following a meal.     simvastatin 10 MG tablet  Commonly known as:  ZOCOR  Take 10 mg by mouth at bedtime.     SPIRIVA HANDIHALER 18 MCG inhalation capsule  Generic drug:  tiotropium  Place 18 mcg into inhaler and inhale at bedtime.  venlafaxine 37.5 MG tablet  Commonly known as:  EFFEXOR  Take 1 tablet (37.5 mg total) by mouth 2 (two) times daily.       Allergies  Allergen Reactions  . Amoxicillin Other (See Comments)    Shortness of breath.  . Sertraline Hcl     REACTION: trembling      The results of significant diagnostics from this hospitalization (including imaging, microbiology, ancillary and laboratory) are listed below for reference.    Significant Diagnostic Studies: Dg Chest 2 View  05/14/2013   CLINICAL DATA:  73 year old female with chest pain. Initial encounter.  EXAM: CHEST  2 VIEW  COMPARISON:  Chest CTA 07/09/2012 and earlier.  FINDINGS: Stable  lung volumes. Stable cardiac size and mediastinal contours. Visualized tracheal air column is within normal limits. No pneumothorax, pulmonary edema, pleural effusion or acute pulmonary opacity. Incidental breast implants. Osteopenia. Stable visualized osseous structures.  IMPRESSION: No acute cardiopulmonary abnormality.   Electronically Signed   By: Lars Pinks M.D.   On: 05/14/2013 06:16    Microbiology: No results found for this or any previous visit (from the past 240 hour(s)).   Labs: Basic Metabolic Panel:  Recent Labs Lab 05/14/13 0541 05/15/13 0600 05/17/13 0545  NA 137 138 138  K 4.9 4.6 4.2  CL 96 101 101  CO2 26 27 30   GLUCOSE 98 83 109*  BUN 14 17 17   CREATININE 0.77 0.65 0.57  CALCIUM 9.5 9.3 9.5   Liver Function Tests: No results found for this basename: AST, ALT, ALKPHOS, BILITOT, PROT, ALBUMIN,  in the last 168 hours No results found for this basename: LIPASE, AMYLASE,  in the last 168 hours No results found for this basename: AMMONIA,  in the last 168 hours CBC:  Recent Labs Lab 05/14/13 0541 05/15/13 0600 05/17/13 0545  WBC 7.5 7.9 5.2  NEUTROABS 5.9  --   --   HGB 14.5 13.5 13.5  HCT 42.8 40.7 40.5  MCV 91.5 91.5 91.2  PLT 217 198 193   Cardiac Enzymes:  Recent Labs Lab 05/14/13 0541 05/14/13 0915  TROPONINI <0.30 <0.30   BNP: BNP (last 3 results)  Recent Labs  06/21/12 1045  PROBNP 1829.0*   CBG: No results found for this basename: GLUCAP,  in the last 168 hours     Signed:  Velvet Bathe  [facility] 05/19/2013, 11:23 AM

## 2013-05-19 NOTE — Progress Notes (Signed)
Spoke with Dr. Wendee Beavers concerning pt being dc'd on taper pack prednisone, Dr. Wendee Beavers stated he felt as though pt did not need one, no wheezing noted on his auscultation. I Instructed patient to follow with PCP in one week or sooner if needed. Carroll Kinds RN

## 2013-05-26 ENCOUNTER — Inpatient Hospital Stay: Payer: Self-pay | Admitting: Internal Medicine

## 2013-05-26 ENCOUNTER — Telehealth: Payer: Self-pay

## 2013-05-26 LAB — CBC
HCT: 40.9 % (ref 35.0–47.0)
HGB: 13 g/dL (ref 12.0–16.0)
MCH: 28.6 pg (ref 26.0–34.0)
MCHC: 31.8 g/dL — ABNORMAL LOW (ref 32.0–36.0)
MCV: 90 fL (ref 80–100)
Platelet: 280 10*3/uL (ref 150–440)
RBC: 4.55 10*6/uL (ref 3.80–5.20)
RDW: 12.8 % (ref 11.5–14.5)
WBC: 12.8 10*3/uL — ABNORMAL HIGH (ref 3.6–11.0)

## 2013-05-26 LAB — BASIC METABOLIC PANEL
Anion Gap: 4 — ABNORMAL LOW (ref 7–16)
BUN: 10 mg/dL (ref 7–18)
CALCIUM: 9.2 mg/dL (ref 8.5–10.1)
CHLORIDE: 101 mmol/L (ref 98–107)
Co2: 32 mmol/L (ref 21–32)
Creatinine: 0.73 mg/dL (ref 0.60–1.30)
GLUCOSE: 94 mg/dL (ref 65–99)
Osmolality: 273 (ref 275–301)
POTASSIUM: 3.6 mmol/L (ref 3.5–5.1)
Sodium: 137 mmol/L (ref 136–145)

## 2013-05-26 LAB — URINALYSIS, COMPLETE
BACTERIA: NONE SEEN
Bilirubin,UR: NEGATIVE
Blood: NEGATIVE
GLUCOSE, UR: NEGATIVE mg/dL (ref 0–75)
Ketone: NEGATIVE
Nitrite: NEGATIVE
Ph: 7 (ref 4.5–8.0)
Protein: NEGATIVE
RBC,UR: 1 /HPF (ref 0–5)
Specific Gravity: 1.015 (ref 1.003–1.030)
WBC UR: 6 /HPF (ref 0–5)

## 2013-05-26 LAB — TROPONIN I: Troponin-I: <0.02 ng/mL

## 2013-05-26 NOTE — Telephone Encounter (Signed)
Spoke with Dr Damita Dunnings and he advised pt needs to be eval at ED now; Marlowe Kays will let pt know and send f/u note to Dr Damita Dunnings.

## 2013-05-26 NOTE — Telephone Encounter (Signed)
Marlowe Kays with [facility] said since pt discharged one week ago from hospital for influenza pt cannot take 6 steps without exertion and SOB. No confusion, no CP or H/a or dizziness. Pt said her heart rate goes up to 140 with any exertion. Marlowe Kays had pt ck pulse ox and pulse while on phone;now  o2 sat 97% and pulse 102. Pt does not want to go out of home; pt wants medication. Marlowe Kays shows ED disposition. Advised pt to go to ED for eval; fast heart rate is unusual for pt. Pt said heart rate seems reg, not skipping as best she can tell.Please advise.

## 2013-05-26 NOTE — Telephone Encounter (Signed)
Patient Information:  Caller Name: Mclaughlin  Phone: [phone]  Patient: Mckenzie Mclaughlin, Mckenzie Mclaughlin  Gender: Female  DOB: 05-25-1940  Age: 73 Years  PCP: Elsie Stain Brigitte Pulse) [facility])  Office Follow Up:  Does the office need to follow up with this patient?: No  Instructions For The Office: N/A  RN Note:  Patient calling regarding SOB and increase HR, fatigue.  Office notified.  Per Dr. Damita Dunnings advised patient to go to ED now.  Patient aware and agrees to go to [facility] ED NOW.  States husband is with her and he will  drive.  Symptoms  Reason For Call & Symptoms: increase in SOB and HR since having Flu  Reviewed Health History In EMR: Yes  Reviewed Medications In EMR: Yes  Reviewed Allergies In EMR: Yes  Reviewed Surgeries / Procedures: Yes  Date of Onset of Symptoms: 05/19/2013  Guideline(s) Used:  Heart Rate and Heartbeat Questions  Disposition Per Guideline:   Go to ED Now  Reason For Disposition Reached:   Difficulty breathing  Advice Given:  N/A  Patient Will Follow Care Advice:  YES

## 2013-05-27 LAB — CBC WITH DIFFERENTIAL/PLATELET
BASOS ABS: 0 10*3/uL (ref 0.0–0.1)
Basophil %: 0.1 %
EOS ABS: 0 10*3/uL (ref 0.0–0.7)
EOS PCT: 0 %
HCT: 40.5 % (ref 35.0–47.0)
HGB: 13.1 g/dL (ref 12.0–16.0)
Lymphocyte #: 0.5 10*3/uL — ABNORMAL LOW (ref 1.0–3.6)
Lymphocyte %: 5.6 %
MCH: 29.1 pg (ref 26.0–34.0)
MCHC: 32.3 g/dL (ref 32.0–36.0)
MCV: 90 fL (ref 80–100)
MONO ABS: 0.1 x10 3/mm — AB (ref 0.2–0.9)
MONOS PCT: 0.6 %
Neutrophil #: 7.9 10*3/uL — ABNORMAL HIGH (ref 1.4–6.5)
Neutrophil %: 93.7 %
Platelet: 265 10*3/uL (ref 150–440)
RBC: 4.51 10*6/uL (ref 3.80–5.20)
RDW: 12.7 % (ref 11.5–14.5)
WBC: 8.5 10*3/uL (ref 3.6–11.0)

## 2013-05-27 LAB — TROPONIN I

## 2013-05-27 LAB — BASIC METABOLIC PANEL
ANION GAP: 3 — AB (ref 7–16)
BUN: 11 mg/dL (ref 7–18)
CALCIUM: 9.2 mg/dL (ref 8.5–10.1)
CHLORIDE: 103 mmol/L (ref 98–107)
CREATININE: 0.91 mg/dL (ref 0.60–1.30)
Co2: 33 mmol/L — ABNORMAL HIGH (ref 21–32)
EGFR (African American): >60
EGFR (Non-African Amer.): >60
Glucose: 145 mg/dL — ABNORMAL HIGH (ref 65–99)
OSMOLALITY: 280 (ref 275–301)
Potassium: 4.2 mmol/L (ref 3.5–5.1)
Sodium: 139 mmol/L (ref 136–145)

## 2013-05-27 LAB — TSH: Thyroid Stimulating Horm: 0.223 u[IU]/mL — ABNORMAL LOW

## 2013-05-27 LAB — T4, FREE: FREE THYROXINE: 1.25 ng/dL (ref 0.76–1.46)

## 2013-05-31 ENCOUNTER — Ambulatory Visit: Payer: Medicare Other | Admitting: Family Medicine

## 2013-05-31 ENCOUNTER — Encounter: Payer: Self-pay | Admitting: Internal Medicine

## 2013-05-31 DIAGNOSIS — Z0289 Encounter for other administrative examinations: Secondary | ICD-10-CM

## 2013-05-31 LAB — PLATELET COUNT: Platelet: 216 10*3/uL (ref 150–440)

## 2013-06-26 DIAGNOSIS — R002 Palpitations: Secondary | ICD-10-CM | POA: Insufficient documentation

## 2013-06-26 DIAGNOSIS — I493 Ventricular premature depolarization: Secondary | ICD-10-CM | POA: Insufficient documentation

## 2013-07-04 ENCOUNTER — Encounter: Payer: Medicare Other | Admitting: Family Medicine

## 2013-07-05 ENCOUNTER — Telehealth: Payer: Self-pay

## 2013-07-05 NOTE — Telephone Encounter (Signed)
Noted, thanks!

## 2013-07-05 NOTE — Telephone Encounter (Signed)
Mckenzie Mclaughlin OT with [facility]; Mckenzie Mclaughlin went to see pt but pt has refused OT services.

## 2013-07-05 NOTE — Telephone Encounter (Signed)
Esther notified as instructed by telephone.

## 2013-07-07 ENCOUNTER — Encounter: Payer: Self-pay | Admitting: Family Medicine

## 2013-07-07 ENCOUNTER — Ambulatory Visit (INDEPENDENT_AMBULATORY_CARE_PROVIDER_SITE_OTHER): Payer: Medicare Other | Admitting: Family Medicine

## 2013-07-07 VITALS — BP 144/84 | HR 92 | Temp 97.6°F | Ht 67.0 in | Wt 158.5 lb

## 2013-07-07 DIAGNOSIS — F329 Major depressive disorder, single episode, unspecified: Secondary | ICD-10-CM

## 2013-07-07 DIAGNOSIS — E7800 Pure hypercholesterolemia, unspecified: Secondary | ICD-10-CM

## 2013-07-07 DIAGNOSIS — E785 Hyperlipidemia, unspecified: Secondary | ICD-10-CM

## 2013-07-07 DIAGNOSIS — J449 Chronic obstructive pulmonary disease, unspecified: Secondary | ICD-10-CM

## 2013-07-07 DIAGNOSIS — R911 Solitary pulmonary nodule: Secondary | ICD-10-CM

## 2013-07-07 DIAGNOSIS — I10 Essential (primary) hypertension: Secondary | ICD-10-CM

## 2013-07-07 DIAGNOSIS — F341 Dysthymic disorder: Secondary | ICD-10-CM

## 2013-07-07 DIAGNOSIS — Z0000 Encounter for general adult medical examination without abnormal findings: Secondary | ICD-10-CM

## 2013-07-07 DIAGNOSIS — F419 Anxiety disorder, unspecified: Secondary | ICD-10-CM

## 2013-07-07 DIAGNOSIS — Z13820 Encounter for screening for osteoporosis: Secondary | ICD-10-CM

## 2013-07-07 MED ORDER — METOPROLOL SUCCINATE ER 50 MG PO TB24: 50.0000 mg | ORAL_TABLET | Freq: Every day | ORAL | Status: DC

## 2013-07-07 MED ORDER — ALPRAZOLAM 0.5 MG PO TABS: 0.5000 mg | ORAL_TABLET | Freq: Three times a day (TID) | ORAL | Status: DC | PRN

## 2013-07-07 NOTE — Progress Notes (Signed)
Pre visit review using our clinic review tool, if applicable. No additional management support is needed unless otherwise documented below in the visit note.  I have personally reviewed the Medicare Annual Wellness questionnaire and have noted 1. The patient's medical and social history 2. Their use of alcohol, tobacco or illicit drugs 3. Their current medications and supplements 4. The patient's functional ability including ADL's, fall risks, home safety risks and hearing or visual             impairment. 5. Diet and physical activities 6. Evidence for depression or mood disorders  The patients weight, height, BMI have been recorded in the chart and visual acuity is per eye clinic.  I have made referrals, counseling and provided education to the patient based review of the above and I have provided the pt with a written personalized care plan for preventive services.  See scanned forms.  Routine anticipatory guidance given to patient.  See health maintenance. Flu 2014 Shingles d/w pt.  PNA prev done Tetanus 2007 Colonoscopy done 2009 Breast cancer screening NA Advance directive- husband designated if patient were incapacitated.  Cognitive function addressed- see scanned forms- and if abnormal then additional documentation follows.  DXA declined by patient.   Elevated Cholesterol: Using medications without problems:yes Muscle aches: no Diet compliance:yes Exercise:some Will return for labs.   Hypertension:    Using medication without problems or lightheadedness: yes Chest pain with exertion:no Edema:occ, trace amount Short of breath: less SOB now, off O2.   Anxiety- xanax helps, taken BID prn and then at night for sleep.  D/w pt.  Tid dosing is reasonable.    PMH and SH reviewed  Meds, vitals, and allergies reviewed.   ROS: See HPI.  Otherwise negative.    GEN: nad, alert and oriented HEENT: mucous membranes moist NECK: supple w/o LA CV: rrr. PULM: ctab, no inc  wob ABD: soft, +bs EXT: trace BLE edema SKIN: no acute rash Chest wall noted R breast implant absent

## 2013-07-07 NOTE — Patient Instructions (Addendum)
Check with your insurance to see if they will cover the shingles shot. Try taking the xanax (0.5mg ) 3 times a day as needed.  Take care.  Glad to see you.

## 2013-07-09 DIAGNOSIS — Z0000 Encounter for general adult medical examination without abnormal findings: Secondary | ICD-10-CM | POA: Insufficient documentation

## 2013-07-09 NOTE — Assessment & Plan Note (Signed)
Per pulmonary, off O2 now.

## 2013-07-09 NOTE — Assessment & Plan Note (Signed)
See scanned forms.  Routine anticipatory guidance given to patient.  See health maintenance. Flu 2014 Shingles d/w pt.  PNA prev done Tetanus 2007 Colonoscopy done 2009 Breast cancer screening NA Advance directive- husband designated if patient were incapacitated.  Cognitive function addressed- see scanned forms- and if abnormal then additional documentation follows.  DXA declined by patient.

## 2013-07-09 NOTE — Assessment & Plan Note (Signed)
Continue current meds.  See dose changes/simplification on xanax.  She agrees with plan.  Sig upheaval with recent illness but appears to be improving.  Okay for outpatient f/u.

## 2013-07-09 NOTE — Assessment & Plan Note (Signed)
Reasonable control, continue as is.  

## 2013-07-09 NOTE — Assessment & Plan Note (Signed)
Continue statin, return for labs.  She agrees.

## 2013-07-09 NOTE — Assessment & Plan Note (Signed)
Declined, d/w pt.

## 2013-07-11 ENCOUNTER — Encounter: Payer: Self-pay | Admitting: *Deleted

## 2013-07-11 ENCOUNTER — Other Ambulatory Visit (INDEPENDENT_AMBULATORY_CARE_PROVIDER_SITE_OTHER): Payer: Medicare Other

## 2013-07-11 DIAGNOSIS — E7800 Pure hypercholesterolemia, unspecified: Secondary | ICD-10-CM

## 2013-07-11 LAB — LIPID PANEL
CHOLESTEROL: 136 mg/dL (ref 0–200)
HDL: 52.7 mg/dL (ref >39.00)
LDL Cholesterol: 64 mg/dL (ref 0–99)
Total CHOL/HDL Ratio: 3
Triglycerides: 97 mg/dL (ref 0.0–149.0)
VLDL: 19.4 mg/dL (ref 0.0–40.0)

## 2013-07-12 ENCOUNTER — Encounter: Payer: Self-pay | Admitting: *Deleted

## 2013-07-22 IMAGING — CR DG CHEST 2V
1 series · 3 of 3 positions shown · non-contrast
Comparison: none

REASON FOR EXAM: sob
COMMENTS:

[Series 1: x chest ap · 0.14mm/px · 3 of 3 slices shown]
[im 1/3]
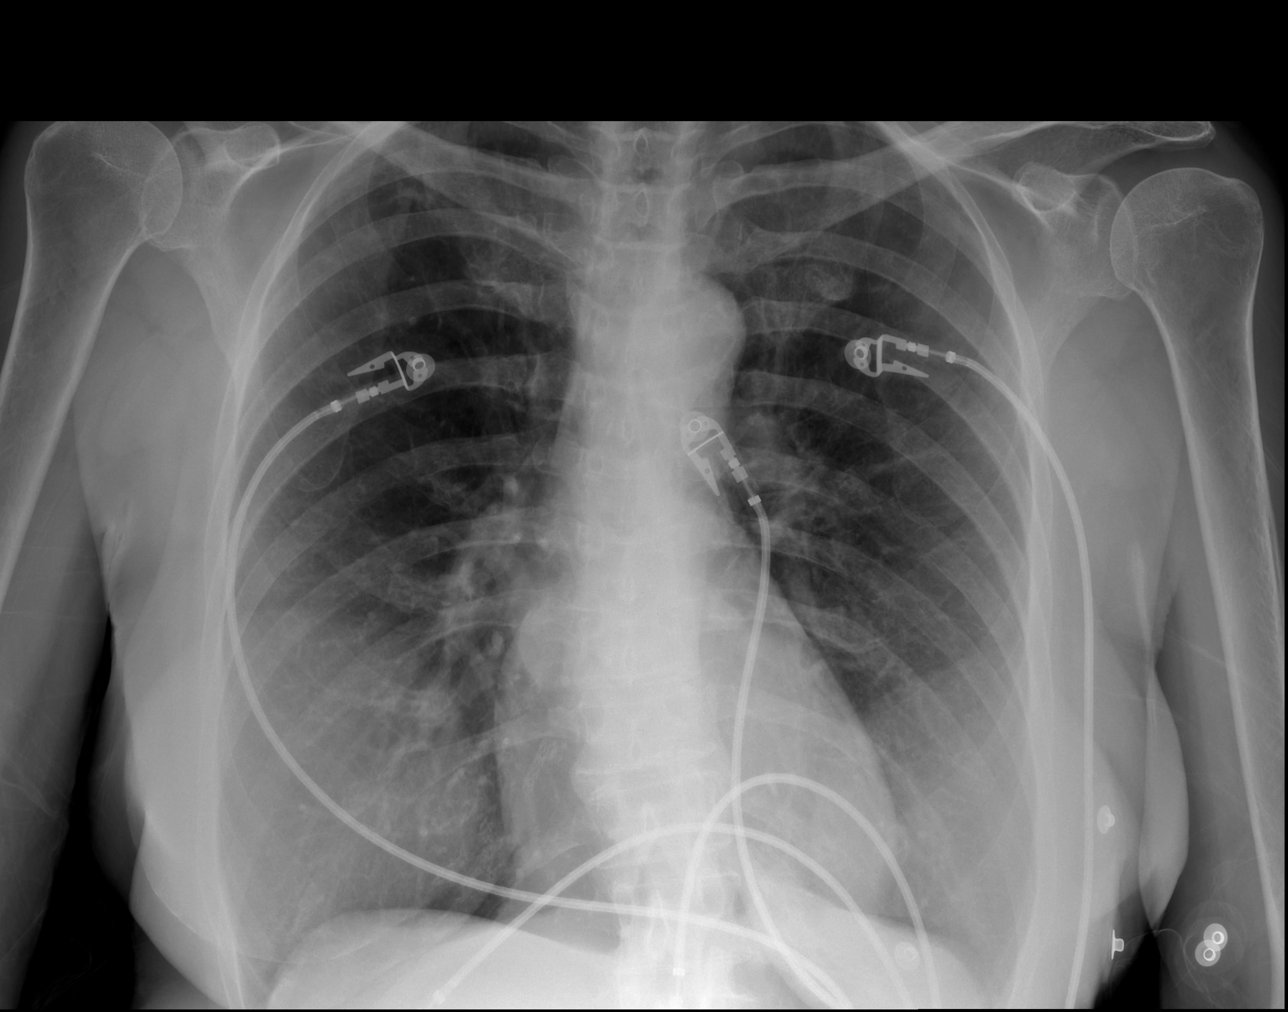
[im 2/3]
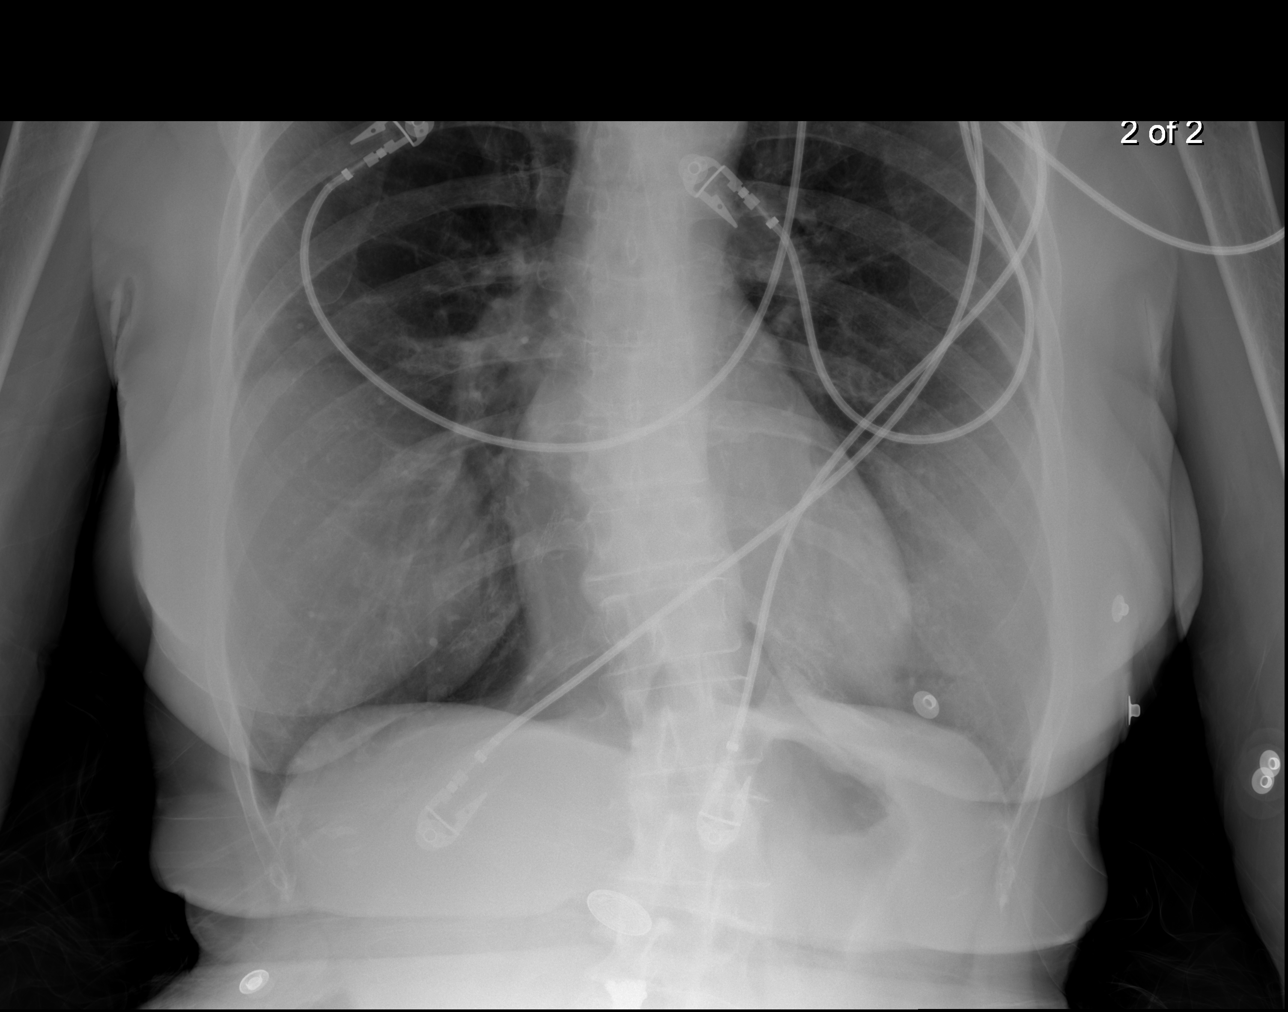
[im 3/3]
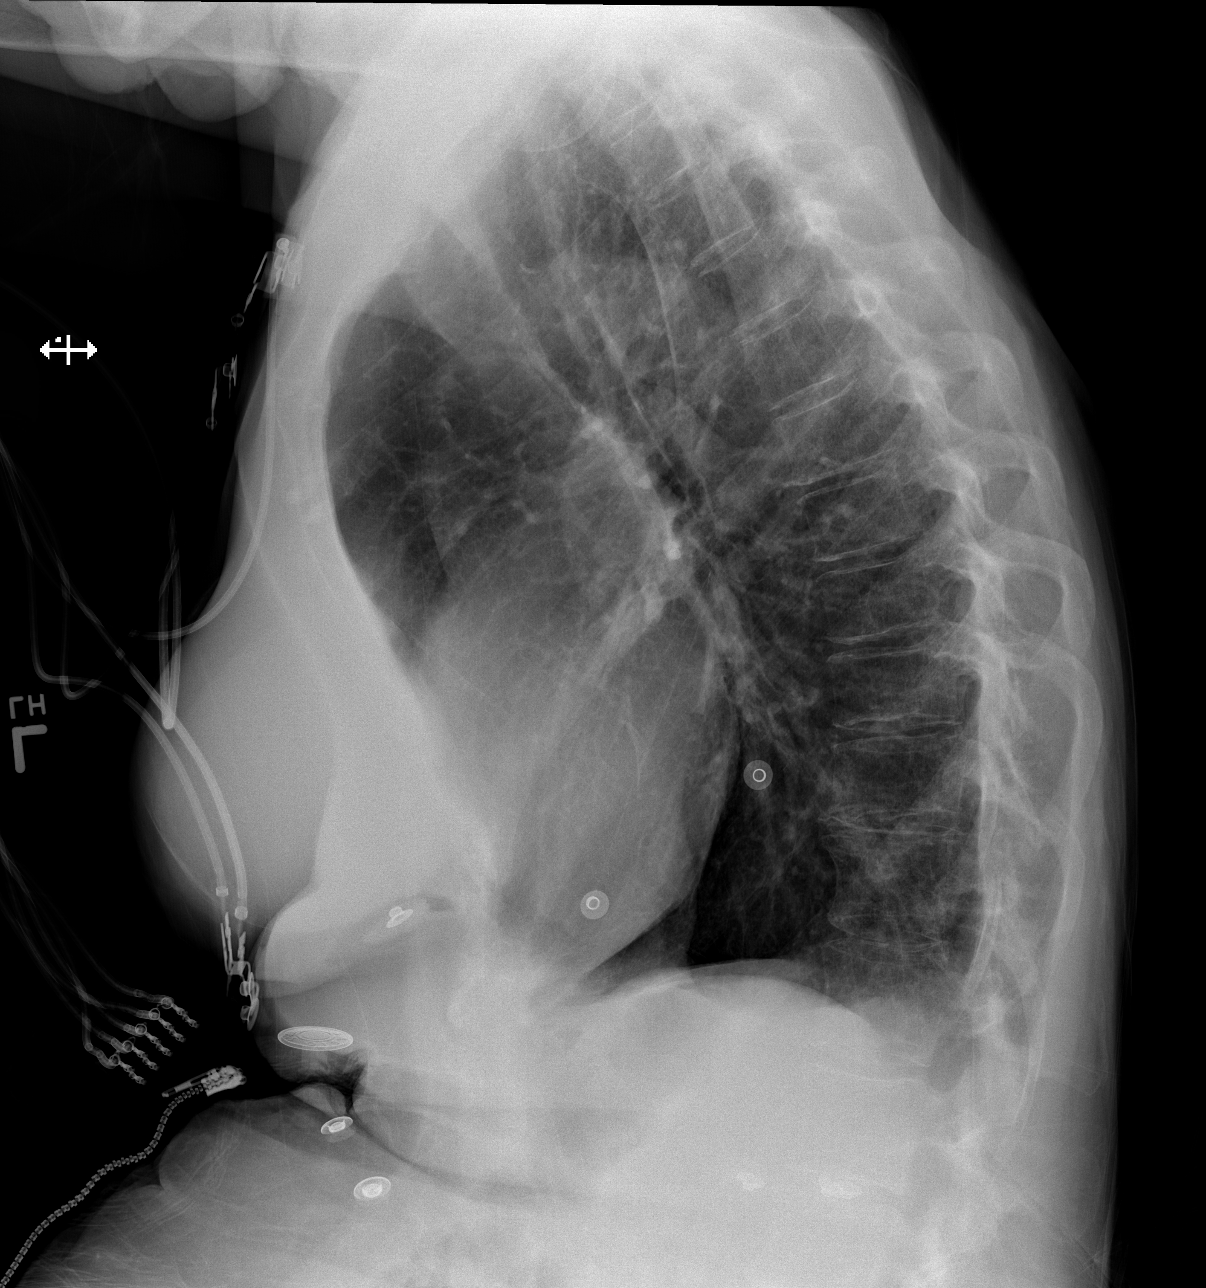

[3 of 3 positions shown; findings below may reference images not displayed]

PROCEDURE:     DXR - DXR CHEST PA (OR AP) AND LATERAL  - April 10, 2012 [DATE]

RESULT:     Comparison is made to the study April 20, 2011.

The lungs are hyperinflated. There is no focal infiltrate. Density over the
lower lobes is consistent with breast implants. The cardiac silhouette is
normal in size. The pulmonary vascularity is not engorged. There is no
significant pleural fluid collection. The mediastinum is normal in width.
IMPRESSION: There is hyperinflation consistent with COPD. There is no
evidence of pneumonia. I cannot exclude acute bronchitis in the appropriate
clinical setting.

[REDACTED]

## 2013-07-24 ENCOUNTER — Emergency Department: Payer: Self-pay | Admitting: Emergency Medicine

## 2013-07-24 ENCOUNTER — Telehealth: Payer: Self-pay | Admitting: Family Medicine

## 2013-07-24 LAB — COMPREHENSIVE METABOLIC PANEL
Albumin: 3.4 g/dL (ref 3.4–5.0)
Alkaline Phosphatase: 91 U/L
Anion Gap: 3 — ABNORMAL LOW (ref 7–16)
BUN: 11 mg/dL (ref 7–18)
Bilirubin,Total: 0.3 mg/dL (ref 0.2–1.0)
CHLORIDE: 103 mmol/L (ref 98–107)
Calcium, Total: 9.9 mg/dL (ref 8.5–10.1)
Co2: 33 mmol/L — ABNORMAL HIGH (ref 21–32)
Creatinine: 0.57 mg/dL — ABNORMAL LOW (ref 0.60–1.30)
EGFR (African American): >60
EGFR (Non-African Amer.): >60
Glucose: 109 mg/dL — ABNORMAL HIGH (ref 65–99)
Osmolality: 278 (ref 275–301)
Potassium: 4.3 mmol/L (ref 3.5–5.1)
SGOT(AST): 22 U/L (ref 15–37)
SGPT (ALT): 22 U/L (ref 12–78)
Sodium: 139 mmol/L (ref 136–145)
Total Protein: 7.5 g/dL (ref 6.4–8.2)

## 2013-07-24 LAB — CBC
HCT: 40.5 % (ref 35.0–47.0)
HGB: 13.3 g/dL (ref 12.0–16.0)
MCH: 30 pg (ref 26.0–34.0)
MCHC: 32.8 g/dL (ref 32.0–36.0)
MCV: 92 fL (ref 80–100)
PLATELETS: 256 10*3/uL (ref 150–440)
RBC: 4.42 10*6/uL (ref 3.80–5.20)
RDW: 13.7 % (ref 11.5–14.5)
WBC: 8.8 10*3/uL (ref 3.6–11.0)

## 2013-07-24 LAB — PRO B NATRIURETIC PEPTIDE: B-Type Natriuretic Peptide: 434 pg/mL — ABNORMAL HIGH (ref 0–125)

## 2013-07-24 LAB — TROPONIN I: Troponin-I: <0.02 ng/mL

## 2013-07-24 IMAGING — PT NM PET TUM IMG INITIAL (PI) SKULL BASE T - THIGH
2 series · 25 of 25 positions shown · non-contrast
Comparison: none

REASON FOR EXAM: pulmonary consult
COMMENTS:

PROCEDURE:     PET - PET/CT INIT STAGING LUNG CA  - April 12, 2012 [DATE]
RESULT:     Comparison: CT of the chest 04/10/2012
Radiopharmaceutical: 13.17 mCi F18-FDG, intravenously.
TECHNIQUE: Imaging was performed from the skull base to the mid-thigh using
routine PET/CT acquisition protocol.
Injection site: Left wrist
Time of FDG injection: 2221 hours
Serum glucose: 85 mg/dL
Time of imaging: 7177 hours through 3813 hours

[Series 3: ct wb 3.0 b30f · axial · 3.0mm · 0.98mm/px · z∈[+21,+889]mm · 15 of 435 slices shown]
[im 1/435]
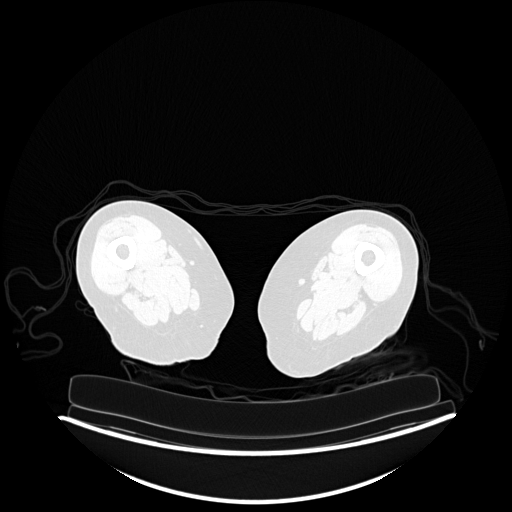
[im 32/435]
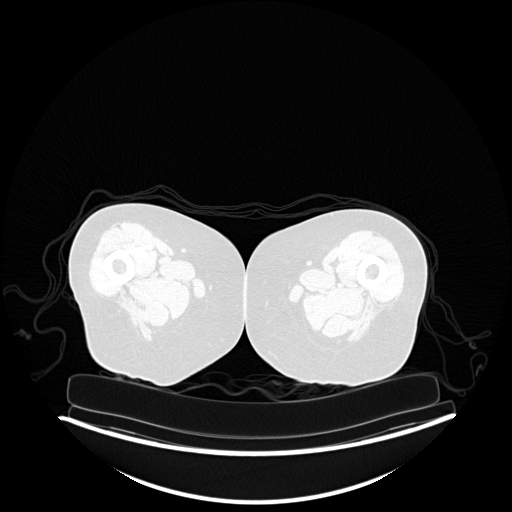
[im 63/435]
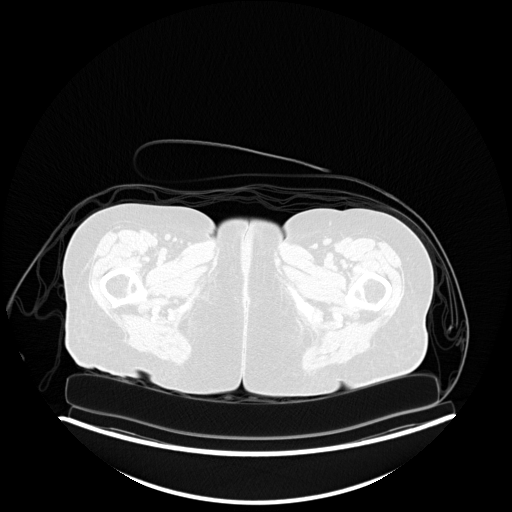
[im 94/435]
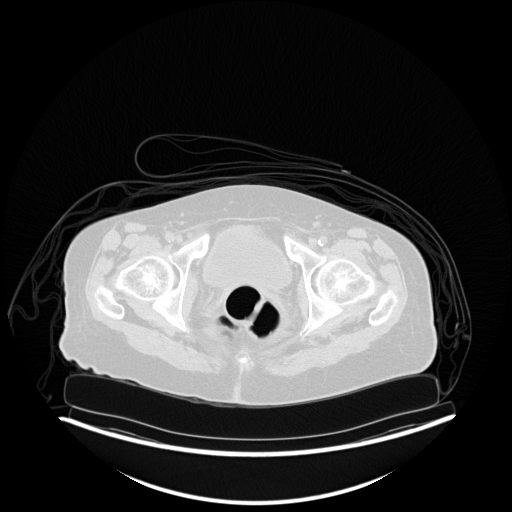
[im 125/435]
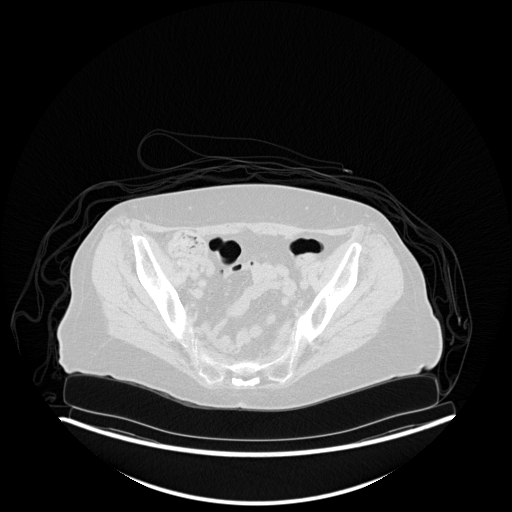
[im 156/435]
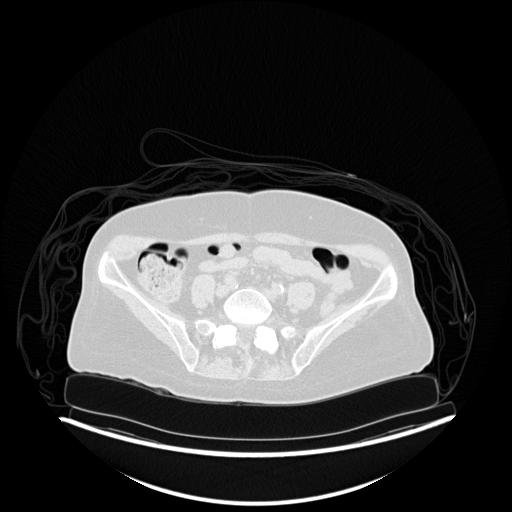
[im 187/435]
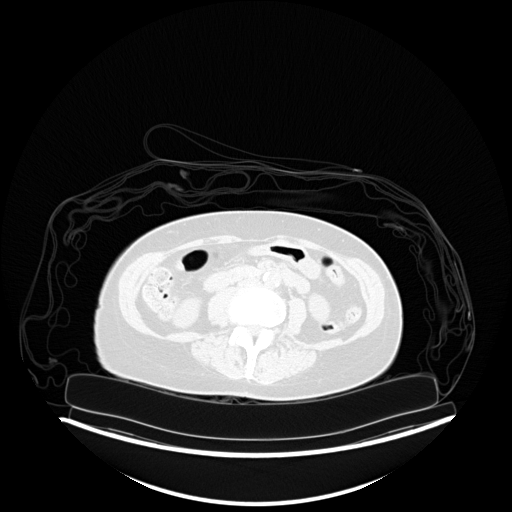
[im 218/435]
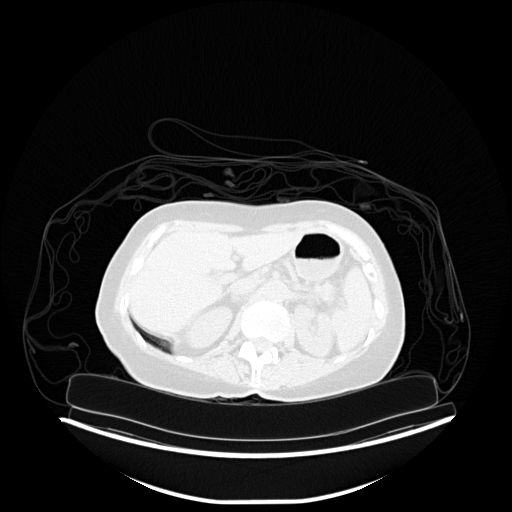
[im 249/435]
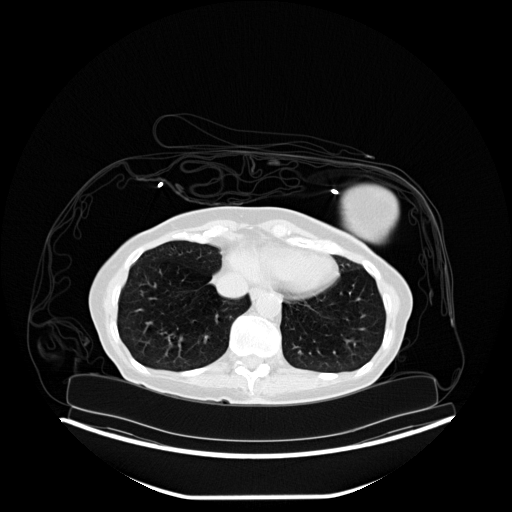
[im 280/435]
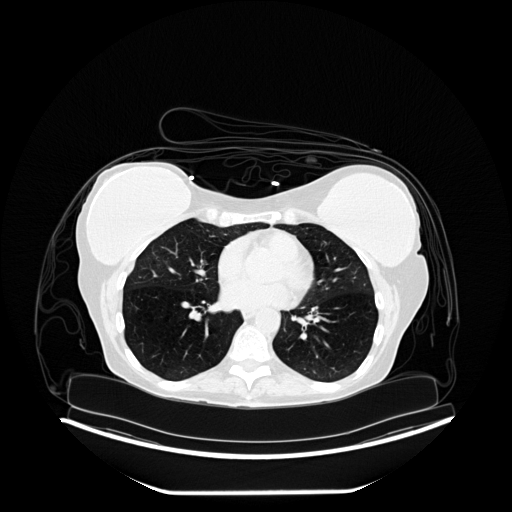
[im 311/435]
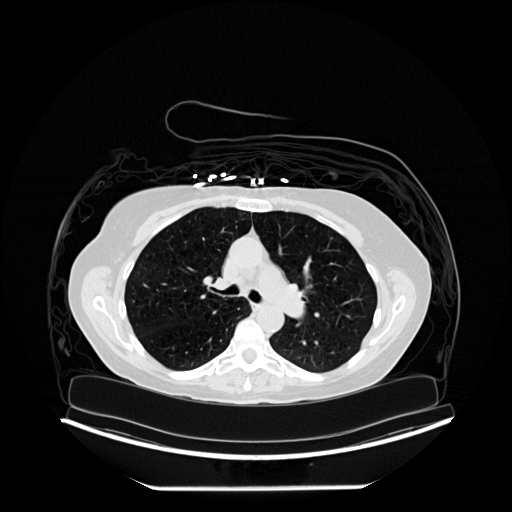
[im 342/435]
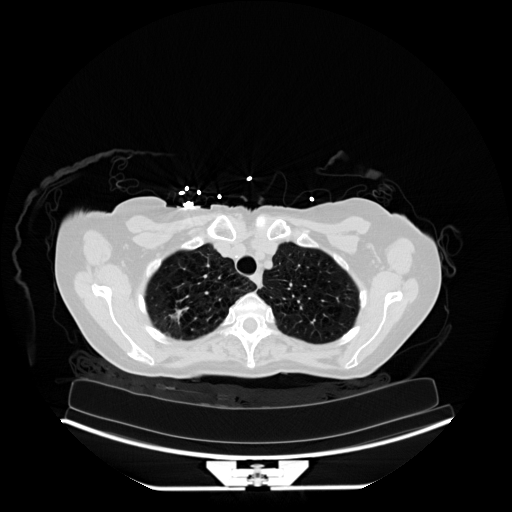
[im 373/435]
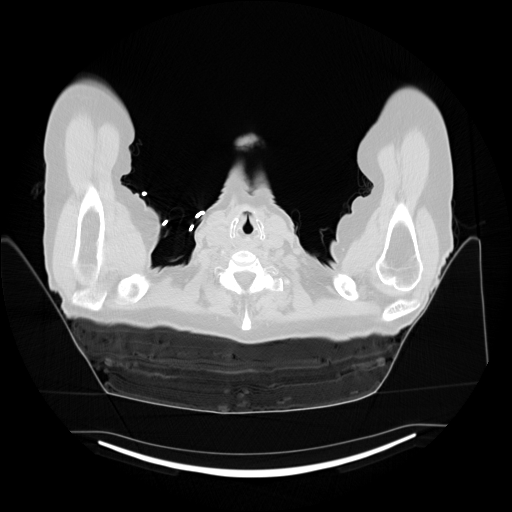
[im 404/435]
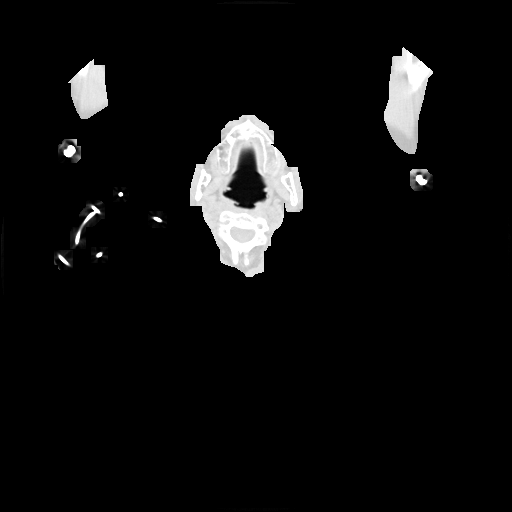
[im 435/435  brain]
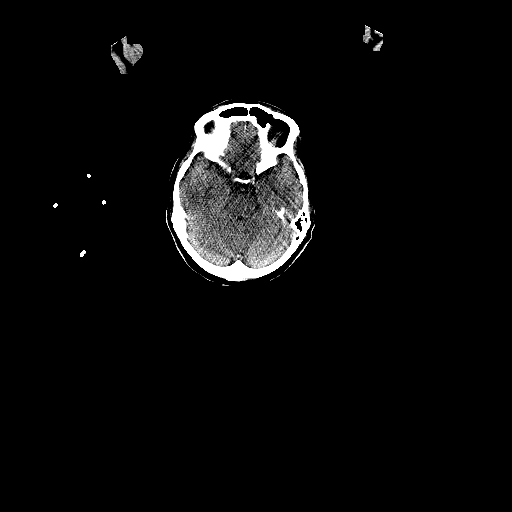

[Series 102: pet wb · axial · 5.0mm · 4.07mm/px · z∈[+22,+889]mm · 10 of 290 slices shown]
[im 1/290]
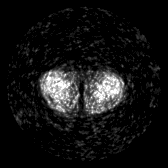
[im 33/290]
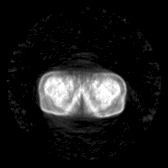
[im 65/290]
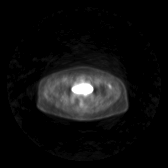
[im 97/290]
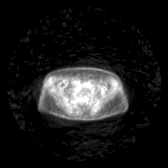
[im 129/290]
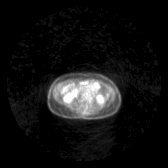
[im 161/290]
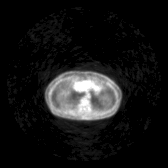
[im 193/290]
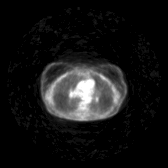
[im 225/290]
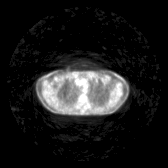
[im 257/290]
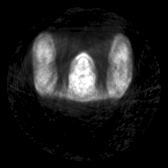
[im 290/290]
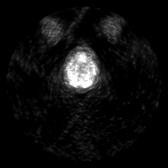

[25 of 25 positions shown; findings below may reference images not displayed]

FINDINGS: No hypermetabolic lymph nodes identified in the neck.

No hypermetabolic mediastinal, hilar, or axillary lymph nodes identified.
There is a spiculated hypermetabolic nodule in the right upper lobe which
measures SUV max 5.1. There is some cavitation along the inferior margin.
There is moderate centrilobular emphysema. There is a 3 mm nodule in the
anterior/superior right lower lobe, just posterior to the major fissure.
There are a few small calcified subpleural nodules in the right lung. There
is a 4 mm subpleural nodule in the right upper lobe.

No abnormal foci of hypermetabolic activity identified in the abdomen or
pelvis. There is physiologic radiotracer activity in the urinary system and
bowel. There is mild radiotracer activity along the lateral aspect of the
left hip joint without definite CT correlate, which is likely reactive.
There is a small focus of radiotracer activity along the anterior aspect of
the right acetabulum at the attachment of the rectus femoris, which is
likely reactive. There is a small focus of low-attenuation along the
falciform ligament which is too small to characterize. This likely
represents a cyst given its density on the prior contrast-enhanced chest CT.
The spleen, adrenals, and pancreas are unremarkable.
IMPRESSION: 1. Hypermetabolic spiculated nodule in the right upper lobe. Malignancy
would be of differential concern.
2. No evidence of FDG metastatic disease in the neck, chest, abdomen, or
pelvis.

## 2013-07-24 NOTE — Telephone Encounter (Signed)
Given the dizziness, nausea and dyspnea, agree with ER eval.

## 2013-07-24 NOTE — Telephone Encounter (Signed)
Patient Information:  Caller Name: [place]  Phone: [phone]  Patient: Mckenzie, Mclaughlin  Gender: Female  DOB: 11-16-1940  Age: 73 Years  PCP: Elsie Stain Brigitte Pulse) [facility])  Office Follow Up:  Does the office need to follow up with this patient?: No  Instructions For The Office: N/A  RN Note:  Patient states she developed pain in her left arm from elbow, radiating up left arm and into the left shoulder blade and left side of neck. Onset 07/24/13 1000. States pain is constant. States pain does not increase with movement or palpation. Denies chest, back or jaw pain. Patient states pain is accompanied by dizziness, nausea and dyspnea. Denies diaphoresis. Care advice given per guidelines. Patient advised to call 911 immediately. Patient states she is not alone. Patient advised to chew 4 Aspirin 81mg . after calling 911. Patient verbalizes understanding and agreeable.  Symptoms  Reason For Call & Symptoms: Left arm and neck pain  Reviewed Health History In EMR: Yes  Reviewed Medications In EMR: Yes  Reviewed Allergies In EMR: Yes  Reviewed Surgeries / Procedures: Yes  Date of Onset of Symptoms: 07/24/2013  Guideline(s) Used:  Arm Pain  Disposition Per Guideline:   Call EMS 911 Now  Reason For Disposition Reached:   Sounds like a life-threatening emergency to the triager  Advice Given:  N/A  Patient Will Follow Care Advice:  YES

## 2013-08-10 IMAGING — CR DG CHEST 1V
1 series · 2 of 2 positions shown · non-contrast
Comparison: Imaging during CT-guided biopsy earlier today.

CLINICAL DATA: Trace right pneumothorax after biopsy of a right
upper lobe lung nodule earlier today.

CHEST - 1 VIEW

[Series 1: AP · U · 2 of 2 slices shown]
[im 1/2]
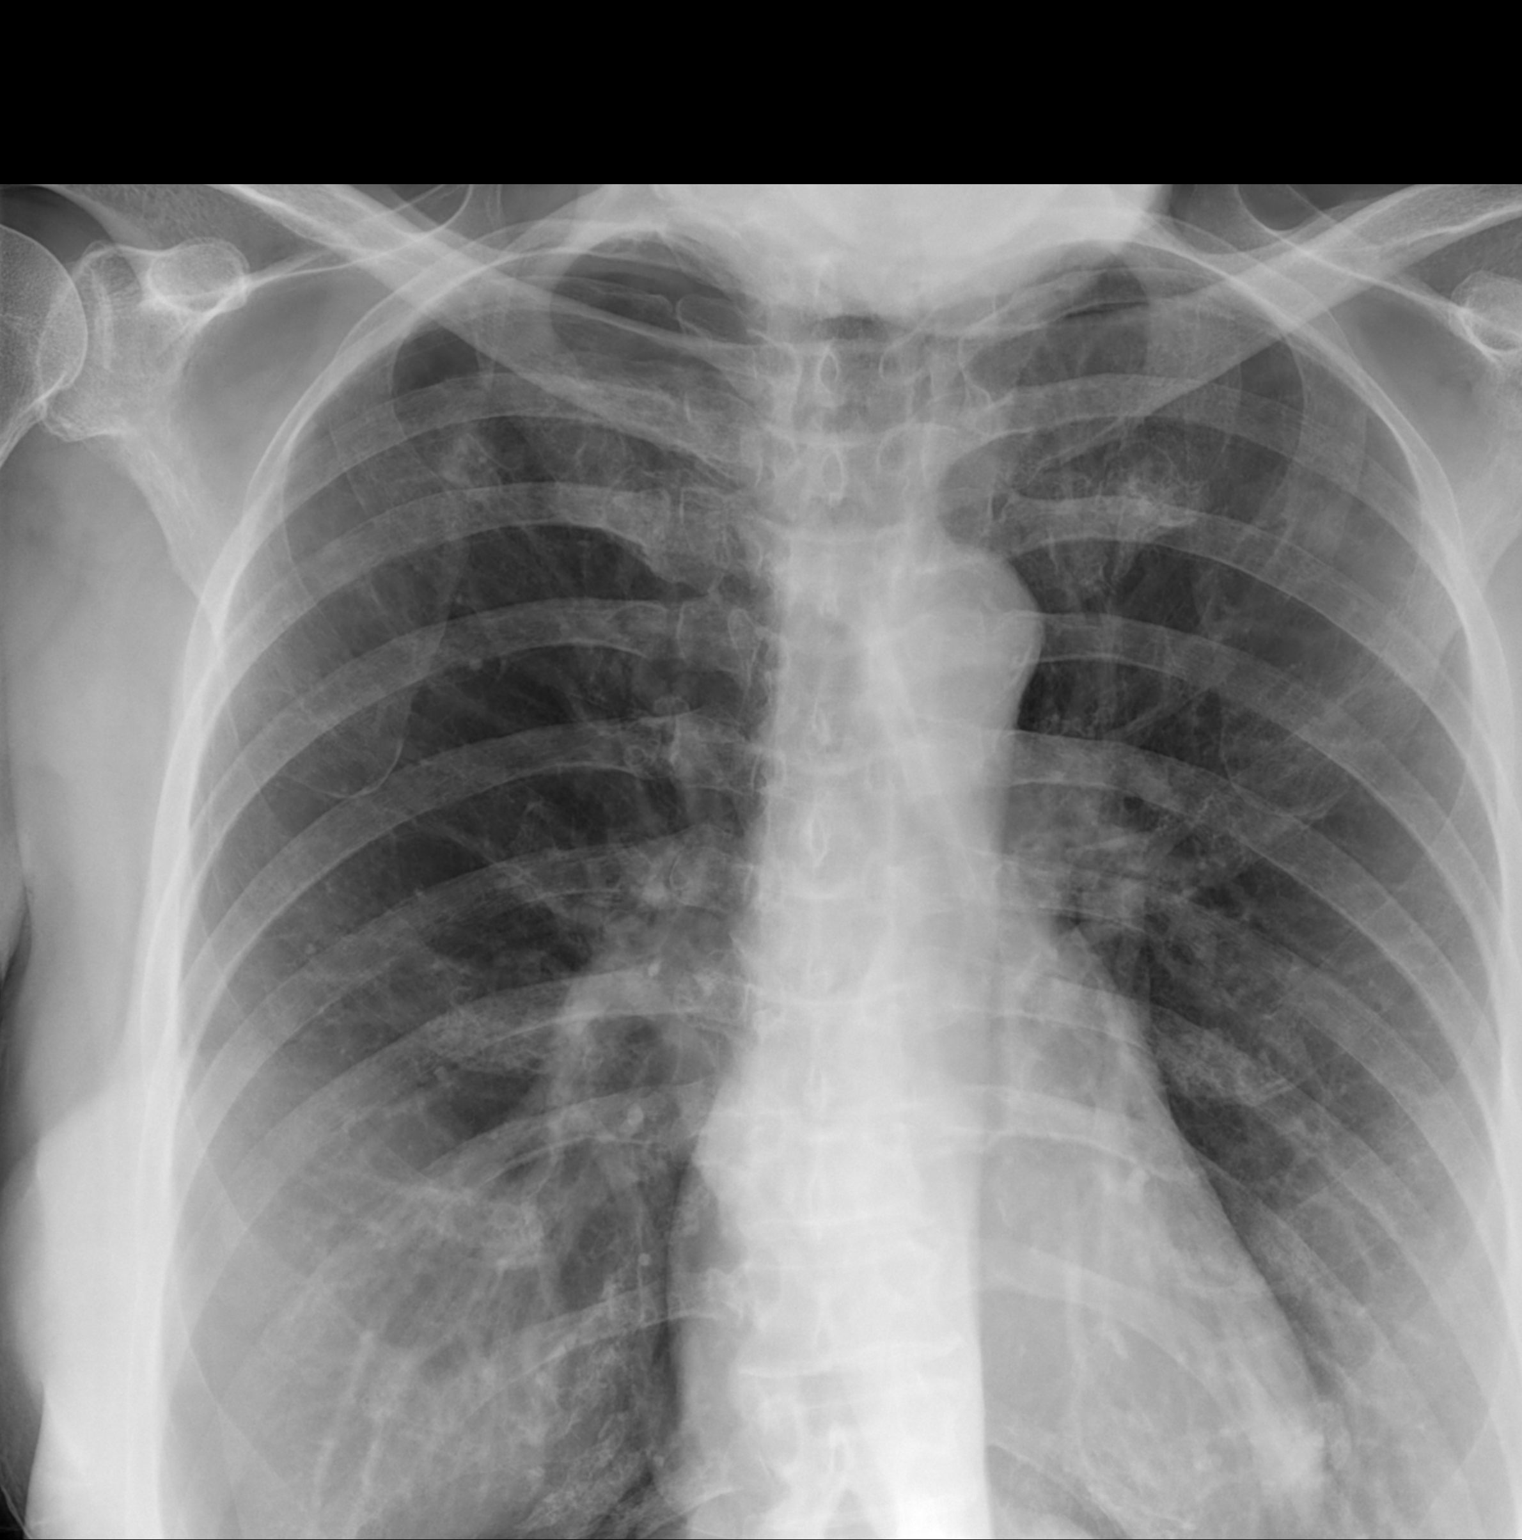
[im 2/2]
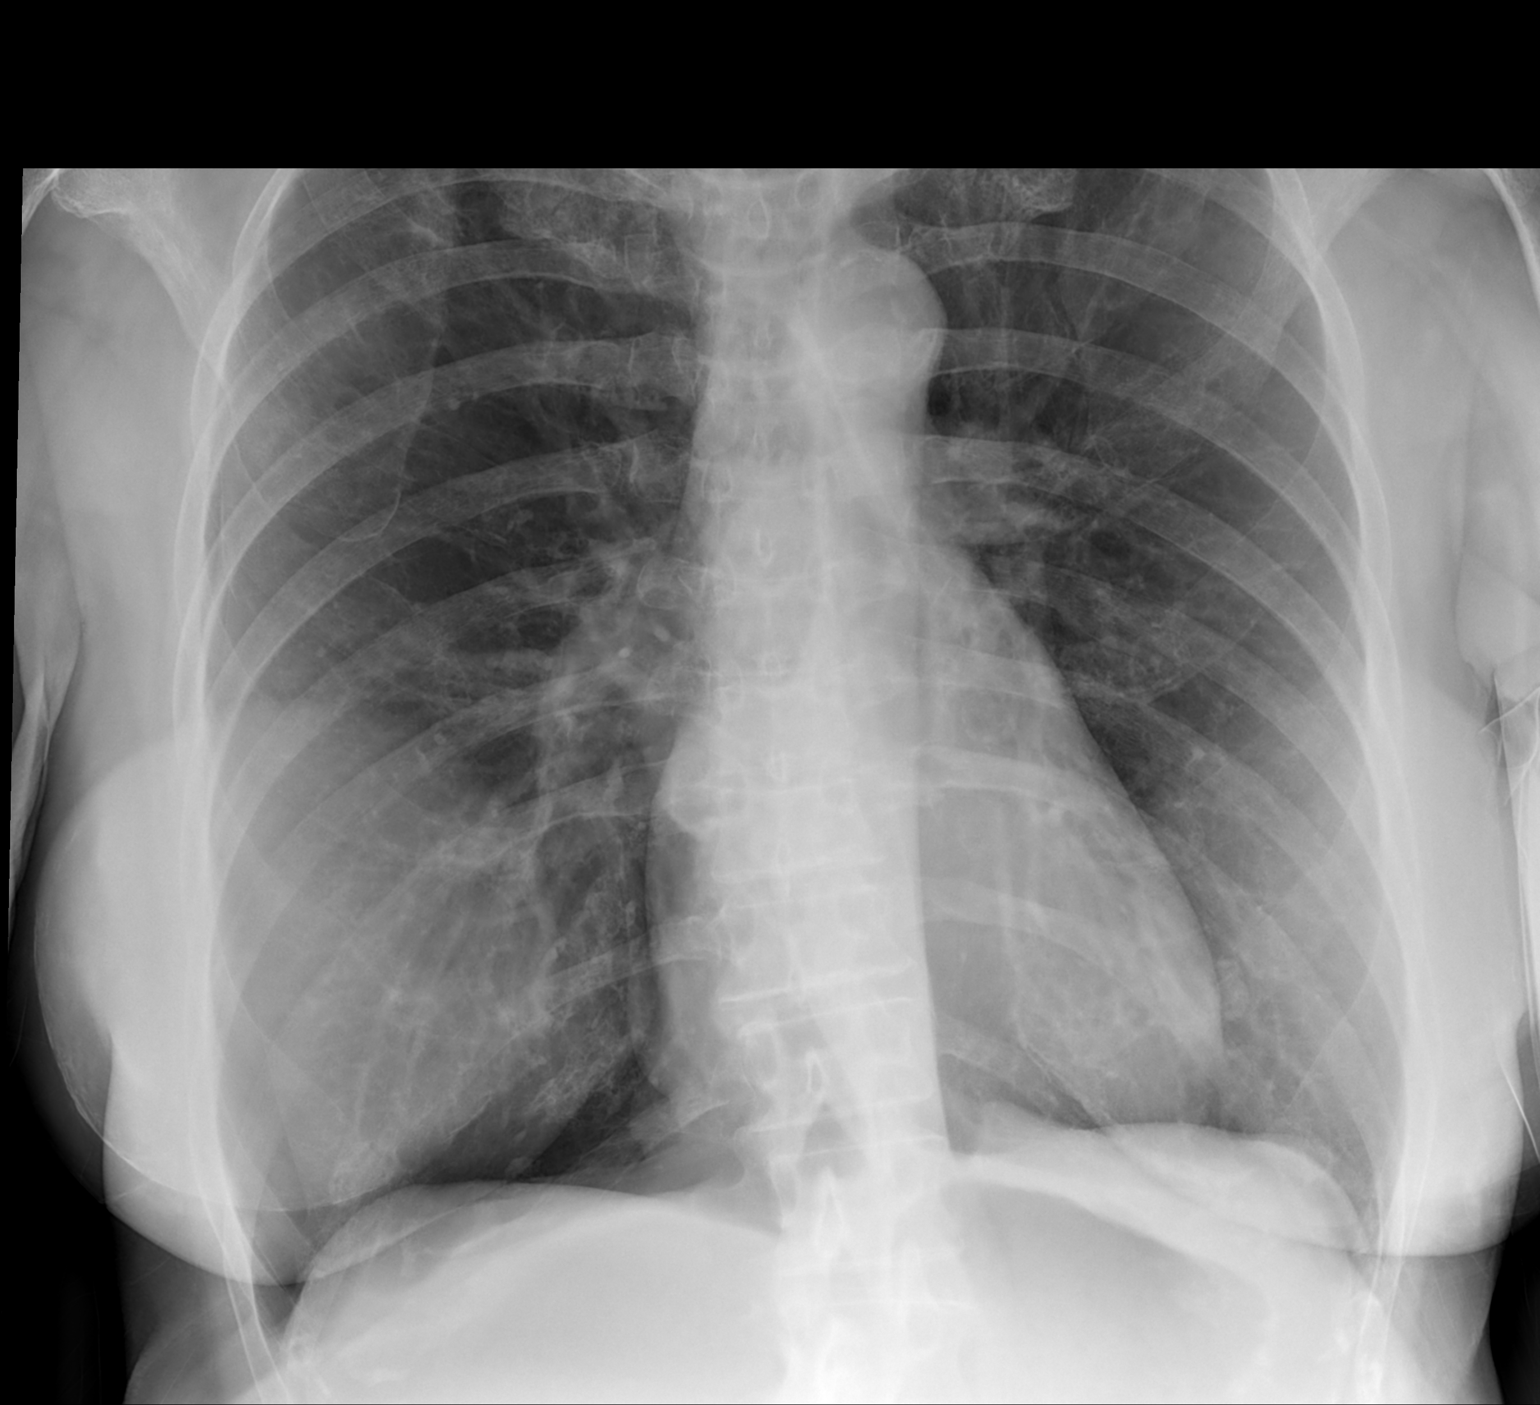

[2 of 2 positions shown; findings below may reference images not displayed]

FINDINGS: There is no evidence of significant right-sided
pneumothorax.  There may be a trace amount of air in the lateral
pleural space.  Small right upper lobe pulmonary nodule visible.
Underlying COPD present.  No pleural effusions.
IMPRESSION: No significant pneumothorax after lung biopsy.  Potential trace
amount of air is identified by chest x-ray in the lateral pleural
space.

## 2013-08-10 IMAGING — CT CT BIOPSY
1 of 13 series · 3 of 32 positions shown, 7 images · non-contrast
Comparison: CT of the chest on 04/10/2012 and PET scan on
04/12/2012, both performed at [HOSPITAL].

CLINICAL DATA: History of COPD and development of a partially
cavitary right upper lobe pulmonary nodule.  The patient presents
for biopsy.

CT GUIDED CORE BIOPSY OF RIGHT LUNG NODULE
Sedation:   2.0 mg IV Versed; 100 mcg IV Fentanyl
Total Moderate Sedation Time: 20 minutes.

[Series 9: add scan 5.0 b70f · axial · 0.37mm/px · z∈[-111,-86]mm · 3 of 6 slices shown, 7 images]
[im 1/6  soft-tissue]
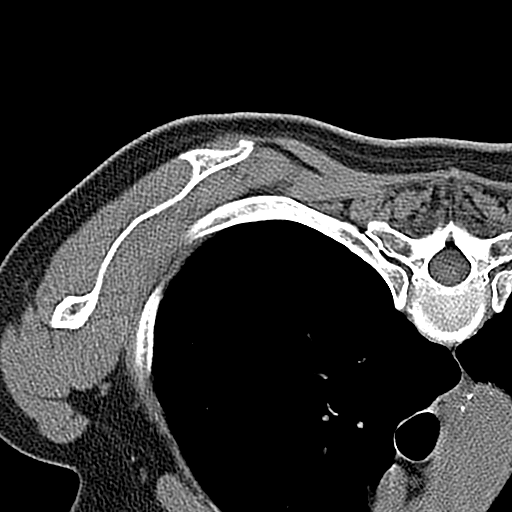
[im 1/6  lung]
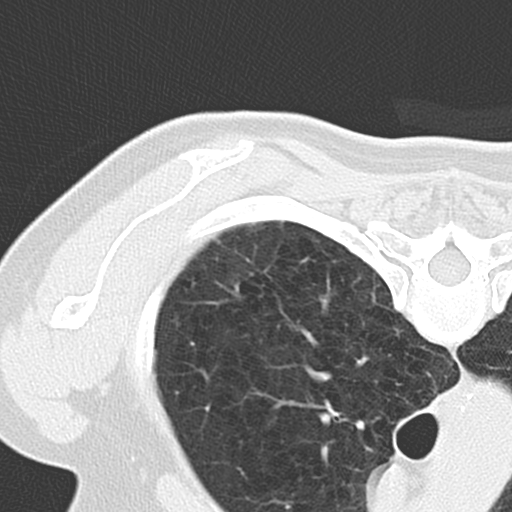
[im 1/6  bone]
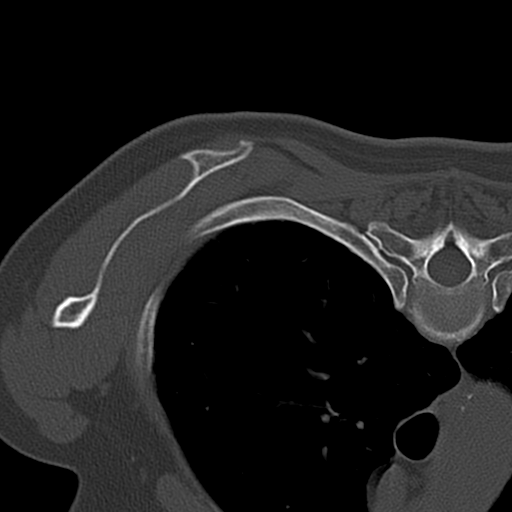
[im 3/6  soft-tissue]
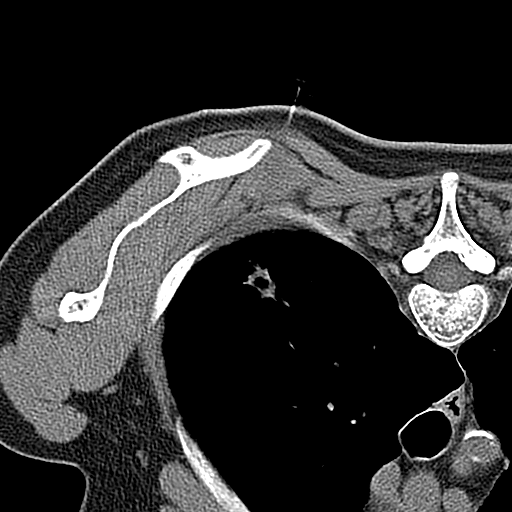
[im 3/6  lung]
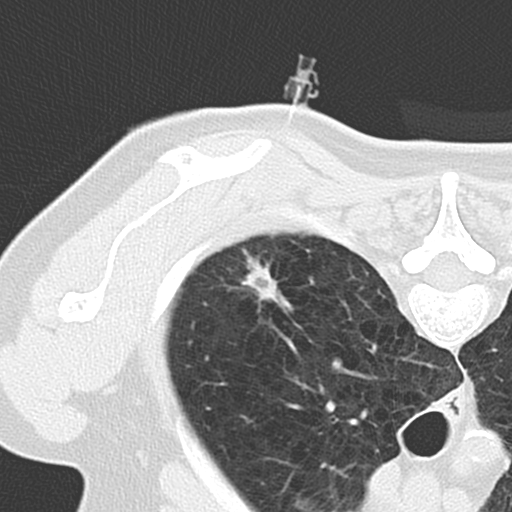
[im 6/6  soft-tissue]
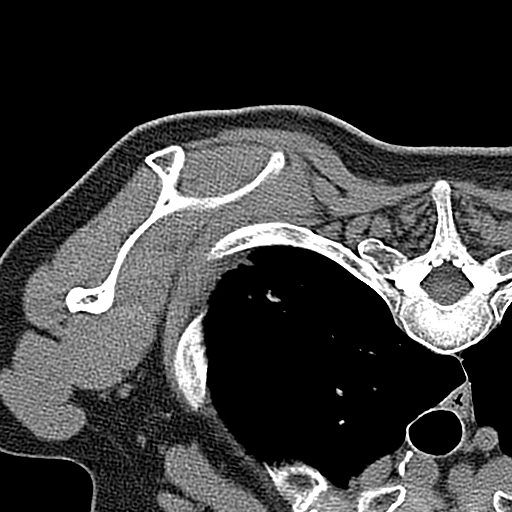
[im 6/6  lung]
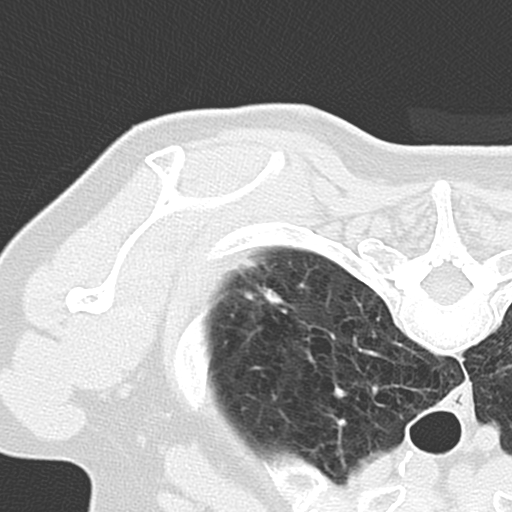

[3 of 32 positions shown; findings below may reference images not displayed]

Procedure:  The procedure risks, benefits, and alternatives were
explained to the patient.  Questions regarding the procedure were
encouraged and answered.  The patient understands and consents to
the procedure.

The right posterior chest wall was prepped with Betadine in a
sterile fashion, and a sterile drape was applied covering the
operative field.  A sterile gown and sterile gloves were used for
the procedure.  Local anesthesia was provided with 1% Lidocaine.

CT was performed in a prone position to localize a right upper lobe
pulmonary nodule.  Under CT guidance, a 17 gauge needle was
advanced to the posterior margin of the nodule.  A total of three
coaxial 18-gauge core biopsy samples were obtained.  Material was
submitted in formalin.  The outer needle was retracted an
additional CT performed.

Complications: Trace pneumothorax.
FINDINGS: Partially cavitated posterior right upper lobe nodule
measures approximately 0.9 x 1.4 cm.  Solid tissue and tissue
fragments were obtained with core biopsy of the lesion.  Postbiopsy
imaging shows a trace amount of pleural air posteriorly.  This will
be followed by chest x-ray during recovery.
IMPRESSION: CT guided core biopsy performed of the partially cavitated right
upper lobe nodule.  Solid core biopsy tissue was obtained.  The
procedure was complicated by a trace right posterior pneumothorax
which will be followed by chest x-ray during recovery.

## 2013-08-12 IMAGING — CR DG CHEST 1V PORT
1 series · 1 of 1 positions shown · non-contrast
Comparison: none

REASON FOR EXAM: post chest tube placement
COMMENTS:

PROCEDURE:     DXR - DXR PORTABLE CHEST SINGLE VIEW  - May 01, 2012  [DATE]
RESULT:     Comparison: 05/01/2012

[ap]
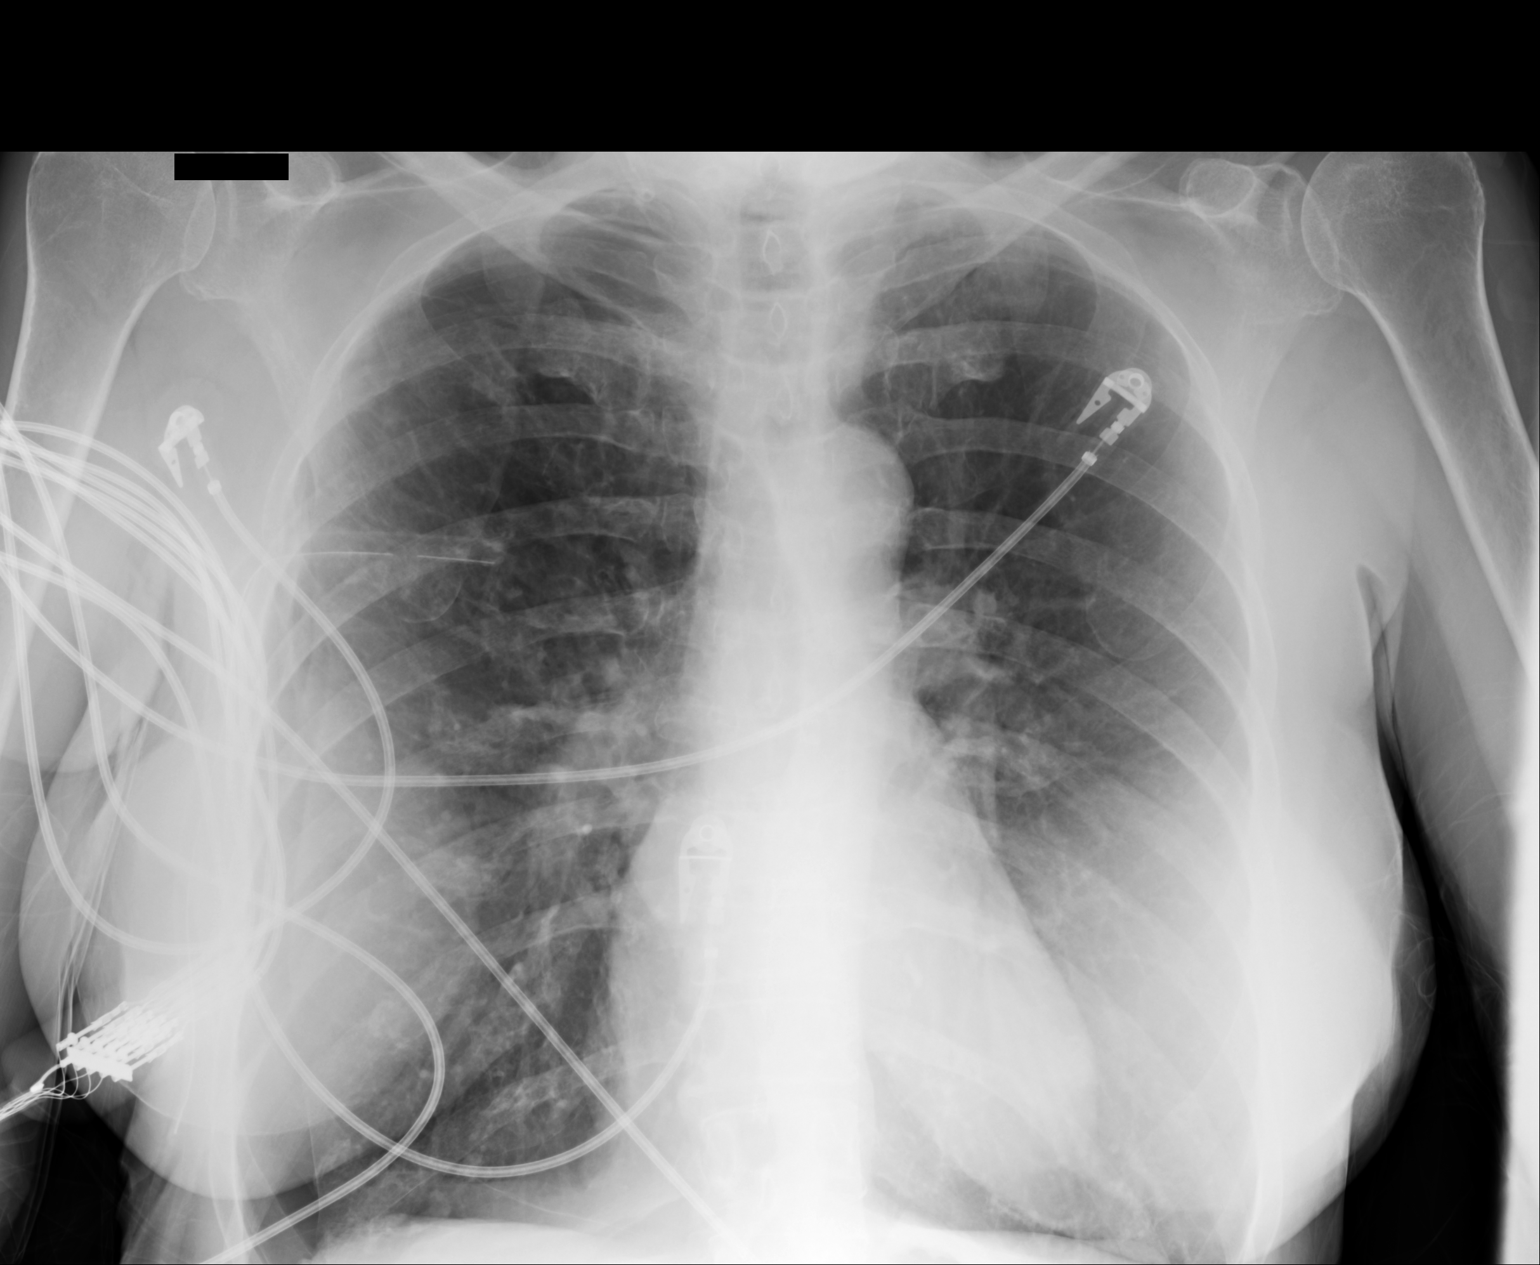

[1 of 1 positions shown; findings below may reference images not displayed]

FINDINGS: Single portable AP chest radiograph is provided. There is a right-sided
chest tube. There is a small pneumothorax measuring 19 mm in thickness along
the lateral chest wall. There is no focal parenchymal opacity, pleural
effusion, or pneumothorax. Normal cardiomediastinal silhouette. The osseous
structures are unremarkable.
IMPRESSION: Right-sided chest tube with a small persistent pneumothorax.

[REDACTED]

## 2013-08-12 IMAGING — CT CT NECK-CHEST W/ CM
1 series · 12 of 14 positions shown, 15 images · IV contrast (isovue)
Comparison: None

REASON FOR EXAM: RECENT RIGHT LUNG BX; RIGHT NECK & UPPER CHEST PAIN
COMMENTS:   May transport without cardiac monitor

PROCEDURE:     CT  - CT NECK AND CHEST W  - May 01, 2012  [DATE]
RESULT:     History: Chest pain. Recent right lung biopsy.
TECHNIQUE: Multiple axial images obtained from the base of the skull to the
dome of the liver, without p.o. contrast and with  75 ml of Isovue 370
intravenous contrast.

[Series 2: soft tissue · axial · 0.48mm/px · z∈[-276,-78]mm · 12 of 78 slices shown, 15 images]
[im 6/78  soft-tissue]
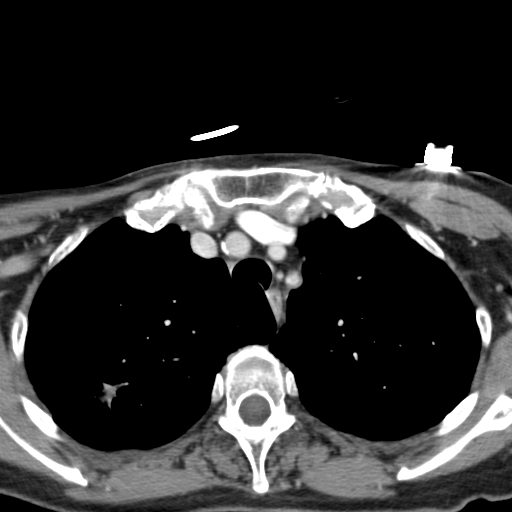
[im 6/78  bone]
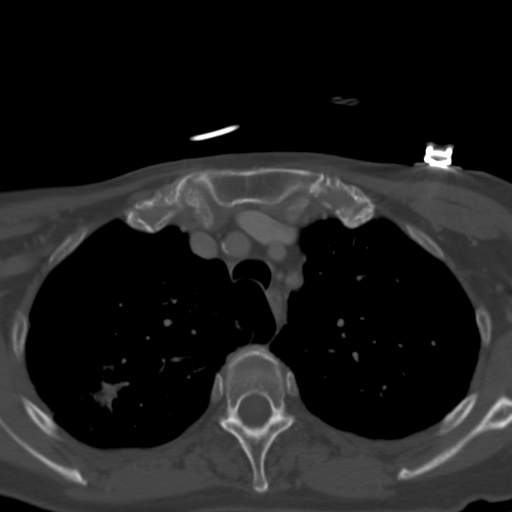
[im 12/78  bone]
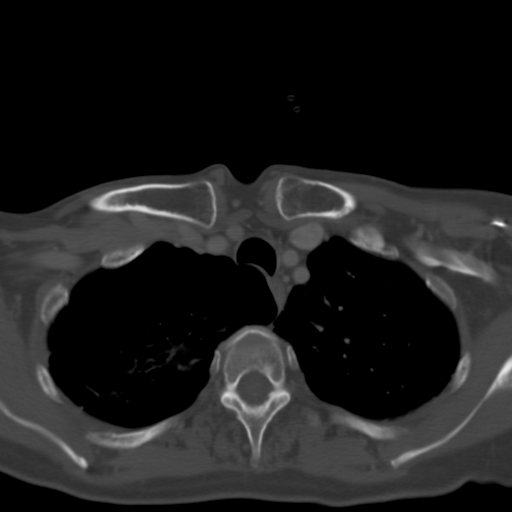
[im 18/78  bone]
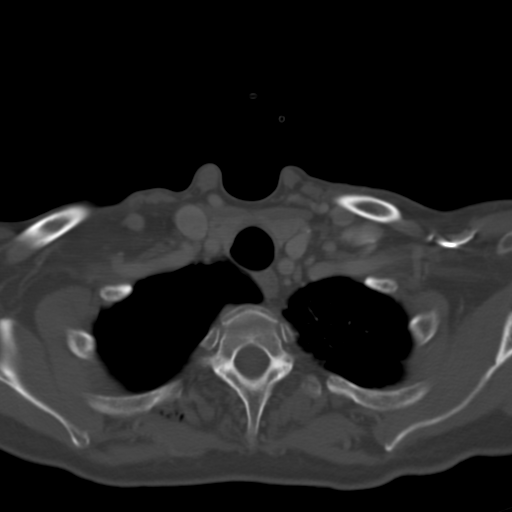
[im 24/78  bone]
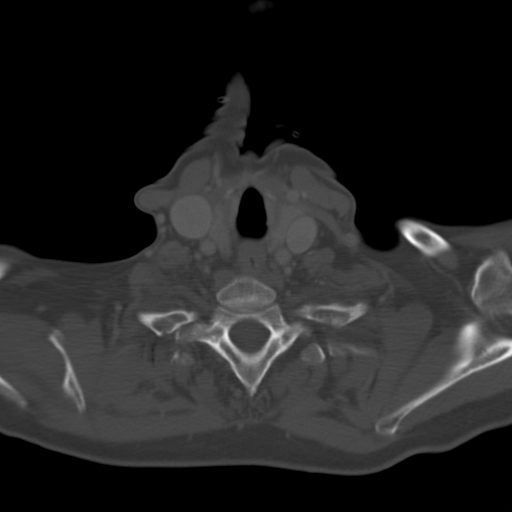
[im 30/78  soft-tissue]
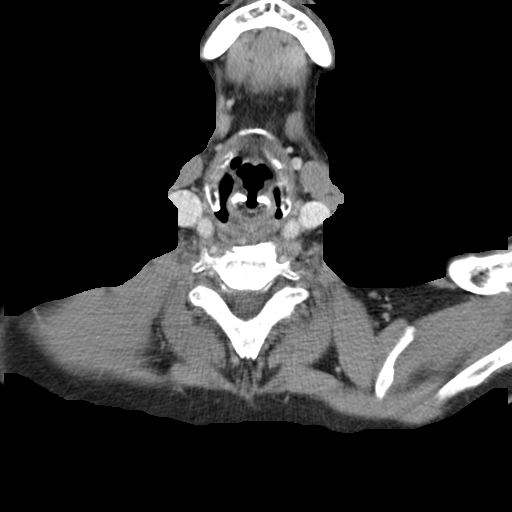
[im 30/78  bone]
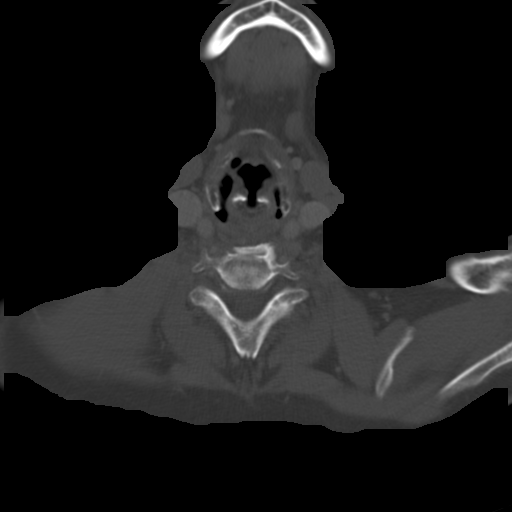
[im 36/78  bone]
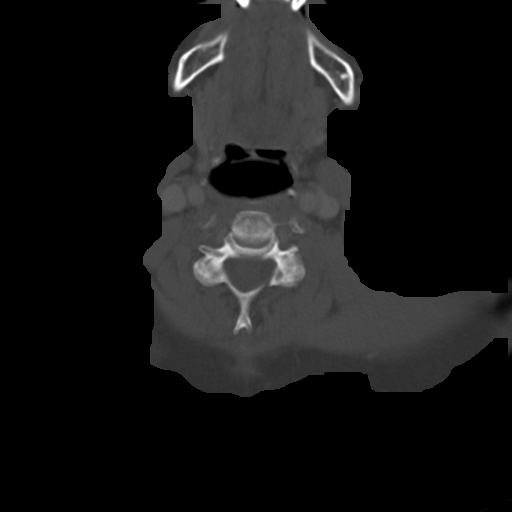
[im 42/78  bone]
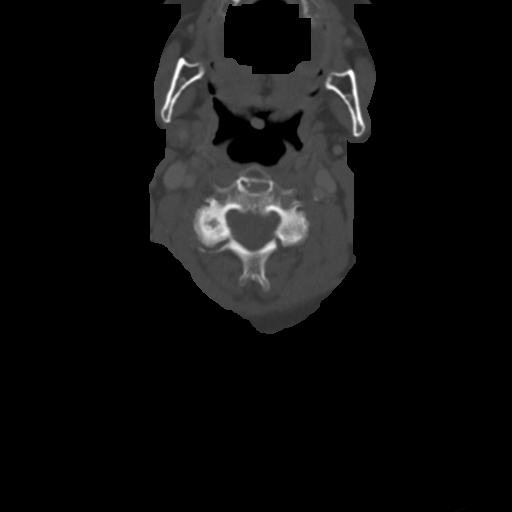
[im 48/78  bone]
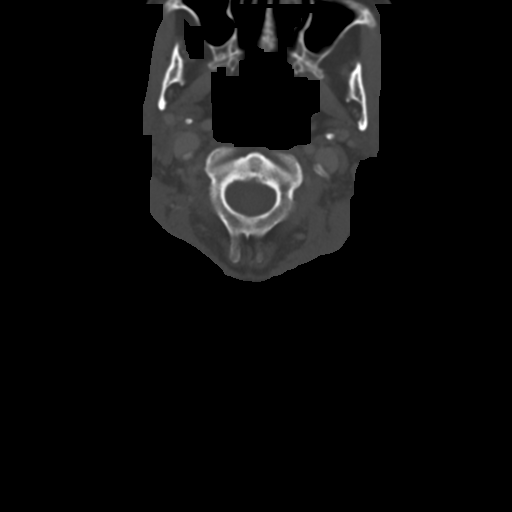
[im 54/78  soft-tissue]
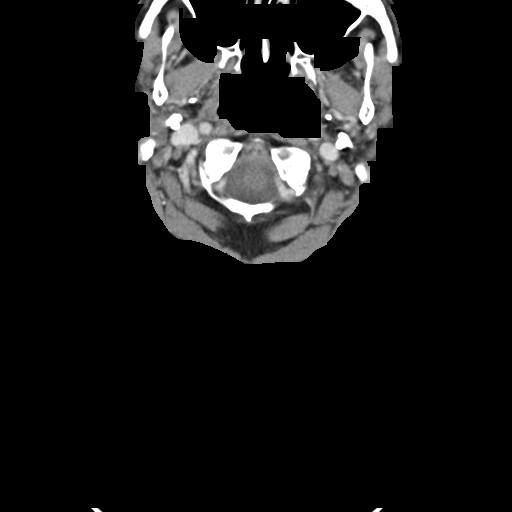
[im 54/78  bone]
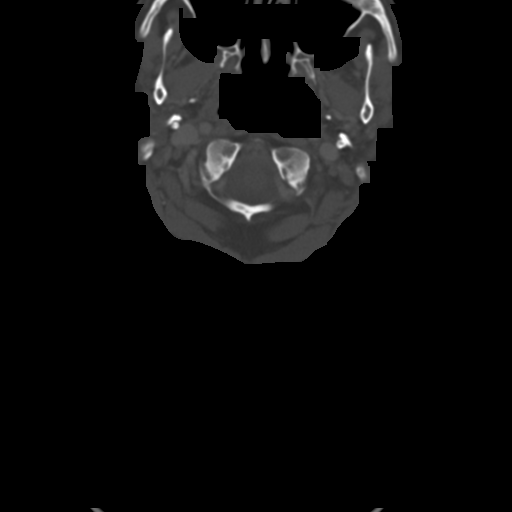
[im 60/78  bone]
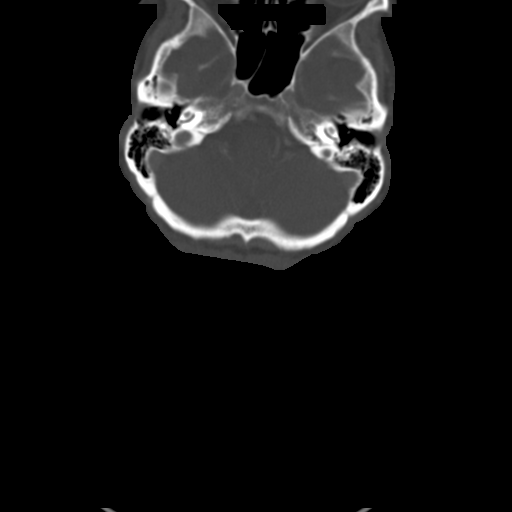
[im 66/78  bone]
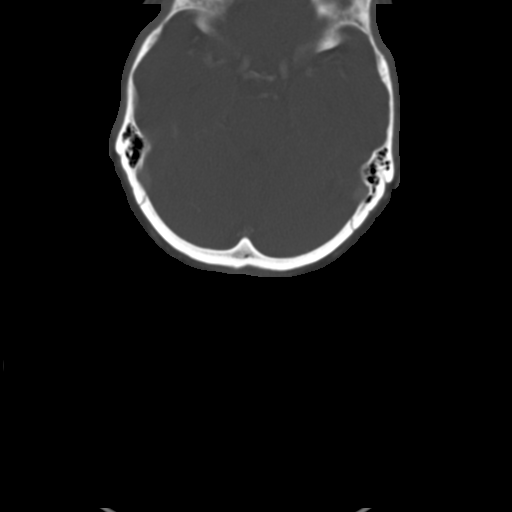
[im 72/78  bone]
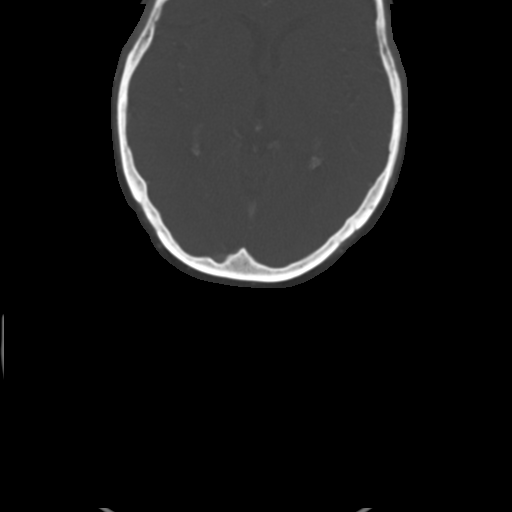

[12 of 14 positions shown; findings below may reference images not displayed]

FINDINGS: NECK:

There is no lymphadenopathy. There is no nasopharyngeal or oropharyngeal
mass. There is no focal fluid collection. The airway is patent. The
visualized portions of the carotid arteries and internal jugular veins are
patent.

The visualized portions of the brain demonstrate no focal abnormality.

The visualized paranasal sinuses are clear.

There is no lytic or blastic osseous lesion.

CHEST:

There is a spiculated 1.7 x 0.9 cm partially cavitary right upper lobe
pulmonary nodule. There is a moderate right pneumothorax. There is no
tension pneumothorax. There is no pleural fluid. There are bilateral severe
emphysematous changes.

The heart size is normal. There is no pericardial effusion.

There are no pathologically enlarged mediastinal, hilar, or axillary lymph
nodes.

The osseous structures demonstrate no focal abnormality.
IMPRESSION: 1. Moderate right pneumothorax.

2. Spiculated 1.7 x 0.9 cm partially cavitary right upper lobe pulmonary
nodule. The appearance is most concerning for malignancy until proven
otherwise.

[REDACTED]

## 2013-08-12 IMAGING — CR DG CHEST 1V PORT
1 series · 1 of 1 positions shown · non-contrast
Comparison: none

REASON FOR EXAM: CP RECENT LUNG BX
COMMENTS:

PROCEDURE:     DXR - DXR PORTABLE CHEST SINGLE VIEW  - May 01, 2012  [DATE]
RESULT:      Comparison: 04/10/2012

[ap]
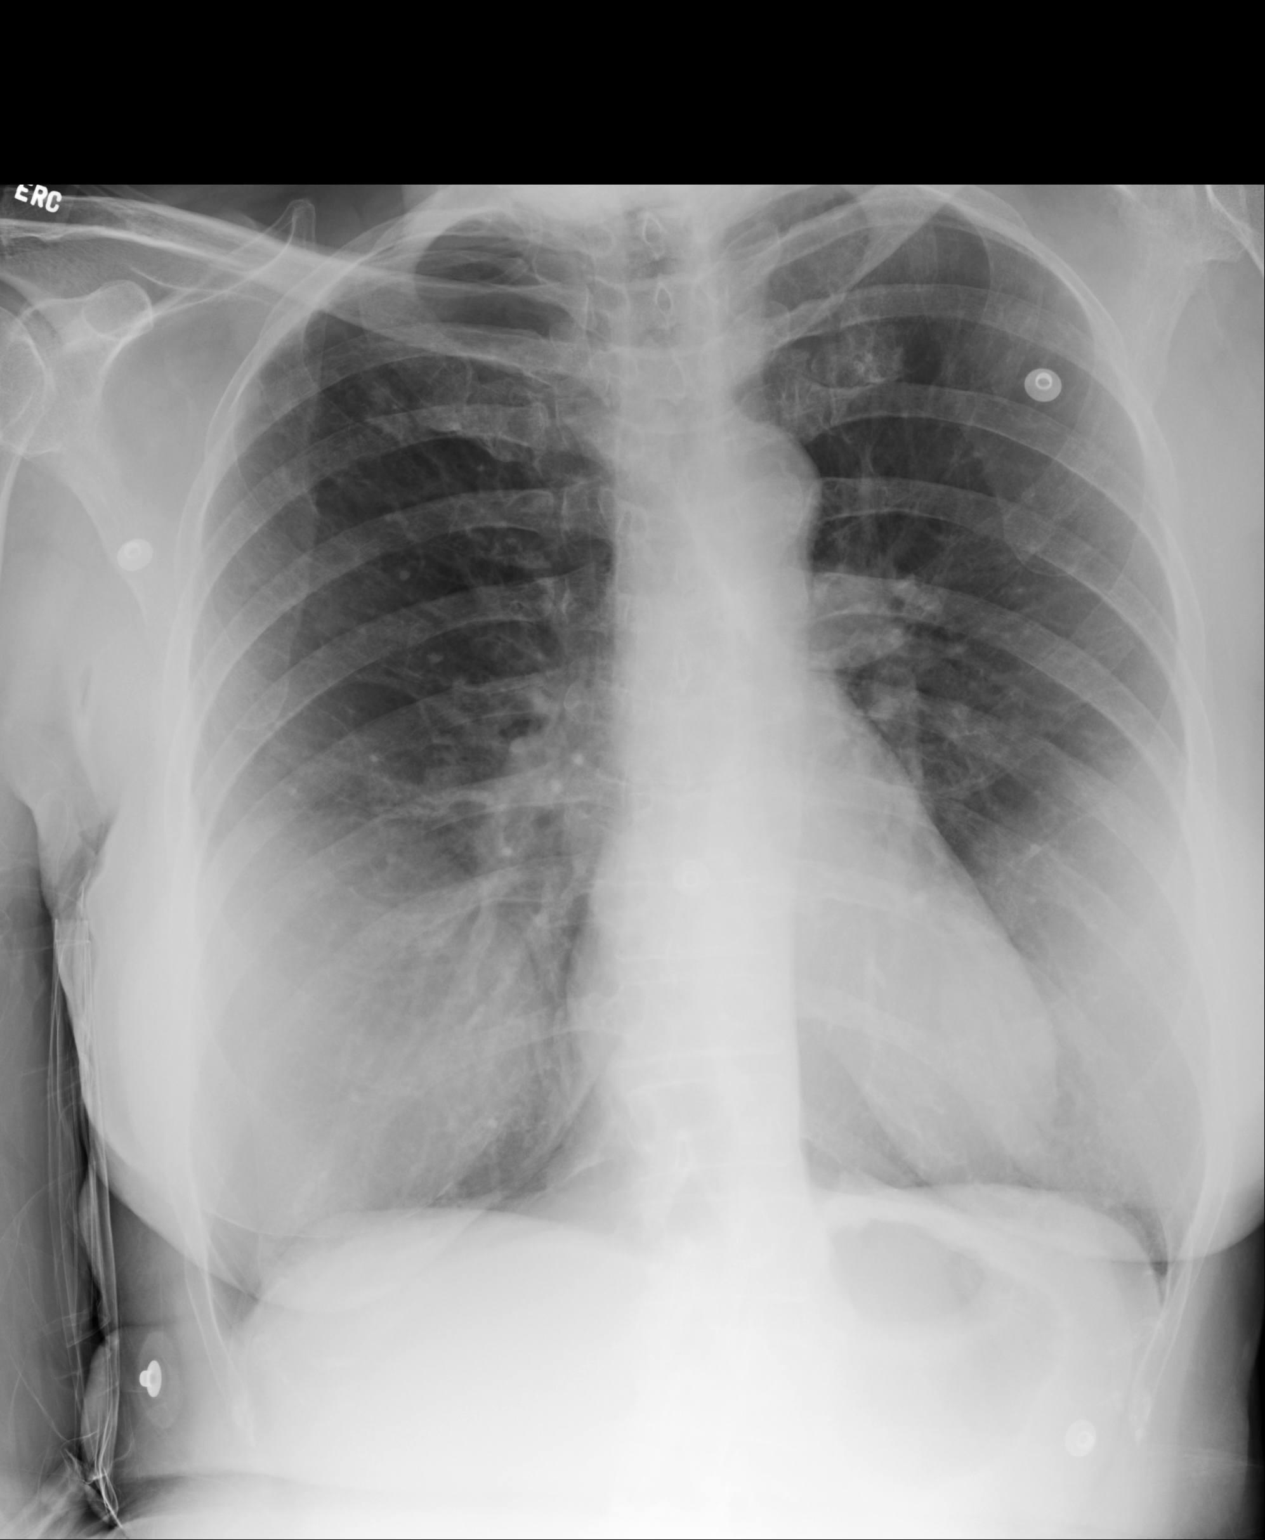

[1 of 1 positions shown; findings below may reference images not displayed]

FINDINGS: Single portable AP chest radiograph is provided. There is a spiculated right
apical pulmonary nodule. The lungs are hyperinflated likely secondary to
COPD. There is a small right pneumothorax. There is no focal parenchymal
opacity, pleural effusion, or left pneumothorax. Normal cardiomediastinal
silhouette. The osseous structures are unremarkable.
IMPRESSION: There is a small right pneumothorax.

Spiculated right apical pulmonary nodule again noted.

[REDACTED]

## 2013-08-13 IMAGING — CR DG CHEST 1V PORT
1 series · 1 of 1 positions shown · non-contrast
Comparison: none

REASON FOR EXAM: PTX
COMMENTS:

PROCEDURE:     DXR - DXR PORTABLE CHEST SINGLE VIEW  - May 02, 2012  [DATE]
RESULT:     Comparison: 05/01/2012

[ap]
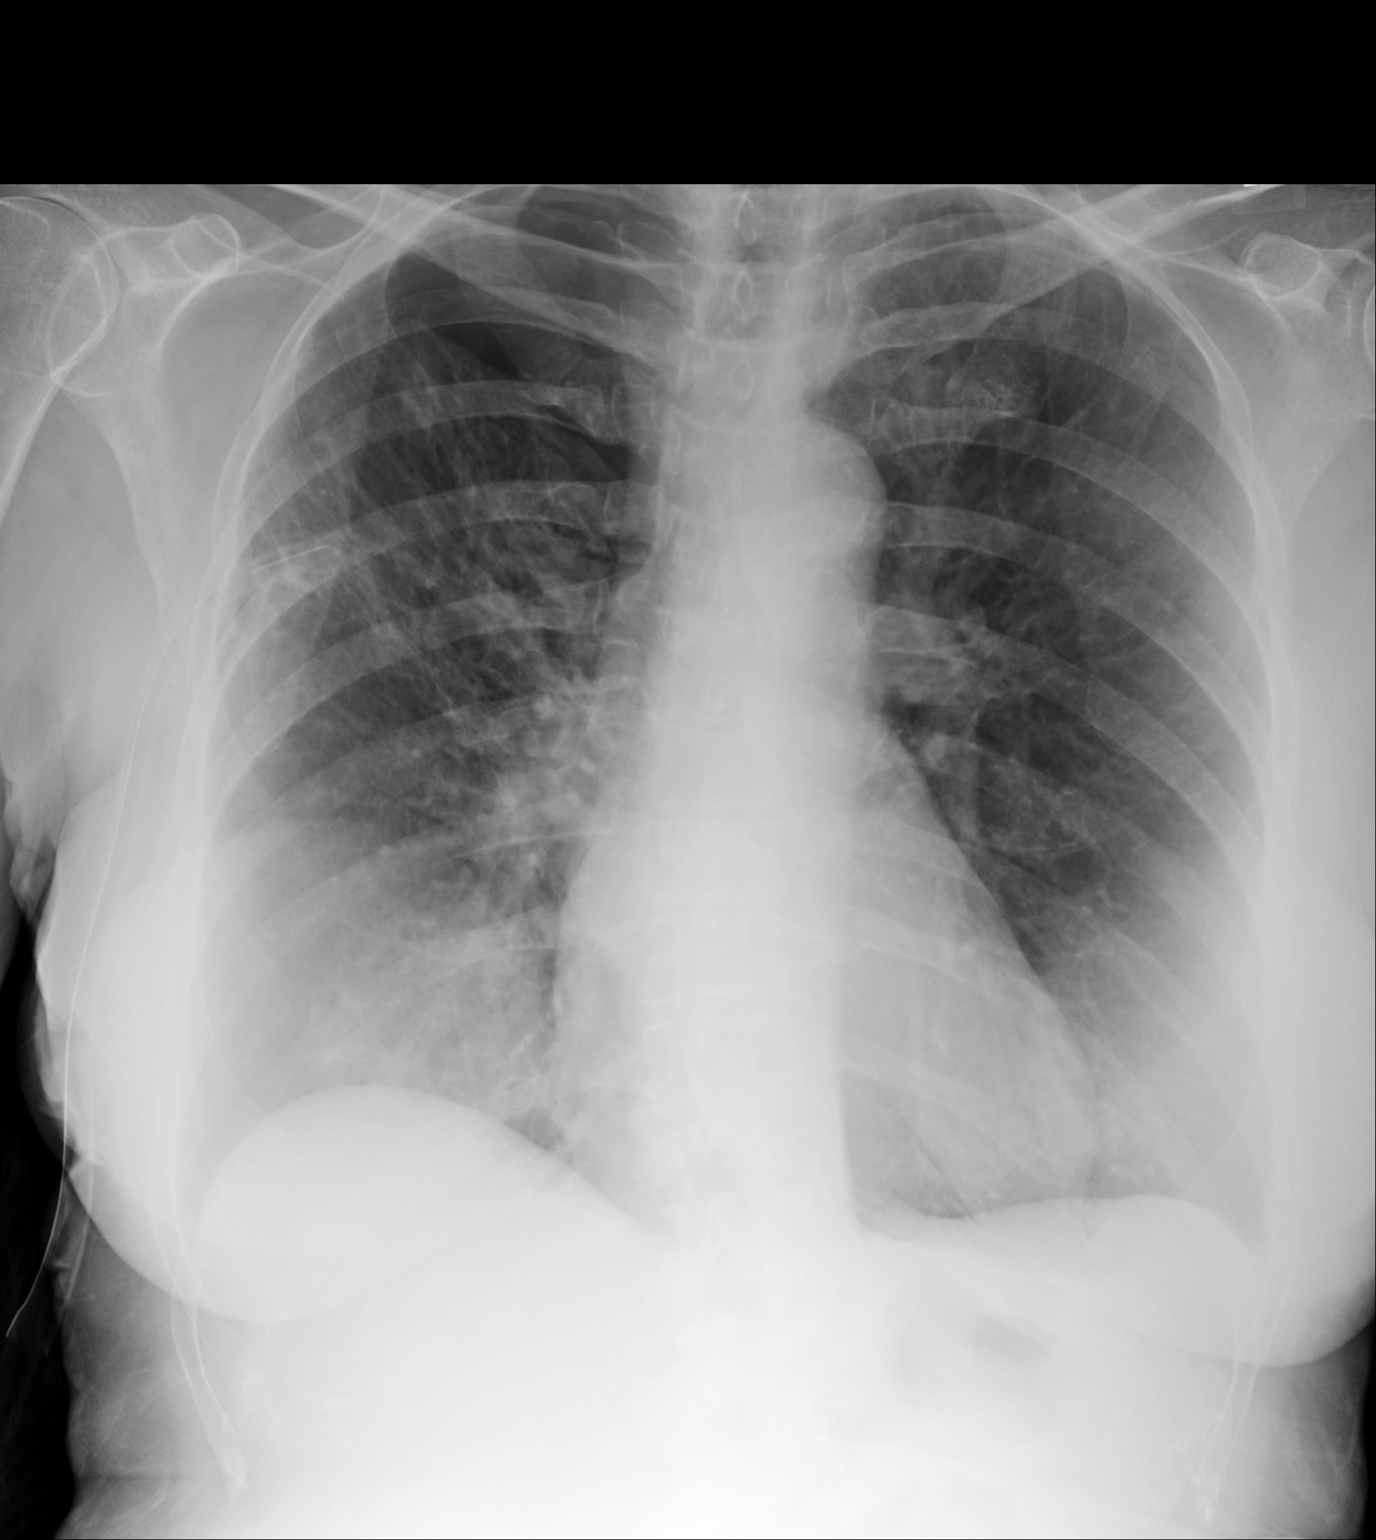

[1 of 1 positions shown; findings below may reference images not displayed]

FINDINGS: Single portable AP chest radiograph is provided. There is a right-sided
chest tube. There is a moderate pneumothorax measuring 4.5 cm in thickness
along the apex. There is no focal parenchymal opacity, pleural effusion, or
pneumothorax. Normal cardiomediastinal silhouette. The osseous structures
are unremarkable.
IMPRESSION: Right-sided chest tube with a moderate persistent apical  pneumothorax.

[REDACTED]

## 2013-08-14 IMAGING — CR DG CHEST 1V PORT
1 series · 1 of 1 positions shown · non-contrast
Comparison: none

REASON FOR EXAM: ptx. evalue. ha chest tube
COMMENTS:

[ap]
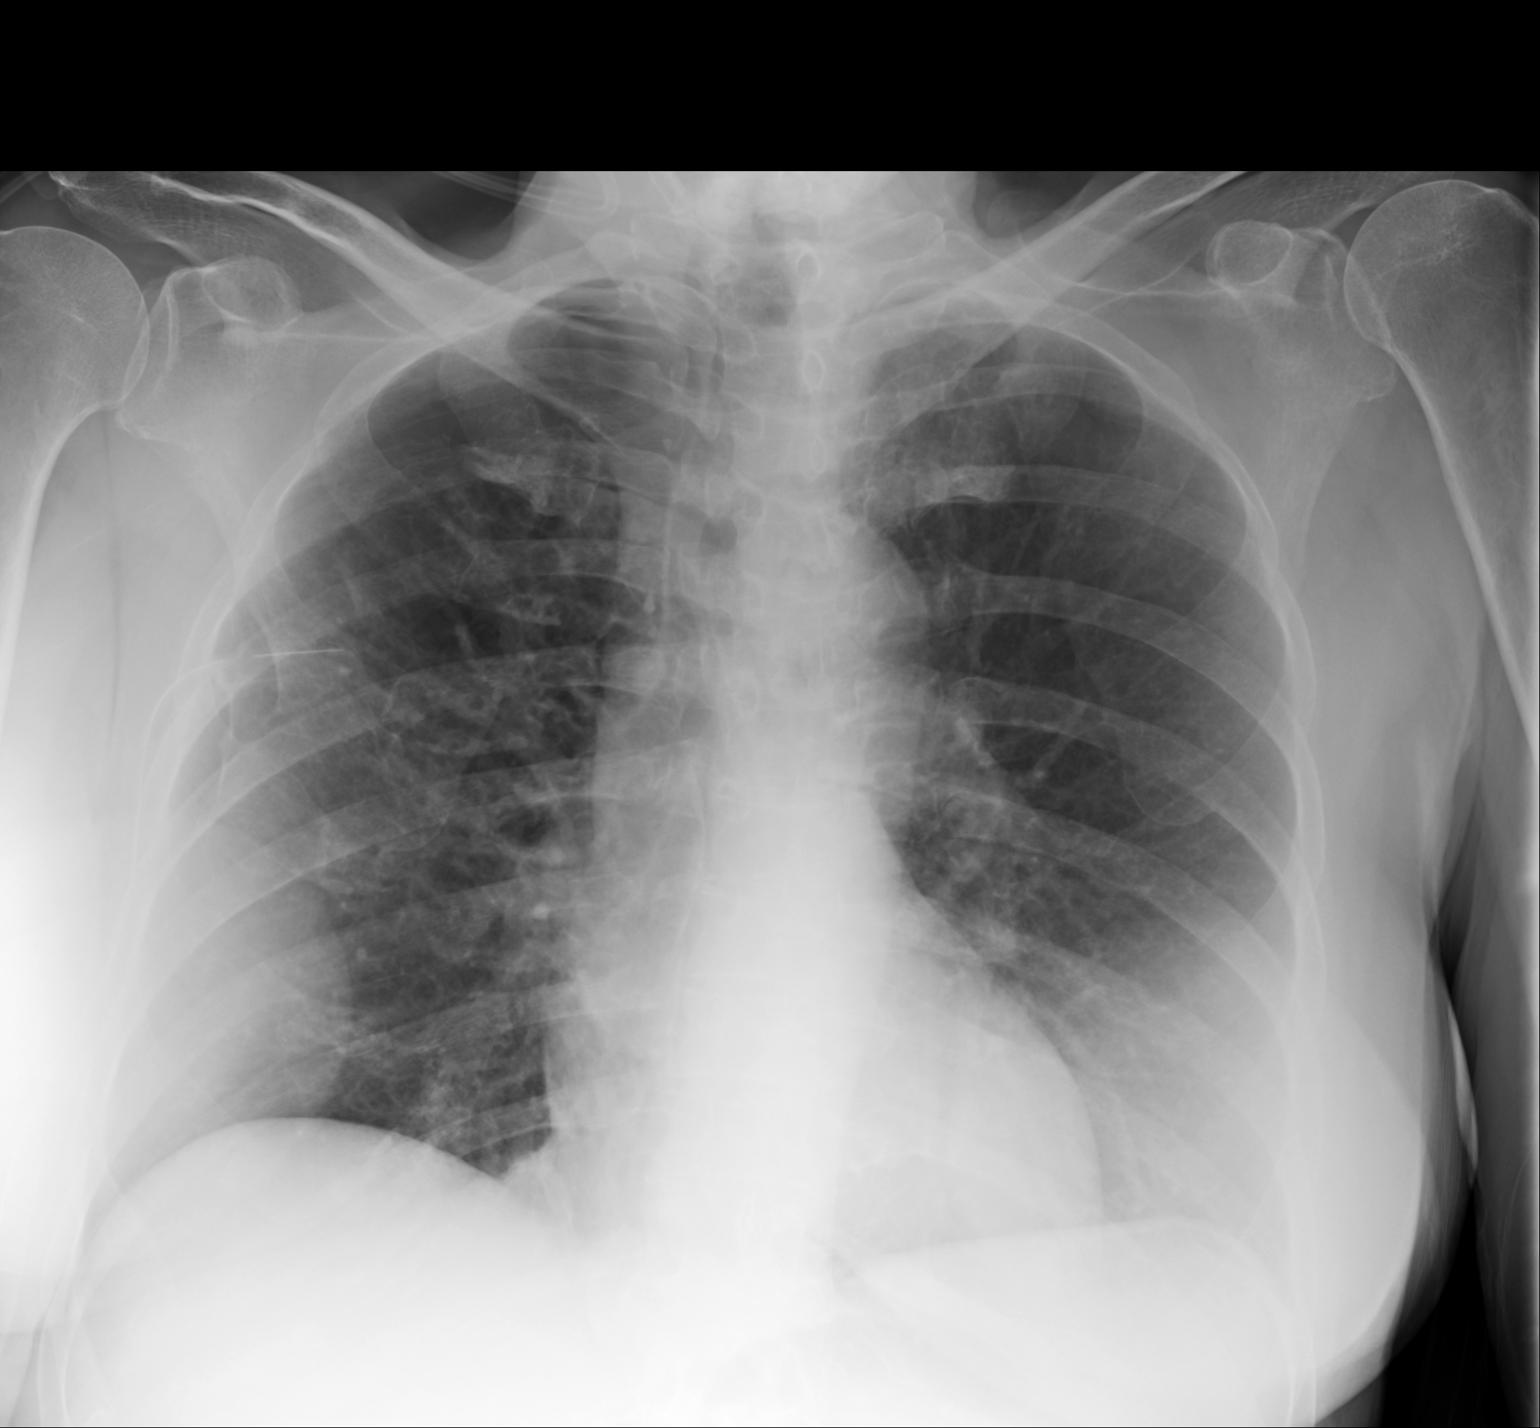

[1 of 1 positions shown; findings below may reference images not displayed]

PROCEDURE:     DXR - DXR PORTABLE CHEST SINGLE VIEW  - May 03, 2012 [DATE]

RESULT:     Comparison is made to the study May 02, 2012.

The patient's known pneumothorax on the right previously visible in the
upper hemithorax is not clearly evident today. There appears to have been
partial if not total reexpansion. There is no shift of the midline. The left
lung is clear. The interstitial markings the right lung remain increased. A
chest tube is in place with the tip overlying the anterior aspect of the
right third rib. The cardiac silhouette is normal in size.
IMPRESSION: There has been successful reexpansion of the right lung. I
cannot exclude a 10% or less residual pneumothorax in the apex obscured by
overlying bony structures.

[REDACTED]

## 2013-08-15 IMAGING — CR DG CHEST 1V PORT
1 series · 1 of 1 positions shown · non-contrast
Comparison: none

REASON FOR EXAM: assess for pleural effusion
COMMENTS:

PROCEDURE:     DXR - DXR PORTABLE CHEST SINGLE VIEW  - May 04, 2012 [DATE]
RESULT:     Comparison: 05/03/2012

[ap]
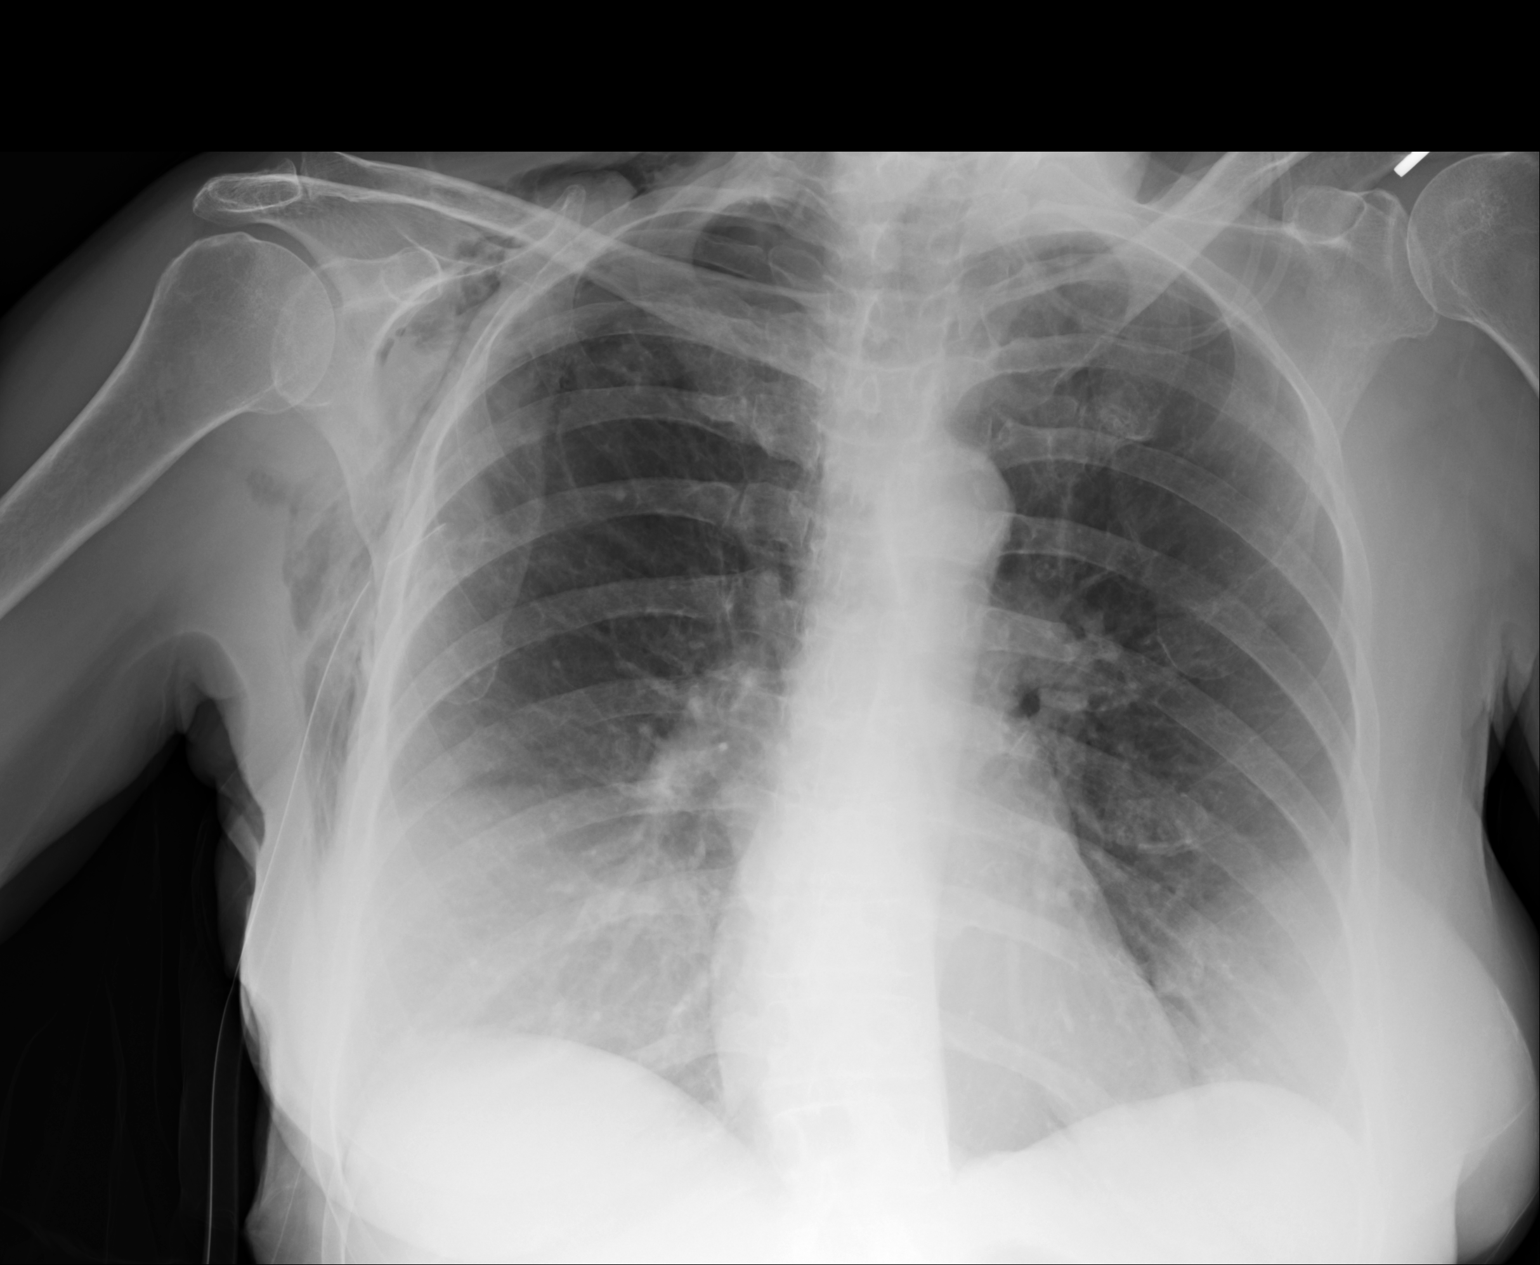

[1 of 1 positions shown; findings below may reference images not displayed]

FINDINGS: The right chest tube has retracted and the side-port now terminates
peripheral to the ribs. There is new subcutaneous emphysema along the right
chest wall and neck. Small right pneumothorax is increased from prior. The
heart and mediastinum are stable. Increased density at the lung bases is
likely secondary to and overlying breast implants.
IMPRESSION: Small right pneumothorax is increased from prior. The side-port of the chest
tube is now located peripheral to the rib cage.

[REDACTED]

## 2013-08-16 IMAGING — CR DG CHEST 1V PORT
1 series · 1 of 1 positions shown · non-contrast
Comparison: none

REASON FOR EXAM: assess for pleural effusion
COMMENTS:

PROCEDURE:     DXR - DXR PORTABLE CHEST SINGLE VIEW  - May 05, 2012  [DATE]
RESULT:     Comparison: 05/04/2012

[ap]
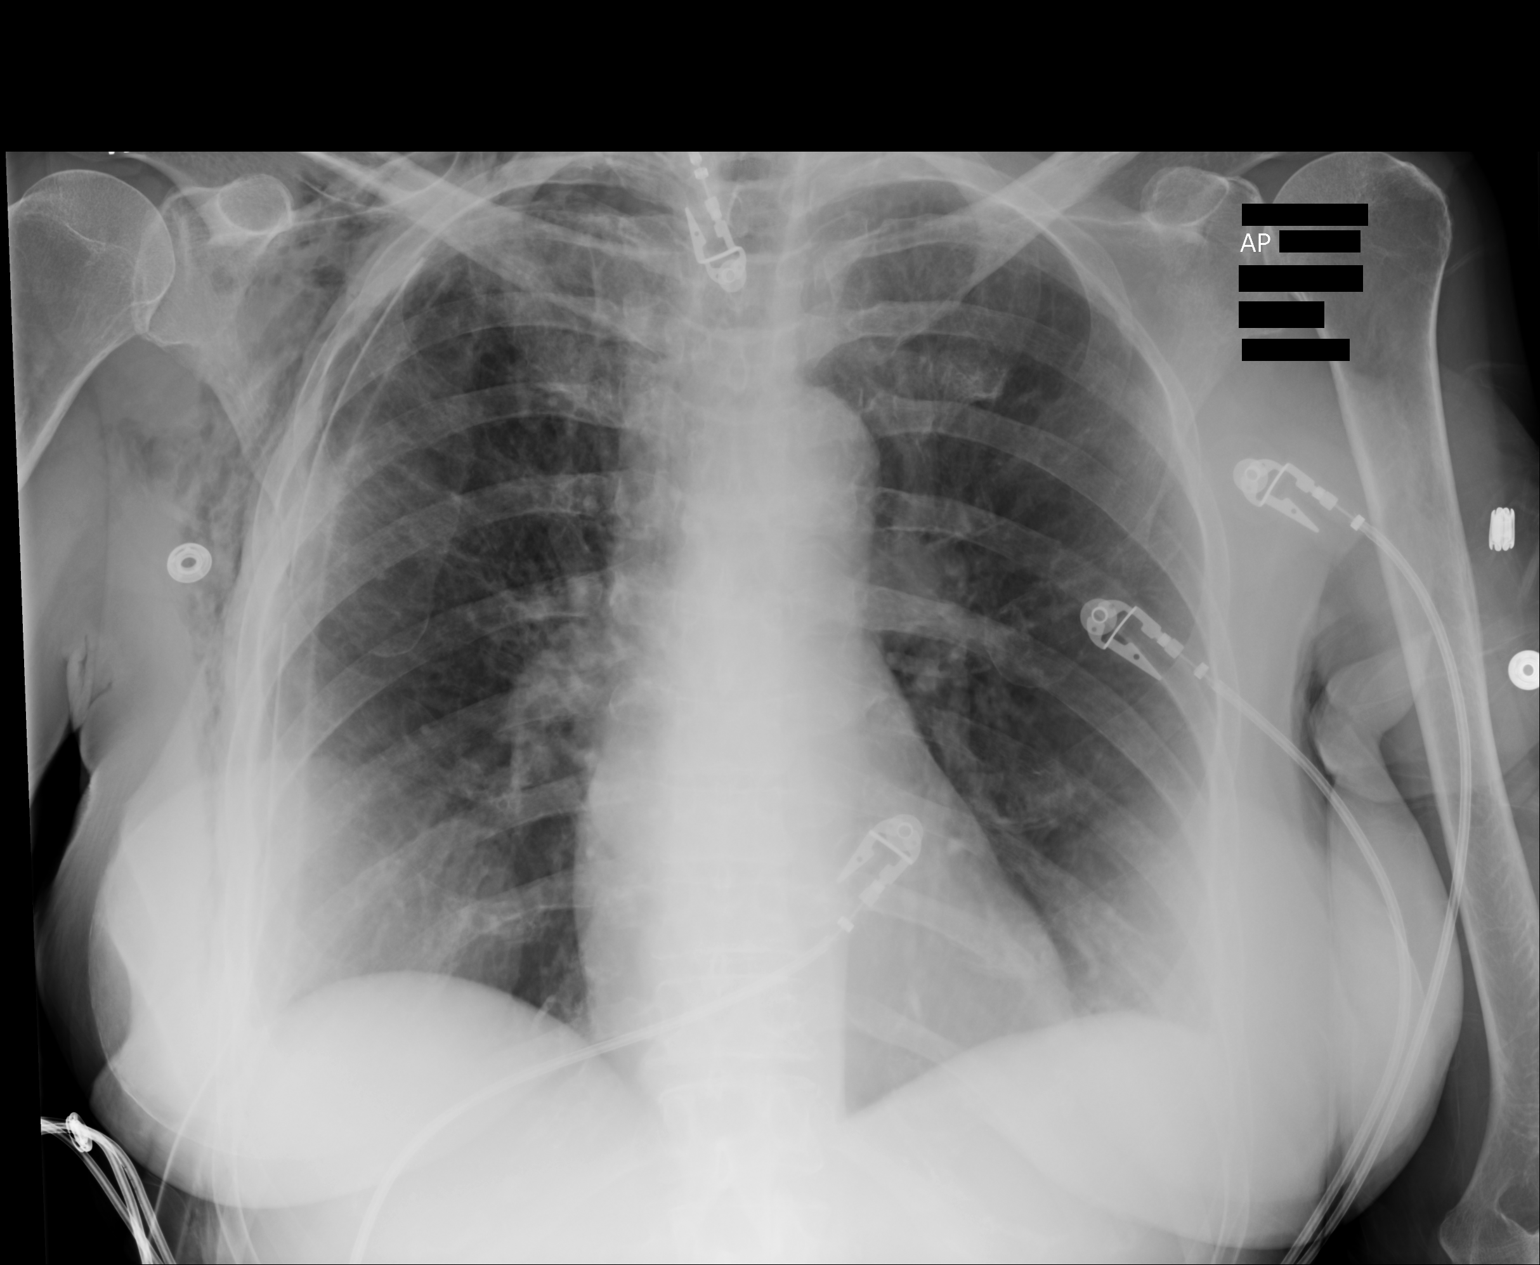

[1 of 1 positions shown; findings below may reference images not displayed]

FINDINGS: Single portable AP chest radiograph is provided. There is a right-sided
chest tube directed towards the apex. There is no definite pneumothorax.
There is no focal consolidation. There is no pleural effusion. Normal
cardiomediastinal silhouette. The osseous structures are unremarkable. There
is a large amount of subcutaneous emphysema along the right chest wall
extending into the neck.
IMPRESSION: Please see above.

[REDACTED]

## 2013-08-17 IMAGING — CR DG CHEST 1V PORT
1 series · 1 of 1 positions shown · non-contrast
Comparison: none

REASON FOR EXAM: Assess for Pleural Effusion
COMMENTS:

PROCEDURE:     DXR - DXR PORTABLE CHEST SINGLE VIEW  - May 06, 2012  [DATE]
RESULT:     Comparison: 05/04/2012

[ap]
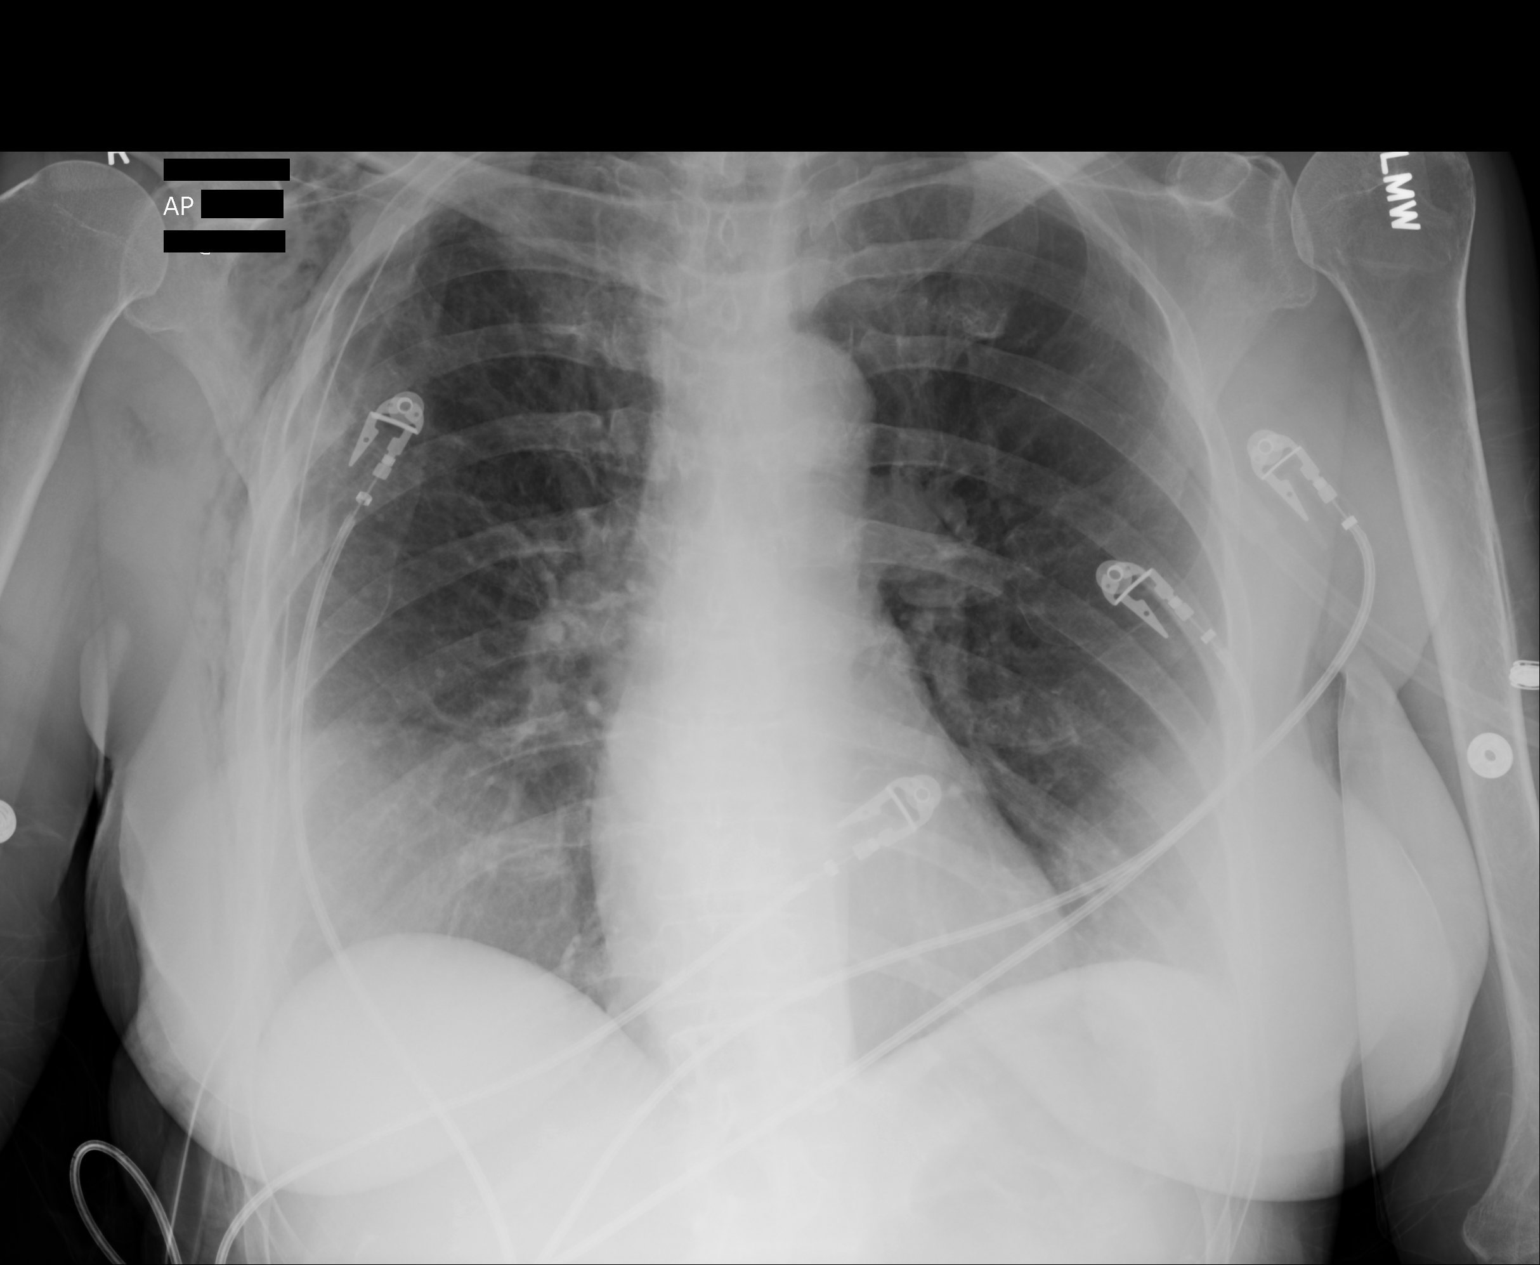

[1 of 1 positions shown; findings below may reference images not displayed]

FINDINGS: Single portable AP chest radiograph is provided. There is a right-sided
chest tube directed towards the apex. There is no definite pneumothorax.
There is no focal consolidation. There is no pleural effusion. Normal
cardiomediastinal silhouette. The osseous structures are unremarkable. There
is a large amount of subcutaneous emphysema along the right chest wall
extending into the neck.
IMPRESSION: Please see above.

[REDACTED]

## 2013-08-18 ENCOUNTER — Encounter: Payer: Self-pay | Admitting: Family Medicine

## 2013-08-18 IMAGING — CR DG CHEST 1V PORT
1 series · 1 of 1 positions shown · non-contrast
Comparison: none

REASON FOR EXAM: Assess for Pleural Effusion
COMMENTS:

PROCEDURE:     DXR - DXR PORTABLE CHEST SINGLE VIEW  - May 07, 2012  [DATE]
RESULT:     History: Pleural effusion.
Comparison Study: Prior chest x-ray of 05/06/2012.

[ap]
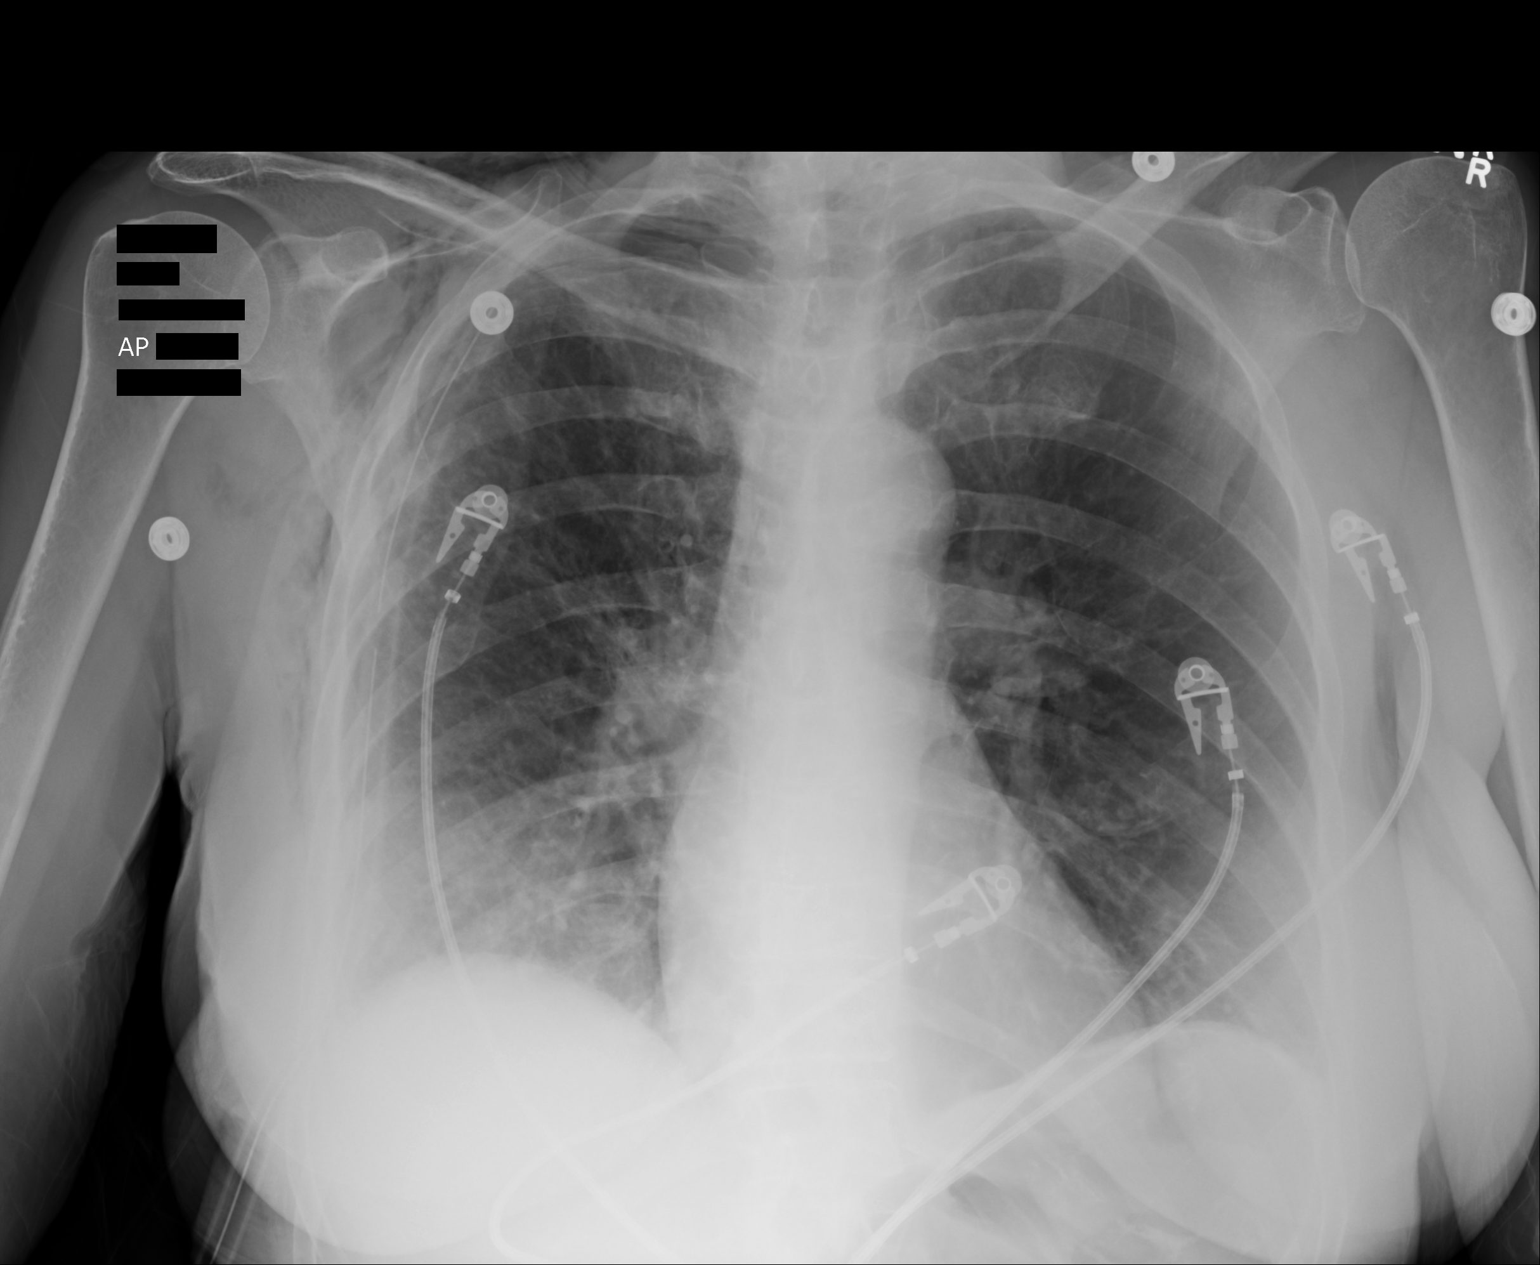

[1 of 1 positions shown; findings below may reference images not displayed]

FINDINGS: Right chest tube. Tiny apical pneumothorax cannot be entirely
excluded. Subcutaneous emphysema is present on the right. Similar finding
noted on prior exam. Lungs clear. Heart size normal. Bilateral breast
implants.
IMPRESSION: Stable chest with chest tube in stable position. Very tiny
apical pneumothorax cannot be excluded. Subcutaneous emphysema right chest
and neck remain.

## 2013-08-19 IMAGING — CR DG CHEST 1V PORT
1 series · 1 of 1 positions shown · non-contrast
Comparison: none

REASON FOR EXAM: Assess for Pleural Effusion
COMMENTS:

PROCEDURE:     DXR - DXR PORTABLE CHEST SINGLE VIEW  - May 08, 2012  [DATE]
RESULT:     Comparison: 05/07/2012

[ap]
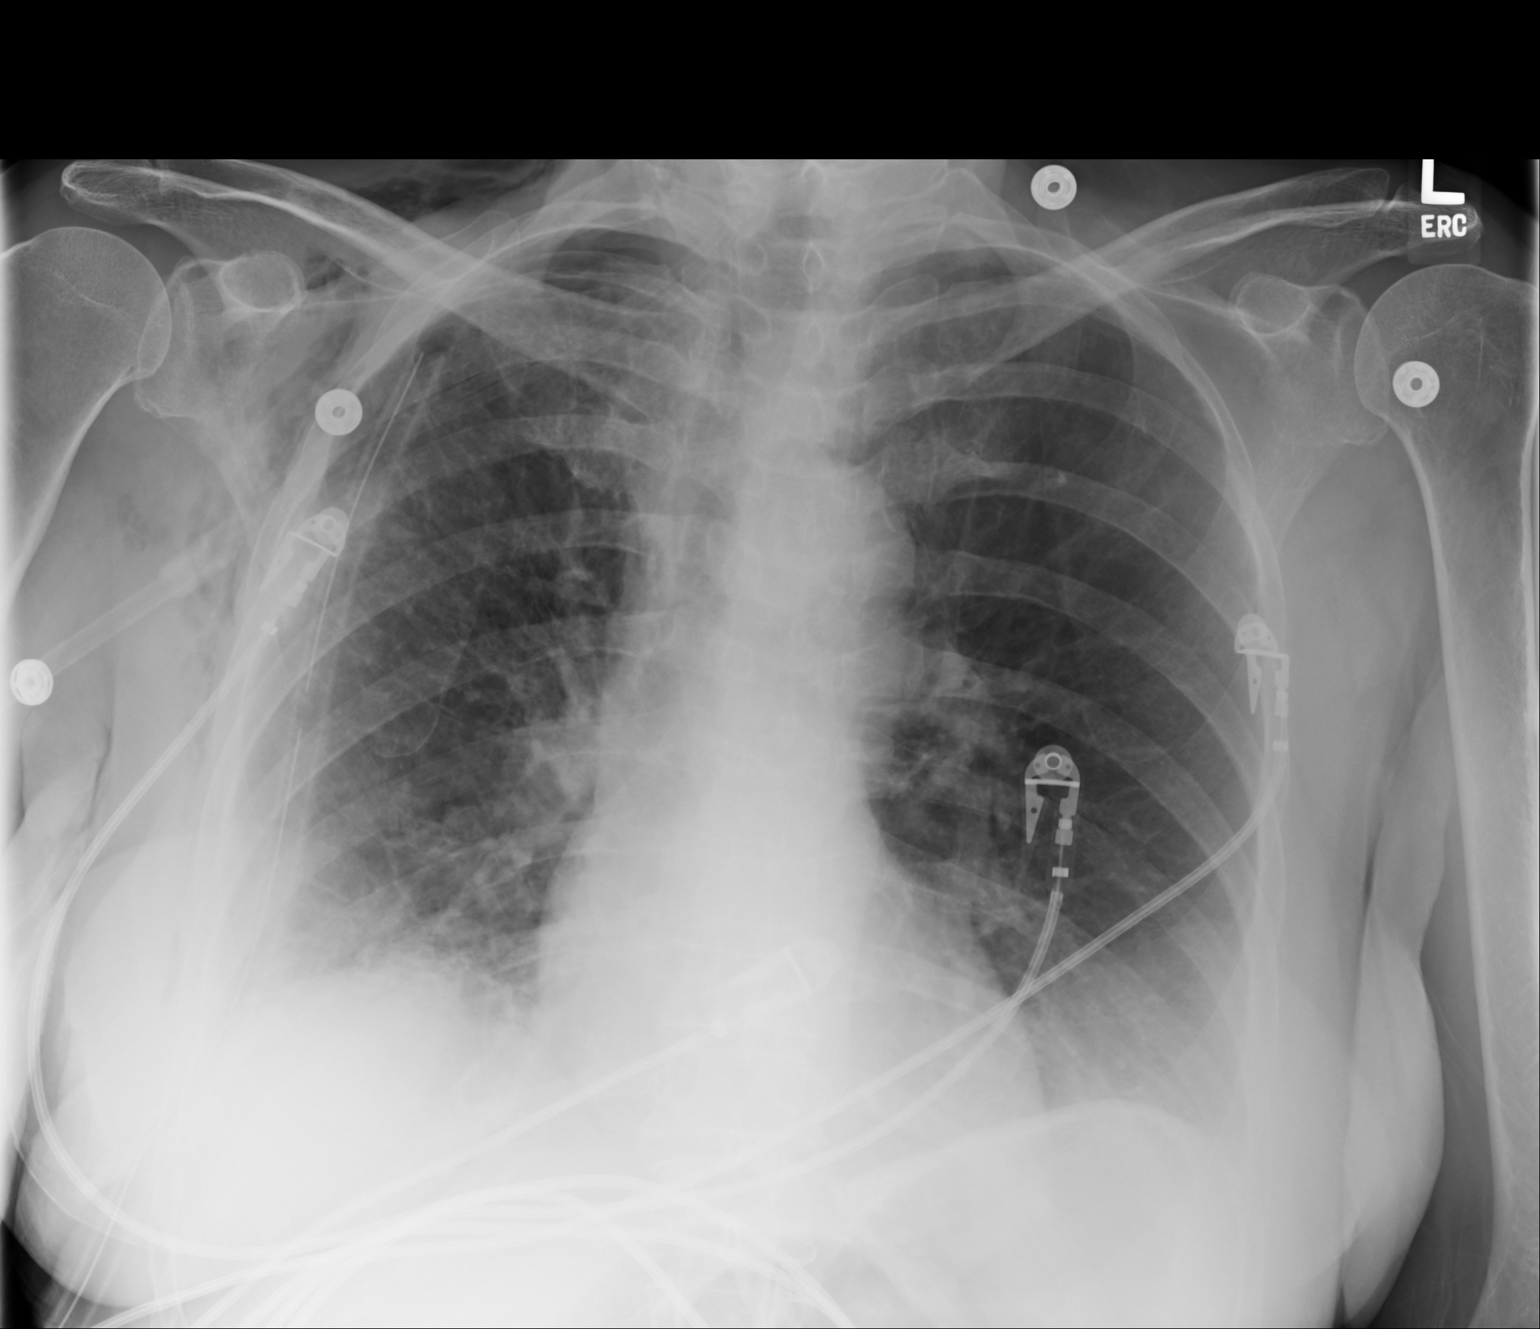

[1 of 1 positions shown; findings below may reference images not displayed]

FINDINGS: The heart and mediastinum are stable. Small right pleural effusion is
similar to prior. Right-sided chest tube is in place. There are mild
heterogeneous opacities in the right lung which are similar to prior. These
may be secondary to atelectasis. Subcutaneous emphysema along the right
chest wall and neck is similar to prior.
IMPRESSION: Small right pneumothorax is similar to prior, with right chest tube in place.

## 2013-08-20 IMAGING — CR DG CHEST 1V PORT
1 series · 1 of 1 positions shown · non-contrast
Comparison: none

REASON FOR EXAM: assess for plueural effusion
COMMENTS:

PROCEDURE:     DXR - DXR PORTABLE CHEST SINGLE VIEW  - May 09, 2012  [DATE]
RESULT:     Comparison: 05/08/2012

[ap]
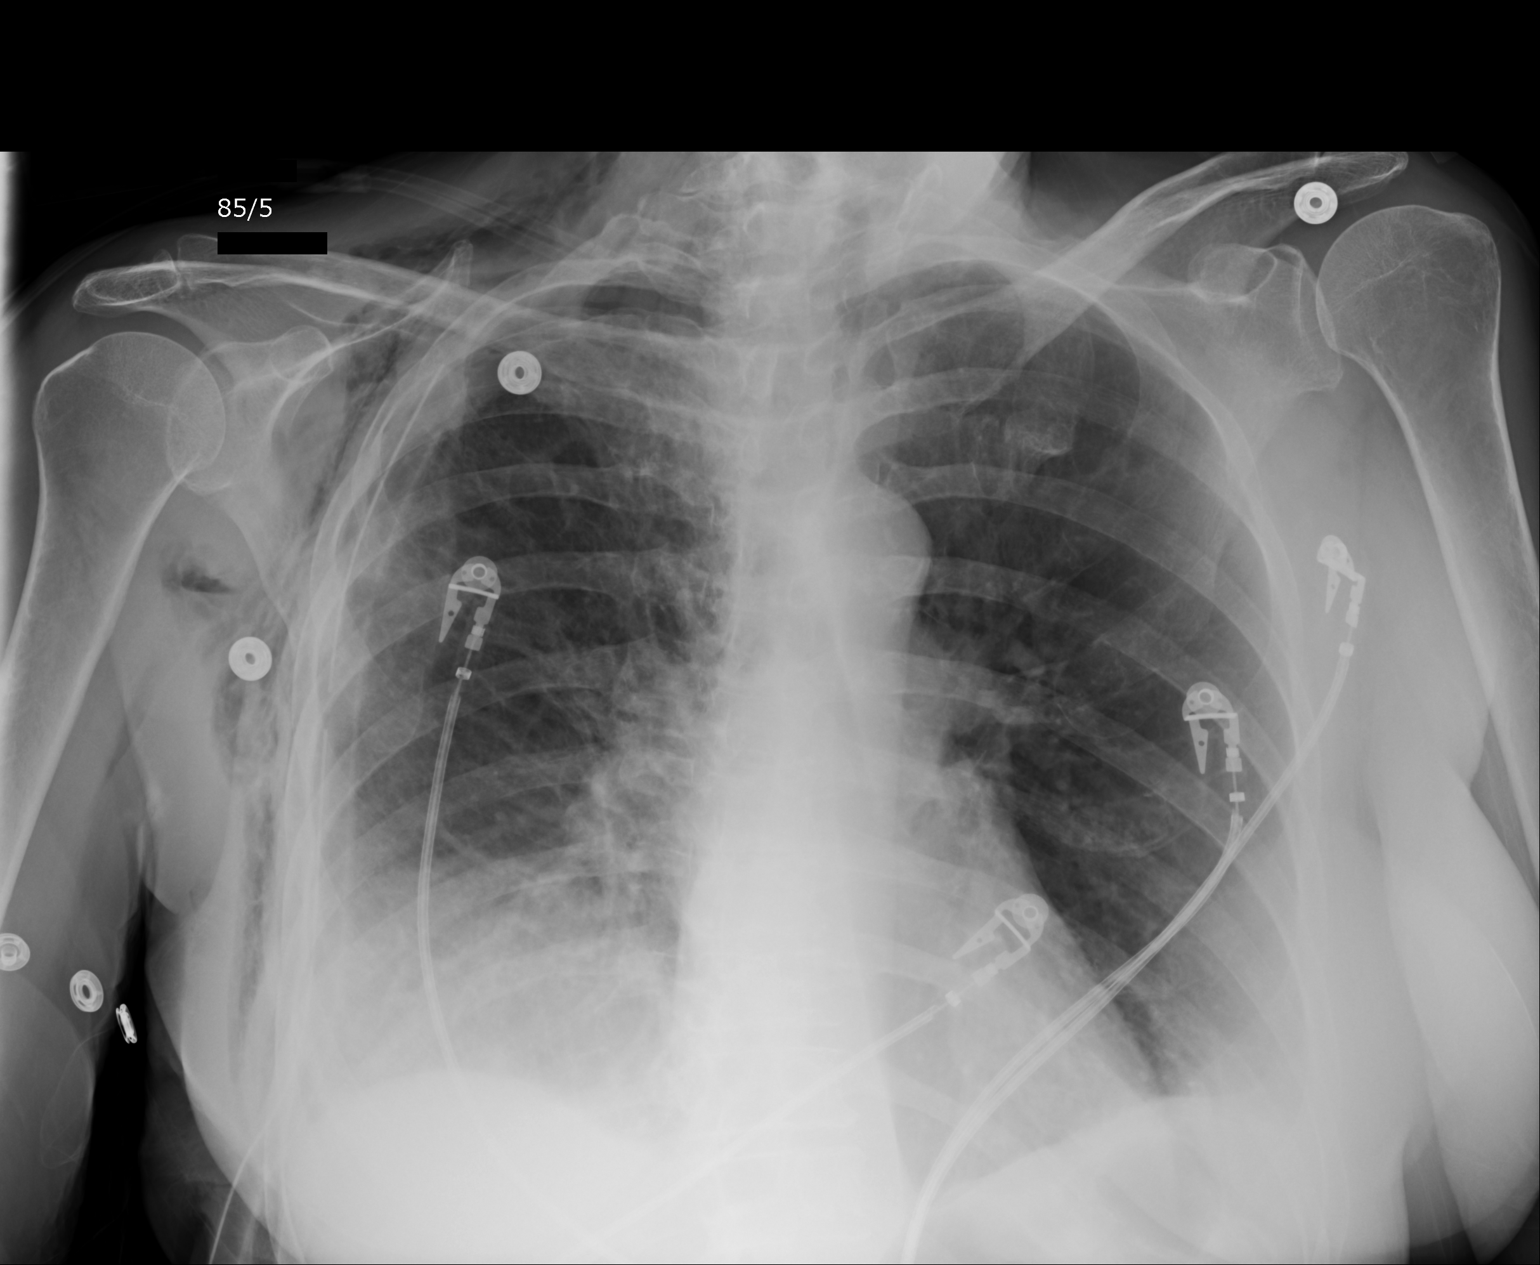

[1 of 1 positions shown; findings below may reference images not displayed]

FINDINGS: The heart and mediastinum are stable. The right chest tube remains. Small
right pneumothorax is similar to prior. Subcutaneous emphysema along the
right chest wall is similar to slightly increased from prior. Opacities at
the right lung base are likely due to a combination of atelectasis and
overlying prosthetic breast implant.
IMPRESSION: Small right pneumothorax is similar to prior. Subcutaneous emphysema along
the right chest wall is similar to slightly increased from prior.

[REDACTED]

## 2013-08-21 IMAGING — CR DG CHEST 1V PORT
1 series · 1 of 1 positions shown · non-contrast
Comparison: none

REASON FOR EXAM: increased shortness of breath, tachycardia, sq emphysema
COMMENTS:

[ap]
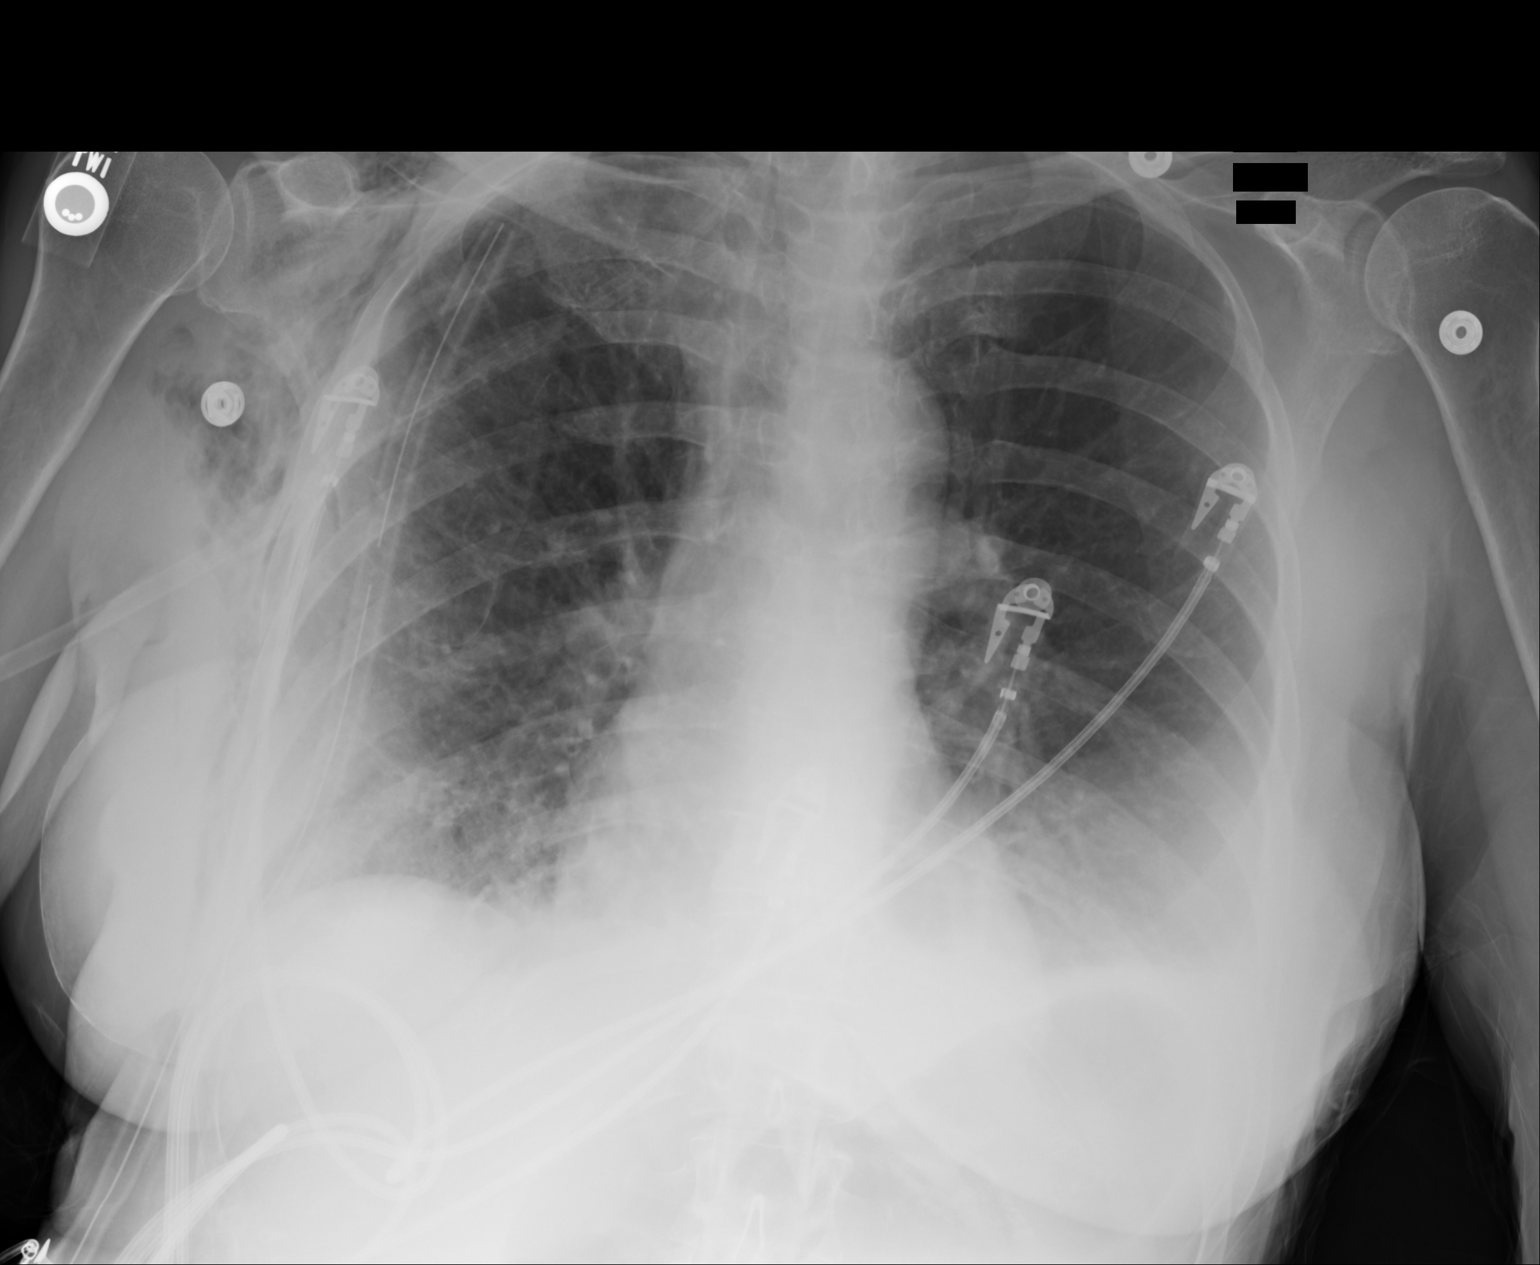

[1 of 1 positions shown; findings below may reference images not displayed]

PROCEDURE:     DXR - DXR PORTABLE CHEST SINGLE VIEW  - May 10, 2012  [DATE]

RESULT:     Comparison made to prior study of 05/10/2012. Chest tube in in
stable position with a tiny apical pneumothorax. Subcutaneous air noted on
the right. Bilateral breast implants. Lungs are clear . Heart size and
pulmonary vascularity normal.
IMPRESSION: Stable chest. Tiny apical pneumothorax on the right. Stable
positioning of chest tube. Stable subcutaneous emphysema.

## 2013-08-21 IMAGING — CR DG CHEST 1V PORT
1 series · 1 of 1 positions shown · non-contrast
Comparison: none

REASON FOR EXAM: f/u ptx
COMMENTS:

[ap]
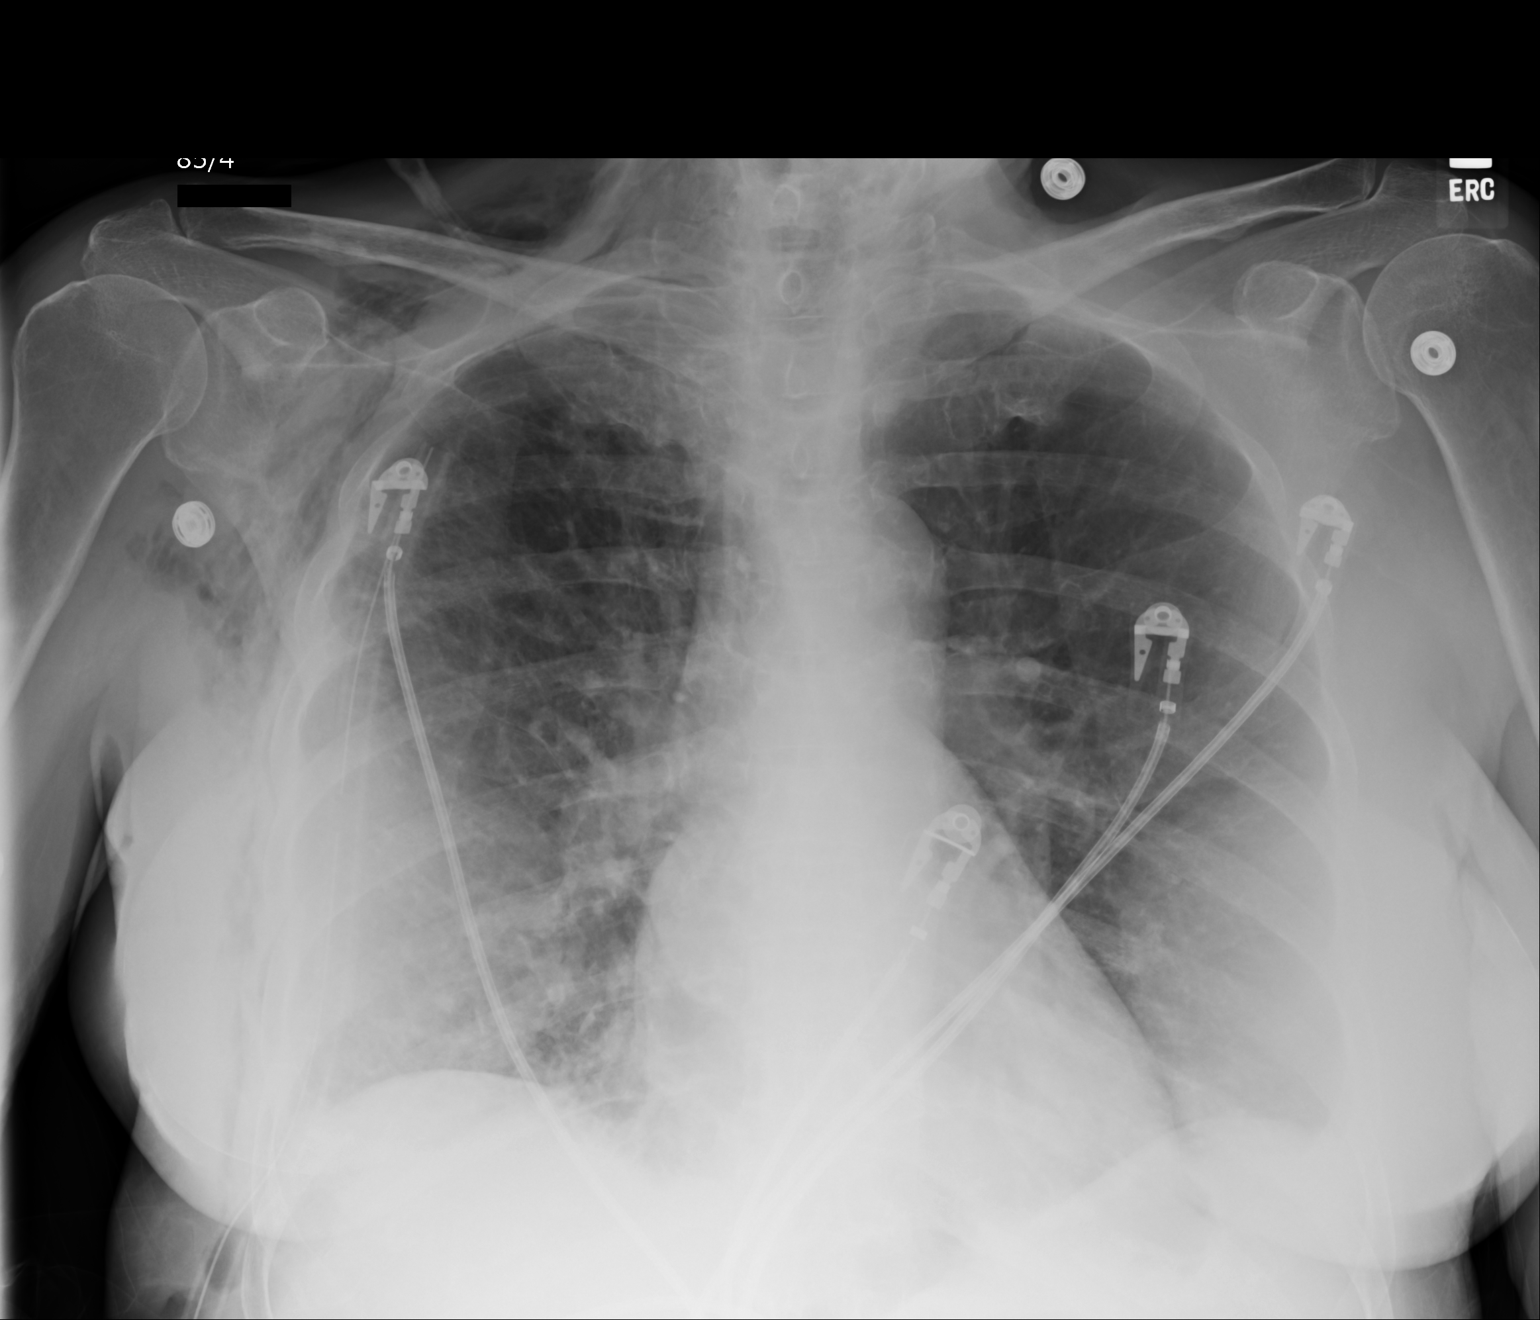

[1 of 1 positions shown; findings below may reference images not displayed]

PROCEDURE:     DXR - DXR PORTABLE CHEST SINGLE VIEW  - May 10, 2012  [DATE]

RESULT:     Comparison is made to the previous examination 09 May, 2012.

Right chest tube remains in place laterally. There is significant
subcutaneous emphysema. No large pneumothorax is evident. There is some
presumed atelectasis of the lung bases. The heart is not enlarged. There is
no mediastinal shift. No significant pleural fluid collection is seen.
IMPRESSION: 1. Right chest tube remains in place. No large pneumothorax is evident. The
right apical pneumothorax seen previously is not as well demonstrated and
may be resolving area there appears to be some minimal atelectasis. No
significant pleural fluid is present.

[REDACTED]

## 2013-08-22 ENCOUNTER — Encounter: Payer: Self-pay | Admitting: Family Medicine

## 2013-08-22 ENCOUNTER — Ambulatory Visit (INDEPENDENT_AMBULATORY_CARE_PROVIDER_SITE_OTHER): Payer: Medicare Other | Admitting: Family Medicine

## 2013-08-22 VITALS — BP 160/92 | HR 91 | Temp 98.1°F | Wt 160.2 lb

## 2013-08-22 DIAGNOSIS — J441 Chronic obstructive pulmonary disease with (acute) exacerbation: Secondary | ICD-10-CM

## 2013-08-22 DIAGNOSIS — R109 Unspecified abdominal pain: Secondary | ICD-10-CM

## 2013-08-22 IMAGING — CR DG CHEST 2V
1 series · 2 of 2 positions shown · non-contrast
Comparison: none

REASON FOR EXAM: assess for pleural effusion
COMMENTS:

[Series 1: ap · 0.17mm/px · 2 of 2 slices shown]
[im 1/2]
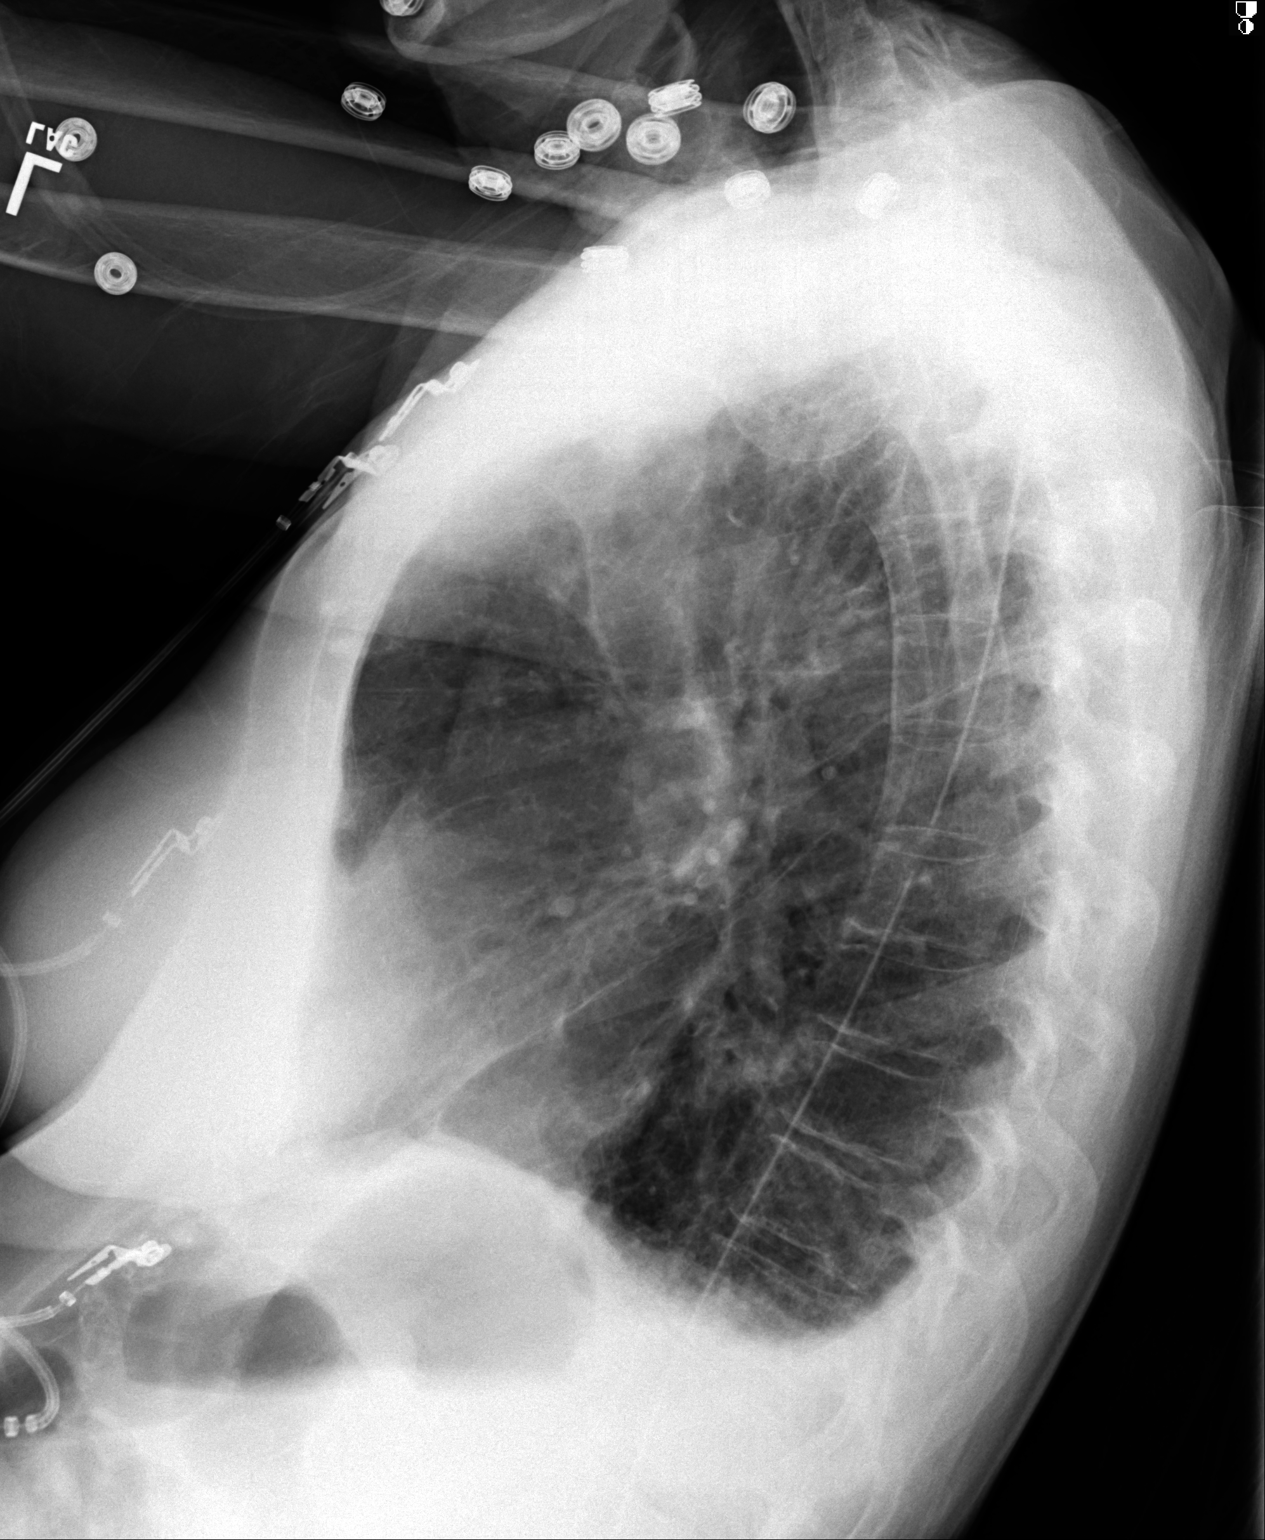
[im 2/2]
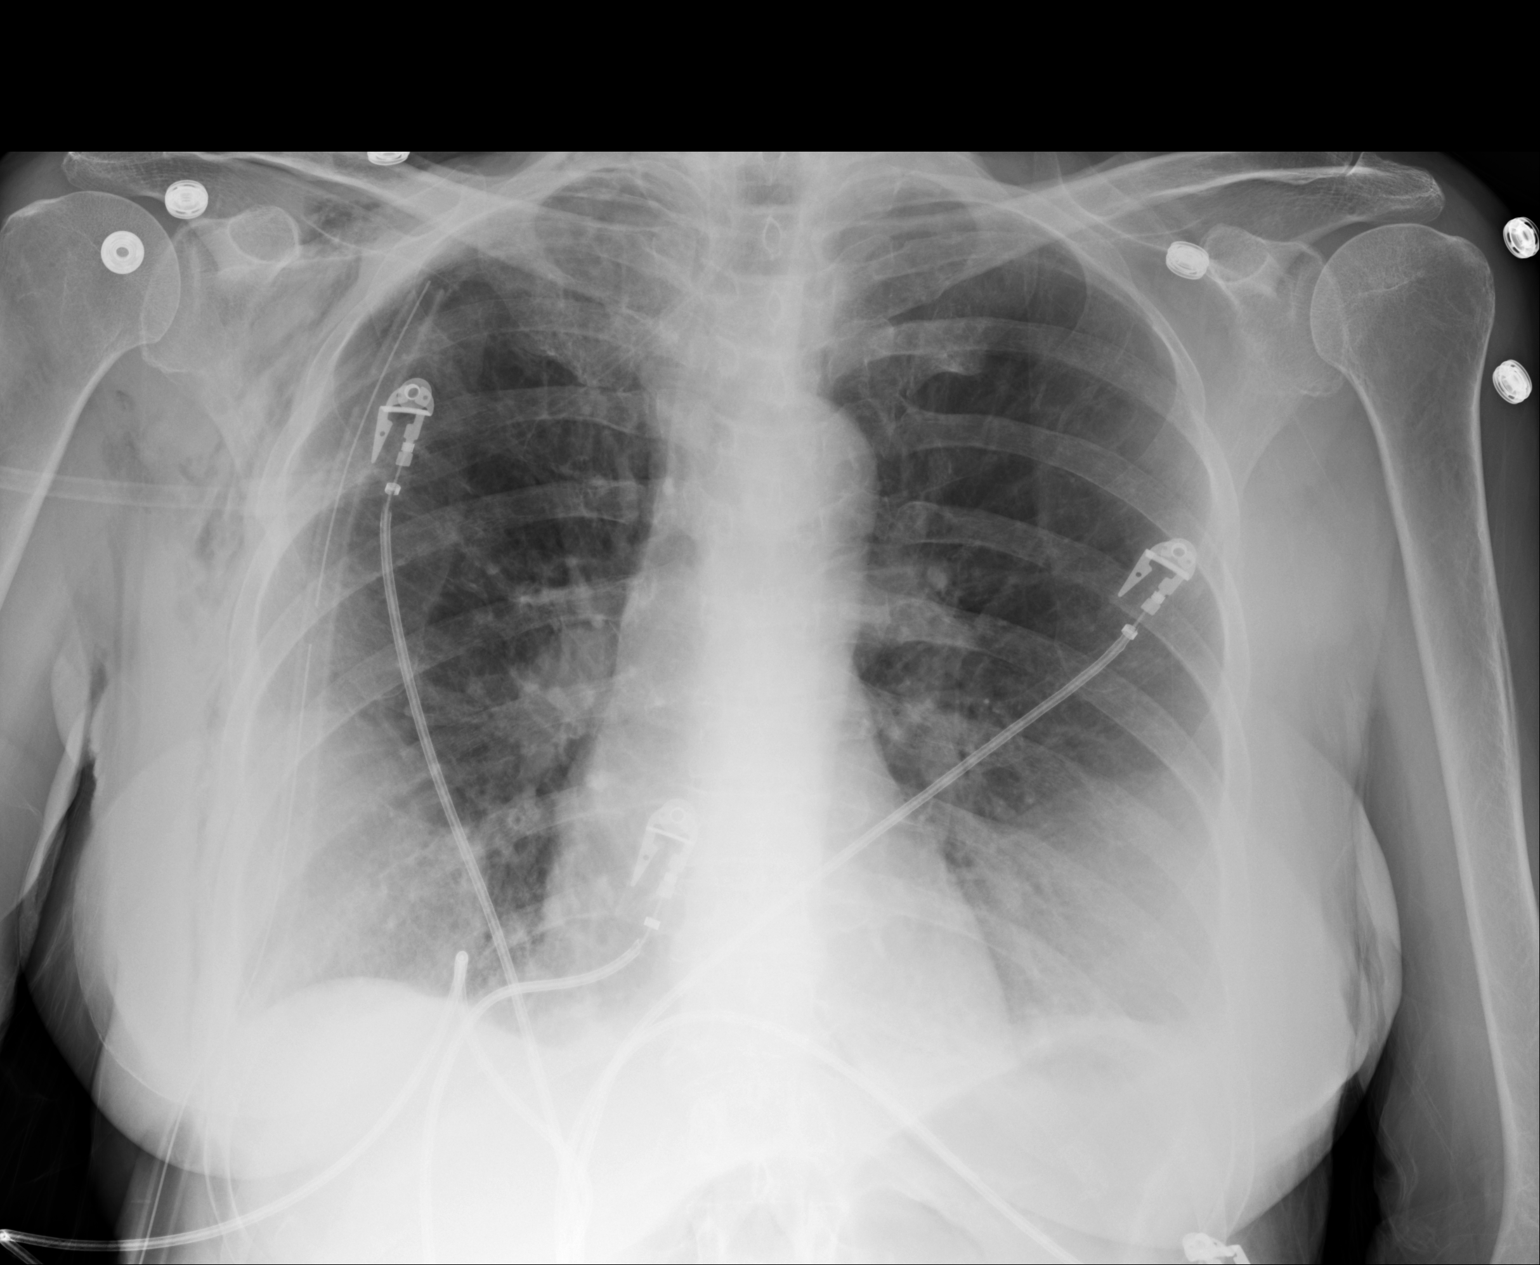

[2 of 2 positions shown; findings below may reference images not displayed]

PROCEDURE:     DXR - DXR CHEST PA (OR AP) AND LATERAL  - May 11, 2012 [DATE]

RESULT:     Comparison is made to the study 10 May, 2012. There is
persistent left pleural effusion. Right-sided chest tube is present with tip
laterally near the right lung apex. There is persistent subcutaneous
emphysema of the upper lateral and subclavicular regions of the right
hemithorax. Minimal apical pneumothorax is present and unchanged. Cardiac
monitoring electrodes are present. Cardiac silhouette is normal. No
significant pleural effusion is present on the right. There is a minimal
increased density along the inferior lateral aspect of the right lung could
represent contusion.
IMPRESSION: 1. Persistent minimal right apical pneumothorax. Trace left pleural
effusion. Basilar atelectasis versus contusion.

[REDACTED]

## 2013-08-22 MED ORDER — DOXYCYCLINE HYCLATE 100 MG PO TABS: 100.0000 mg | ORAL_TABLET | Freq: Two times a day (BID) | ORAL | Status: DC

## 2013-08-22 NOTE — Patient Instructions (Signed)
Start the antibiotics today.  If you get more short of breath then go to the ER.  Keep using your inhalers in the meantime.  When you are feeling better, then notify me and we'll set up imaging for your abdominal wall.  Take care.

## 2013-08-22 NOTE — Progress Notes (Signed)
Pre visit review using our clinic review tool, if applicable. No additional management support is needed unless otherwise documented below in the visit note.  Sx started about 2 days ago.  Has yellow sputum with cough. No fevers.  More SOB, more than typical.  Voice is hoarse.  Walking triggers the cough.  No ear pain.  +ST.  No rhinorrhea.  Sputum production seems to come from the chest, not just from clearing her throat.  No myalgias.  No vomiting, no diarrhea.  More wheeze than typical, dec for ~3.5 hours with SABA use.   R lower anterior chest wall/upper anterior abd wall swelling. Noted for about 2 years, getting bigger slightly since her implant rupture, ie months ago.  Sore locally.    Meds, vitals, and allergies reviewed.   ROS: See HPI.  Otherwise, noncontributory.  nad Thin female OP wnl Neck supple, no LA rrr Global dec in BS B Speaking in complete sentences No wheeze No focal dec in BS abd soft Some tender soft tissue prominence/swelling in the RUQ abd wall, inferior to prev breast surgery site.

## 2013-08-23 ENCOUNTER — Telehealth: Payer: Self-pay

## 2013-08-23 IMAGING — CR DG CHEST 2V
1 series · 2 of 2 positions shown · non-contrast
Comparison: none

REASON FOR EXAM: PTX
COMMENTS:

[Series 1: x chest ap · 0.14mm/px · 2 of 2 slices shown]
[im 1/2]
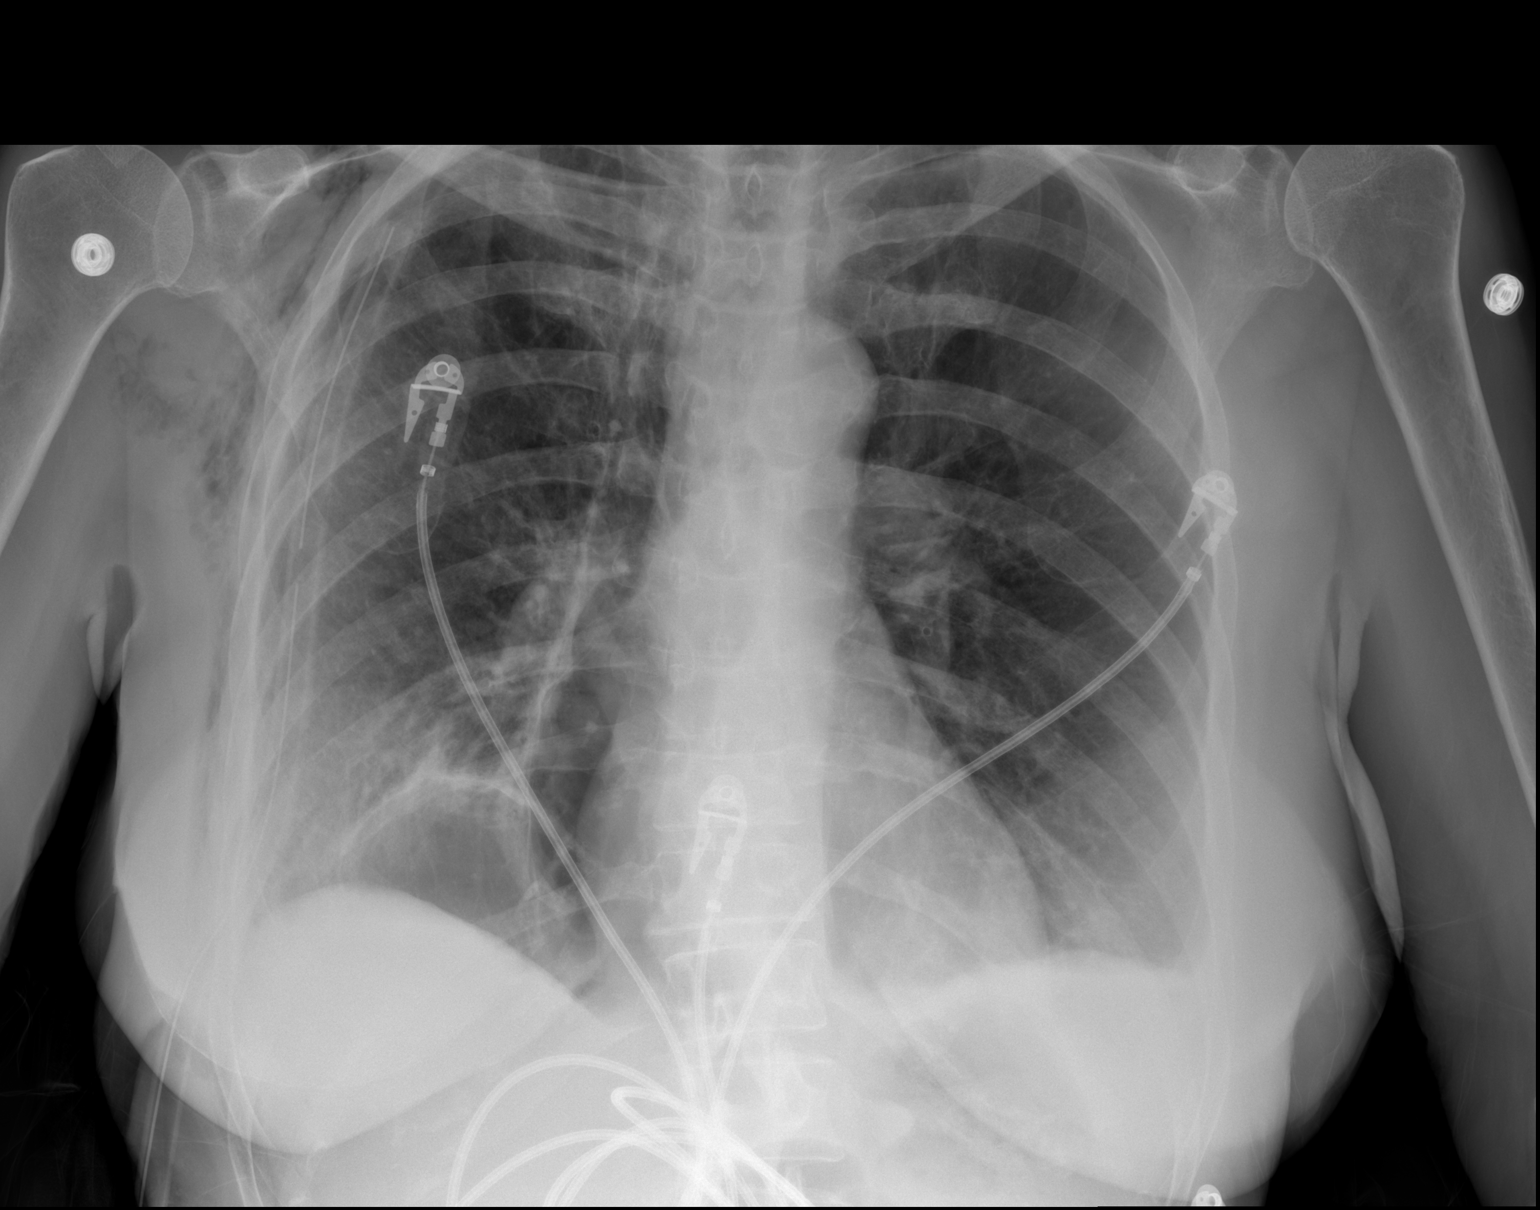
[im 2/2]
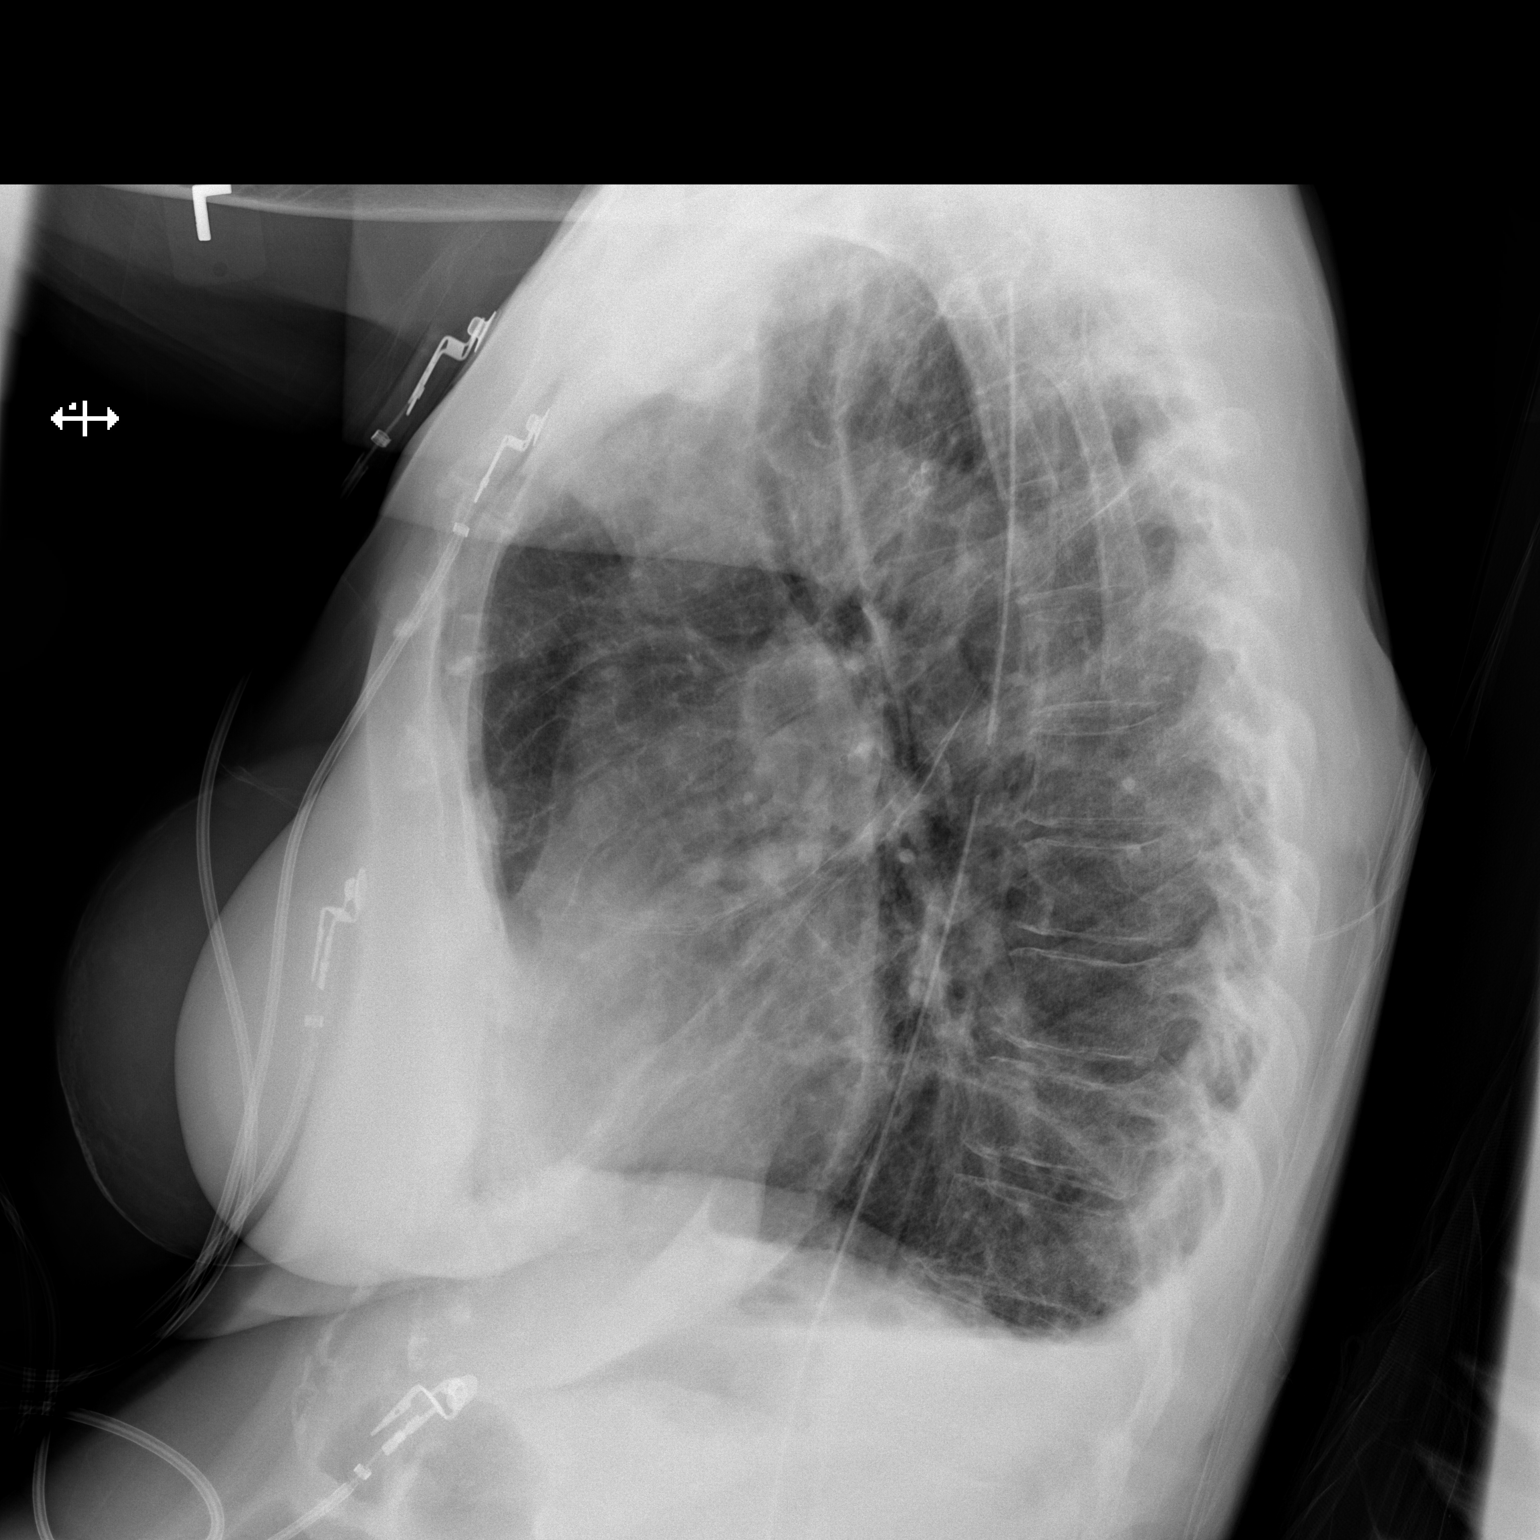

[2 of 2 positions shown; findings below may reference images not displayed]

PROCEDURE:     DXR - DXR CHEST PA (OR AP) AND LATERAL  - May 12, 2012  [DATE]

RESULT:     Comparison made to prior study 05/11/2012. Right chest tube.
Subcutaneous emphysema. Pleural thickening on the right is noted.
Pneumothorax along the right base appears to be present. Lungs are clear of
infiltrates. Heart size normal.
IMPRESSION: Chest tube in stable position. There appears to be free
pleural air along the right base suggesting pneumothorax in this region.
Subcutaneous emphysema.

## 2013-08-23 NOTE — Assessment & Plan Note (Signed)
Presumed, wouldn't start oral steroids with lack of wheeze on exam.  Continue inhalers.  Start doxy.  If more SOB, to ER.  She doesn't appear to need ER eval this point and she agrees with plan.

## 2013-08-23 NOTE — Assessment & Plan Note (Signed)
We can image this after she feels better from a resp stand point.  This isn't emergent.  Likely localized to the soft tissue of the abd wall.  No intraabdominal sx. D/w pt.  She'll call back and we'll proceed.

## 2013-08-23 NOTE — Telephone Encounter (Signed)
Amy with home health left v/m; Amy was seeing pt for last [facility] visit and pt has fever today of 100.1. Pt was seen on 08/22/13 with cough and S/T. Amy wants to know if pt needs f/u appt with Dr Damita Dunnings or does Dr Damita Dunnings want Amy to get CXR for pt. Amy request cb.

## 2013-08-23 NOTE — Telephone Encounter (Signed)
100.1 doesn't qualify as a fever but that isn't the main point here.  If she is clinically worsening, then she shouldn't wait on [place] CXR and should go to ER.  If she is improving, no CXR is needed.  If she is not better or worse than yesterday, then it is reasonable to get the CXR done today.   Please give there verbal order if needed. Thanks.

## 2013-08-23 NOTE — Telephone Encounter (Signed)
If she feels some better, then I wouldn't get the CXR.  We can always see her back in the office as needed.  If she doesn't continue to improve, then notify us.  Thanks.

## 2013-08-23 NOTE — Telephone Encounter (Signed)
Mckenzie Mclaughlin notified as instructed by telephone. Was advised by Mckenzie Mclaughlin that patient told her that she felt some better today, but was concerned about the low grade fever. Mckenzie Mclaughlin stated that her lungs sound okay and not any different from previous checks. Mckenzie Mclaughlin stated that she is discharging her from care today.

## 2013-08-23 NOTE — Telephone Encounter (Signed)
Patient notified as instructed by telephone. Patient stated that she will call the office back if she does not continue to improve.

## 2013-08-24 IMAGING — CR DG CHEST 2V
1 series · 2 of 2 positions shown · non-contrast
Comparison: none

REASON FOR EXAM: assess for plueral effsuion
COMMENTS:

[Series 3: x chest ap · 0.14mm/px · 2 of 2 slices shown]
[im 1/2]
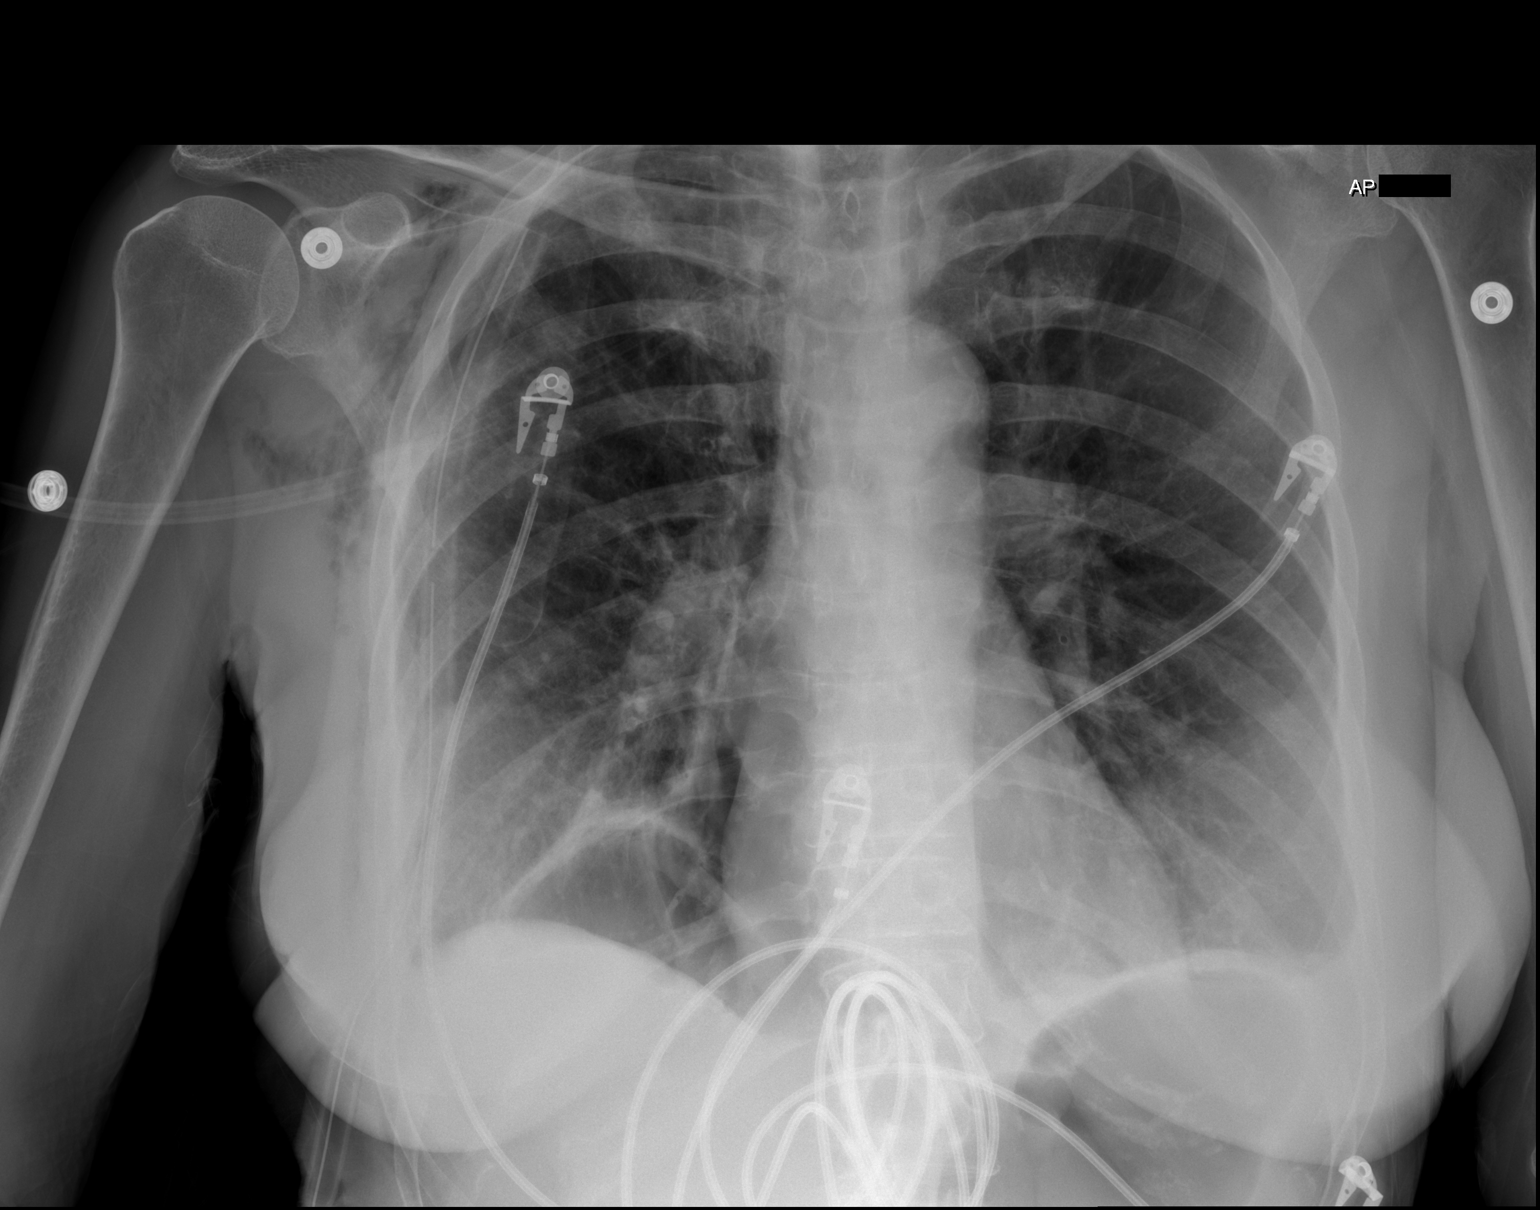
[im 2/2]
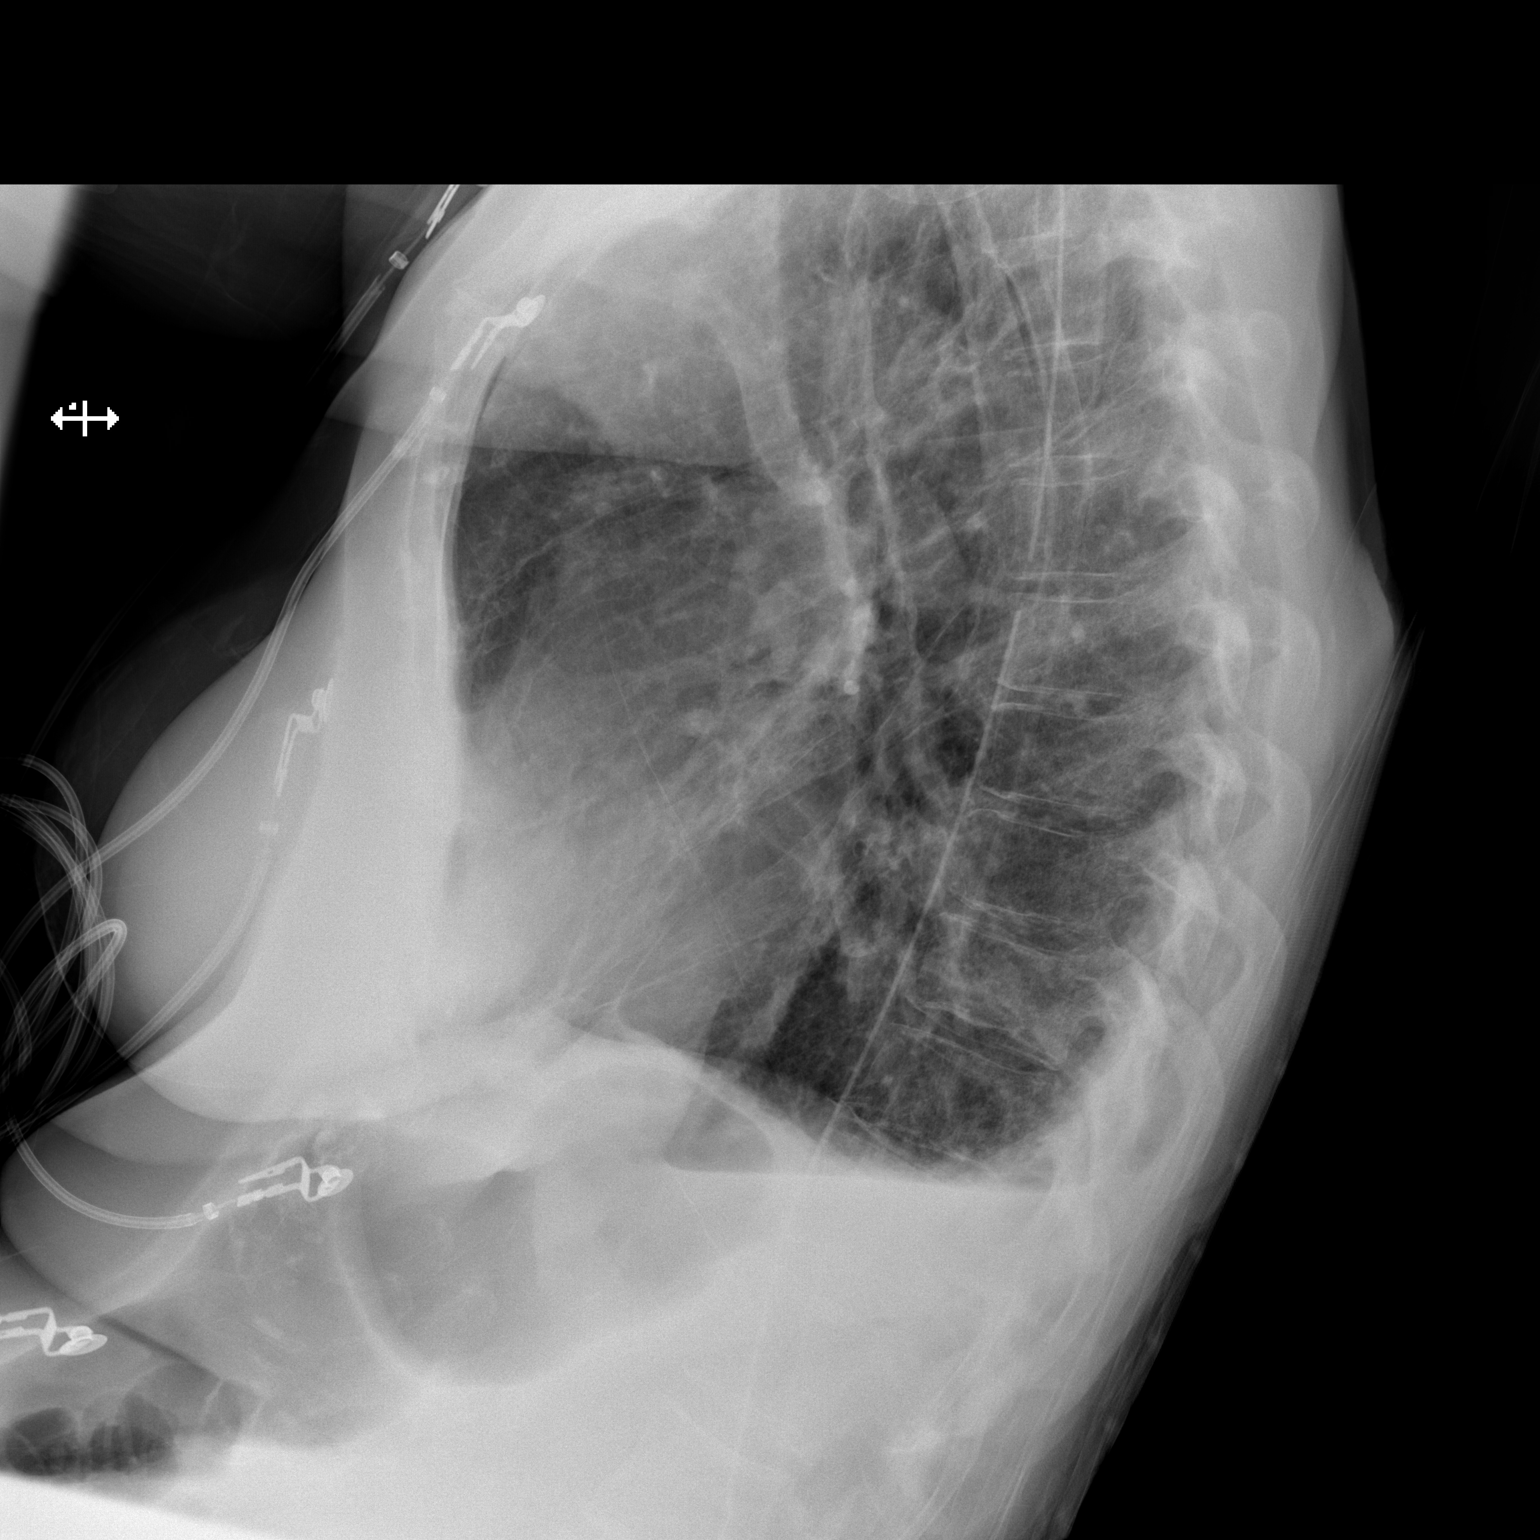

[2 of 2 positions shown; findings below may reference images not displayed]

PROCEDURE:     DXR - DXR CHEST PA (OR AP) AND LATERAL  - May 13, 2012 [DATE]

RESULT:     Comparison is made to the study 12 May, 2012.

There is persistent subcutaneous emphysema over the right axillary and
supraclavicular regions. Right chest tube remains in place. There is trace
fluid at the lung bases on the lateral view is blunting predominantly of the
left costophrenic angle. The cardiac silhouette is normal. There is some
atelectasis or fibrosis at the right lung base. There is mild compression
deformity of a lower thoracic vertebral body. No significant pneumothorax is
appreciated.
IMPRESSION: Over one chest tube remains in place on the right. Trace
pleural fluid present. No significant pneumothorax. Persistent right-sided
subcutaneous emphysema.

[REDACTED]

## 2013-08-25 DIAGNOSIS — R079 Chest pain, unspecified: Secondary | ICD-10-CM | POA: Insufficient documentation

## 2013-08-25 IMAGING — CR DG CHEST 2V
1 series · 2 of 2 positions shown · non-contrast
Comparison: none

REASON FOR EXAM: PTX
COMMENTS:

PROCEDURE:     DXR - DXR CHEST PA (OR AP) AND LATERAL  - May 14, 2012 [DATE]
RESULT:
Comparison is made to prior study dated 05/13/2012.

[Series 5: x chest ap · 0.14mm/px · 2 of 2 slices shown]
[im 1/2]
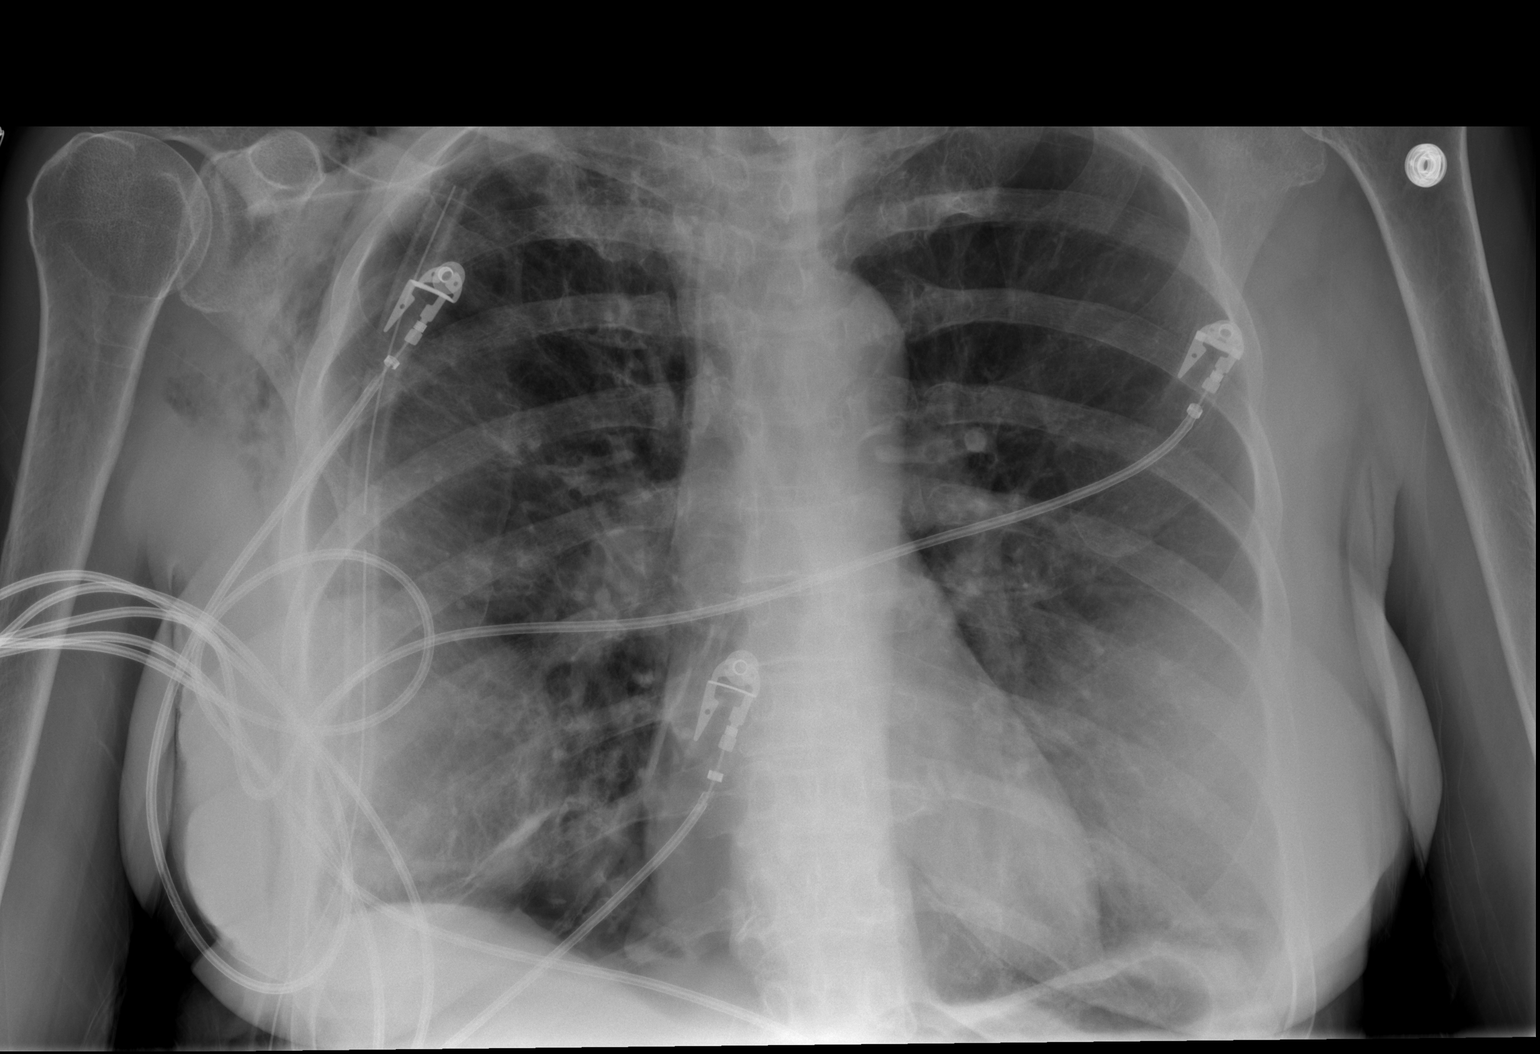
[im 2/2]
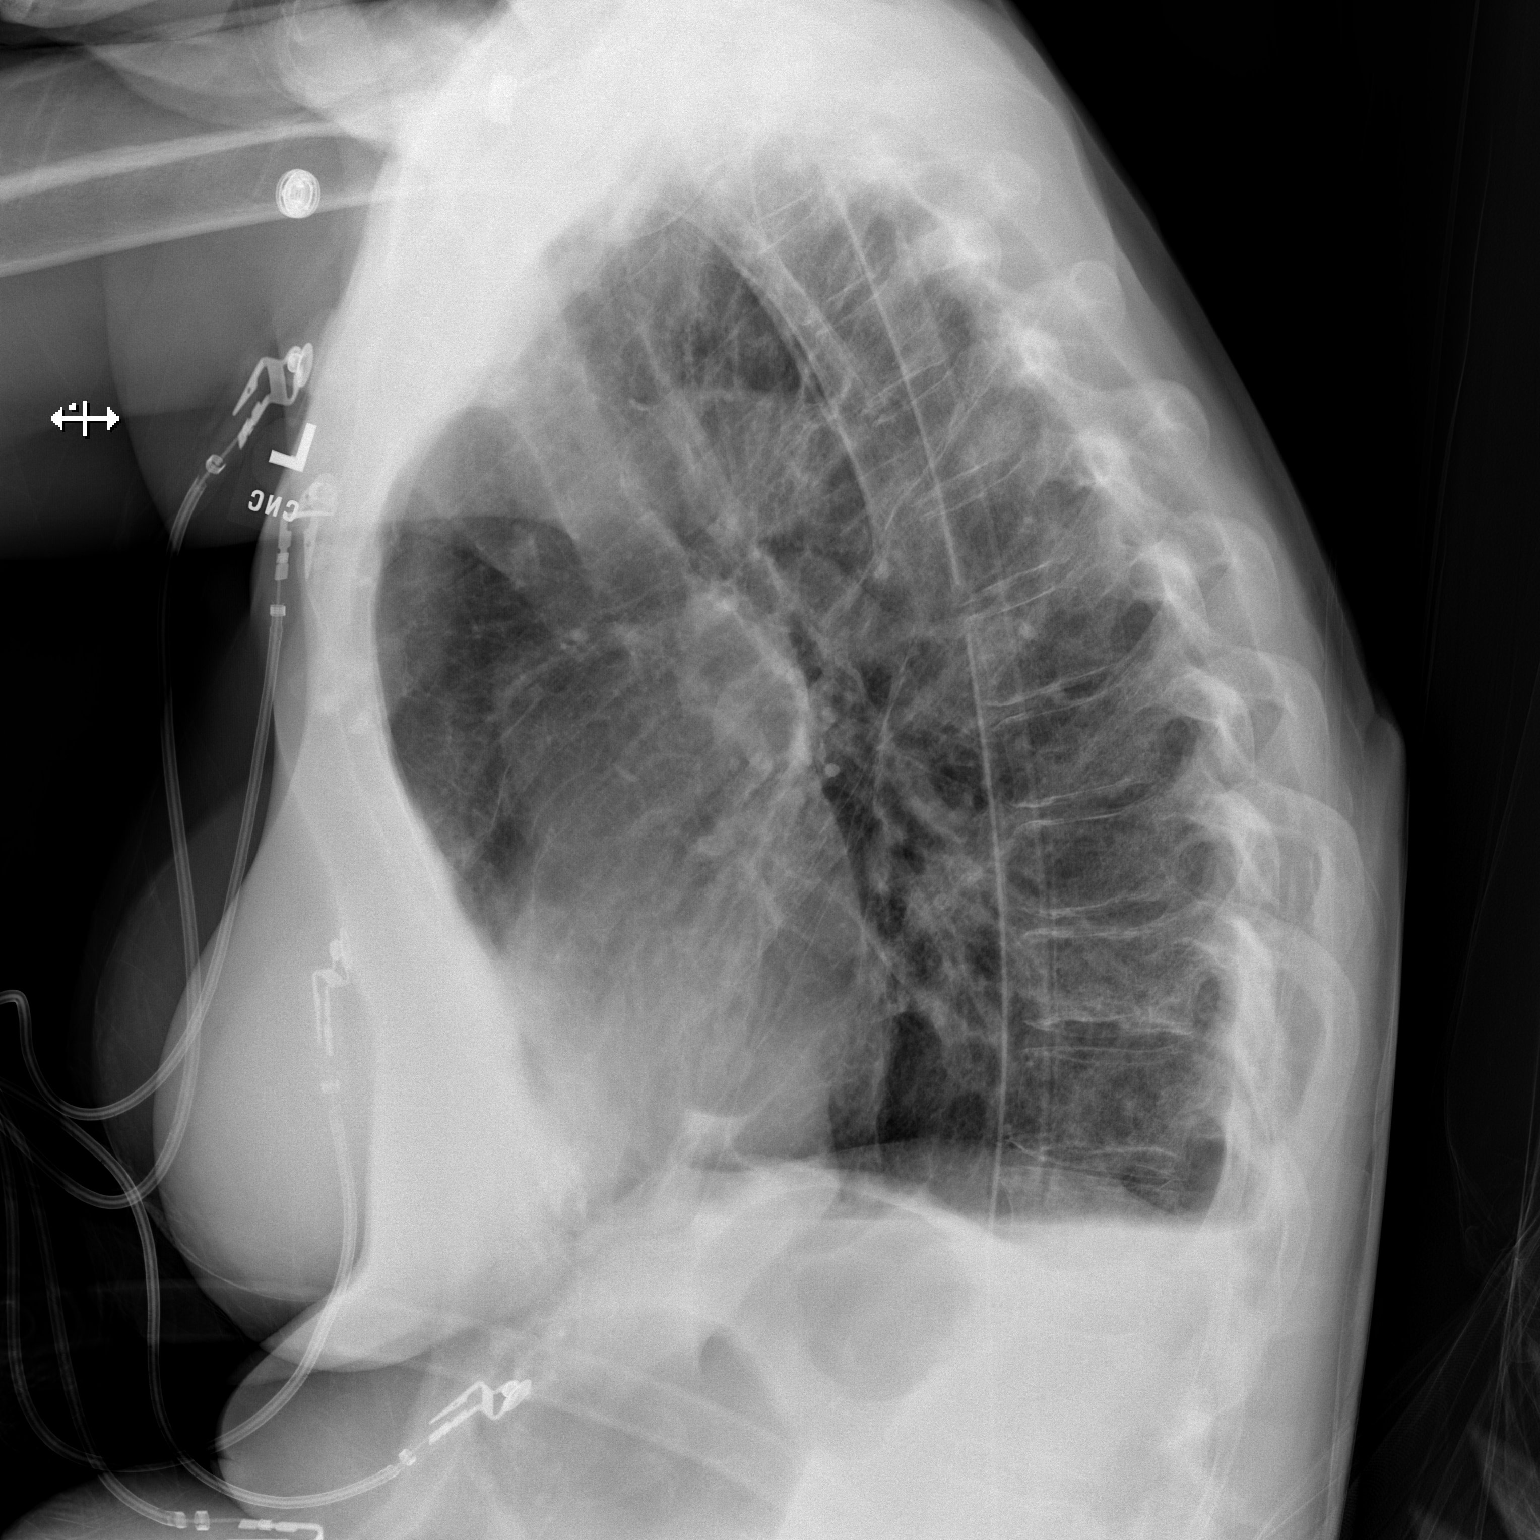

[2 of 2 positions shown; findings below may reference images not displayed]

FINDINGS: There is flattening of the hemidiaphragms and increased AP
diameter of the chest. A right-sided chest tube is appreciated with tip at
the level of the lung apex. There is no evidence of an appreciable
pneumothorax. Subcutaneous emphysema is identified within the lateral chest
wall. There is trace blunting of the right costophrenic angle. Prominence of
the interstitial markings is appreciated. No focal regions of consolidation
are identified. The cardiac silhouette and visualized bony skeleton are
unremarkable.
IMPRESSION: 1. No evidence of an appreciable pneumothorax.
2. Minimal blunting of the right costophrenic angle a component of which may
represent scarring. Otherwise, no appreciable pleural effusion is identified.
3. Interstitial findings likely representing pulmonary fibrosis.
4. Not much above bilateral breast implants are identified.

## 2013-08-26 IMAGING — CR DG CHEST 2V
1 series · 3 of 3 positions shown · non-contrast
Comparison: none

REASON FOR EXAM: PTX
COMMENTS:

[Series 1: ap · 0.17mm/px · 3 of 3 slices shown]
[im 1/3]
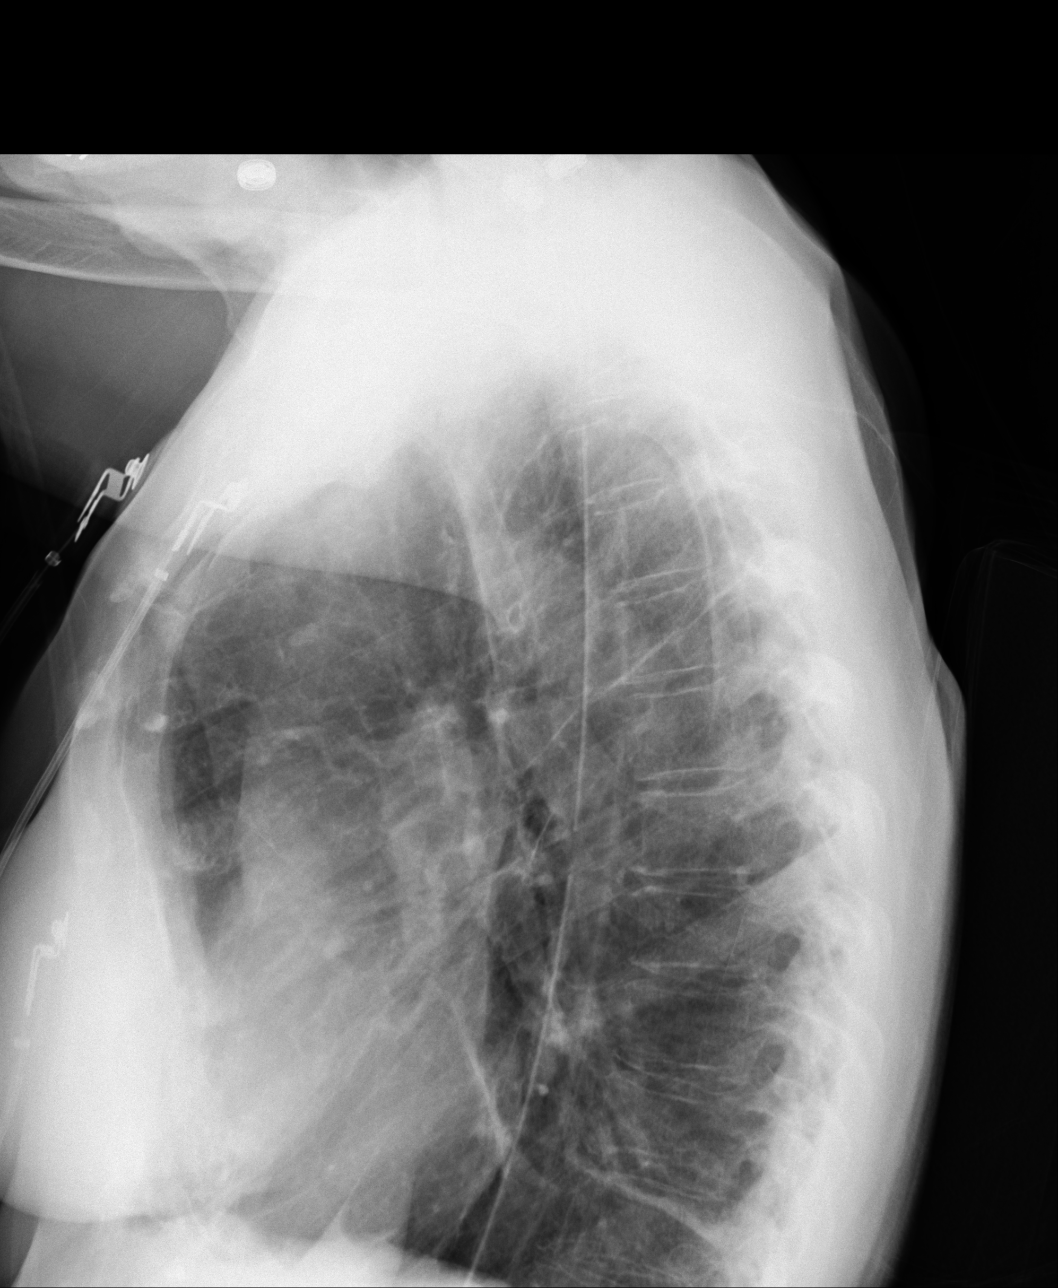
[im 2/3]
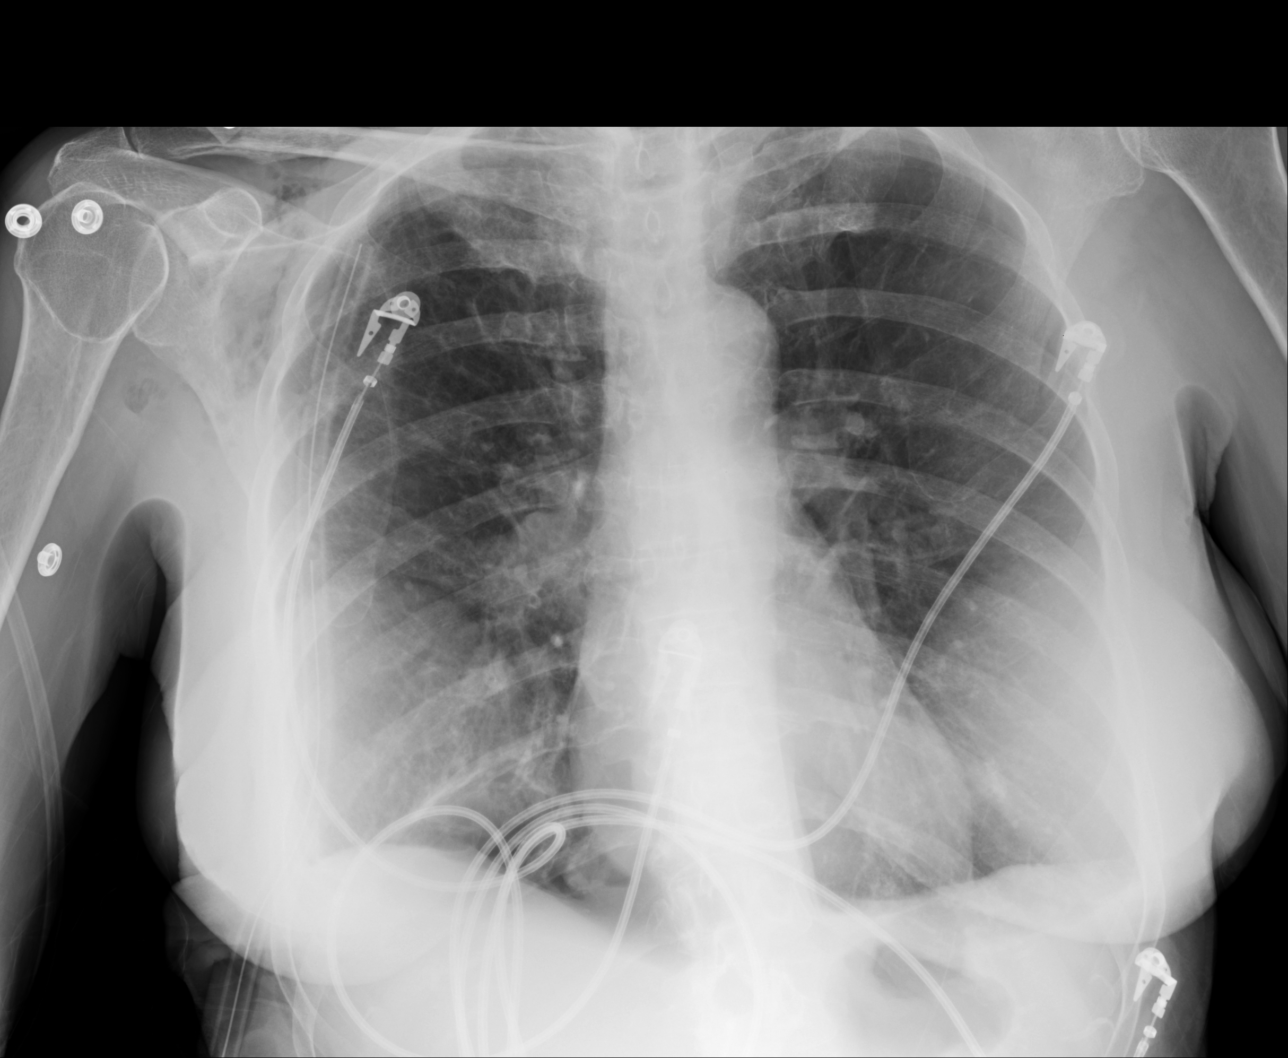
[im 3/3]
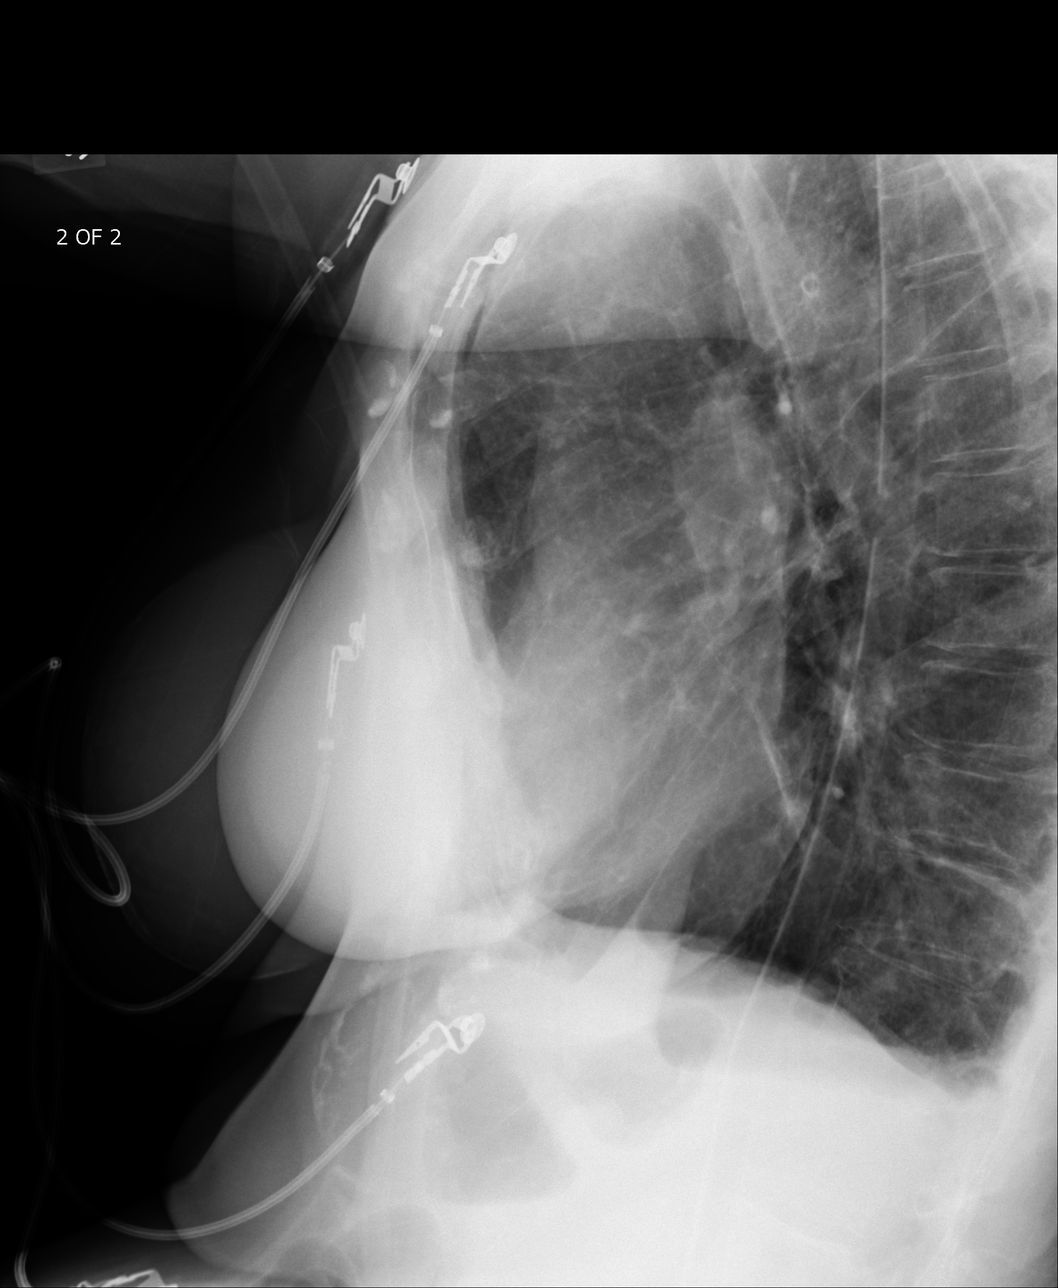

[3 of 3 positions shown; findings below may reference images not displayed]

PROCEDURE:     DXR - DXR CHEST PA (OR AP) AND LATERAL  - May 15, 2012  [DATE]

RESULT:     Comparison is made to a prior study dated 05/14/2012.

A right-sided chest tube is appreciated with tip projecting in the region of
the right lung apex. There is no evidence of an appreciable pneumothorax.
There is flattening of the hemidiaphragms. No focal regions of consolidation
or focal infiltrates are appreciated. The patient is status post bilateral
breast implants.
IMPRESSION: 1. COPD without evidence of acute cardiopulmonary disease.
2. Right-sided chest tube as described above. There does not appear to be
evidence of an appreciable pneumothorax. There is decrease in the amount of
subcutaneous emphysema in the right axilla.

## 2013-09-01 ENCOUNTER — Ambulatory Visit (INDEPENDENT_AMBULATORY_CARE_PROVIDER_SITE_OTHER): Payer: Medicare Other | Admitting: Internal Medicine

## 2013-09-01 ENCOUNTER — Encounter: Payer: Self-pay | Admitting: Internal Medicine

## 2013-09-01 VITALS — BP 140/78 | HR 78 | Temp 97.5°F | Wt 162.0 lb

## 2013-09-01 DIAGNOSIS — S39011A Strain of muscle, fascia and tendon of abdomen, initial encounter: Secondary | ICD-10-CM

## 2013-09-01 DIAGNOSIS — M19049 Primary osteoarthritis, unspecified hand: Secondary | ICD-10-CM

## 2013-09-01 DIAGNOSIS — J441 Chronic obstructive pulmonary disease with (acute) exacerbation: Secondary | ICD-10-CM

## 2013-09-01 DIAGNOSIS — IMO0002 Reserved for concepts with insufficient information to code with codable children: Secondary | ICD-10-CM

## 2013-09-01 MED ORDER — AZITHROMYCIN 250 MG PO TABS: ORAL_TABLET | ORAL | Status: DC

## 2013-09-01 NOTE — Progress Notes (Signed)
Pre visit review using our clinic review tool, if applicable. No additional management support is needed unless otherwise documented below in the visit note. 

## 2013-09-01 NOTE — Patient Instructions (Addendum)
Chronic Obstructive Pulmonary Disease  Chronic obstructive pulmonary disease (COPD) is a common lung condition in which airflow from the lungs is limited. COPD is a general term that can be used to describe many different lung problems that limit airflow, including both chronic bronchitis and emphysema.  If you have COPD, your lung function will probably never return to normal, but there are measures you can take to improve lung function and make yourself feel better.   CAUSES   · Smoking (common).    · Exposure to secondhand smoke.    · Genetic problems.  · Chronic inflammatory lung diseases or recurrent infections.  SYMPTOMS   · Shortness of breath, especially with physical activity.    · Deep, persistent (chronic) cough with a large amount of thick mucus.    · Wheezing.    · Rapid breaths (tachypnea).    · Gray or bluish discoloration (cyanosis) of the skin, especially in fingers, toes, or lips.    · Fatigue.    · Weight loss.    · Frequent infections or episodes when breathing symptoms become much worse (exacerbations).    · Chest tightness.  DIAGNOSIS   Your healthcare provider will take a medical history and perform a physical examination to make the initial diagnosis.  Additional tests for COPD may include:   · Lung (pulmonary) function tests.  · Chest X-ray.  · CT scan.  · Blood tests.  TREATMENT   Treatment available to help you feel better when you have COPD include:   · Inhaler and nebulizer medicines. These help manage the symptoms of COPD and make your breathing more comfortable  · Supplemental oxygen. Supplemental oxygen is only helpful if you have a low oxygen level in your blood.    · Exercise and physical activity. These are beneficial for nearly all people with COPD. Some people may also benefit from a pulmonary rehabilitation program.  HOME CARE INSTRUCTIONS   · Take all medicines (inhaled or pills) as directed by your health care provider.  · Only take over-the-counter or prescription medicines  for pain, fever, or discomfort as directed by your health care provider.    · Avoid over-the-counter medicines or cough syrups that dry up your airway (such as antihistamines) and slow down the elimination of secretions unless instructed otherwise by your healthcare provider.    · If you are a smoker, the most important thing that you can do is stop smoking. Continuing to smoke will cause further lung damage and breathing trouble. Ask your health care provider for help with quitting smoking. He or she can direct you to community resources or hospitals that provide support.  · Avoid exposure to irritants such as smoke, chemicals, and fumes that aggravate your breathing.  · Use oxygen therapy and pulmonary rehabilitation if directed by your health care provider. If you require home oxygen therapy, ask your healthcare provider whether you should purchase a pulse oximeter to measure your oxygen level at home.    · Avoid contact with individuals who have a contagious illness.  · Avoid extreme temperature and humidity changes.  · Eat healthy foods. Eating smaller, more frequent meals and resting before meals may help you maintain your strength.  · Stay active, but balance activity with periods of rest. Exercise and physical activity will help you maintain your ability to do things you want to do.  · Preventing infection and hospitalization is very important when you have COPD. Make sure to receive all the vaccines your health care provider recommends, especially the pneumococcal and influenza vaccines. Ask your healthcare provider whether you   need a pneumonia vaccine.  · Learn and use relaxation techniques to manage stress.  · Learn and use controlled breathing techniques as directed by your health care provider. Controlled breathing techniques include:    · Pursed lip breathing. Start by breathing in (inhaling) through your nose for 1 second. Then, purse your lips as if you were going to whistle and breathe out (exhale)  through the pursed lips for 2 seconds.    · Diaphragmatic breathing. Start by putting one hand on your abdomen just above your waist. Inhale slowly through your nose. The hand on your abdomen should move out. Then purse your lips and exhale slowly. You should be able to feel the hand on your abdomen moving in as you exhale.    · Learn and use controlled coughing to clear mucus from your lungs. Controlled coughing is a series of short, progressive coughs. The steps of controlled coughing are:    1. Lean your head slightly forward.    2. Breathe in deeply using diaphragmatic breathing.    3. Try to hold your breath for 3 seconds.    4. Keep your mouth slightly open while coughing twice.    5. Spit any mucus out into a tissue.    6. Rest and repeat the steps once or twice as needed.  SEEK MEDICAL CARE IF:   · You are coughing up more mucus than usual.    · There is a change in the color or thickness of your mucus.    · Your breathing is more labored than usual.    · Your breathing is faster than usual.    SEEK IMMEDIATE MEDICAL CARE IF:   · You have shortness of breath while you are resting.    · You have shortness of breath that prevents you from:  · Being able to talk.    · Performing your usual physical activities.    · You have chest pain lasting longer than 5 minutes.    · Your skin color is more cyanotic than usual.  · You measure low oxygen saturations for longer than 5 minutes with a pulse oximeter.  MAKE SURE YOU:   · Understand these instructions.  · Will watch your condition.  · Will get help right away if you are not doing well or get worse.  Document Released: 12/24/2004 Document Revised: 01/04/2013 Document Reviewed: 11/10/2012  [employer]® Patient Information ©2014 [employer].

## 2013-09-01 NOTE — Progress Notes (Signed)
Subjective:    Patient ID: Mckenzie Mclaughlin, female    DOB: January 26, 1941, 73 y.o.   MRN: [id number]  HPI  Pt presents to the clinic today with c/o cough. She reports this started about 2 months ago. The cough is productive of thick beige colored sputum. She denies fever, chills or body aches. She does have a history of COPD. She is on albuterol, symbicort and spiriva. She has oxygen to use prn. She was seen for similar complaints 08/22/13. She was diagnosed with COPD exacerbation. Dr. Damita Dunnings put her on doxycycline. She only took the antibiotics for 2-3 days because it did cause her some nausea and dizziness.  Additionally, she c/o flank pain on the left side. This started 5 days ago. The pain radiates to her pelvic area at times. She is unable to sleep on that side due to the pain. She denies urgency, frequency, dysuria, fever, chills or low back pain. She thinks it is from cough so hard. She has not tried anything OTC.  Additionally, she would like me to take a look at her left middle finger. She c/o swelling and stiffness in the joint. She has not noticed any warmth or redness. She thinks it may be arthritis. She has not tried anything OTC.   Review of Systems      Past Medical History  Diagnosis Date  . COPD, severe 07/29/98  . Coronary artery disease 01/22/99    Non Q-wave MI, stent RCA  . Hypertension     03/30/00  . History of ETT 08/03/09    Myoview Normal  . History of ETT 05/1999    Cardiolite pos ETT, neg Cardiolite  . Hx of cardiac catheterization 08/29/01    60% RCA 0/w 20-30% lesions  . Hx of cardiac catheterization 01/22/99    w/Stent 90% RCA lesion  . History of ETT 09/02/01    Normal  . History of ETT 04/28/2006    Myoview normal EF 83%  . History of CT scan of head 08/02/09    w/o mild age appropr atrophy  . History of blurry vision 05/06-05/10/2009    Hospital [facility] CP R/O'D Blurry vision, smoking  . History of ETT 04/10/11    normal EF and no ischemia per Dr.  Saralyn Pilar  . COPD (chronic obstructive pulmonary disease)   . Anxiety   . Shortness of breath   . Pneumothorax 2/14  . Esophageal dilatation     Pt needs appple sauce to take meds.  . Esophageal stricture     PT needs apple sauce to take meds  . GERD (gastroesophageal reflux disease) 08/29/98    severe-no meds now  . On home oxygen therapy     as needed-has a travel tank  . Complication of anesthesia   . PONV (postoperative nausea and vomiting)     ad breast remocved and see was under anesthesia a long time  . Anginal pain     see dr [name]  "Spams"  . Dysrhythmia     Palpations at times. last time 10/29 ish 2014  . Mental disorder   . Depression   . Head injury, acute, with loss of consciousness 11/2012    unsure how long  . Constipation   . Cancer     side of head- skin cancer, squamous cell ca on left foot.  Marland Kitchen History of blood transfusion     Current Outpatient Prescriptions  Medication Sig Dispense Refill  . albuterol (PROVENTIL HFA;VENTOLIN HFA) 108 (90  BASE) MCG/ACT inhaler Inhale 2 puffs into the lungs 4 (four) times daily as needed for wheezing or shortness of breath.      . ALPRAZolam (XANAX) 0.5 MG tablet Take 1 tablet (0.5 mg total) by mouth 3 (three) times daily as needed for anxiety.  90 tablet  3  . aspirin 81 MG tablet Take 81 mg by mouth daily.      . budesonide-formoterol (SYMBICORT) 160-4.5 MCG/ACT inhaler Inhale 2 puffs into the lungs 2 (two) times daily.      . metoprolol succinate (TOPROL-XL) 50 MG 24 hr tablet Take 1 tablet (50 mg total) by mouth daily. Take with or immediately following a meal.  30 tablet  12  . simvastatin (ZOCOR) 10 MG tablet Take 10 mg by mouth at bedtime.      Marland Kitchen tiotropium (SPIRIVA HANDIHALER) 18 MCG inhalation capsule Place 18 mcg into inhaler and inhale at bedtime.       Marland Kitchen venlafaxine (EFFEXOR) 37.5 MG tablet Take 1 tablet (37.5 mg total) by mouth 2 (two) times daily.      . [DISCONTINUED] clonazePAM (KLONOPIN) 0.5 MG tablet  Take 0.5 tablets (0.25 mg total) by mouth 2 (two) times daily as needed for anxiety.  30 tablet  0   No current facility-administered medications for this visit.    Allergies  Allergen Reactions  . Amoxicillin Other (See Comments)    Shortness of breath.  . Doxycycline Nausea Only and Other (See Comments)    Dizzy  . Sertraline Hcl     REACTION: trembling    Family History  Problem Relation Age of Onset  . Stroke Mother   . Heart disease Mother     MI  . Cancer Father     Lung  . COPD Brother   . Cancer Brother     Lung with mets 2011  . Heart disease Brother     CAD  . Heart disease Sister     cirrhosis due heart disease  . Cirrhosis Sister   . Colon cancer Neg Hx   . Breast cancer Neg Hx     History   Social History  . Marital Status: Married    Spouse Name: N/A    Number of Children: 0  . Years of Education: N/A   Occupational History  . Retired/Disability     Teacher, English as a foreign language [employer]   Social History Main Topics  . Smoking status: Former Smoker -- 0.40 packs/day for 50 years    Types: Cigarettes    Quit date: 02/28/2012  . Smokeless tobacco: Never Used     Comment: 1/4 PPD as of 2012  . Alcohol Use: No  . Drug Use: No  . Sexual Activity: Not on file   Other Topics Concern  . Not on file   Social History Narrative   Retired Disability 2004 Lung disease   Married 1962   Prev rode her [place] with husband     Constitutional: Denies fever, malaise, fatigue, headache or abrupt weight changes.  Respiratory: Pt reports cough and shortness of breath. Denies difficulty breathing. Cardiovascular: Denies chest pain, chest tightness, palpitations or swelling in the hands or feet.  Gastrointestinal: Pt reports left flank pain. Denies  bloating, constipation, diarrhea or blood in the stool.  GU: Denies urgency, frequency, pain with urination, burning sensation, blood in urine, odor or discharge. Musculoskeletal: Pt reports joint  pain and swelling. Denies decrease in range of motion, difficulty with gait.   No other specific  complaints in a complete review of systems (except as listed in HPI above).  Objective:   Physical Exam  BP 140/78  Pulse 78  Temp(Src) 97.5 F (36.4 C) (Oral)  Wt 162 lb (73.483 kg)  SpO2 98% Wt Readings from Last 3 Encounters:  09/01/13 162 lb (73.483 kg)  08/22/13 160 lb 4 oz (72.689 kg)  07/07/13 158 lb 8 oz (71.895 kg)    General: Appears her stated age, chronically ill appearing in NAD. Cardiovascular: Normal rate and rhythm. S1,S2 noted.  No murmur, rubs or gallops noted. No JVD or BLE edema. No carotid bruits noted. Pulmonary/Chest: Normal effort and scattered rhonchi noted, most prominent in LUL. No respiratory distress. No wheezes, rales noted.  Abdomen: Soft and nontender. Normal bowel sounds, no bruits noted. No distention or masses noted. Liver, spleen and kidneys non palpable. Musculoskeletal: Pain with palpation of the LUQ of the abdomen/left flank. No CVA tenderness. Bouchards node noted on the left middle finger with mild swelling. No warmth noted.    BMET    Component Value Date/Time   NA 138 05/17/2013 0545   K 4.2 05/17/2013 0545   CL 101 05/17/2013 0545   CO2 30 05/17/2013 0545   GLUCOSE 109* 05/17/2013 0545   BUN 17 05/17/2013 0545   CREATININE 0.57 05/17/2013 0545   CALCIUM 9.5 05/17/2013 0545   GFRNONAA >90 05/17/2013 0545   GFRAA >90 05/17/2013 0545    Lipid Panel     Component Value Date/Time   CHOL 136 07/11/2013 0940   TRIG 97.0 07/11/2013 0940   HDL 52.70 07/11/2013 0940   CHOLHDL 3 07/11/2013 0940   VLDL 19.4 07/11/2013 0940   LDLCALC 64 07/11/2013 0940    CBC    Component Value Date/Time   WBC 5.2 05/17/2013 0545   RBC 4.44 05/17/2013 0545   HGB 13.5 05/17/2013 0545   HCT 40.5 05/17/2013 0545   PLT 193 05/17/2013 0545   MCV 91.2 05/17/2013 0545   MCH 30.4 05/17/2013 0545   MCHC 33.3 05/17/2013 0545   RDW 12.7 05/17/2013 0545   LYMPHSABS 0.7 05/14/2013  0541   MONOABS 0.9 05/14/2013 0541   EOSABS 0.0 05/14/2013 0541   BASOSABS 0.0 05/14/2013 0541    Hgb A1C No results found for this basename: HGBA1C         Assessment & Plan:   COPD exacerbation:  Unable to take the doxycycline due to intolerance Lungs don't sound terrible today- no wheeze Will try azithromax x 5 days If no improvement, consider chest xray  LUQ/left flank pain, MKS in origin:  She is unable to take NSAIDs- she reports it makes her heart flutter Advised her to try tylenol and heating pad. If no improvement in 1 week, please call and let me know or follow up with PCP.  Arthritis, hand:  Advised her to try tylenol due to NSAID intolerance Ok to use ice if swelling is noted  RTC as needed or if symptoms persist or worsen

## 2013-09-03 IMAGING — CR DG CHEST 2V
1 series · 2 of 2 positions shown · non-contrast
Comparison: none

REASON FOR EXAM: post op cxr and chest tube f/u pneumothorax
COMMENTS:

PROCEDURE:     DXR - DXR CHEST PA (OR AP) AND LATERAL  - May 23, 2012  [DATE]
RESULT:     Comparison: 05/15/2012

[Series 1: ap · 0.17mm/px · 2 of 2 slices shown]
[im 1/2]
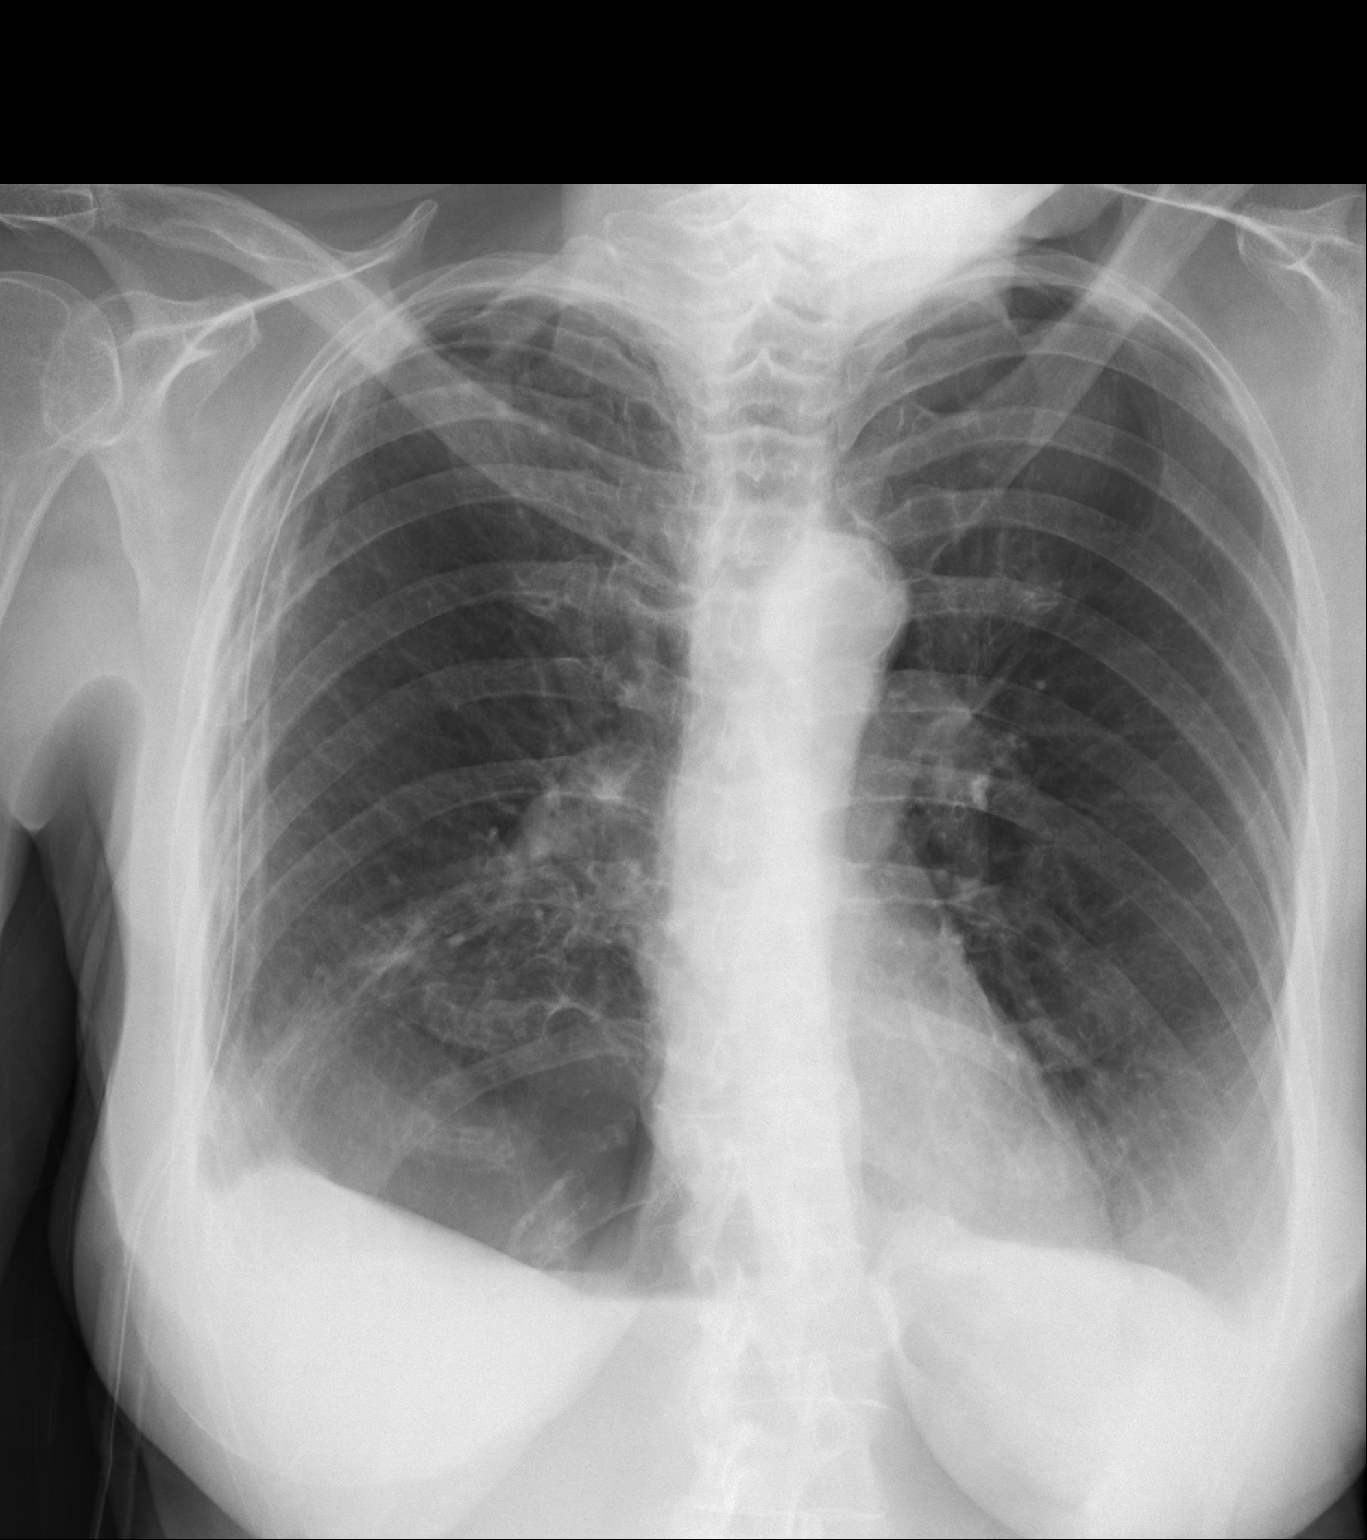
[im 2/2]
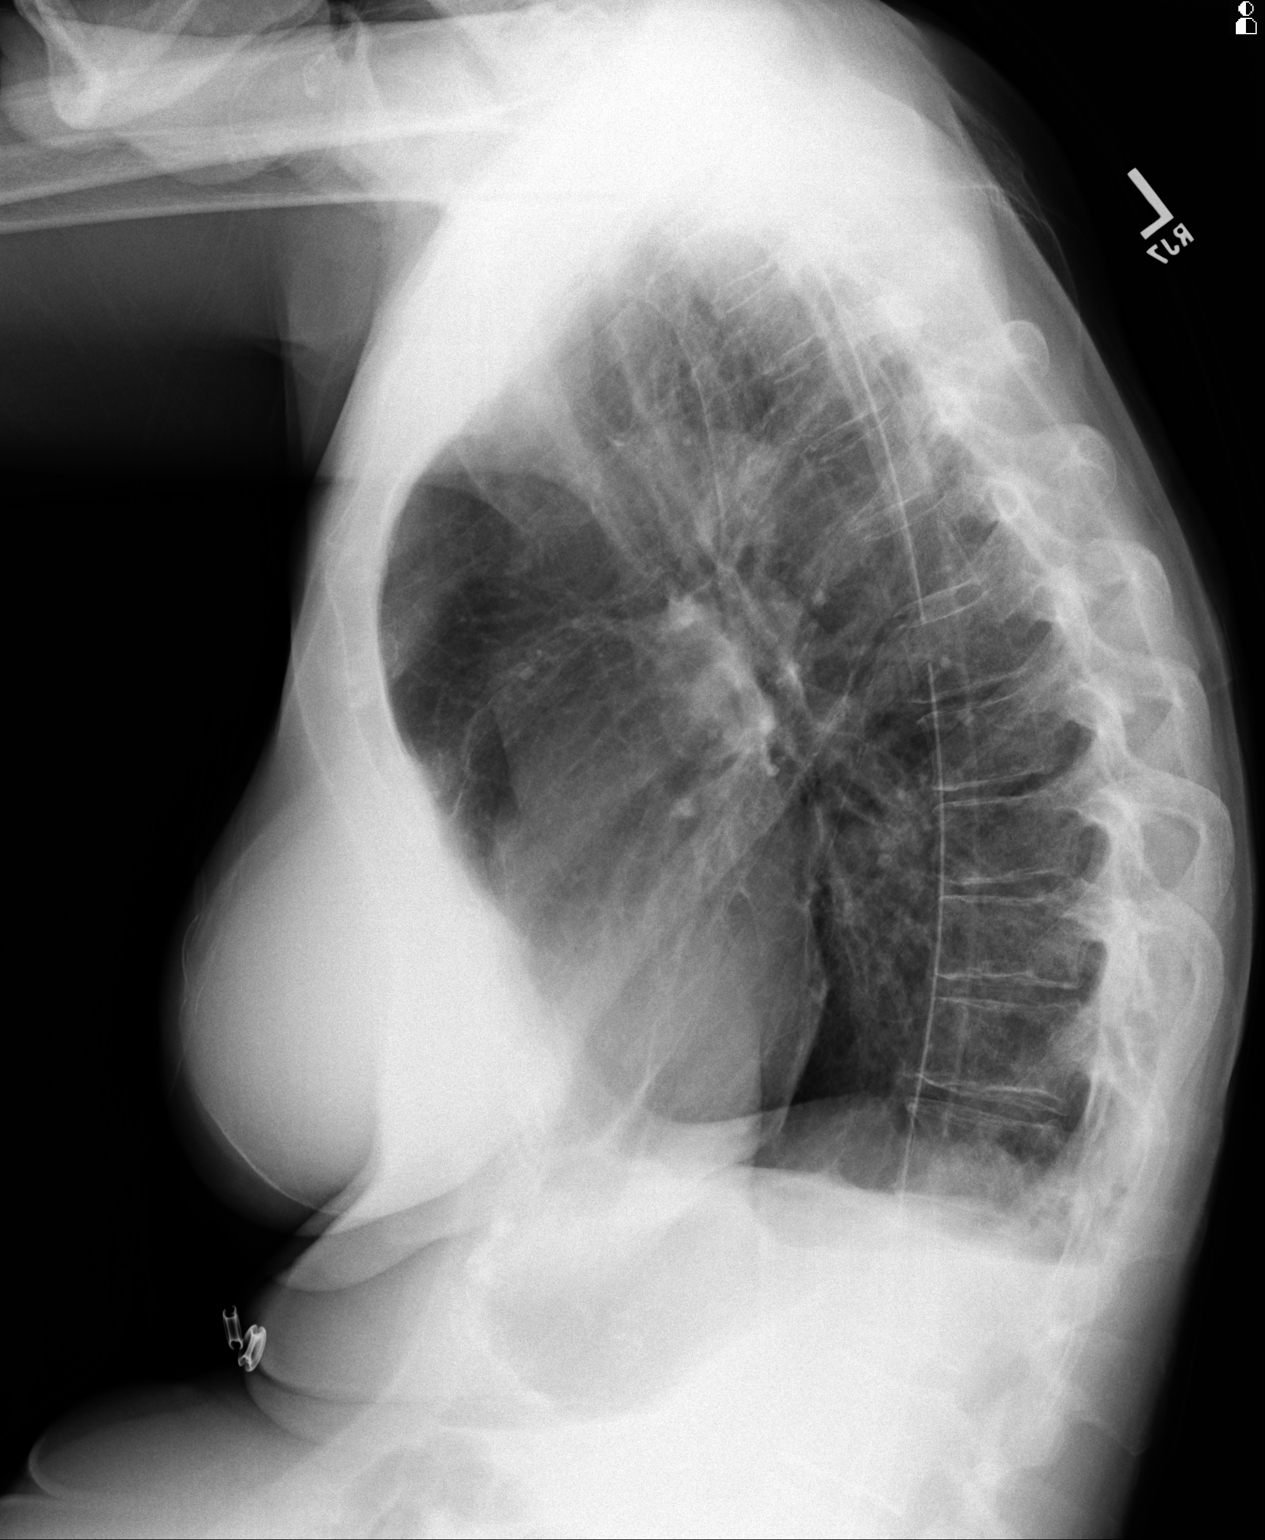

[2 of 2 positions shown; findings below may reference images not displayed]

FINDINGS: The heart and mediastinum are stable, given patient rotation. Right-sided
chest tube remains. There is lucency at the medial right lung base which is
similar to prior and may resent residual basilar pneumothorax. Mild right
basilar opacities are likely secondary to atelectasis.
IMPRESSION: Persistent lucency at the right lung base which may represent persistent
mild right basilar pneumothorax.

[REDACTED]

## 2013-09-06 IMAGING — CR DG CHEST 2V
1 series · 2 of 2 positions shown · non-contrast
Comparison: none

REASON FOR EXAM: assess for pleural effsuion
COMMENTS:

PROCEDURE:     DXR - DXR CHEST PA (OR AP) AND LATERAL  - May 26, 2012  [DATE]
RESULT:     Comparison: 05/23/2010

[Series 1: ap · 0.17mm/px · 2 of 2 slices shown]
[im 1/2]
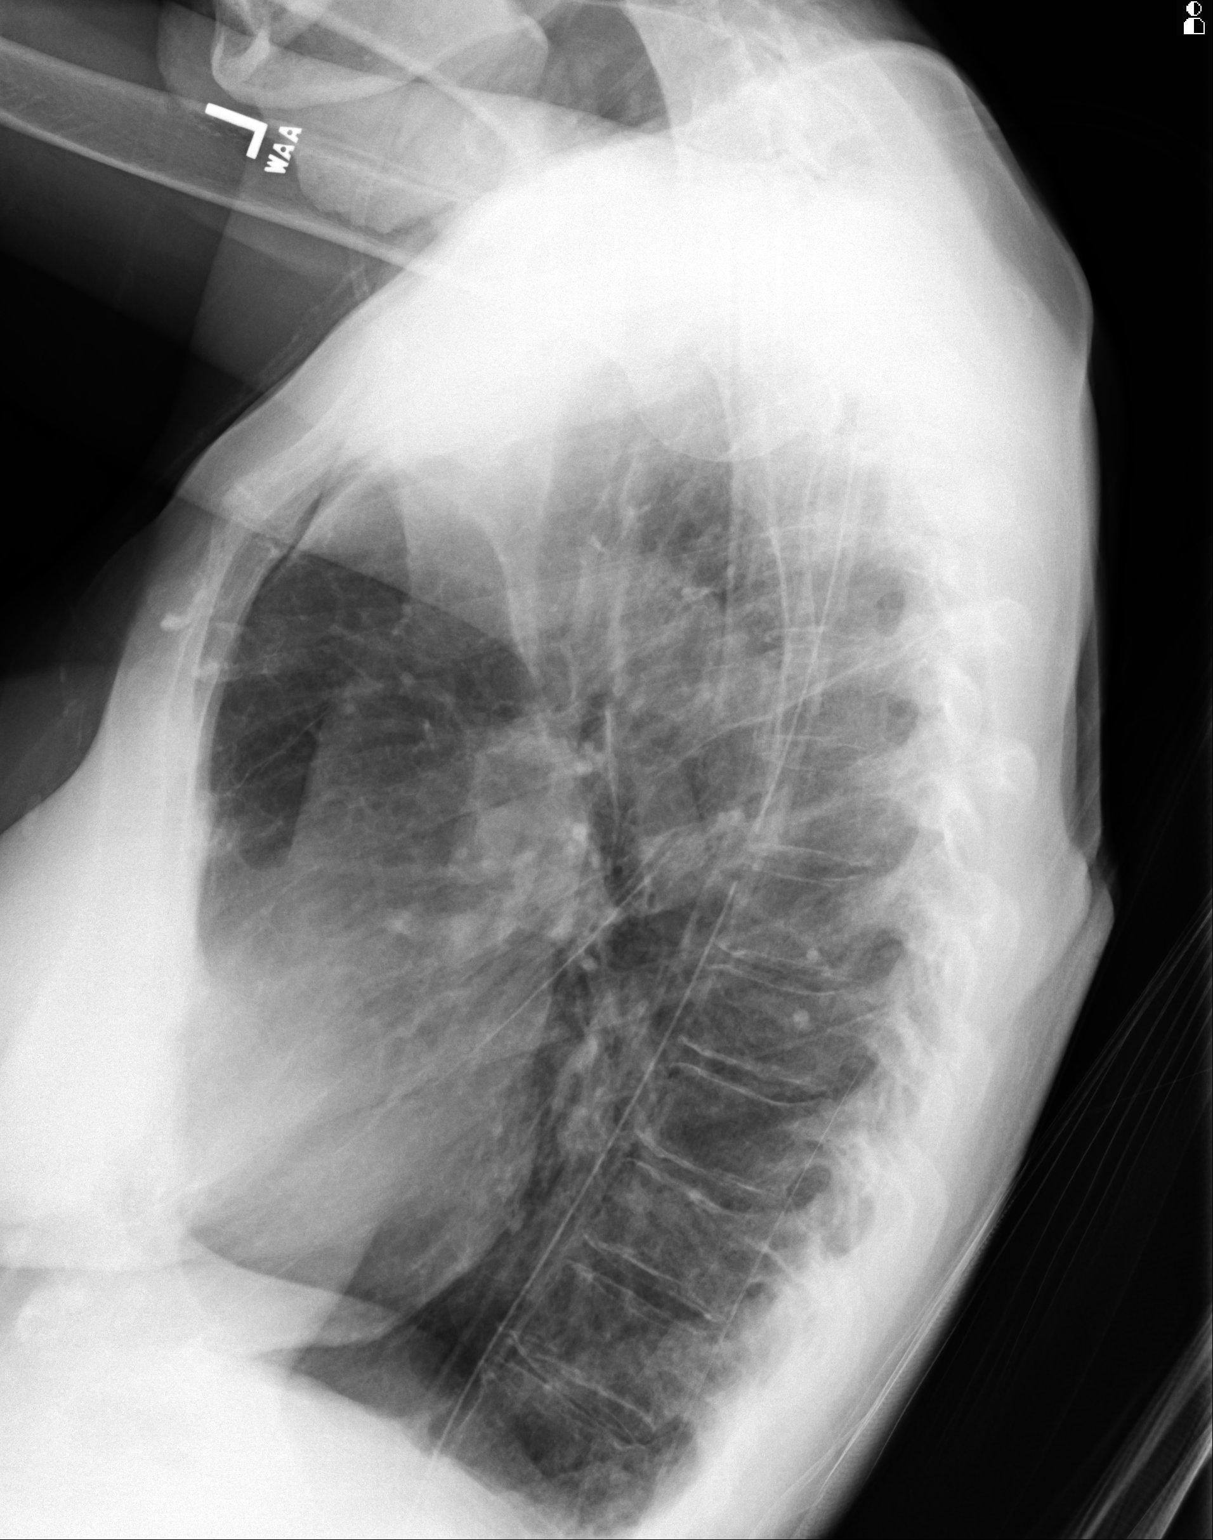
[im 2/2]
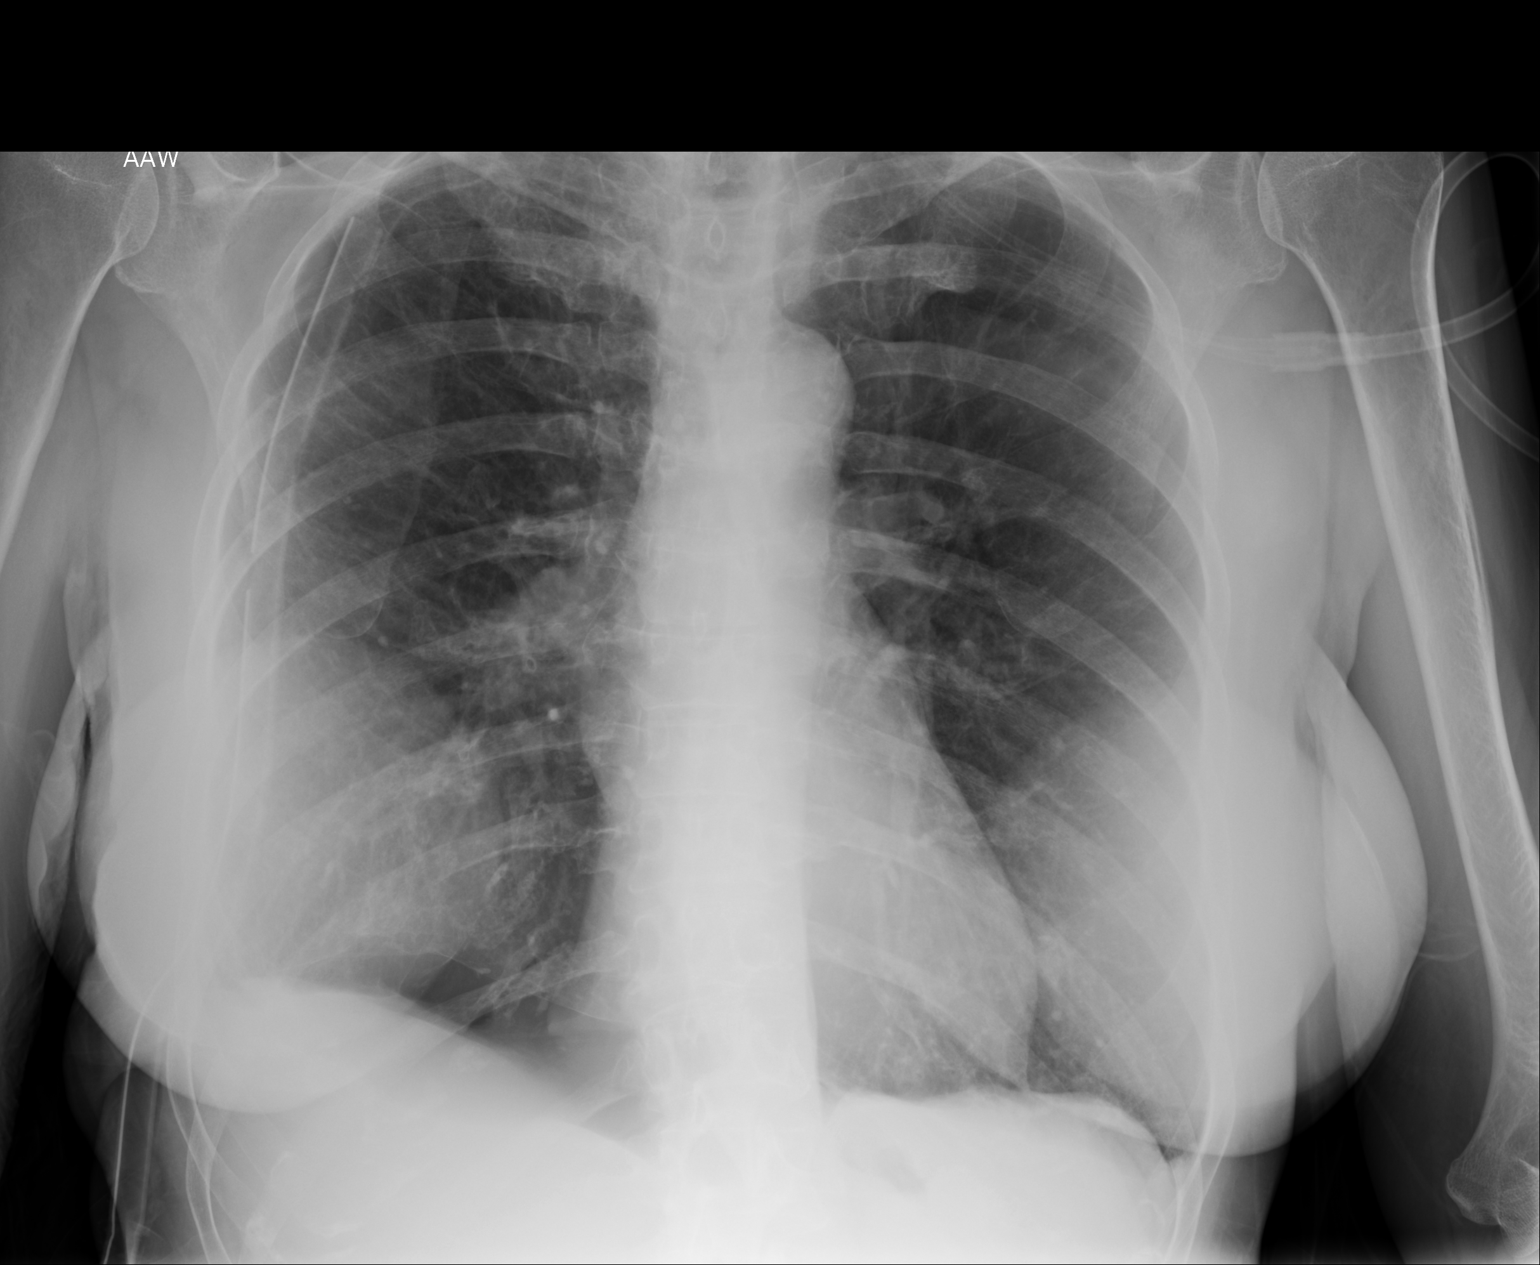

[2 of 2 positions shown; findings below may reference images not displayed]

FINDINGS: PA and lateral chest radiographs are provided. There is a right-sided chest
tube directed towards the apex. There is a persistent lucency at the right
lung base concerning for a basilar loculated pneumothorax. There is a small
right pleural effusion. The left lung is clear. The heart and mediastinum
are unremarkable. The osseous structures are unremarkable.
IMPRESSION: There is a right-sided chest tube directed towards the apex. There is a
persistent lucency at the right lung base concerning for a basilar loculated
pneumothorax.

[REDACTED]

## 2013-09-06 IMAGING — CR DG CHEST 2V
1 series · 2 of 2 positions shown · non-contrast
Comparison: none

REASON FOR EXAM: assess for pleural effusion
COMMENTS:

PROCEDURE:     DXR - DXR CHEST PA (OR AP) AND LATERAL  - May 26, 2012  [DATE]
RESULT:     Comparison: 05/23/2010

[Series 1: x chest ap · 0.14mm/px · 2 of 2 slices shown]
[im 1/2]
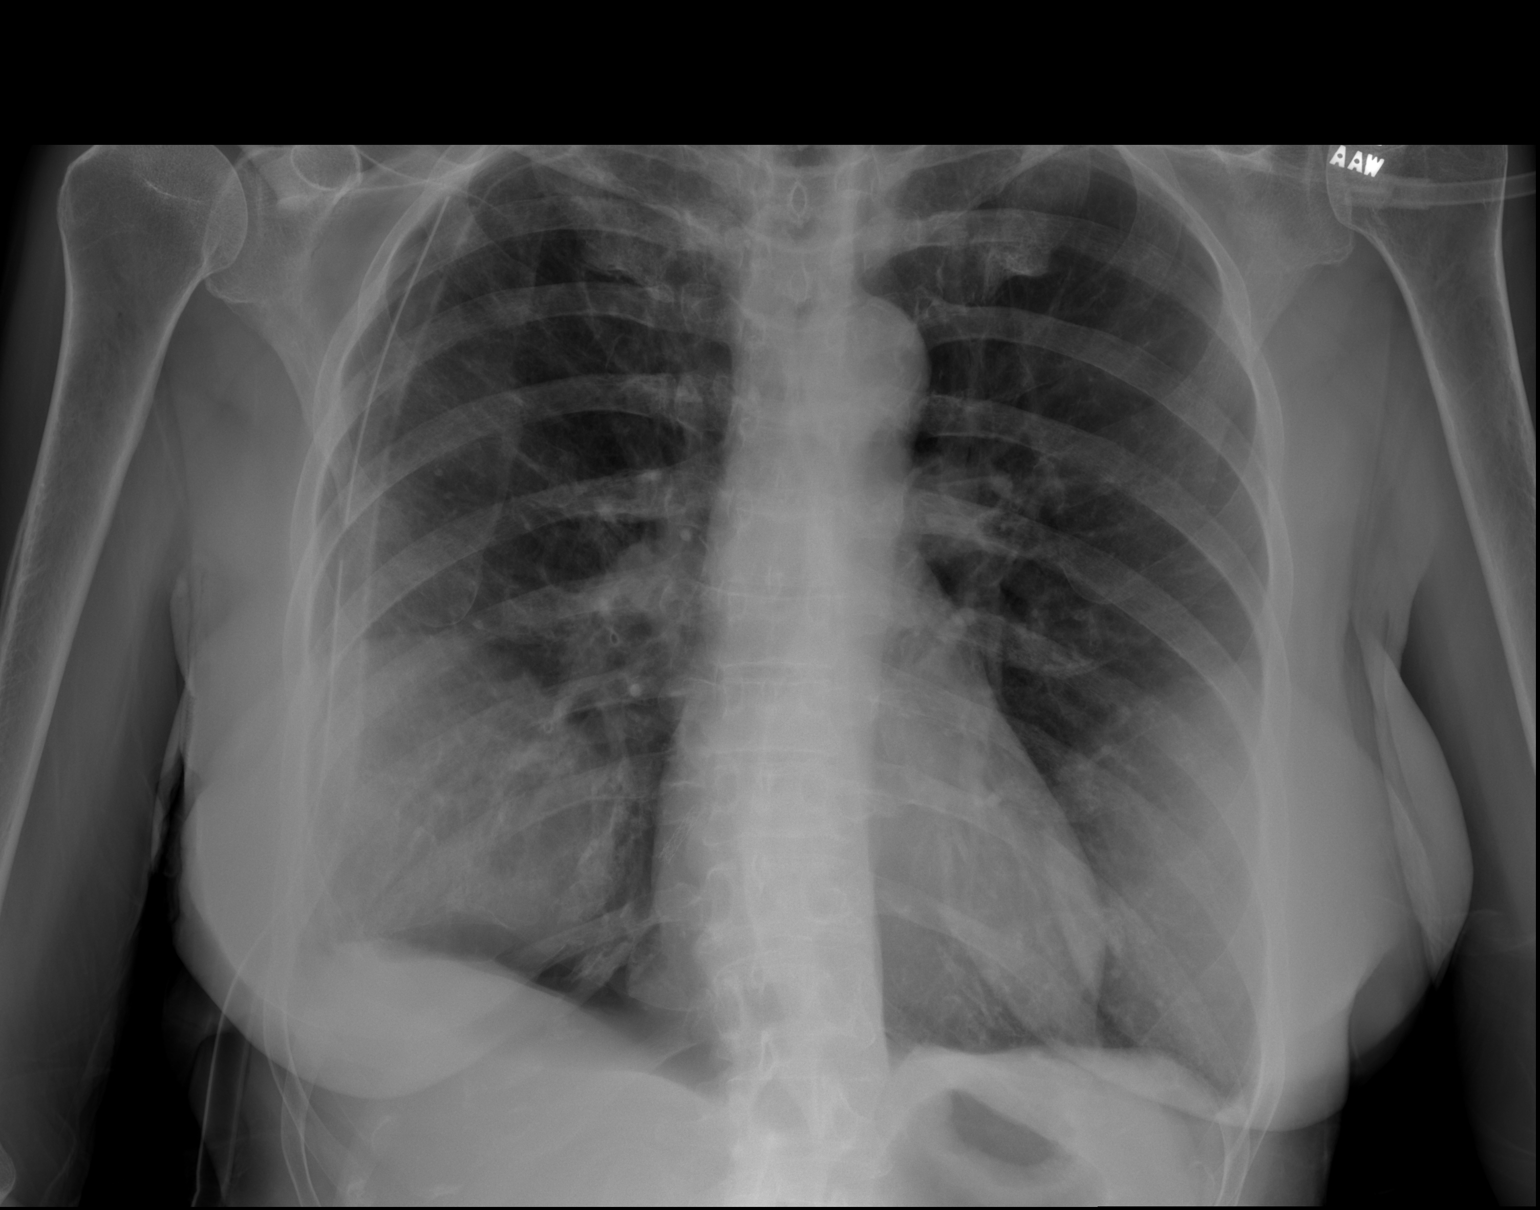
[im 2/2]
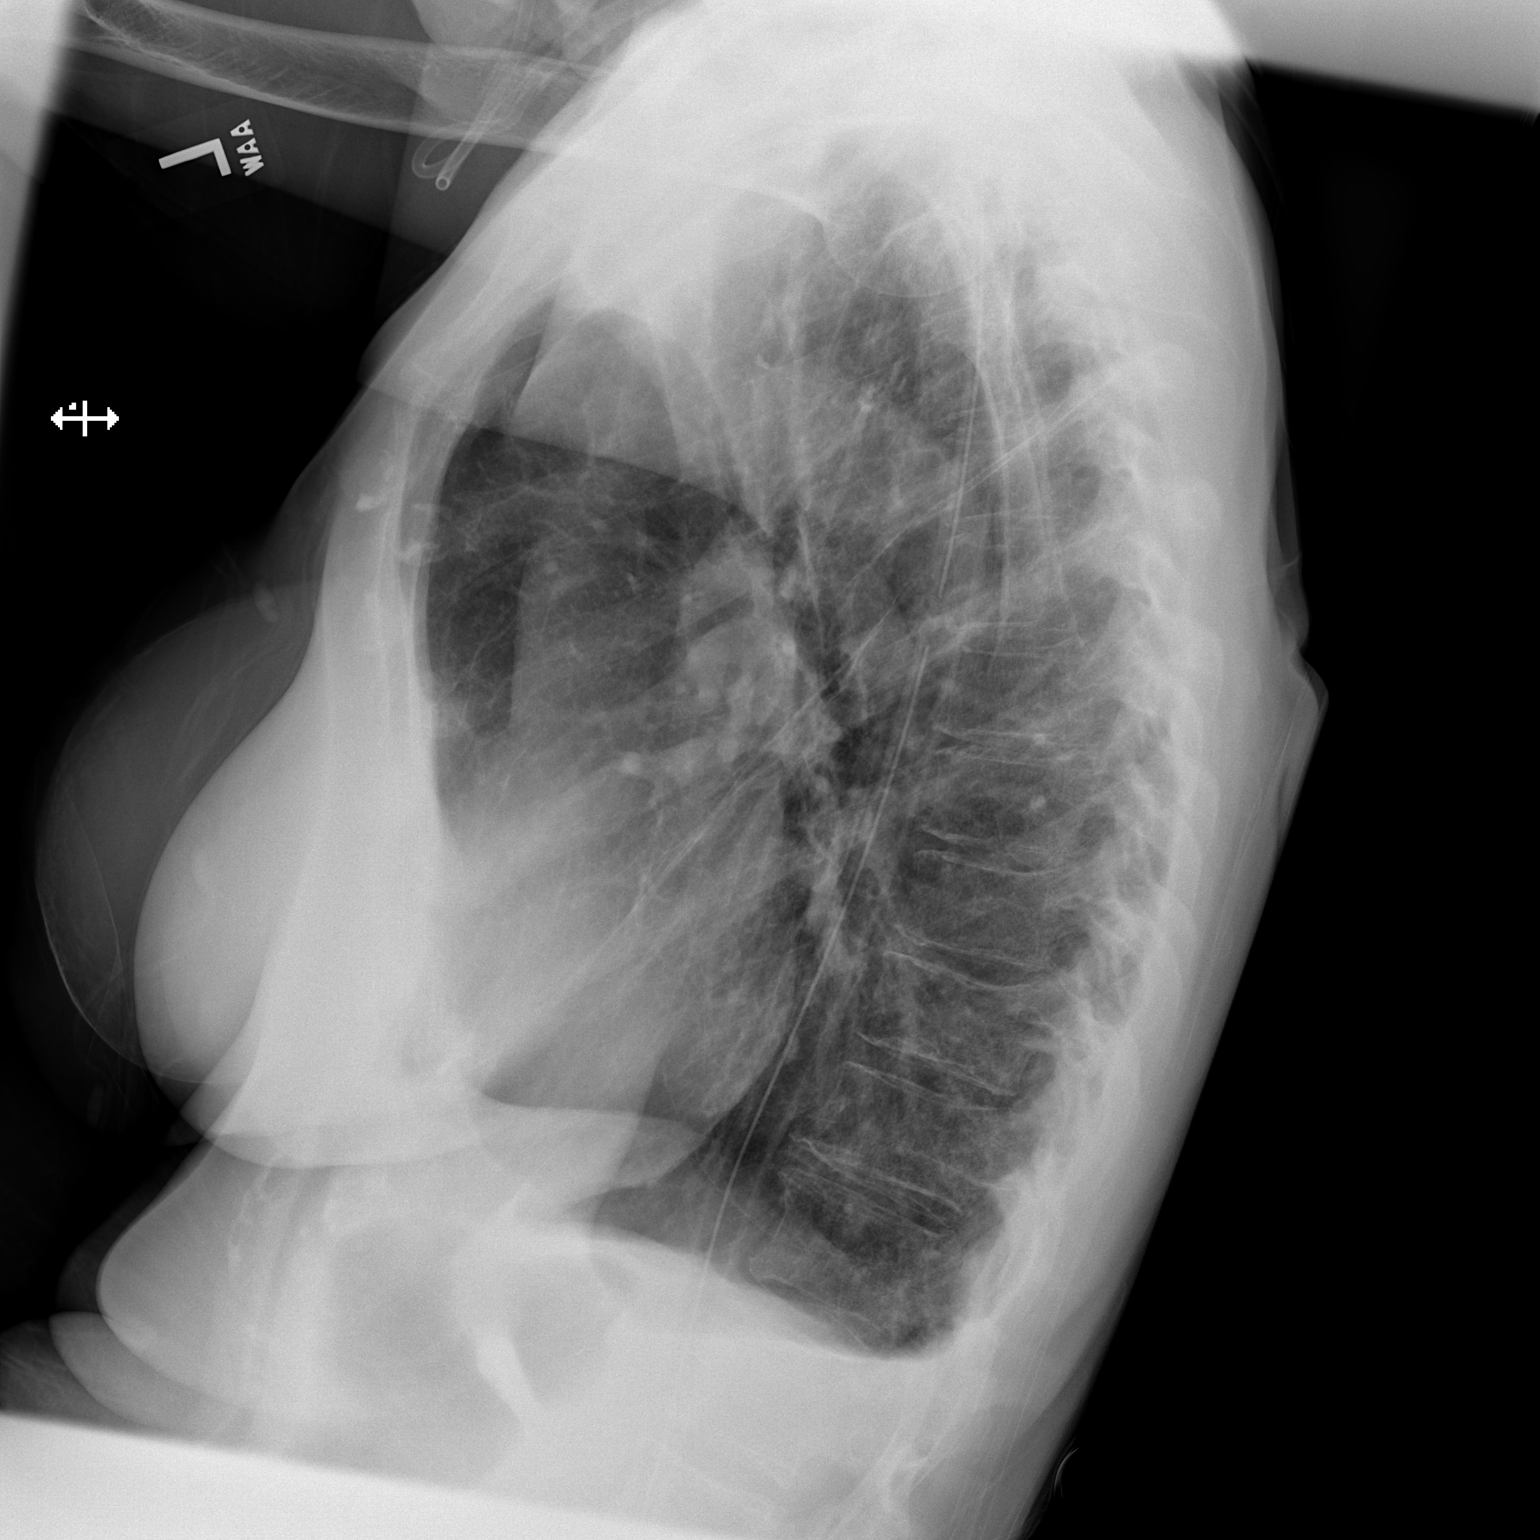

[2 of 2 positions shown; findings below may reference images not displayed]

FINDINGS: PA and lateral chest radiographs are provided. There is a right-sided chest
tube directed towards the apex. There is a persistent lucency at the right
lung base concerning for a basilar loculated pneumothorax. There is a small
right pleural effusion. The left lung is clear. The heart and mediastinum
are unremarkable.  The osseous structures are unremarkable.
IMPRESSION: Please see above.

[REDACTED]

## 2013-09-07 IMAGING — CR DG CHEST 1V PORT
1 series · 1 of 1 positions shown · non-contrast
Comparison: none

REASON FOR EXAM: pneumothorax-chest tube insertion
COMMENTS:

[ap]
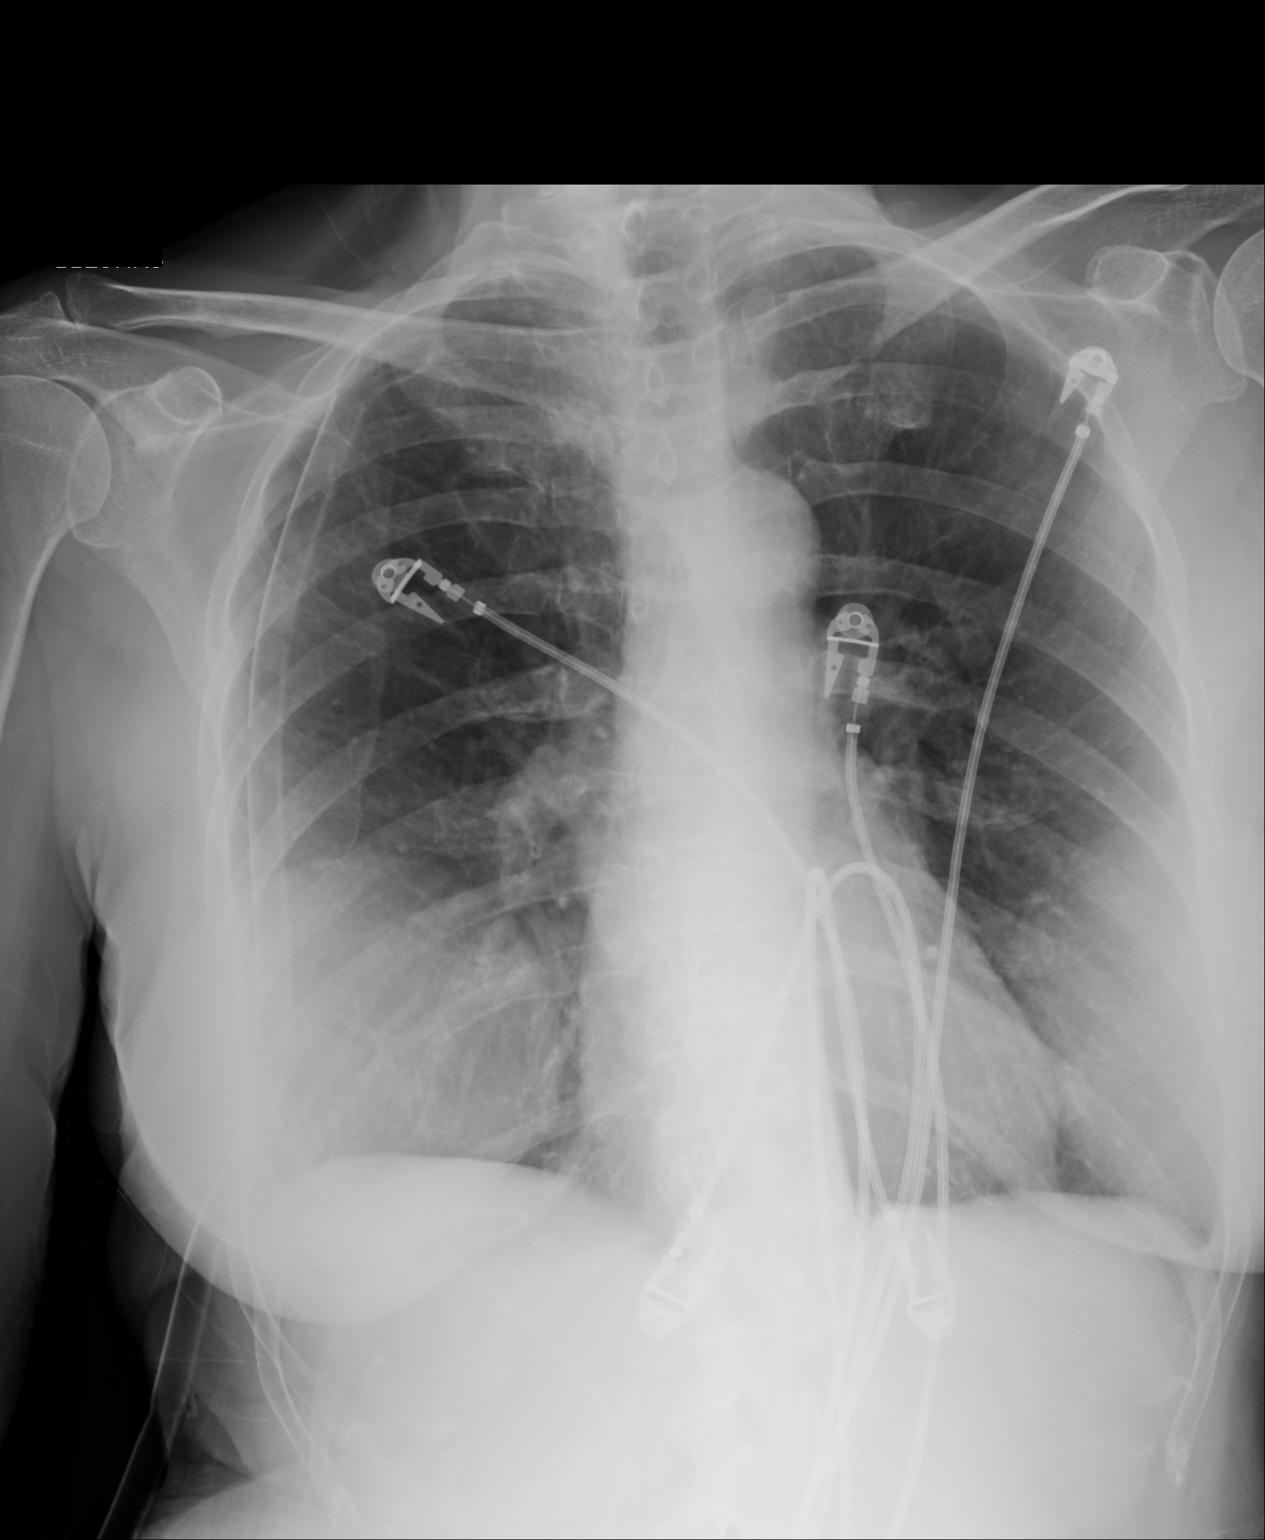

[1 of 1 positions shown; findings below may reference images not displayed]

PROCEDURE:     DXR - DXR PORTABLE CHEST SINGLE VIEW  - May 27, 2012 [DATE]

RESULT:     Recently placed small 6 French chest tube noted over the right
right lung base. Previously identified subpulmonic pneumothorax has been
relieved. Large Chest tube again noted with tip over the right apex. Very
tiny apical pneumothorax cannot be completely excluded. Lungs are clear.
Heart size normal. Bilateral breast implants.
IMPRESSION: Recently placed small 6 French chest tube noted over the
right lung base . Previously identified right subpulmonic
pneumothorax has been relieved.

## 2013-09-07 IMAGING — CR DG CHEST 2V
1 series · 2 of 2 positions shown · non-contrast
Comparison: none

REASON FOR EXAM: assess for pleural effusion
COMMENTS:

PROCEDURE:     DXR - DXR CHEST PA (OR AP) AND LATERAL  - May 27, 2012  [DATE]
RESULT:     Comparison: 05/26/2012, 05/23/2012

[Series 3: x chest ap · 0.14mm/px · 2 of 2 slices shown]
[im 1/2]
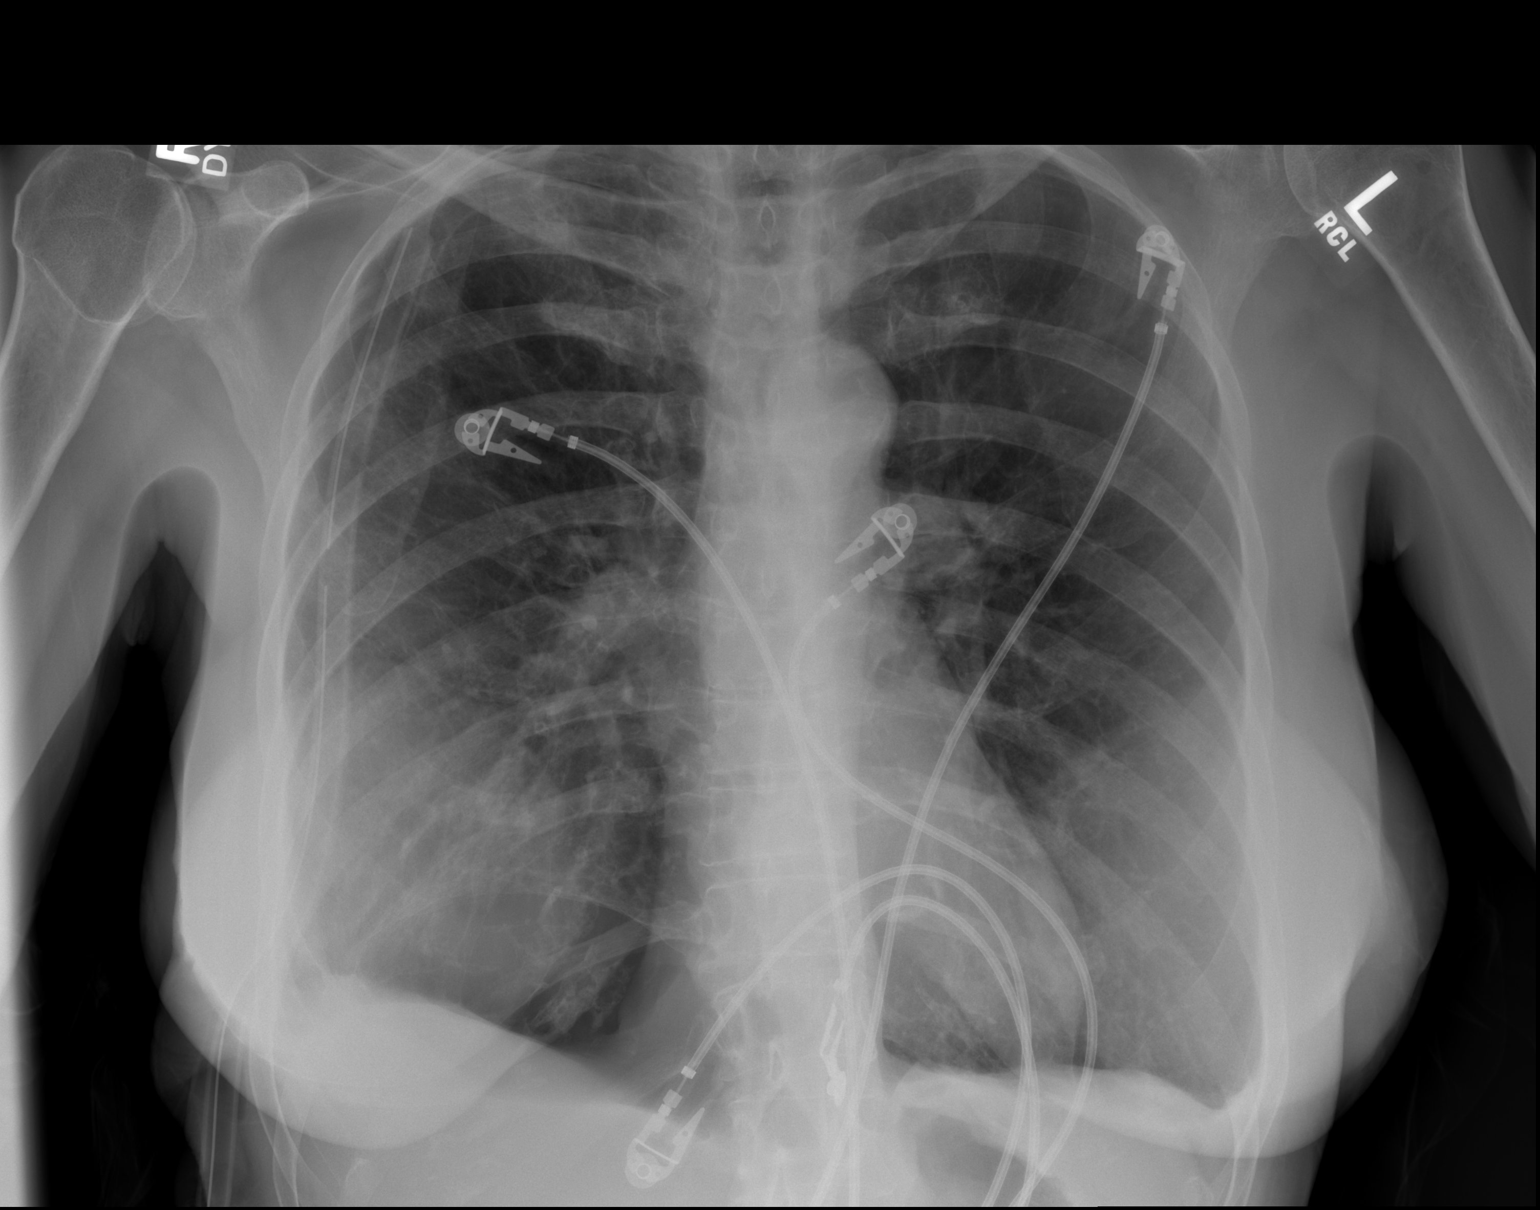
[im 2/2]
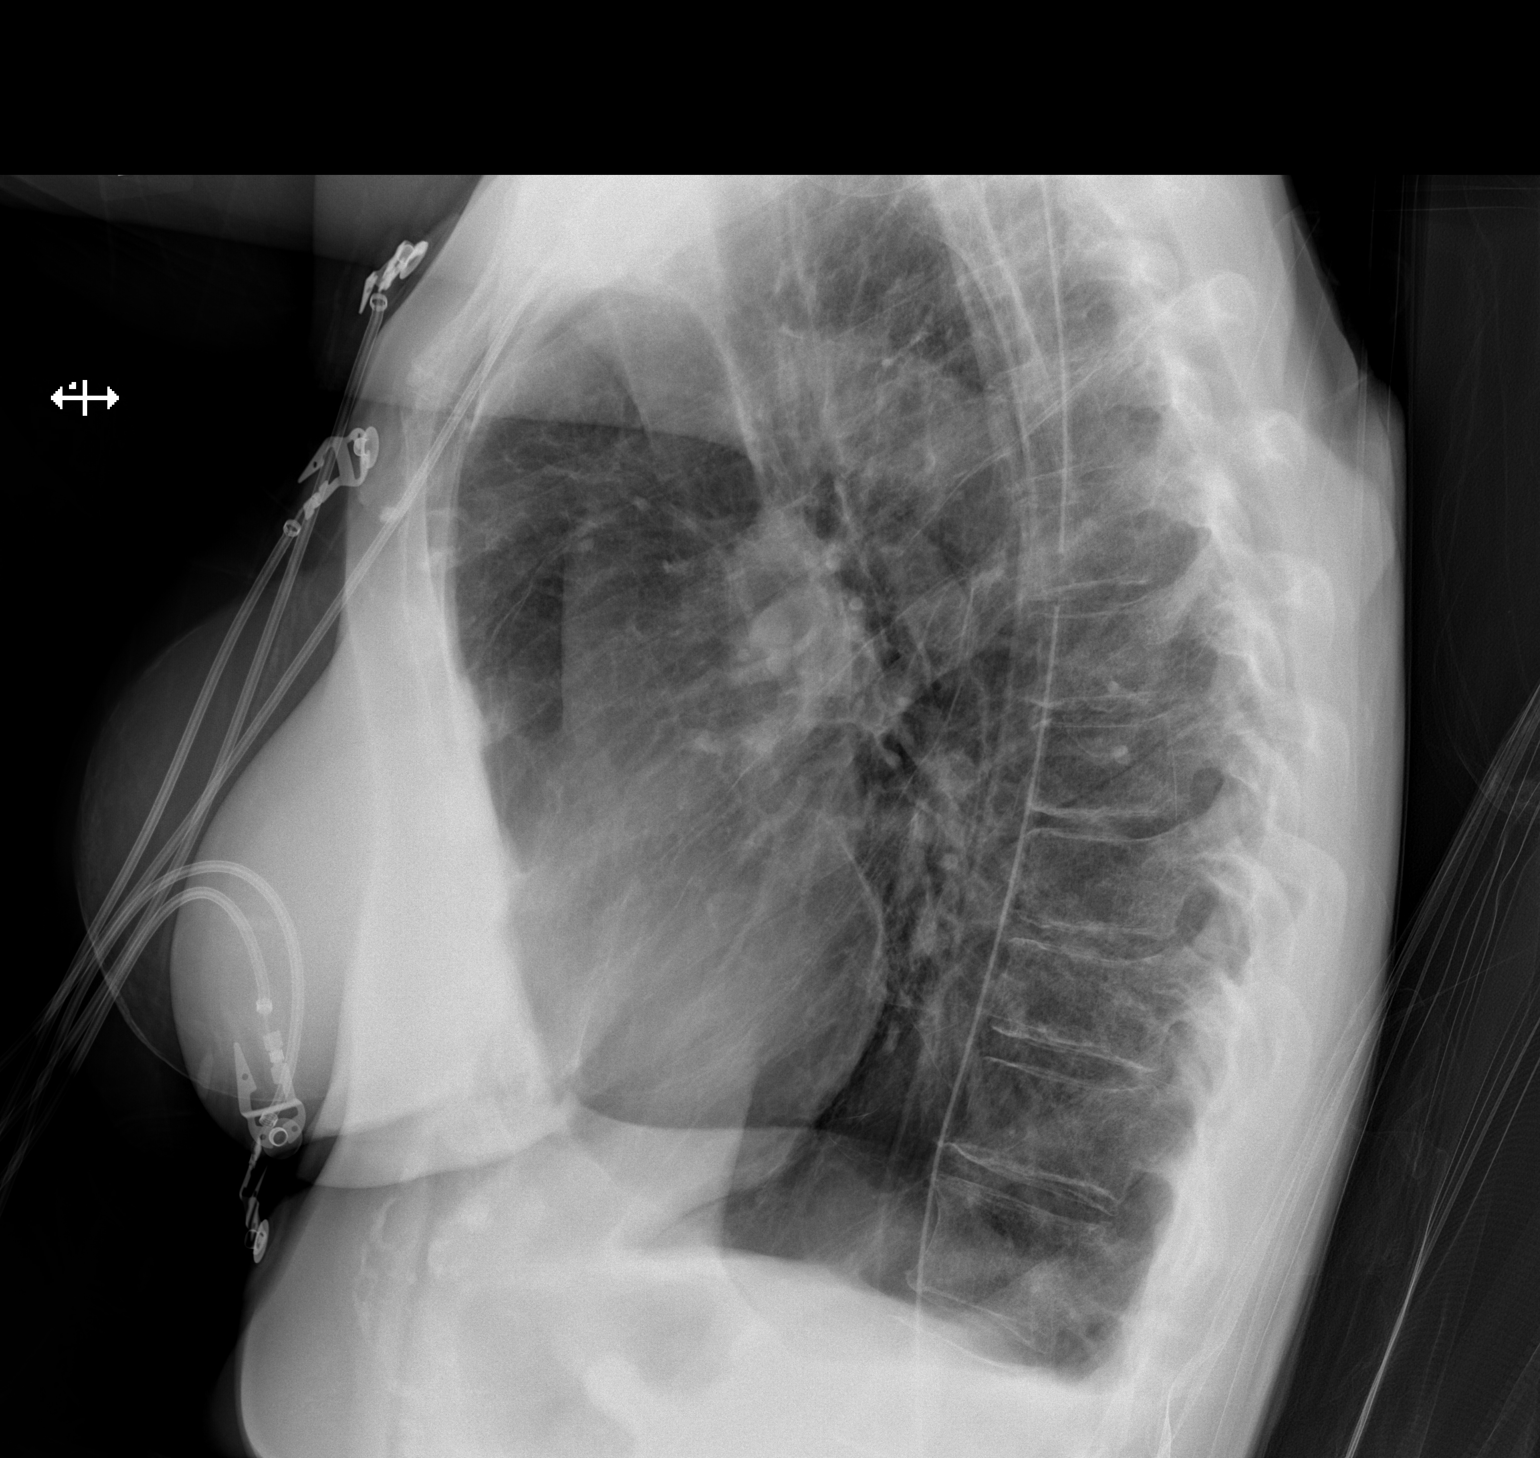

[2 of 2 positions shown; findings below may reference images not displayed]

FINDINGS: PA and lateral chest radiographs are provided. There is a right-sided chest
tube directed towards the apex. There is no pneumothorax. There is a small
right pleural effusion. There is a trace left pleural effusion. The lungs
are hyperinflated likely secondary to COPD.. The heart and mediastinum are
unremarkable.  The osseous structures are unremarkable.
IMPRESSION: Right-sided chest tube without pneumothorax. Small right pleural effusion.

[REDACTED]

## 2013-09-07 IMAGING — CT CT GUIDE TUBE PLACEMENT
2 of 4 series · 14 of 32 positions shown, 20 images · non-contrast
Comparison: none

REASON FOR EXAM: right pneumothorax
COMMENTS:

[Series 2: soft tissue · axial · 0.68mm/px · z∈[-547,-286]mm · 11 of 105 slices shown, 17 images (1 of 2)]
[im 9/105  soft-tissue]
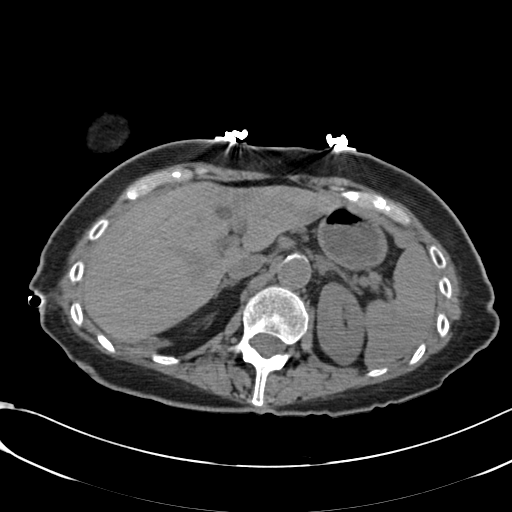
[im 9/105  bone]
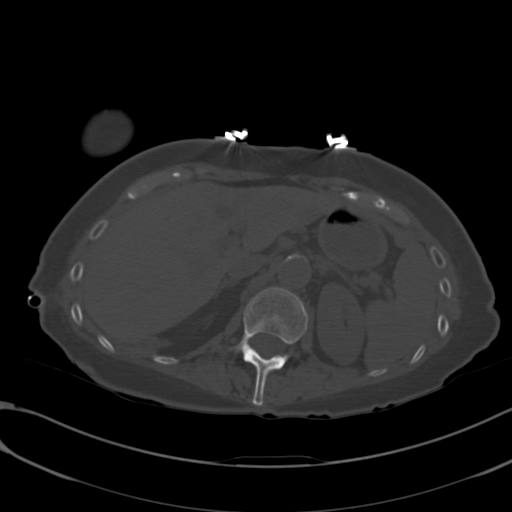
[im 18/105  soft-tissue]
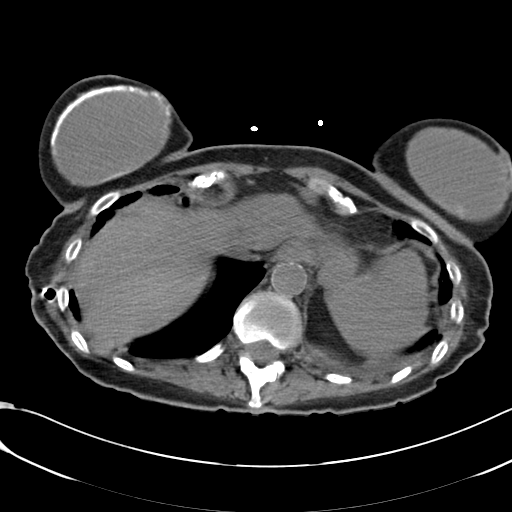
[im 27/105  soft-tissue]
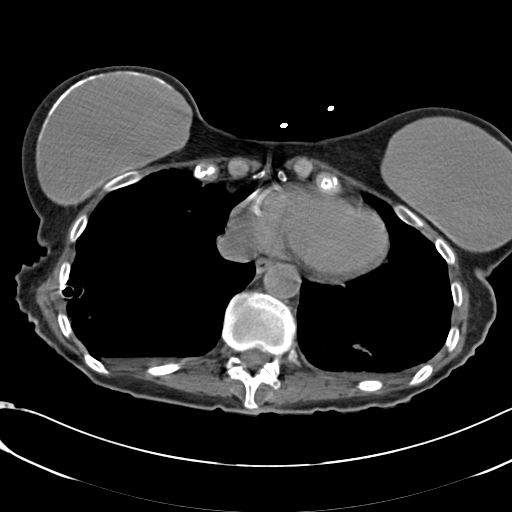
[im 35/105  soft-tissue]
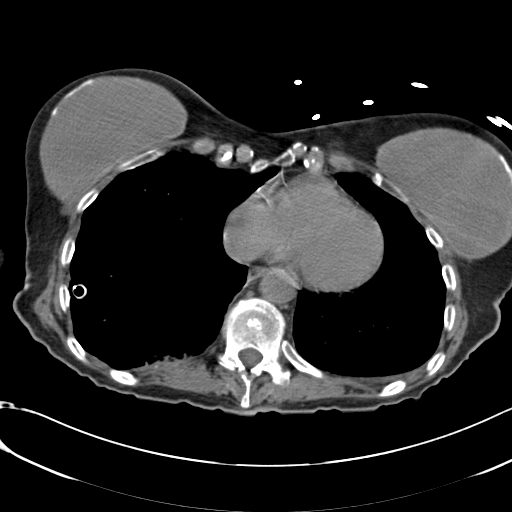
[im 44/105  soft-tissue]
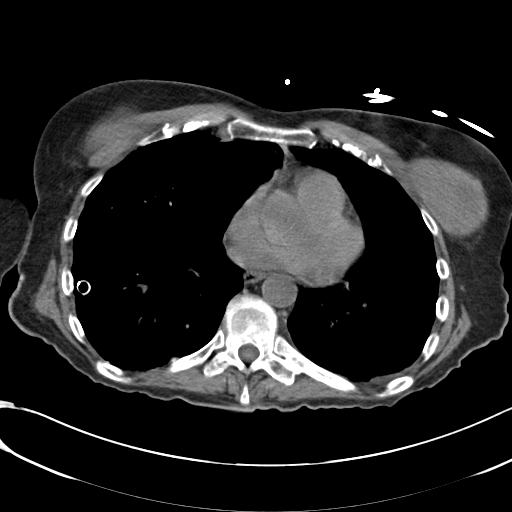
[im 53/105  soft-tissue]
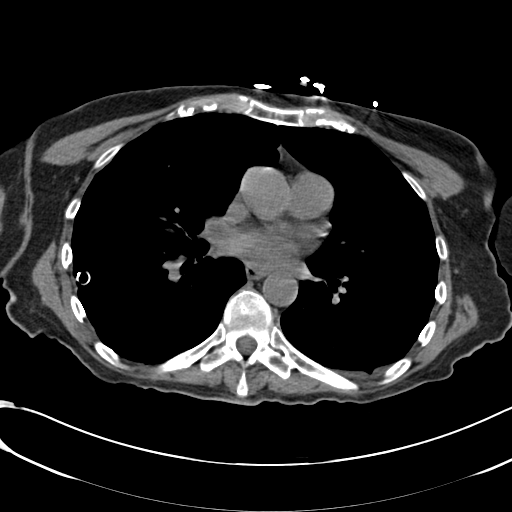
[im 61/105  soft-tissue]
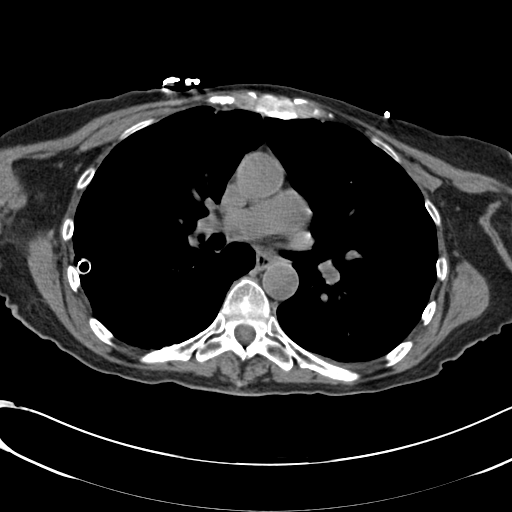
[im 70/105  soft-tissue]
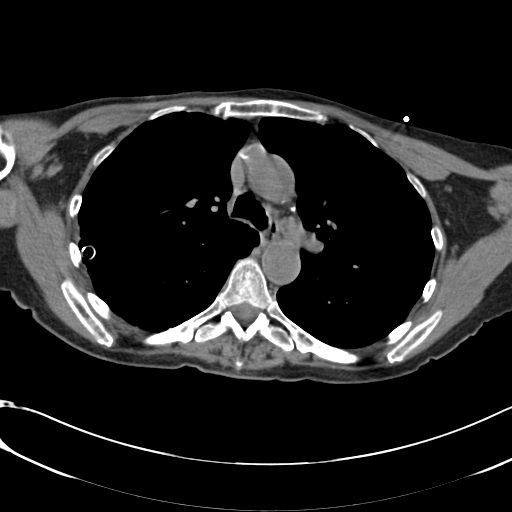
[im 70/105  lung]
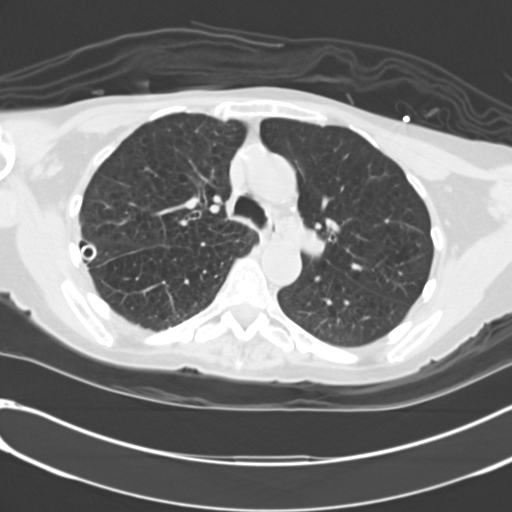
[im 79/105  soft-tissue]
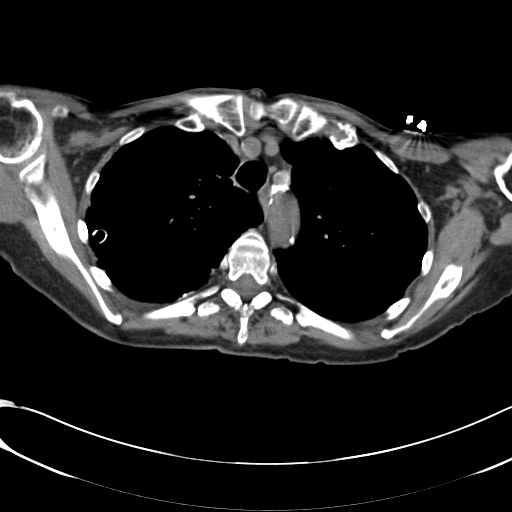
[im 79/105  lung]
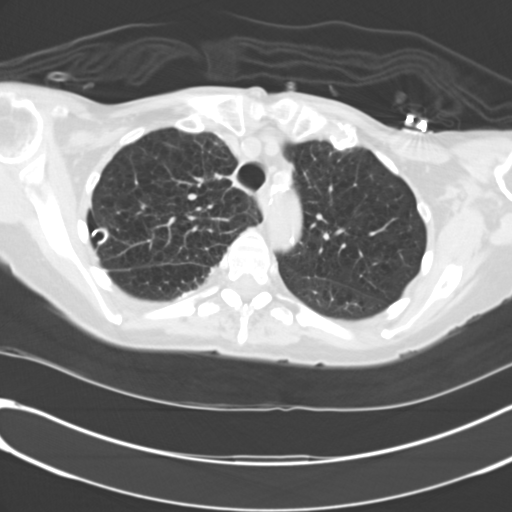
[im 79/105  bone]
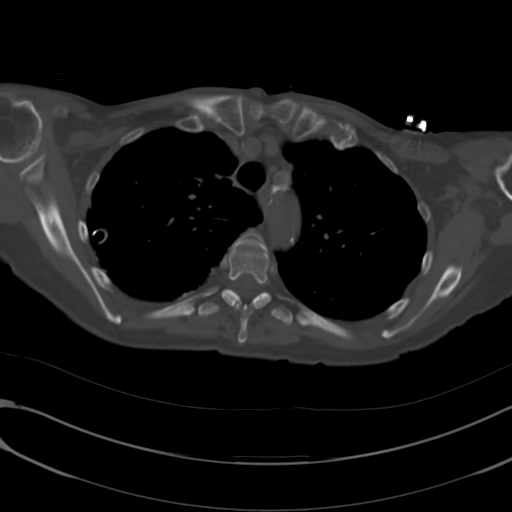
[im 87/105  soft-tissue]
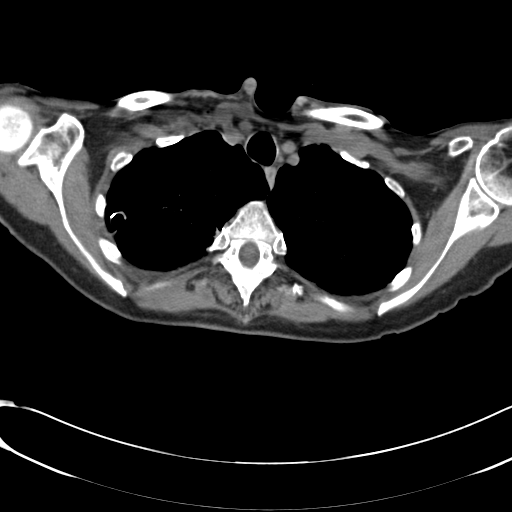
[im 87/105  lung]
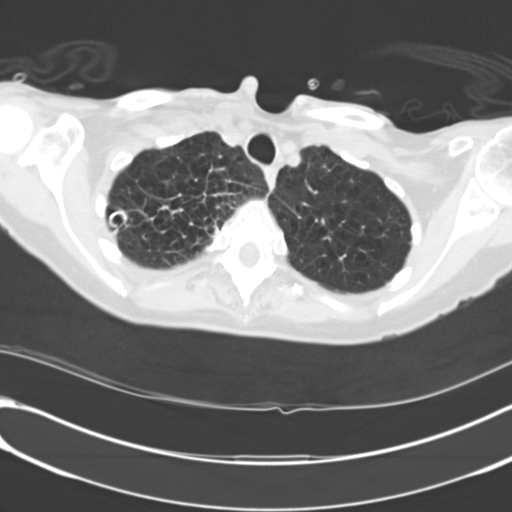
[im 96/105  soft-tissue]
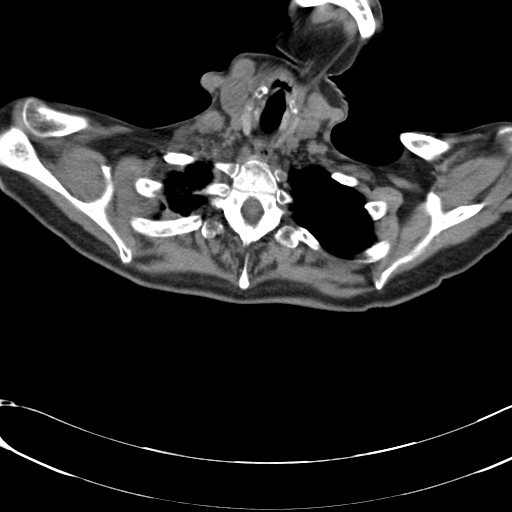
[im 96/105  lung]
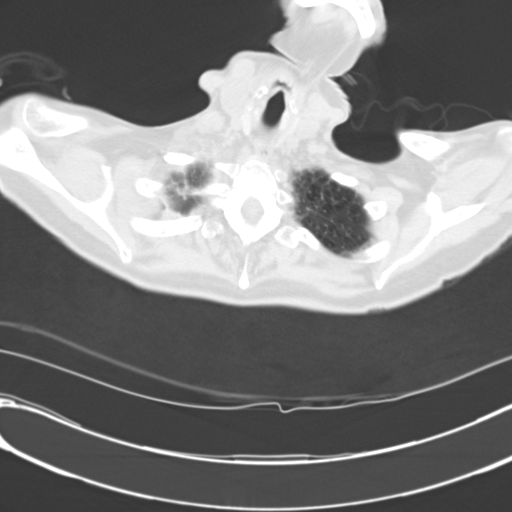

[Series 7: soft tissue · axial · 0.59mm/px · z∈[-590,-536]mm · 3 of 90 slices shown (2 of 2)]
[im 9/90  soft-tissue]
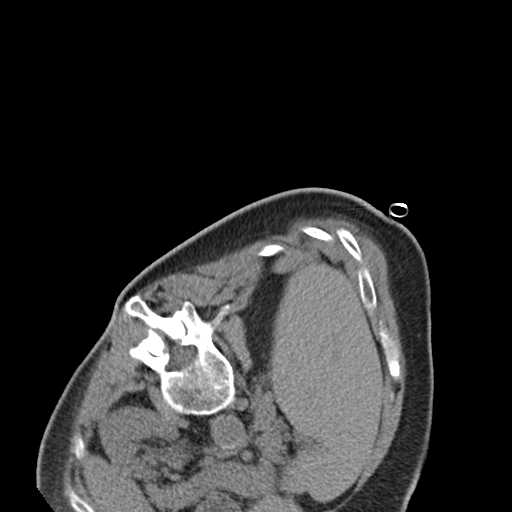
[im 18/90  soft-tissue]
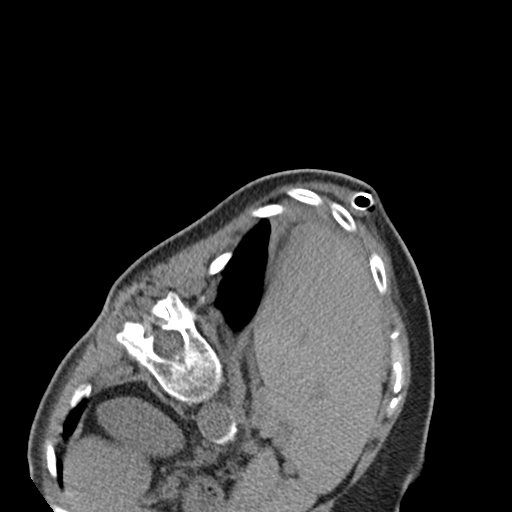
[im 27/90  soft-tissue]
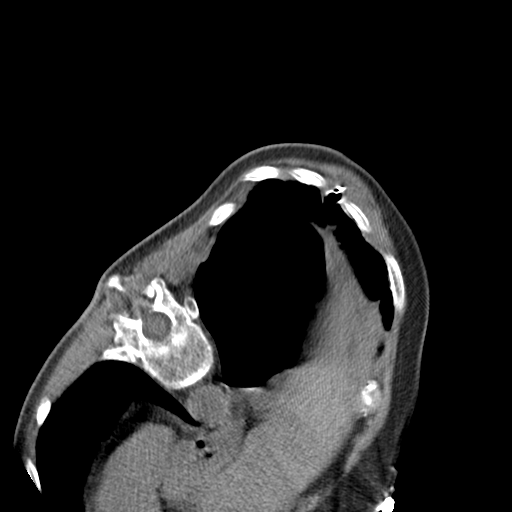

[14 of 32 positions shown; findings below may reference images not displayed]

PROCEDURE:     CT  - CT GUIDED TUBE PLACEMENT  - May 27, 2012 [DATE]

RESULT:     After discussing this procedure with the patient informed
consent was obtained. Right lower posterior chest was sterilely prepped and
draped. Following local anesthesia with 1% lidocaine and IV conscious
sedation a 6 French trocar chest tube was inserted into the posterior lower
pleural space and subpulmonic pneumothorax relieved. Chest tube placed to
Pleur-evac drainage.
IMPRESSION: Successful placement of 6 French chest tube in the right
subpulmonic pneumothorax.

## 2013-09-09 IMAGING — CR DG CHEST 1V PORT
1 series · 1 of 1 positions shown · non-contrast
Comparison: none

REASON FOR EXAM: chest tube
COMMENTS:

PROCEDURE:     DXR - DXR PORTABLE CHEST SINGLE VIEW  - May 29, 2012  [DATE]
RESULT:     Comparison: 05/27/2012

[ap]
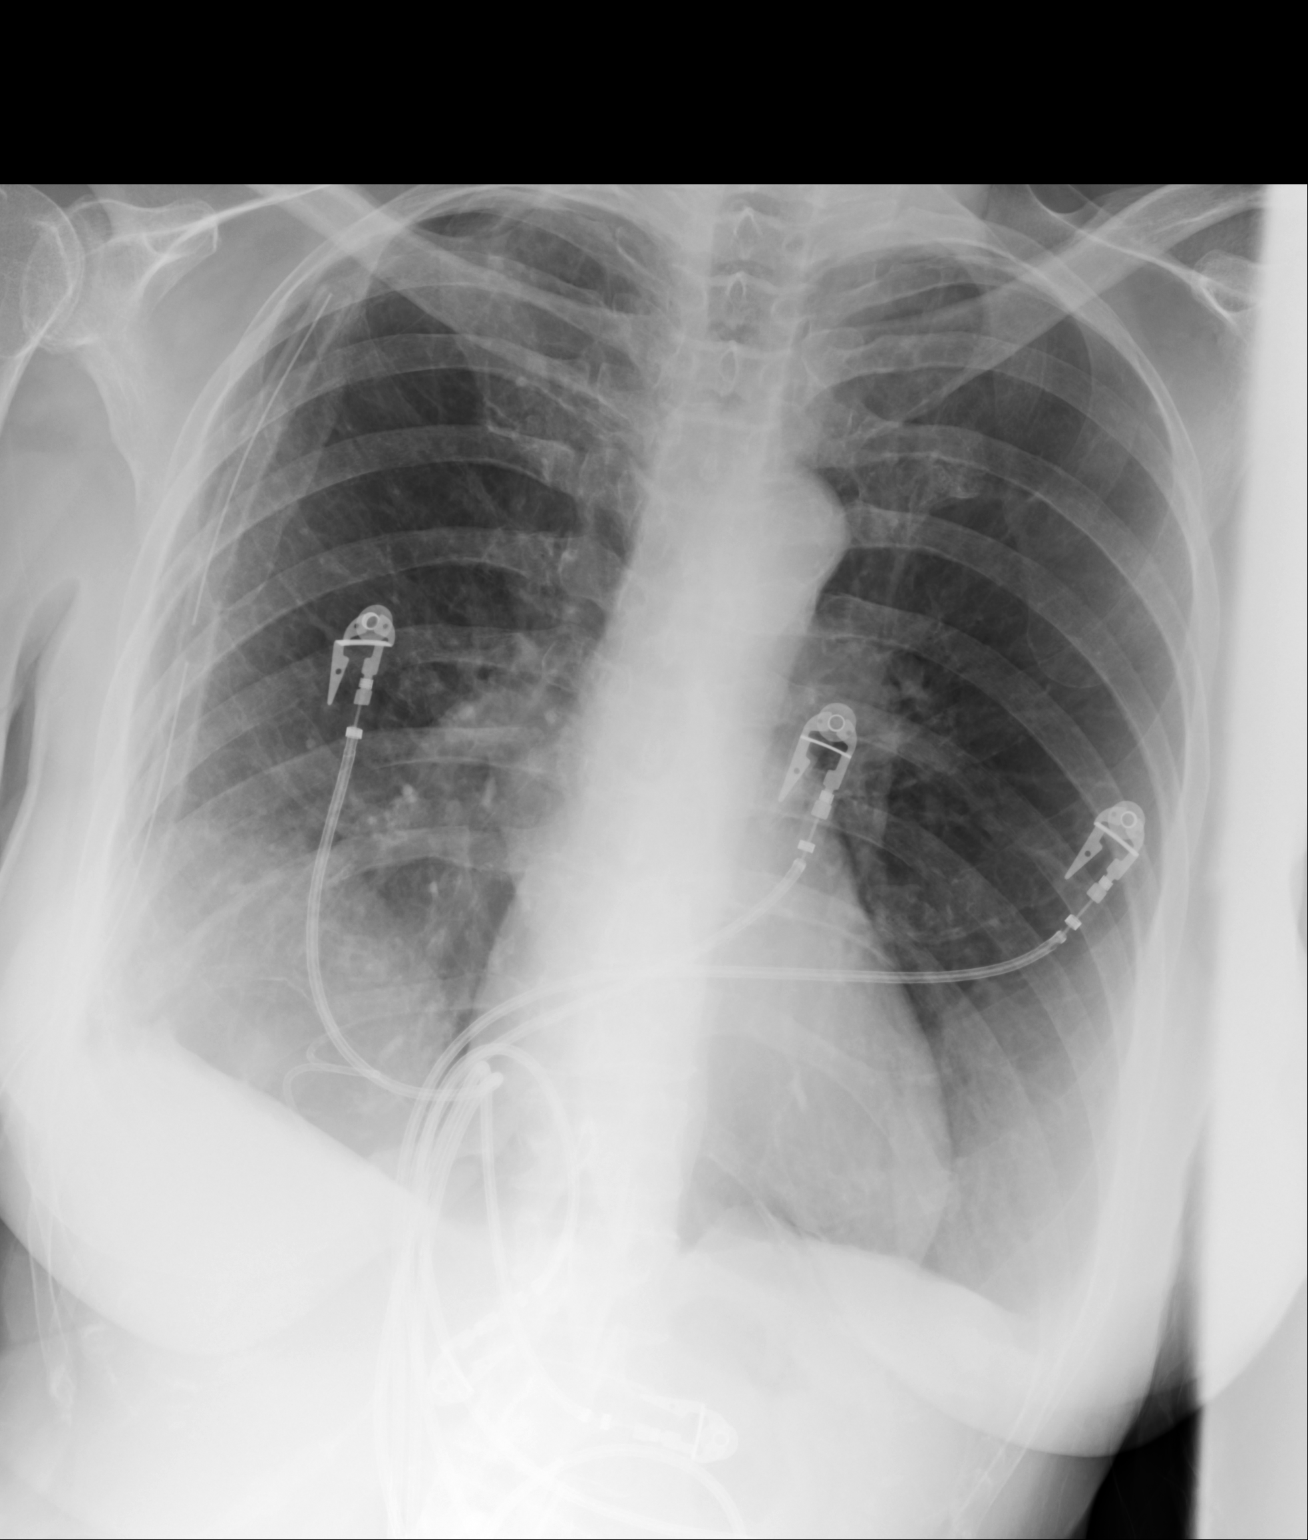

[1 of 1 positions shown; findings below may reference images not displayed]

FINDINGS: Single portable AP chest radiograph is provided. There is a right-sided
chest tube directed towards the apex. There is a right basilar a pigtail
catheter will. There is no pneumothorax. There is blunting of the right
costophrenic angle likely representing scarring versus a small amount of
fluid. The left lung is clear. Normal heart and mediastinum. The osseous
structures are unremarkable.
IMPRESSION: No pneumothorax.

[REDACTED]

## 2013-09-10 IMAGING — CR DG CHEST 1V PORT
1 series · 1 of 1 positions shown · non-contrast
Comparison: none

REASON FOR EXAM: assess for pleural effusion
COMMENTS:

[ap]
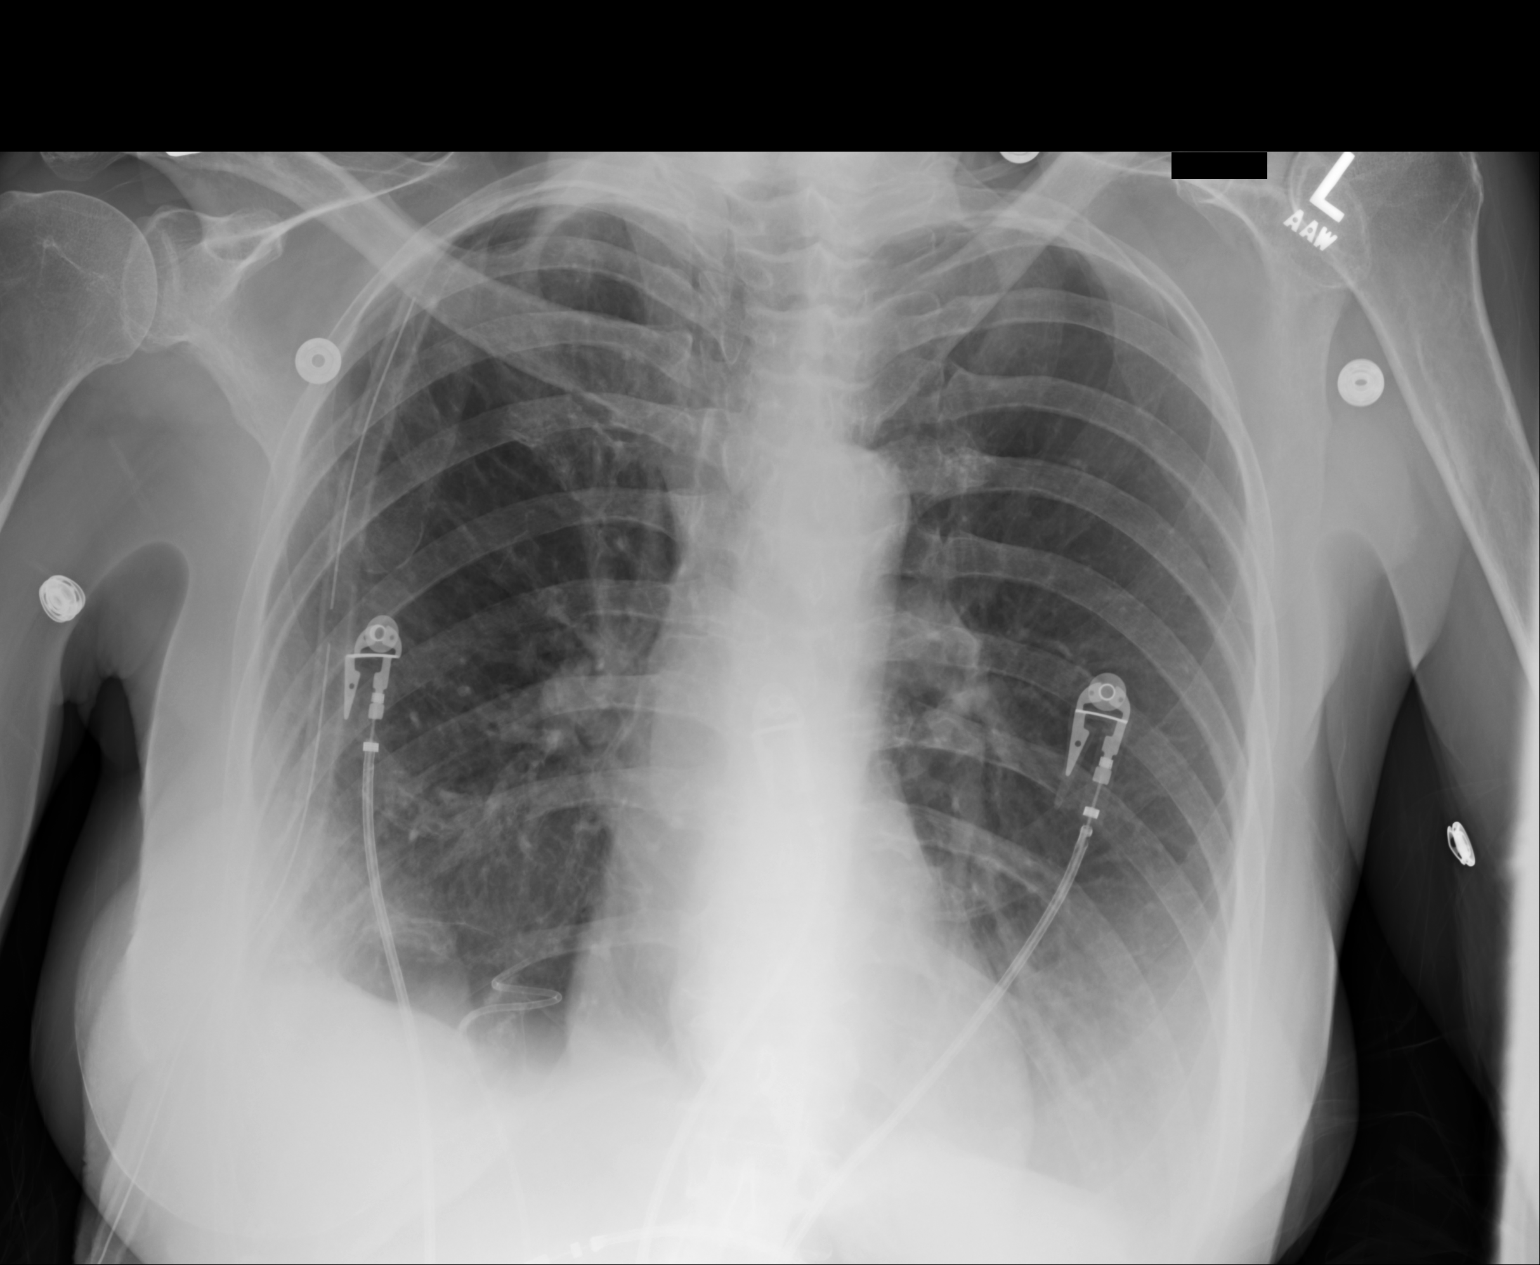

[1 of 1 positions shown; findings below may reference images not displayed]

PROCEDURE:     DXR - DXR PORTABLE CHEST SINGLE VIEW  - May 30, 2012 [DATE]

RESULT:     Comparison is made to the previous exam on 05/29/2012 at [DATE]
a.m. Right chest tubes remain in place laterally with increased density in
the right costophrenic angle region and hazy increased density at the left
lung base which could represent overlying left breast tissue. There is no
definite pneumothorax appreciated. Cardiac monitoring electrodes are present
. The cardiac silhouette is normal. There is no diffuse interstitial edema
or infiltrate.
IMPRESSION: Underlying COPD. Right chest tube present with persistent
hazy density at the right lung base. No significant pneumothorax evident.

[REDACTED]

## 2013-09-11 IMAGING — CR DG CHEST 1V PORT
1 series · 1 of 1 positions shown · non-contrast
Comparison: none

REASON FOR EXAM: Assess for pleural effusion
COMMENTS:

[ap]
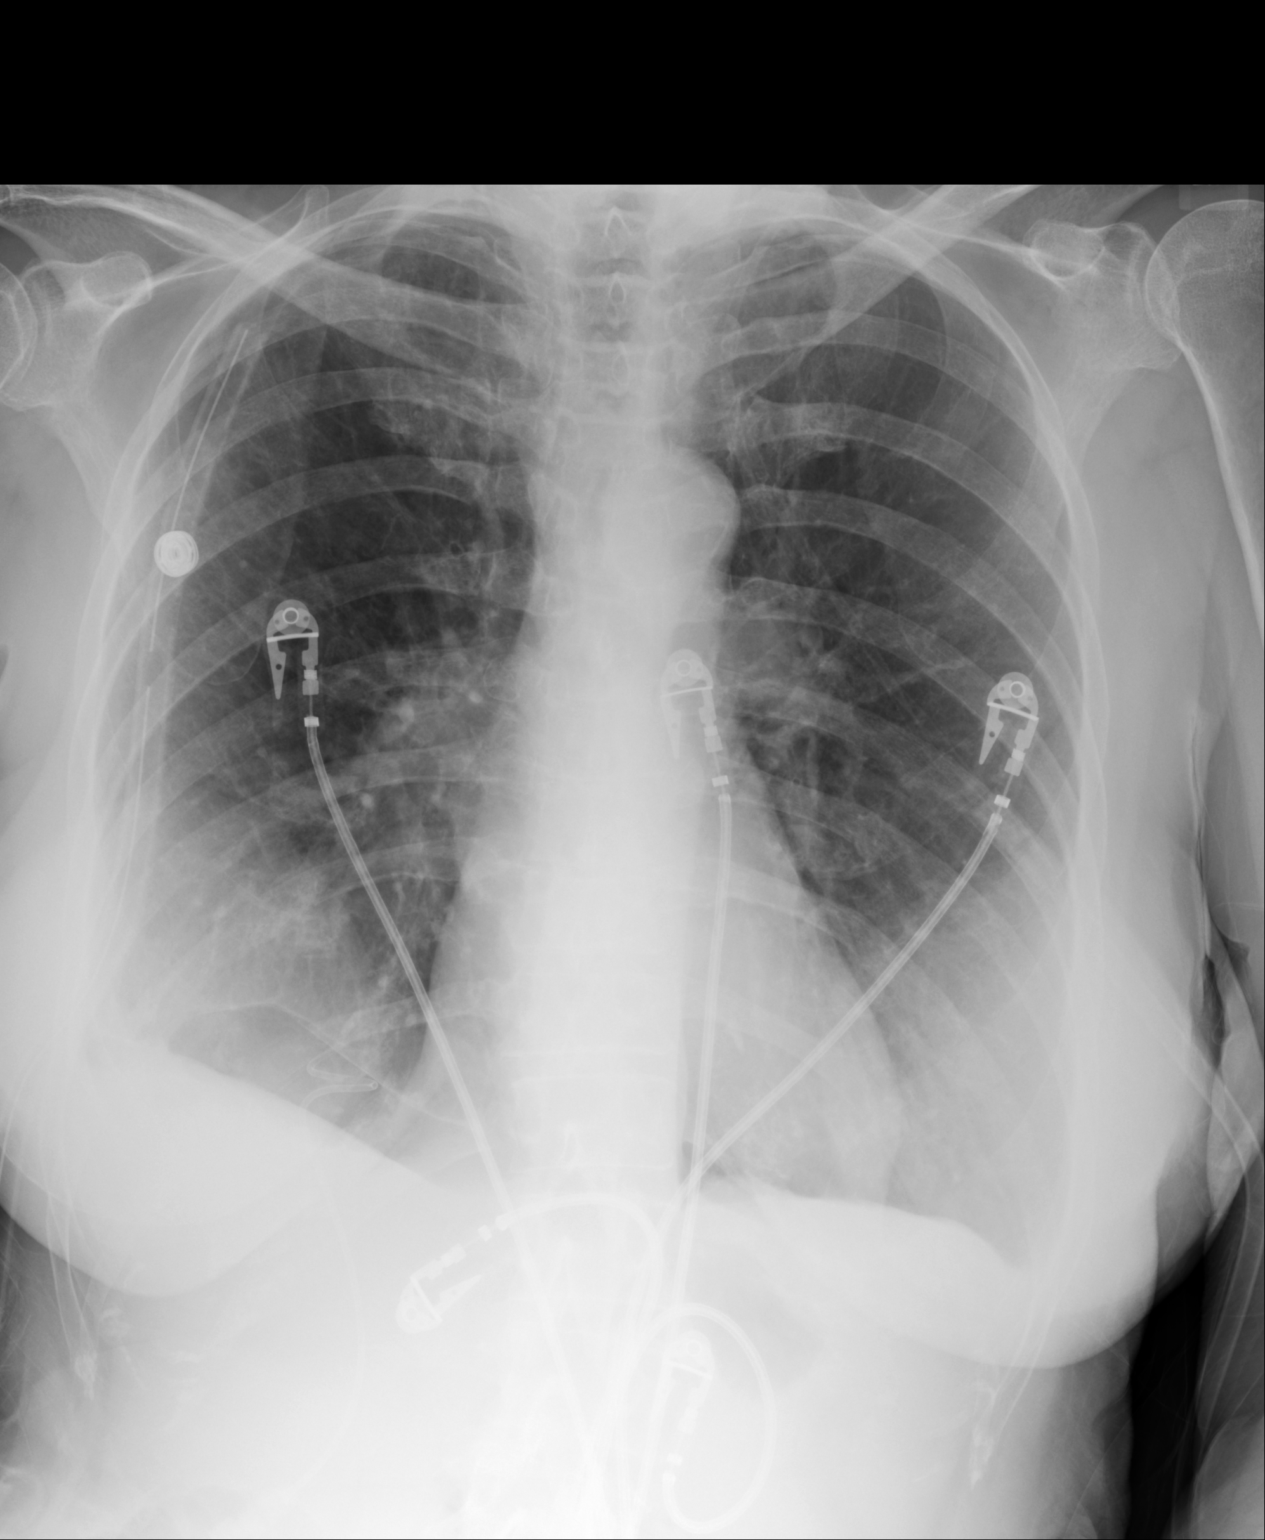

[1 of 1 positions shown; findings below may reference images not displayed]

PROCEDURE:          DXR PORTABLE CHEST SINGLE VIEW 05/31/2012 [DATE]

RESULT:

Comparison is made to prior study dated 05/30/2012.

A chest tube is once again identified on the right with tip projecting in
the region of the right lung apex. A smaller tube is identified within the
right lung base. A loculated focus of air projects within the right lung
base. There is mild prominence of the interstitial markings. No further
evidence of focal regions of consolidation or focal infiltrates are
appreciated. The cardiac silhouette and visualized bony skeleton are
unremarkable. There is no evidence of an appreciable pleural effusion.
IMPRESSION: 1. Residual loculated pneumothorax within the right lung base.

## 2013-09-12 IMAGING — CR DG CHEST 1V PORT
1 series · 2 of 2 positions shown · non-contrast
Comparison: none

REASON FOR EXAM: assess for pleural effusion
COMMENTS:

[Series 1: ap · 0.17mm/px · 2 of 2 slices shown]
[im 1/2]
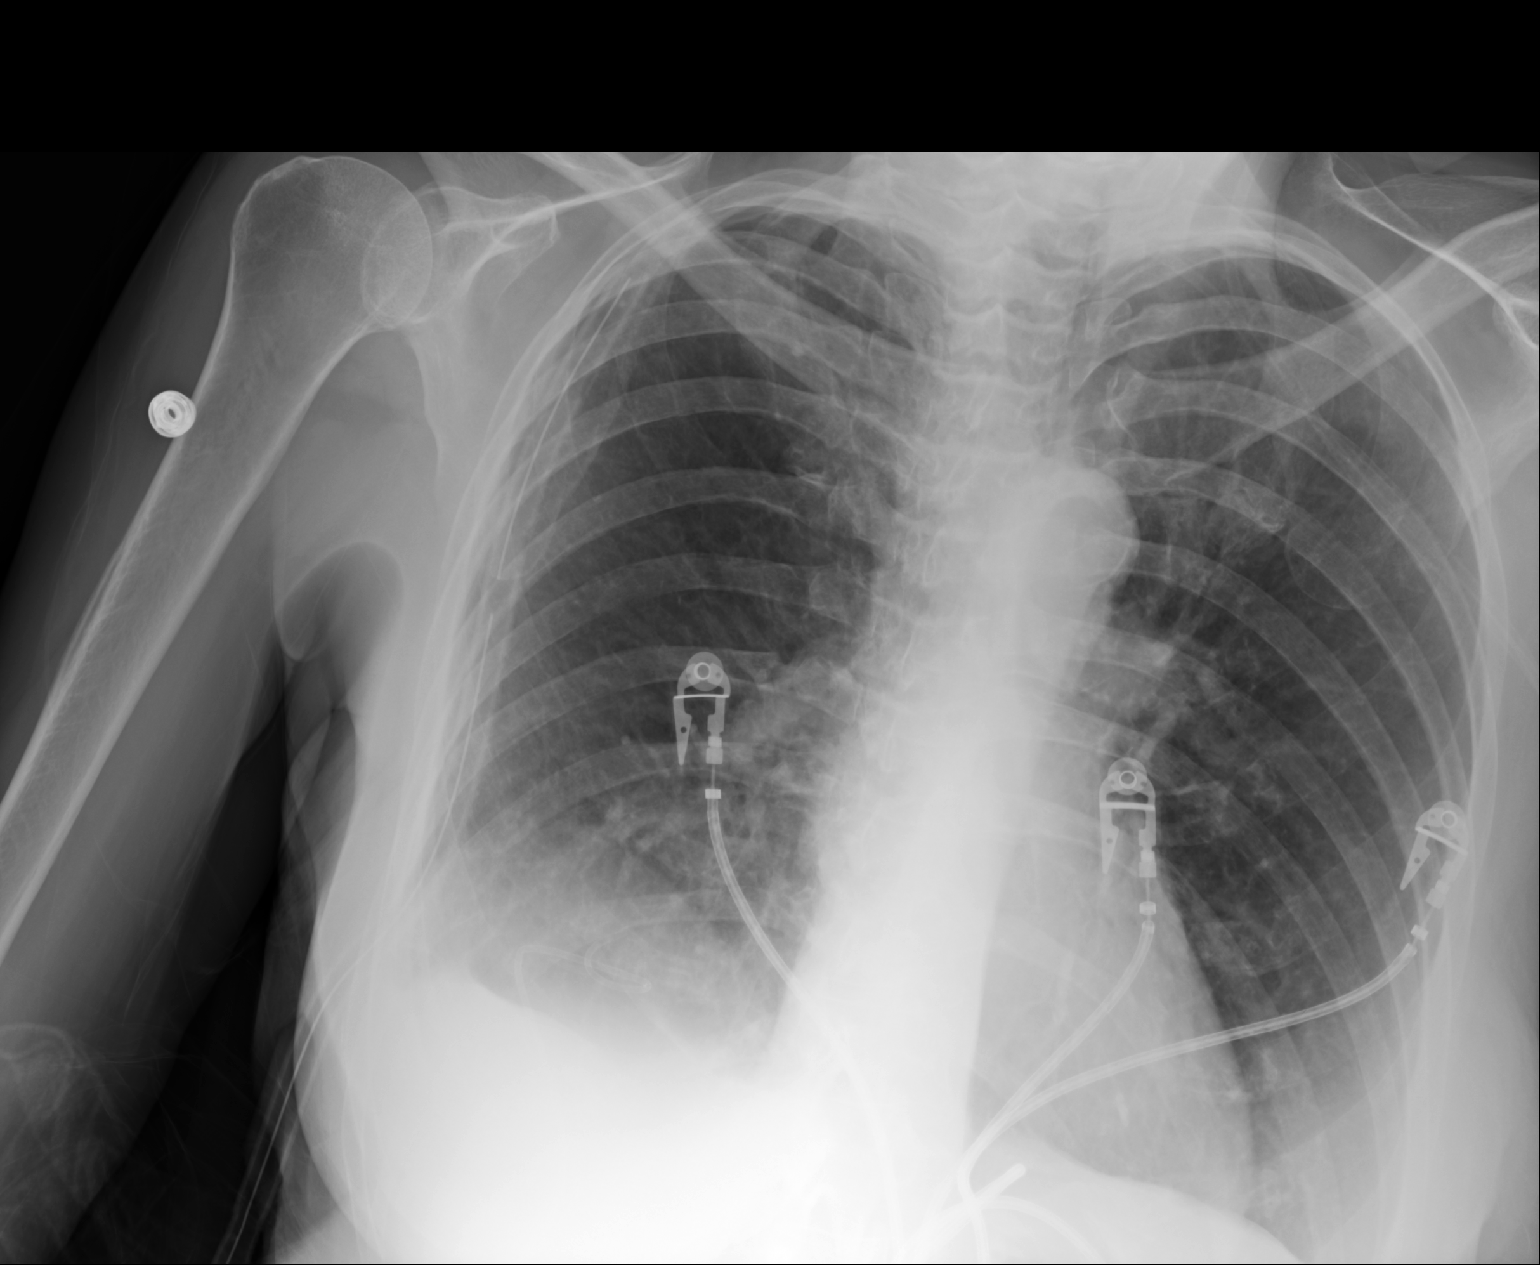
[im 2/2]
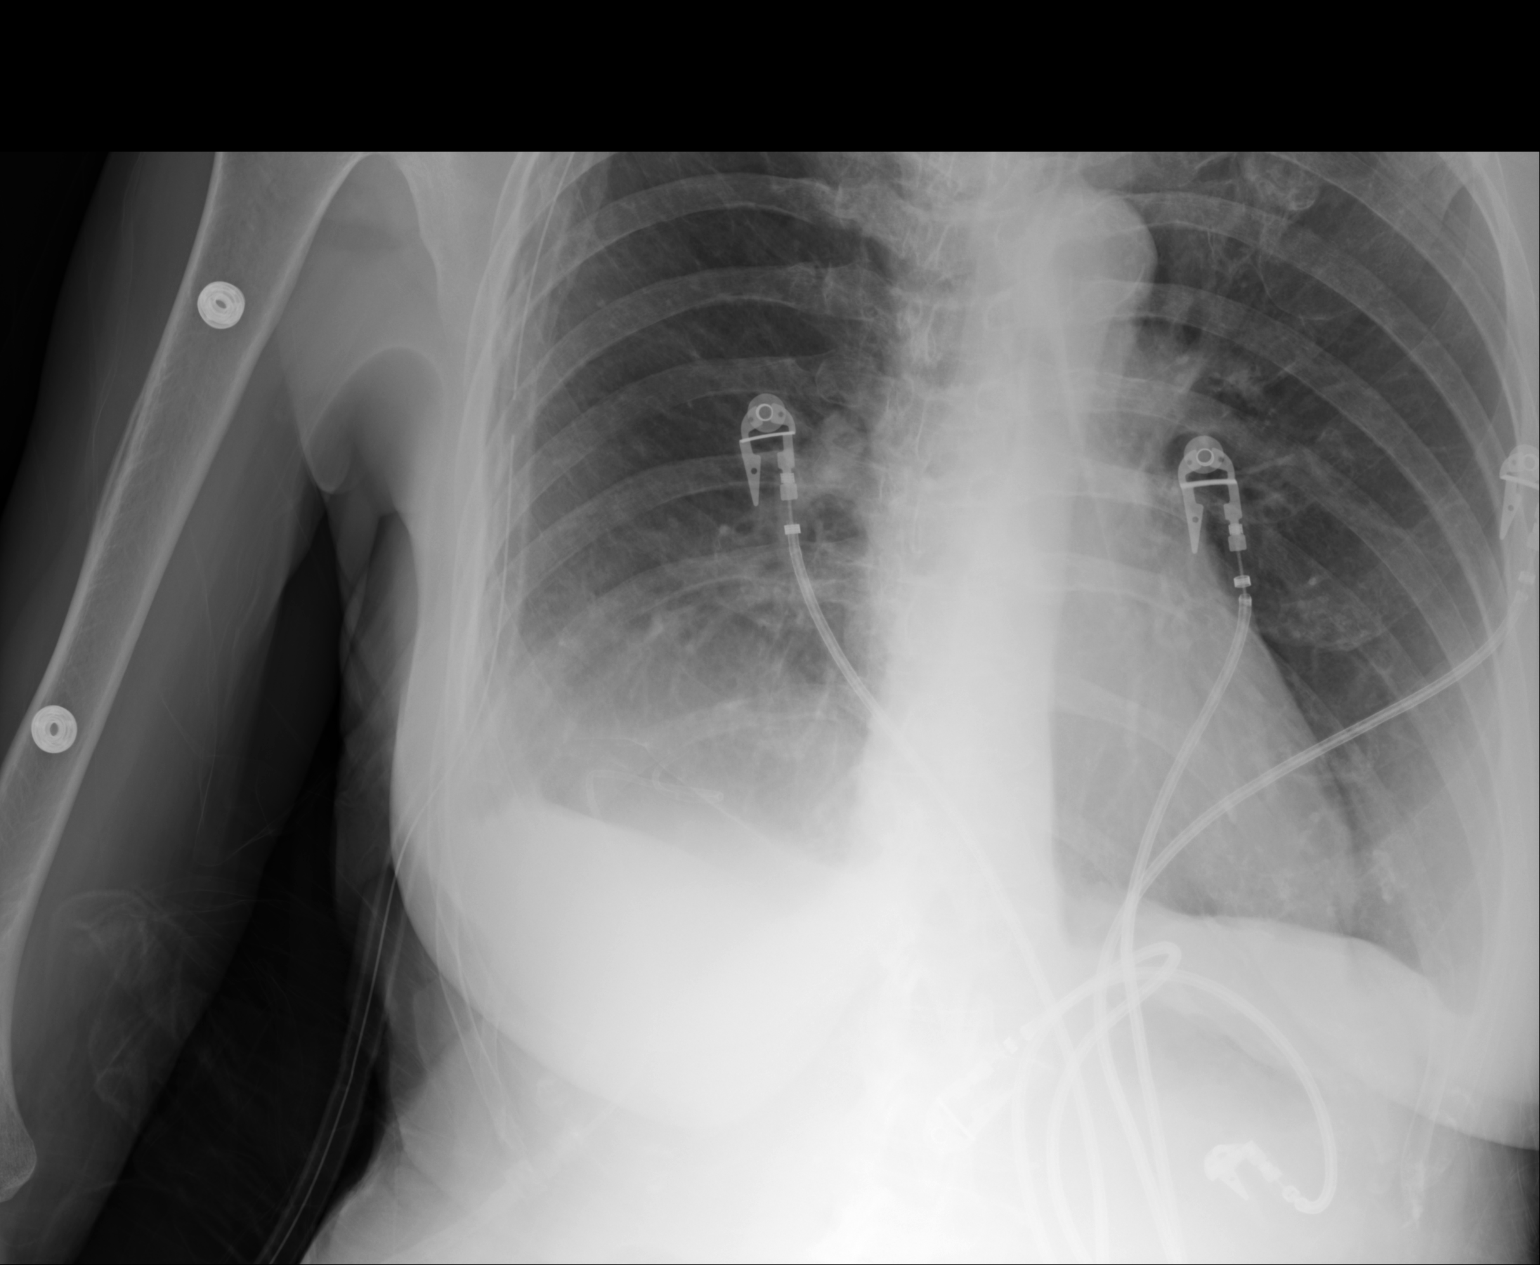

[2 of 2 positions shown; findings below may reference images not displayed]

PROCEDURE:     DXR - DXR PORTABLE CHEST SINGLE VIEW  - June 01, 2012 [DATE]

RESULT:     Comparison is made to prior study dated 05/31/2012.

A right-sided chest tube tip projects with tip in the region of the right
lung apex. A small-bore pigtail catheter is identified within the right lung
base. There appears to be decreased size of the loculated pneumothorax in
the right lung base. A small amount of increased density projects within the
right lung base. No further focal regions of consolidation or focal
infiltrates are appreciated. The cardiac silhouette and visualized bony
skeleton are unremarkable.
IMPRESSION: 1. Decreased size of loculated pneumothorax in the right lung base.
2. Atelectasis versus infiltrate as well as possibly a trace effusion right
lung base. Continued surveillance evaluation recommended.

## 2013-09-13 IMAGING — CR DG CHEST 1V PORT
1 series · 1 of 1 positions shown · non-contrast
Comparison: 04/29/2012.

CLINICAL DATA: Evaluate chest tubes.  Shortness of breath.

PORTABLE CHEST - 1 VIEW

[AP]
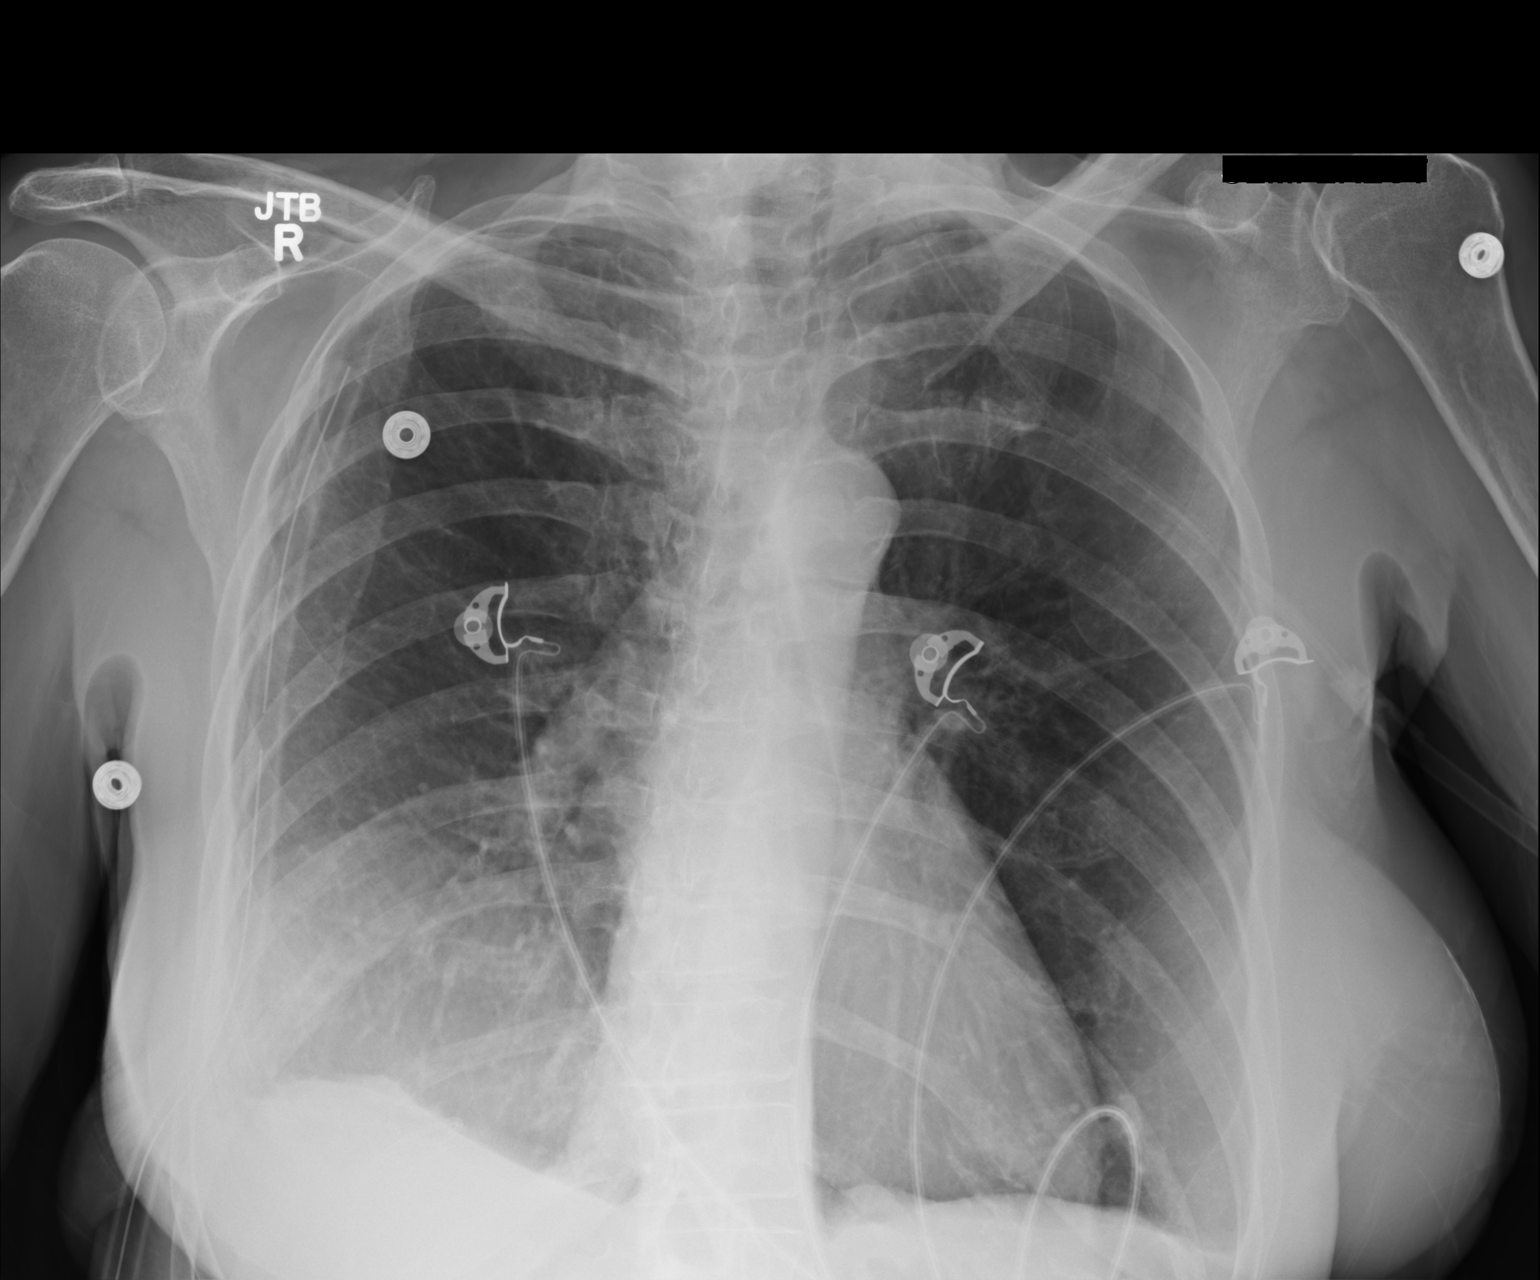

[1 of 1 positions shown; findings below may reference images not displayed]

FINDINGS: Trachea is midline.  Heart size normal.  Right chest tube
remains in place with tip near the apex. Probable tiny right apical
pneumothorax persists but is smaller than on the 04/29/2012.  There
is volume loss at the right lung base.  Left lung is clear.
IMPRESSION: Probable tiny residual right apical pneumothorax with right chest
tube in place.  Volume loss at the base of the right hemithorax

## 2013-09-13 IMAGING — CT CT CHEST SUPER D W/O CM
2 of 3 series · 15 of 36 positions shown, 18 images · non-contrast
Comparison: CT images obtained during lung biopsy 04/29/2012.
Abdominal CT 06/06/2007.

CLINICAL DATA: Right upper lobe lung mass.  Persistent
pneumothorax.  Chest pain and shortness of breath.  Needle core
biopsy revealed necrotizing granulomatous inflammation.

CT CHEST WITHOUT CONTRAST
TECHNIQUE: Multidetector CT imaging of the chest was performed
using thin slice collimation for electromagnetic bronchoscopy
planning purposes, without intravenous contrast.

[Series 2: super d chest 5.0 st · axial · 0.70mm/px · z∈[+1287,+1532]mm · 12 of 59 slices shown, 15 images]
[im 5/59  mediastinal]
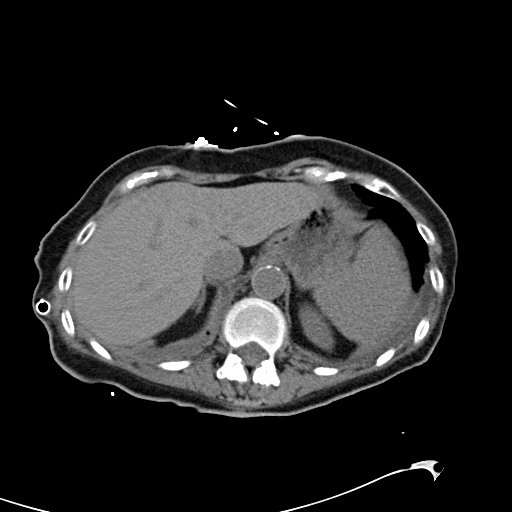
[im 5/59  lung]
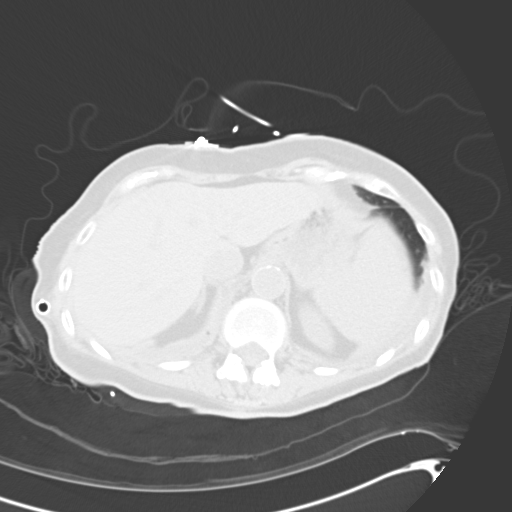
[im 9/59  lung]
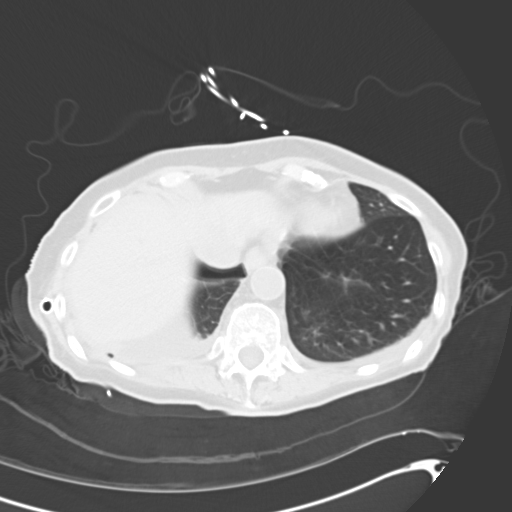
[im 13/59  lung]
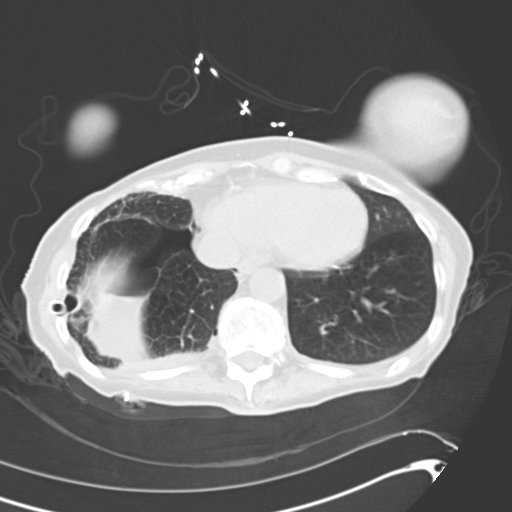
[im 18/59  lung]
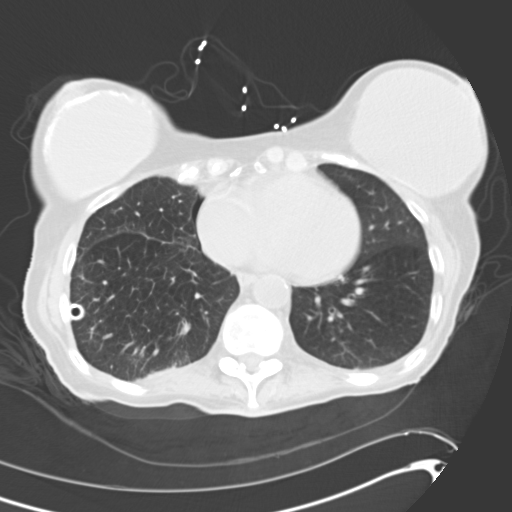
[im 22/59  mediastinal]
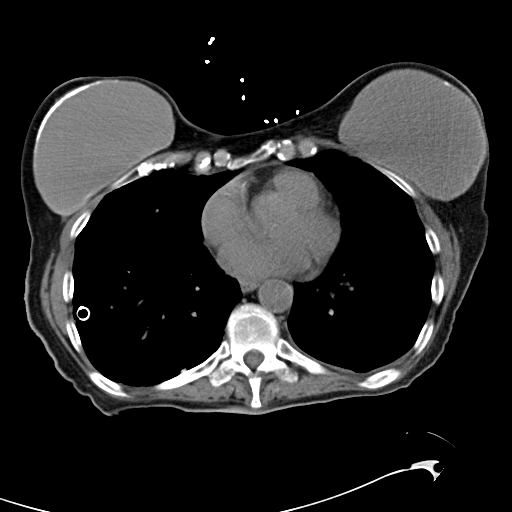
[im 22/59  lung]
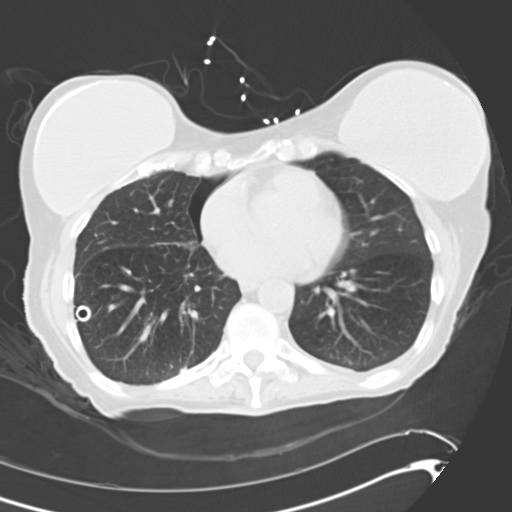
[im 26/59  lung]
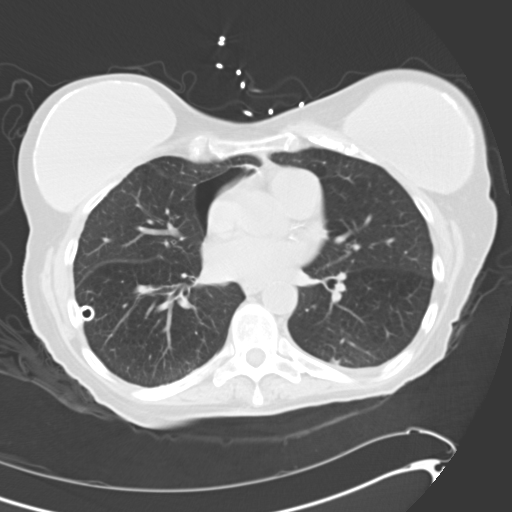
[im 33/59  lung]
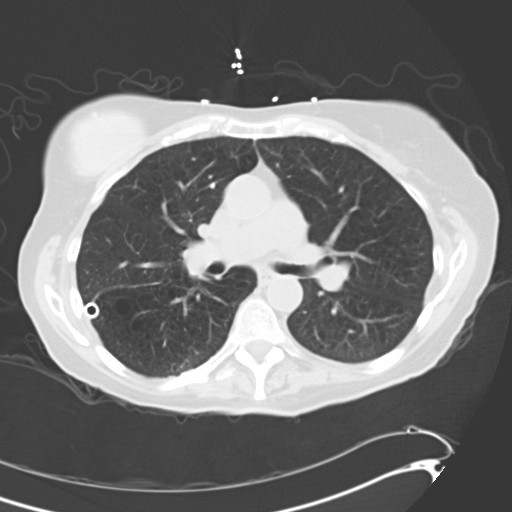
[im 37/59  lung]
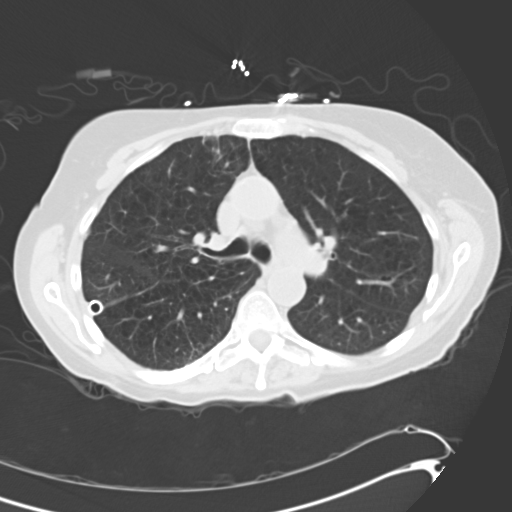
[im 41/59  mediastinal]
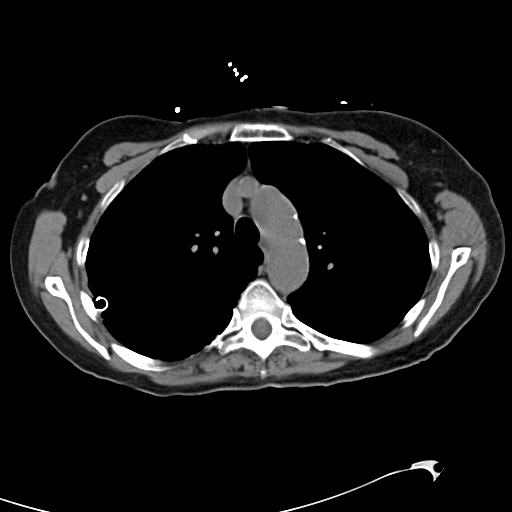
[im 41/59  lung]
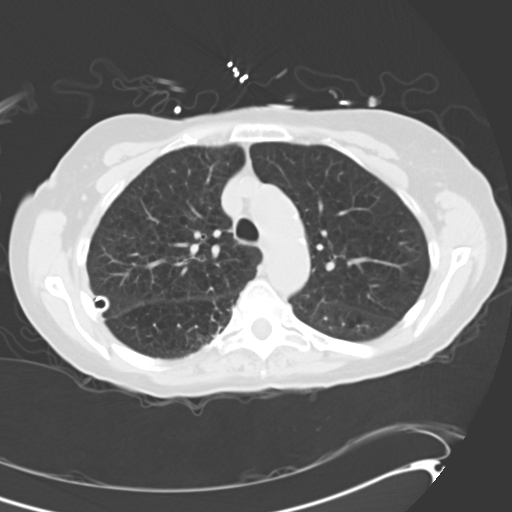
[im 46/59  lung]
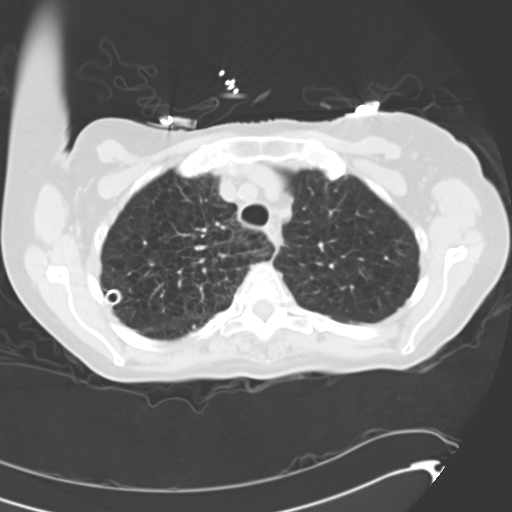
[im 50/59  lung]
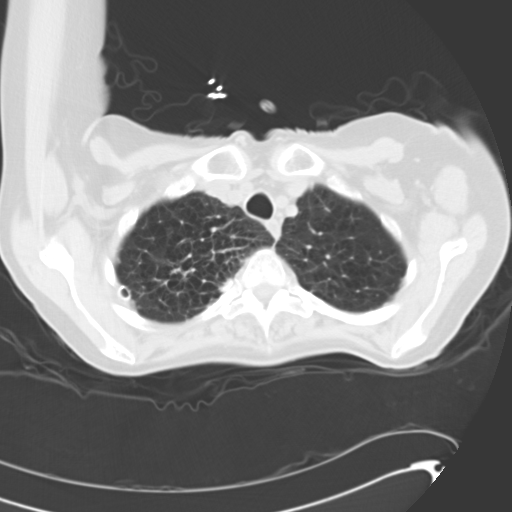
[im 54/59  lung]
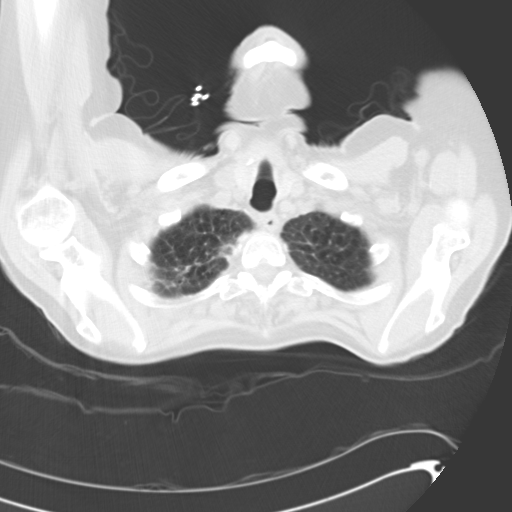

[Series 5: coronal · coronal · 0.60mm/px · 3 of 95 slices shown]
[im 19/95  lung]
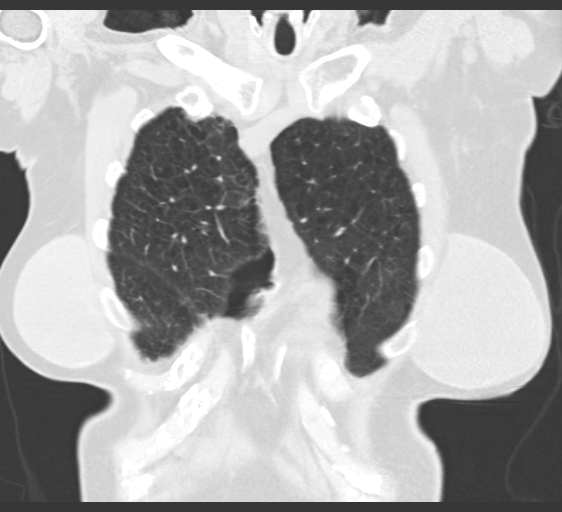
[im 38/95  lung]
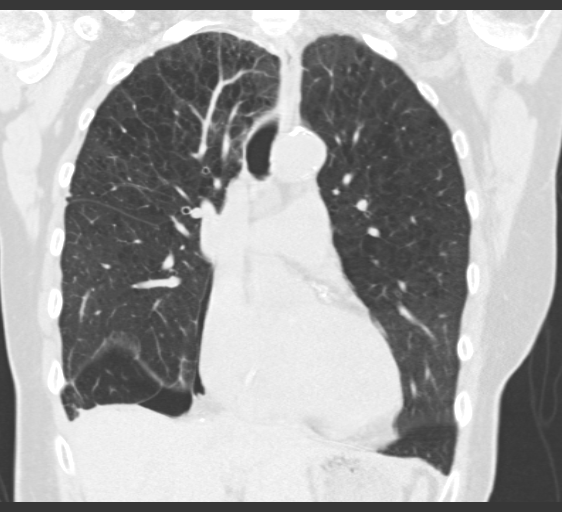
[im 57/95  lung]
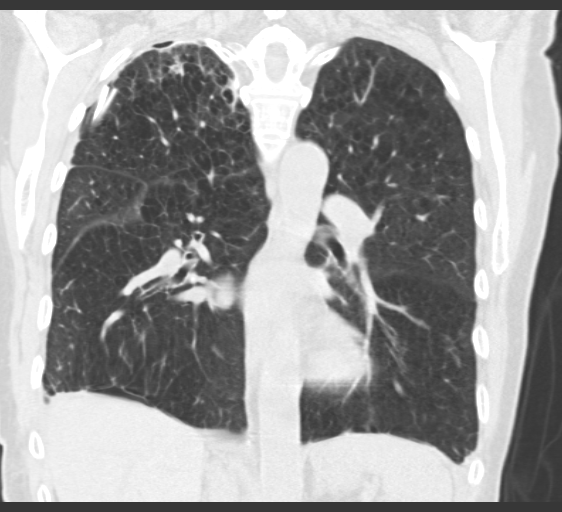

[15 of 36 positions shown; findings below may reference images not displayed]

FINDINGS: Right chest tube is in place within the superior right
pleural space.  There is a small right-sided pneumothorax with
basilar and medial components.  There is a small right pleural
effusion.  There is no left pleural or pericardial effusion.

No enlarged mediastinal or hilar lymph nodes are identified.  There
is mild diffuse atherosclerosis of the aorta, great vessels and
coronary arteries.  Bilateral breast implants are noted.

The lungs demonstrate diffuse emphysematous changes.  The
previously demonstrated cavitary lesion of the right apex appears
smaller with thinner walls.  This measures 1.6 x 1.4 cm on image
#8.  There is no significant surrounding hemorrhage.  No other
suspicious pulmonary nodules are identified.

The visualized upper abdomen appears unremarkable.
IMPRESSION: 1.  Small right-sided hydropneumothorax.  Right chest tube remains
in place.
2.  The previously biopsied cavitary right apical lesion appears
smaller and less dense, supportive of a benign/infectious etiology.
CT followup recommended.

## 2013-09-14 IMAGING — CR DG CHEST 1V PORT
2 series · 2 of 2 positions shown · non-contrast
Comparison: Chest x-ray 06/02/2012.

CLINICAL DATA: Evaluate chest tube placement.

PORTABLE CHEST - 1 VIEW

[AP (1 of 2)]
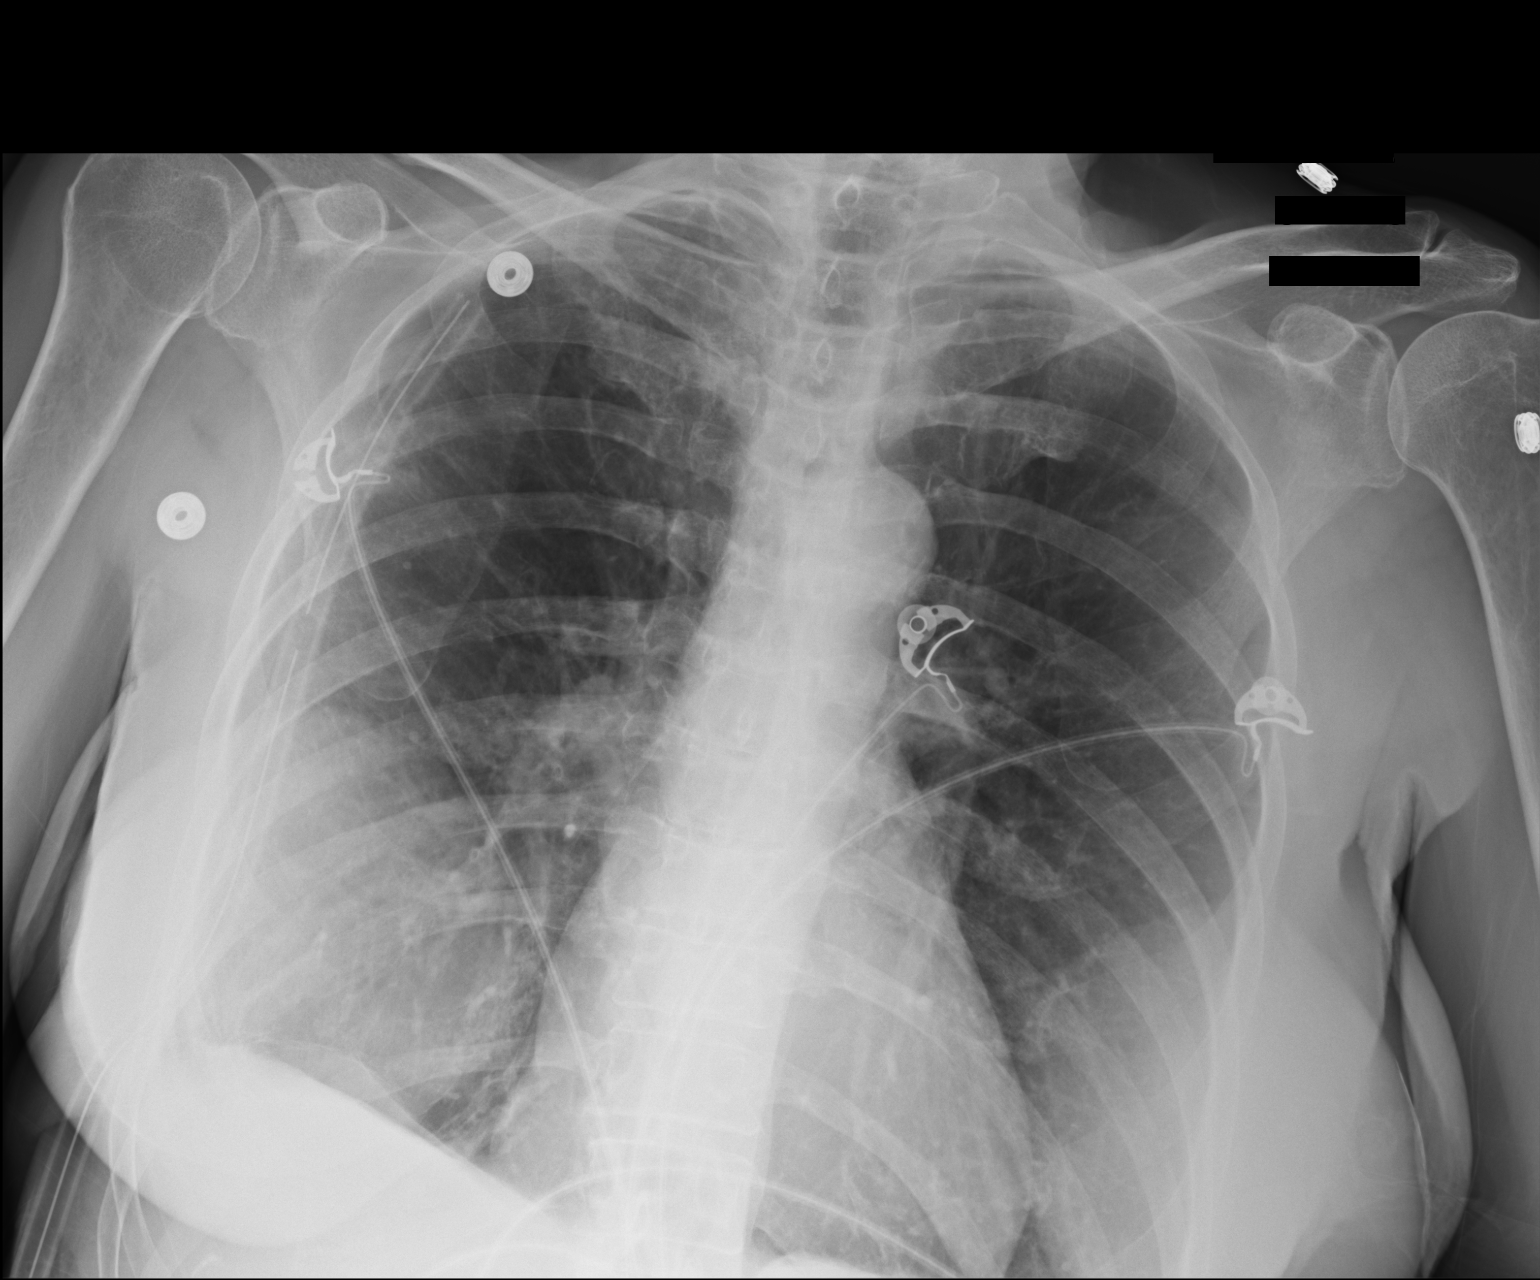

[AP (2 of 2)]
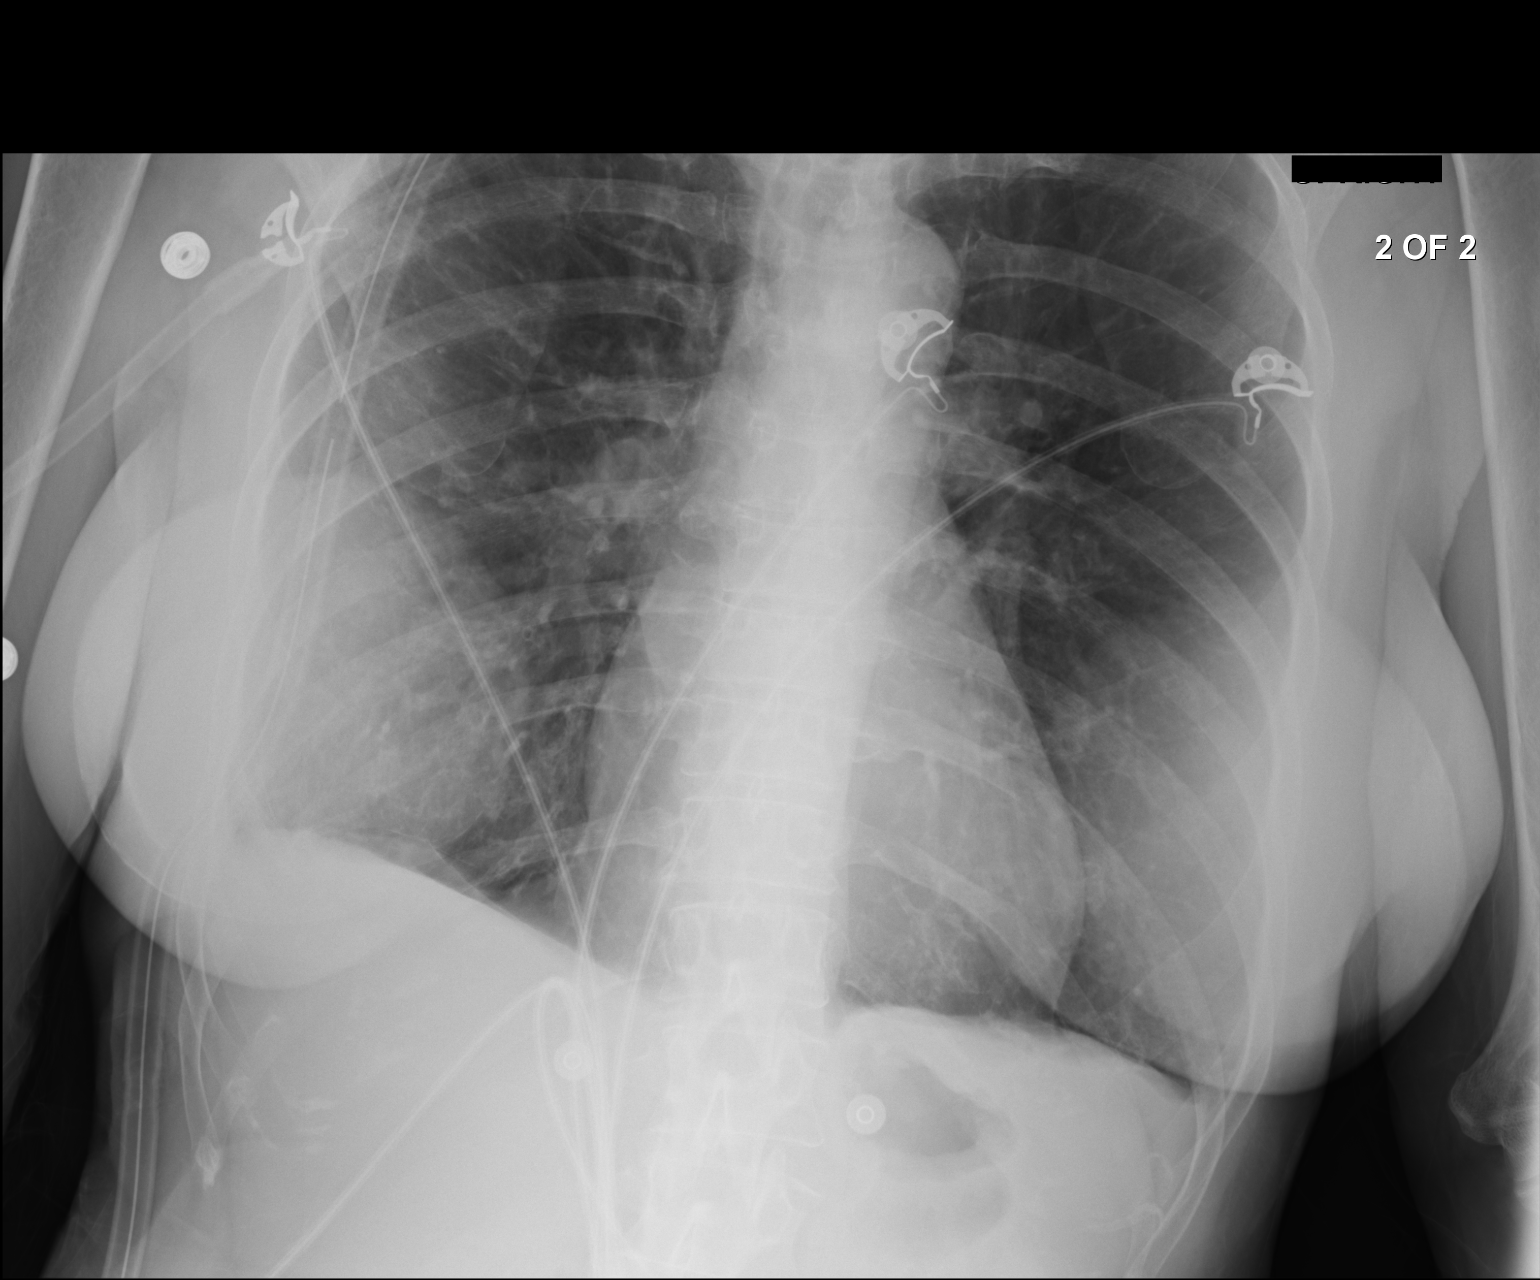

[2 of 2 positions shown; findings below may reference images not displayed]

FINDINGS: Right-sided chest tube in position with tip near the apex
of the right hemithorax and side port projecting over the lateral
right hemithorax.  A lucency at the right base may suggest a trace
right basilar pneumothorax, but no large significant size
pneumothorax is otherwise noted.  Lungs are essentially clear,
without evidence of acute consolidative airspace disease.
Effusions.  No evidence of pulmonary edema.  Heart size and
mediastinal contours are unremarkable.  Atherosclerosis in the
thoracic aorta. Bilateral breast implants are noted.
IMPRESSION: 1.  Persistent small right basilar pneumothorax with right-sided
chest tube in place, unchanged compared to yesterday's CT
examination.
2.  Atherosclerosis.

## 2013-09-15 DIAGNOSIS — Z9889 Other specified postprocedural states: Secondary | ICD-10-CM | POA: Insufficient documentation

## 2013-09-15 IMAGING — CR DG CHEST 1V PORT
1 series · 1 of 1 positions shown · non-contrast
Comparison: 05/06/2012; 06/02/2012; head CT - three/09/16/2012

CLINICAL DATA: Postop, chest tube

PORTABLE CHEST - 1 VIEW

[AP]
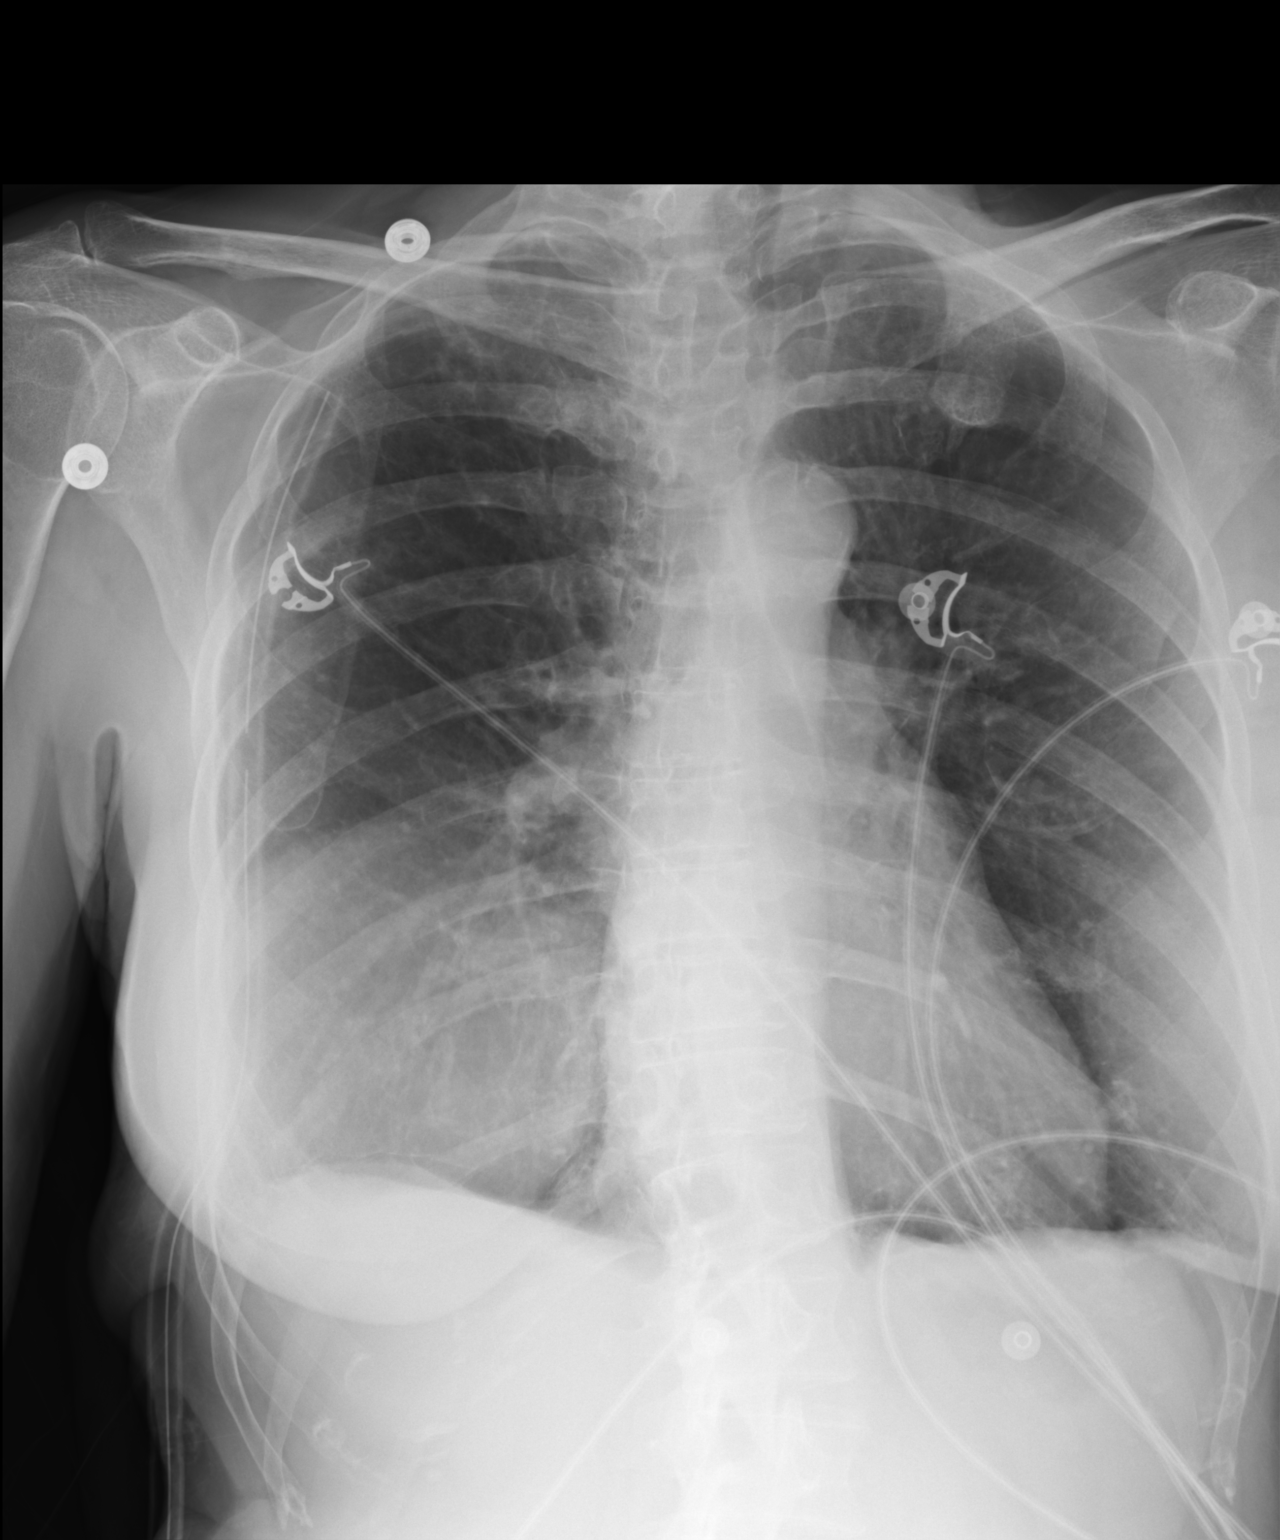

[1 of 1 positions shown; findings below may reference images not displayed]

FINDINGS: Unchanged cardiac silhouette and mediastinal contours.  Stable
positioning of support apparatus.  Unchanged tiny right basilar
hydropneumothorax.  The lungs remain hyperexpanded with flattening
of bilateral hemidiaphragms.  No new focal airspace opacities.
Post bilateral breast augmentation.  Unchanged bones.
IMPRESSION: Stable positioning of support apparatus with unchanged tiny right
basilar hydropneumothorax.

## 2013-09-16 IMAGING — CR DG CHEST 1V PORT
1 series · 1 of 1 positions shown · non-contrast
Comparison: 06/04/2012; 06/03/2012; 06/02/2012; chest CT -
06/02/2012

CLINICAL DATA: Evaluate right basilar pneumothorax

PORTABLE CHEST - 1 VIEW

[AP]
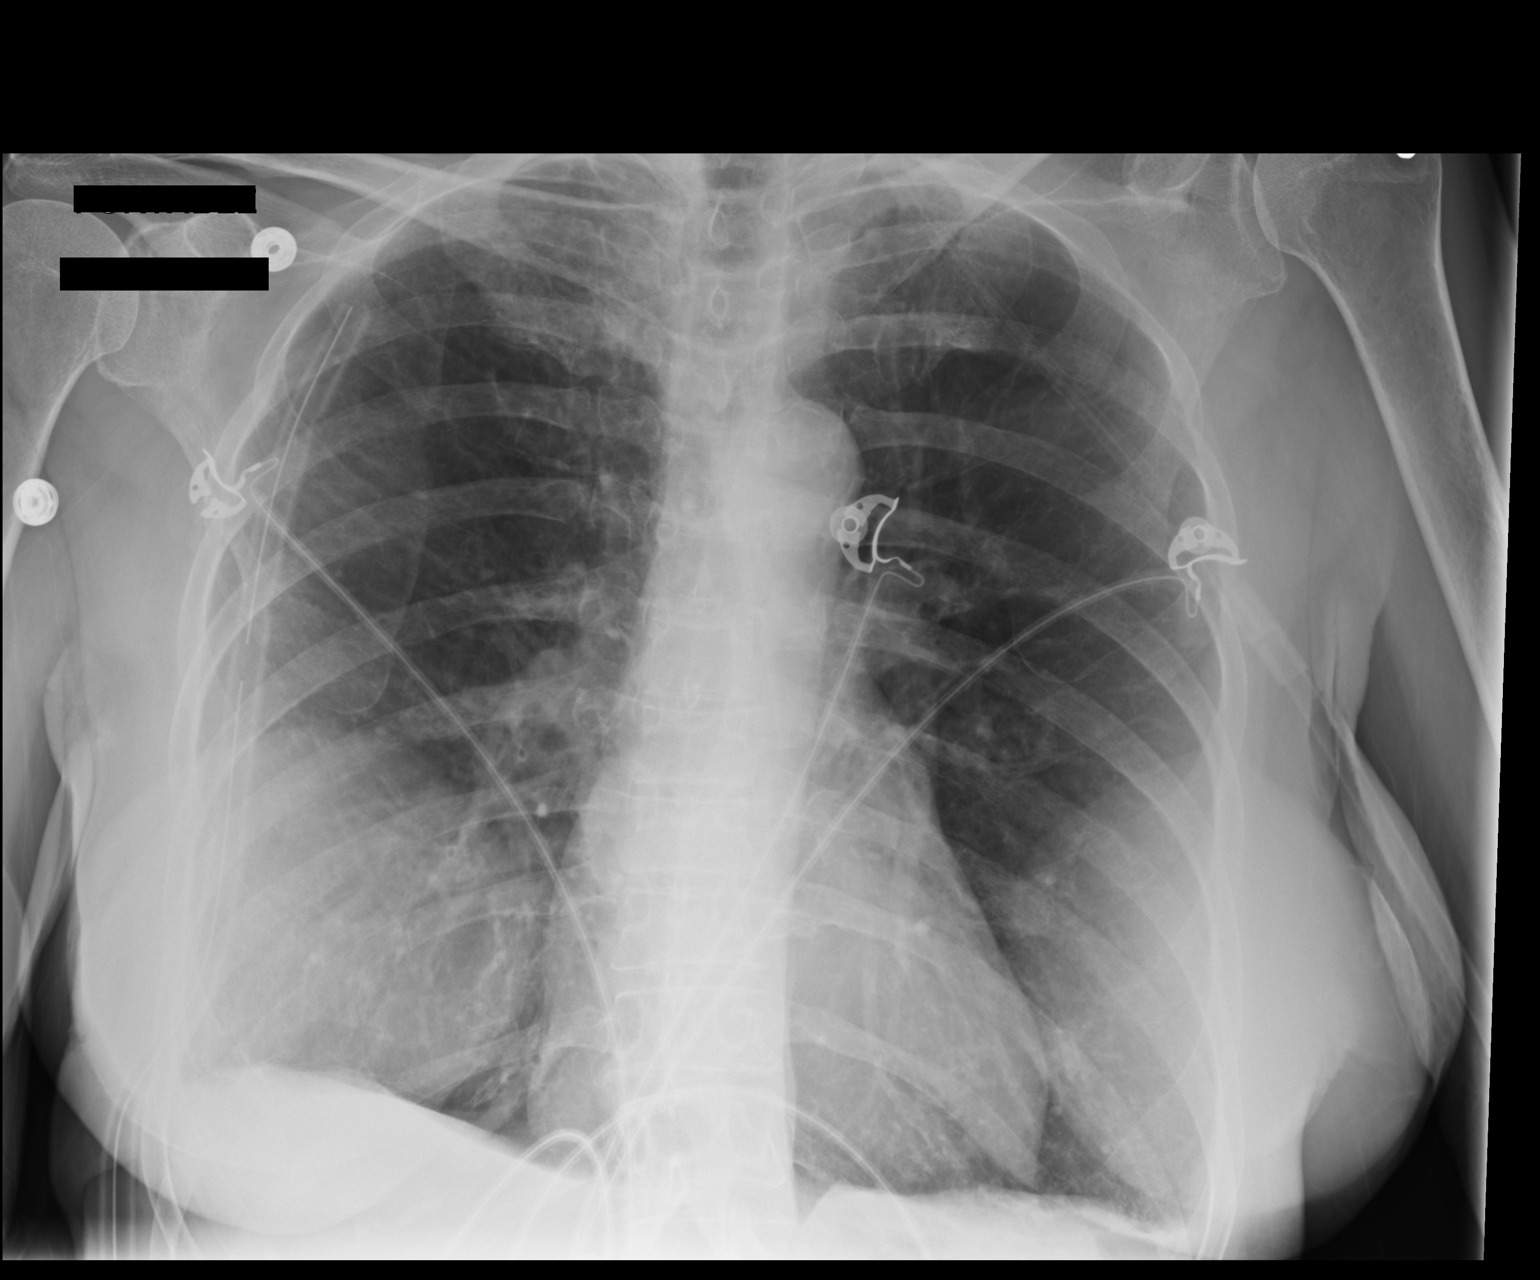

[1 of 1 positions shown; findings below may reference images not displayed]

FINDINGS: Grossly unchanged cardiac silhouette and mediastinal
contours.  Stable position of support apparatus.  Grossly unchanged
tiny right basilar hydropneumothorax.  There is unchanged blunting
left costophrenic angle without definite left-sided pleural
effusion.  No new focal airspace opacity.  Post bilateral breast
augmentation.  Unchanged bones.
IMPRESSION: Stable positioning of support apparatus with unchanged tiny right
basilar hydropneumothorax.

## 2013-09-17 IMAGING — CR DG CHEST 1V PORT
1 series · 1 of 1 positions shown · non-contrast
Comparison: Multiple priors, most recently 06/05/2012.

CLINICAL DATA: Basilar pneumothorax.

PORTABLE CHEST - 1 VIEW

[AP]
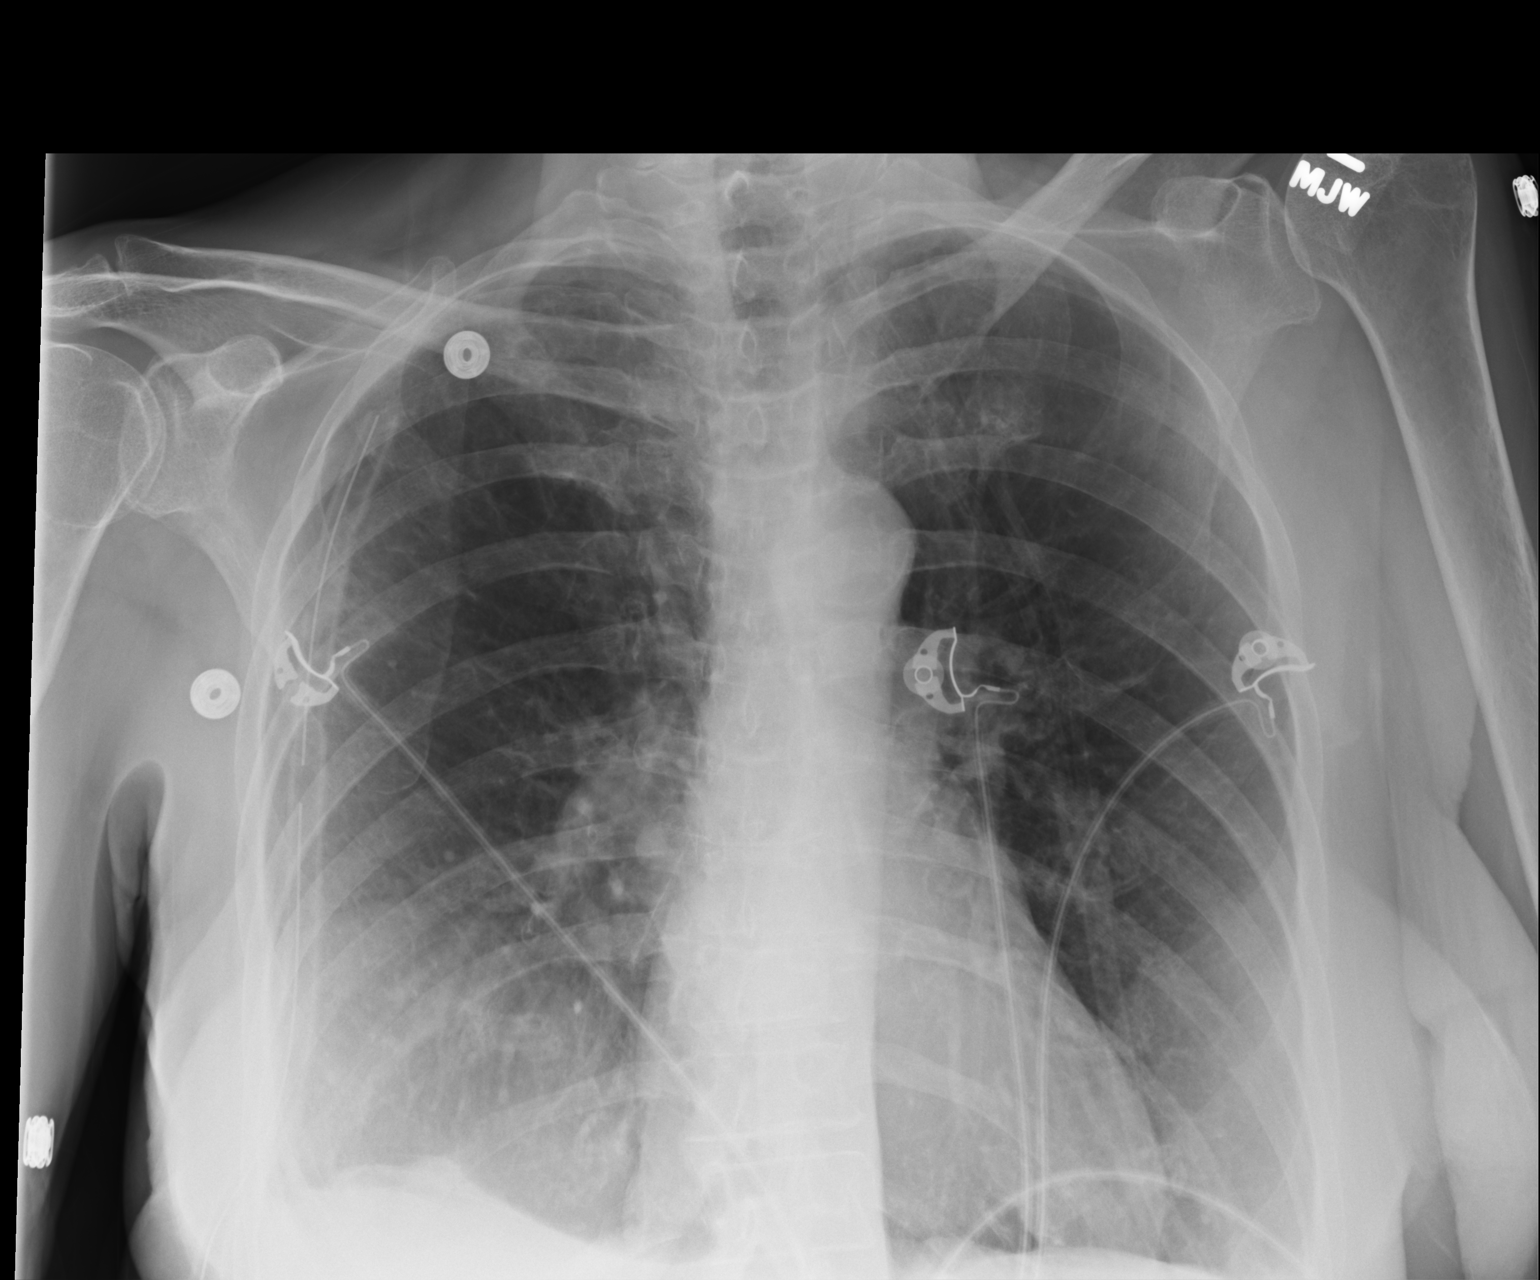

[1 of 1 positions shown; findings below may reference images not displayed]

FINDINGS: Right-sided chest tube remains in position with tip near
the right apex and sideport projecting over the right mid
hemithorax.  There is again blunting of the right costophrenic
sulcus, compatible with some right-sided pleural fluid.  There may
be very subtle lucency at the base of the right hemithorax, which
could be indicative of a persistent amount of pleural air.  If so,
this has decreased compared to recent prior examinations.  No
consolidative airspace disease.  Heart size is normal.  Pulmonary
vasculature is within normal limits.  Atherosclerosis of the
thoracic aorta.
IMPRESSION: 1.  Slight improvement in small right-sided hydropneumothorax.
Chest tube is unchanged in position.
2.  Atherosclerosis.

## 2013-09-18 IMAGING — CR DG CHEST 2V
2 series · 2 of 2 positions shown · non-contrast
Comparison: June 06, 2012.

CLINICAL DATA: Chest tube removal.

CHEST - 2 VIEW

[w chest lat]
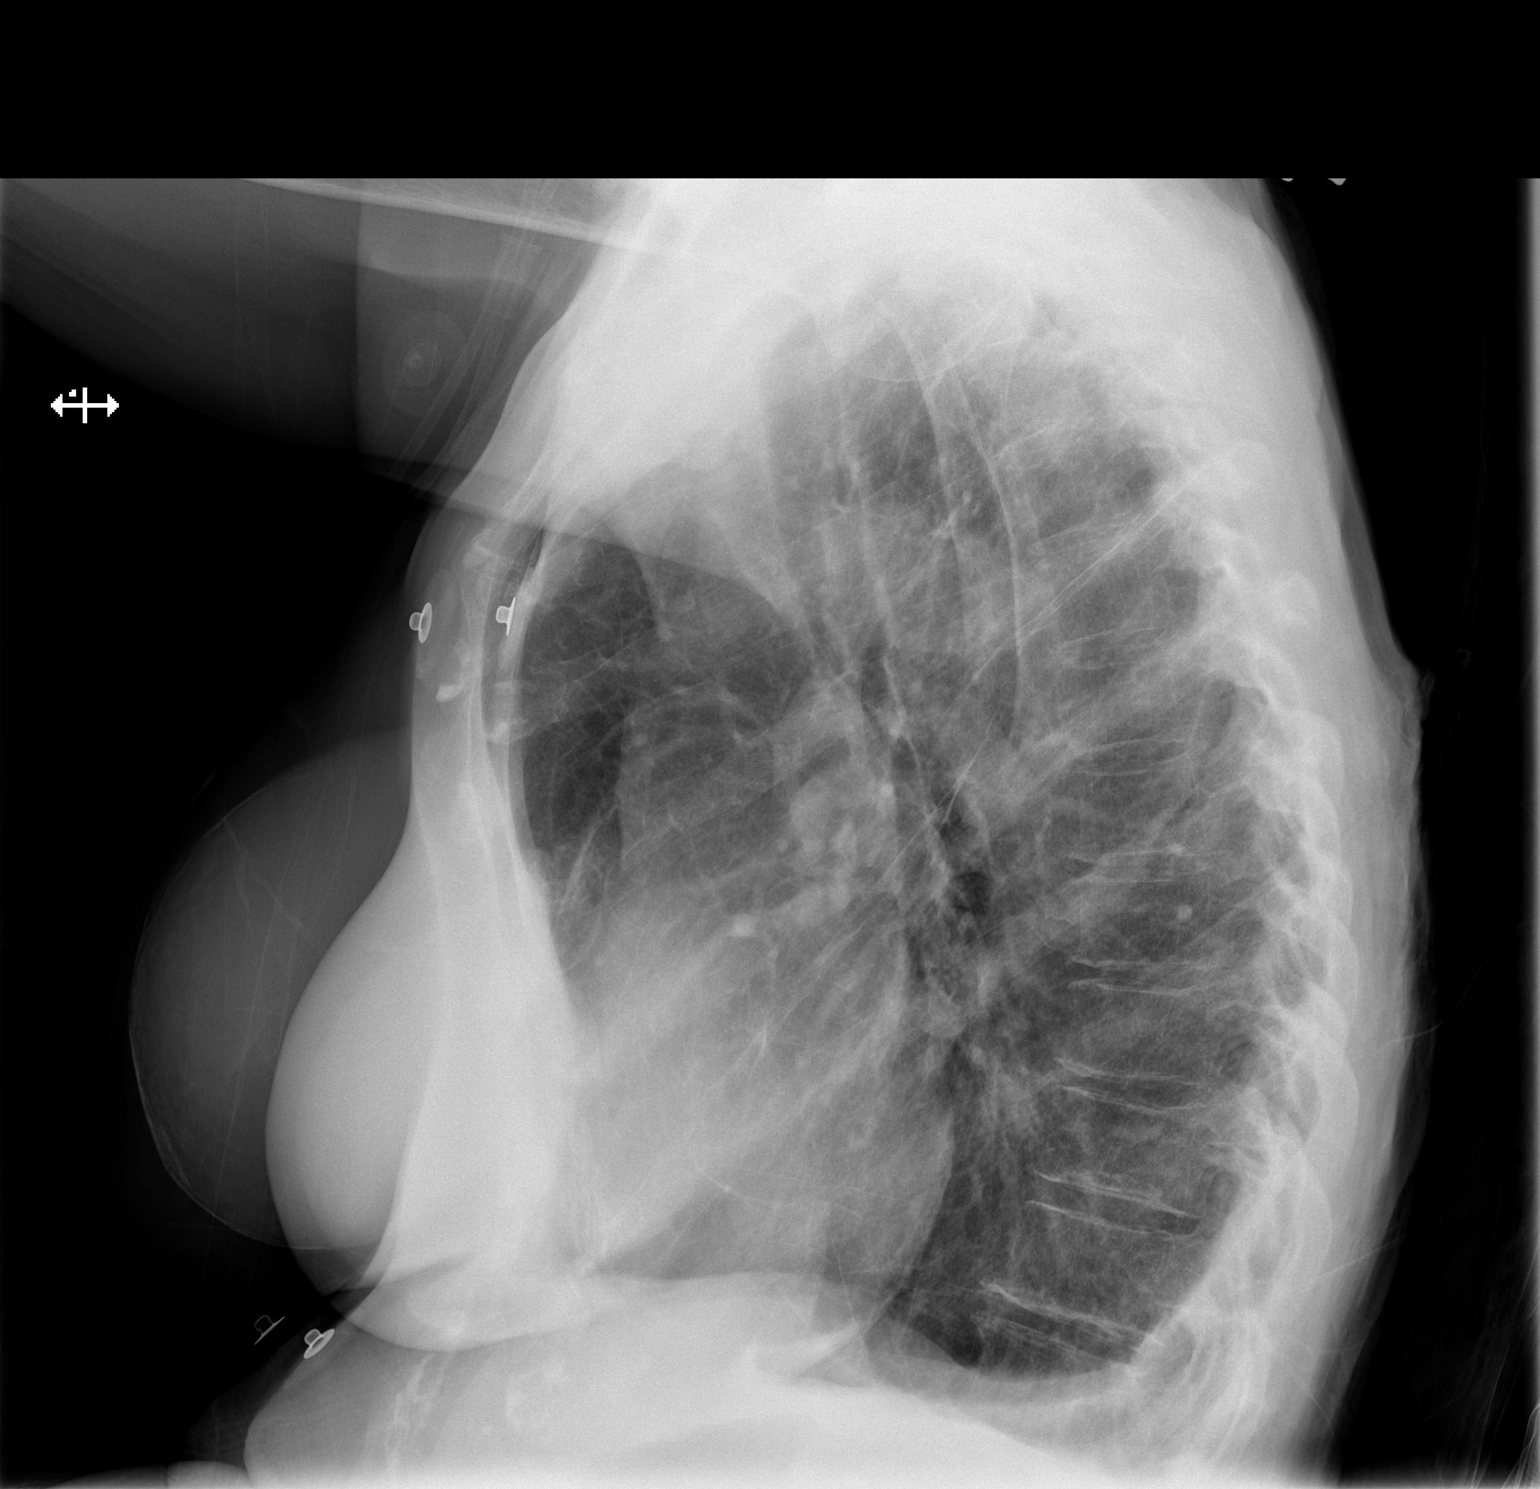

[x chest ap]
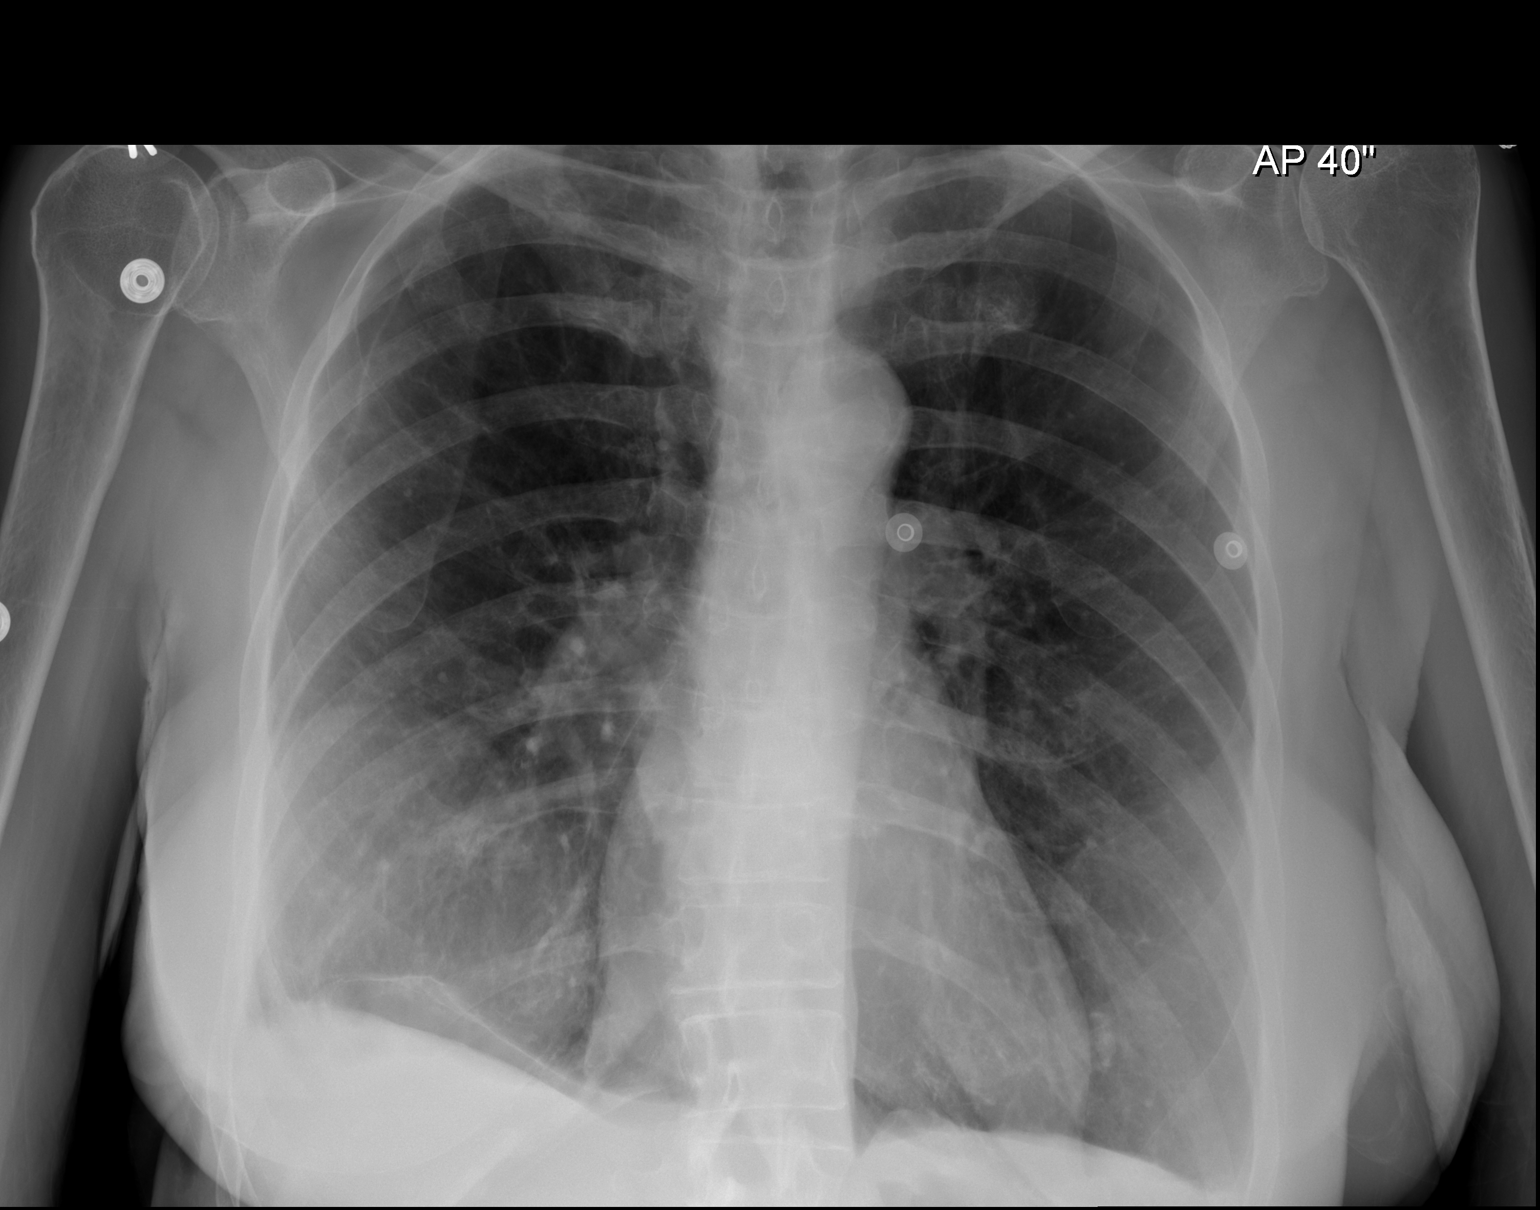

[2 of 2 positions shown; findings below may reference images not displayed]

FINDINGS: Cardiomediastinal silhouette appears normal.  Left lung
is clear.  Blunting of costophrenic angles is noted bilaterally
most consistent with scarring.  No change is noted involving
minimal right basilar pneumothorax compared to prior exam.
Curvilinear density seen laterally in right upper lobe is noted
again and may represent either old chest tube track or some degree
of pneumothorax is well.  It is unchanged.
IMPRESSION: No significant change compared to prior exam in terms of right-
sided pneumothorax.

## 2013-09-19 IMAGING — CR DG CHEST 2V
2 series · 2 of 2 positions shown · non-contrast
Comparison: [DATE] and 06/06/2012

CLINICAL DATA: Right pneumothorax.

CHEST - 2 VIEW

[w chest pa]
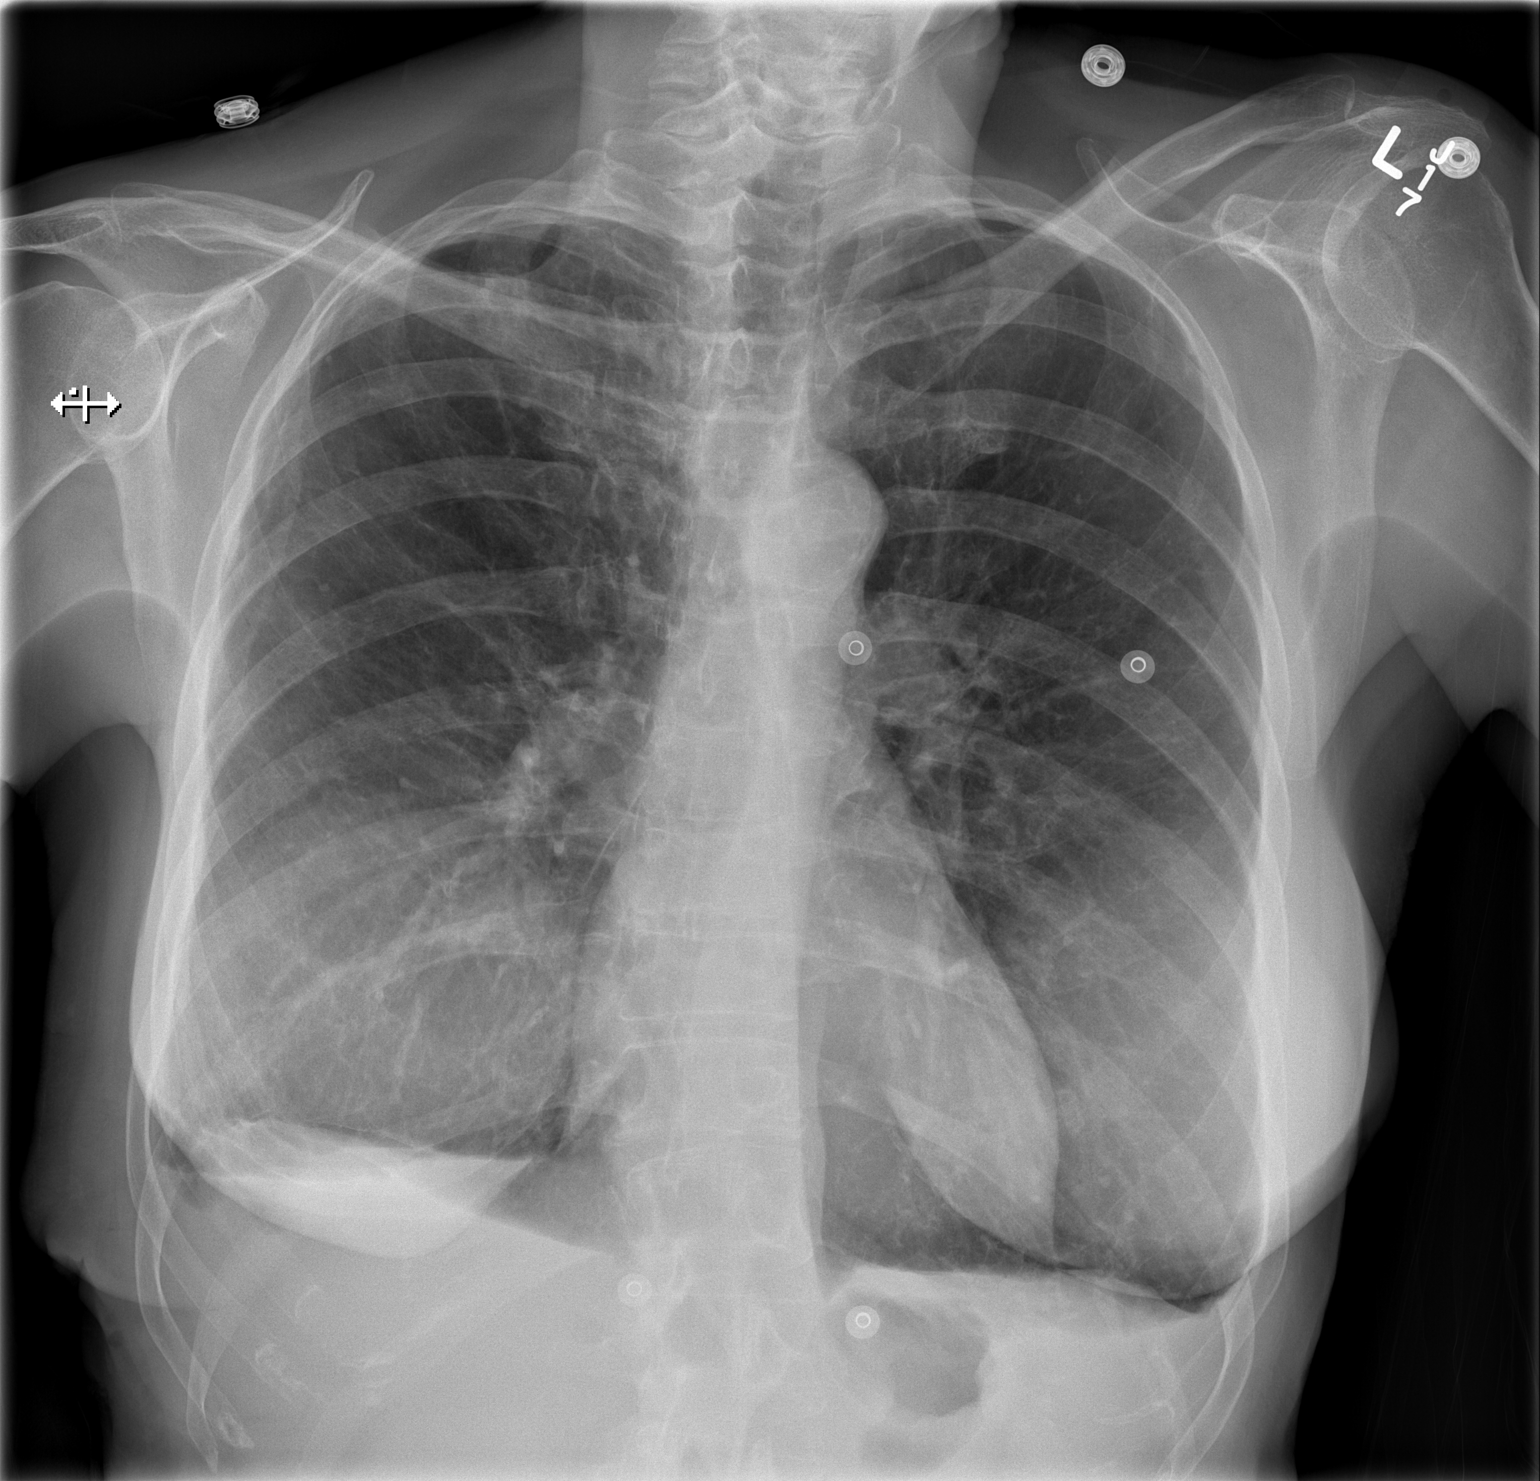

[w chest lat]
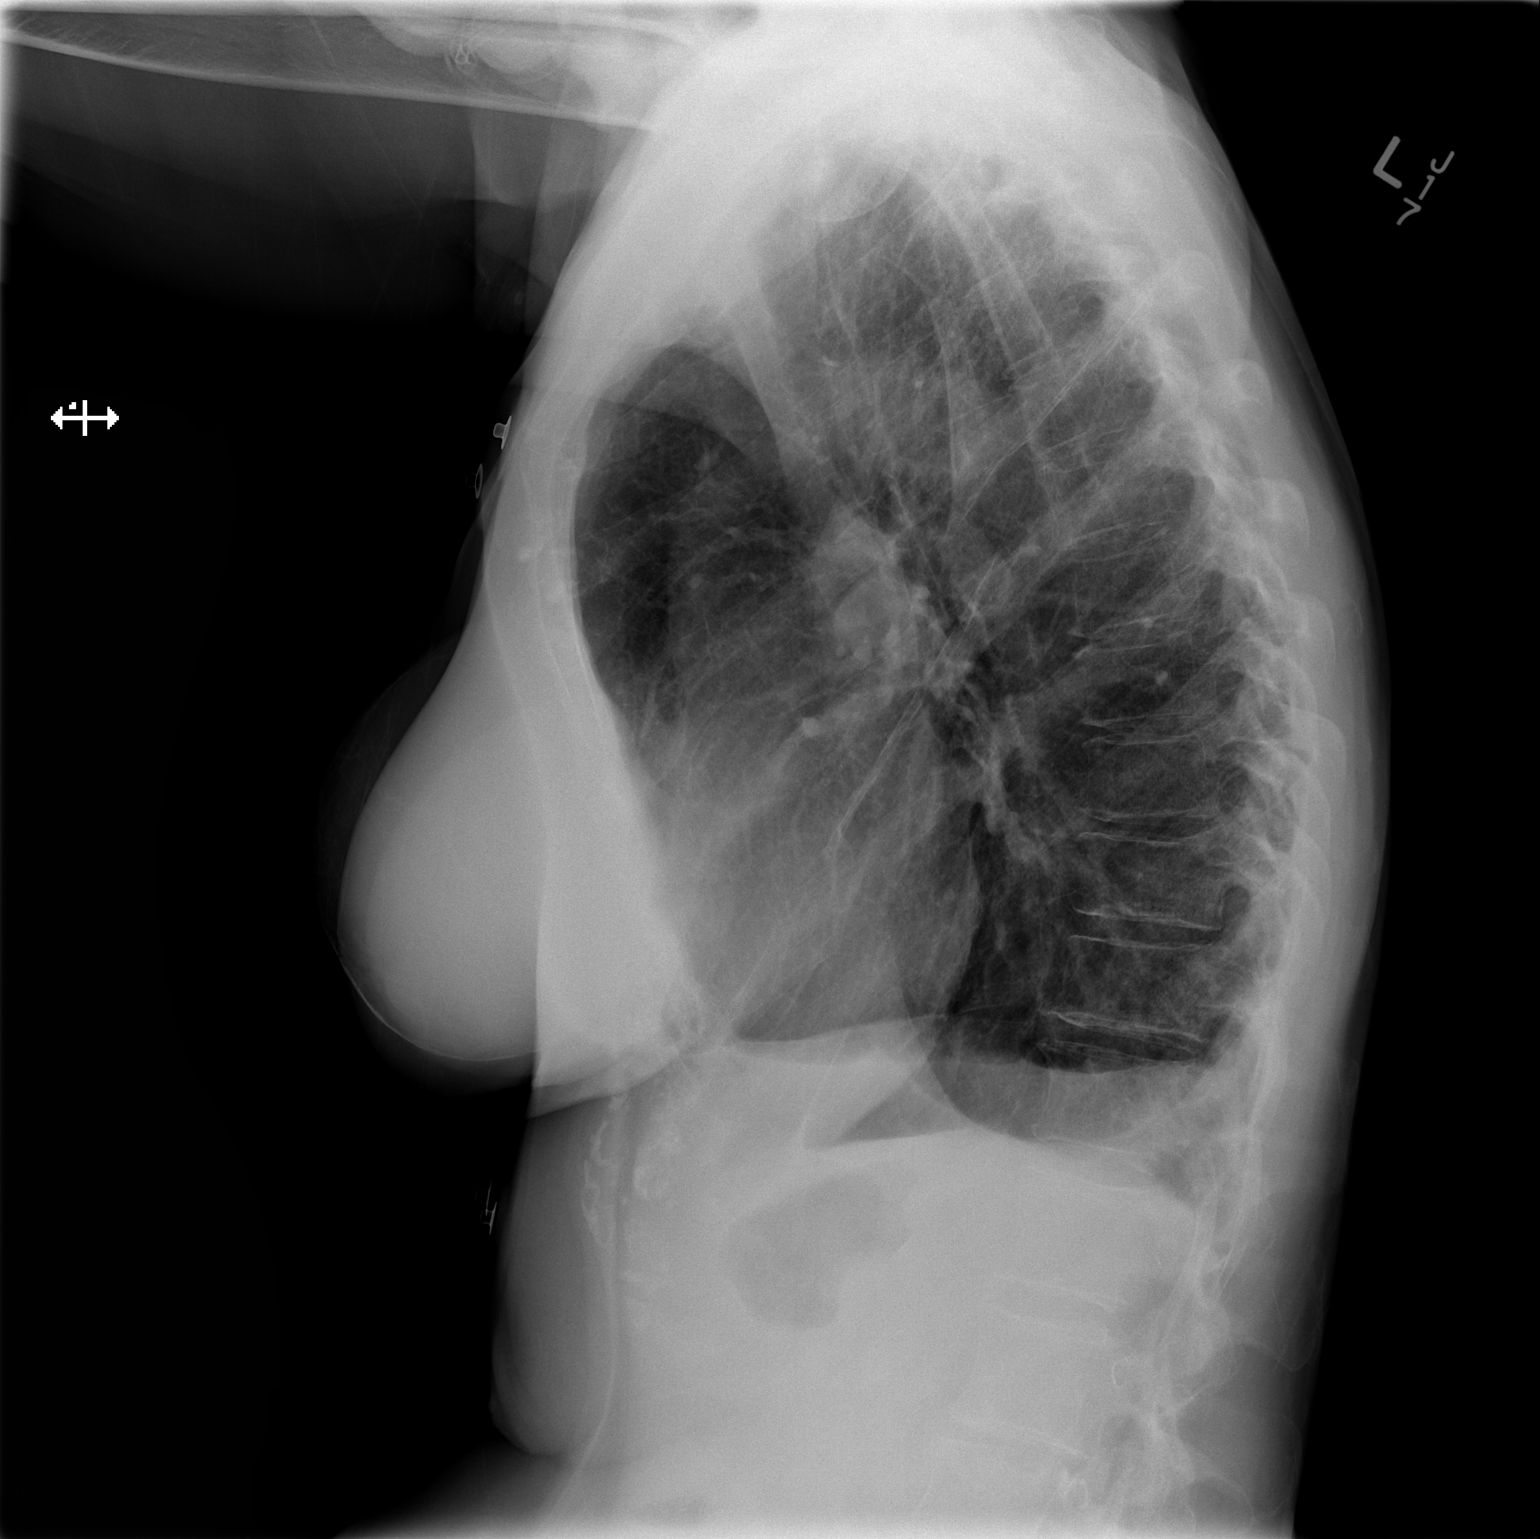

[2 of 2 positions shown; findings below may reference images not displayed]

FINDINGS: There has been a decrease in the small  basal and lateral
components of the right pneumothorax.

No acute infiltrates.  Vascularity and heart size are normal.
Hyperinflation consistent with the known emphysema.
IMPRESSION: Further decrease in the small right pneumothorax.

## 2013-09-21 ENCOUNTER — Encounter: Payer: Self-pay | Admitting: Gastroenterology

## 2013-09-28 IMAGING — CR DG CHEST 2V
2 series · 2 of 2 positions shown · non-contrast
Comparison: 06/08/2012 and prior chest radiographs.  06/02/2012 CT

CLINICAL DATA: 71-year-old female with shortness of breath.

CHEST - 2 VIEW

[w chest lat]
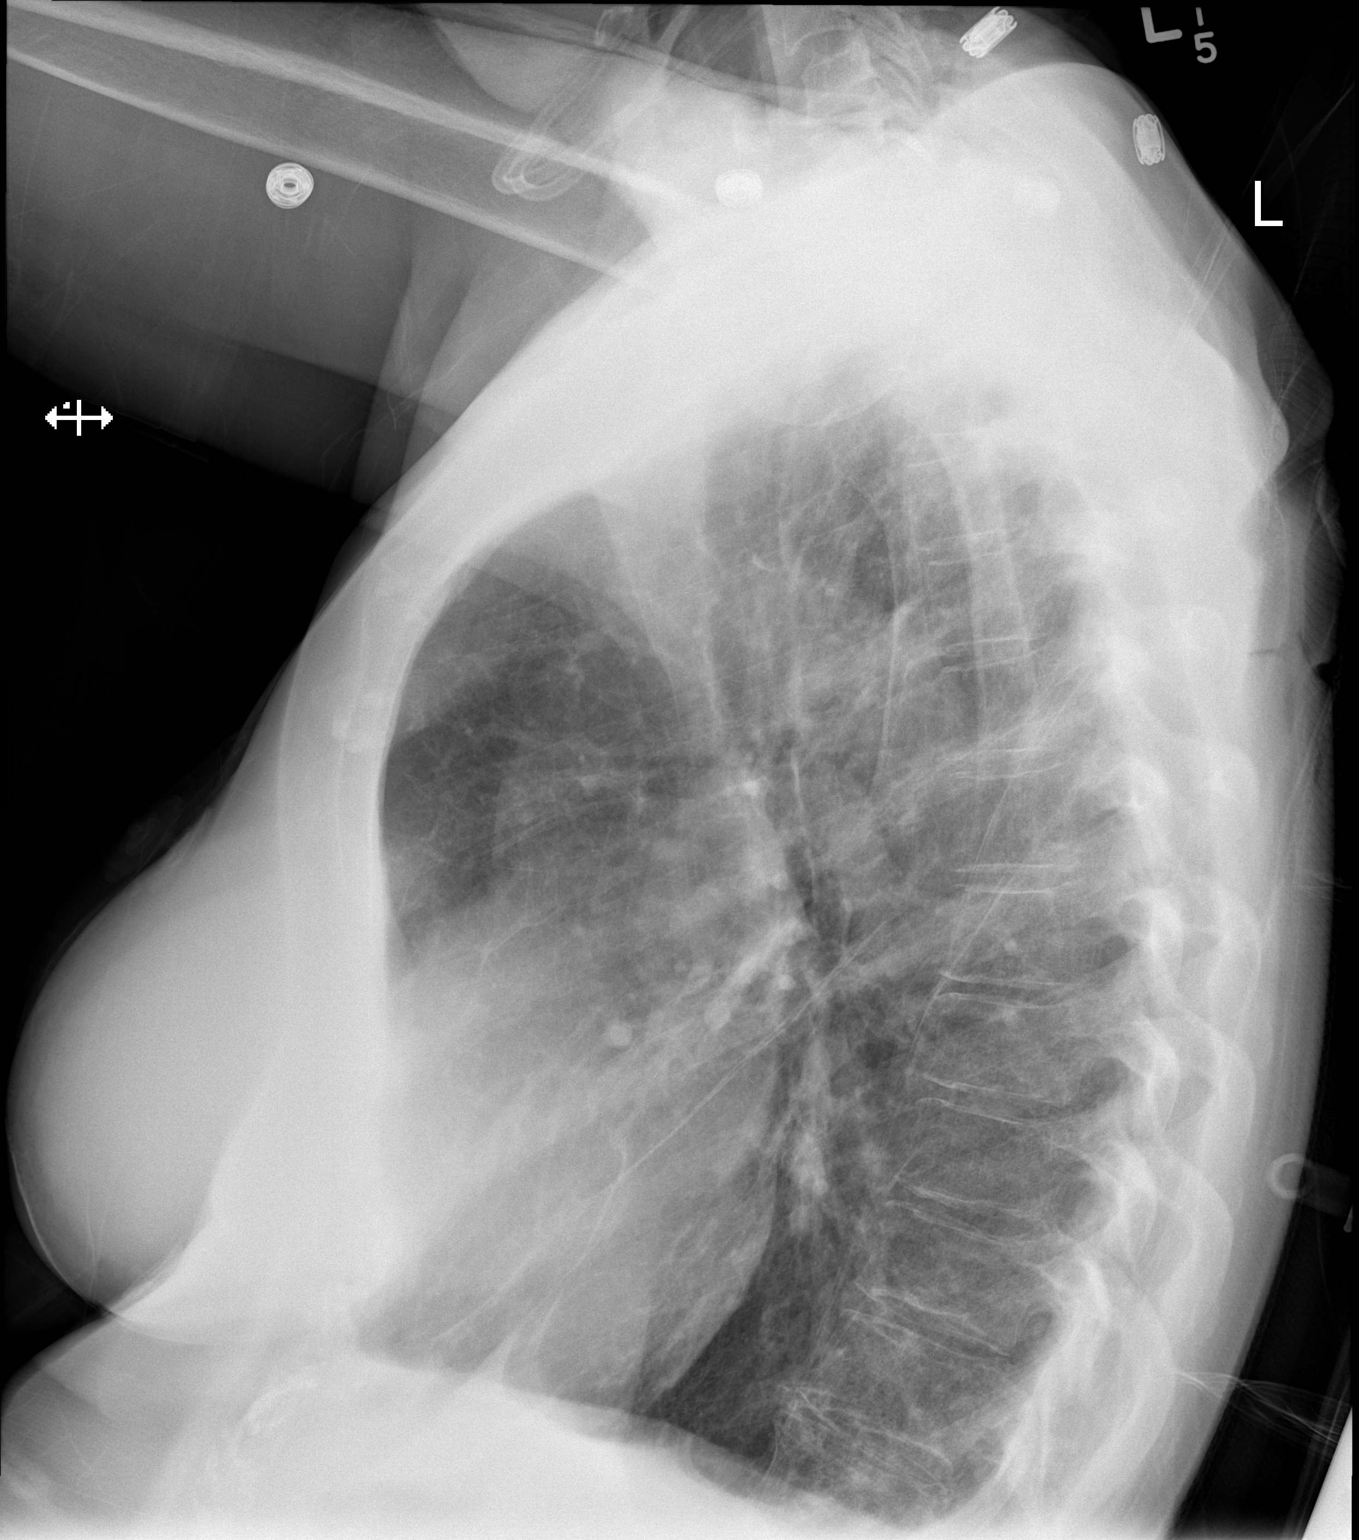

[x chest ap]
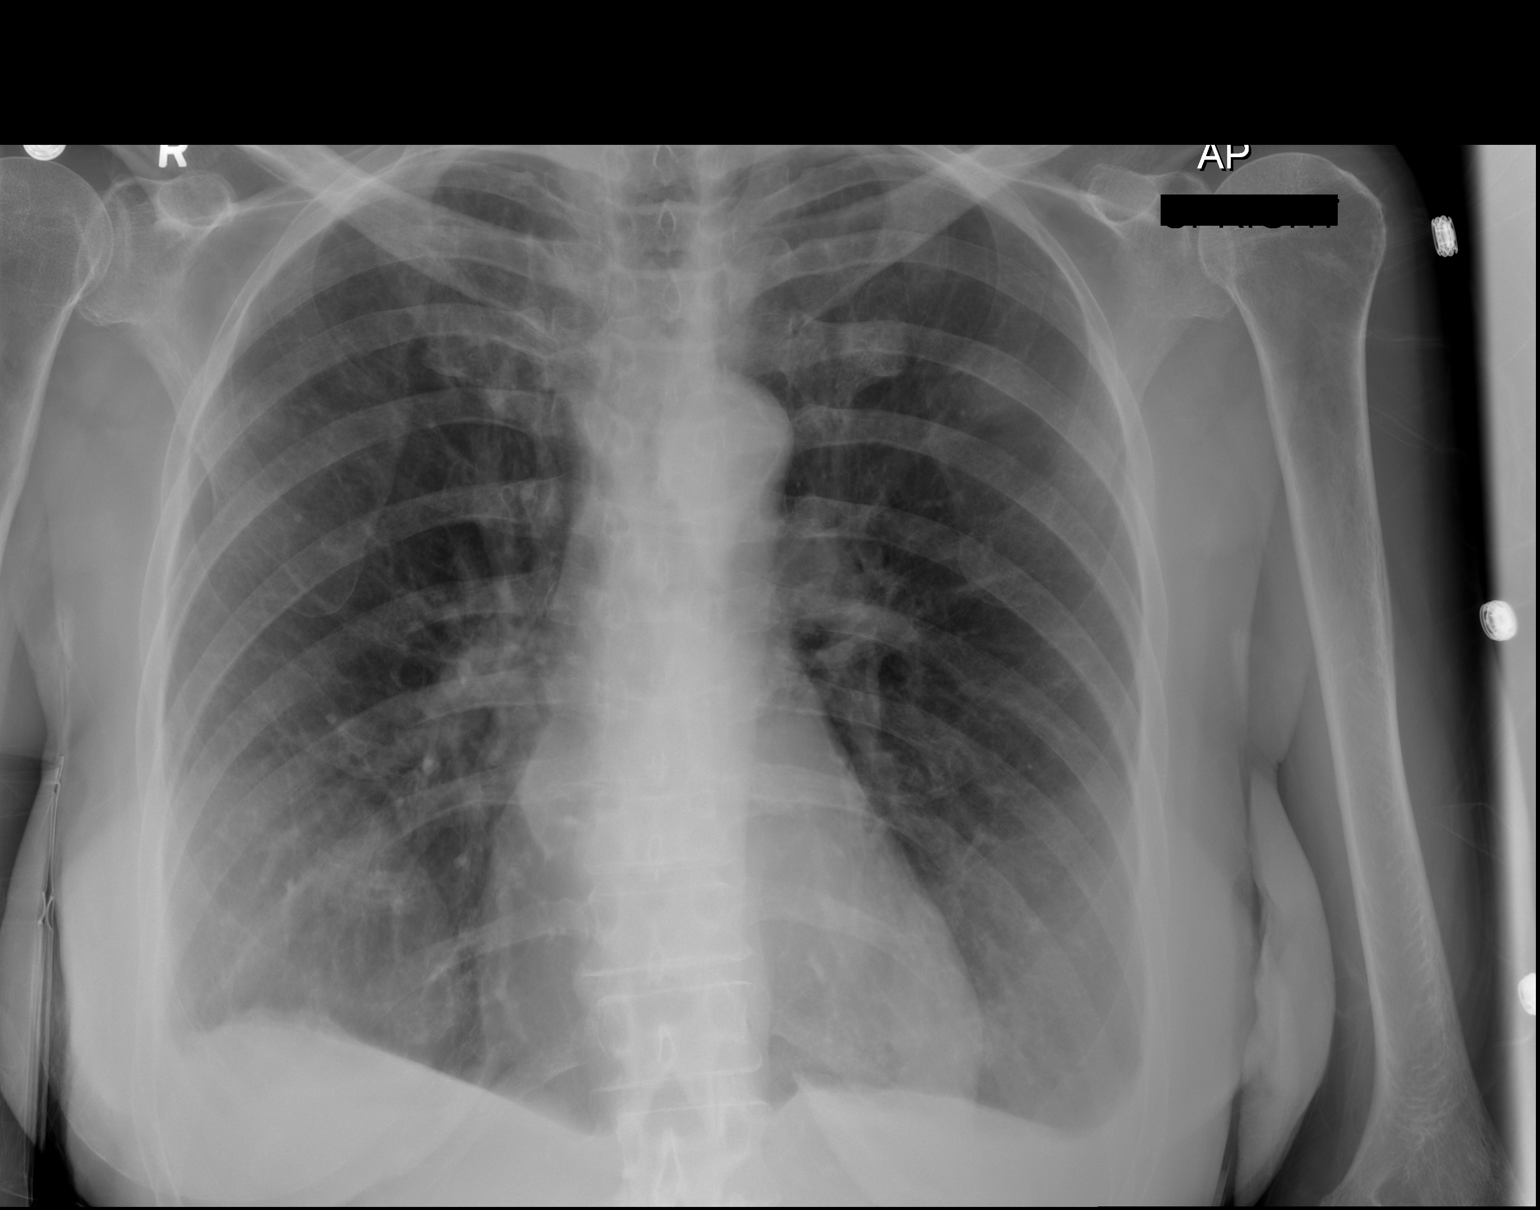

[2 of 2 positions shown; findings below may reference images not displayed]

FINDINGS: The cardiomediastinal silhouette is unremarkable.
COPD/emphysema and small bilateral pleural effusions are again
noted.
There is no evidence of focal airspace disease, pulmonary edema,
suspicious pulmonary nodule/mass, pleural effusion, or
pneumothorax.
No acute bony abnormalities are identified.
Bilateral breast prosthesis again noted.
IMPRESSION: No significant change from 06/08/2012.

COPD/emphysema and small bilateral pleural effusions.

## 2013-09-29 ENCOUNTER — Other Ambulatory Visit: Payer: Self-pay | Admitting: Family Medicine

## 2013-10-02 IMAGING — CR DG CHEST 1V PORT
1 series · 1 of 1 positions shown · non-contrast
Comparison: 06/17/2012

CLINICAL DATA: Short of breath

PORTABLE CHEST - 1 VIEW

[AP]
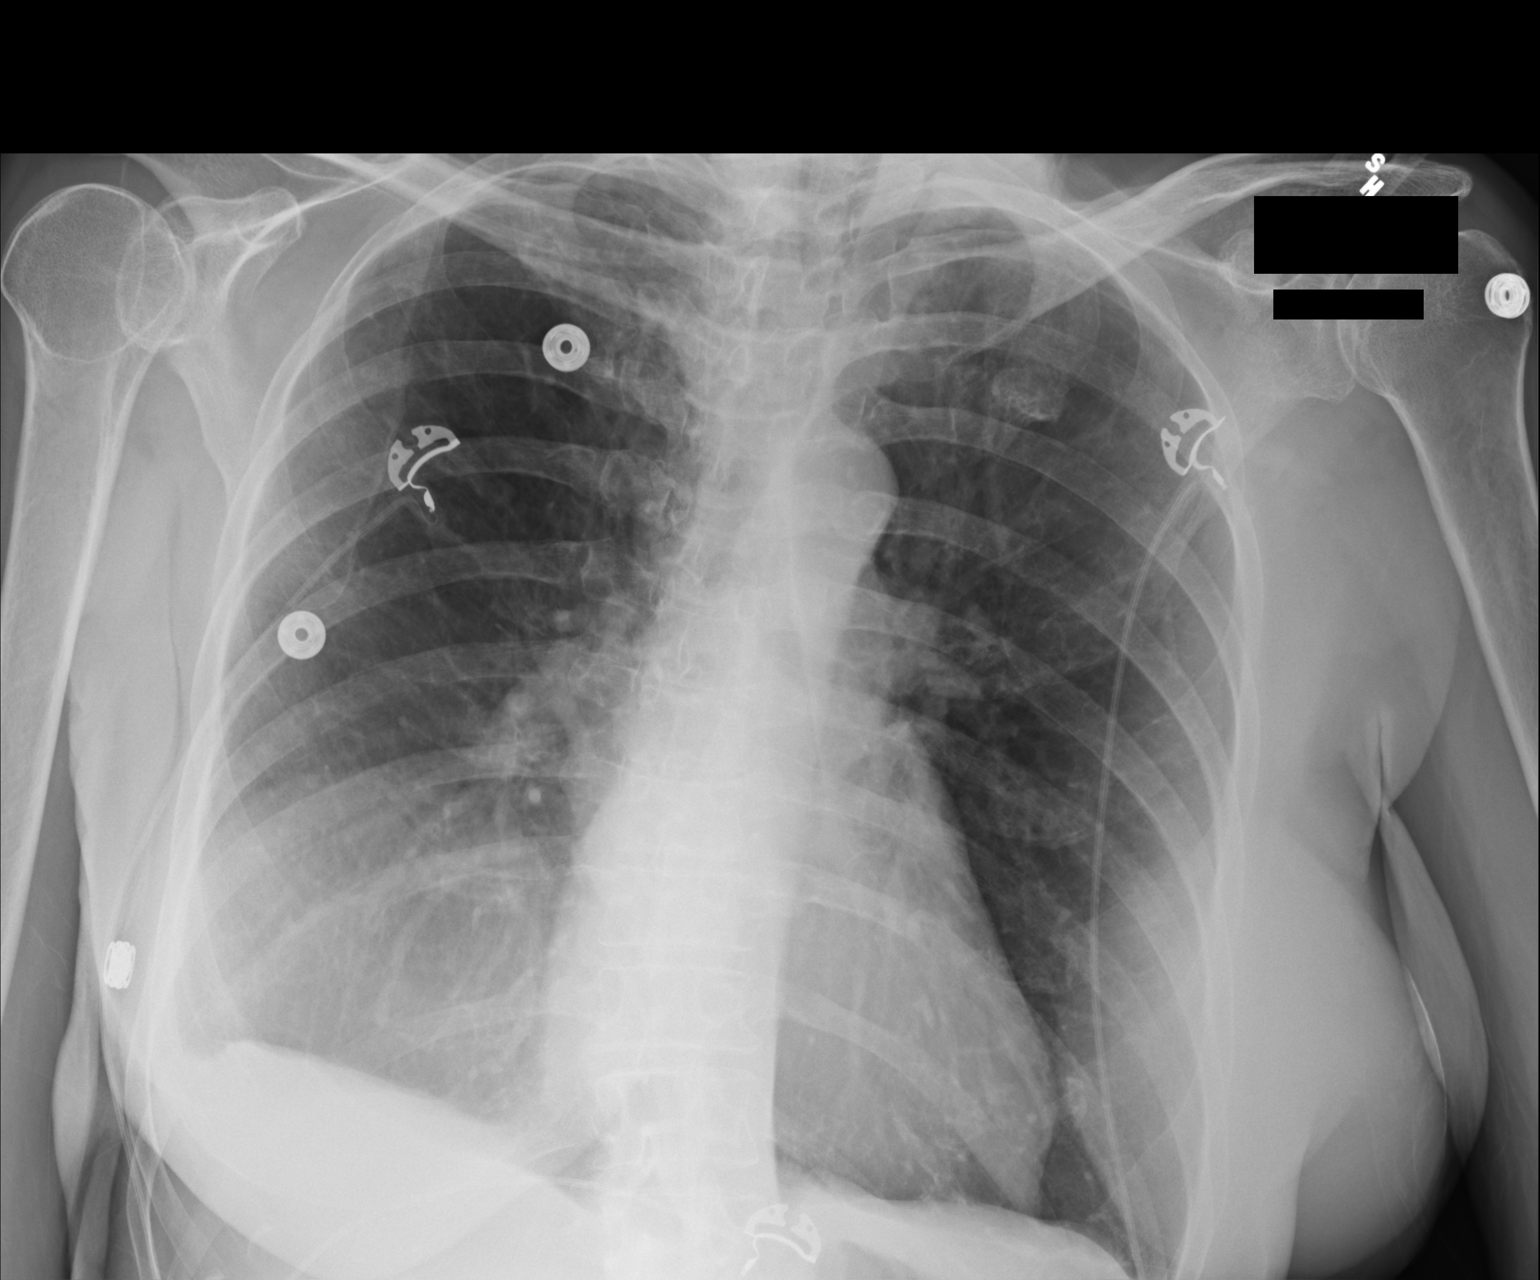

[1 of 1 positions shown; findings below may reference images not displayed]

FINDINGS: Heart size is normal.  The lungs appear hyperinflated and there are
coarsened interstitial markings identified bilaterally.  Pleural
and parenchymal scarring noted at the right base with blunting of
the right costophrenic angle.  This is similar to previous exam.
IMPRESSION: 1.  No acute cardiopulmonary abnormalities.
2.  COPD/emphysema.

## 2013-10-02 NOTE — Telephone Encounter (Signed)
Last filled 09/2012 with no refills--pt had a MCR wellness 07/07/13--pleas advise

## 2013-10-02 NOTE — Telephone Encounter (Signed)
Pt left v/m requesting status of refill for venlafaxine.Please advise.

## 2013-10-03 NOTE — Telephone Encounter (Signed)
Sent!

## 2013-10-04 IMAGING — CR DG CHEST 2V
2 series · 2 of 2 positions shown · non-contrast
Comparison: 06/21/2012

CLINICAL DATA: Short of breath

CHEST - 2 VIEW

[w chest pa]
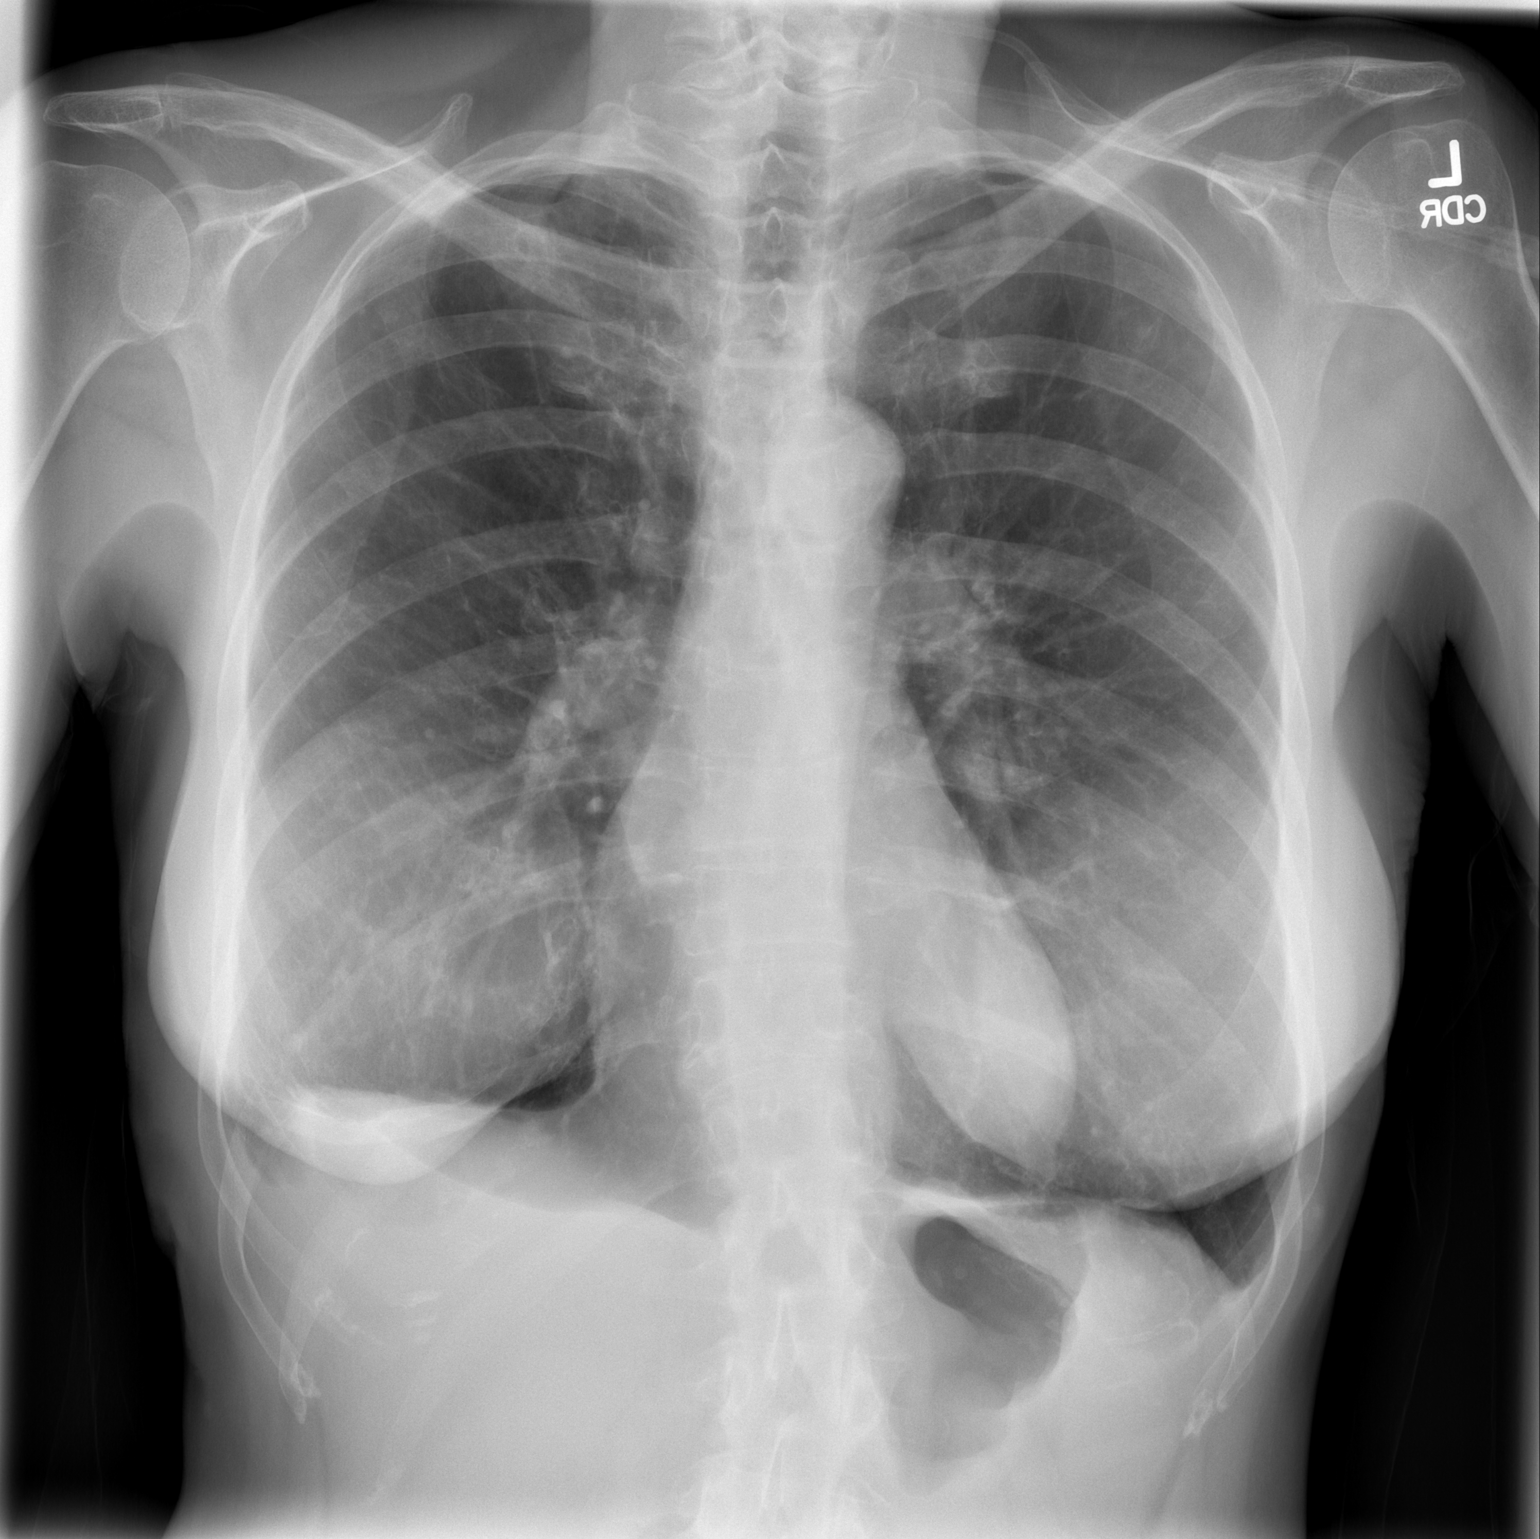

[w chest lat]
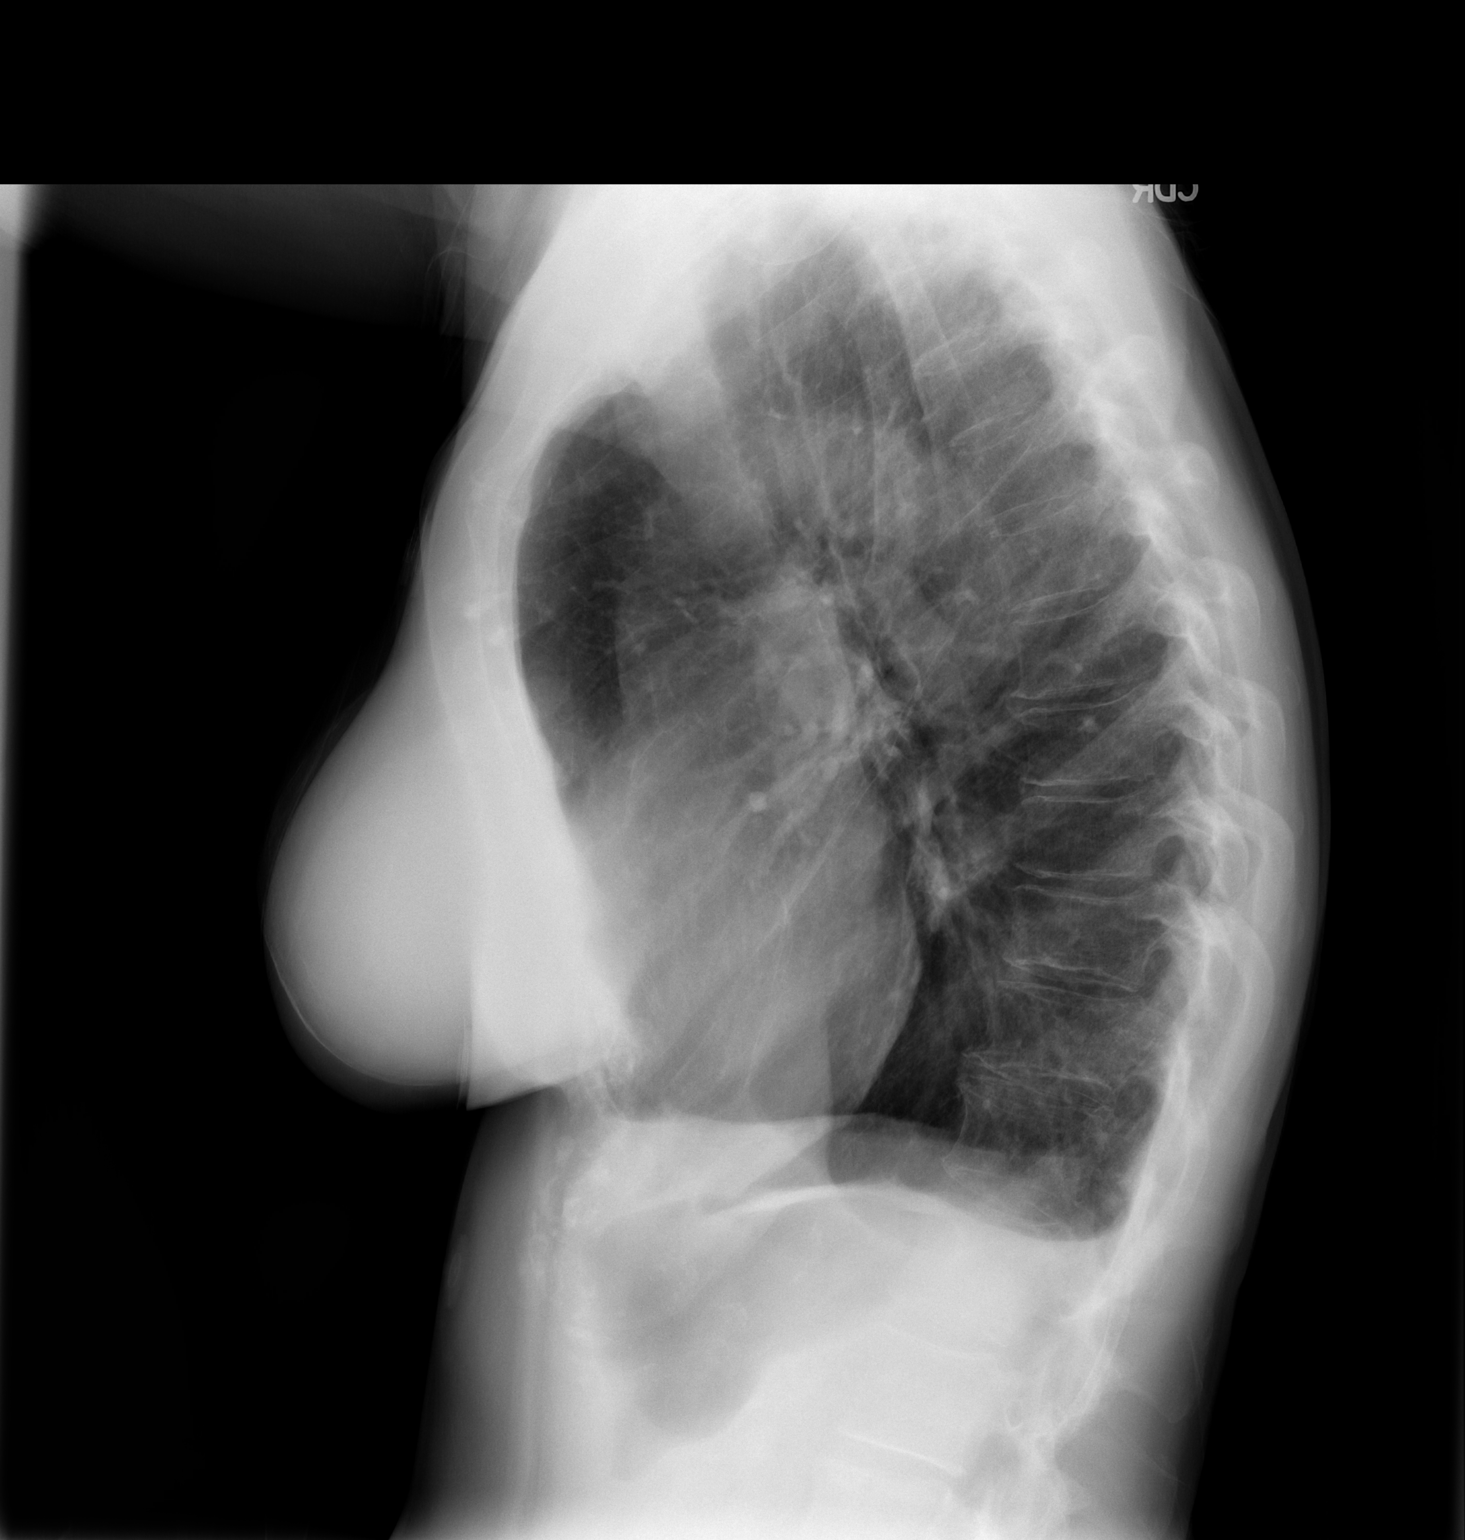

[2 of 2 positions shown; findings below may reference images not displayed]

FINDINGS: Stable right pleural effusion and basilar atelectasis.
Tiny left pleural effusion.  Hyperaeration.  Calcified granulomata
are noted.  No new focal consolidation or mass.  Normal heart size.
No pneumothorax.
IMPRESSION: Small bilateral pleural effusions.  Chronic changes.

## 2013-10-05 ENCOUNTER — Ambulatory Visit (INDEPENDENT_AMBULATORY_CARE_PROVIDER_SITE_OTHER)
Admission: RE | Admit: 2013-10-05 | Discharge: 2013-10-05 | Disposition: A | Discharge status: Home / Self Care | Payer: Medicare Other | Source: Ambulatory Visit | Attending: Family Medicine | Admitting: Family Medicine

## 2013-10-05 ENCOUNTER — Encounter: Payer: Self-pay | Admitting: Family Medicine

## 2013-10-05 ENCOUNTER — Ambulatory Visit (INDEPENDENT_AMBULATORY_CARE_PROVIDER_SITE_OTHER): Payer: Medicare Other | Admitting: Family Medicine

## 2013-10-05 VITALS — BP 150/80 | HR 93 | Temp 97.5°F | Wt 162.2 lb

## 2013-10-05 DIAGNOSIS — R05 Cough: Secondary | ICD-10-CM

## 2013-10-05 DIAGNOSIS — R059 Cough, unspecified: Secondary | ICD-10-CM

## 2013-10-05 DIAGNOSIS — J441 Chronic obstructive pulmonary disease with (acute) exacerbation: Secondary | ICD-10-CM

## 2013-10-05 MED ORDER — SULFAMETHOXAZOLE-TMP DS 800-160 MG PO TABS: 1.0000 | ORAL_TABLET | Freq: Two times a day (BID) | ORAL | Status: DC

## 2013-10-05 NOTE — Patient Instructions (Signed)
Start the antibiotics today, use your oxygen as needed, and keep using your inhalers.   Give yourself a hug when you cough.   Take tylenol for pain.  Take care.

## 2013-10-05 NOTE — Assessment & Plan Note (Signed)
Discolored sputum, more cough.  No PNA on cxr. Reviewed, d/w pt.  Continue O2.  She doesn't tolerate oral steroids well.  Continue inhalers, start septra given recent zmax use and f/u prn.  She agrees. >25 minutes spent in face to face time with patient, >50% spent in counselling or coordination of care.

## 2013-10-05 NOTE — Progress Notes (Signed)
Pre visit review using our clinic review tool, if applicable. No additional management support is needed unless otherwise documented below in the visit note.  She complained that she was never told about prev u/s results.  I read her the time stamped documents showing where staff contacted her.   She initially started complaining about a pain inferior to her R breast/mastectomy site but then with sig effort for clarification, it became apparent that she had no complaint there but was complaining about L sided pain, see below.   Pain with a deep breath, in the L side of the chest.  Pain on L side of chest with a cough.  Chronic cough recently worse, some days worse than others.  No fevers.  Yellow sputum recently, some at baseline, possibly worse now.   Uses O2 with exertion, at baseline.  No vomiting.  Some nausea.  No dysphagia.  ST, recently, going on for about 3 days.  Some wheeze, has that at baseline.  No BLE edema.    Meds, vitals, and allergies reviewed.   ROS: See HPI.  Otherwise, noncontributory.  nad ncat Mmm Op wnl except for mild cobblestoning.   Neck supple, no LA rrr Global dec in BS, no focal dec in BS abd soft Ext w/o edema L chest wall pain with a cough

## 2013-10-11 IMAGING — CR DG CHEST 2V
2 series · 2 of 2 positions shown · non-contrast
Comparison: June 23, 2012

CLINICAL DATA: Coronary artery disease. Shortness of breath

CHEST - 2 VIEW

[w chest lat]
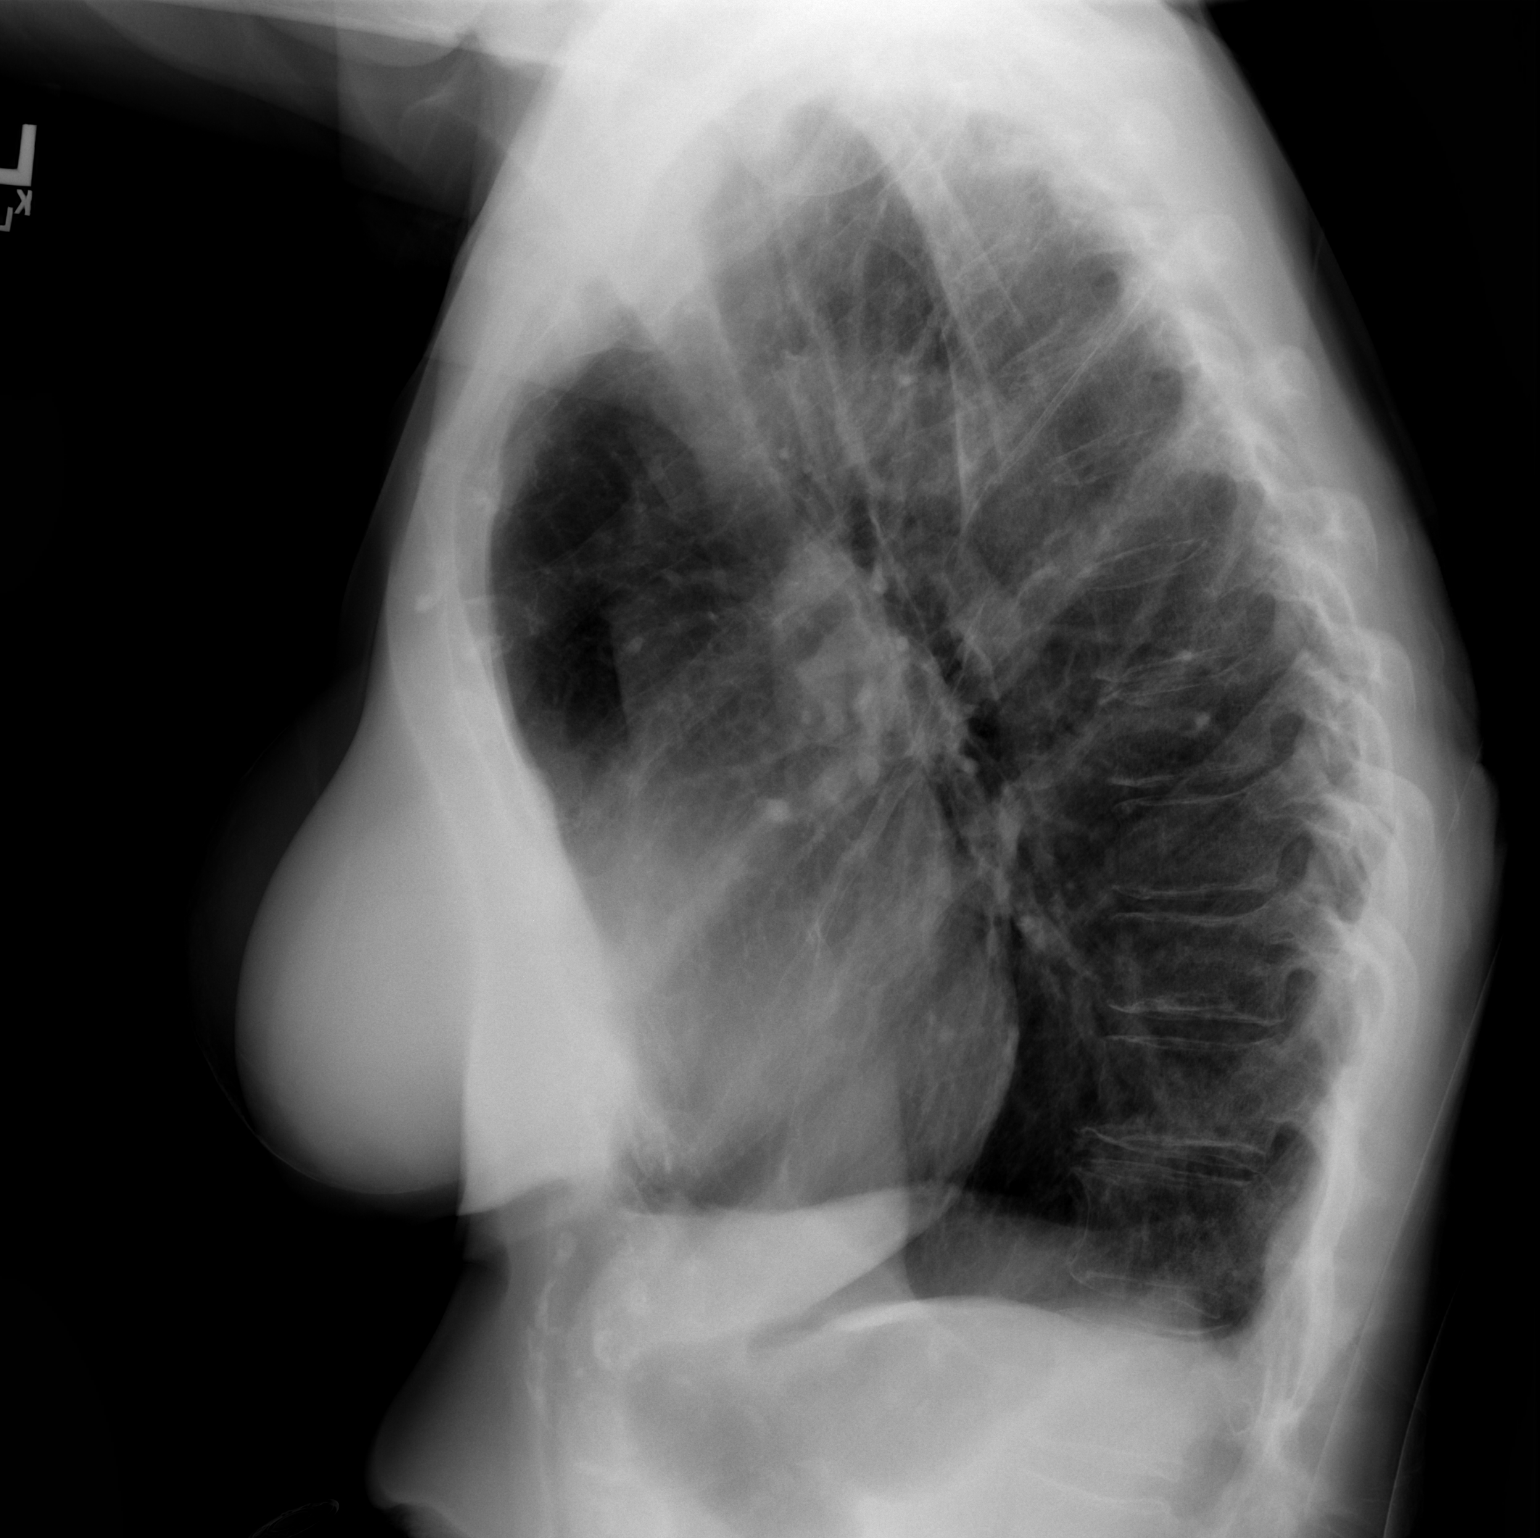

[w chest ap]
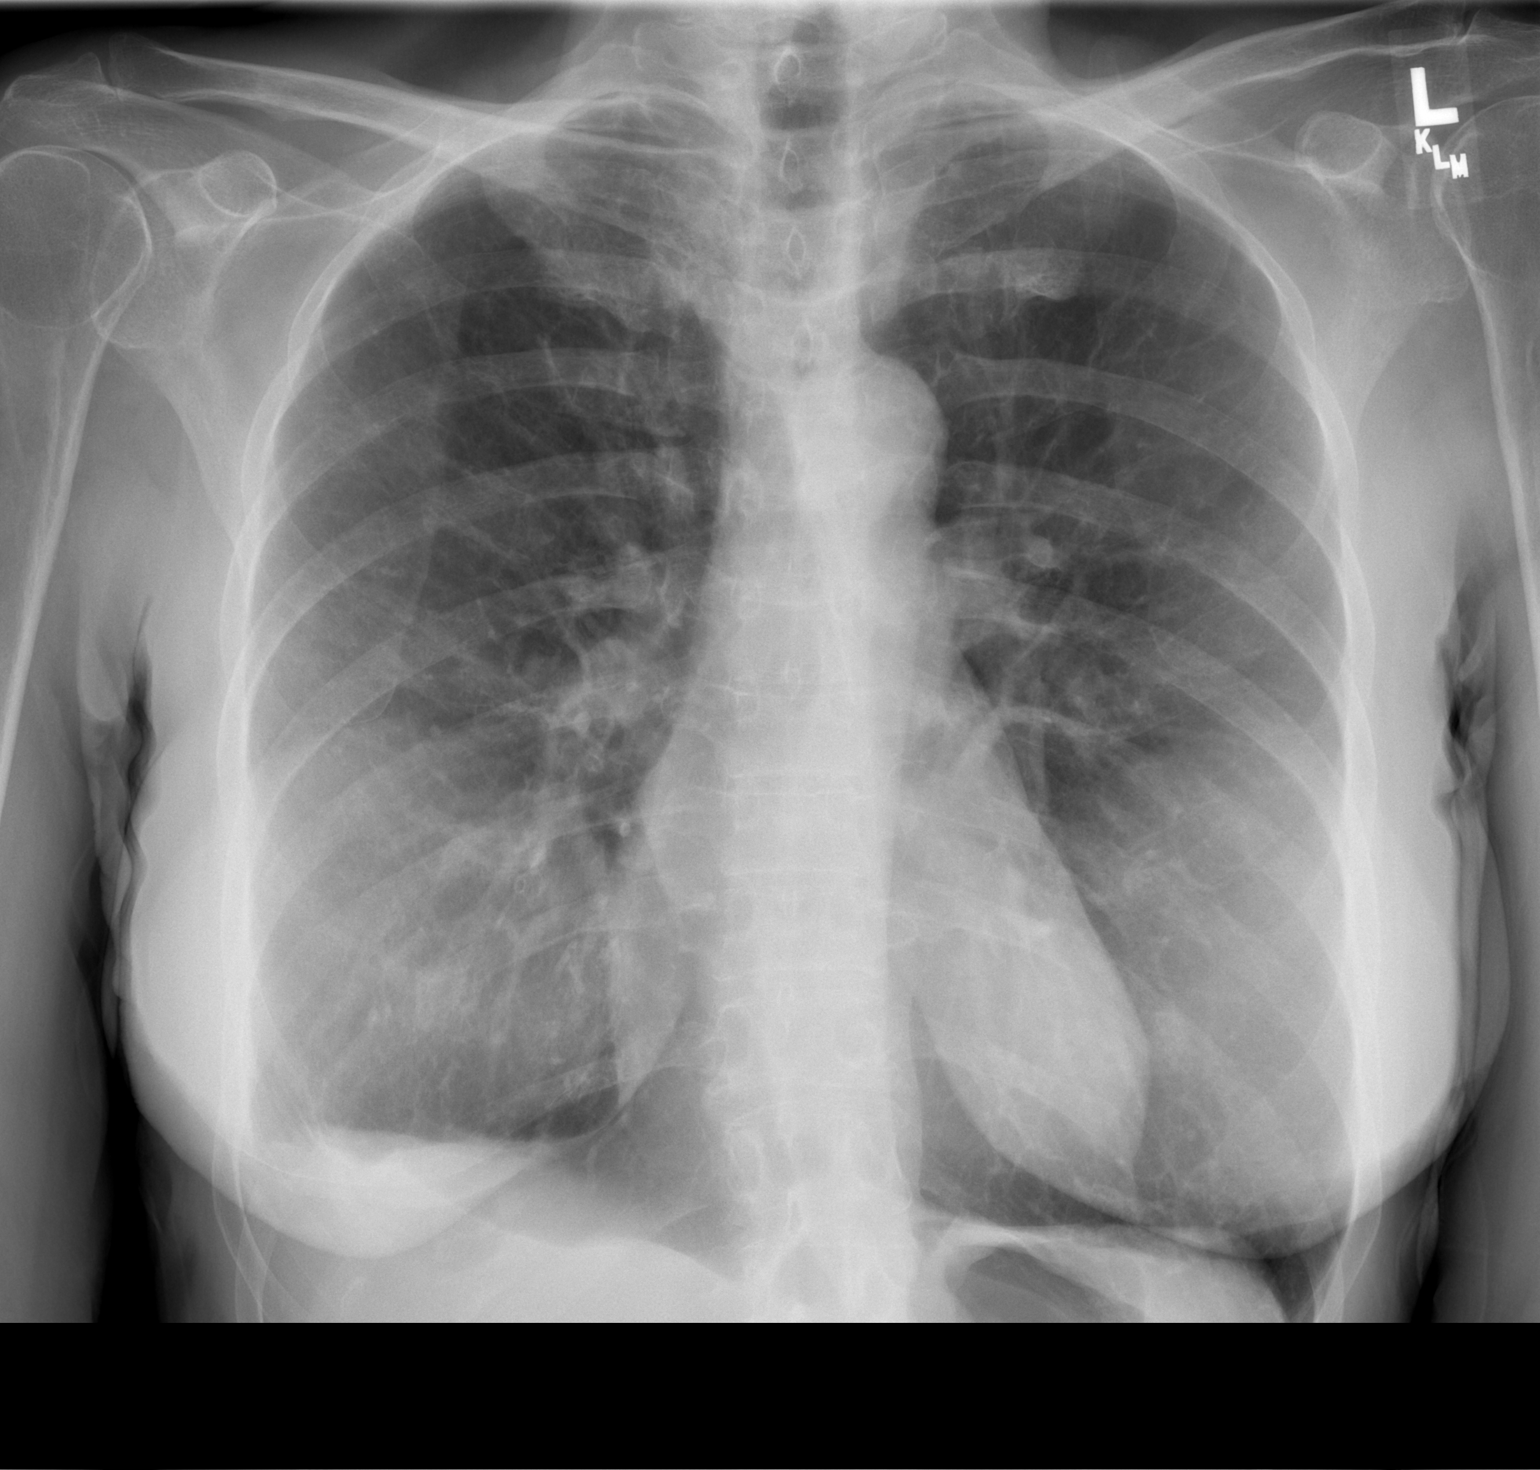

[2 of 2 positions shown; findings below may reference images not displayed]

FINDINGS: There is underlying emphysema.  There is scarring in the
right base.  There may be minimal pleural effusion on the right,
stable.

There is no edema or consolidation.  No apparent pneumothorax.
Heart size is normal.  The pulmonary vascular reflects underlying
emphysema and is stable.  No adenopathy.  There are no appreciable
bone lesions.
IMPRESSION: Underlying emphysema.  Right base scarring with questionable
minimal right effusion.  No edema or consolidation.

## 2013-10-20 IMAGING — CT CT ANGIO CHEST
1 of 2 series · 19 of 32 positions shown · IV contrast (APPLIED)
Comparison: Chest radiographs dated 07/09/2012.  CT chest dated
06/02/2012.

CLINICAL DATA: Right sided chest pain, shortness of breath

CT ANGIOGRAPHY CHEST
TECHNIQUE: Multidetector CT imaging of the chest using the
standard protocol during bolus administration of intravenous
contrast. Multiplanar reconstructed images including MIPs were
obtained and reviewed to evaluate the vascular anatomy.
Contrast: 100mL OMNIPAQUE IOHEXOL 350 MG/ML SOLN

[Series 6: pulm embolism 1.0 b25f thin · axial · 0.64mm/px · z∈[+1160,+1450]mm · 19 of 318 slices shown]
[im 14/318  lung]
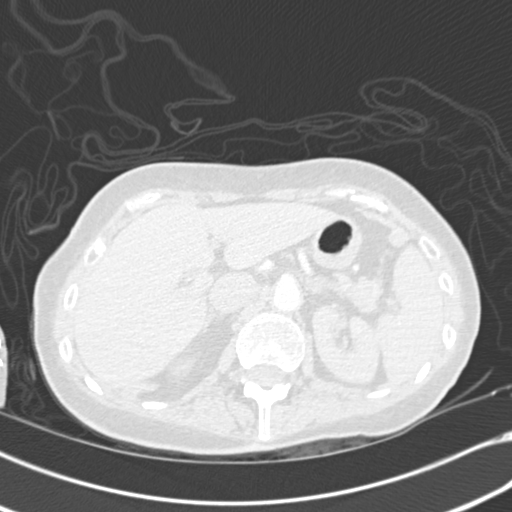
[im 28/318  soft-tissue]
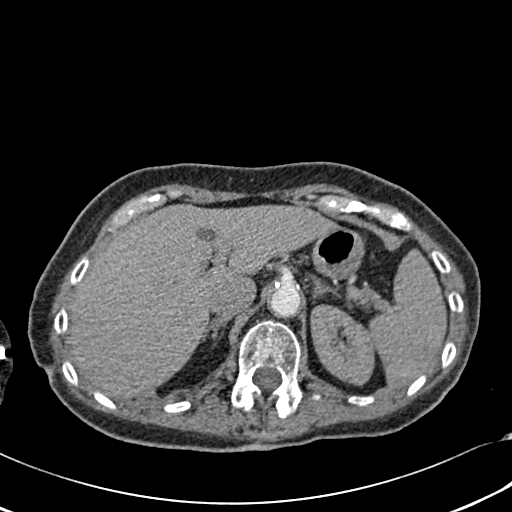
[im 42/318  lung]
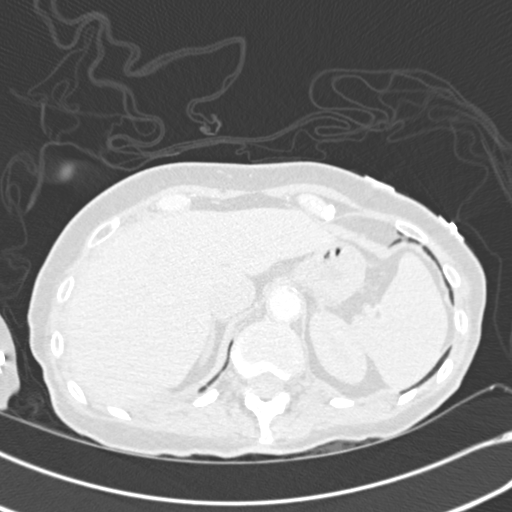
[im 69/318  soft-tissue]
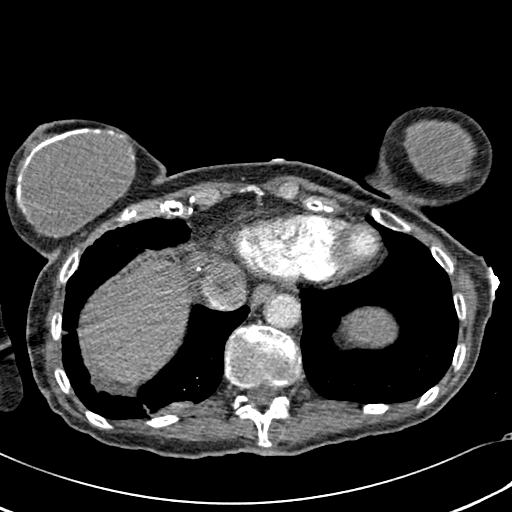
[im 83/318  lung]
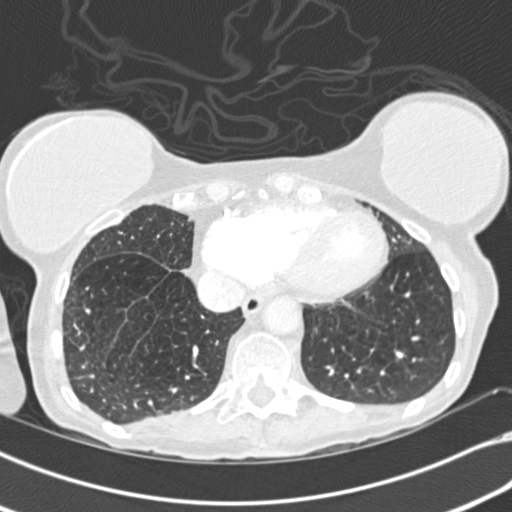
[im 97/318  soft-tissue]
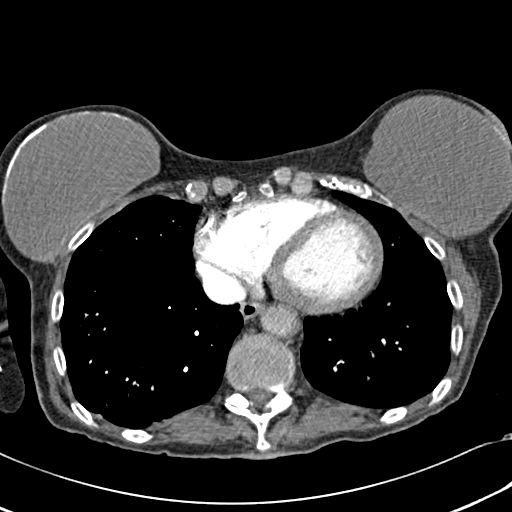
[im 111/318  lung]
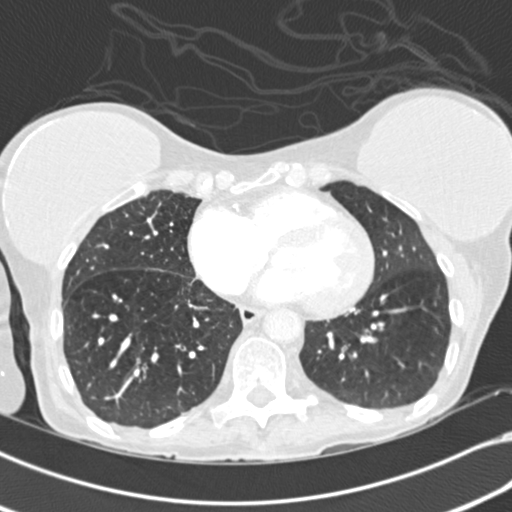
[im 125/318  soft-tissue]
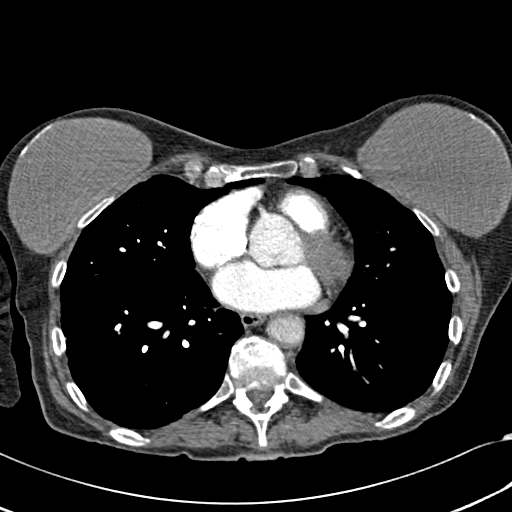
[im 138/318  lung]
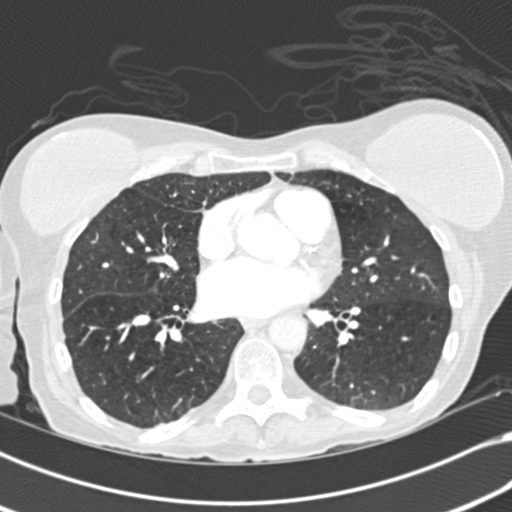
[im 166/318  soft-tissue]
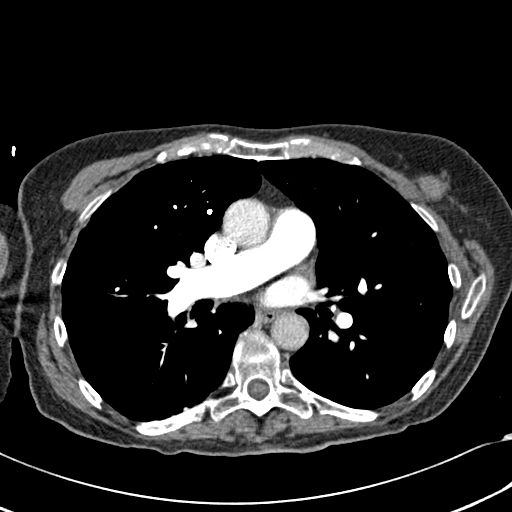
[im 180/318  lung]
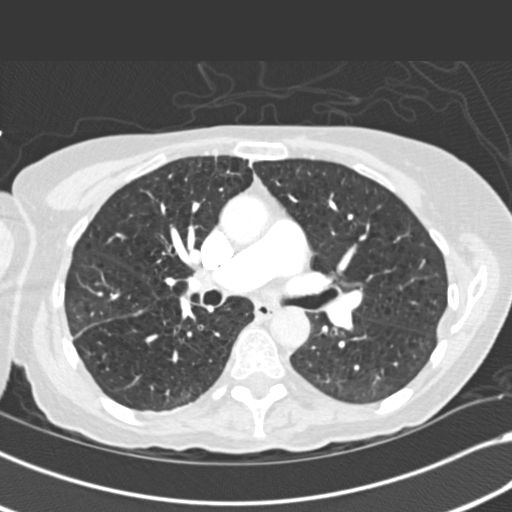
[im 193/318  soft-tissue]
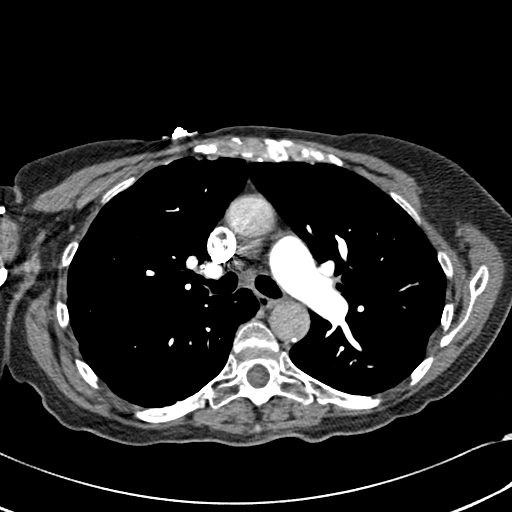
[im 207/318  lung]
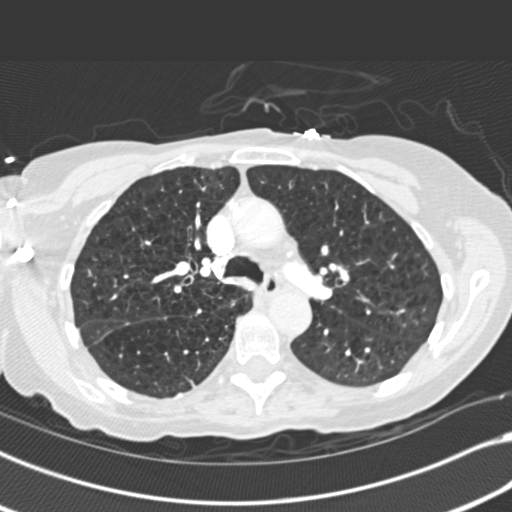
[im 221/318  soft-tissue]
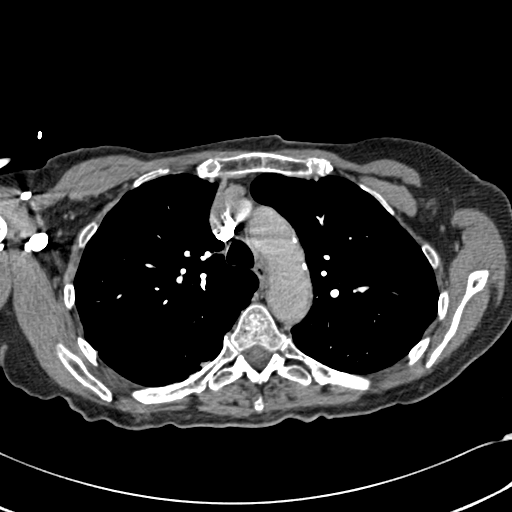
[im 235/318  lung]
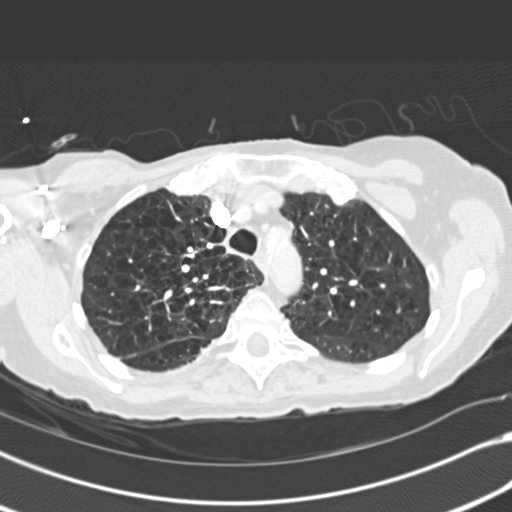
[im 249/318  soft-tissue]
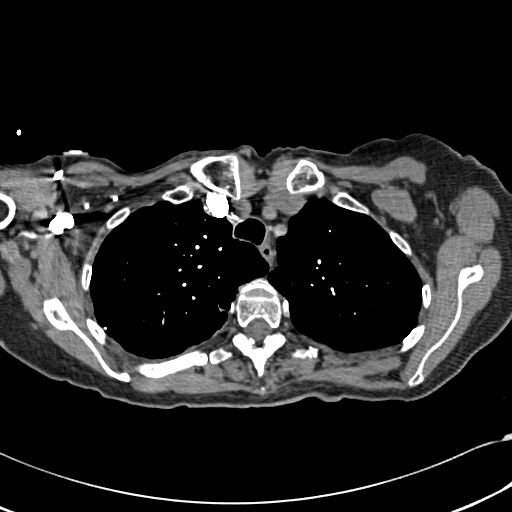
[im 276/318  lung]
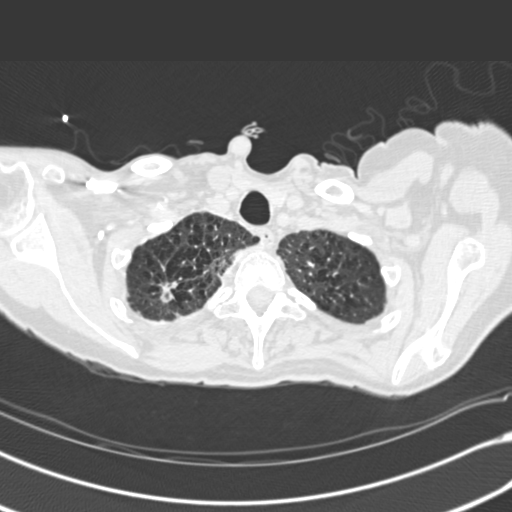
[im 290/318  soft-tissue]
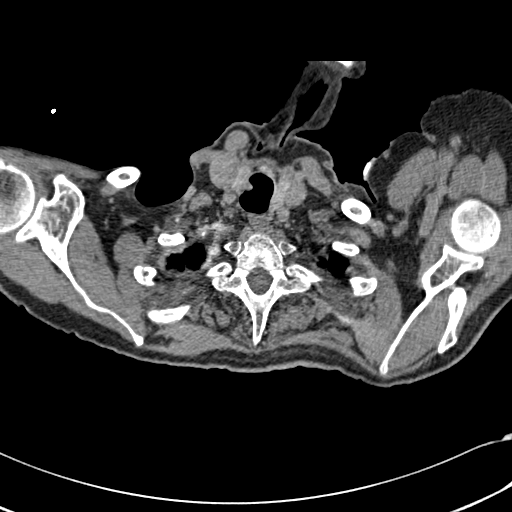
[im 304/318  lung]
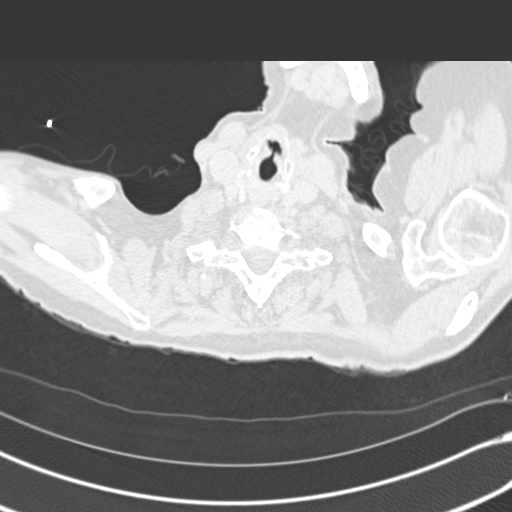

[19 of 32 positions shown; findings below may reference images not displayed]

FINDINGS: No evidence of pulmonary embolism.

14 x 13 mm thin-walled cavitary lesion in the right lung apex
(series 5/image 17), previously 16 x 14 mm.  Moderate centrilobular
emphysematous changes.  No suspicious pulmonary nodules.  Trace
right pleural fluid, decreased.  No pneumothorax.

Visualized thyroid is grossly unremarkable.

The heart is normal in size.  No pericardial effusion.  Coronary
atherosclerosis.  Atherosclerotic calcifications of the aortic
arch.

No suspicious mediastinal, hilar, or axillary lymphadenopathy.

Bilateral breast augmentation.

Visualized upper abdomen is unremarkable.

Mild degenerative changes of the lower thoracolumbar spine.
IMPRESSION: No evidence of pulmonary embolism.

14 mm thin-walled cavitary lesion in the right lung apex,
decreased, likely infectious/inflammatory.

Trace right pleural fluid, decreased.

## 2013-10-20 IMAGING — CR DG CHEST 2V
2 series · 2 of 2 positions shown · non-contrast
Comparison: Two-view chest x-ray 06/30/2012, 06/23/2012,
05/28/2012, and one-view chest x-ray 04/29/2012.

CLINICAL DATA: Right-sided chest pain.  Shortness of breath.
Current history of COPD.  Prior history of pneumothorax.  Former
smoker who quit in February 2012.

CHEST - 2 VIEW

[w chest pa]
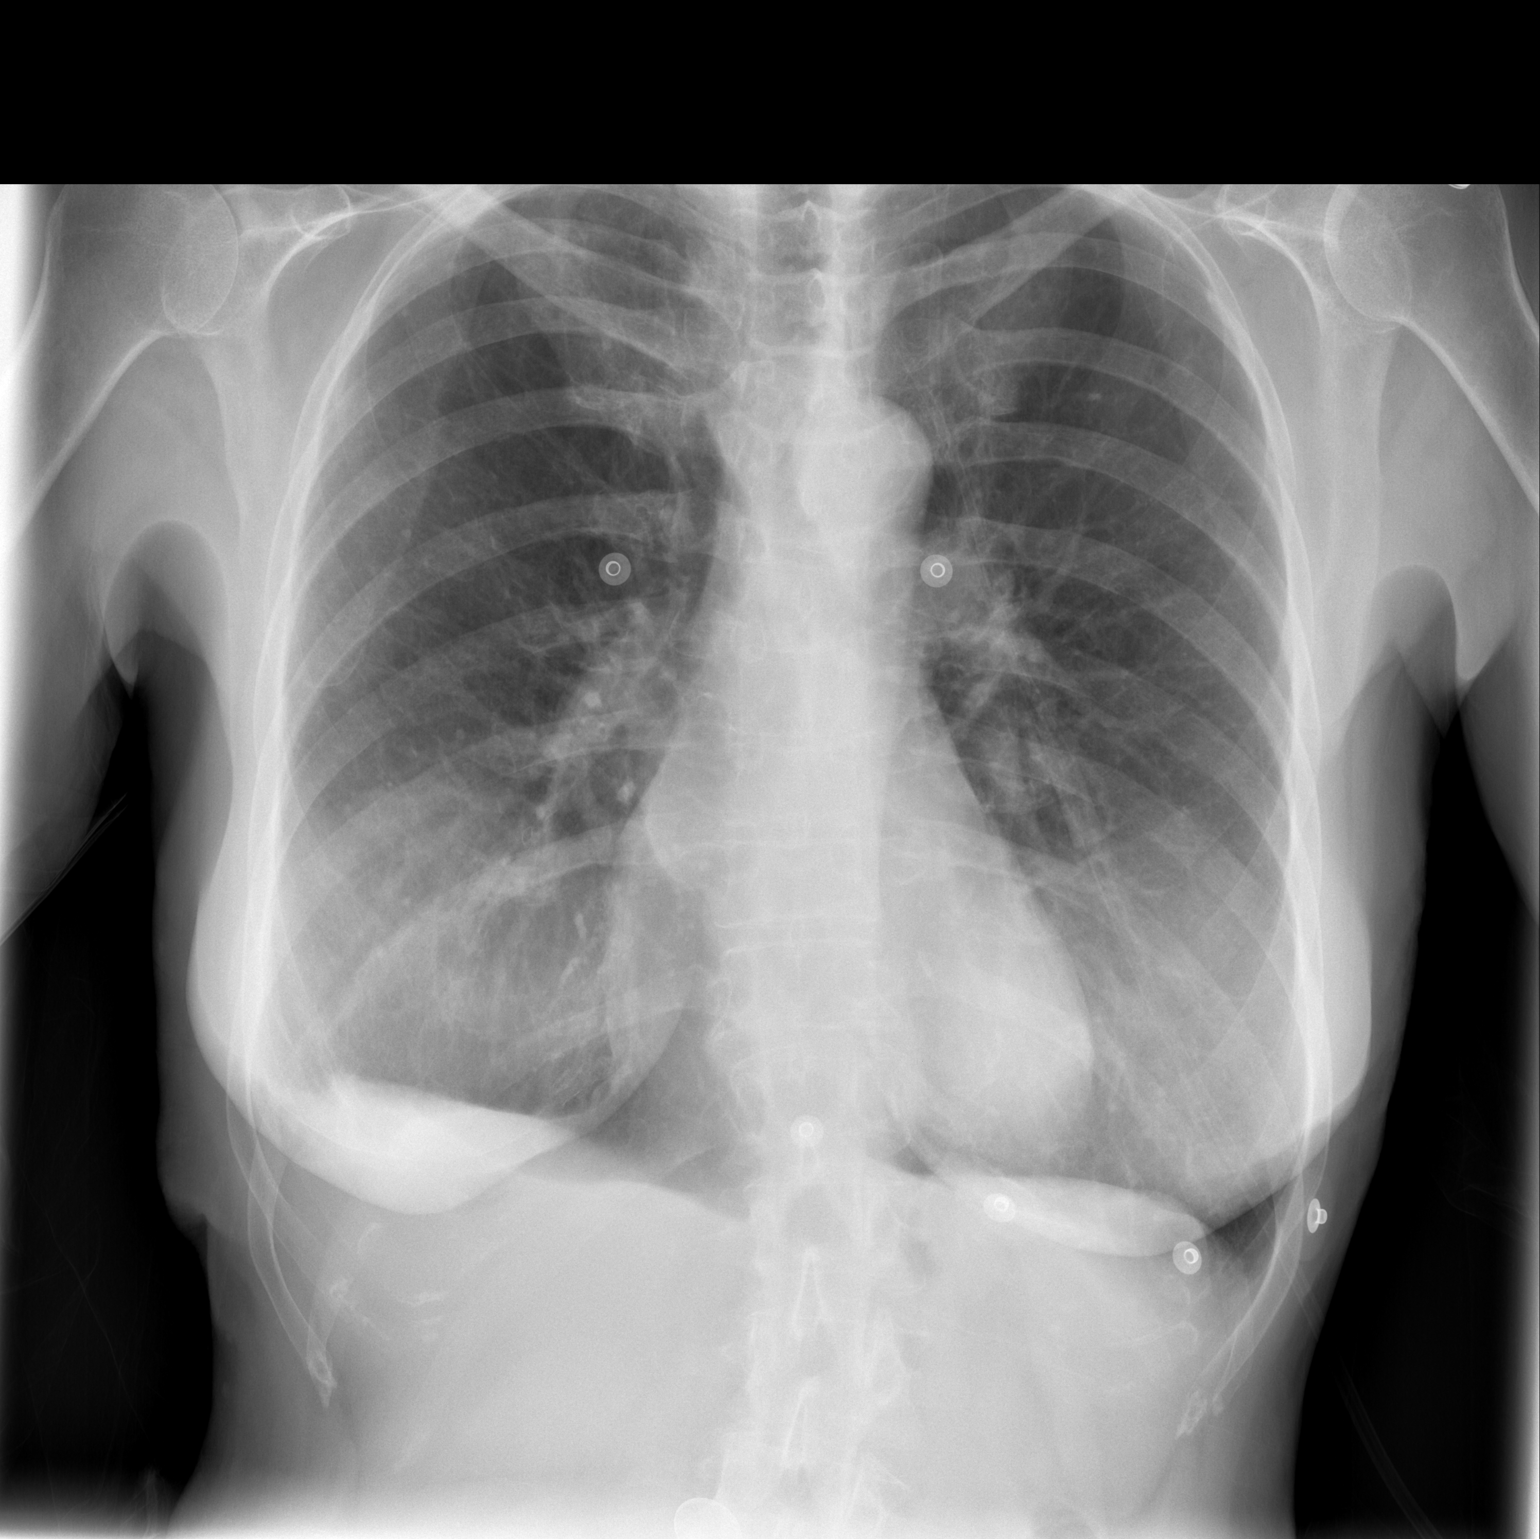

[w chest lat]
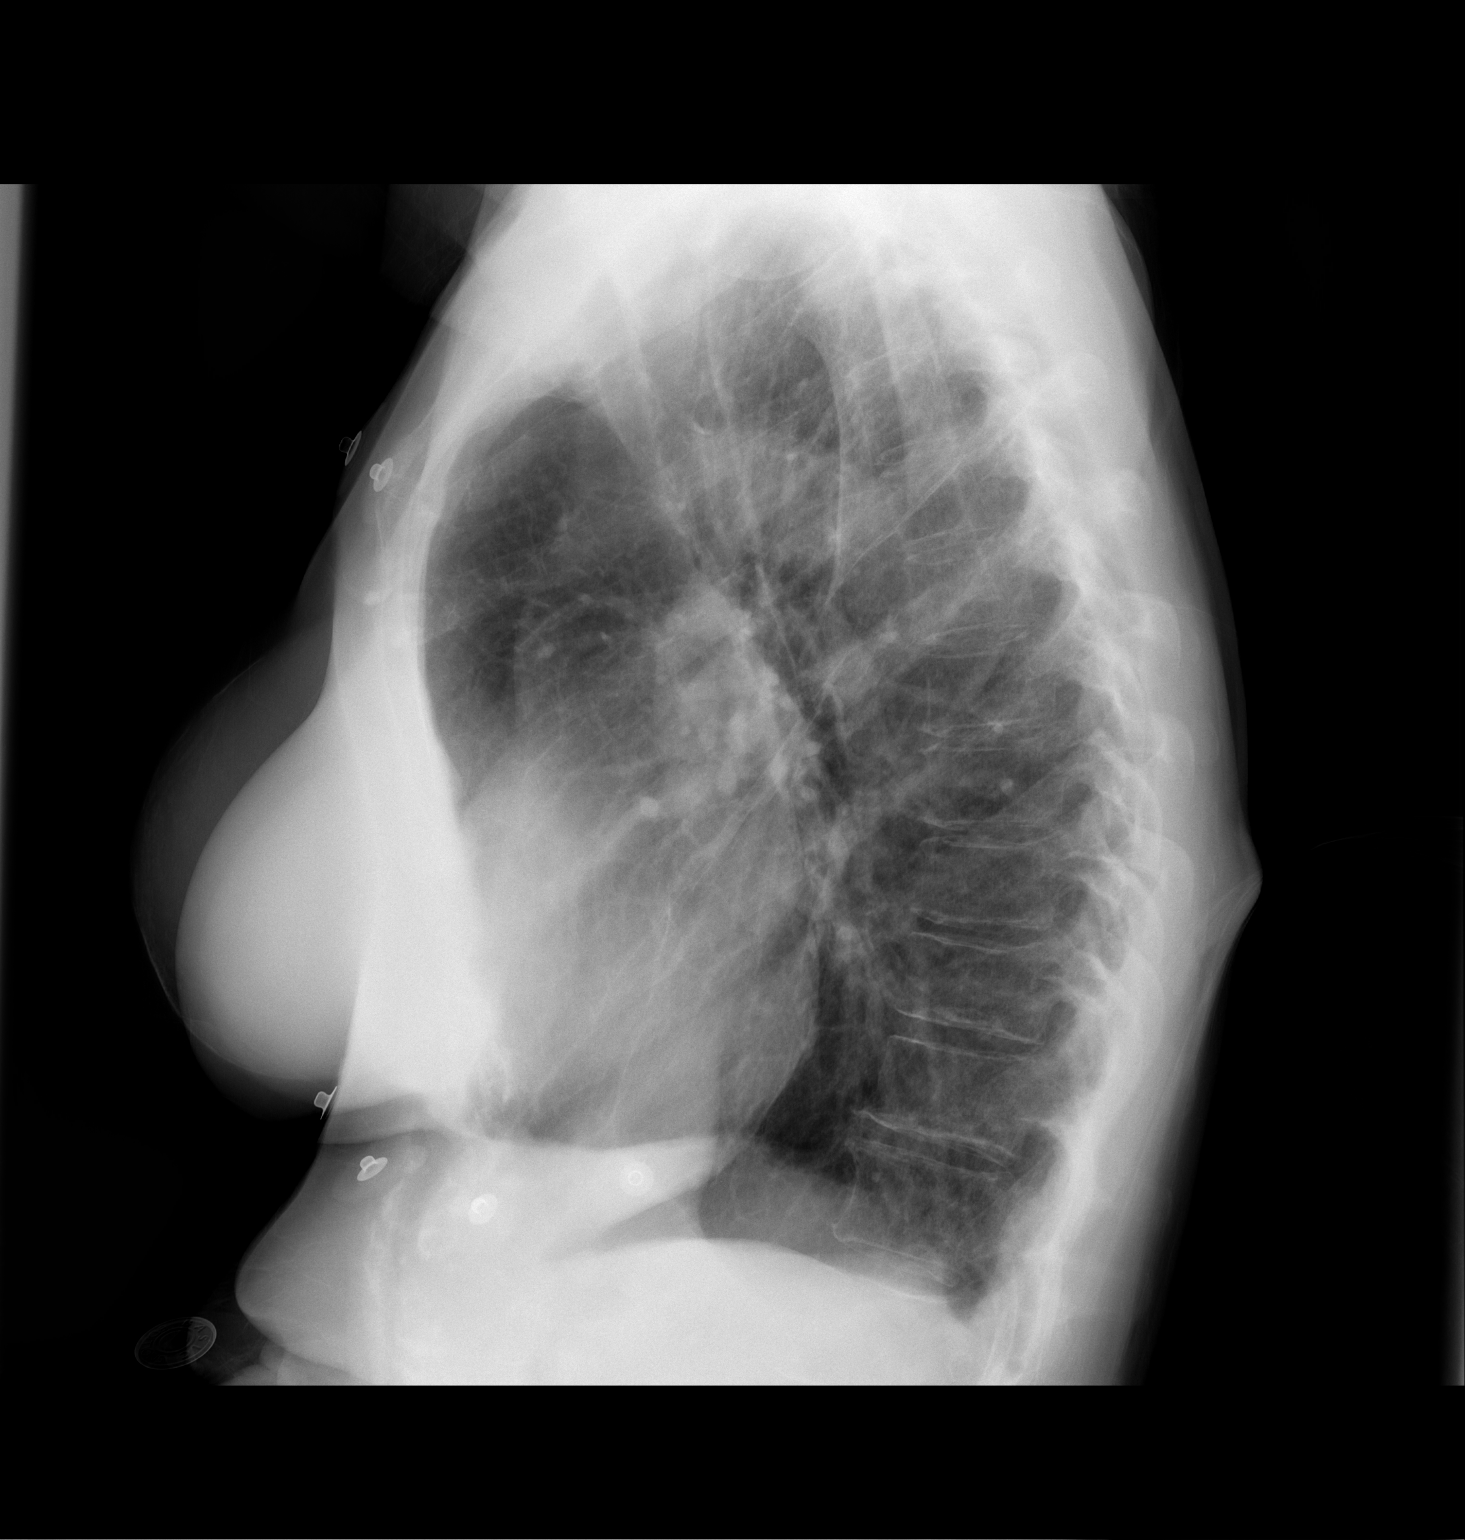

[2 of 2 positions shown; findings below may reference images not displayed]

FINDINGS: Cardiac silhouette normal in size, unchanged.  Thoracic
aorta atherosclerotic, unchanged.  Hilar and mediastinal contours
otherwise unremarkable.  Chronic pleuroparenchymal scarring at the
right lung base, unchanged.  Chronic mild biapical
pleuroparenchymal scarring, unchanged.  Emphysematous changes
throughout both lungs with hyperinflation, unchanged.  No new
pulmonary parenchymal abnormalities.  Mild degenerative changes
involving the mid thoracic spine.
IMPRESSION: COPD/emphysema.  Right basilar pleuroparenchymal scarring.  No
acute cardiopulmonary disease.

## 2013-10-31 ENCOUNTER — Other Ambulatory Visit: Payer: Self-pay | Admitting: Family Medicine

## 2013-10-31 NOTE — Telephone Encounter (Signed)
Electronic refill request. Last Filled:   90 tablet 3 RF on 07/07/2013.  Please advise.

## 2013-11-01 NOTE — Telephone Encounter (Signed)
rx called into pharmacy

## 2013-11-01 NOTE — Telephone Encounter (Signed)
Please call in.  Thanks.   

## 2013-12-05 ENCOUNTER — Inpatient Hospital Stay: Payer: Self-pay | Admitting: Internal Medicine

## 2013-12-05 LAB — BASIC METABOLIC PANEL
Anion Gap: 7 (ref 7–16)
BUN: 23 mg/dL — AB (ref 7–18)
CALCIUM: 10 mg/dL (ref 8.5–10.1)
CHLORIDE: 102 mmol/L (ref 98–107)
CREATININE: 1.05 mg/dL (ref 0.60–1.30)
Co2: 31 mmol/L (ref 21–32)
EGFR (African American): >60
GFR CALC NON AF AMER: 54 — AB
Glucose: 134 mg/dL — ABNORMAL HIGH (ref 65–99)
Osmolality: 285 (ref 275–301)
Potassium: 5.2 mmol/L — ABNORMAL HIGH (ref 3.5–5.1)
SODIUM: 140 mmol/L (ref 136–145)

## 2013-12-05 LAB — CBC
HCT: 47 % (ref 35.0–47.0)
HGB: 14.8 g/dL (ref 12.0–16.0)
MCH: 28.4 pg (ref 26.0–34.0)
MCHC: 31.5 g/dL — AB (ref 32.0–36.0)
MCV: 90 fL (ref 80–100)
Platelet: 267 10*3/uL (ref 150–440)
RBC: 5.21 10*6/uL — AB (ref 3.80–5.20)
RDW: 14.4 % (ref 11.5–14.5)
WBC: 13.7 10*3/uL — ABNORMAL HIGH (ref 3.6–11.0)

## 2013-12-05 LAB — PROTIME-INR
INR: 0.9
PROTHROMBIN TIME: 12.2 s (ref 11.5–14.7)

## 2013-12-05 LAB — APTT: Activated PTT: 28.3 secs (ref 23.6–35.9)

## 2013-12-05 LAB — TROPONIN I
TROPONIN-I: 0.51 ng/mL — AB
Troponin-I: 0.06 ng/mL — ABNORMAL HIGH
Troponin-I: 0.6 ng/mL — ABNORMAL HIGH

## 2013-12-05 LAB — CK-MB
CK-MB: 13.3 ng/mL — ABNORMAL HIGH (ref 0.5–3.6)
CK-MB: 14.2 ng/mL — ABNORMAL HIGH (ref 0.5–3.6)

## 2013-12-05 LAB — POTASSIUM: Potassium: 4.3 mmol/L (ref 3.5–5.1)

## 2013-12-05 LAB — HEPARIN LEVEL (UNFRACTIONATED): Anti-Xa(Unfractionated): 0.64 IU/mL (ref 0.30–0.70)

## 2013-12-06 LAB — CBC WITH DIFFERENTIAL/PLATELET
BASOS PCT: 0.2 %
Basophil #: 0 10*3/uL (ref 0.0–0.1)
Eosinophil #: 0 10*3/uL (ref 0.0–0.7)
Eosinophil %: 0 %
HCT: 41.8 % (ref 35.0–47.0)
HGB: 13.5 g/dL (ref 12.0–16.0)
LYMPHS PCT: 7.4 %
Lymphocyte #: 0.7 10*3/uL — ABNORMAL LOW (ref 1.0–3.6)
MCH: 28.7 pg (ref 26.0–34.0)
MCHC: 32.3 g/dL (ref 32.0–36.0)
MCV: 89 fL (ref 80–100)
Monocyte #: 0.1 x10 3/mm — ABNORMAL LOW (ref 0.2–0.9)
Monocyte %: 0.6 %
Neutrophil #: 9 10*3/uL — ABNORMAL HIGH (ref 1.4–6.5)
Neutrophil %: 91.8 %
Platelet: 236 10*3/uL (ref 150–440)
RBC: 4.69 10*6/uL (ref 3.80–5.20)
RDW: 14.4 % (ref 11.5–14.5)
WBC: 9.8 10*3/uL (ref 3.6–11.0)

## 2013-12-06 LAB — HEPARIN LEVEL (UNFRACTIONATED): Anti-Xa(Unfractionated): 0.61 IU/mL (ref 0.30–0.70)

## 2013-12-06 LAB — BASIC METABOLIC PANEL
ANION GAP: 6 — AB (ref 7–16)
BUN: 16 mg/dL (ref 7–18)
CALCIUM: 8.9 mg/dL (ref 8.5–10.1)
CO2: 27 mmol/L (ref 21–32)
Chloride: 104 mmol/L (ref 98–107)
Creatinine: 0.83 mg/dL (ref 0.60–1.30)
EGFR (African American): >60
EGFR (Non-African Amer.): >60
Glucose: 152 mg/dL — ABNORMAL HIGH (ref 65–99)
Osmolality: 278 (ref 275–301)
Potassium: 4.3 mmol/L (ref 3.5–5.1)
SODIUM: 137 mmol/L (ref 136–145)

## 2013-12-06 LAB — LIPID PANEL
CHOLESTEROL: 128 mg/dL (ref 0–200)
HDL: 67 mg/dL — AB (ref 40–60)
Ldl Cholesterol, Calc: 51 mg/dL (ref 0–100)
Triglycerides: 49 mg/dL (ref 0–200)
VLDL CHOLESTEROL, CALC: 10 mg/dL (ref 5–40)

## 2013-12-06 LAB — MAGNESIUM: MAGNESIUM: 1.8 mg/dL

## 2013-12-06 LAB — CK-MB: CK-MB: 11.2 ng/mL — ABNORMAL HIGH (ref 0.5–3.6)

## 2013-12-17 ENCOUNTER — Encounter: Payer: Self-pay | Admitting: Family Medicine

## 2013-12-17 DIAGNOSIS — I214 Non-ST elevation (NSTEMI) myocardial infarction: Secondary | ICD-10-CM | POA: Insufficient documentation

## 2013-12-17 DIAGNOSIS — I2510 Atherosclerotic heart disease of native coronary artery without angina pectoris: Secondary | ICD-10-CM | POA: Insufficient documentation

## 2013-12-20 ENCOUNTER — Ambulatory Visit (INDEPENDENT_AMBULATORY_CARE_PROVIDER_SITE_OTHER): Payer: Medicare Other

## 2013-12-20 DIAGNOSIS — Z23 Encounter for immunization: Secondary | ICD-10-CM

## 2013-12-25 ENCOUNTER — Ambulatory Visit (INDEPENDENT_AMBULATORY_CARE_PROVIDER_SITE_OTHER): Payer: Medicare Other | Admitting: Family Medicine

## 2013-12-25 ENCOUNTER — Encounter: Payer: Self-pay | Admitting: Family Medicine

## 2013-12-25 VITALS — BP 130/78 | HR 82 | Temp 98.1°F | Wt 170.5 lb

## 2013-12-25 DIAGNOSIS — T148XXA Other injury of unspecified body region, initial encounter: Secondary | ICD-10-CM

## 2013-12-25 DIAGNOSIS — I2510 Atherosclerotic heart disease of native coronary artery without angina pectoris: Secondary | ICD-10-CM

## 2013-12-25 DIAGNOSIS — J449 Chronic obstructive pulmonary disease, unspecified: Secondary | ICD-10-CM

## 2013-12-25 MED ORDER — NITROGLYCERIN 0.6 MG SL SUBL: 0.6000 mg | SUBLINGUAL_TABLET | SUBLINGUAL | Status: DC | PRN

## 2013-12-25 NOTE — Progress Notes (Signed)
Pre visit review using our clinic review tool, if applicable. No additional management support is needed unless otherwise documented below in the visit note.  To recap recent [facility] admission, she was seen at ER, was seen by cards, heart cath by Dr. Nehemiah Massed.  No new stent placed. She may have repeat cath done later in the year per cards.  She hasn't had recurrent jaw/chest pain.  She did rule in for NSTEMI during the hospitalization.    COPD.  She is on QOD prednisone now.  She has followed with Dr. Raul Del re: pulmonary status.  She has tapered to QOD dosing recently.    She had some R medial lower shin tender bruises x2 noted.  Also with bruising on B arms.  She didn't know if she should be alarmed about the bruising on her legs.    Meds, vitals, and allergies reviewed.   ROS: See HPI.  Otherwise, noncontributory.  nad ncat Mmm rrr Extensor side of arms with bruising B.  R medial lower shin with tender bruise x2 noted.  Coarse BS, louder with exp.  No focal dec in BS

## 2013-12-25 NOTE — Patient Instructions (Signed)
The bruising is likely from the aspirin and plavix. I wouldn't change your meds at this point.  Take care.

## 2013-12-26 NOTE — Assessment & Plan Note (Signed)
Likely med related and with recent cards events I wouldn't change her meds.  Not pathologic.  D/w pt.  She agrees.  No bruising noted on flexor side of arms.  F/u prn.

## 2013-12-26 NOTE — Assessment & Plan Note (Signed)
With O2 qhs and prn during the day.  She'll f/u with pulmonary.

## 2013-12-26 NOTE — Assessment & Plan Note (Signed)
She'll f/u with cards.  No CP now.

## 2013-12-29 ENCOUNTER — Other Ambulatory Visit: Payer: Self-pay | Admitting: Family Medicine

## 2013-12-29 MED ORDER — NITROGLYCERIN 0.4 MG SL SUBL: 0.4000 mg | SUBLINGUAL_TABLET | SUBLINGUAL | Status: AC | PRN

## 2014-01-04 ENCOUNTER — Other Ambulatory Visit: Payer: Self-pay

## 2014-01-04 NOTE — Telephone Encounter (Signed)
Pt request refills for atrovastatin and plavix to [facility] [address]. Christy at [facility] said hospitalist did original rx for these 2 meds. Please advise.

## 2014-01-05 MED ORDER — ATORVASTATIN CALCIUM 20 MG PO TABS: 20.0000 mg | ORAL_TABLET | Freq: Every day | ORAL | Status: DC

## 2014-01-05 MED ORDER — CLOPIDOGREL BISULFATE 75 MG PO TABS: 75.0000 mg | ORAL_TABLET | Freq: Every day | ORAL | Status: DC

## 2014-01-05 NOTE — Telephone Encounter (Signed)
Sent. thanks

## 2014-02-21 ENCOUNTER — Ambulatory Visit: Payer: Self-pay | Admitting: Specialist

## 2014-03-14 IMAGING — US ABDOMEN ULTRASOUND LIMITED
1 series · 13 of 25 positions shown · non-contrast
Comparison: none

REASON FOR EXAM: [REDACTED] GENERAL  Abd pain
COMMENTS:

[Series 1: abdomen ultrasound limited · 0.17mm/px · 13 of 99 slices shown]
[im 1/99]
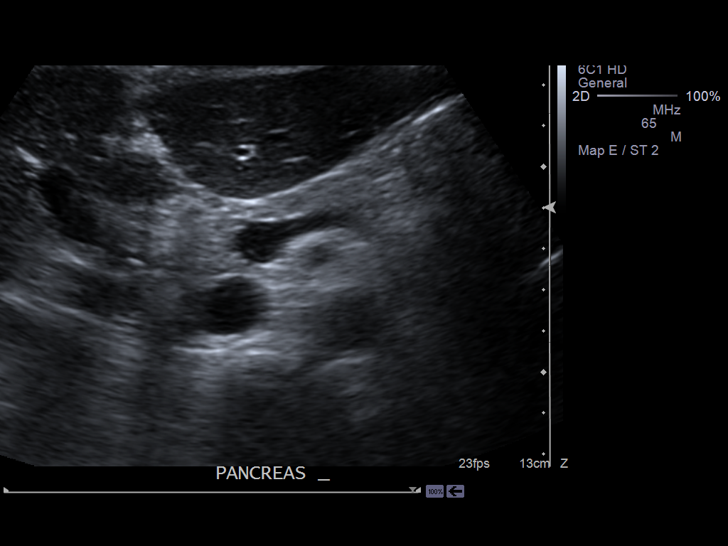
[im 9/99]
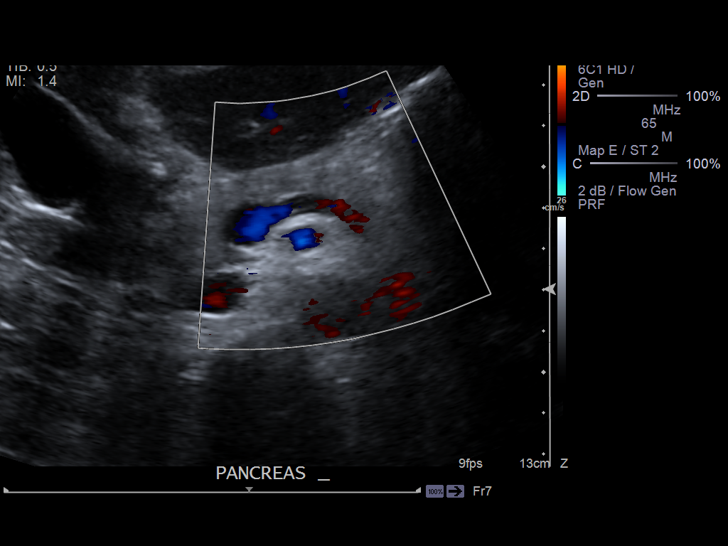
[im 17/99]
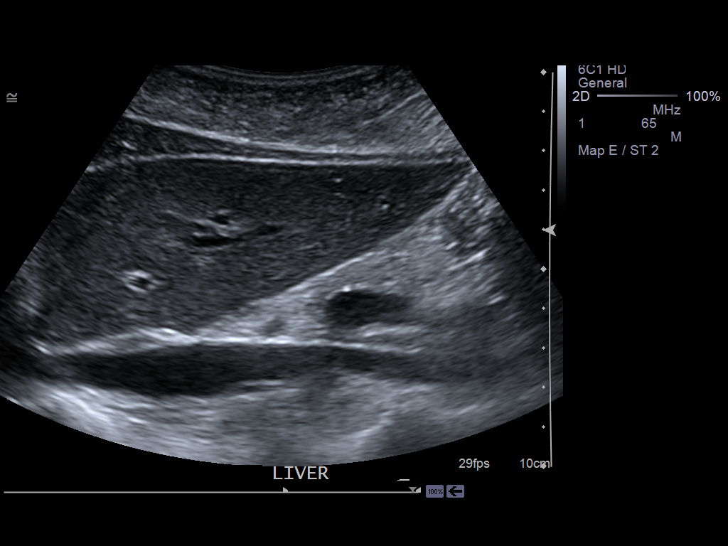
[im 25/99]
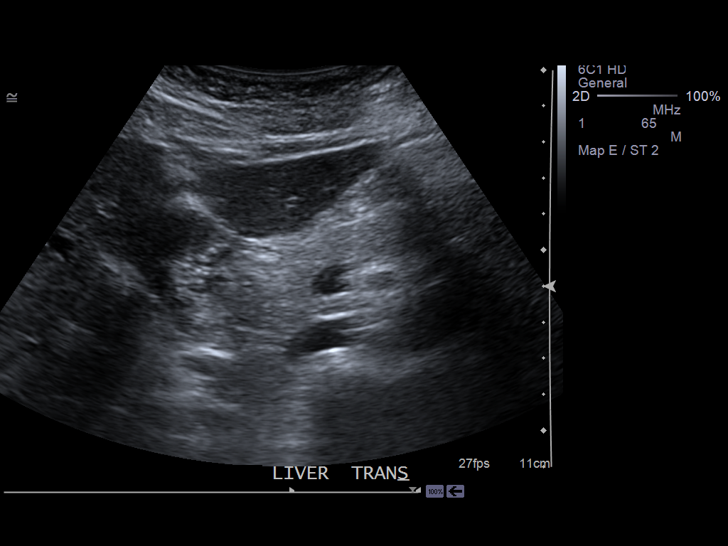
[im 33/99]
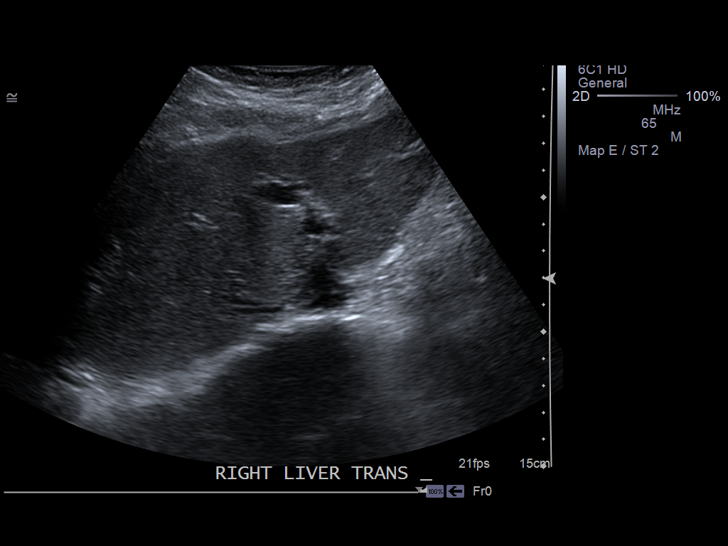
[im 41/99]
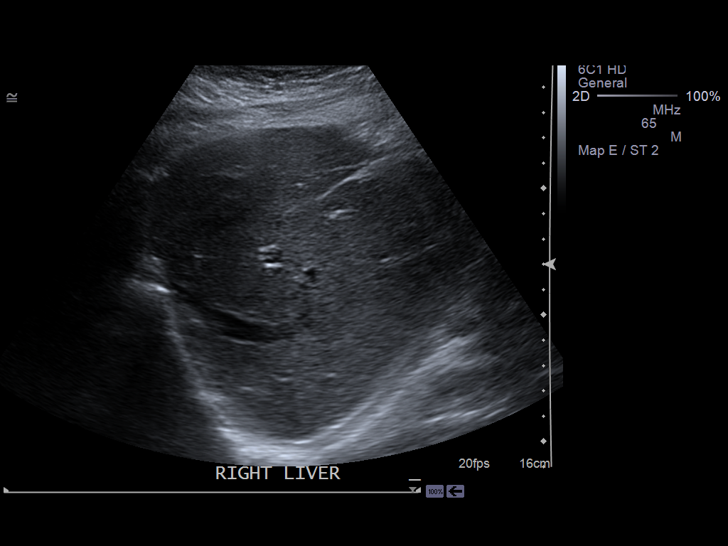
[im 50/99]
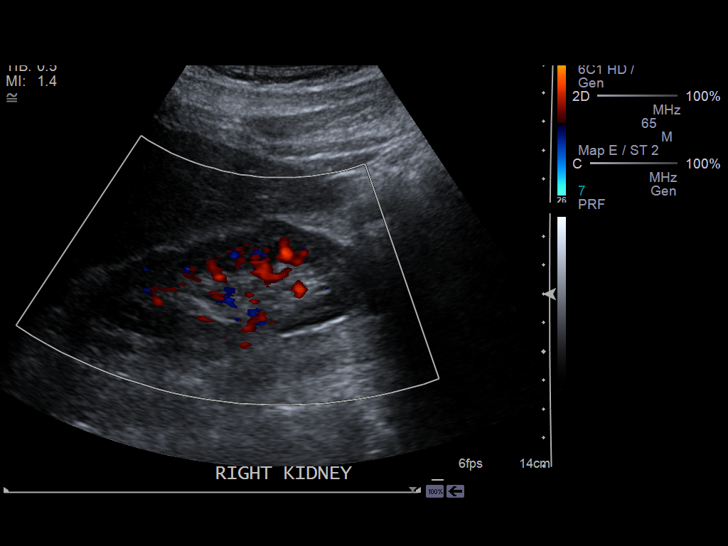
[im 58/99]
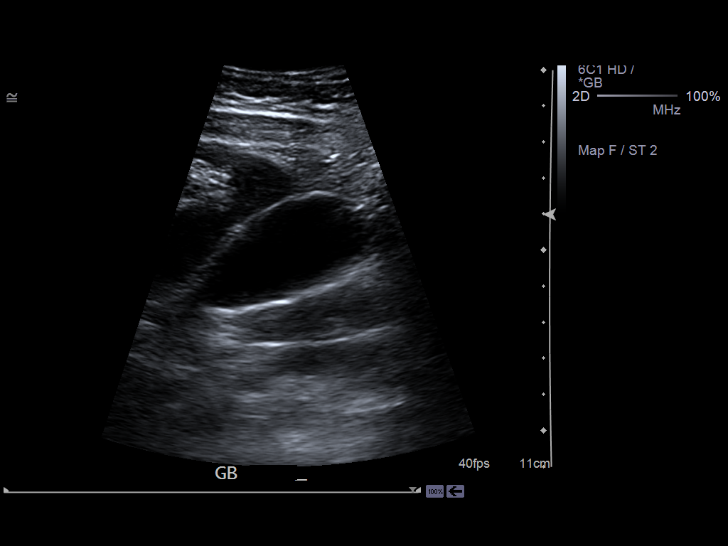
[im 66/99]
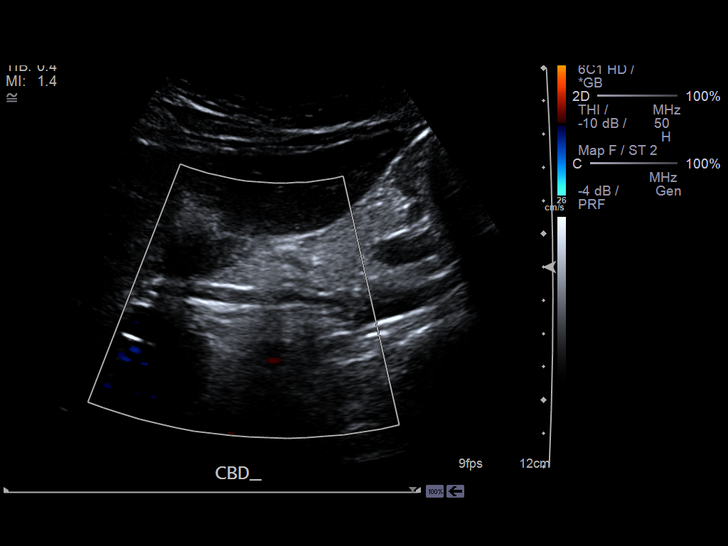
[im 74/99]
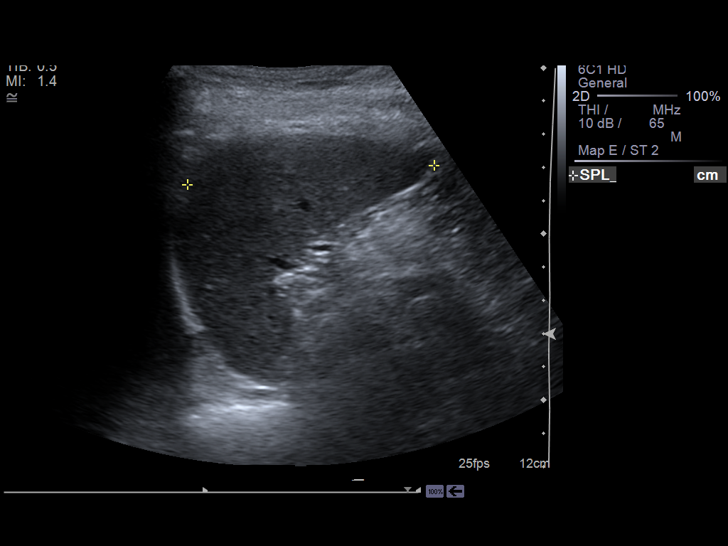
[im 82/99]
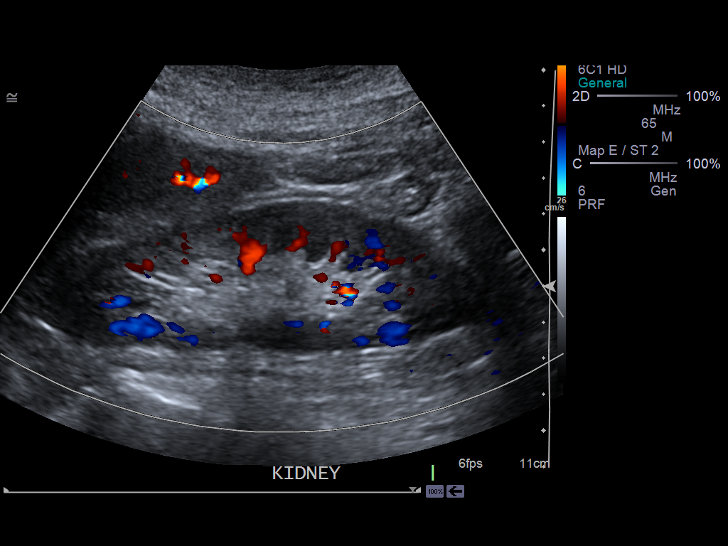
[im 90/99]
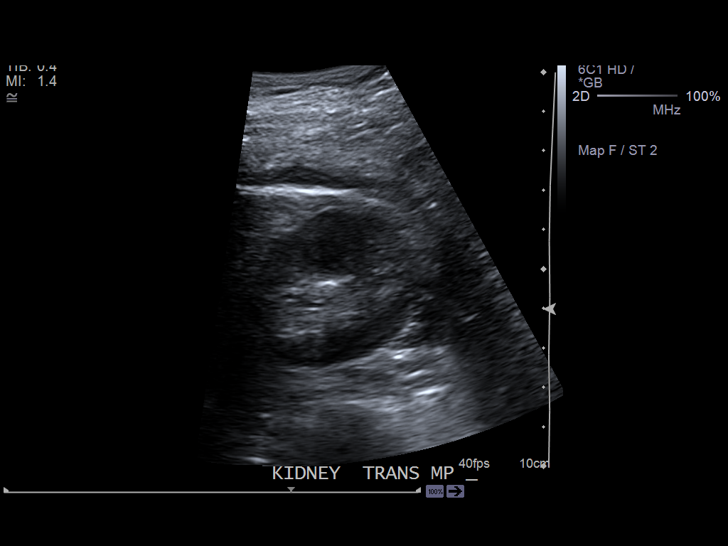
[im 99/99]
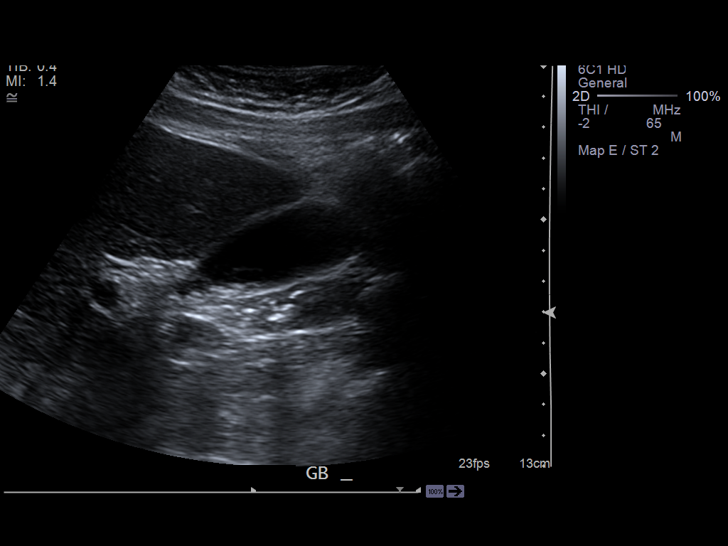

[13 of 25 positions shown; findings below may reference images not displayed]

PROCEDURE:     [facility] - [facility] ABDOMEN UPPER GENERAL  - December 01, 2012  [DATE]

RESULT:     Abdominal ultrasound is performed in the standard fashion.
Comparison is made to the study of 05/06/2011. The pancreas included on the
study appears normal. The abdominal aorta is normal in caliber without
evidence of aneurysm. The proximal inferior vena cava is patent. There is an
anechoic area within the left lobe of the liver fairly centrally measuring
1.86 x 1.71 x 0.96 cm consistent with a cyst. The right hepatic lobe
echotexture is normal. The overall liver length is 13.30 cm. The spleen is
normal at 7.44 cm with a homogeneous echotexture. No gallstones are evident.
The gallbladder wall thickness is 1.1 mm. The portal venous flow is normal.
Common bile duct diameter is 3.9 mm. No ascites is demonstrated. The kidneys
are normal in size with the right kidney measuring 10.0 x 4.12 x 3.95 cm.
The left kidney is 11.81 x 4.14 x 3.98 cm. There is an echogenic area
measuring 6 mm in length in the mid left kidney suggestive of a stone. Small
stone in the upper pole the right kidney is not excluded. Sonographic
Murphy's sign is negative.
IMPRESSION: Left nephrolithiasis. Otherwise, normal appearing abdominal
ultrasound.

[REDACTED]

## 2014-03-21 ENCOUNTER — Other Ambulatory Visit: Payer: Self-pay | Admitting: Family Medicine

## 2014-03-21 NOTE — Telephone Encounter (Signed)
Received refill request electronically from pharmacy. Last refill 11/01/13 #90/ 3 refills, last office visit 11/01/13. Is it okay to refill medication?

## 2014-03-22 NOTE — Telephone Encounter (Signed)
Rx called in as prescribed 

## 2014-03-22 NOTE — Telephone Encounter (Signed)
Please call in.  Thanks.   

## 2014-03-25 ENCOUNTER — Emergency Department: Payer: Self-pay | Admitting: Emergency Medicine

## 2014-03-25 LAB — CBC
HCT: 42.9 % (ref 35.0–47.0)
HGB: 13.7 g/dL (ref 12.0–16.0)
MCH: 29 pg (ref 26.0–34.0)
MCHC: 31.9 g/dL — ABNORMAL LOW (ref 32.0–36.0)
MCV: 91 fL (ref 80–100)
PLATELETS: 230 10*3/uL (ref 150–440)
RBC: 4.71 10*6/uL (ref 3.80–5.20)
RDW: 13 % (ref 11.5–14.5)
WBC: 13 10*3/uL — ABNORMAL HIGH (ref 3.6–11.0)

## 2014-03-25 LAB — URINALYSIS, COMPLETE
BILIRUBIN, UR: NEGATIVE
Bacteria: NONE SEEN
Glucose,UR: NEGATIVE mg/dL (ref 0–75)
KETONE: NEGATIVE
Nitrite: NEGATIVE
PROTEIN: NEGATIVE
Ph: 5 (ref 4.5–8.0)
Specific Gravity: 1.016 (ref 1.003–1.030)
Squamous Epithelial: <1
WBC UR: 10 /HPF (ref 0–5)

## 2014-03-25 LAB — COMPREHENSIVE METABOLIC PANEL
ALBUMIN: 3.2 g/dL — AB (ref 3.4–5.0)
ALK PHOS: 85 U/L
ALT: 32 U/L
Anion Gap: 6 — ABNORMAL LOW (ref 7–16)
BILIRUBIN TOTAL: 0.5 mg/dL (ref 0.2–1.0)
BUN: 18 mg/dL (ref 7–18)
CO2: 28 mmol/L (ref 21–32)
Calcium, Total: 9.1 mg/dL (ref 8.5–10.1)
Chloride: 104 mmol/L (ref 98–107)
Creatinine: 0.84 mg/dL (ref 0.60–1.30)
EGFR (Non-African Amer.): >60
GLUCOSE: 93 mg/dL (ref 65–99)
Osmolality: 277 (ref 275–301)
Potassium: 4.1 mmol/L (ref 3.5–5.1)
SGOT(AST): 34 U/L (ref 15–37)
Sodium: 138 mmol/L (ref 136–145)
TOTAL PROTEIN: 7.1 g/dL (ref 6.4–8.2)

## 2014-03-25 LAB — LIPASE, BLOOD: Lipase: 225 U/L (ref 73–393)

## 2014-04-02 DIAGNOSIS — I10 Essential (primary) hypertension: Secondary | ICD-10-CM | POA: Diagnosis not present

## 2014-04-02 DIAGNOSIS — I493 Ventricular premature depolarization: Secondary | ICD-10-CM | POA: Diagnosis not present

## 2014-04-02 DIAGNOSIS — I214 Non-ST elevation (NSTEMI) myocardial infarction: Secondary | ICD-10-CM | POA: Diagnosis not present

## 2014-04-02 DIAGNOSIS — I2510 Atherosclerotic heart disease of native coronary artery without angina pectoris: Secondary | ICD-10-CM | POA: Diagnosis not present

## 2014-04-11 DIAGNOSIS — R0602 Shortness of breath: Secondary | ICD-10-CM | POA: Diagnosis not present

## 2014-04-12 ENCOUNTER — Encounter: Payer: Self-pay | Admitting: Family Medicine

## 2014-04-12 ENCOUNTER — Ambulatory Visit (INDEPENDENT_AMBULATORY_CARE_PROVIDER_SITE_OTHER): Payer: Medicare Other | Admitting: Family Medicine

## 2014-04-12 VITALS — BP 142/78 | HR 92 | Temp 97.7°F | Wt 174.5 lb

## 2014-04-12 DIAGNOSIS — J449 Chronic obstructive pulmonary disease, unspecified: Secondary | ICD-10-CM

## 2014-04-12 DIAGNOSIS — E538 Deficiency of other specified B group vitamins: Secondary | ICD-10-CM

## 2014-04-12 DIAGNOSIS — J441 Chronic obstructive pulmonary disease with (acute) exacerbation: Secondary | ICD-10-CM | POA: Diagnosis not present

## 2014-04-12 MED ORDER — LEVOFLOXACIN 500 MG PO TABS: 500.0000 mg | ORAL_TABLET | Freq: Every day | ORAL | Status: DC

## 2014-04-12 NOTE — Progress Notes (Signed)
Pre visit review using our clinic review tool, if applicable. No additional management support is needed unless otherwise documented below in the visit note.  On O2 at baseline, 2L, on 10mg  pred a day per pulmonary.  Last seen by pulm about 1.5 months ago.   Recently with cough, worse recently.  Hoarse from coughing.  More sputum recently, discolored, thick.  No fevers.  Tired.  Some wheeze, some better with inhalers.  On O2 for exertion, not sleeping with it.    H/o B12 def.  Asked about f/u lab.  Pending.   PMH and SH reviewed, PMH noted for h/o severe COPD followed by Dr. Raul Del.    ROS: See HPI, otherwise noncontributory.  Meds, vitals, and allergies reviewed.   Nad, on O2, chronically ill appearing woman, but not acutely ill Nasal and OP exam wnl Neck supple, no LA rrr Global dec in BS with occ exp wheezes and scattered rhonchi but no inc in wob abd soft Ext w/o edema

## 2014-04-12 NOTE — Patient Instructions (Signed)
Go to the lab on the way out.  We'll contact you with your lab report. Start levaquin today, continue your inhalers, and take an extra dose of prednisone for the next 5 days (you'll increase to 20mg  a day for 5 days, then cut back to 10mg  a day). Take care.

## 2014-04-13 ENCOUNTER — Encounter: Payer: Self-pay | Admitting: Family Medicine

## 2014-04-13 LAB — VITAMIN B12: Vitamin B-12: 211 pg/mL (ref 211–911)

## 2014-04-13 NOTE — Assessment & Plan Note (Signed)
F/u level pending.  See notes on labs.

## 2014-04-13 NOTE — Assessment & Plan Note (Addendum)
New problem, ie this exacerbation, overall worsened from baseline.  Still okay for outpatient f/u.  Inc pred to 20mg  a day for 5 days, then return to 10mg  a day, GI caution, and start levaquin in the meantime.  Update [place] as needed.  Still speaking in complete sentences.  Has O2 for home.

## 2014-04-15 DIAGNOSIS — G4733 Obstructive sleep apnea (adult) (pediatric): Secondary | ICD-10-CM | POA: Diagnosis not present

## 2014-04-15 DIAGNOSIS — J449 Chronic obstructive pulmonary disease, unspecified: Secondary | ICD-10-CM | POA: Diagnosis not present

## 2014-04-15 DIAGNOSIS — I509 Heart failure, unspecified: Secondary | ICD-10-CM | POA: Diagnosis not present

## 2014-04-16 DIAGNOSIS — K229 Disease of esophagus, unspecified: Secondary | ICD-10-CM | POA: Insufficient documentation

## 2014-04-16 DIAGNOSIS — I2510 Atherosclerotic heart disease of native coronary artery without angina pectoris: Secondary | ICD-10-CM | POA: Diagnosis not present

## 2014-04-16 DIAGNOSIS — I214 Non-ST elevation (NSTEMI) myocardial infarction: Secondary | ICD-10-CM | POA: Diagnosis not present

## 2014-04-16 DIAGNOSIS — I10 Essential (primary) hypertension: Secondary | ICD-10-CM | POA: Diagnosis not present

## 2014-04-16 DIAGNOSIS — I493 Ventricular premature depolarization: Secondary | ICD-10-CM | POA: Diagnosis not present

## 2014-04-23 ENCOUNTER — Telehealth: Payer: Self-pay | Admitting: Family Medicine

## 2014-04-23 NOTE — Telephone Encounter (Signed)
[facility] Patient Name: Mckenzie Mclaughlin DOB: [birth year] Initial Comment Caller states she has a cough and cold; she was given antibiotics but she did not take them. Nurse Assessment Nurse: Markus Daft, RN, Sherre Poot Date/Time [name] Time): 04/23/2014 3:08:08 PM Confirm and document reason for call. If symptomatic, describe symptoms. ---Caller states she has a productive cough with tan phlegm, hoarseness, and cold s/s which started up last week. She was seen by MD last week on Friday approx. She was given antibiotics, Levofloxacin 500 mg QD, but she did not take them d/t 5 pages of S.E. -- She has COPD and at 2:30 pm she used her ProAir Inhaler which helped. Sometimes with wheezing, and just a little right now. -- Allergy: Amoxicillin. Has the patient traveled out of the country within the last 30 days? ---Not Applicable Does the patient require triage? ---Yes Related visit to physician within the last 2 weeks? ---Yes Does the PT have any chronic conditions? (i.e. diabetes, asthma, etc.) ---Yes List chronic conditions. ---Punctured lung 2 years ago during a lung biopsy - had a spot on her lung which ended up being an infection; COPD; chronic cough; wears O2; Stent in heart; CAD; "minor MI" 2-3 months ago. Guidelines Guideline Title Affirmed Question Affirmed Notes Cough - Chronic [1] Blood-tinged sputum has been coughed up AND [2] more than once twice; also had a nose bleed several days ago Final Disposition User See PCP When Office is Open (within 3 days) Markus Daft, Therapist, sports, [employer] states that she already has an appt with Dr. Damita Dunnings tomorrow. -- Caller and husband do not want to take the medicine. RN advised that they discuss with MD tomorrow. Caller verb. understanding.

## 2014-04-23 NOTE — Telephone Encounter (Signed)
Closed encounter in error 

## 2014-04-23 NOTE — Telephone Encounter (Signed)
Will see tomorrow

## 2014-04-24 ENCOUNTER — Ambulatory Visit (INDEPENDENT_AMBULATORY_CARE_PROVIDER_SITE_OTHER): Payer: Medicare Other | Admitting: Family Medicine

## 2014-04-24 ENCOUNTER — Encounter: Payer: Self-pay | Admitting: Family Medicine

## 2014-04-24 VITALS — BP 150/80 | HR 89 | Temp 97.4°F | Wt 177.5 lb

## 2014-04-24 DIAGNOSIS — G4733 Obstructive sleep apnea (adult) (pediatric): Secondary | ICD-10-CM | POA: Diagnosis not present

## 2014-04-24 DIAGNOSIS — I509 Heart failure, unspecified: Secondary | ICD-10-CM | POA: Diagnosis not present

## 2014-04-24 DIAGNOSIS — J441 Chronic obstructive pulmonary disease with (acute) exacerbation: Secondary | ICD-10-CM | POA: Diagnosis not present

## 2014-04-24 DIAGNOSIS — J449 Chronic obstructive pulmonary disease, unspecified: Secondary | ICD-10-CM | POA: Diagnosis not present

## 2014-04-24 NOTE — Assessment & Plan Note (Addendum)
Prev note reviewed, severe COPD, O2 dependent, chronic steroid use, h/o intolerance to beta lactam and doxy. Still with tan sputum. She was worried about taking levaquin. Discussed.  She has tolerated cipro prev.  She felt better about the rationale after our discussion.  She wanted to try to take levaquin 1/2 tab x2 nights then 1 tab q night.  That would be reasonable and allow for normalized diet that wouldn't interfere with the med.  Inc pred in meantime.  She agrees.  Update me as needed.

## 2014-04-24 NOTE — Patient Instructions (Signed)
I would double your prednisone for 5 days and star the levaquin in the meantime.  Let me know if you have troubles.  Take care.

## 2014-04-24 NOTE — Progress Notes (Signed)
Pre visit review using our clinic review tool, if applicable. No additional management support is needed unless otherwise documented below in the visit note. Patient states "I wasn't scared, I was terrified of that medication that he gave me (Levaquin)" so she didn't take it.  Mike Craze, CMA  Prev note reviewed, severe COPD, O2 dependent, chronic steroid use, h/o intolerance to beta lactam and doxy.  Still with tan sputum.  She was worried about taking levaquin.  Discussed.   Recheck BP 150/80   Meds, vitals, and allergies reviewed.   ROS: See HPI.  Otherwise, noncontributory.  Nad, on O2, chronically ill appearing woman, but not acutely ill Nasal and OP exam wnl Neck supple, no LA rrr Global dec in BS with occ exp wheezes and scattered rhonchi but no inc in wob abd soft Ext w/o edema

## 2014-05-10 DIAGNOSIS — J449 Chronic obstructive pulmonary disease, unspecified: Secondary | ICD-10-CM | POA: Diagnosis not present

## 2014-05-10 DIAGNOSIS — R911 Solitary pulmonary nodule: Secondary | ICD-10-CM | POA: Diagnosis not present

## 2014-05-10 DIAGNOSIS — R05 Cough: Secondary | ICD-10-CM | POA: Diagnosis not present

## 2014-05-10 DIAGNOSIS — R0602 Shortness of breath: Secondary | ICD-10-CM | POA: Diagnosis not present

## 2014-05-16 DIAGNOSIS — G4733 Obstructive sleep apnea (adult) (pediatric): Secondary | ICD-10-CM | POA: Diagnosis not present

## 2014-05-16 DIAGNOSIS — J449 Chronic obstructive pulmonary disease, unspecified: Secondary | ICD-10-CM | POA: Diagnosis not present

## 2014-05-16 DIAGNOSIS — I509 Heart failure, unspecified: Secondary | ICD-10-CM | POA: Diagnosis not present

## 2014-05-25 DIAGNOSIS — I509 Heart failure, unspecified: Secondary | ICD-10-CM | POA: Diagnosis not present

## 2014-05-25 DIAGNOSIS — G4733 Obstructive sleep apnea (adult) (pediatric): Secondary | ICD-10-CM | POA: Diagnosis not present

## 2014-05-25 DIAGNOSIS — J449 Chronic obstructive pulmonary disease, unspecified: Secondary | ICD-10-CM | POA: Diagnosis not present

## 2014-06-14 DIAGNOSIS — G4733 Obstructive sleep apnea (adult) (pediatric): Secondary | ICD-10-CM | POA: Diagnosis not present

## 2014-06-14 DIAGNOSIS — J449 Chronic obstructive pulmonary disease, unspecified: Secondary | ICD-10-CM | POA: Diagnosis not present

## 2014-06-14 DIAGNOSIS — I509 Heart failure, unspecified: Secondary | ICD-10-CM | POA: Diagnosis not present

## 2014-06-18 ENCOUNTER — Other Ambulatory Visit: Payer: Self-pay | Admitting: Family Medicine

## 2014-06-18 MED ORDER — BUDESONIDE-FORMOTEROL FUMARATE 160-4.5 MCG/ACT IN AERO: 2.0000 | INHALATION_SPRAY | Freq: Two times a day (BID) | RESPIRATORY_TRACT | Status: DC

## 2014-06-20 ENCOUNTER — Ambulatory Visit (INDEPENDENT_AMBULATORY_CARE_PROVIDER_SITE_OTHER): Payer: Medicare Other | Admitting: Family Medicine

## 2014-06-20 ENCOUNTER — Encounter: Payer: Self-pay | Admitting: Family Medicine

## 2014-06-20 VITALS — BP 150/80 | HR 82 | Temp 98.3°F | Wt 175.5 lb

## 2014-06-20 DIAGNOSIS — K5900 Constipation, unspecified: Secondary | ICD-10-CM | POA: Diagnosis not present

## 2014-06-20 MED ORDER — POLYETHYLENE GLYCOL 3350 17 GM/SCOOP PO POWD: 17.0000 g | Freq: Two times a day (BID) | ORAL | Status: DC | PRN

## 2014-06-20 MED ORDER — BISACODYL 10 MG RE SUPP: 10.0000 mg | RECTAL | Status: DC | PRN

## 2014-06-20 NOTE — Patient Instructions (Signed)
Use a dulcolax suppository daily and add on miralax 1-2 times a day.  When you are better, stop the dulcolax first.  You can wean off the miralax as tolerated.  Take care.  Glad to see you.

## 2014-06-20 NOTE — Progress Notes (Signed)
Pre visit review using our clinic review tool, if applicable. No additional management support is needed unless otherwise documented below in the visit note.  No BMs in 9 days.  Still passing gas.  No clear cause, other than some GI upset about 10 days ago. Both her and her husband had vomiting and diarrhea after eating fish.  No BMs since then.  No vomiting either.  She feels bloated.  Dec in appetite.  She abd bloating makes it harder to get a deep breath, so she has been SOB from that. Some cough.  Some sputum, tan, light colored.  This is typical/normal for her, in terms of quantity and quality.  No fevers.  Tried eating fruit.  No laxatives, no stool softener use.  No abd pain.    L foot got puffy in the last few days.  Still with 1+ edema.  She usually has L>R foot edema at baseline.  No injury, no ankle pain, but the foot occ feels tight from the swelling.  Swelling some better with elevation.    Meds, vitals, and allergies reviewed.   ROS: See HPI.  Otherwise, noncontributory.  nad In wheelchair, on O2 Mmm Neck supple, no LA rrr Scant B wheeze, global dec in BS, but this is likely at baseline for her.  No inc in wob abd soft, not ttp, slightly bloated Ext with L>R foot edema, as expected.

## 2014-06-21 DIAGNOSIS — K5900 Constipation, unspecified: Secondary | ICD-10-CM | POA: Insufficient documentation

## 2014-06-21 NOTE — Assessment & Plan Note (Signed)
She likely doesn't have a new/worsening pulmonary source for the SOB- it's likely from compression from her abd.  That may also be making the foot edema worse.  D/w pt about GI regimen, dulcolax and miralax.  She can use both, taper off dulcolax first.  Continue miralax if needed.  She agrees.  Still okay for outpatient f/u.  No sign of acute abd.  She agres with plan.

## 2014-06-23 DIAGNOSIS — I509 Heart failure, unspecified: Secondary | ICD-10-CM | POA: Diagnosis not present

## 2014-06-23 DIAGNOSIS — J449 Chronic obstructive pulmonary disease, unspecified: Secondary | ICD-10-CM | POA: Diagnosis not present

## 2014-06-23 DIAGNOSIS — G4733 Obstructive sleep apnea (adult) (pediatric): Secondary | ICD-10-CM | POA: Diagnosis not present

## 2014-06-29 ENCOUNTER — Other Ambulatory Visit: Payer: Self-pay | Admitting: *Deleted

## 2014-06-29 ENCOUNTER — Other Ambulatory Visit: Payer: Self-pay | Admitting: Family Medicine

## 2014-06-29 MED ORDER — ALBUTEROL SULFATE HFA 108 (90 BASE) MCG/ACT IN AERS: 2.0000 | INHALATION_SPRAY | Freq: Four times a day (QID) | RESPIRATORY_TRACT | Status: DC | PRN

## 2014-07-02 ENCOUNTER — Other Ambulatory Visit: Payer: Self-pay | Admitting: *Deleted

## 2014-07-02 MED ORDER — ALBUTEROL SULFATE HFA 108 (90 BASE) MCG/ACT IN AERS: 2.0000 | INHALATION_SPRAY | Freq: Four times a day (QID) | RESPIRATORY_TRACT | Status: DC | PRN

## 2014-07-13 ENCOUNTER — Other Ambulatory Visit: Payer: Self-pay | Admitting: Specialist

## 2014-07-13 DIAGNOSIS — R918 Other nonspecific abnormal finding of lung field: Secondary | ICD-10-CM

## 2014-07-15 DIAGNOSIS — G4733 Obstructive sleep apnea (adult) (pediatric): Secondary | ICD-10-CM | POA: Diagnosis not present

## 2014-07-15 DIAGNOSIS — I509 Heart failure, unspecified: Secondary | ICD-10-CM | POA: Diagnosis not present

## 2014-07-15 DIAGNOSIS — J449 Chronic obstructive pulmonary disease, unspecified: Secondary | ICD-10-CM | POA: Diagnosis not present

## 2014-07-18 ENCOUNTER — Other Ambulatory Visit: Payer: Self-pay | Admitting: Family Medicine

## 2014-07-18 NOTE — Telephone Encounter (Signed)
Received refill request electronically from pharmacy. Last refill 03/22/14 #90/3, last office visit 06/20/14. Is it okay to refill medication?

## 2014-07-19 NOTE — Telephone Encounter (Signed)
Please call in.  Thanks.   

## 2014-07-20 NOTE — Discharge Summary (Signed)
PATIENT NAME:  Mckenzie Mclaughlin, Mckenzie Mclaughlin MR#:  [id number] DATE OF BIRTH:  10/19/1940  DATE OF ADMISSION:  05/27/2012 DATE OF DISCHARGE:  06/02/2012  ADMITTING DIAGNOSIS: Persistent air leak.   HOSPITAL COURSE: Mckenzie Mclaughlin is a 74 year old white female who was admitted to the hospital with a persistent air leak from a previously placed chest tube at the time of a thoracoscopic talc pleurodesis. She came into the hospital to have her chest tube clamped and monitored. When the chest tube was clamped, she developed a subpulmonic pneumothorax which was subsequently treated by percutaneous drain. She then underwent doxycycline pleurodesis through the percutaneously placed drain, which she tolerated poorly. Over the next several days her chest x-ray was monitored. She had complete reexpansion of her lung, on the date of discharge. I discussed her care with Dr. Pia Mau at [facility]. He has agreed to accept the patient in transfer. The patient will be transferred with 2 chest tubes placed to Heimlich valves. She will follow up with Dr. Pia Mau upon transfer to [facility]. ____________________________ Lew Dawes. Genevive Bi, MD teo:sb D: 06/02/2012 09:46:00 ET T: 06/02/2012 09:59:05 ET JOB#: [id number]  cc: Christia Reading E. Genevive Bi, MD, <Dictator> Louis Matte MD ELECTRONICALLY SIGNED 06/04/2012 5:26

## 2014-07-20 NOTE — H&P (Signed)
PATIENT NAME:  Mckenzie Mclaughlin, Mckenzie Mclaughlin MR#:  [id number] DATE OF BIRTH:  [birth year]  DATE OF ADMISSION:  04/10/2012  REFERRING PHYSICIAN: Dr. Benjaman Lobe  FAMILY PHYSICIAN: Dr. Elsie Stain   REASON FOR ADMISSION: Chest pain with shortness of breath.   HISTORY OF PRESENT ILLNESS: The patient is a 74 year old female with a history of known coronary artery disease, status post PTCA of the right coronary artery with stent placement. The patient also has COPD and ongoing tobacco abuse, on chronic oxygen at home. She presents to the Emergency Room today with substernal chest pain described as pressure and tightness with no radiation. She has been also having some worsening shortness of breath. No nausea or vomiting. The pain is pressure-like and tight, rated 8 out of 10. It is nonradiating and not associated with exertion. Her shortness of breath has been worse, and she has been noted to have some left lower extremity edema as well. In the Emergency Room, the patient's initial EKG and troponin were unremarkable. She is now admitted for further evaluation.   PAST MEDICAL HISTORY: 1. ASCVD, status post PTCA with stent placement of the right coronary artery.  2. COPD and tobacco abuse.  3. Anemia of chronic disease.  4. Chronic low back pain.  5. Benign hypertension.  6. Hyperlipidemia.  7. Anxiety and depression.  8. History of PVCs.  9. B12 deficiency.  10. Status post hysterectomy.  11. Status post back surgery.  12. Status post bilateral mastectomies due to fibrocystic breast disease.   MEDICATIONS: 1. Zocor 10 mg p.o. daily.  2. Spiriva 1 capsule inhaled daily.  3. ProAir 2 puffs q.i.d.  4. Lopressor 25 mg p.o. daily.  5. B12 1000 mcg IM q. month.  6. Colace 100 mg p.o. b.i.d.  7. Aspirin 325 mg p.o. daily.  8. Xanax 0.5 mg p.o. b.i.d.  9. Advair 100/50, 1 puff b.i.d.   ALLERGIES: IBUPROFEN.   SOCIAL HISTORY: The patient continues to smoke about a half-pack per day. No history of alcohol  abuse.   FAMILY HISTORY: Positive for hypertension, coronary artery disease, and stroke.   REVIEW OF SYSTEMS:   CONSTITUTIONAL: No fever or change in weight.  EYES: No blurred or double vision. No glaucoma.  ENT: No tinnitus or hearing loss. No nasal discharge or bleeding. No difficulty swallowing.  RESPIRATORY: The patient has had cough but denies wheezing or hemoptysis. No painful respiration.  CARDIOVASCULAR: No orthopnea or palpitations. No syncope.  GASTROINTESTINAL: No nausea, vomiting, or diarrhea. No abdominal pain. No change in bowel habits.  GENITOURINARY: No dysuria or hematuria. No incontinence.  ENDOCRINE: No polyuria or polydipsia. No heat or cold intolerance.  HEMATOLOGIC: The patient denies anemia, easy bruising, or bleeding.  LYMPHATIC: No swollen glands.  MUSCULOSKELETAL: The patient has pain in her back but denies pain in her neck, shoulders, knees or hips. No gout.  NEUROLOGICAL: No numbness, although she does describe generalized weakness. Denies migraines, stroke or seizures.  PSYCHIATRIC: The patient denies anxiety, insomnia, or depression.   PHYSICAL EXAMINATION: GENERAL: The patient is in no acute distress.  VITAL SIGNS: Vital signs are currently remarkable for a blood pressure of 184/81, with a heart rate of 78, and a respiratory rate of 20. She is afebrile.  HEENT: Normocephalic, atraumatic. Pupils are equally round and reactive to light and accommodation. Extraocular movements are intact. Sclerae are anicteric. Conjunctivae are clear. Oropharynx is clear.   NECK: Supple without jugular venous distention. No adenopathy or thyromegaly is noted.  LUNGS: Decreased breath  sounds without wheezes or rales. No rhonchi. No dullness. Respiratory effort is normal.  CARDIAC: Regular rate and rhythm. Normal S1, S2. No significant rubs, murmurs, or gallops. PMI is nondisplaced. Chest wall is nontender.  ABDOMEN: Soft, nontender with normoactive bowel sounds. No organomegaly or  masses were appreciated. No hernias or bruits were noted.  EXTREMITIES: Without clubbing, cyanosis, or edema. Pulses were 2+ bilaterally.  SKIN: Warm and dry without rash or lesions.  NEUROLOGIC: Cranial nerves II through XII grossly intact. Deep tendon reflexes were symmetric. Motor and sensory exam was nonfocal.  PSYCHIATRIC: Exam revealed a patient who was alert and oriented to person, place, and time. She was cooperative and used good judgment.   LABORATORY, DIAGNOSTIC AND RADIOLOGICAL DATA:  CBC was within normal limits. Chemistries revealed a glucose of 94, with a BUN of 11, a creatinine of 0.62, with a sodium of 139, a potassium of 3.8, and a GFR of greater than 60. Troponin was less than 0.02.   EKG revealed sinus rhythm with no acute ischemic changes.   ASSESSMENT: 1. Chest pain associated with shortness of breath, worrisome for unstable angina.  2. Atherosclerotic cardiovascular disease, status post percutaneous transluminal coronary angioplasty with stent placement.  3. Chronic obstructive pulmonary disease and tobacco abuse.  4. Chronic respiratory failure, on oxygen.  5. Hyperlipidemia.  6. Benign hypertension.   PLAN: The patient will be observed on telemetry with Lovenox and topical nitrates. We will continue her beta blocker and aspirin therapy. We will follow serial cardiac enzymes and obtain an echocardiogram. We will consult Cardiology in regards to her chest pain and shortness of breath. We will also obtain a chest CT to rule out pulmonary embolism. Continue outpatient medications for now. Follow up routine labs in the morning. Further treatment and evaluation will depend upon the patient's progress.  TOTAL TIME SPENT: 45 minutes.   ____________________________ Leonie Douglas Doy Hutching, MD jds:cb D: 04/10/2012 13:00:50 ET T: 04/10/2012 15:47:52 ET JOB#: [id number]  cc: Leonie Douglas. Doy Hutching, MD, <Dictator> Elveria Rising. Damita Dunnings, MD Kamoni Depree Lennice Sites MD ELECTRONICALLY SIGNED 04/10/2012  16:28

## 2014-07-20 NOTE — Consult Note (Signed)
PATIENT NAME:  Mckenzie Mclaughlin, Mckenzie Mclaughlin MR#:  [id number] DATE OF BIRTH:  [birth year]  DATE OF CONSULTATION:  04/11/2012  REFERRING PHYSICIAN:  Flora Lipps, MD CONSULTING PHYSICIAN:  Lew Dawes. Emsley Custer, MD  REASON FOR CONSULTATION: Right upper lobe mass.   I have personally seen and examined Mckenzie Mclaughlin. I have independently reviewed her chart, including her x-rays.   HISTORY OF PRESENT ILLNESS: Mckenzie Mclaughlin is a 74 year old female with a known history of coronary artery disease status post PTCA of the right coronary artery with stent placement. The patient also has a history of COPD and ongoing tobacco use with chronic oxygen therapy at home. She is currently managed by Dr. Wallene Huh. She presented to the Emergency Room with substernal chest pain which she described as pressure and tightness. In addition, she has been short of breath. She denied any nausea or vomiting. Of note is that she has had diminished appetite over the last several months and has had some weight loss which has been unintentional. While in the Emergency Department, she had an EKG and troponin which were unremarkable and she was admitted for continued evaluation.   PAST MEDICAL HISTORY:  1. Coronary artery disease. 2. Chronic obstructive pulmonary disease. 3. Chronic anemia. 4. Back pain. 5. Hypertension. 6. Hyperlipidemia. 7. Anxiety.  8. Status-post bilateral mastectomy due to fibrocystic breast disease.   SOCIAL HISTORY: The patient smokes about 6 cigarettes per day. She does not have any alcohol abuse. She is retired from the [employer].   FAMILY HISTORY: Positive for hypertension, coronary artery disease and stroke. In addition, she has a father and a brother who are deceased of lung cancer.   REVIEW OF SYSTEMS: A complete review of systems was asked and was negative, except for those in the history of present illness.   PHYSICAL EXAMINATION:  GENERAL: A pleasant, well-developed and well-nourished woman in no  distress.   HEENT: Head is normocephalic, atraumatic. Pupils are equal. Conjunctiva is clear.   NECK: Supple without thyromegaly or adenopathy. There is no jugular venous distention.   PULMONARY: Her lungs show diminished breath sounds throughout without any rales or rhonchi.   CARDIOVASCULAR: Her heart was regular. There were no murmurs.   ABDOMEN: Soft, nontender and nondistended. There were no palpable masses. There was no hepatosplenomegaly.   EXTREMITIES: No clubbing, cyanosis or edema. Pulses were 2+. She had no tenderness to palpation of her lower extremities.   PSYCH: She was alert and oriented and cooperative.   NEUROLOGIC: Exam was essentially normal.   ASSESSMENT AND PLAN: I have independently reviewed the patient's CT scan. The CT scan shows a right upper lobe mass that was not present on the CT scan from 2 years ago. I have discussed her care with Dr. Wallene Huh and Dr. Patricia Pesa. I agree with the follow-up PET scan. I explained to the patient that given her limited functional status that it is unlikely she would be a surgical candidate. She states that she is unable to walk more than 5 to 6 feet before she is out of breath.        Once she is discharged from the hospital, I would be happy to follow her up if need be. I gave the patient my business card and told her to contact me if she has any needs.  ____________________________ Lew Dawes Genevive Bi, MD teo:sb D: 04/11/2012 13:53:32 ET T: 04/11/2012 14:06:07 ET JOB#: [id number]  cc: Christia Reading E. Genevive Bi, MD, <Dictator> Louis Matte MD ELECTRONICALLY SIGNED  04/12/2012 12:25 

## 2014-07-20 NOTE — Discharge Summary (Signed)
ADDENDUM PATIENT NAME:  Mckenzie Mclaughlin, Mckenzie Mclaughlin MR#:  [id number] DATE OF BIRTH:  Apr 04, 1940  DATE OF ADMISSION:  05/01/2012 DATE OF DISCHARGE: 05/16/2012   There has been no change in the patient's condition or adequacy for transfer.   PLAN: Discharge to rehab today.     ____________________________ Consuela Mimes, MD wfm:aw D: 05/16/2012 08:36:07 ET T: 05/16/2012 12:47:23 ET JOB#: [id number]  cc: Consuela Mimes, MD, <Dictator> Consuela Mimes MD ELECTRONICALLY SIGNED 05/18/2012 7:44

## 2014-07-20 NOTE — Discharge Summary (Signed)
PATIENT NAME:  Mckenzie Mclaughlin, Mckenzie Mclaughlin MR#:  [id number] DATE OF BIRTH:  12-10-1940  DATE OF ADMISSION:  05/01/2012 DATE OF DISCHARGE:  05/13/2012  BRIEF HISTORY: [name] is a 74 year old woman with newly discovered lesion in the right upper lobe. She was set up for a percutaneous biopsy, which was performed in [place]. She suffered a post-biopsy pneumothorax and presented to the Emergency Room on the 2nd for further evaluation. A chest tube was placed and she had partial reexpansion. She continued to have intermittent problems with air leak and was seen by our thoracic surgeon. She was taken to surgery on the 6th of February where she underwent preoperative bronchoscopy and a right thoracoscopy and pleurodesis. She continued to have a small air leak and pneumothorax, which resolved over the next several days. She does not have complete reexpansion of her lung as yet, but is stable on gravity drainage. She is discharged to rehab today with Heimlich valve in place on gravity drainage.   DISCHARGE MEDICATIONS: Include Toprol 25 mg p.o. q. day, Advair Diskus 250/50 b.i.d., oxygen 2 liters as needed p.r.n., nitroglycerin 0.4 mg every 5 minutes as needed x 3, simvastatin 10 mg p.o. at bedtime, alprazolam 0.5 mg p.o. t.i.d., vitamin B 1 mL injectable once a month, Percocet 5/325 mg every 6 hours p.r.n. and Spiriva 5 mcg/2.5 mg inhalation solution once a day.   FINAL DISCHARGE DIAGNOSIS: Inflammatory change right lung and iatrogenic pneumothorax.   SURGERY: Tube thoracostomy, thoracoscopy and talc pleurodesis. ____________________________ Micheline Maze, MD rle:sb D: 05/13/2012 12:25:46 ET    T: 05/13/2012 12:37:54 ET        JOB#: [id number] cc: Rodena Goldmann III, MD, <Dictator> Timothy E. Genevive Bi, MD Elveria Rising. Damita Dunnings, MD Rodena Goldmann MD ELECTRONICALLY SIGNED 05/20/2012 17:19

## 2014-07-20 NOTE — Consult Note (Signed)
PATIENT NAME:  Mckenzie Mclaughlin, ROGOFF MR#:  [id number] DATE OF BIRTH:  Aug 25, 1940  DATE OF CONSULTATION:  05/02/2012  REFERRING PHYSICIAN:      Sherri Rad, MD CONSULTING PHYSICIAN:  Lew Dawes. Ezelle Surprenant, MD  REASON FOR CONSULTATION: Right upper lobe mass with persistent air leak.   HISTORY OF PRESENT ILLNESS:  I have personally seen and examined this patient  She is a patient of mine that I had seen in an outpatient setting with a recently diagnosed right upper lobe mass. She is a heavy smoker in the past and this was felt to represent a malignancy. The patient underwent a CT-guided needle biopsy several days ago. She did well after that but several days later presented to our Emergency Department with complaints of shortness of breath and a chest x-ray showing a pneumothorax. This was confirmed on a CT scan and was treated with a chest tube into the right hemithorax. The chest tube resulted in prompt re-expansion of the lung; however, there has been a moderate-sized air leak since.   PHYSICAL EXAMINATION:  GENERAL: She is a well-developed, well-nourished woman in no distress.  HEART: Her heart is regular.  LUNGS:  Clear and equal bilaterally. There are mechanical breath sounds on the right where the chest tube is. There is intermittent air leak from the chest tube. There is no subcutaneous emphysema.   At the present time, I think that we should continue to keep her chest tube to suction. I will attempt to get the pathology as this may influence while we ultimately due for management of her right upper lobe mass as well as her air leak. In the interim, we should continue the suction at the present levels and repeat her chest x-ray tomorrow morning.   Thank you very much for allowing me to participate in her care.     ____________________________ Lew Dawes. Genevive Bi, MD teo:ct D: 05/02/2012 16:52:44 ET T: 05/03/2012 07:15:07 ET JOB#: [id number]  cc: Christia Reading E. Genevive Bi, MD, <Dictator> Louis Matte  MD ELECTRONICALLY SIGNED 05/10/2012 15:54

## 2014-07-20 NOTE — Consult Note (Signed)
Brief Consult Note: Diagnosis: 74 yo female with history of cad s/p pci now admitted with cough and chest pain with features both typical and atypical of chest pain.   Patient was seen by consultant.   Recommend further assessment or treatment.   Comments: 74 yo patient with history of cad s/p pci now admitted with chest pain. Has ruled out for an mi. EKG is unremarkable. Pain free at present. Note a mass on lung which is being evaluated by Gen Surgery. Given history of cad and type of pain and possible surgical intervention in the future, will proceed with funct8ional study in am. Further recs after funcitonal study.  Electronic Signatures: Teodoro Spray (MD)  (Signed 13-Jan-14 15:40)  Authored: Brief Consult Note   Last Updated: 13-Jan-14 15:40 by Teodoro Spray (MD)

## 2014-07-20 NOTE — Consult Note (Signed)
Reason for Visit: This 74 year old Female patient presents to the clinic for initial evaluation of  lung cancer .   Referred by Dr. Faith Rogue.  Diagnosis:   Chief Complaint/Diagnosis   74 year old female with probable stage I (T1, N0, M0) non-small cell lung cancer of the right lung   Imaging Report cT scan and PET CT scan of reviewed    Referral Report clinical notes reviewed    Planned Treatment Regimen possible stereotacticradiosurgery    HPI   patient is a pleasant 73 year old female chronic oxygen therapy secondary to COPD who presented to the ER with substernal chest pain. She's also lost several pounds over the past few months which was unintentional. Cardiac examination was unremarkable although on CT scan she was noted to have a right upper lobe small mass. She was seen by surgical oncology not thought to be a surgical candidate based on her comorbidities and poor respiratory function. She has not had a biopsy at this time. She is seen today for evaluation for possible radiation therapy. She is having some dysphasia at this time takes Xanax for that which tends to relaxer. She is having no cough hemoptysis or chest tightness at this time.patient does have a history of her artery disease with a stent placed in the right coronary artery.  Past Hx:    COPD:    Anemia:    Back Pain, Chronic:    Anxiety:    Hypercholesterolemia:    HTN:    Emphysema:    Back Surgery:    hysterectomy:    Stent - Cardiac:   Past, Family and Social History:   Past Medical History positive    Cardiovascular coronary artery disease; hyperlipidemia; hypertension    Respiratory COPD    Neurological/Psychiatric anxiety    Past Surgical History bilateral mastectomies for benign disease    Past Medical History Comments chronic anemia, chronic back pain    Family History positive    Family History Comments family history of hypertension, coronary disease and CVA. Father and brother both  deceased from lung cancer.    Social History positive    Social History Comments greater than 50 pack year smoking history continues to still smokes a half a pack a day no EKG abuse history    Additional Past Medical and Surgical History accompanied by husband today   Allergies:   Amoxicillin: SOB  Ibuprofen: Tachycardia  Home Meds:  Home Medications: Medication Instructions Status  metoprolol tartrate 25 mg oral tablet 1 tab(s) orally once a day Active  aspirin 325 mg oral enteric coated tablet 1  orally once a day  Active  alprazolam 0.5 mg oral tablet 1 each orally once a day Active  Zocor 10 mg oral tablet 1  orally once a day  Active  B12    every two months Active  Spiriva 18 mcg inhalation capsule 1 each inhaled once a day Active  docusate sodium 100 mg oral capsule 1 cap(s) orally 2 times a day Active  Advair Diskus 250 mcg-50 mcg inhalation powder 1 puff(s) inhaled 2 times a day Active  ProAir HFA 90 mcg/inh 2 puff(s) inhaled 4 times a day Active  Oxygen 2 liter(s) inhaled as needed for shortness of breath Active   Review of Systems:   General negative    Performance Status (ECOG) 0    Skin negative    Breast see HPI    Ophthalmologic negative    ENMT negative    Respiratory and Thorax see  HPI    Cardiovascular see HPI    Gastrointestinal negative    Genitourinary negative    Musculoskeletal negative    Neurological negative    Psychiatric negative    Hematology/Lymphatics see HPI    Endocrine negative    Allergic/Immunologic negative    Review of Systems   aside from the occasional dysphasia recent history of substernal chest pain according to the nurse's notesPatient denies any weight loss, fatigue, weakness, fever, chills or night sweats. Patient denies any loss of vision, blurred vision. Patient denies any ringing  of the ears or hearing loss. No irregular heartbeat. Patient denies heart murmur or history of fainting. Patient denies any chest  pain or pain radiating to her upper extremities. Patient denies any shortness of breath, difficulty breathing at night, cough or hemoptysis. Patient denies any swelling in the lower legs. Patient denies any nausea vomiting, vomiting of blood, or coffee ground material in the vomitus. Patient denies any stomach pain. Patient states has had normal bowel movements no significant constipation or diarrhea. Patient denies any dysuria, hematuria or significant nocturia. Patient denies any problems walking, swelling in the joints or loss of balance. Patient denies any skin changes, loss of hair or loss of weight. Patient denies any excessive worrying or anxiety or significant depression. Patient denies any problems with insomnia. Patient denies excessive thirst, polyuria, polydipsia. Patient denies any swollen glands, patient denies easy bruising or easy bleeding. Patient denies any recent infections, allergies or URI. Patient "s visual fields have not changed significantly in recent time.  Nursing Notes:  Nursing Vital Signs and Chemo Nursing Nursing Notes: *CC Vital Signs Flowsheet:   17-Jan-14 09:00   Temp Temperature 95.7   Pulse Pulse 86   Respirations Respirations 20   DBP DBP 70   Pain Scale (0-10)  2   Current Weight (kg) (kg) 62.2   Height (cm) centimeters 169.9   BSA (m2) 1.7   Physical Exam:  General/Skin/HEENT:   General normal    Skin normal    Eyes normal    ENMT normal    Head and Neck normal    Additional PE well-developed thin female in NAD. No cervical or supraclavicular adenopathy is identified lungs are clear to A&P she has some distant breath sounds bilaterally. Cardiac examination shows regular rate and rhythm.   Breasts/Resp/CV/GI/GU:   Respiratory and Thorax normal    Cardiovascular normal    Gastrointestinal normal    Genitourinary normal   MS/Neuro/Psych/Lymph:   Musculoskeletal normal    Neurological normal    Lymphatics normal   Assessment and  Plan:  Impression:   probable stage I non-small cell lung cancer and 74 year old female with multiple comorbidities  Plan:   at this time I am sending the patient for fine-needle aspirate CT-guided in [facility] by the pulmonologist there. Should this be a non-small cell lung cancer would plan on performing stereotactic radiosurgery to the lesion. Would plan on delivering 5000 cGy over 5 fractions. Risks and benefits of treatment including possible pneumonitis, fatigue, skin reaction, and slight radiation esophagitis were all reviewed in detail with the patient. I have set her up for a followup appointment shortly after her biopsy.patient and her husband seem to comprehend my treatment plan well.   I would like to take this opportunity to thank you for allowing me to continue to participate in this patient's care.    CC Referral:   cc: Dr. Damita Dunnings   Electronic Signatures: Armstead Peaks (MD)  (Signed 17-Jan-14  13:25)  Authored: HPI, Diagnosis, Past Hx, PFSH, Allergies, Home Meds, ROS, Nursing Notes, Physical Exam, Encounter Assessment and Plan, CC Referring Physician   Last Updated: 17-Jan-14 13:25 by Armstead Peaks (MD)

## 2014-07-20 NOTE — Op Note (Signed)
PATIENT NAME:  Mckenzie Mclaughlin, Mckenzie Mclaughlin MR#:  [id number] DATE OF BIRTH:  [birth year]  DATE OF PROCEDURE:  05/05/2012  PREOPERATIVE DIAGNOSIS: Persistent air leak following right-sided pneumothorax after lung biopsy.   POSTOPERATIVE DIAGNOSIS: Persistent air leak following right-sided pneumothorax after lung biopsy.   OPERATION PERFORMED: 1. Preoperative bronchoscopy to assess endobronchial anatomy.  2. Right thoracoscopy with lysis of adhesions and subsequent talc pleurodesis.   SURGEON: Emelin Dascenzo E. Genevive Bi, MD   ASSISTANT: Sherri Rad, MD  INDICATION FOR PROCEDURE: [name] is a 74 year old woman with a long-standing history of tobacco use with COPD. She recently had a lung biopsy performed which resulted in a pneumothorax requiring chest tube insertion. After several days the air leak had persisted and it was felt necessary to intervene. The patient was apprised of the indications and risks of right thoracoscopy with talc pleurodesis. The indications and risks were explained to the patient and she gave her informed consent.   DESCRIPTION OF PROCEDURE: The patient was brought to the operating suite and placed in the supine position. General endotracheal anesthesia was given with a double-lumen tube. Preoperative bronchoscopy was carried out. There were very thick mucoid secretions bilaterally. These were suctioned. There were no tumors or purulent secretions identified. The patient was then turned for a right thoracoscopy. All pressure points were carefully padded. The right lung was deflated and then the chest tube was removed. The patient was then prepped and draped in the usual sterile fashion. We began by making an incision at the posterior axillary line, at approximately the seventh intercostal space. The incision was deepened down through the muscles until the pleural space was entered. We could see that there were some adhesions anteriorly and superiorly and these were lysed with electrocautery and Endo  scissors. There was no evidence of pleural disease. There was extensive emphysematous changes throughout. We could not appreciate any tumor within the upper lobe. Once the adhesions were removed, the lung was freed up and chest was irrigated. Hemostasis was complete. 5 gram of sterile talc was then insufflated under direct visualization. The chest was drained with a 28 French chest tube inserted to the apex and brought out through our most inferior port. Two other ports had been created which were simple stab wounds to accommodate instruments. These wounds were closed with interrupted Vicryl and nylon. The chest tube site was secured with silk. The larger port site was closed with running layers of Vicryl and then nylon on the skin. After all the dressings were completely and sterilely covered, we then secured the old chest tube site. This was closed with one single loosely tied nylon suture. Dressings were then applied on this area as well. The patient was extubated and taken to the recovery room in stable condition. ____________________________ Lew Dawes Genevive Bi, MD teo:sb D: 05/05/2012 18:37:23 ET T: 05/06/2012 07:28:04 ET JOB#: [id number]  cc: Christia Reading E. Genevive Bi, MD, <Dictator> Louis Matte MD ELECTRONICALLY SIGNED 05/10/2012 15:54

## 2014-07-20 NOTE — Consult Note (Signed)
PATIENT NAME:  Mckenzie Mclaughlin, Mckenzie Mclaughlin MR#:  [id number] DATE OF BIRTH:  1940/07/14  DATE OF CONSULTATION:  05/01/2012  REFERRING PHYSICIAN:  Rodena Goldmann, MD CONSULTING PHYSICIAN:  Vivien Presto, MD  REASON FOR CONSULTATION:  Medical management.   HISTORY OF PRESENT ILLNESS:  The patient is a pleasant 74 year old Caucasian female with history of COPD and long-term tobacco abuse, chronic back pain, hypertension, anxiety, depression and CAD status post PTCA and stent placement who was recently hospitalized in the middle of January. At that time, the patient came in with chest pain and shortness of breath and on workup was found to have a right-sided lung mass. The patient underwent a percutaneous biopsy at [facility] on Friday. The patient presented to the hospital after experiencing neck pain and some shortness of breath. The patient was found to have a right-sided pneumothorax and currently has a chest tube. The patient has been admitted to surgery and medicine was consulted for help with medical management.   The patient has been receiving some pain medicines including currently Dilaudid and states she has some dizziness and nausea and has been dry heaving after Dilaudid. The patient also has some neck pain that she describes as more of a spasm. Her blood pressure is also elevated. She denies any fevers or chills.   PAST MEDICAL HISTORY:  COPD and tobacco abuse in the past, but quit tobacco in January, anemia of chronic disease, chronic low back pain, benign hypertension, history of CAD status post stenting in the right coronary artery, hyperlipidemia, anxiety, depression, history of PVC, vitamin B12 deficiency, status post hysterectomy, status post back surgery, status post bilateral mastectomy due to fibrocystic breast disease, history of recently discovered a spiculated right upper lobe mass status post biopsy on Friday.   OUTPATIENT MEDICATIONS:  Advair 250/50 mcg inhaled 1 puff 2 times a day,  alprazolam 0.5 mg 1 tablet 2 times a day, aspirin 325 mg daily, vitamin B12 at 1000 mcg intramuscularly once a month, metoprolol 25 mg once a day, nitroglycerin sublingual p.r.n., oxygen 2 liters p.r.n. for shortness of breath, ProAir 90 mcg 2 puffs inhaled 4 times a day, simvastatin 10 mg daily, Spiriva 18 mcg daily.   ALLERGIES:  IBUPROFEN.   SOCIAL HISTORY:  Quit tobacco in January. No alcohol or drug use.   FAMILY HISTORY:  For hypertension, CAD and stroke.   REVIEW OF SYSTEMS: CONSTITUTIONAL: Denies fever or weight changes.  ENT: No tinnitus or hearing loss. No bleeding.  RESPIRATORY: Has COPD. He denies having any shortness of breath or wheezing.  CARDIOVASCULAR: No chest pain or palpitations. The patient does have some pain at the site of the chest tube currently.  GASTROINTESTINAL: Positive for some nausea and vomiting a small quantity, but mostly dry heaving currently. No abdominal pain or diarrhea.  GENITOURINARY: Denies dysuria, hematuria.  HEMATOLOGIC/LYMPHATICS: Has anemia of chronic disease.  MUSCULOSKELETAL: Has chronic back pain.  NEUROLOGIC: Denies weakness, numbness, stroke.  PSYCHIATRIC: Has anxiety and depression.   PHYSICAL EXAMINATION: VITAL SIGNS: Temperature 98.1, pulse rate 86, respiratory rate 18, blood pressure 173/81, oxygen saturation 93% on 3 liters.  GENERAL: The patient is an elderly Caucasian female lying in bed in mild distress.  HEENT: Normocephalic, atraumatic. Pupils are equal and reactive. Extraocular muscles intact. Anicteric sclerae. Moist mucous membranes.  NECK: Supple. No thyroid tenderness. No cervical lymphadenopathy.  CARDIOVASCULAR: S1, S2 regular rate and rhythm. No murmurs, rubs or gallops.  LUNGS: Coarse breath sounds on the right. Mild scattered wheezing. Chest  tube intact in the right side of the chest posterolaterally on the right side. Good air entry.  ABDOMEN: Soft, nontender, nondistended. Positive bowel sounds in all quadrants.   EXTREMITIES: No significant lower extremity edema.  SKIN: No obvious rashes.  NEUROLOGIC: Cranial nerves II through XII grossly intact. Strength is 5 out of 5 in all extremities. Sensation is intact to light touch.  PSYCHIATRIC: Awake, alert, oriented x 3. Cooperative.   DIAGNOSTIC DATA:  Glucose 95, BUN 14, creatinine 0.61, sodium 140, potassium 4.6, chloride 106. LFTs: AST 44, otherwise within normal limits. Troponin is negative x 1. WBC is 10.6, platelets 184, hemoglobin 14.9. INR is 2.9. CT of the neck and chest with contrast done earlier in the morning shows no cervical lymphadenopathy. No nasopharyngeal or oropharyngeal mass. No focal fluid collection. On the CT of the chest, there is moderate right pneumothorax, a spiculated 1.7 x 0.9 cm partially cavitary right upper lobe pulmonary nodule concerning for malignancy. X-ray of the chest, initial x-ray, shows mild right pneumothorax, a spiculated right apical pulmonary nodule again noted. Repeat x-ray of the chest showing a right-sided chest tube with small persistent pneumothorax.   ASSESSMENT AND PLAN:  We have a pleasant 74 year old female with history of chronic obstructive pulmonary disease, hypertension, coronary artery disease status post stenting in the past with a recent hospitalization and discovery of a right upper lobe mass status post biopsy on Friday who presents with neck pain and found with right-sided pneumothorax. The patient was admitted to surgery and medicine was consulted for medical management. The patient currently has a chest tube in the right lobe.  1.  Hypertension. The patient has uncontrolled hypertension and I think pain plays a role at this point. The patient is on metoprolol 25 mg daily. We would bump it up to 50 mg and change it to b.i.d. and add hydralazine p.r.n.  2.  Chronic obstructive pulmonary disease, this is stable. I would continue inhalers as you are doing. There is no acute chronic obstructive pulmonary  disease exacerbation. There is a little wheezing on the right side likely in the setting of the chest tube.  3.  Nausea and vomiting. This is likely secondary to intolerance to Dilaudid. I would add Phenergan. The patient is on some intravenous fluids. We would hold Dilaudid at this point. I would see if Percocet also causes similar symptoms. The patient states that the Zofran is not helping much.  4.  Hyperlipidemia. I would continue the statin.  5.  Coronary artery disease. We will continue aspirin, statin and beta-blocker. The patient has no chest pain at this point and has a negative troponin.  6.  Anxiety. I would continue the alprazolam.  7.  Neck pain. This is more likely a muscle spasm. We will start p.r.n. Flexeril and see how she does with that.  8.  Chest tube. It is being managed by surgery. Repeat x-ray shows a small persistent pneumothorax. The patient has some pain at the site of the chest tube. We will figure out how to best deal with her pain issue given her intolerance to Dilaudid and some intolerance to morphine prior. This will be more of a trial and error. THE PATIENT IS ALLERGIC TO IBUPROFEN, HOWEVER.   TOTAL TIME SPENT:  45 minutes.    ____________________________ Vivien Presto, MD sa:si D: 05/01/2012 13:06:00 ET T: 05/01/2012 22:07:29 ET JOB#: [id number]  cc: Vivien Presto, MD, <Dictator> Elveria Rising. Damita Dunnings, MD Vivien Presto MD ELECTRONICALLY SIGNED 06/06/2012 13:07

## 2014-07-20 NOTE — Telephone Encounter (Signed)
Alprazolam called to [place].

## 2014-07-20 NOTE — Discharge Summary (Signed)
PATIENT NAME:  Mckenzie Mclaughlin, Mckenzie Mclaughlin MR#:  [id number] DATE OF BIRTH:  [birth year]  DATE OF ADMISSION:  04/10/2012 DATE OF DISCHARGE:  04/12/2012  PRIMARY CARE PHYSICIAN: Elveria Rising. Damita Dunnings, MD  PULMONOLOGIST: Freda Munro Raul Del, MD  CARDIOLOGIST: Isaias Cowman, MD  REASON FOR ADMISSION: The patient was admitted with chest pain.   HISTORY OF PRESENT ILLNESS: The patient is a 74 year old female with known coronary artery disease, status post PTCA of the right coronary artery with stent placement, also COPD and tobacco abuse. She presented with chest pressure. She was admitted with chest pain worrisome for unstable angina.   LABORATORY, DIAGNOSTIC AND RADIOLOGICAL DATA: During the hospital course included an EKG that showed sinus rhythm, premature ventricular complex, 87 beats per minute. Troponin was negative. White blood cell count 7.3, hemoglobin and hematocrit 15.1 and 44.5, platelet count 200. Glucose 94, BUN 11, creatinine 0.62, sodium 139, potassium 3.8, chloride 104, CO2 of 31, calcium 9.3. Liver function tests normal. Chest x-ray showed no evidence of pneumonia. CT scan of the chest showed a new 1.5 cm diameter spiculated soft tissue density and mass in the right upper lobe worrisome for malignancy, COPD, previous granulomatous infection. Next troponins were negative. The patient had a PET scan which showed a hypermetabolic spiculated nodule in the right upper lobe; malignancy would be a differential concern; no evidence of metastatic disease. The patient was seen in consultation by Dr. Genevive Bi, cardiothoracic surgery.   HOSPITAL COURSE PER PROBLEM LIST:  1.  For the patient's chest pain, history of coronary artery disease: Likely gastroesophageal reflux disease. The patient was given omeprazole. Her cardiac enzymes were negative. This was not a myocardial infarction.  2.  For the patient's spiculated nodule, it is PET positive. She has a history of smoking. This is concerning for cancer. Need to  follow up with Dr. Genevive Bi, cardiothoracic surgery, with best way to proceed, possibly for removal of this nodule. The patient will need followup pulmonary function tests with Dr. Raul Del and a stress test with Dr. Saralyn Pilar.  3.  COPD: No evidence of exacerbation. The patient was kept on her usual inhalers. Respiratory status stable.  4.  Gastroesophageal reflux disease: Started on omeprazole.  5.  The patient for hyperlipidemia is on Zocor and for anxiety is on Xanax.   DISCHARGE INSTRUCTIONS: The patient was explained that she must follow up as outpatient, as this nodule is concerning for cancer. I did recommend to her to stop smoking. Smoking cessation counseling done 3 minutes by me. Best thing she could do preoperatively is not smoke for 6 weeks. The patient was given a nicotine patch.  TIME SPENT ON DISCHARGE: 35 minutes.    ____________________________ Tana Conch. Leslye Peer, MD rjw:jm D: 04/12/2012 17:07:56 ET T: 04/12/2012 21:41:37 ET JOB#: [id number]  cc: Tana Conch. Leslye Peer, MD, <Dictator> Elveria Rising. Damita Dunnings, MD Herbon E. Raul Del, MD Isaias Cowman, MD Lew Dawes. Genevive Bi, Russell MD ELECTRONICALLY SIGNED 04/14/2012 20:06

## 2014-07-20 NOTE — Op Note (Signed)
PATIENT NAME:  Mckenzie Mclaughlin, Mckenzie Mclaughlin MR#:  [id number] DATE OF BIRTH:  May 18, 1940  DATE OF PROCEDURE:  05/01/2012  PREOPERATIVE DIAGNOSIS:  Iatrogenic pneumothorax.   POSTOPERATIVE DIAGNOSIS:  Iatrogenic pneumothorax.   OPERATION:  Right tube thoracostomy.  ANESTHESIA:  Local with sedation.   SURGEON:  Rodena Goldmann, MD   OPERATIVE PROCEDURE:  With the patient in the decubitus position and after the induction of appropriate intravenous sedation, the patient's right chest was prepped with Betadine and draped with sterile towels. Then 0.5% Marcaine was injected in the right chest anterior axillary line. The subcutaneous space, interpleural space and intercostal spaces were injected. A small incision was made transversely and a 20-French chest tube inserted into the midchest area. The tube was secured with 3-0 silk, dressed sterilely and attached to a Pleur-Evac. Chest x-ray is pending.   ____________________________ Micheline Maze, MD rle:si D: 05/01/2012 07:07:49 ET T: 05/01/2012 22:42:16 ET JOB#: [id number]  cc: Rodena Goldmann III, MD, <Dictator> Rodena Goldmann MD ELECTRONICALLY SIGNED 05/11/2012 18:05

## 2014-07-21 NOTE — H&P (Signed)
PATIENT NAME:  Mckenzie Mclaughlin, Mckenzie Mclaughlin MR#:  [id number] DATE OF BIRTH:  1940/08/17  DATE OF ADMISSION:  05/26/2013  REFERRING EMERGENCY ROOM PHYSICIAN: Dr. Winfred Leeds.   CHIEF COMPLAINT: Shortness of breath.   HISTORY OF PRESENTING ILLNESS: A 74 year old female with past history of COPD, tobacco abuse in the past, anemia of chronic disease, chronic low back pain, hypertension, hyperlipidemia, coronary artery disease, status post stent, anxiety, depression, vitamin B12 deficiency and mastectomy in November 2014, who was recently admitted to [facility] for flulike symptoms 10 days ago, and after 5 days of hospital stay, discharged last week. She felt very weak after the discharge home, and she was not very mobile moving around at home after discharge. Now for the last 2 days, she complains that she has been feeling again worsening of her shortness of breath, but this time it does not quite feel the same as her COPD,  wheezing, shortness of breath. She feels short of breath even with minimal exertion, going to the bathroom, and feels tired more. She has some cough also and some white-colored sputum production. Denies any fever or chills. Denies any chest pain. At home, at her baseline, she was using 2 liters oxygen mostly at night, but now with the shortness of breath she has to use it in the daytime also sometimes. She has oxygen saturation machine at home and she checked. Her saturation dropped off to 84% at home in night. She was given steroid and nebulizer treatment in the ER, but still continued feeling somewhat short of breath, so hospitalist service was contacted for further management of this issue.   REVIEW OF SYSTEMS:   CONSTITUTIONAL: Negative for fever. Positive for fatigue and some weakness. No pain or weight loss or weight gain. EYES: No blurring, double vision, discharge or redness.  ENT: No tinnitus, ear pain or hearing loss.  RESPIRATORY: The patient had some cough, minimal wheezing, some  sputum production. No painful respirations.  CARDIOVASCULAR: No chest pain, orthopnea, edema, arrhythmia or palpitations.  GASTROINTESTINAL: No nausea, vomiting, diarrhea, abdominal pain.  GENITOURINARY: No dysuria, hematuria or increased frequency.  ENDOCRINE: No increased sweating. No heat or cold intolerance.  SKIN: No acne, rashes or lesions.  MUSCULOSKELETAL: No pain or swelling in the joints.  NEUROLOGICAL: No numbness, weakness, vertigo.  PSYCHIATRIC: No anxiety, insomnia, bipolar disorder.   PAST MEDICAL HISTORY: 1.  Chronic obstructive pulmonary disease and tobacco abuse in the past, quit smoking more than a year ago.  2.  Chronic respiratory failure using oxygen 2 liters at night, mostly, sometimes in the daytime also.  3.  Chronic back pain.  4.  Hypertension.  5.  Coronary artery disease, status post stent in right coronary artery.  6.  Hyperlipidemia.  7.  Anxiety.  8.  Depression.  9.  History of PVCs.  10.  Vitamin B12 deficiency.  11.  Status post hysterectomy.  12.  Status post back surgery.  13.  Status post bilateral mastectomy due to fibrocystic breast disease.  14.  History of recently discovered spiculated right upper lobe mass, status post pneumothorax and chest tube placement and finally pleurodesis   ALLERGIC TO AMOXICILLIN AND IBUPROFEN.   SOCIAL HISTORY: She lives with her husband. She quit smoking more than a year ago. No alcohol. No drug use.   FAMILY HISTORY: For hypertension, coronary artery disease and stroke.   HOME MEDICATIONS: Currently still not reviewed by pharmacy technician.   PHYSICAL EXAMINATION: VITAL SIGNS: In the ER, temperature 98.6, pulse 108,  respirations 20, blood pressure 164/77, pulse ox 99 or 98 on room air.  GENERAL: The patient is fully alert and oriented to time, place and person. Does not appear in any acute distress.  HEENT: Head and neck atraumatic. Conjunctiva pink. Oral mucosa moist.  NECK: Supple. No JVD.  RESPIRATORY:  Bilateral equal air entry. Some wheezing present.  CARDIOVASCULAR: S1, S2 present, regular. No murmur.  ABDOMEN: Soft, nontender. Bowel sounds present. No organomegaly.  SKIN: No rashes.  EXTREMITIES:  Legs: No edema and no calf tenderness. Joints: No swelling or tenderness.  NEUROLOGICAL: Power 5/5. Moves all 4 limbs. No tremor or rigidity.  PSYCHIATRIC: Does not appear in any acute psychiatric illness.   IMPORTANT LABORATORY RESULTS: Glucose 94, BUN 10, creatinine 0.73, sodium 137, potassium 3.6, chloride 101, CO2 of 32. Troponin less than 0.02. WBC 12.8, hemoglobin 13, platelet count 280. MCV is 90. URINALYSIS: WBC 6 and leukocyte esterase is trace. Clear urine. Nitrite is negative. Chest x-ray, portable single view, showed no acute cardiopulmonary disease.   ASSESSMENT AND PLAN: A 74 year old female with past medical history of coronary artery disease, chronic obstructive pulmonary disease, low back pain chronically, coronary artery disease, hypertension, hyperlipidemia and recent admission to [facility] for flulike symptoms, discharged last week to home after 5 days in hospital, and after that was not feeling very strong, so not very active and mobile at home, came worsening shortness of breath for the last 2 days.  1.  Chronic obstructive pulmonary disease exacerbation. There is some wheezing, and her oxygen saturation was up to 87 or 84, so we will treat her COPD exacerbation with IV steroid, nebulizer and Levaquin.  2.  Recent hospital admission and immobilization. The patient was also not moving properly, so there might a chance of pulmonary embolism also. I would like to get a CT scan of the chest with contrast to look into that issue.  3.  Coronary artery disease history. Will continue aspirin, simvastatin and metoprolol.  4.  Anxiety. Will continue Xanax 0.25 every 8 hours as needed.  5. Hypertension. We will continue metoprolol.   CODE STATUS: FULL CODE.   The patient's healthcare  power of attorney is her brother and sister who are present in the room currently.   Total time spent on this admission is 50 minutes.    ____________________________ Ceasar Lund Anselm Jungling, MD vgv:dmm D: 05/26/2013 21:48:00 ET T: 05/26/2013 22:02:40 ET JOB#: [id number]  cc: Ceasar Lund. Anselm Jungling, MD, <Dictator> Vaughan Basta MD ELECTRONICALLY SIGNED 06/13/2013 22:41

## 2014-07-21 NOTE — Consult Note (Signed)
PATIENT NAME:  Mckenzie Mclaughlin, Mckenzie Mclaughlin MR#:  [id number] DATE OF BIRTH:  1940/12/14  DATE OF CONSULTATION:  12/05/2013  REFERRING PHYSICIAN:  Manuella Ghazi, MD  CONSULTING PHYSICIAN:  Corey Skains, MD  REASON FOR CONSULTATION: Unstable angina, hypertension, hyperlipidemia, coronary artery disease.   CHIEF COMPLAINT: "I have chest pain."   HISTORY OF PRESENT ILLNESS: This is a 74 year old female with known coronary disease status post previous PCI and stent placement of right coronary arteries back in 2000, with preventricular contractions which appear to be benign; hyperlipidemia, hypertension on appropriate medication management at this time; with a stress test in recent months showing apical perfusion defect to a moderate degree which is a high risk procedure but had some relief with chest pain with nitrates and isosorbide. The patient has had new onset substernal chest discomfort radiating into left shoulder and into her back, associated with shortness of breath and  weakness and fatigue. This has increased over last day or 2, and was seen in the Emergency Room with a troponin of 0.06, consistent with minimal subendocardial myocardial infarction and/or NSTEMI. The patient has had relief of this pain with nitroglycerin. She has had no further chest pain at this time with an EKG showing normal sinus rhythm with left ventricular hypertrophy.   REVIEW OF SYSTEMS:  Remainder is negative for vision change, ringing in the ears, hearing loss, cough, , heart burn, nausea, vomiting, diarrhea, bloody stools, stomach pain, extremity pain, leg weakness, cramping of the buttocks, known blood clots, headaches, blackouts, dizzy spells, nosebleeds, congestion, trouble swallowing, frequent urination, urination at night, muscle weakness, numbness, anxiety, depression, skin lesions, skin rashes.   PAST MEDICAL HISTORY: Hypertension, hyperlipidemia, preventricular contractions, coronary artery disease.   FAMILY HISTORY: No family  members with early onset of cardiovascular disease or hypertension.   SOCIAL HISTORY: She apparently denies alcohol or tobacco use.   ALLERGIES: As listed.   MEDICATIONS: As listed.   PHYSICAL EXAMINATION: VITAL SIGNS: Blood pressure is 122/68, bilateral; heart rate 72, upright, reclining, and regular.  GENERAL: She is a well-appearing female in no acute distress.  HEAD AND EYES AND EARS AND NOSE AND THROAT:  No icterus, thyromegaly, ulcers, hemorrhage, or xanthelasma.  CARDIOVASCULAR: Regular rate and rhythm with normal S1 and S2 without murmur, gallop, or rub. PMI is diffuse, carotid upstroke normal without bruit; jugular venous pressure is normal.  LUNGS: Have bibasilar crackles with normal respirations.  ABDOMEN: Soft, nontender, without hepatosplenomegaly or masses. Abdominal aorta is normal size without bruit.  EXTREMITIES: Show 2+ radial, femoral, dorsal pedal pulses, with no lower extremity edema, cyanosis, clubbing or ulcers.  NEUROLOGIC: She is oriented to time, place and person with normal mood and affect.    ASSESSMENT: A 74 year old female with hypertension, hyperlipidemia, preventricular contractions, coronary artery disease with an abnormal EKG, elevated troponins consistent with Non ST elevation myocardial infarction and/or subendocardial myocardial infarction with suppressive anginal equivalent and significant high risk stress test showing reversible anterior  and apical myocardial perfusion defect previously and currently on nitrates.    ____________________________ Corey Skains, MD bjk:nt D: 12/05/2013 17:52:42 ET T: 12/05/2013 19:53:06 ET JOB#: [id number]  cc: Corey Skains, MD, <Dictator> Corey Skains MD ELECTRONICALLY SIGNED 12/27/2013 15:45

## 2014-07-21 NOTE — Discharge Summary (Signed)
PATIENT NAME:  Mckenzie Mclaughlin, Mckenzie Mclaughlin MR#:  [id number] DATE OF BIRTH:  10/22/1940  DATE OF ADMISSION:  05/26/2013 DATE OF DISCHARGE:  05/31/2013  DISCHARGE DIAGNOSES: 1.  Chronic obstructive pulmonary disease exacerbation. 2.  Chronic respiratory failure. 3.  Cavitary focus at the right lung apex. 4.  Anxiety. 5.  Coronary artery disease. 6.  Hypertension. 7.  Thyroid nodule.   CONDITION: Stable.   CODE STATUS: FULL CODE.   MEDICATIONS: Please refer to the [facility] physician discharge instruction medication reconciliation list.    OXYGEN:  The patient needs home oxygen 2 liters by nasal cannula.   DIET: Low sodium diet.   ACTIVITY: As tolerated.   FOLLOWUP CARE:  Follow-up with PCP within 1 to 2 weeks. Follow up with Dr. Raul Del within 1 to 2 weeks.   REASON FOR ADMISSION: Shortness of breath.   HOSPITAL COURSE:  1.  The patient is a 74 year old Caucasian female with a history of COPD, tobacco abuse in the past, anemia of chronic disease, hypertension, who was sent to ED due to shortness of breath. The patient's saturation decreased to 84% at home. The patient was treated with steroids and nebulizer in the ED but the patient still had shortness of breath.  For detailed history and physical examination, please refer to the admission note dictated by Dr. Anselm Jungling.  On admission date, laboratory data showed electrolytes normal, BUN 10, creatinine 0.7. Troponin less than 0.02. WBC 12.8, hemoglobin 13. Chest x-ray showed no acute cardiopulmonary disease. The patient was admitted for COPD exacerbation.  The patient has been treated with oxygen by nasal cannula, Solu-Medrol nebulizer and Levaquin. The patient's symptoms have much improved. Solu-Medrol was discontinued and changed to prednisone p.o.  2.  Anxiety. The patient has severe anxiety. The patient has been treated with Xanax p.r.n. with Ativan 1 mg p.o. b.i.d. We will continue anxiety medications. The patient feels much better today. 3.   Coronary artery disease. The patient has been treated with aspirin, Zocor, and Lopressor.  4.  The patient's CAT scan of chest showed cavitary focus at the right lung apex.  The patient  needs to follow-up with PCP or Dr. Raul Del as outpatient.  5.  CAT scan also showed a thyroid nodule.  The patient's TSH is low, but T4 is normal. The patient needs to follow up with PCP as outpatient day.  6.  According to PT evaluation, the patient needs skilled nursing facility placement. The patient is clinically stable. She will be discharged to a skilled nursing facility today. I discussed the patient's discharge plan with the patient, the patient's husband, Education officer, museum and nurse.   TIME SPENT: About 38 minutes.    ____________________________ Demetrios Loll, MD qc:dp D: 05/31/2013 07:45:08 ET T: 05/31/2013 08:04:22 ET JOB#: [id number]  cc: Demetrios Loll, MD, <Dictator> Demetrios Loll MD ELECTRONICALLY SIGNED 05/31/2013 17:19

## 2014-07-21 NOTE — H&P (Signed)
PATIENT NAME:  Mckenzie, Mclaughlin MR#:  [id number] DATE OF BIRTH:  [birth year]  DATE OF ADMISSION:  12/05/2013  PRIMARY CARE PHYSICIAN: Elsie Stain, MD   CARDIOLOGY:  Isaias Cowman, MD   REQUESTING PHYSICIAN:  Harvest Dark, MD    CHIEF COMPLAINT: Chest pain.   HISTORY OF PRESENT ILLNESS: The patient is a 74 year old female with a known history of coronary artery disease, status post right coronary stent, hypertension, COPD, on 2 liters oxygen at night, mostly is being admitted for unstable angina.  The patient was eating ice cream this morning around 11:30 when she started having sudden onset of chest pain about 7/10 in severity on the left side, radiating to her left arm, jaw and right shoulder.  No associated shortness of breath or any other symptoms.  Pain did not get relieved and she decided to come to the Emergency Department.  While in the ED, she was found to have borderline elevated troponin, with a value of 0.06 and is being admitted for further evaluation and management. Her pain now is about 1/10 after her getting aspirin in the ED.   PAST SURGICAL HISTORY:  1.  Chronic respiratory failure on 2 liters oxygen, mainly at late evening and night hours.  2.  COPD with former tobacco abuse.  3.  Chronic back pain.   4.  Hypertension.  5.  Coronary artery disease status post stenting in the right coronary.  6.  Hyperlipidemia.  7.  Chronic anxiety.  8.  Depression.  9.  Premature ventricular contractions.  10. Vitamin B12 deficiency.   PAST SURGICAL HISTORY:  1.  Hysterectomy.  2.  Back surgery.  3.  Bilateral mastectomy due to fibrocystic breast disease.   ALLERGIES: AMOXICILLIN AND IBUPROFEN.   SOCIAL HISTORY: She lives with her husband, former smoker, quit about 2 years ago. No alcohol or drug use.   FAMILY HISTORY: Positive for hypertension, coronary artery disease and stroke.   MEDICATIONS AT HOME:  Yet to be updated by pharmacy technician. There are doing it  now.   REVIEW OF SYSTEMS:  CONSTITUTIONAL: No fever, fatigue, weakness.  EYES: No blurred or double vision.  ENT: No tinnitus or ear pain.  RESPIRATORY: No cough. Positive for wheezing, no hemoptysis.  CARDIOVASCULAR: Positive for chest pain. No orthopnea. No edema.  GASTROINTESTINAL: No nausea, vomiting, diarrhea. Positive for constipation.  GENITOURINARY: No dysuria or hematuria.  ENDOCRINE: No polyuria or nocturia.  HEMATOLOGY: No anemia or easy bruising.  SKIN: No rash or lesion.  MUSCULOSKELETAL: Positive for chronic back pain.  NEUROLOGIC: No tingling, numbness, weakness.  PSYCHIATRIC: No history of anxiety or depression.   PHYSICAL EXAMINATION:  VITAL SIGNS: Temperature 97.8, heart rate 91 per minute, respirations 22 per minute, blood pressure 125/63.  She is saturating 100% on room air. GENERAL:  The patient is a 74 year old female lying in the bed, comfortably without any acute distress.  EYES: Pupils equal, round, reactive to light and accommodation. No scleral icterus. Extraocular muscles intact.  HEENT: Head atraumatic, normocephalic and oropharynx clear.  NECK: Supple, no jugular distention. No thyroid mass.  LUNGS: She has diffuse wheezing throughout both lungs, occasional rhonchi in right lower lung.  CARDIOVASCULAR: S1, S2 normal. No murmurs, rubs, or gallop.  ABDOMEN: Soft, nontender, nondistended. Bowel sounds present. No organomegaly or masses.  EXTREMITIES: No pedal edema, cyanosis or clubbing.  NEUROLOGIC: Cranial nerves II through XII intact.  Muscle strength 5/5 in all extremities. Sensation intact.  PSYCHIATRIC: The patient is alert and oriented x  3.  SKIN: No obvious rash, lesion or ulcer.  MUSCULOSKELETAL: No joint effusion or tenderness.   LABORATORY DATA: Normal BMP except potassium of 5.2. CBC within normal limits except white count of 13.7. Troponin of 0.06.   Chest x-ray in the ED showed emphysema without acute cardiopulmonary disease.   EKG shows  sinus rhythm with marked sinus arrhythmias. No major ST-T changes.   IMPRESSION AND PLAN:  1.  Unstable angina with bump in troponin may require cardiac catheter.  We will start on aspirin, nitroglycerin. Consult cardiology. Monitor on telemetry.  2.  Check fasting lipid profile, non-ST elevation myocardial infarction with troponins at 0.06 with typical anginal symptoms. We will start on heparin drip, aspirin, nitroglycerin and cardiology consultation.   3.   Coronary artery disease, status post stenting. We will continue aspirin and nitroglycerin. Cardiac consult on telemetry.  4.  Chronic obstructive pulmonary disease exacerbation. She is currently wheezing.  We will add steroids and nebulizer breathing treatment along with at rest Spiriva.  5.  CODE STATUS: FULL CODE.   TOTAL TIME SPENT:  45 minutes.       ____________________________ [name]. Manuella Ghazi, MD vss:DT D: 12/05/2013 15:47:23 ET T: 12/05/2013 16:08:21 ET JOB#: [id number]  cc: Helio Lack S. Manuella Ghazi, MD, <Dictator> Elveria Rising. Damita Dunnings, MD Remer Macho MD ELECTRONICALLY SIGNED 12/11/2013 11:46

## 2014-07-21 NOTE — Discharge Summary (Signed)
PATIENT NAME:  Mckenzie Mclaughlin, Mckenzie Mclaughlin MR#:  [id number] DATE OF BIRTH:  27-Nov-1940  DATE OF ADMISSION:  12/05/2013 DATE OF DISCHARGE:  12/06/2013  ADMITTING DIAGNOSIS: Non-Q wave myocardial infarction.   DISCHARGE DIAGNOSES:  1. Non-Q-wave myocardial infarction status post cardiac catheterization on 12/06/2013 by Dr. Nehemiah Massed, 75% stenosis of right coronary artery was noted site of prior stent.  2. Hyperlipidemia with LDL of 51.  3. Chronic obstructive pulmonary disease exacerbation, mild with acute bronchitis.   4. History of chronic respiratory failure, oxygen dependent at 2 liters of oxygen through nasal cannula.  5. Chronic obstructive pulmonary disease.  6. Coronary artery disease.  7. History of anxiety.  8. History of former tobacco abuse, chronic back pain, hyperlipidemia, premature ventricular contractions, as well as B12 deficiency.   DISCHARGE CONDITION: Stable.   DISCHARGE MEDICATIONS: The patient is to resume her outpatient medications which are Symbicort 160/4.5 two puffs twice daily, Spiriva 1 inhalation once daily, metoprolol extended release 50 mg p.o. daily, ProAir HFA 2 puffs every 4-6 hours as needed, venlafaxine 37.5 mg twice daily, alprazolam 0.5 mg every 6 hours as needed, aspirin 81 mg p.o. daily, prednisone, the patient is to taper prednisone at 50 mg p.o. once on 12/07/2013, then taper by 10 mg every 2 days until stopped; isosorbide mononitrate 30 mg p.o. daily, atorvastatin 20 mg p.o. daily, albuterol ipratropium 2.5/0.5 mg in 3 mL inhalation solution 1 inhalation every 4 hours as needed, Humibid LA 600 mg p.o. twice daily, levofloxacin 750 mg p.o. once daily for 4 more days, Tussionex 5 mL every 12 hours as needed. The patient is not to take simvastatin. The patient will be discharged on Plavix 75 mg p.o. daily dose, this is a new medication.    HOME HEALTH: None.   HOME OXYGEN: With portable tank at 2 liters of oxygen through nasal cannula.   DIET: 2 gram salt, low-fat,  low-cholesterol, regular consistency.   ACTIVITY LIMITATIONS: As tolerated.    FOLLOWUP APPOINTMENT: With Dr. Damita Dunnings in 2 days after discharge, Dr. Nehemiah Massed in 1 week after discharge, also cardiac rehabilitation program will be arranged for the patient upon discharge.   CONSULTANTS: Care management, social work, Dr. Nehemiah Massed.   RADIOLOGIC STUDIES: Chest x-ray PA and lateral 12/05/2013 revealed emphysema without acute cardiopulmonary disease, hyperlucency at the right lung base associated with soft tissue defect, and removal of right breast implant was noted.   HISTORY OF PRESENT ILLNESS: The patient is a 74 year old female with past medical history significant for history of COPD, history of coronary artery disease, who presented to the hospital with complaints of chest pains. Please refer to Dr. Trena Platt admission note on 12/05/2013. On arrival to the hospital the patient was complaining of sudden onset of chest pain radiating to the left side, to left arm, jaw, as well as her right shoulder. No shortness of breath was noted. The patient's vital signs on arrival to the hospital, temperature was 97.8, pulse was 91, respiration rate was 22, blood pressure 125/63, saturation was 100% on room air. Physical exam revealed diffuse wheezing in both lungs, occasional rhonchi, otherwise unremarkable exam. The patient's laboratory data done on arrival to the hospital, BMP showed elevated potassium level of  5.2 and BUN of 23, glucose 134, otherwise BMP was unremarkable. First set of cardiac enzymes showed troponin elevation to 0.06, second set 0.60, third set 0.51, with MB fraction elevation to a highest level of 14.2.  The patient's white blood cell count was also mildly elevated to 13.7,  hemoglobin was 14.8, platelet count was 267,000. Coagulation panel was unremarkable. EKG showed sinus rhythm at 88 beats per minute, left atrial enlargement was noted as well as septal infarct, but no significant change since prior  EKG. The patient was admitted to the hospital for further evaluation. Her cardiac enzymes were cycled. She was consulted by cardiologist who felt the patient would benefit from cardiac catheterization, which was performed on 12/06/2013. Cardiac catheterization revealed proximal LAD 30% stenosis, first diagonal with 40% stenosis proximal circumflex 30% stenosis, mid RCA with 75% stenosis at the site of prior stent. The patient was recommended for medical management of her coronary artery disease by Dr. Nehemiah Massed.  Dr. Nehemiah Massed recommended Plavix as well as aspirin from minimal subendocardial myocardial infarction, also to continue beta blockers, ACE inhibitors for hypertension control, and isosorbide for anginal equivalent, also high intensity cholesterol therapy with statin therapy, and further treatment of COPD exacerbation. He also felt that the patient is okay to be discharged home from cardiac standpoint and follow up with him in 1 week after discharge. The patient was ambulated and she feels satisfactory, and  she is going to be discharged home. The patient's risk factors were also investigated. Lipid panel was performed while she was in the hospital, and LDL was found to be 51, the patient's triglycerides were 49, and HDL was 67. Since the patient did have small cardiac event during this admission it was felt the patient would benefit from simvastatin to be changed to Lipitor. For this reason Lipitor was initiated for this patient as well as Plavix. The patient is to continue her current medications as well as Plavix as well as aspirin and new statin which is Lipitor, hoping that the patient's stenosis in the right coronary artery is not going to worsen.   In regards to hyperlipidemia, as mentioned above the patient's statin was changed from simvastatin to Lipitor. The patient is to follow up with her primary care physician and have her LDL level checked as outpatient.   In regards to COPD exacerbation  which was noted during admission due to acute bronchitis, the patient was initiated on antibiotic therapy as well as steroids. She improved somewhat during her hospitalization. She is however to continue prednisone taper, as well as antibiotics for 4 more days and inhalation therapy. She is to follow up with her primary care physician for further recommendations in regards to COPD management. She was also advised  not to restart smoking and continue oxygen therapy as needed for her chronic respiratory failure.   For her chronic medical problems such as anxiety she is to continue her outpatient medications.   The patient is being discharged in stable condition with the abovementioned medications and followup. Her vital signs on the day of discharge, temperature was 97.1, pulse was 74-100, respiration rate was 18, blood pressure ranging from 915-056 systolic and 97-94 diastolic, O2 saturations were 93-97% on 2 liters of oxygen through nasal cannula at rest.   TIME SPENT: 40 minutes.    ____________________________ Theodoro Grist, MD rv:bu D: 12/06/2013 17:54:26 ET T: 12/06/2013 19:16:44 ET JOB#: [id number]  cc: Theodoro Grist, MD, <Dictator> Elveria Rising. Damita Dunnings, MD  Theodoro Grist MD ELECTRONICALLY SIGNED 12/17/2013 18:31

## 2014-07-22 NOTE — H&P (Signed)
PATIENT NAME:  Mckenzie Mclaughlin, PUNG MR#:  [id number] DATE OF BIRTH:  [birth year]  DATE OF ADMISSION:  04/21/2011  REFERRING PHYSICIAN: Dr. Lovena Le    PRIMARY CARE PHYSICIAN: Elsie Stain, MD with     PRIMARY CARDIOLOGIST: Dr. Saralyn Pilar    PRESENTING COMPLAINT: Chest pain, back pain, nausea, vomiting, abdominal pain.   HISTORY OF PRESENT ILLNESS: Mckenzie Mclaughlin is a 74 year old woman with history of coronary artery disease status post PTCA with RCA stent placement, chronic obstructive pulmonary disease and ongoing tobacco abuse, anemia, chronic low back pain, hypertension, and hyperlipidemia who represents after being evaluated on 12/29 to 03/29/2011 for chest pain rule out. Apparently the patient reports developing chest pain yesterday morning while she was at rest with radiation to her right back as well as underneath her right breast. She has development of abdominal pain, diffuse, nausea and vomiting. Reports that she took Alka-Seltzer with mild improvement but had immediate recurrence. Since being treated here in the ED, reports that she is currently chest pain free. She denies any sick contact. In review of [facility] records, it looks like she was seen by Dr. Saralyn Pilar and had an echocardiogram and an ETT sestamibi done on 04/10/2011 showing EF greater than 55% with no evidence of ischemia. The patient denies any palpitations, orthopnea, or PND. No worsening lower extremity edema. She reports occasional presyncope with lightheadedness but no syncopal episodes.   PAST MEDICAL HISTORY:  1. Known coronary artery disease status post PTCA with RCA stent.  2. Chronic obstructive pulmonary disease and ongoing tobacco abuse.  3. Anemia of chronic disease.  4. Chronic low back pain.  5. Hypertension.  6. Hyperlipidemia.  7. Depression/anxiety.  8. PVCs.  9. B12 deficiency.   PAST SURGICAL HISTORY:  1. Hysterectomy.  2. Back surgery.  3. Bilateral mastectomy due to fibrocystic disease with  silicone implants.   ALLERGIES: Ibuprofen.   MEDICATIONS:  1. Zocor 10 mg daily.  2. Spiriva daily.  3. Nitrostat sublingual as needed for chest pain.  4. B12 1000 mcg IM monthly.  5. Aspirin 325 mg daily.  6. Xanax 0.5 mg t.i.d. as needed.  7. Advair 100 b.i.d.  8. Albuterol inhaler as needed.  9. Toprol-XL 25 mg daily   SOCIAL HISTORY: She lives in [place] with her husband. She smokes about six cigarettes per day. No alcohol or drug use.   FAMILY HISTORY: Heart disease, hypertension, stroke.  REVIEW OF SYSTEMS: CONSTITUTIONAL: No fevers or chills. She endorses nausea and vomiting as per history of present illness. EYES: No glaucoma or cataracts. ENT: Reports dysphagia but has been evaluated by her PCP and EGD was negative. RESPIRATORY: No cough, wheezing, hemoptysis. Endorses shortness of breath. CARDIOVASCULAR: As per history of present illness. GI: Endorses nausea, vomiting, and abdominal pain. No diarrhea. She has constipation. No bright red blood per rectum or melena. GU: No dysuria or hematuria. ENDOCRINE: No polyuria or polydipsia. HEME: No easy bleeding. SKIN: No ulcers. MUSCULOSKELETAL: She has chronic back pain. NEUROLOGIC: No one-sided weakness or numbness. PSYCH: Denies any depression or suicidal ideation.   PHYSICAL EXAMINATION:   VITAL SIGNS: Temperature 97.7, pulse 79, respiratory rate 16, blood pressure 192/90, repeat blood pressure 114/50, sating at 99% on room air.   GENERAL: Lying in bed in no apparent distress.   HEENT: Normocephalic, atraumatic. Pupils equal, symmetric, nonicteric. Nares with nasal cannula in place. She has moist mucous membranes.   NECK: Soft and supple. No adenopathy or JVP.   CARDIOVASCULAR: Non-tachy. No murmurs, rubs, or  gallops.   LUNGS: Clear to auscultation bilaterally. No use of accessory muscles or increased respiratory effort.   ABDOMEN: Soft. Positive bowel sounds. No mass appreciated. She has tenderness on palpation diffusely.    EXTREMITIES: No edema. Dorsal pedis pulses intact.   MUSCULOSKELETAL: No joint effusion.   SKIN: No ulcers.   NEUROLOGIC: Symmetrical strength. No focal deficits.   PSYCH: She is alert and oriented. The patient is cooperative.   PERTINENT LABS AND STUDIES: CT abdomen and pelvis with contrast shows no evidence of bowel obstruction or ileus. No acute inflammatory change of the bowel. No free extraluminal fluid nor gas. No hepatobiliary abnormality or acute urinary tract abnormality. Urinalysis with specific gravity of 1.023, blood 1+, pH 5, leukocyte esterase 1+, RBC 6 per high-power field, WBC 7 per high-power field. CK 104. MB 2. Troponin less than 0.02. Glucose 97, BUN 17, creatinine 0.73, sodium 143, potassium 4.4, chloride 102, carbon dioxide 32, calcium 9.8. LFTs within normal limits. WBC 9.5, hemoglobin 14.7, hematocrit 43.8, platelet 202, MCV 93. Amylase 59. Lipase 212. EKG with sinus rate of 73. No ST elevation or depression. There is P wave inversion. Chest x-ray without infiltrate.   ASSESSMENT AND PLAN: Mckenzie Mclaughlin is a 74 year old woman with history of coronary artery disease status post RCA stent, chronic obstructive pulmonary disease, ongoing tobacco abuse, anemia, hypertension, and hyperlipidemia who represents with complaints of chest pain, nausea, vomiting, abdominal pain, and back pain. 1. Chest pain with atypical and typical features. Last admitted back in December for chest pain rule out and had follow-up with Dr. Saralyn Pilar with echo and ETT sestamibi with results as above. Dr. Saralyn Pilar recommended continued medical management at the time. Last catheterization from our records is June of 2003. Her nausea, vomiting, and abdominal pain could be separate entity with gastroenteritis. If has recurrent chest pain, consider Cardiology consultation for recommendations and diagnostic studies including catheterization. Will resume aspirin, nitroglycerin sublingual as needed, O2, Zocor, and  Toprol-XL. Send TSH, fasting lipid panel, and A1c.  2. Chronic obstructive pulmonary disease and ongoing tobacco abuse. No acute flare. Restart Spiriva, Advair, and albuterol inhaler as needed. Start nicotine patch. Encouraged smoking cessation.  3. Hypertension. Toprol-XL.  4. Depression/anxiety. Xanax as needed.  5. Prophylaxis with Lovenox, aspirin, and Protonix.   TIME SPENT: Approximately 45 minutes spent on patient care.   ____________________________ Rita Ohara, MD ap:drc D: 04/21/2011 02:34:26 ET T: 04/21/2011 07:14:56 ET JOB#: [id number]  cc: Brien Few Katalaya Beel, MD, <Dictator> Elsie Stain, MD with Alex Gardener [facility] MD ELECTRONICALLY SIGNED 05/02/2011 23:34

## 2014-07-22 NOTE — Discharge Summary (Signed)
PATIENT NAME:  Mckenzie Mclaughlin, COREA MR#:  [id number] DATE OF BIRTH:  Aug 14, 1940  DATE OF ADMISSION:  03/28/2011 DATE OF DISCHARGE:  03/29/2011  PRIMARY CARE PHYSICIAN: Dr. Phillip Heal    CARDIOLOGIST: Dr. Saralyn Pilar   DISCHARGE DIAGNOSIS: Chest pain, likely noncardiac with negative serial cardiac enzymes, could be stress related.   SECONDARY DIAGNOSES:  1. Anemia of chronic disease. 2. Chronic obstructive pulmonary disease.  3. Chronic low back pain.  4. Hypertension. 5. Hyperlipidemia.  6. Anxiety. 7. Atherosclerotic cardiovascular disease status post PTCA with stent placement.   CONSULTATION: Cardiology, Dr. Nehemiah Massed   PROCEDURES/RADIOLOGY:  1. Chest x-ray on December 29th showed no acute cardiopulmonary disease.  2. Chest x-ray on December 30th showed no acute cardiopulmonary disease.   HISTORY AND SHORT HOSPITAL COURSE: The patient is a 74 year old female with above-mentioned medical problems who was admitted for suspected unstable angina. She had a normal EKG and negative two sets of cardiac enzymes. She was evaluated by Cardiology, Dr. Nehemiah Massed, who recommended ambulation and monitoring to see if she has any changes in her symptoms. She was ambulated by nurse and did not find any chest pain. She did have minimal trouble breathing which she claims as her baseline breathing. She is feeling much better, did not have anymore chest pain, and was discharged home in stable condition.   On the date of discharge her vital signs are as follows: Temperature 98, heart rate 66 per minute, respirations 16 per minute, blood pressure 134/64 mmHg. She was saturating 93% on room air.   PERTINENT PHYSICAL EXAMINATION ON THE DATE OF DISCHARGE: CARDIOVASCULAR: S1, S2 normal. No murmur, rubs, or gallop. LUNGS: Clear to auscultation bilaterally. No wheezing, rales, rhonchi, or crepitation. ABDOMEN: Soft, benign. NEUROLOGIC: Nonfocal examination. All other physical examination remained at the baseline.     DISCHARGE MEDICATIONS:  1. Atenolol 25 mg p.o. daily.  2. ProAir 2 puffs inhaled 4 times a day. 3. Aspirin 325 mg p.o. daily.  4. Advair Diskus once a day at her home dose.  5. Spiriva once daily. 6. Alprazolam 0.5 mg p.o. b.i.d.   7. Zocor 10 mg p.o. daily. 8. Albuterol 4 times a day. 9. Nitroglycerin 0.4 mg sublingual every five minutes x3 as needed.  10. Vitamin B12 injection once a month.   DISCHARGE DIET: Low sodium.   DISCHARGE ACTIVITY: As tolerated.   DISCHARGE INSTRUCTIONS AND FOLLOW-UP:  1. The patient was instructed to follow-up with her primary physician, Dr. Phillip Heal, in 1 to 2 weeks.  2. She will need follow-up with Dr. Saralyn Pilar from [facility] tomorrow as recommended by Dr. Nehemiah Massed.  3. She has been using 2 liters oxygen by nasal cannula at her home chronically which will need to be continued.   TOTAL TIME DISCHARGING THIS PATIENT: 55 minutes. ____________________________ [name]. Manuella Ghazi, MD vss:drc D: 03/29/2011 20:25:00 ET  T: 04/02/2011 10:16:20 ET  JOB#: [id number]  cc: Dr. Linde Gillis, MD  Remer Macho MD ELECTRONICALLY SIGNED 04/05/2011 22:20

## 2014-07-22 NOTE — Discharge Summary (Signed)
PATIENT NAME:  Mckenzie Mclaughlin, Mckenzie Mclaughlin MR#:  [id number] DATE OF BIRTH:  05/23/1940  DATE OF ADMISSION:  03/28/2011 DATE OF DISCHARGE:  03/29/2011  PRIMARY CARE PHYSICIAN: Dr. Phillip Heal    CARDIOLOGIST: Dr. Saralyn Pilar   DISCHARGE DIAGNOSIS: Chest pain, likely noncardiac with negative serial cardiac enzymes, could be stress related.   SECONDARY DIAGNOSES:  1. Anemia of chronic disease. 2. Chronic obstructive pulmonary disease.  3. Chronic low back pain.  4. Hypertension. 5. Hyperlipidemia.  6. Anxiety. 7. Atherosclerotic cardiovascular disease status post PTCA with stent placement.   CONSULTATION: Cardiology, Dr. Nehemiah Massed   PROCEDURES/RADIOLOGY:  1. Chest x-ray on December 29th showed no acute cardiopulmonary disease.  2. Chest x-ray on December 30th showed no acute cardiopulmonary disease.   HISTORY AND SHORT HOSPITAL COURSE: The patient is a 74 year old female with above-mentioned medical problems who was admitted for suspected unstable angina. She had a normal EKG and negative two sets of cardiac enzymes. She was evaluated by Cardiology, Dr. Nehemiah Massed, who recommended ambulation and monitoring to see if she has any changes in her symptoms. She was ambulated by nurse and did not find any chest pain. She did have minimal trouble breathing which she claims as her baseline breathing. She is feeling much better, did not have anymore chest pain, and was discharged home in stable condition.   On the date of discharge her vital signs are as follows: Temperature 98, heart rate 66 per minute, respirations 16 per minute, blood pressure 134/64 mmHg. She was saturating 93% on room air.   PERTINENT PHYSICAL EXAMINATION ON THE DATE OF DISCHARGE: CARDIOVASCULAR: S1, S2 normal. No murmur, rubs, or gallop. LUNGS: Clear to auscultation bilaterally. No wheezing, rales, rhonchi, or crepitation. ABDOMEN: Soft, benign. NEUROLOGIC: Nonfocal examination. All other physical examination remained at the baseline.     DISCHARGE MEDICATIONS:  1. Atenolol 25 mg p.o. daily.  2. ProAir 2 puffs inhaled 4 times a day. 3. Aspirin 325 mg p.o. daily.  4. Advair Diskus once a day at her home dose.  5. Spiriva once daily. 6. Alprazolam 0.5 mg p.o. b.i.d.   7. Zocor 10 mg p.o. daily. 8. Albuterol 4 times a day. 9. Nitroglycerin 0.4 mg sublingual every five minutes x3 as needed.  10. Vitamin B12 injection once a month.   DISCHARGE DIET: Low sodium.   DISCHARGE ACTIVITY: As tolerated.   DISCHARGE INSTRUCTIONS AND FOLLOW-UP:  1. The patient was instructed to follow-up with her primary physician, Dr. Phillip Heal, in 1 to 2 weeks.  2. She will need follow-up with Dr. Saralyn Pilar from [facility] tomorrow as recommended by Dr. Nehemiah Massed.  3. She has been using 2 liters oxygen by nasal cannula at her home chronically which will need to be continued.   TOTAL TIME DISCHARGING THIS PATIENT: 55 minutes.  ____________________________ [name]. Manuella Ghazi, MD vss:drc D: 03/29/2011 20:25:31 ET T: 04/02/2011 10:16:20 ET JOB#: [id number]  cc: Kennah Hehr S. Manuella Ghazi, MD, <Dictator>, Dr. Phillip Heal, Isaias Cowman, MD

## 2014-07-22 NOTE — Discharge Summary (Signed)
PATIENT NAME:  HARMONII, KARLE MR#:  [id number] DATE OF BIRTH:  July 13, 1940  DATE OF ADMISSION:  04/21/2011 DATE OF DISCHARGE:  04/21/2011  ADMITTING DIAGNOSIS: Chest pain, back pain, nausea, vomiting, and abdominal pain.   DISCHARGE DIAGNOSES:  1. Chest pain, atypical in nature. Negative cardiac enzymes and had a recent stress test which was  negative. Felt to have atypical chest pain per cardiology. She does have a history of coronary artery disease. The patient will need to be followed up by cardiology. If she has recurrent symptoms she will likely need cardiac catheterization to rule out any obstructive processes.  2. Nausea, vomiting, and abdominal pain possibly due to gastritis. Her CT of the abdomen was negative. We will try PPIs.  3. Chronic constipation. The patient will be on stool softeners and laxatives as needed.  4. Known coronary artery disease status post percutaneous transluminal coronary angioplasty with RCA stent.  5. Chronic obstructive pulmonary disease with ongoing tobacco abuse.  6. Anemia of chronic disease.  7. Chronic back pain.  8. Hypertension.  9. Hyperlipidemia.  10. Depression/anxiety.  11. History of premature ventricular contractions.  12. History of B12 deficiency.  13. Status post hysterectomy.  14. Status post back surgery.  15. Status post bilateral mastectomies due to fibrocystic disease with silicon implants.   PERTINENT LABORATORY AND EVALUATIONS: CT of the abdomen and pelvis with contrast shows no evidence of bowel obstruction. No ileus. No acute inflammatory changes.  Urinalysis showed 1.03,  blood 1+, pH 5, leukocyte esterase 1+. However, WBCs were only 7. CPK was 104, CK-MB 2, troponin less than 0.02. Glucose 97, BUN 17, creatinine 0.73, sodium 143, potassium 4.4, chloride 102, carbon dioxide 32, calcium 9.8. LFTs were normal. WBC 9.5, hemoglobin 14.7, platelet count 202. Amylase 59, lipase 212. EKG with normal sinus rhythm. No ST-T wave changes.  Chest x-ray without acute infiltrate. The patient's troponin was less than 0.02 times two.   CONSULTANTS: None.   HOSPITAL COURSE: Please see the History and Physical done by the admitting physician. The patient is a 74 year old white female with history of coronary artery disease, chronic obstructive pulmonary disease, ongoing tobacco abuse, anemia, chronic low back pain, hypertension, and hyperlipidemia who was hospitalized here from 12/29 to 12/30 for chest pain rule-out. The patient was monitored in the hospital, had negative enzymes, was discharged home. Cardiology felt it was likely atypical chest pain. She had a stress test as an outpatient on 04/10/2011 showing ejection fraction greater than 55% with no evidence of ischemia. The patient presented back with same complaints of chest pain. Her EKG and cardiac enzymes were nonrevealing. She also had multiple other complaints. She had abdominal pain, nausea, and vomiting. She had a CT of the abdomen and pelvis, which was negative. The patient was kept on observation. Serial enzymes were done, which have remained negative. Her abdominal pain, nausea, and vomiting have improved. Chest pain is resolved. At this point I recommend a followup with cardiology and if her symptoms persist, may need to decide about possible catheterization. Also, she needs outpatient GI evaluation with endoscopy to make sure that GI causes are not contributing to her chest pain. We will place her on PPIs. At this time she is tolerating a diet and is doing well and is stable for discharge.   DISCHARGE MEDICATIONS:  1. Atenolol 25 daily.  2. ProAir 2 puffs 4 times daily as needed.  3. Aspirin 325 daily.  4. Advair Diskus one INH b.i.d.  5. Spiriva daily.  6. Alprazolam 0.4, 1 tab p.o. b.i.d.  7. Zocor 10 daily.  8. Nitrostat 0.4, one sublingual p.r.n.  9. B12 monthly.  10. Colace 100 b.i.d.  11. Senna 1 tab p.o. b.i.d. p.r.n. constipation.  12. Omeprazole 20 p.o. daily.    DIET: Low sodium.   ACTIVITY: As tolerated.   TIMEFRAME FOR FOLLOWUP: 1 to 2 weeks with Dr. Elsie Stain, her primary care physician.  Follow up with Dr. Saralyn Pilar in 2 to 4 weeks for cardiology followup. Follow up with [facility] as a new patient for possible esophagogastroduodenoscopy and further GI evaluation.   TIME SPENT: 35 minutes.   ____________________________ Lafonda Mosses Posey Pronto, MD shp:bjt D: 04/21/2011 10:52:52 ET T: 04/22/2011 10:27:42 ET JOB#: [id number]  cc: Dashawn Golda H. Posey Pronto, MD, <Dictator> Dr. Elsie Stain Alric Seton MD ELECTRONICALLY SIGNED 05/16/2011 9:56

## 2014-07-24 DIAGNOSIS — J449 Chronic obstructive pulmonary disease, unspecified: Secondary | ICD-10-CM | POA: Diagnosis not present

## 2014-07-24 DIAGNOSIS — I509 Heart failure, unspecified: Secondary | ICD-10-CM | POA: Diagnosis not present

## 2014-07-24 DIAGNOSIS — G4733 Obstructive sleep apnea (adult) (pediatric): Secondary | ICD-10-CM | POA: Diagnosis not present

## 2014-08-05 ENCOUNTER — Other Ambulatory Visit: Payer: Self-pay | Admitting: Family Medicine

## 2014-08-05 DIAGNOSIS — E785 Hyperlipidemia, unspecified: Secondary | ICD-10-CM

## 2014-08-05 DIAGNOSIS — E538 Deficiency of other specified B group vitamins: Secondary | ICD-10-CM

## 2014-08-09 ENCOUNTER — Other Ambulatory Visit (INDEPENDENT_AMBULATORY_CARE_PROVIDER_SITE_OTHER): Payer: Medicare Other

## 2014-08-09 DIAGNOSIS — E538 Deficiency of other specified B group vitamins: Secondary | ICD-10-CM | POA: Diagnosis not present

## 2014-08-09 DIAGNOSIS — E785 Hyperlipidemia, unspecified: Secondary | ICD-10-CM | POA: Diagnosis not present

## 2014-08-09 LAB — COMPREHENSIVE METABOLIC PANEL
ALT: 19 U/L (ref 0–35)
AST: 23 U/L (ref 0–37)
Albumin: 3.6 g/dL (ref 3.5–5.2)
Alkaline Phosphatase: 73 U/L (ref 39–117)
BILIRUBIN TOTAL: 0.6 mg/dL (ref 0.2–1.2)
BUN: 15 mg/dL (ref 6–23)
CO2: 34 meq/L — AB (ref 19–32)
Calcium: 10 mg/dL (ref 8.4–10.5)
Chloride: 102 mEq/L (ref 96–112)
Creatinine, Ser: 0.79 mg/dL (ref 0.40–1.20)
GFR: 76.16 mL/min (ref >60.00)
Glucose, Bld: 86 mg/dL (ref 70–99)
POTASSIUM: 4.2 meq/L (ref 3.5–5.1)
Sodium: 140 mEq/L (ref 135–145)
Total Protein: 6.6 g/dL (ref 6.0–8.3)

## 2014-08-09 LAB — VITAMIN B12: VITAMIN B 12: 189 pg/mL — AB (ref 211–911)

## 2014-08-09 LAB — LIPID PANEL
Cholesterol: 115 mg/dL (ref 0–200)
HDL: 56.1 mg/dL (ref >39.00)
LDL Cholesterol: 41 mg/dL (ref 0–99)
NonHDL: 58.9
Total CHOL/HDL Ratio: 2
Triglycerides: 89 mg/dL (ref 0.0–149.0)
VLDL: 17.8 mg/dL (ref 0.0–40.0)

## 2014-08-10 ENCOUNTER — Encounter: Payer: Self-pay | Admitting: Family Medicine

## 2014-08-10 ENCOUNTER — Ambulatory Visit (INDEPENDENT_AMBULATORY_CARE_PROVIDER_SITE_OTHER): Payer: Medicare Other | Admitting: Family Medicine

## 2014-08-10 VITALS — BP 162/86 | HR 89 | Temp 98.7°F | Wt 174.2 lb

## 2014-08-10 DIAGNOSIS — Z0000 Encounter for general adult medical examination without abnormal findings: Secondary | ICD-10-CM | POA: Diagnosis not present

## 2014-08-10 DIAGNOSIS — Z23 Encounter for immunization: Secondary | ICD-10-CM | POA: Diagnosis not present

## 2014-08-10 DIAGNOSIS — F418 Other specified anxiety disorders: Secondary | ICD-10-CM

## 2014-08-10 DIAGNOSIS — E538 Deficiency of other specified B group vitamins: Secondary | ICD-10-CM | POA: Diagnosis not present

## 2014-08-10 DIAGNOSIS — F329 Major depressive disorder, single episode, unspecified: Secondary | ICD-10-CM

## 2014-08-10 DIAGNOSIS — Z7189 Other specified counseling: Secondary | ICD-10-CM

## 2014-08-10 DIAGNOSIS — F419 Anxiety disorder, unspecified: Secondary | ICD-10-CM

## 2014-08-10 MED ORDER — CYANOCOBALAMIN 1000 MCG/ML IJ SOLN
1000.0000 ug | Freq: Once | INTRAMUSCULAR | Status: AC
  Administered 2014-08-10: 1000 ug via INTRAMUSCULAR

## 2014-08-10 MED ORDER — CYANOCOBALAMIN 1000 MCG/ML IJ SOLN: 1000.0000 ug | INTRAMUSCULAR | Status: DC

## 2014-08-10 NOTE — Progress Notes (Signed)
Pre visit review using our clinic review tool, if applicable. No additional management support is needed unless otherwise documented below in the visit note.  I have personally reviewed the Medicare Annual Wellness questionnaire and have noted 1. The patient's medical and social history 2. Their use of alcohol, tobacco or illicit drugs 3. Their current medications and supplements 4. The patient's functional ability including ADL's, fall risks, home safety risks and hearing or visual             impairment. 5. Diet and physical activities 6. Evidence for depression or mood disorders  The patients weight, height, BMI have been recorded in the chart and visual acuity is per eye clinic.  I have made referrals, counseling and provided education to the patient based review of the above and I have provided the pt with a written personalized care plan for preventive services.  Provider list updated- see scanned forms.  Routine anticipatory guidance given to patient.  See health maintenance.  Flu 2015 Shingles d/w pt.  Not indicated due to pred use.  PNA 2011 Tetanus 2007 Colonoscopy NA due to comorbid conditions, patient declined screening.   Breast cancer screening- d/w pt. Done 01/2013 DXA declined, d/w pt.  Advance directive- husband designated if patient were incapacitated.  Noted that if patient were acutely ill with the chance of recovery, then she would be okay with short term intensive treatment.  She would want to avoid long term intervention.  For example, if PNA and would need vent support of potential short duration with chance of recovery, then okay to proceed with intubation.  Cognitive function addressed- see scanned forms- and if abnormal then additional documentation follows.   Low B12.   D/w pt.  See AVS.  Will restart replacement today and recheck in a few months.    Depression.  Frustrated with her situation, but her mood isn't low enough to warrant med change per her report.   No SI/HI.   PMH and SH reviewed  Meds, vitals, and allergies reviewed.   ROS: See HPI.  Otherwise negative.    GEN: nad, alert and oriented, chronically but not acute ill appearing HEENT: mucous membranes moist NECK: supple w/o LA CV: rrr. PULM: dec in BS globally but o/w ctab, no inc wob ABD: soft, +bs EXT: no edema SKIN: no acute rash

## 2014-08-10 NOTE — Patient Instructions (Addendum)
Repeat B12 shot her monthly for now with a lab draw (B12 level) in about 4 months.  Take care. Glad to see you.  I'll await your notes from [facility].

## 2014-08-14 DIAGNOSIS — I509 Heart failure, unspecified: Secondary | ICD-10-CM | POA: Diagnosis not present

## 2014-08-14 DIAGNOSIS — G4733 Obstructive sleep apnea (adult) (pediatric): Secondary | ICD-10-CM | POA: Diagnosis not present

## 2014-08-14 DIAGNOSIS — J449 Chronic obstructive pulmonary disease, unspecified: Secondary | ICD-10-CM | POA: Diagnosis not present

## 2014-08-15 ENCOUNTER — Other Ambulatory Visit: Payer: Self-pay | Admitting: Family Medicine

## 2014-08-15 DIAGNOSIS — Z7189 Other specified counseling: Secondary | ICD-10-CM | POA: Insufficient documentation

## 2014-08-15 NOTE — Assessment & Plan Note (Addendum)
Flu 2015 Shingles d/w pt.  Not indicated due to pred use.  PNA 2011 Tetanus 2007 Colonoscopy NA due to comorbid conditions, patient declined screening.   Breast cancer screening- d/w pt. Done 01/2013 DXA declined, d/w pt.  Advance directive- husband designated if patient were incapacitated.  Noted that if patient were acutely ill with the chance of recovery, then she would be okay with short term intensive treatment.  She would want to avoid long term intervention.  For example, if PNA and would need vent support of potential short duration with chance of recovery, then okay to proceed with intubation.  Cognitive function addressed- see scanned forms- and if abnormal then additional documentation follows.

## 2014-08-15 NOTE — Assessment & Plan Note (Signed)
Restart replacement and recheck in few months.  See AVS.

## 2014-08-15 NOTE — Assessment & Plan Note (Signed)
She'll update me as needed.  Not at the point of needing med change per patient.

## 2014-08-16 ENCOUNTER — Telehealth: Payer: Self-pay | Admitting: *Deleted

## 2014-08-16 NOTE — Telephone Encounter (Signed)
Fax received from [facility] stating that patient was given Xanax 0.5 mg #90 with 3 RF on 07/19/14.  Then Rx was phoned in for #90 with 0 refills.  Information is in Dr. Josefine Class In [place].

## 2014-08-17 NOTE — Telephone Encounter (Signed)
This has been faxed back to pharmacy

## 2014-08-17 NOTE — Telephone Encounter (Signed)
The #90 with 3 rf should stand.  Please cancel the other.  Thanks.

## 2014-08-22 ENCOUNTER — Other Ambulatory Visit: Payer: Self-pay | Admitting: Specialist

## 2014-08-22 ENCOUNTER — Ambulatory Visit
Admission: RE | Admit: 2014-08-22 | Discharge: 2014-08-22 | Disposition: A | Discharge status: Home / Self Care | Payer: Medicare Other | Source: Ambulatory Visit | Attending: Specialist | Admitting: Specialist

## 2014-08-22 DIAGNOSIS — R918 Other nonspecific abnormal finding of lung field: Secondary | ICD-10-CM | POA: Diagnosis not present

## 2014-08-22 DIAGNOSIS — N200 Calculus of kidney: Secondary | ICD-10-CM | POA: Diagnosis not present

## 2014-08-22 DIAGNOSIS — J439 Emphysema, unspecified: Secondary | ICD-10-CM | POA: Diagnosis not present

## 2014-08-22 DIAGNOSIS — R911 Solitary pulmonary nodule: Secondary | ICD-10-CM

## 2014-08-22 DIAGNOSIS — Z09 Encounter for follow-up examination after completed treatment for conditions other than malignant neoplasm: Secondary | ICD-10-CM | POA: Diagnosis present

## 2014-08-22 DIAGNOSIS — I2510 Atherosclerotic heart disease of native coronary artery without angina pectoris: Secondary | ICD-10-CM | POA: Diagnosis not present

## 2014-08-22 NOTE — Telephone Encounter (Signed)
See additional info on 07/18/14 refill note.

## 2014-08-22 NOTE — Telephone Encounter (Signed)
Christy from [facility] left v/m that received denial for refill alprazolam due to call in on 07/19/14 for #90 with 3 refills; Christy said on v/m it sounded like #90 with 0 refills; Alyse Low will update with admustment refill for #90 with 2 refills. NO cb needed.

## 2014-08-23 DIAGNOSIS — G4733 Obstructive sleep apnea (adult) (pediatric): Secondary | ICD-10-CM | POA: Diagnosis not present

## 2014-08-23 DIAGNOSIS — I509 Heart failure, unspecified: Secondary | ICD-10-CM | POA: Diagnosis not present

## 2014-08-23 DIAGNOSIS — J449 Chronic obstructive pulmonary disease, unspecified: Secondary | ICD-10-CM | POA: Diagnosis not present

## 2014-08-25 IMAGING — CR DG CHEST 2V
3 series · 3 of 3 positions shown · non-contrast
Comparison: Chest CTA 07/09/2012 and earlier.

CLINICAL DATA: 72-year-old female with chest pain. Initial
encounter.

EXAM:
CHEST  2 VIEW

[w chest lat]
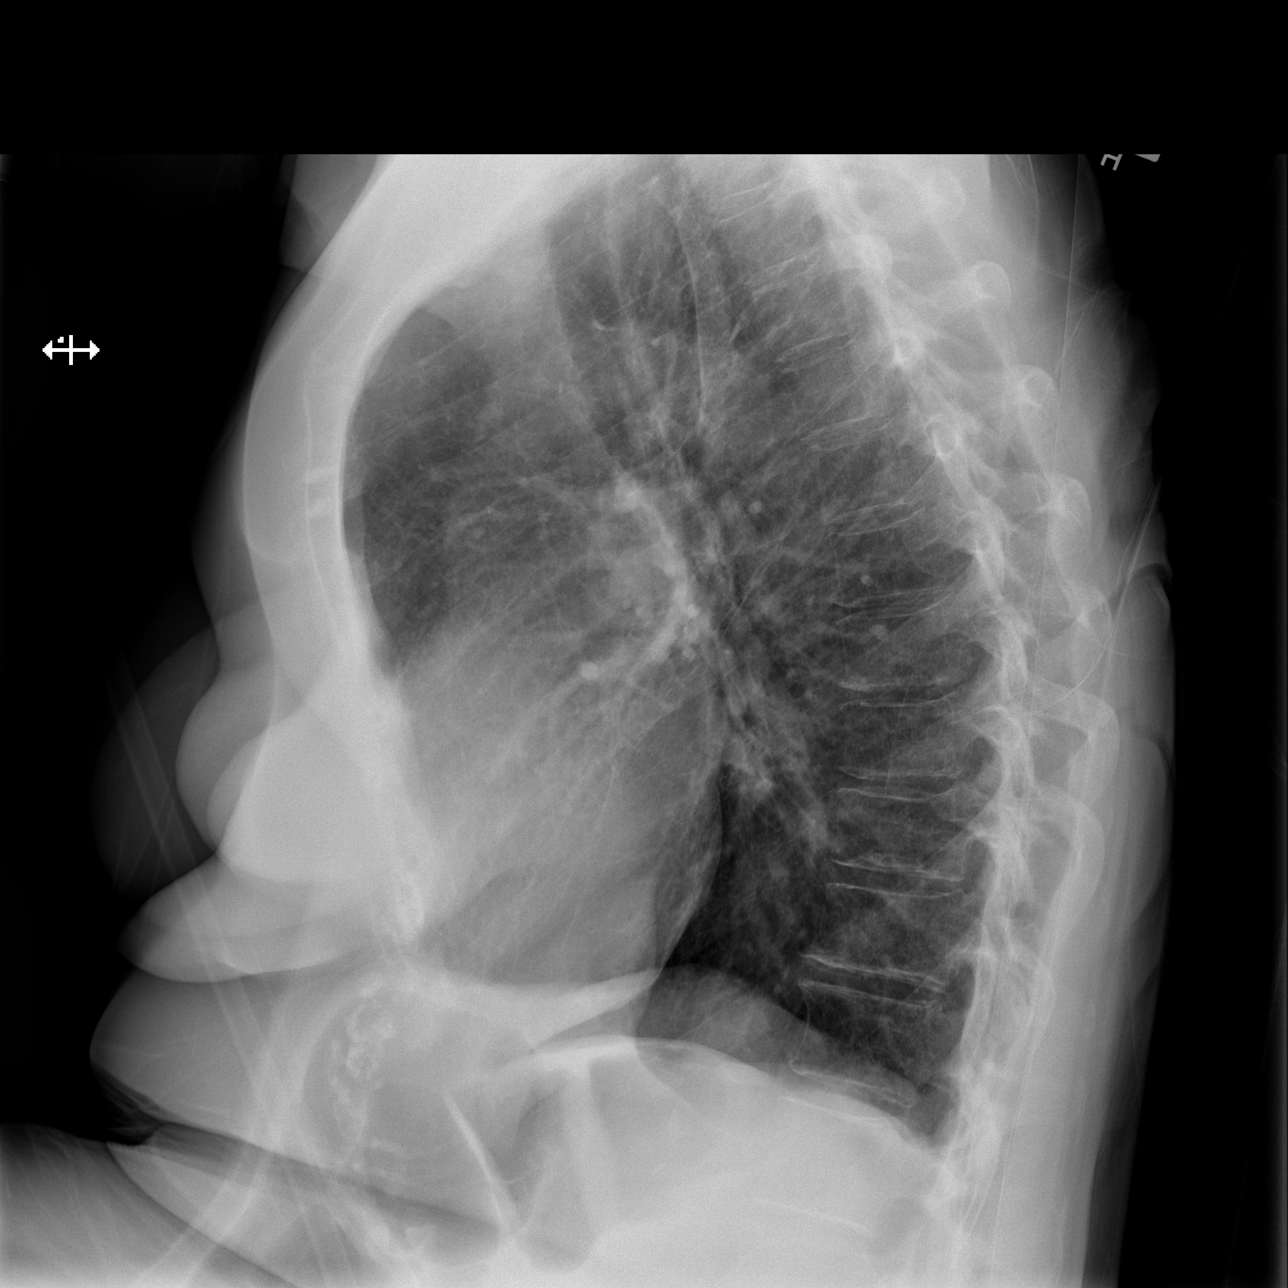

[x chest ap (1 of 2)]
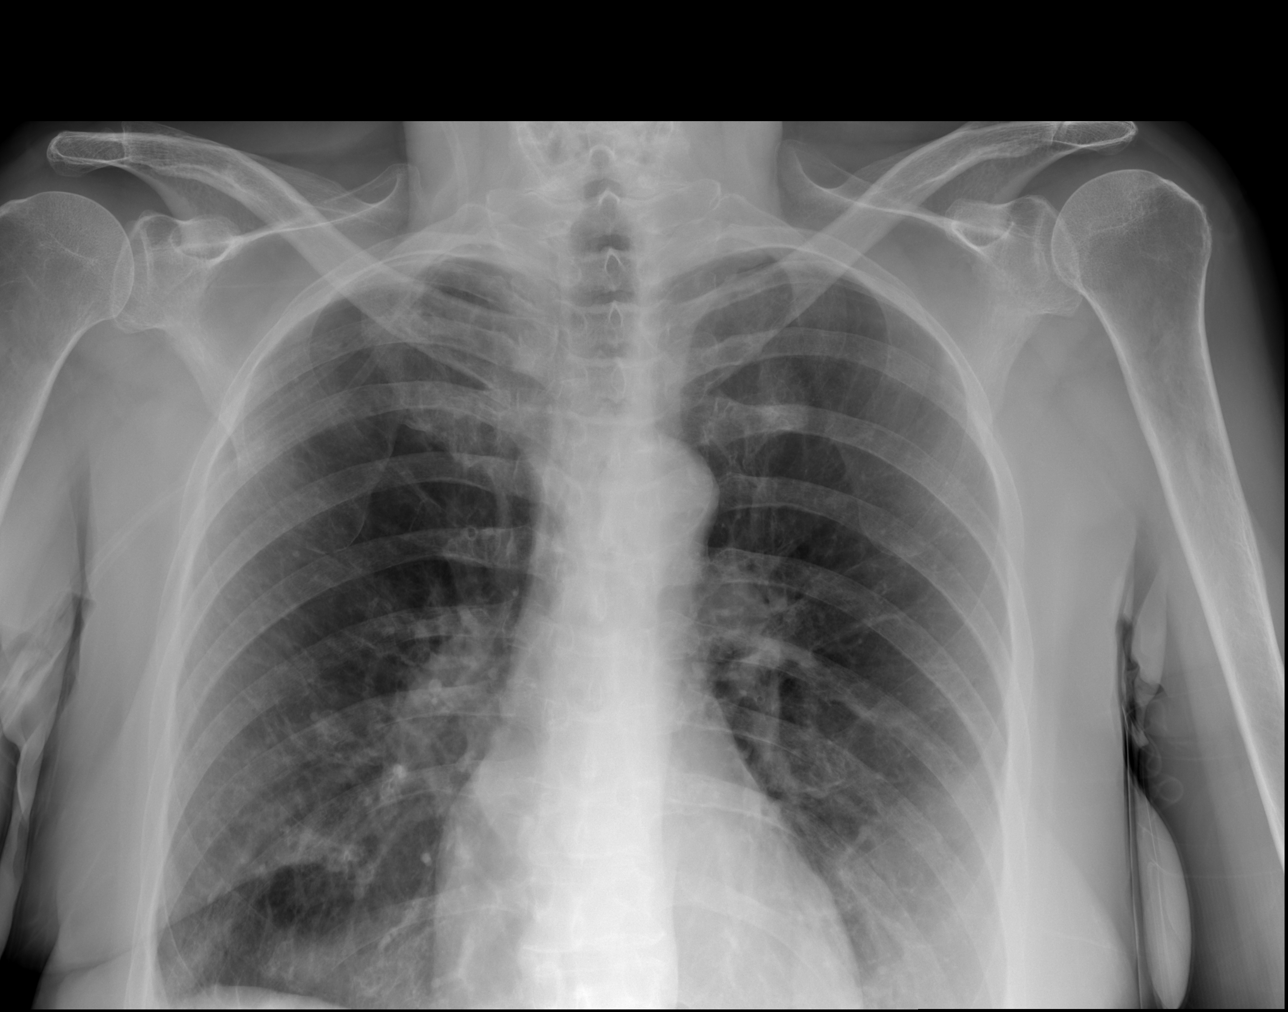

[x chest ap (2 of 2)]
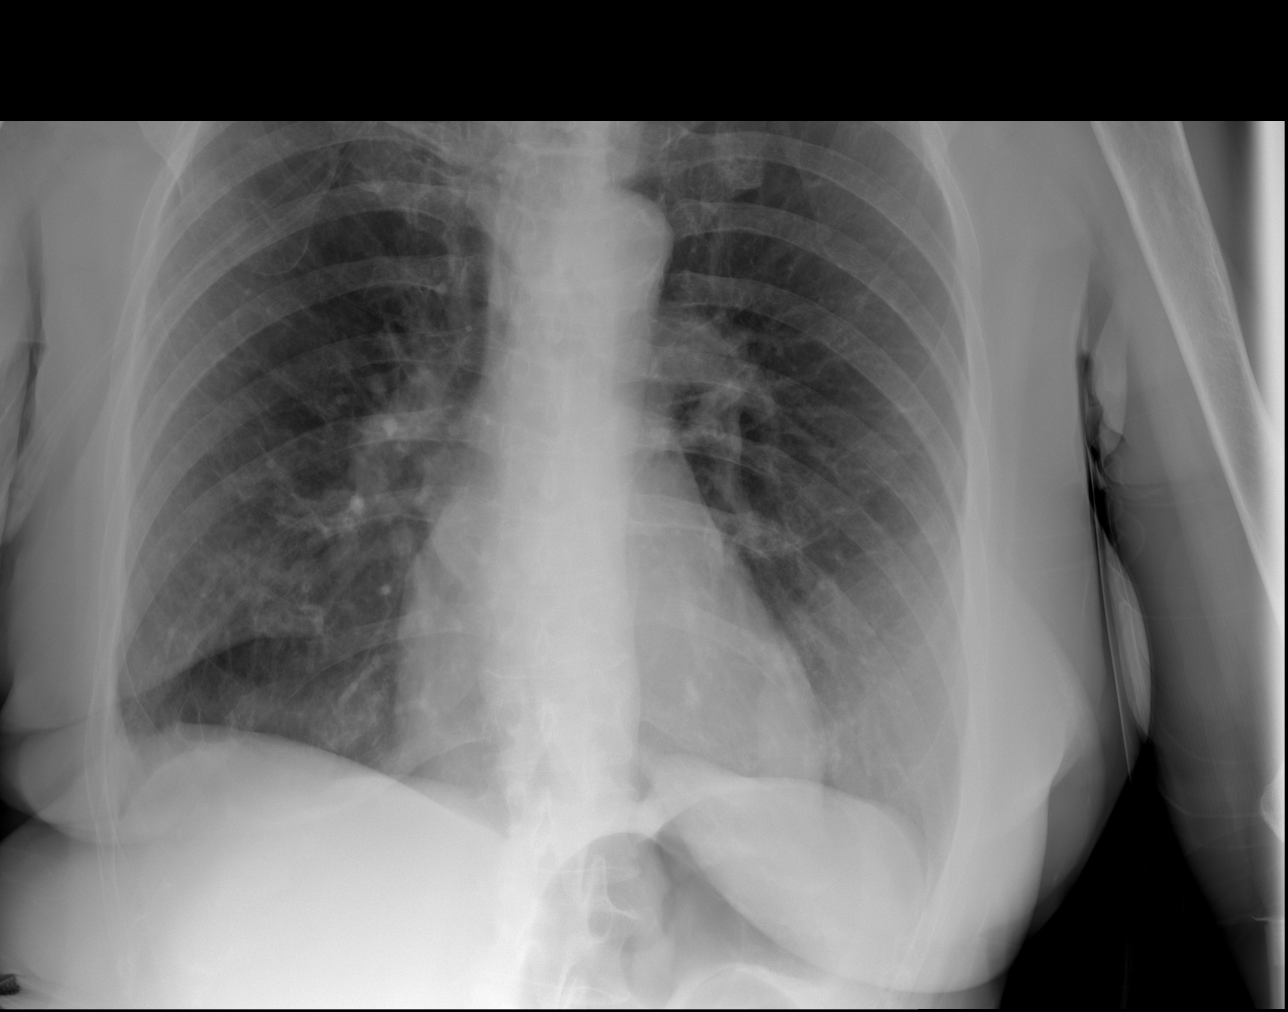

[3 of 3 positions shown; findings below may reference images not displayed]

FINDINGS: Stable lung volumes. Stable cardiac size and mediastinal contours.
Visualized tracheal air column is within normal limits. No
pneumothorax, pulmonary edema, pleural effusion or acute pulmonary
opacity. Incidental breast implants. Osteopenia. Stable visualized
osseous structures.
IMPRESSION: No acute cardiopulmonary abnormality.

## 2014-08-30 DIAGNOSIS — R0609 Other forms of dyspnea: Secondary | ICD-10-CM | POA: Diagnosis not present

## 2014-08-30 DIAGNOSIS — R911 Solitary pulmonary nodule: Secondary | ICD-10-CM | POA: Diagnosis not present

## 2014-08-30 DIAGNOSIS — J439 Emphysema, unspecified: Secondary | ICD-10-CM | POA: Diagnosis not present

## 2014-08-30 DIAGNOSIS — R0902 Hypoxemia: Secondary | ICD-10-CM | POA: Diagnosis not present

## 2014-09-04 ENCOUNTER — Encounter: Payer: Self-pay | Admitting: Primary Care

## 2014-09-04 ENCOUNTER — Ambulatory Visit (INDEPENDENT_AMBULATORY_CARE_PROVIDER_SITE_OTHER): Payer: Medicare Other | Admitting: Primary Care

## 2014-09-04 VITALS — BP 150/78 | HR 85 | Temp 98.2°F | Wt 175.4 lb

## 2014-09-04 DIAGNOSIS — M62830 Muscle spasm of back: Secondary | ICD-10-CM | POA: Diagnosis not present

## 2014-09-04 MED ORDER — TIZANIDINE HCL 2 MG PO TABS: ORAL_TABLET | ORAL | Status: DC

## 2014-09-04 NOTE — Progress Notes (Signed)
Subjective:    Patient ID: Mckenzie Mclaughlin, female    DOB: 07-02-1940, 75 y.o.   MRN: [id number]  HPI  [name] is a 74 year old female who presents today with a chief complaint of muscle spasms. Her muscle spasms are located to the mid back on both right and left sides since she woke up 4 days ago. Her spasms have not improved despite taking aspirin. She describes her pain as sharp, intermittent, and tight. Position changes make her spasms worse. Denies recent injury or falls.  Review of Systems  Respiratory: Negative for shortness of breath.   Cardiovascular: Negative for chest pain.  Musculoskeletal: Positive for myalgias and back pain.  Neurological: Negative for dizziness.       Past Medical History  Diagnosis Date  . COPD, severe 07/29/98  . Coronary artery disease 01/22/99    Non Q-wave MI, stent RCA  . Hypertension     03/30/00  . History of ETT 08/03/09    Myoview Normal  . History of ETT 05/1999    Cardiolite pos ETT, neg Cardiolite  . Hx of cardiac catheterization 08/29/01    60% RCA 0/w 20-30% lesions  . Hx of cardiac catheterization 01/22/99    w/Stent 90% RCA lesion  . History of ETT 09/02/01    Normal  . History of ETT 04/28/2006    Myoview normal EF 83%  . History of CT scan of head 08/02/09    w/o mild age appropr atrophy  . History of blurry vision 05/06-05/10/2009    Hospital [facility] CP R/O'D Blurry vision, smoking  . History of ETT 04/10/11    normal EF and no ischemia per Dr. Saralyn Pilar  . COPD (chronic obstructive pulmonary disease)   . Anxiety   . Shortness of breath   . Pneumothorax 2/14  . Esophageal dilatation     Pt needs appple sauce to take meds.  . Esophageal stricture     PT needs apple sauce to take meds  . GERD (gastroesophageal reflux disease) 08/29/98    severe-no meds now  . On home oxygen therapy     as needed-has a travel tank  . Complication of anesthesia   . PONV (postoperative nausea and vomiting)     ad breast remocved and  see was under anesthesia a long time  . Anginal pain     see dr [name]  "Spams"  . Dysrhythmia     Palpations at times. last time 10/29 ish 2014  . Mental disorder   . Depression   . Head injury, acute, with loss of consciousness 11/2012    unsure how long  . Constipation   . History of blood transfusion   . Cancer     side of head- skin cancer, squamous cell ca on left foot.    History   Social History  . Marital Status: Married    Spouse Name: N/A  . Number of Children: 0  . Years of Education: N/A   Occupational History  . Retired/Disability     Teacher, English as a foreign language [employer]   Social History Main Topics  . Smoking status: Former Smoker -- 0.40 packs/day for 50 years    Types: Cigarettes    Quit date: 02/28/2012  . Smokeless tobacco: Never Used     Comment: 1/4 PPD as of 2012  . Alcohol Use: No  . Drug Use: No  . Sexual Activity: Not on file   Other Topics Concern  . Not  on file   Social History Narrative   Retired Disability 2004 Lung disease   Married 1962   Prev rode her [place] with husband    Past Surgical History  Procedure Laterality Date  . Laminectomy  1976    Disc Removal X 2 Lumbar  . Esophageal dilation  06/00    EGD  . Oophorectomy    . Abdominal hysterectomy    . Colonoscopy    . Mastectomy  03/1976    Bilateral due FCBD with implants [facility])  . Carotid stent Right 2000  . Back surgery    . Tissue expander placement Right 02/03/2013    Procedure: REMOVAL OF RIGHT BREAST IMPLANT AND IMPLANT MATERIAL  CAPSULECTOMY;  Surgeon: Irene Limbo, MD;  Location: [facility];  Service: Plastics;  Laterality: Right;  . Breast surgery      implant removal, R breast    Family History  Problem Relation Age of Onset  . Stroke Mother   . Heart disease Mother     MI  . Cancer Father     Lung  . COPD Brother   . Cancer Brother     Lung with mets 2011  . Heart disease Brother     CAD  . Heart disease Sister      cirrhosis due heart disease  . Cirrhosis Sister   . Colon cancer Neg Hx   . Breast cancer Neg Hx     Allergies  Allergen Reactions  . Amoxicillin Other (See Comments)    Shortness of breath.  . Doxycycline Nausea Only and Other (See Comments)    Dizzy  . Sertraline Hcl     REACTION: trembling    Current Outpatient Prescriptions on File Prior to Visit  Medication Sig Dispense Refill  . albuterol (PROAIR HFA) 108 (90 BASE) MCG/ACT inhaler Inhale 2 puffs into the lungs every 6 (six) hours as needed. 18 g 3  . ALPRAZolam (XANAX) 0.5 MG tablet TAKE ONE TABLET BY MOUTH THREE TIMES DAILY AS NEEDED 90 tablet 3  . aspirin 81 MG tablet Take 81 mg by mouth daily.    Marland Kitchen atorvastatin (LIPITOR) 20 MG tablet TAKE ONE TABLET BY MOUTH ONCE DAILY 30 tablet 11  . bisacodyl (DULCOLAX) 10 MG suppository Place 1 suppository (10 mg total) rectally as needed for moderate constipation. 12 suppository 0  . budesonide-formoterol (SYMBICORT) 160-4.5 MCG/ACT inhaler Inhale 2 puffs into the lungs 2 (two) times daily. 1 Inhaler 5  . clopidogrel (PLAVIX) 75 MG tablet TAKE ONE TABLET BY MOUTH ONCE DAILY 30 tablet 5  . cyanocobalamin (,VITAMIN B-12,) 1000 MCG/ML injection Inject 1 mL (1,000 mcg total) into the muscle every 30 (thirty) days. 1 mL 0  . furosemide (LASIX) 20 MG tablet Take 20 mg by mouth daily.    . metoprolol succinate (TOPROL-XL) 50 MG 24 hr tablet TAKE ONE TABLET BY MOUTH ONCE DAILY. TAKE WITH OR IMMEDIATELY FOLLOWING A MEAL 30 tablet 5  . nitroGLYCERIN (NITROSTAT) 0.4 MG SL tablet Place 1 tablet (0.4 mg total) under the tongue every 5 (five) minutes as needed for chest pain (max 3 doses in 15 min). 25 tablet 2  . NON FORMULARY Oxygen 2 liters as needed    . polyethylene glycol powder (GLYCOLAX/MIRALAX) powder Take 17 g by mouth 2 (two) times daily as needed. 500 g 1  . predniSONE (DELTASONE) 10 MG tablet Take 10 mg by mouth every other day.    . tiotropium (SPIRIVA HANDIHALER) 18 MCG inhalation  capsule Place  18 mcg into inhaler and inhale at bedtime.     Marland Kitchen venlafaxine (EFFEXOR) 37.5 MG tablet TAKE ONE TABLET BY MOUTH TWICE DAILY 60 tablet 12  . [DISCONTINUED] clonazePAM (KLONOPIN) 0.5 MG tablet Take 0.5 tablets (0.25 mg total) by mouth 2 (two) times daily as needed for anxiety. 30 tablet 0   No current facility-administered medications on file prior to visit.    BP 150/78 mmHg  Pulse 85  Temp(Src) 98.2 F (36.8 C) (Oral)  Wt 175 lb 6.4 oz (79.561 kg)  SpO2 97%    Objective:   Physical Exam  Constitutional: She appears well-nourished.  Cardiovascular: Normal rate and regular rhythm.   Pulmonary/Chest: Effort normal and breath sounds normal.  Musculoskeletal:       Thoracic back: She exhibits tenderness and spasm.          Assessment & Plan:  Back spasms:  Tightness and tenderness to mid thoracic spine bilaterally. No recent injury. Present upon waking 4 days ago. Will start low dose muscle relaxer to take at bedtime PRN. Drowsiness precautions provided. Heating pad and massage for pain. Follow up as needed.

## 2014-09-04 NOTE — Progress Notes (Signed)
Pre visit review using our clinic review tool, if applicable. No additional management support is needed unless otherwise documented below in the visit note. 

## 2014-09-04 NOTE — Patient Instructions (Addendum)
You may take Tizanidine tablets for muscle spasms. Take 1 tablet by mouth at bedtime as needed for tightness and spasm to back. This medication may make you drowsy. Try applying heat and massaging your back to help with tightness  Muscle Cramps and Spasms Muscle cramps and spasms occur when a muscle or muscles tighten and you have no control over this tightening (involuntary muscle contraction). They are a common problem and can develop in any muscle. The most common place is in the calf muscles of the leg. Both muscle cramps and muscle spasms are involuntary muscle contractions, but they also have differences:   Muscle cramps are sporadic and painful. They may last a few seconds to a quarter of an hour. Muscle cramps are often more forceful and last longer than muscle spasms.  Muscle spasms may or may not be painful. They may also last just a few seconds or much longer. CAUSES  It is uncommon for cramps or spasms to be due to a serious underlying problem. In many cases, the cause of cramps or spasms is unknown. Some common causes are:   Overexertion.   Overuse from repetitive motions (doing the same thing over and over).   Remaining in a certain position for a long period of time.   Improper preparation, form, or technique while performing a sport or activity.   Dehydration.   Injury.   Side effects of some medicines.   Abnormally low levels of the salts and ions in your blood (electrolytes), especially potassium and calcium. This could happen if you are taking water pills (diuretics) or you are pregnant.  Some underlying medical problems can make it more likely to develop cramps or spasms. These include, but are not limited to:   Diabetes.   Parkinson disease.   Hormone disorders, such as thyroid problems.   Alcohol abuse.   Diseases specific to muscles, joints, and bones.   Blood vessel disease where not enough blood is getting to the muscles.  HOME CARE  INSTRUCTIONS   Stay well hydrated. Drink enough water and fluids to keep your urine clear or pale yellow.  It may be helpful to massage, stretch, and relax the affected muscle.  For tight or tense muscles, use a warm towel, heating pad, or hot shower water directed to the affected area.  If you are sore or have pain after a cramp or spasm, applying ice to the affected area may relieve discomfort.  Put ice in a plastic bag.  Place a towel between your skin and the bag.  Leave the ice on for 15-20 minutes, 03-04 times a day.  Medicines used to treat a known cause of cramps or spasms may help reduce their frequency or severity. Only take over-the-counter or prescription medicines as directed by your caregiver. SEEK MEDICAL CARE IF:  Your cramps or spasms get more severe, more frequent, or do not improve over time.  MAKE SURE YOU:   Understand these instructions.  Will watch your condition.  Will get help right away if you are not doing well or get worse. Document Released: 09/05/2001 Document Revised: 07/11/2012 Document Reviewed: 03/02/2012 [facility] Patient Information 2015 [place], [place]. This information is not intended to replace advice given to you by your health care provider. Make sure you discuss any questions you have with your health care provider.  It was nice meeting you!

## 2014-09-06 IMAGING — CT CT ANGIO CHEST
2 of 6 series · 17 of 36 positions shown · IV contrast (APPLIED)
Comparison: Chest radiograph performed earlier today at [DATE] p.m.,
and CT of the chest performed 01/13/2010

CLINICAL DATA: Shortness of breath; difficulty breathing.

EXAM:
CT ANGIOGRAPHY CHEST WITH CONTRAST
TECHNIQUE: Multidetector CT imaging of the chest was performed using the
standard protocol during bolus administration of intravenous
contrast. Multiplanar CT image reconstructions and MIPs were
obtained to evaluate the vascular anatomy.
CONTRAST:  100 mL of Isovue 370 IV contrast

[Series 5: pe 1.0 thins · axial · 0.66mm/px · z∈[-624,-337]mm · 16 of 323 slices shown]
[im 18/323  lung]
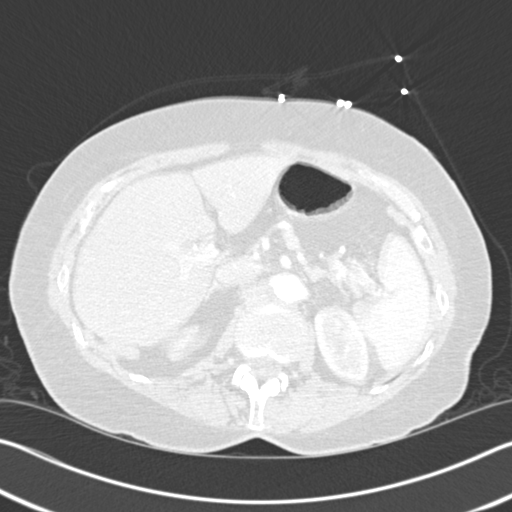
[im 36/323  mediastinal]
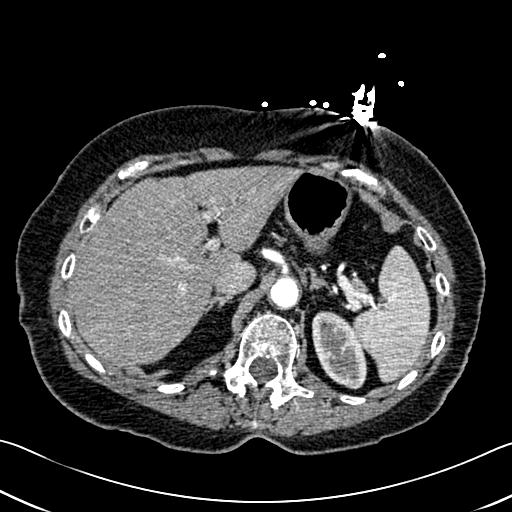
[im 54/323  lung]
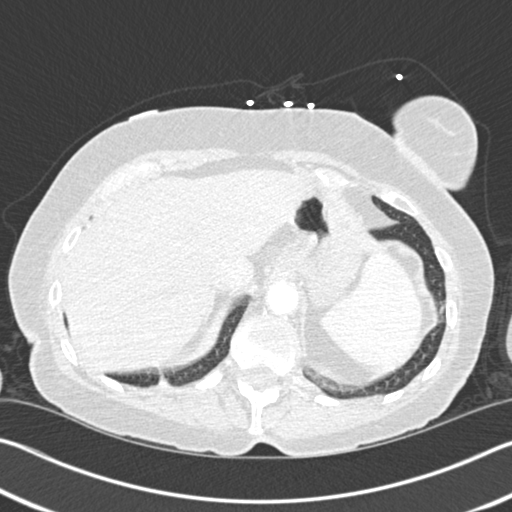
[im 72/323  mediastinal]
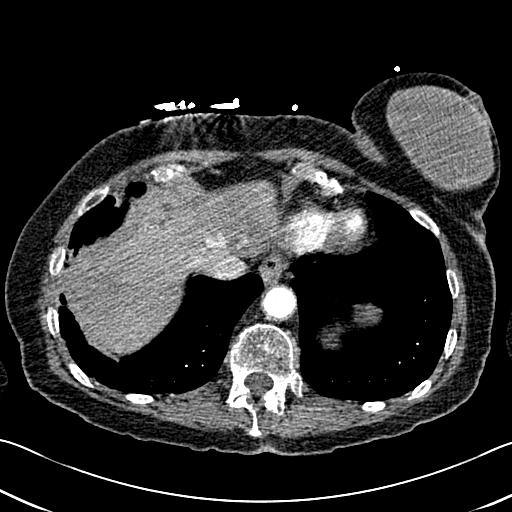
[im 90/323  lung]
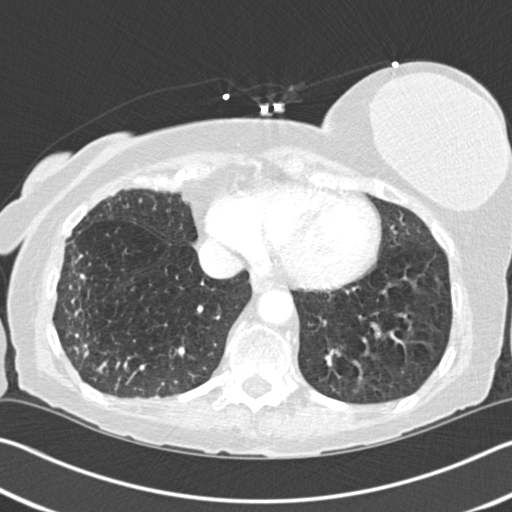
[im 108/323  mediastinal]
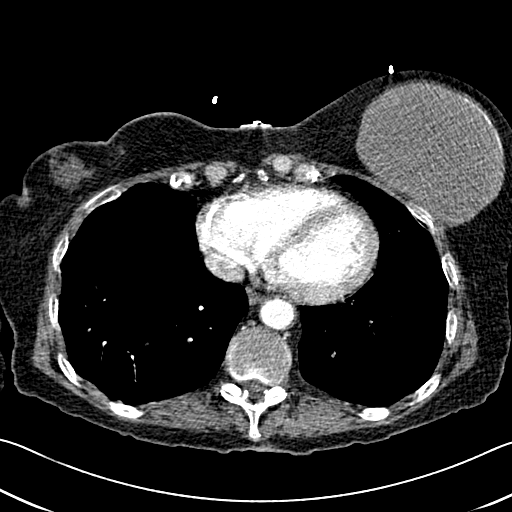
[im 126/323  lung]
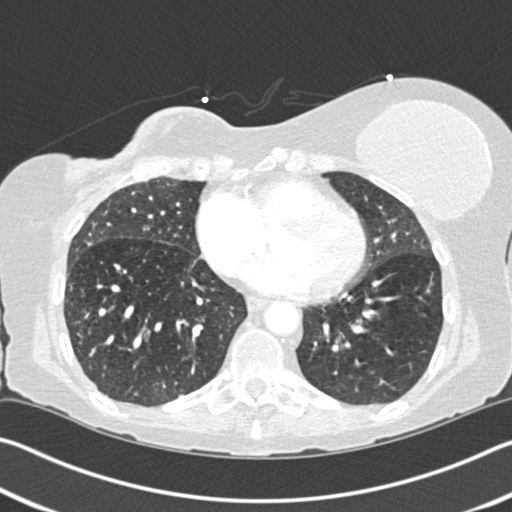
[im 144/323  mediastinal]
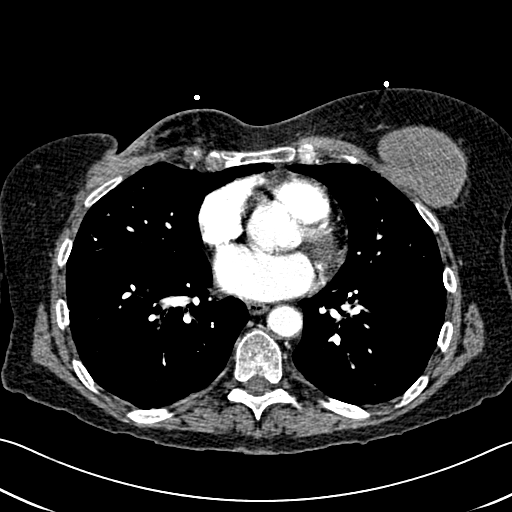
[im 179/323  lung]
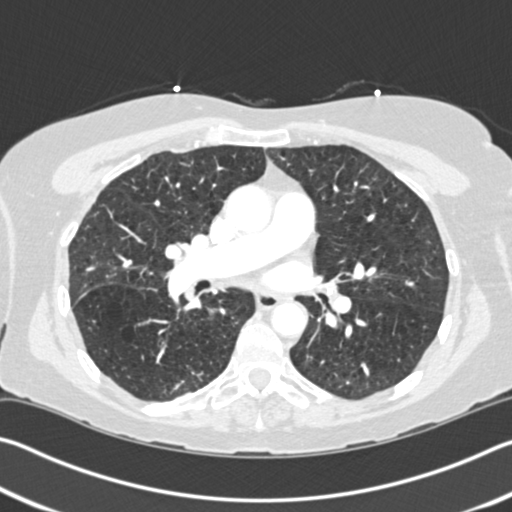
[im 197/323  mediastinal]
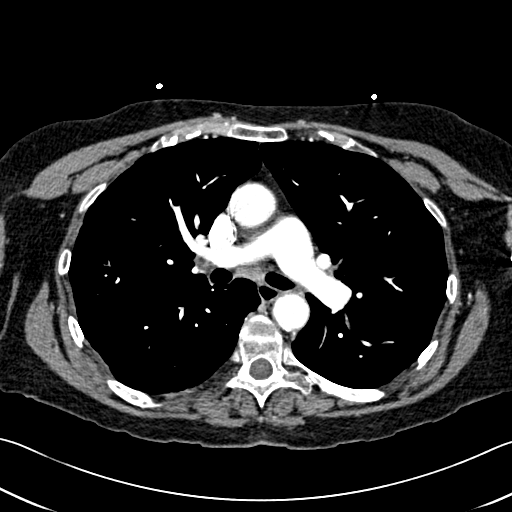
[im 215/323  lung]
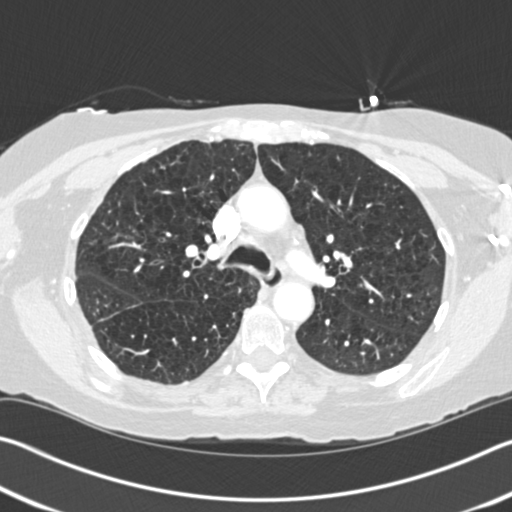
[im 233/323  mediastinal]
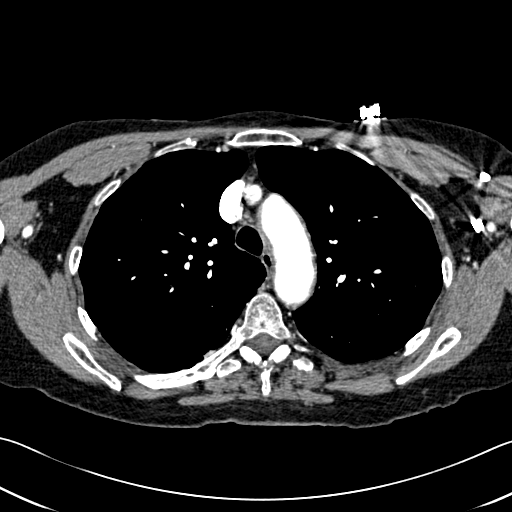
[im 251/323  lung]
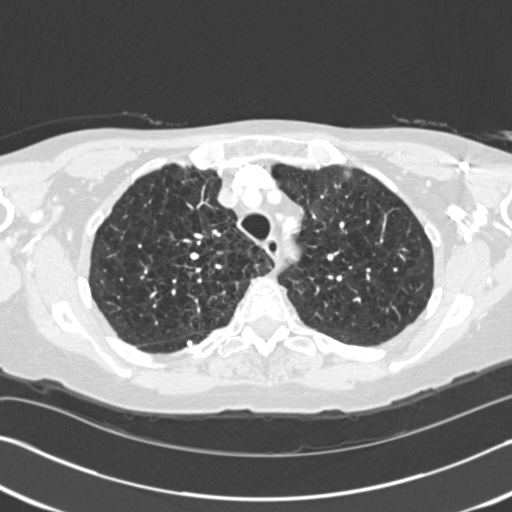
[im 269/323  mediastinal]
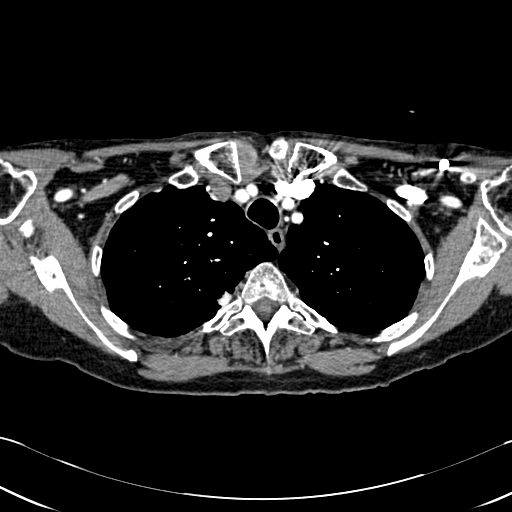
[im 287/323  lung]
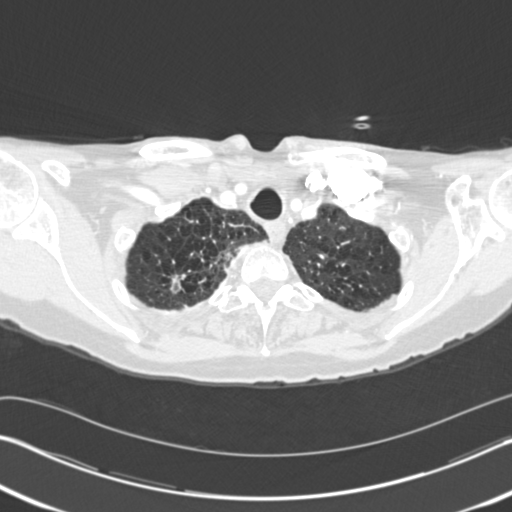
[im 305/323  mediastinal]
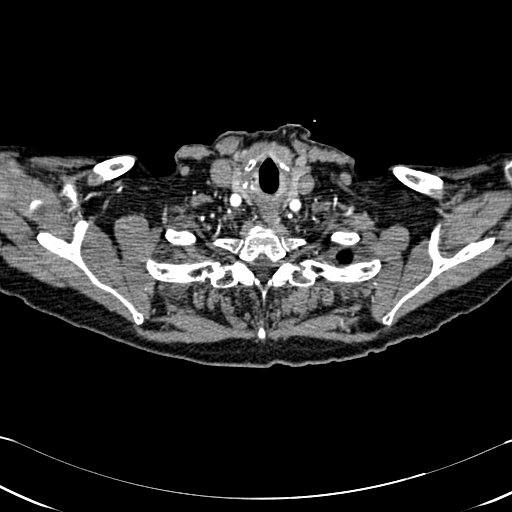

[Series 7: cor pe 2.0 mpr · coronal · 0.67mm/px · 1 of 103 slices shown]
[im 52/103  mediastinal]
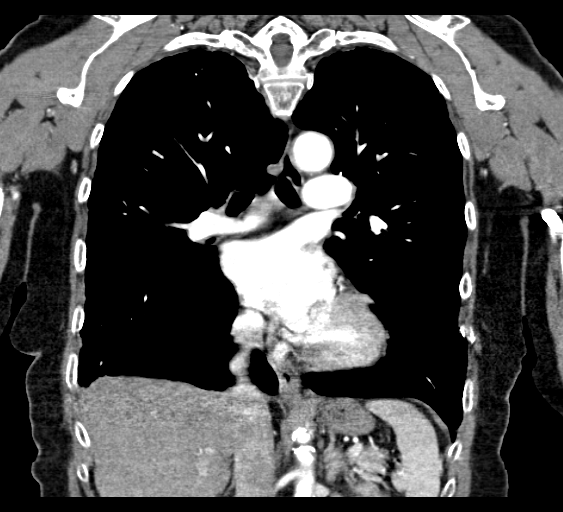

[17 of 36 positions shown; findings below may reference images not displayed]

FINDINGS: There is no evidence of pulmonary embolus.

A cavitary focus is noted at the right lung apex, measuring 1.8 cm,
with minimal calcification. An adjacent 1.1 cm pleural based nodular
density is noted, with associated pleural thickening and scarring.
This could reflect underlying atypical infectious process. Chronic
sequelae of tuberculosis cannot be entirely excluded.

Additional tiny scattered peripheral opacities are seen within the
right lower lobe, with central cavitation, raising suspicion for
additional infectious opacities. Emphysema is noted bilaterally,
particularly at the upper lung lobes. No significant pleural
effusion or pneumothorax is seen.

The mediastinum is unremarkable in appearance. Visualize the nose
remain normal in size. No pericardial effusion is identified. The
great vessels are grossly unremarkable in appearance. Incidental
note is made of a direct origin of the left vertebral artery from
the aortic arch. No axillary lymphadenopathy is seen. A 1.0 cm
heterogeneous nodule is noted within the right thyroid lobe.

A 1.8 cm hypodensity within the medial right hepatic lobe is
nonspecific but may reflect a cyst. The visualized portions of the
liver and spleen are otherwise unremarkable. The visualized portions
of the pancreas, stomach, adrenal glands and kidneys are within
normal limits. The patient's left breast implant is grossly
unremarkable in appearance, with scattered associated calcification.
A chronic soft tissue defect is noted at the right breast, with
associated skin thickening. Would correlate for any associated skin
findings.

No acute osseous abnormalities are seen.

Review of the MIP images confirms the above findings.
IMPRESSION: 1. No evidence of pulmonary embolus.
2. Cavitary focus at the right lung apex, measuring 1.8 cm, with
minimal calcification. Additional tiny scattered peripheral
opacities within the right lower lobe, with central cavitation.
cm pleural-based nodular density at the right lung apex, with
associated pleural thickening and scarring. Findings are suspicious
for an atypical infectious process; chronic sequelae of tuberculosis
cannot be excluded.
3. Emphysema noted bilaterally, particularly at the upper lung
lobes.
4. Possible hepatic cyst noted.
5. Chronic soft tissue defect at the right breast, with associated
skin thickening. Would correlate for any associated skin findings.
6. 1.0 cm heterogeneous nodule within the right thyroid lobe.
Consider further evaluation with thyroid ultrasound. If patient is
clinically hyperthyroid, consider nuclear medicine thyroid uptake
and scan.

## 2014-09-06 IMAGING — CR DG CHEST 1V PORT
1 series · 2 of 2 positions shown · non-contrast
Comparison: 10/27/2012

CLINICAL DATA: Short of breath.

EXAM:
PORTABLE CHEST - 1 VIEW

[Series 1: ap · 0.17mm/px · 2 of 2 slices shown]
[im 1/2]
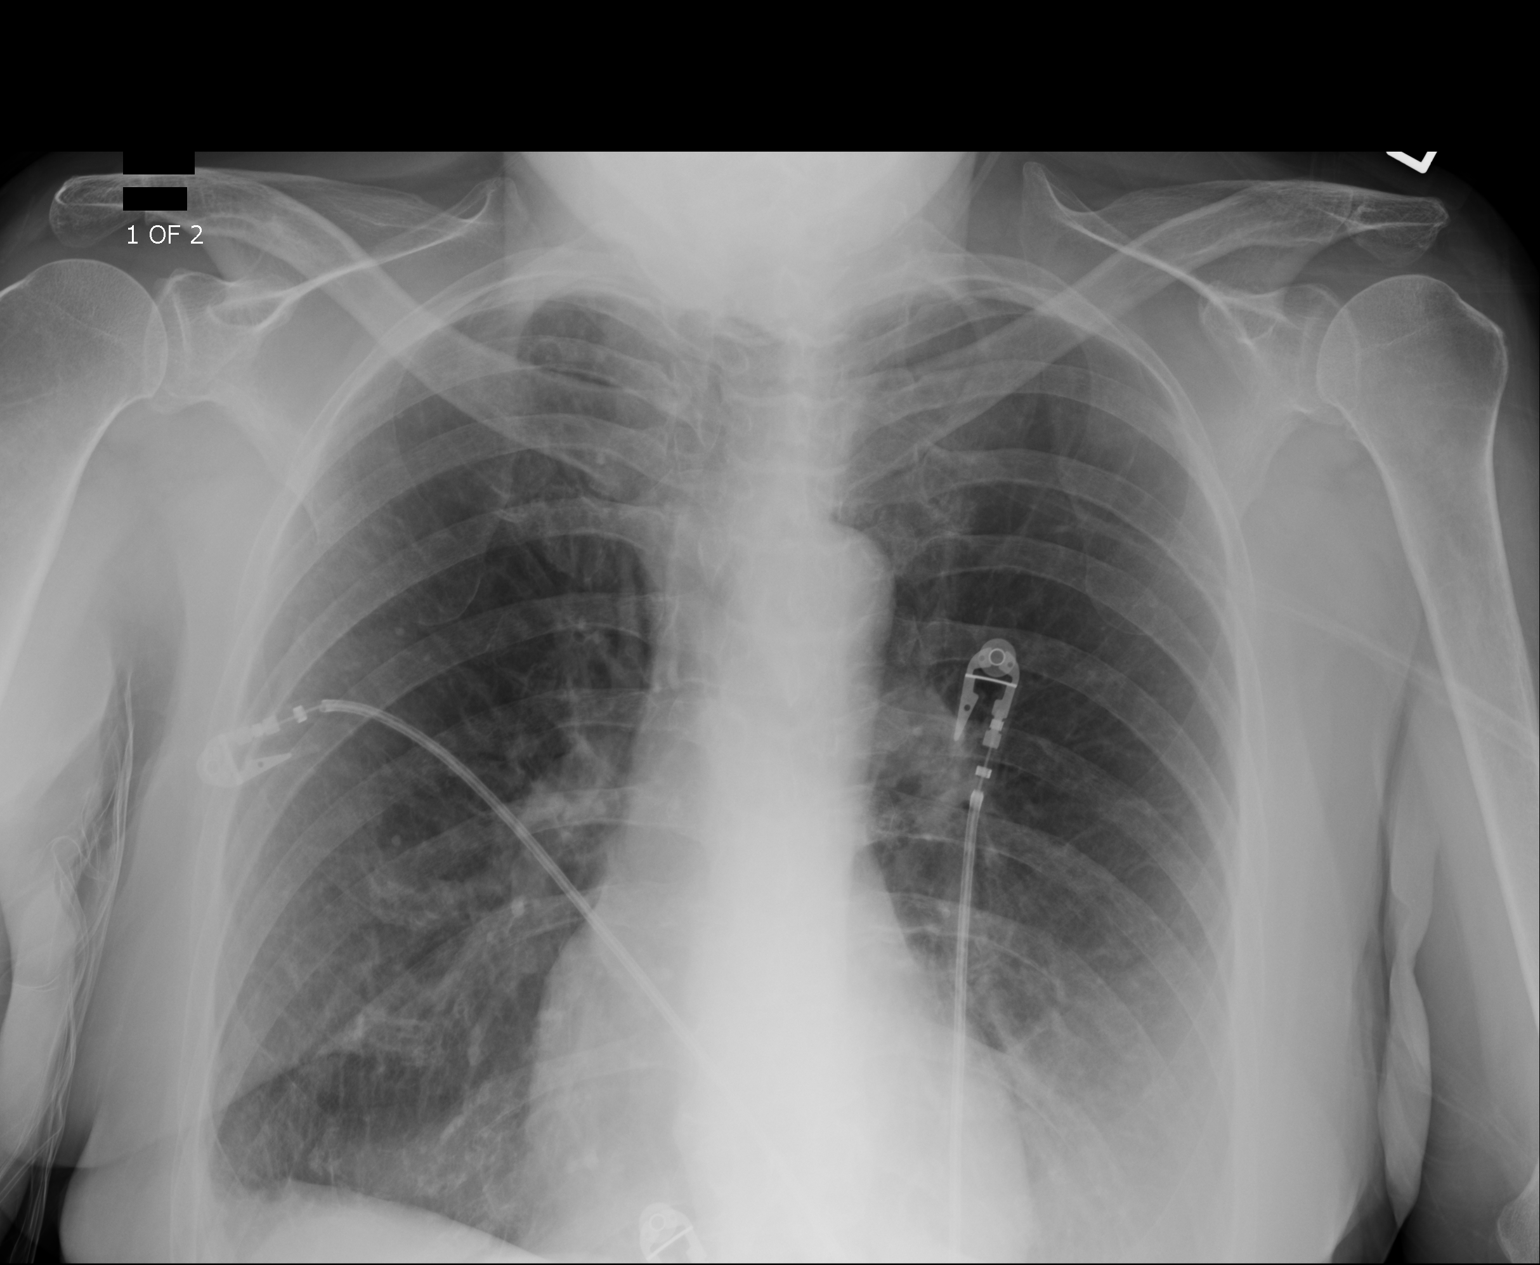
[im 2/2]
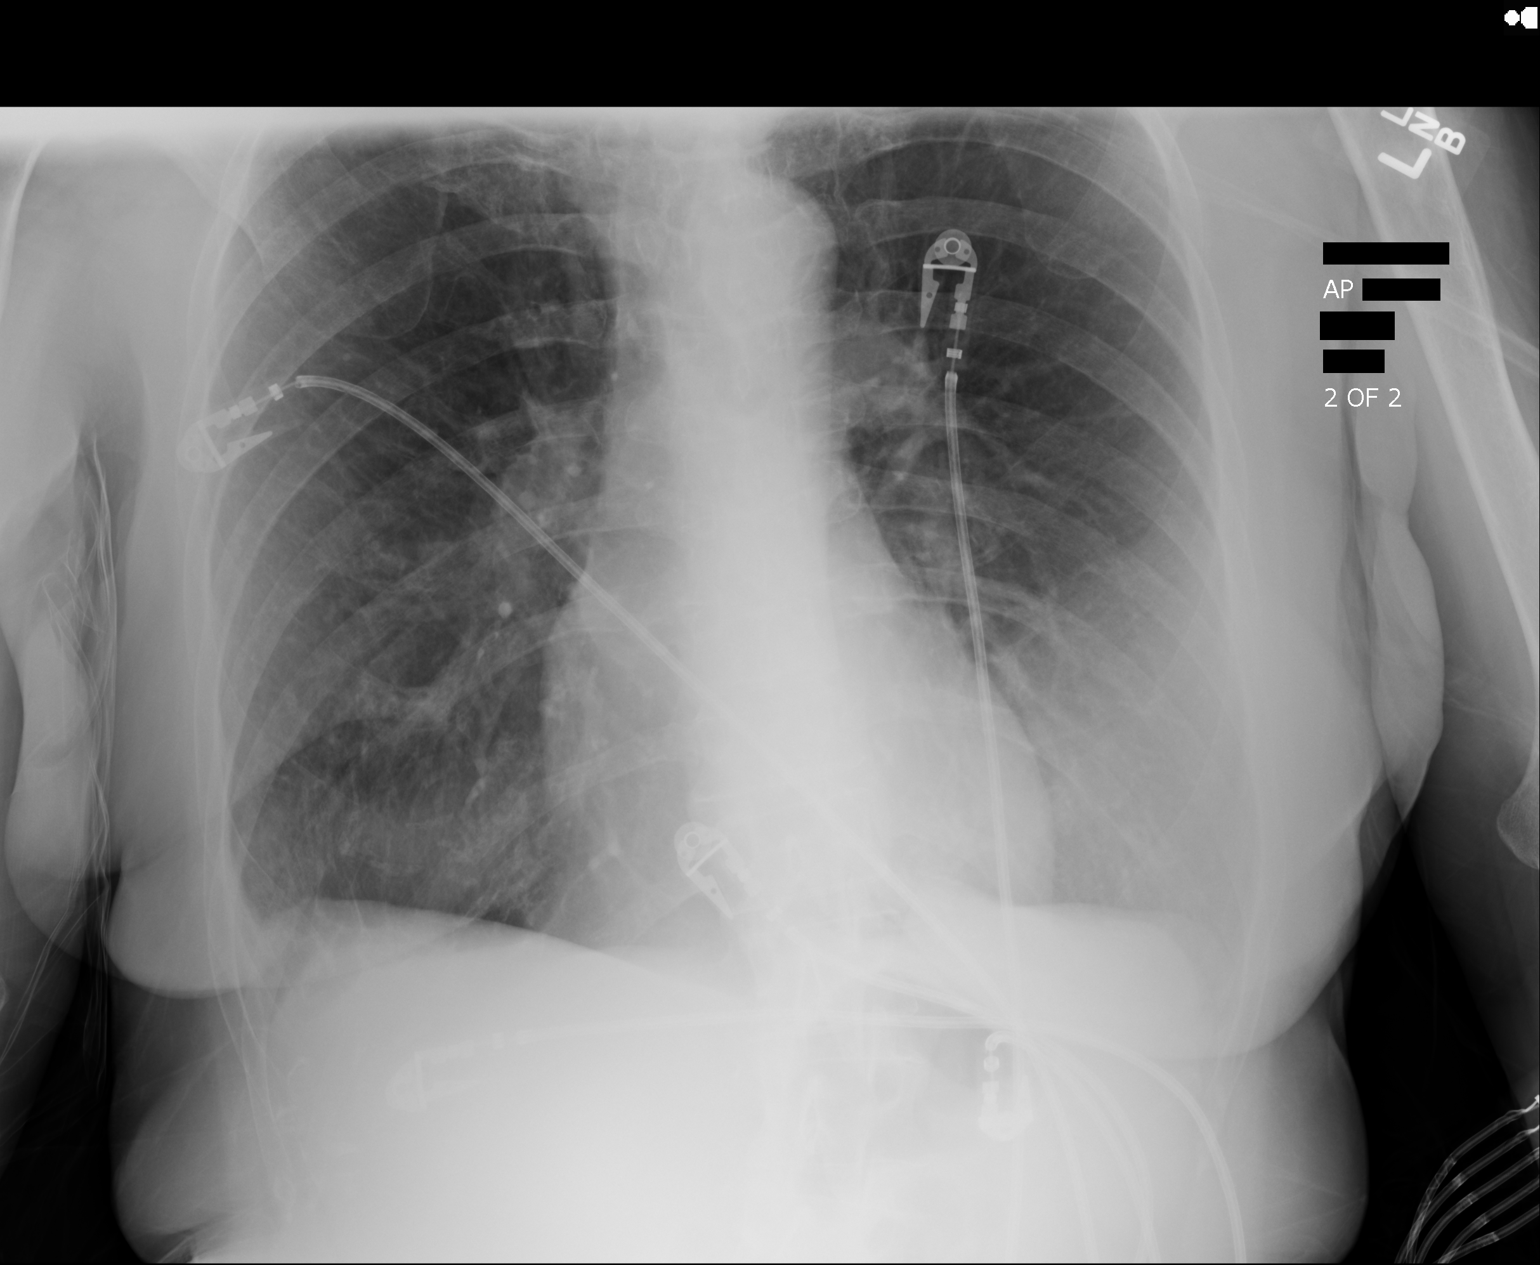

[2 of 2 positions shown; findings below may reference images not displayed]

FINDINGS: Cardiac silhouette is normal in size. Normal mediastinal and hilar
contours.

There are mildly prominent bronchovascular markings. Lungs are
hyperexpanded but otherwise clear. No pleural effusion or
pneumothorax. The bony thorax is demineralized but grossly intact.
No change from the prior study.
IMPRESSION: No acute cardiopulmonary disease.

## 2014-09-07 IMAGING — CR DG CHEST 2V
1 series · 3 of 3 positions shown · non-contrast
Comparison: CT ANGIO CHEST dated 05/26/2013; DG CHEST 1V PORT dated
05/26/2013; DG CHEST 1V PORT dated 10/27/2012

CLINICAL DATA: Shortness of breath, history of COPD

EXAM:
CHEST  2 VIEW

[Series 1: w chest lat · 0.14mm/px · 3 of 3 slices shown]
[im 1/3]
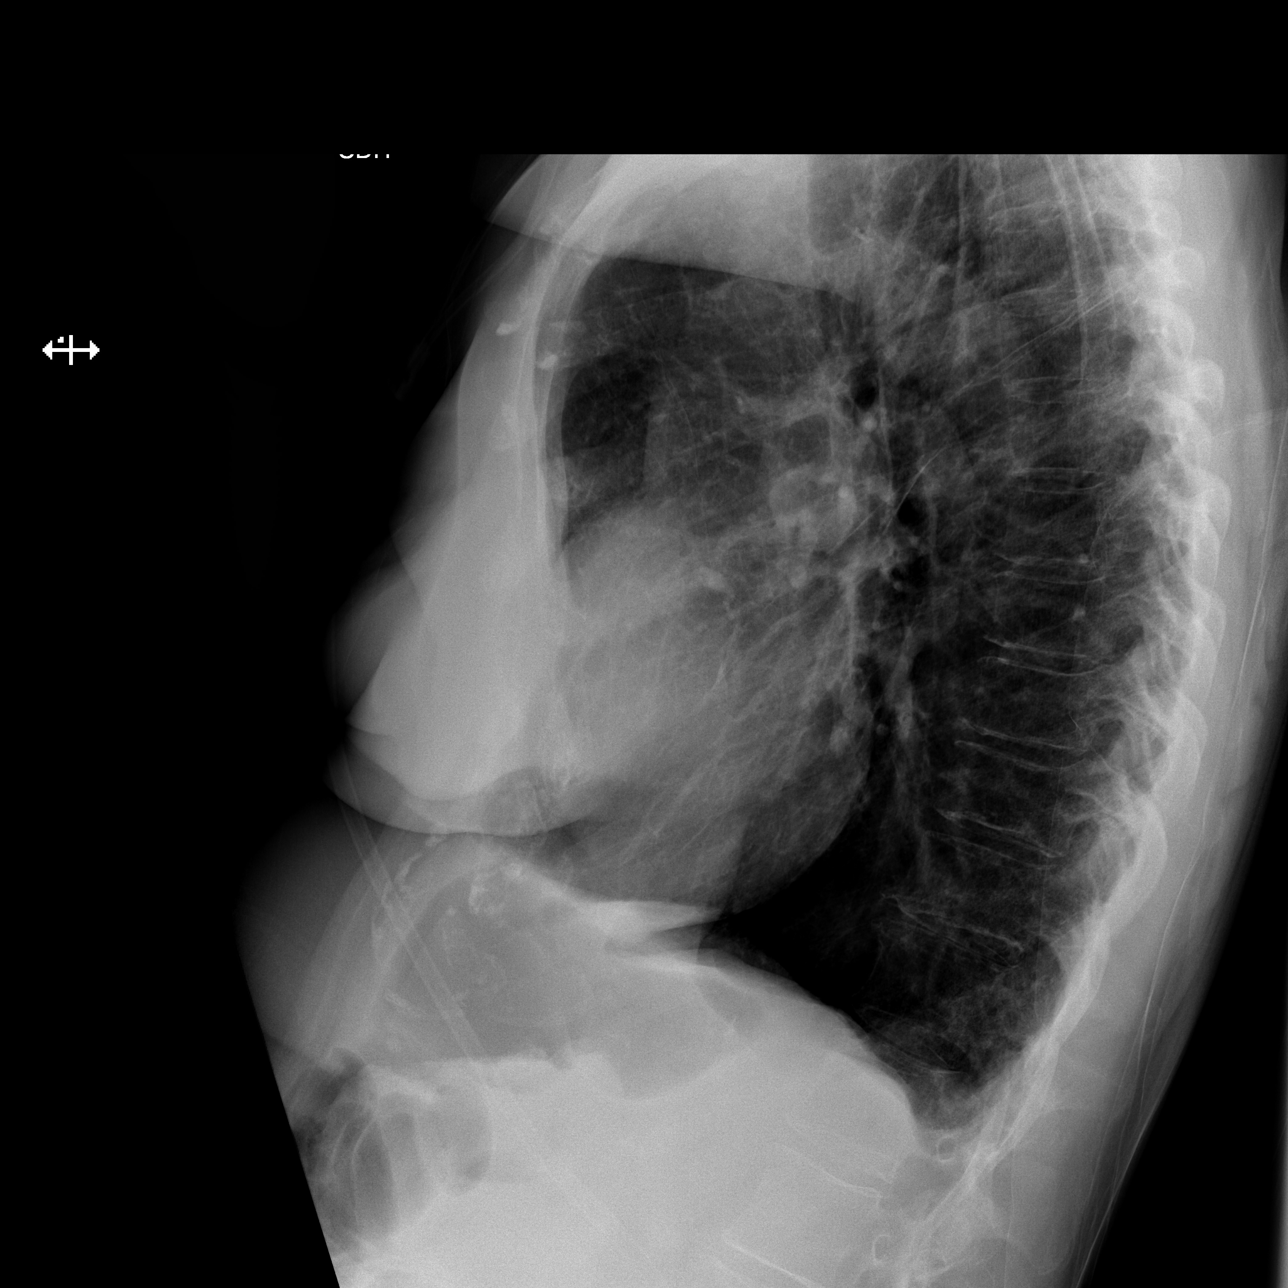
[im 2/3]
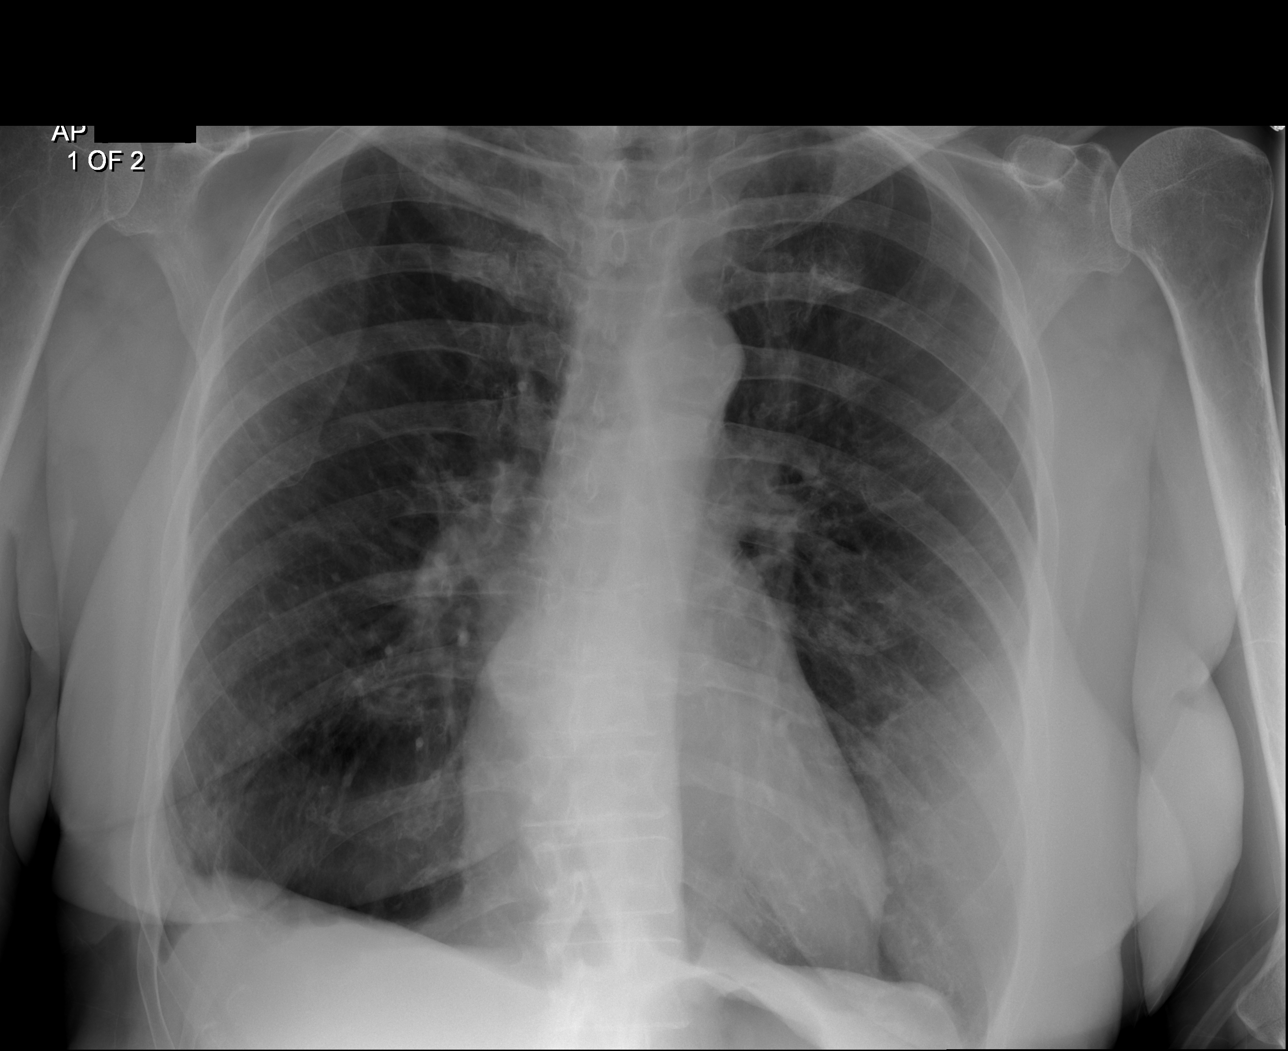
[im 3/3]
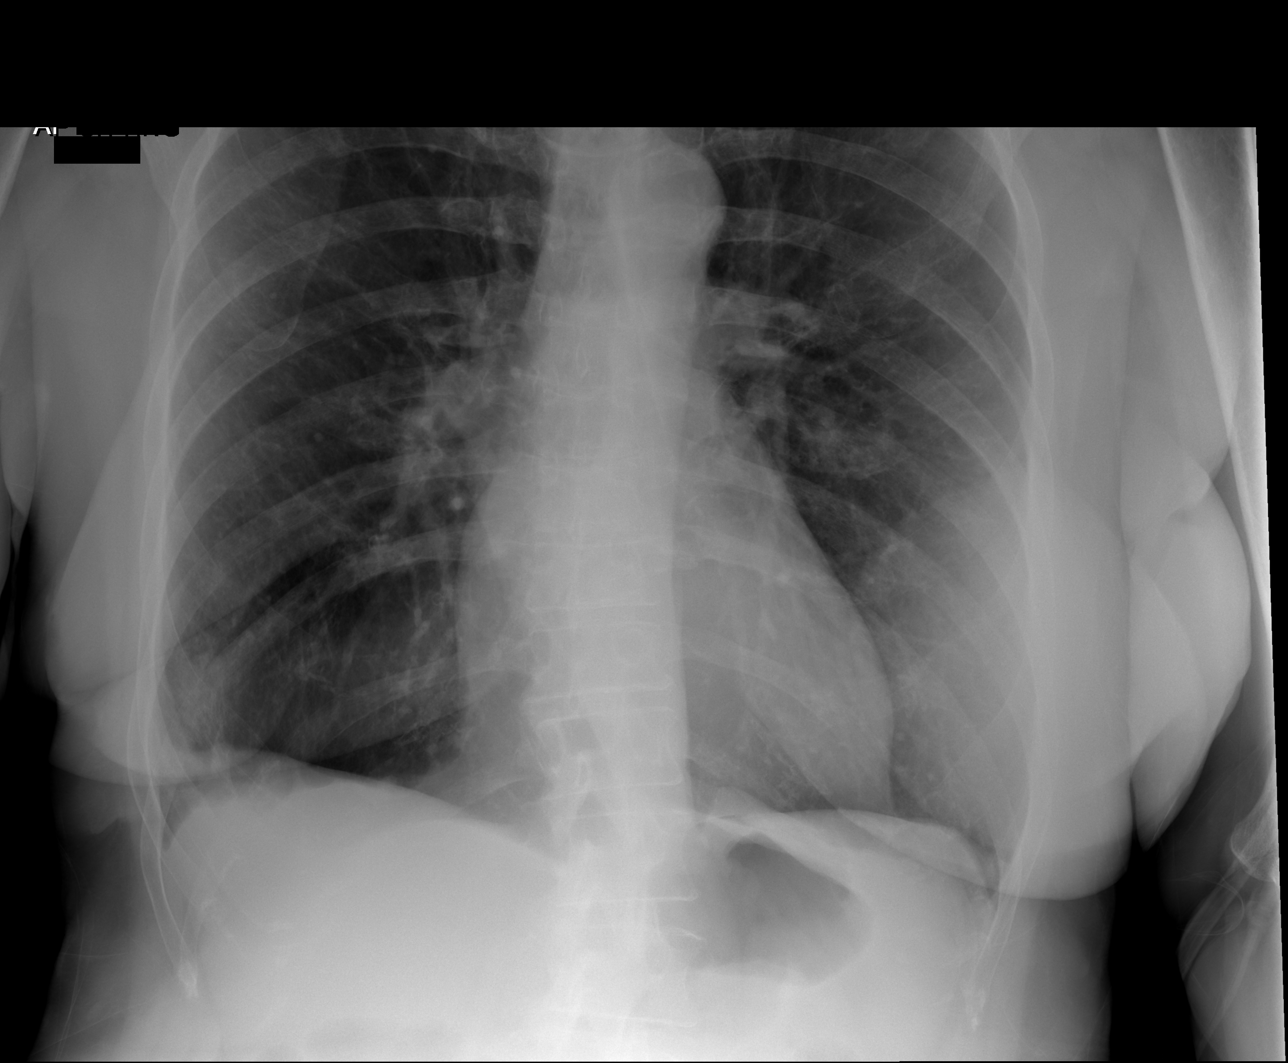

[3 of 3 positions shown; findings below may reference images not displayed]

FINDINGS: Heart size upper normal. Vascular pattern normal. Hyperinflation
consistent with COPD. Left lung is clear. On the right, there is
lateral opacity at the lung base measuring 27 x 34 mm.
IMPRESSION: Focal area of opacity lateral right lung base. This is superimposed
on COPD. This is new from prior studies. It may represent
atelectasis. Developing pneumonia is not excluded however. Would
recommend radiographic followup to ensure complete resolution of
this ovoid opacity in the 1-2 weeks.

## 2014-09-08 ENCOUNTER — Encounter: Payer: Self-pay | Admitting: Emergency Medicine

## 2014-09-08 ENCOUNTER — Emergency Department
Admission: EM | Admit: 2014-09-08 | Discharge: 2014-09-08 | Disposition: A | Discharge status: Home / Self Care | Payer: Medicare Other | Attending: Student | Admitting: Student

## 2014-09-08 DIAGNOSIS — I10 Essential (primary) hypertension: Secondary | ICD-10-CM | POA: Insufficient documentation

## 2014-09-08 DIAGNOSIS — Z79899 Other long term (current) drug therapy: Secondary | ICD-10-CM | POA: Diagnosis not present

## 2014-09-08 DIAGNOSIS — M62830 Muscle spasm of back: Secondary | ICD-10-CM | POA: Diagnosis not present

## 2014-09-08 DIAGNOSIS — Y9389 Activity, other specified: Secondary | ICD-10-CM | POA: Diagnosis not present

## 2014-09-08 DIAGNOSIS — Y288XXA Contact with other sharp object, undetermined intent, initial encounter: Secondary | ICD-10-CM | POA: Diagnosis not present

## 2014-09-08 DIAGNOSIS — Z880 Allergy status to penicillin: Secondary | ICD-10-CM | POA: Diagnosis not present

## 2014-09-08 DIAGNOSIS — S40021A Contusion of right upper arm, initial encounter: Secondary | ICD-10-CM | POA: Insufficient documentation

## 2014-09-08 DIAGNOSIS — T148XXA Other injury of unspecified body region, initial encounter: Secondary | ICD-10-CM

## 2014-09-08 DIAGNOSIS — Z7902 Long term (current) use of antithrombotics/antiplatelets: Secondary | ICD-10-CM | POA: Diagnosis not present

## 2014-09-08 DIAGNOSIS — S5012XA Contusion of left forearm, initial encounter: Secondary | ICD-10-CM | POA: Diagnosis not present

## 2014-09-08 DIAGNOSIS — S40022A Contusion of left upper arm, initial encounter: Secondary | ICD-10-CM | POA: Diagnosis not present

## 2014-09-08 DIAGNOSIS — Y998 Other external cause status: Secondary | ICD-10-CM | POA: Diagnosis not present

## 2014-09-08 DIAGNOSIS — Z7951 Long term (current) use of inhaled steroids: Secondary | ICD-10-CM | POA: Insufficient documentation

## 2014-09-08 DIAGNOSIS — R58 Hemorrhage, not elsewhere classified: Secondary | ICD-10-CM

## 2014-09-08 DIAGNOSIS — S61411A Laceration without foreign body of right hand, initial encounter: Secondary | ICD-10-CM | POA: Insufficient documentation

## 2014-09-08 DIAGNOSIS — Y9289 Other specified places as the place of occurrence of the external cause: Secondary | ICD-10-CM | POA: Diagnosis not present

## 2014-09-08 DIAGNOSIS — Z87891 Personal history of nicotine dependence: Secondary | ICD-10-CM | POA: Diagnosis not present

## 2014-09-08 DIAGNOSIS — S6991XA Unspecified injury of right wrist, hand and finger(s), initial encounter: Secondary | ICD-10-CM | POA: Diagnosis present

## 2014-09-08 DIAGNOSIS — IMO0002 Reserved for concepts with insufficient information to code with codable children: Secondary | ICD-10-CM

## 2014-09-08 LAB — URINALYSIS COMPLETE WITH MICROSCOPIC (ARMC ONLY)
Bacteria, UA: NONE SEEN
Bilirubin Urine: NEGATIVE
Glucose, UA: NEGATIVE mg/dL
KETONES UR: NEGATIVE mg/dL
NITRITE: NEGATIVE
Protein, ur: NEGATIVE mg/dL
Specific Gravity, Urine: 1.008 (ref 1.005–1.030)
pH: 5 (ref 5.0–8.0)

## 2014-09-08 MED ORDER — ACETAMINOPHEN 500 MG PO TABS
1000.0000 mg | ORAL_TABLET | Freq: Once | ORAL | Status: AC
  Administered 2014-09-08: 1000 mg via ORAL

## 2014-09-08 MED ORDER — ACETAMINOPHEN 500 MG PO TABS
ORAL_TABLET | ORAL | Status: AC
  Administered 2014-09-08: 1000 mg via ORAL
  Filled 2014-09-08: qty 2

## 2014-09-08 NOTE — ED Notes (Signed)
Patient with no complaints at this time. Respirations even and unlabored. Skin warm/dry. Discharge instructions reviewed with patient at this time. Patient given opportunity to voice concerns/ask questions. Patient discharged at this time and left Emergency Department, via wheelchair.   

## 2014-09-08 NOTE — ED Provider Notes (Signed)
[facility] Provider Note  ____________________________________________  Time seen: Approximately 2:10 AM  I have reviewed the triage vital signs and the nursing notes.   HISTORY  Chief Complaint Back Pain and Laceration    HPI [name] is a 74 y.o. female with coronary artery disease on aspirin and Plavix, hypertension, COPD with chronic 2 L home oxygen requirementpresents for evaluation of bleeding laceration right hand. Patient reports that she cut her right hand accidentally 2 days ago. Tonight she awoke at midnight to go to the bathroom and noted that cut on the right hand had started to bleed again. She also noted a small area of swelling with blue discoloration on the lateral right forearm which concerned her. She has multiple bruises on her arms reports she is "always bumping into things" she has never had a raised bruise. She also reports that for the past few days she has intermittently had back spasms but she is not having any currently. Symptoms of been ongoing, intermittent for the past 2-3 days. Current severity is mild. No modifying factors. No chest pain or difficulty breathing, no abdominal pain, no vomiting or diarrhea.   Past Medical History  Diagnosis Date  . COPD, severe 07/29/98  . Coronary artery disease 01/22/99    Non Q-wave MI, stent RCA  . Hypertension     03/30/00  . History of ETT 08/03/09    Myoview Normal  . History of ETT 05/1999    Cardiolite pos ETT, neg Cardiolite  . Hx of cardiac catheterization 08/29/01    60% RCA 0/w 20-30% lesions  . Hx of cardiac catheterization 01/22/99    w/Stent 90% RCA lesion  . History of ETT 09/02/01    Normal  . History of ETT 04/28/2006    Myoview normal EF 83%  . History of CT scan of head 08/02/09    w/o mild age appropr atrophy  . History of blurry vision 05/06-05/10/2009    [facility] CP R/O'D Blurry vision, smoking  . History of ETT 04/10/11    normal EF  and no ischemia per Dr. Saralyn Pilar  . COPD (chronic obstructive pulmonary disease)   . Anxiety   . Shortness of breath   . Pneumothorax 2/14  . Esophageal dilatation     Pt needs appple sauce to take meds.  . Esophageal stricture     PT needs apple sauce to take meds  . GERD (gastroesophageal reflux disease) 08/29/98    severe-no meds now  . On home oxygen therapy     as needed-has a travel tank  . Complication of anesthesia   . PONV (postoperative nausea and vomiting)     ad breast remocved and see was under anesthesia a long time  . Anginal pain     see dr [name]  "Spams"  . Dysrhythmia     Palpations at times. last time 10/29 ish 2014  . Mental disorder   . Depression   . Head injury, acute, with loss of consciousness 11/2012    unsure how long  . Constipation   . History of blood transfusion   . Cancer     side of head- skin cancer, squamous cell ca on left foot.    Patient Active Problem List   Diagnosis Date Noted  . Advance care planning 08/15/2014  . Constipation 06/21/2014  . CAD, multiple vessel 12/17/2013  . NSTEMI (non-ST elevated myocardial infarction) 12/17/2013  . Medicare annual wellness visit, subsequent 07/09/2013  .  Abdominal pain, other specified site 11/23/2012  . Bruising 10/05/2012  . Neck pain 08/31/2012  . Anxiety and depression 07/11/2012  . Esophageal dilatation   . Esophageal stricture   . Trochanteric bursitis of left hip 07/06/2012  . Severe malnutrition 06/22/2012  . COPD with acute exacerbation 06/17/2012  . Lung nodule 04/17/2012  . Edema of foot 04/01/2012  . Paresthesia 10/27/2011  . Right ankle injury 06/09/2011  . Atypical chest pain 05/13/2011  . Breast mass 04/17/2011  . HLD (hyperlipidemia) 02/26/2011  . History of mammogram 02/26/2011  . Osteoporosis screening 02/26/2011  . Vertigo 02/18/2011  . LIPOMA 12/11/2009  . NICOTINE ADDICTION 08/02/2009  . B12 deficiency 01/31/2009  . CONSTIPATION 01/15/2009  . HEADACHE  10/22/2006  . PREMATURE VENTRICULAR CONTRACTIONS, FREQUENT 07/19/2006  . MENOPAUSAL SYNDROME 07/19/2006  . HYPERTENSION 03/30/2000  . CORONARY ARTERY DISEASE 01/22/1999  . GERD 08/29/1998  . COPD, severe 07/29/1998    Past Surgical History  Procedure Laterality Date  . Laminectomy  1976    Disc Removal X 2 Lumbar  . Esophageal dilation  06/00    EGD  . Oophorectomy    . Abdominal hysterectomy    . Colonoscopy    . Mastectomy  03/1976    Bilateral due FCBD with implants [facility])  . Carotid stent Right 2000  . Back surgery    . Tissue expander placement Right 02/03/2013    Procedure: REMOVAL OF RIGHT BREAST IMPLANT AND IMPLANT MATERIAL  CAPSULECTOMY;  Surgeon: Irene Limbo, MD;  Location: [facility];  Service: Plastics;  Laterality: Right;  . Breast surgery      implant removal, R breast    Current Outpatient Rx  Name  Route  Sig  Dispense  Refill  . albuterol (PROAIR HFA) 108 (90 BASE) MCG/ACT inhaler   Inhalation   Inhale 2 puffs into the lungs every 6 (six) hours as needed.   18 g   3   . ALPRAZolam (XANAX) 0.5 MG tablet      TAKE ONE TABLET BY MOUTH THREE TIMES DAILY AS NEEDED   90 tablet   3   . aspirin 81 MG tablet   Oral   Take 81 mg by mouth daily.         Marland Kitchen atorvastatin (LIPITOR) 20 MG tablet      TAKE ONE TABLET BY MOUTH ONCE DAILY   30 tablet   11   . budesonide-formoterol (SYMBICORT) 160-4.5 MCG/ACT inhaler   Inhalation   Inhale 2 puffs into the lungs 2 (two) times daily.   1 Inhaler   5   . clopidogrel (PLAVIX) 75 MG tablet      TAKE ONE TABLET BY MOUTH ONCE DAILY   30 tablet   5   . cyanocobalamin (,VITAMIN B-12,) 1000 MCG/ML injection   Intramuscular   Inject 1 mL (1,000 mcg total) into the muscle every 30 (thirty) days.   1 mL   0   . furosemide (LASIX) 20 MG tablet   Oral   Take 20 mg by mouth daily.         . metoprolol succinate (TOPROL-XL) 50 MG 24 hr tablet      TAKE ONE TABLET BY MOUTH ONCE DAILY. TAKE WITH OR  IMMEDIATELY FOLLOWING A MEAL   30 tablet   5   . nitroGLYCERIN (NITROSTAT) 0.4 MG SL tablet   Sublingual   Place 1 tablet (0.4 mg total) under the tongue every 5 (five) minutes as needed for chest pain (max 3  doses in 15 min).   25 tablet   2   . polyethylene glycol powder (GLYCOLAX/MIRALAX) powder   Oral   Take 17 g by mouth 2 (two) times daily as needed.   500 g   1   . tiotropium (SPIRIVA HANDIHALER) 18 MCG inhalation capsule   Inhalation   Place 18 mcg into inhaler and inhale at bedtime.          Marland Kitchen venlafaxine (EFFEXOR) 37.5 MG tablet      TAKE ONE TABLET BY MOUTH TWICE DAILY   60 tablet   12   . bisacodyl (DULCOLAX) 10 MG suppository   Rectal   Place 1 suppository (10 mg total) rectally as needed for moderate constipation.   12 suppository   0   . NON FORMULARY      Oxygen 2 liters as needed         . predniSONE (DELTASONE) 10 MG tablet   Oral   Take 10 mg by mouth every other day.         Marland Kitchen tiZANidine (ZANAFLEX) 2 MG tablet      Take 1 tablet by mouth at bedtime as needed for muscle spasms.   15 tablet   0     Allergies Amoxicillin; Doxycycline; and Sertraline hcl  Family History  Problem Relation Age of Onset  . Stroke Mother   . Heart disease Mother     MI  . Cancer Father     Lung  . COPD Brother   . Cancer Brother     Lung with mets 2011  . Heart disease Brother     CAD  . Heart disease Sister     cirrhosis due heart disease  . Cirrhosis Sister   . Colon cancer Neg Hx   . Breast cancer Neg Hx     Social History History  Substance Use Topics  . Smoking status: Former Smoker -- 0.40 packs/day for 50 years    Types: Cigarettes    Quit date: 02/28/2012  . Smokeless tobacco: Never Used     Comment: 1/4 PPD as of 2012  . Alcohol Use: No    Review of Systems Constitutional: No fever/chills Eyes: No visual changes. ENT: No sore throat. Cardiovascular: Denies chest pain. Respiratory: Denies shortness of  breath. Gastrointestinal: No abdominal pain.  No nausea, no vomiting.  No diarrhea.  No constipation. Genitourinary: Negative for dysuria. Musculoskeletal: + for intermittent back spasms Skin: Negative for rash. Neurological: Negative for headaches, focal weakness or numbness.  10-point ROS otherwise negative.  ____________________________________________   PHYSICAL EXAM:  VITAL SIGNS: ED Triage Vitals  Enc Vitals Group     BP 09/08/14 0119 172/82 mmHg     Pulse Rate 09/08/14 0119 86     Resp 09/08/14 0119 20     Temp 09/08/14 0119 97.6 F (36.4 C)     Temp Source 09/08/14 0119 Oral     SpO2 09/08/14 0119 99 %     Weight 09/08/14 0119 174 lb (78.926 kg)     Height 09/08/14 0119 5\' 7"  (1.702 m)     Head Cir --      Peak Flow --      Pain Score 09/08/14 0121 1     Pain Loc --      Pain Edu? --      Excl. in [place]? --     Constitutional: Alert and oriented. Well appearing and in no acute distress. Eyes: Conjunctivae are normal. PERRL. EOMI.  Head: Atraumatic. Nose: No congestion/rhinnorhea. Mouth/Throat: Mucous membranes are moist.  Oropharynx non-erythematous. Neck: No stridor.  Cardiovascular: Normal rate, regular rhythm. Grossly normal heart sounds.  Good peripheral circulation. Respiratory: Normal respiratory effort.  No retractions. Lungs CTAB. Gastrointestinal: Soft and nontender. No distention. No abdominal bruits. No CVA tenderness. Genitourinary: deferred Musculoskeletal: Multiple areas of ecchymosis in various stages of healing throughout the right and left arm, less than 0.25 cm hemostatic curvilinear very superficial laceration during the knuckles of the first and second finger on the right hand, small hematoma with ecchymosis noted to the left lateral forearm Neurologic:  Normal speech and language. No gross focal neurologic deficits are appreciated. Speech is normal. No gait instability. Skin:  Skin is warm, dry and intact. No rash noted. Psychiatric: Mood and  affect are normal. Speech and behavior are normal.  ____________________________________________   LABS (all labs ordered are listed, but only abnormal results are displayed)  Labs Reviewed  URINALYSIS COMPLETEWITH MICROSCOPIC ([place]) - Abnormal; Notable for the following:    Color, Urine YELLOW (*)    APPearance CLEAR (*)    Hgb urine dipstick 3+ (*)    Leukocytes, UA TRACE (*)    Squamous Epithelial / LPF 0-5 (*)    All other components within normal limits   ____________________________________________  EKG  none ____________________________________________  RADIOLOGY  none ____________________________________________   PROCEDURES  Procedure(s) performed:   LACERATION REPAIR Performed by: Joanne Gavel Authorized by: Loura Pardon A Consent: Verbal consent obtained. Risks and benefits: risks, benefits and alternatives were discussed Consent given by: patient Patient identity confirmed: provided demographic data Prepped and Draped in normal sterile fashion Wound explored  Laceration Location: between 2nd and 3rd knuckle right hand  Laceration Length: 0.25 cm  No Foreign Bodies seen or palpated  Anesthesia: local infiltration  Local anesthetic: none  Anesthetic total: none  Irrigation method: syringe Amount of cleaning: standard  Skin closure: tissue adhesive  Number of sutures: none    Patient tolerance: Patient tolerated the procedure well with no immediate complications.   Critical Care performed: No  ____________________________________________   INITIAL IMPRESSION / ASSESSMENT AND PLAN / ED COURSE  Pertinent labs & imaging results that were available during my care of the patient were reviewed by me and considered in my medical decision making (see chart for details).  [facility] is a 74 y.o. female with coronary artery disease on aspirin and Plavix, hypertension, COPD with chronic 2 L home oxygen requirementpresents for  evaluation of bleeding laceration right hand, hematoma and ecchymosis of the left forearm, intermittent back spasms though she is having none currently. She is very well-appearing on exam. Vital signs are stable, she is afebrile. We'll Dermabond the wound on her right hand. I counseled the family on natural history of the hematoma with ecchymosis of the left lateral forearm. She has had back spasms in the past, she is not having any currently. She has no abdominal pain, no chest pain and I doubt acute aortic dissection or aneurysmal rupture. We'll treat symptomatically with Tylenol. Return precautions discussed. DC home. Tetanus up to date. ____________________________________________   FINAL CLINICAL IMPRESSION(S) / ED DIAGNOSES  Final diagnoses:  Hematoma  Ecchymosis  Laceration  Back muscle spasm      Joanne Gavel, MD 09/08/14 [id number]

## 2014-09-08 NOTE — ED Notes (Signed)
Pt to ED with husband c/o lower back pain, also presents with hematoma to L forearm and laceration to R hand between 2nd and 3rd proximal knuckles.

## 2014-09-11 ENCOUNTER — Ambulatory Visit (INDEPENDENT_AMBULATORY_CARE_PROVIDER_SITE_OTHER): Payer: Medicare Other

## 2014-09-11 DIAGNOSIS — E538 Deficiency of other specified B group vitamins: Secondary | ICD-10-CM

## 2014-09-11 MED ORDER — CYANOCOBALAMIN 1000 MCG/ML IJ SOLN
1000.0000 ug | Freq: Once | INTRAMUSCULAR | Status: AC
  Administered 2014-09-11: 1000 ug via INTRAMUSCULAR

## 2014-09-14 DIAGNOSIS — G4733 Obstructive sleep apnea (adult) (pediatric): Secondary | ICD-10-CM | POA: Diagnosis not present

## 2014-09-14 DIAGNOSIS — J449 Chronic obstructive pulmonary disease, unspecified: Secondary | ICD-10-CM | POA: Diagnosis not present

## 2014-09-14 DIAGNOSIS — I509 Heart failure, unspecified: Secondary | ICD-10-CM | POA: Diagnosis not present

## 2014-09-23 DIAGNOSIS — G4733 Obstructive sleep apnea (adult) (pediatric): Secondary | ICD-10-CM | POA: Diagnosis not present

## 2014-09-23 DIAGNOSIS — I509 Heart failure, unspecified: Secondary | ICD-10-CM | POA: Diagnosis not present

## 2014-09-23 DIAGNOSIS — J449 Chronic obstructive pulmonary disease, unspecified: Secondary | ICD-10-CM | POA: Diagnosis not present

## 2014-10-04 DIAGNOSIS — I493 Ventricular premature depolarization: Secondary | ICD-10-CM | POA: Diagnosis not present

## 2014-10-04 DIAGNOSIS — R079 Chest pain, unspecified: Secondary | ICD-10-CM | POA: Diagnosis not present

## 2014-10-04 DIAGNOSIS — I2510 Atherosclerotic heart disease of native coronary artery without angina pectoris: Secondary | ICD-10-CM | POA: Diagnosis not present

## 2014-10-04 DIAGNOSIS — I10 Essential (primary) hypertension: Secondary | ICD-10-CM | POA: Diagnosis not present

## 2014-10-04 DIAGNOSIS — R002 Palpitations: Secondary | ICD-10-CM | POA: Diagnosis not present

## 2014-10-04 DIAGNOSIS — I214 Non-ST elevation (NSTEMI) myocardial infarction: Secondary | ICD-10-CM | POA: Diagnosis not present

## 2014-10-06 ENCOUNTER — Other Ambulatory Visit: Payer: Self-pay | Admitting: Family Medicine

## 2014-10-11 ENCOUNTER — Ambulatory Visit (INDEPENDENT_AMBULATORY_CARE_PROVIDER_SITE_OTHER): Payer: Medicare Other

## 2014-10-11 DIAGNOSIS — E538 Deficiency of other specified B group vitamins: Secondary | ICD-10-CM | POA: Diagnosis not present

## 2014-10-11 MED ORDER — CYANOCOBALAMIN 1000 MCG/ML IJ SOLN
1000.0000 ug | Freq: Once | INTRAMUSCULAR | Status: AC
  Administered 2014-10-11: 1000 ug via INTRAMUSCULAR

## 2014-10-14 DIAGNOSIS — I509 Heart failure, unspecified: Secondary | ICD-10-CM | POA: Diagnosis not present

## 2014-10-14 DIAGNOSIS — G4733 Obstructive sleep apnea (adult) (pediatric): Secondary | ICD-10-CM | POA: Diagnosis not present

## 2014-10-14 DIAGNOSIS — J449 Chronic obstructive pulmonary disease, unspecified: Secondary | ICD-10-CM | POA: Diagnosis not present

## 2014-10-15 DIAGNOSIS — H2513 Age-related nuclear cataract, bilateral: Secondary | ICD-10-CM | POA: Diagnosis not present

## 2014-10-16 DIAGNOSIS — R002 Palpitations: Secondary | ICD-10-CM | POA: Diagnosis not present

## 2014-10-19 DIAGNOSIS — I10 Essential (primary) hypertension: Secondary | ICD-10-CM | POA: Diagnosis not present

## 2014-10-19 DIAGNOSIS — H2513 Age-related nuclear cataract, bilateral: Secondary | ICD-10-CM | POA: Diagnosis not present

## 2014-10-22 DIAGNOSIS — I493 Ventricular premature depolarization: Secondary | ICD-10-CM | POA: Diagnosis not present

## 2014-10-22 DIAGNOSIS — R079 Chest pain, unspecified: Secondary | ICD-10-CM | POA: Diagnosis not present

## 2014-10-22 DIAGNOSIS — I214 Non-ST elevation (NSTEMI) myocardial infarction: Secondary | ICD-10-CM | POA: Diagnosis not present

## 2014-10-22 DIAGNOSIS — I2510 Atherosclerotic heart disease of native coronary artery without angina pectoris: Secondary | ICD-10-CM | POA: Diagnosis not present

## 2014-10-23 DIAGNOSIS — G4733 Obstructive sleep apnea (adult) (pediatric): Secondary | ICD-10-CM | POA: Diagnosis not present

## 2014-10-23 DIAGNOSIS — I509 Heart failure, unspecified: Secondary | ICD-10-CM | POA: Diagnosis not present

## 2014-10-23 DIAGNOSIS — J449 Chronic obstructive pulmonary disease, unspecified: Secondary | ICD-10-CM | POA: Diagnosis not present

## 2014-11-04 IMAGING — CR DG CHEST 2V
1 series · 2 of 2 positions shown · non-contrast
Comparison: Prior radiograph from 05/27/2013

CLINICAL DATA: Shortness of breath, neck pain

EXAM:
CHEST  2 VIEW

[Series 1: w chest pa · 0.14mm/px · 2 of 2 slices shown]
[im 1/2]
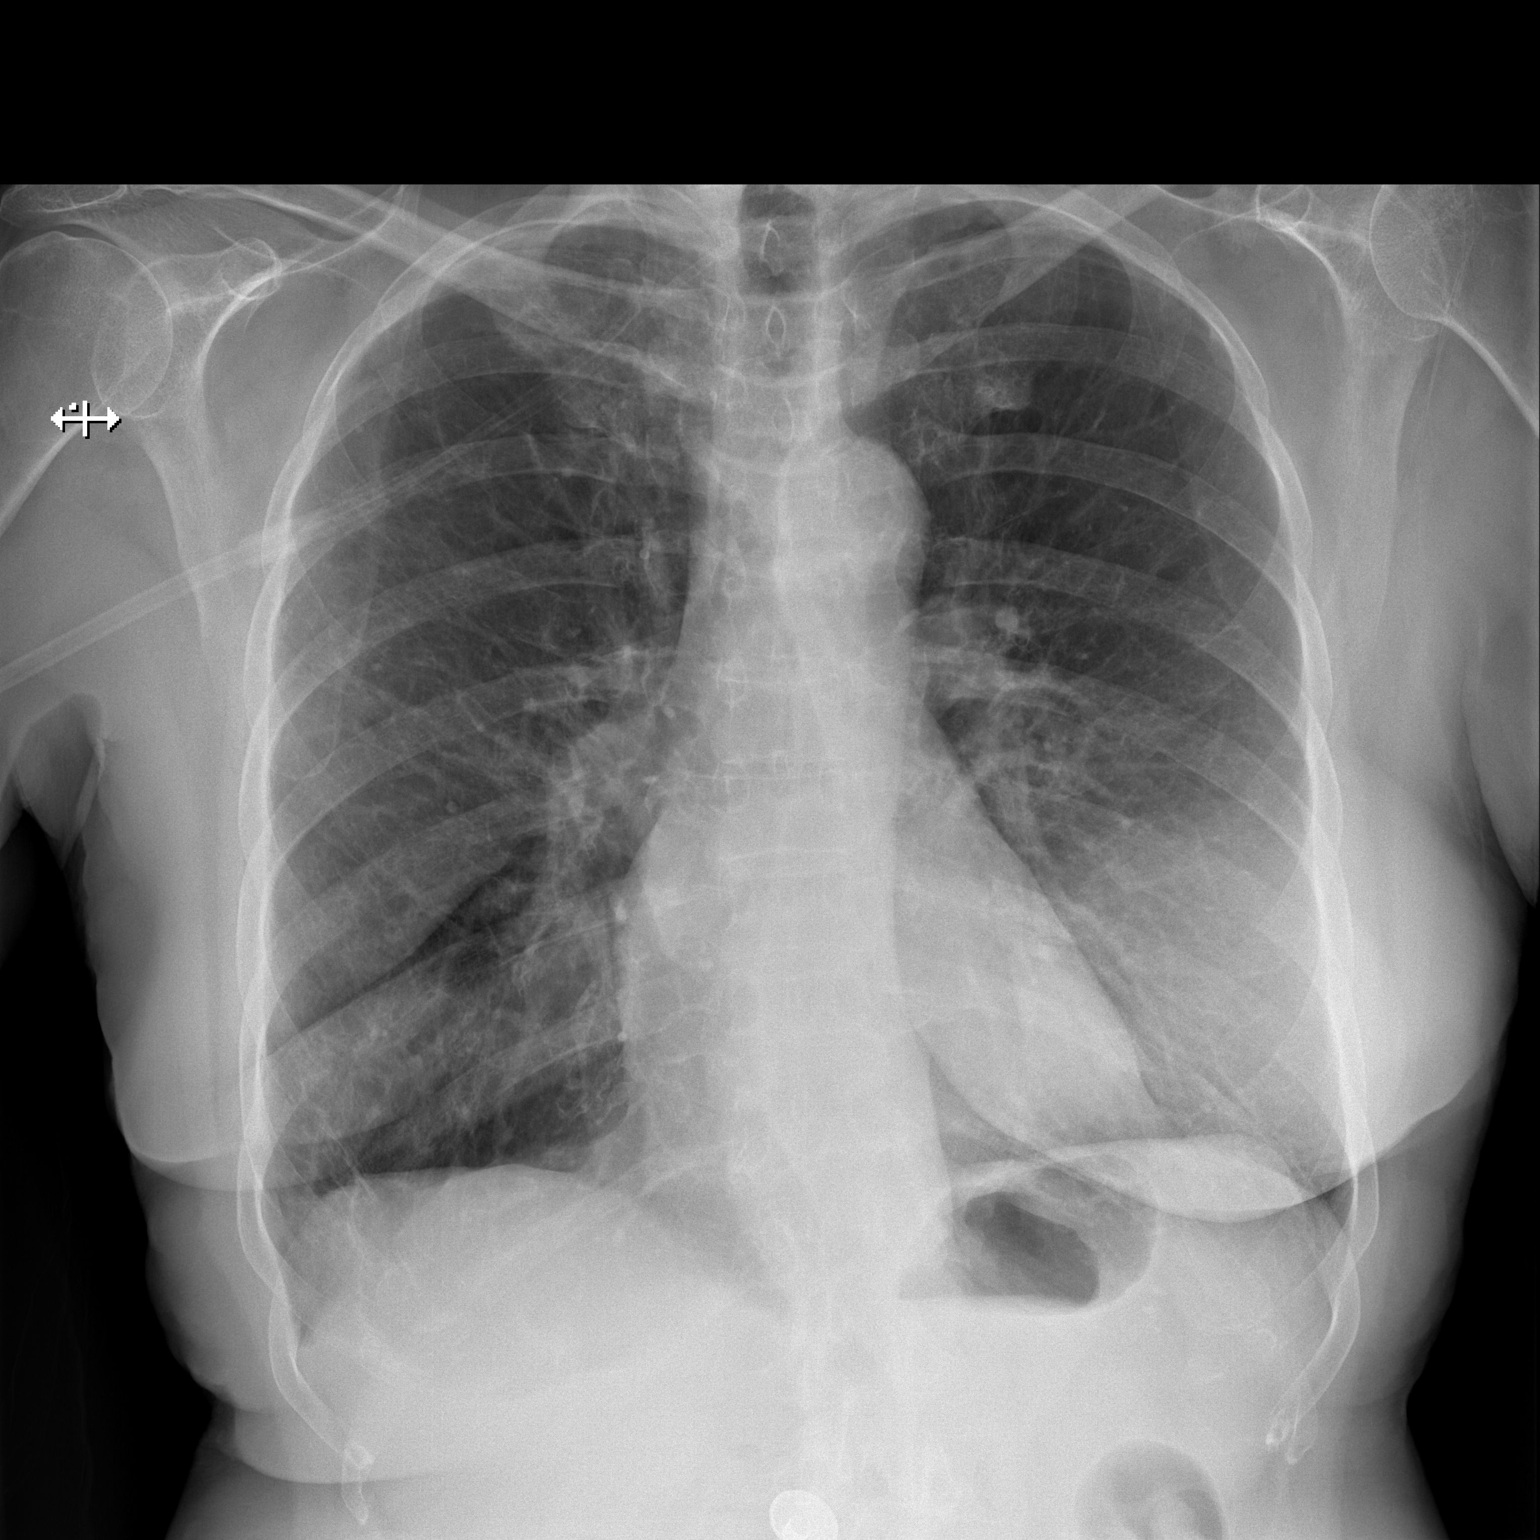
[im 2/2]
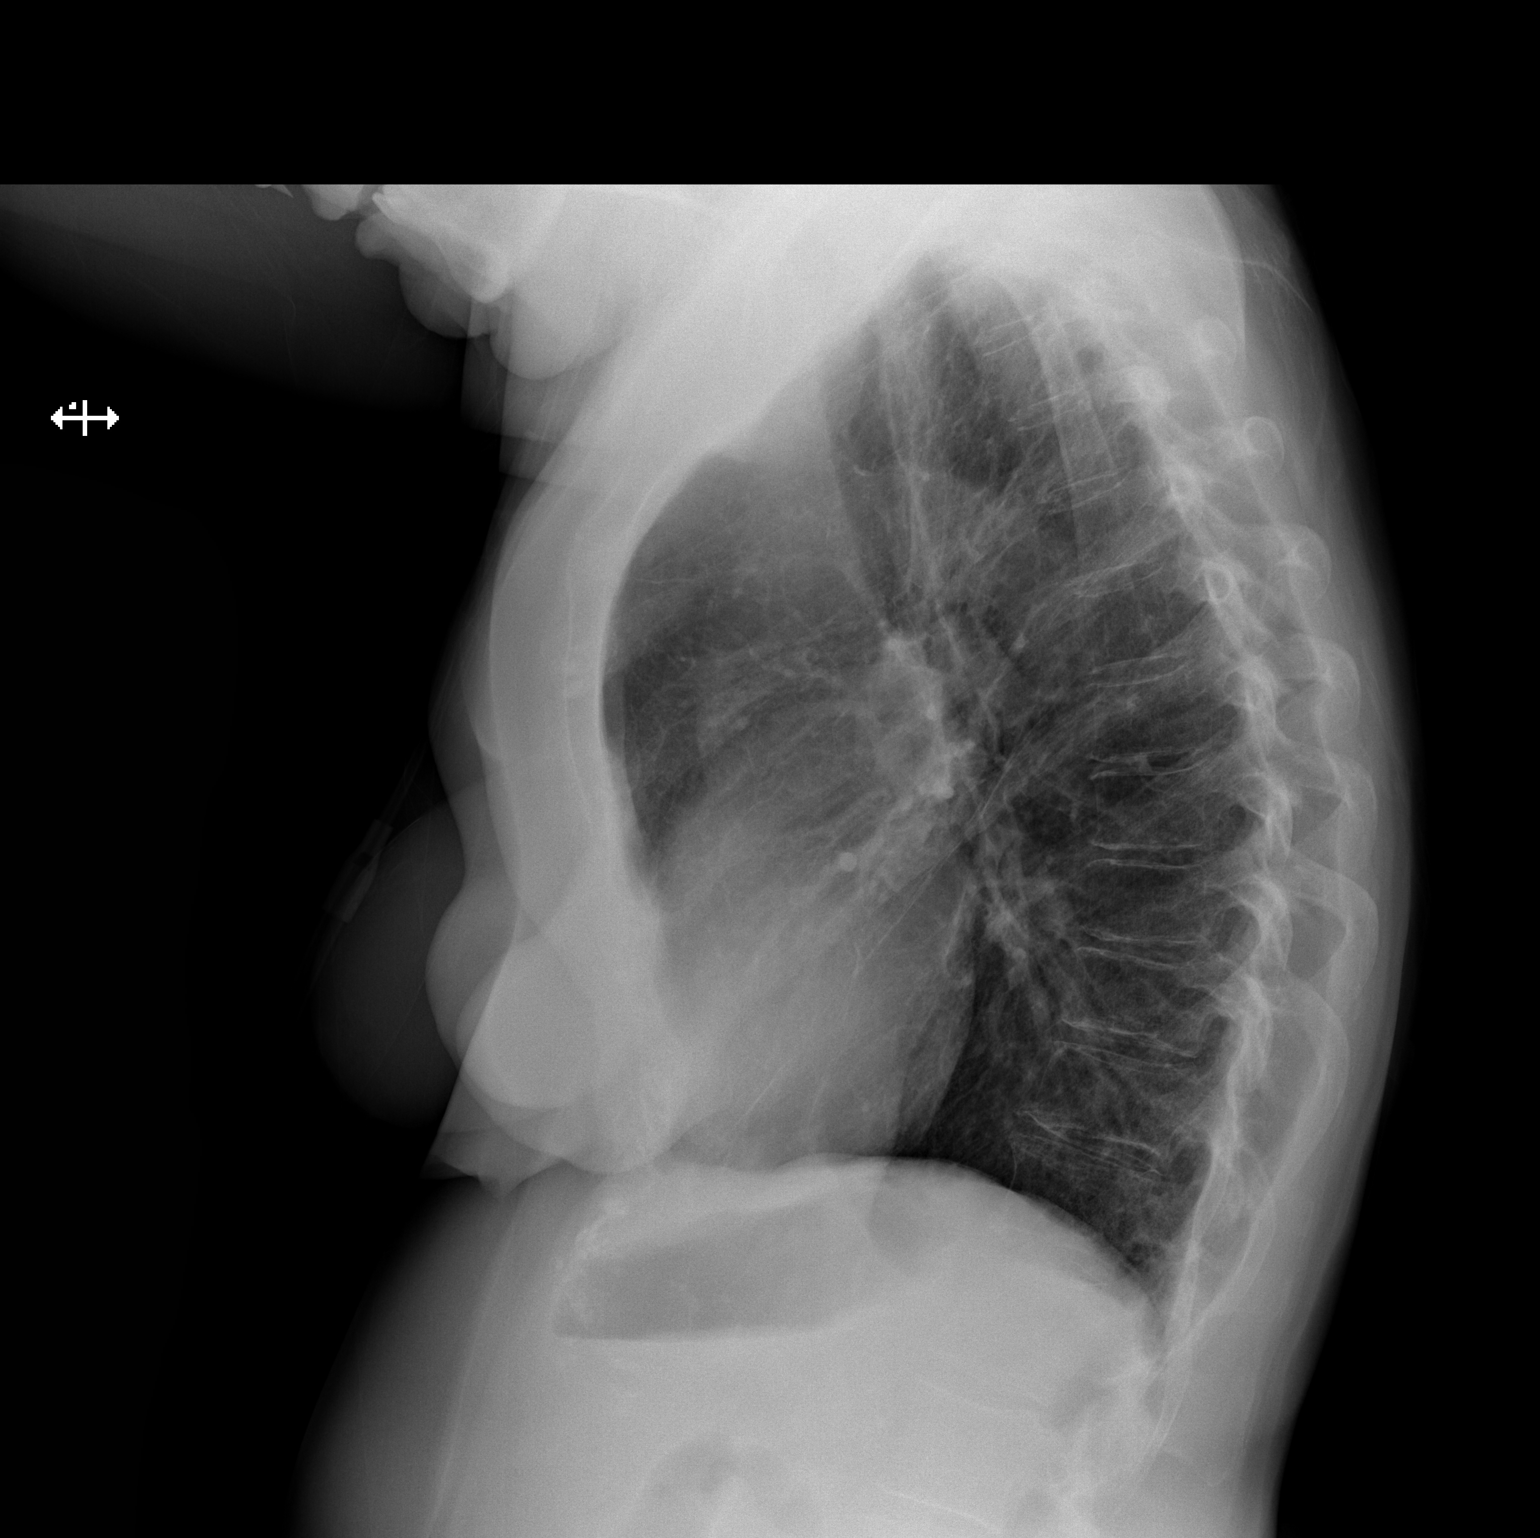

[2 of 2 positions shown; findings below may reference images not displayed]

FINDINGS: The cardiac and mediastinal silhouettes are stable in size and
contour, and remain within normal limits.

Changes compatible with COPD again noted. No airspace consolidation,
pleural effusion, or pulmonary edema is identified. There is no
pneumothorax.

No acute osseous abnormality identified.
IMPRESSION: COPD.  No acute cardiopulmonary abnormality.

## 2014-11-12 ENCOUNTER — Emergency Department: Payer: Medicare Other

## 2014-11-12 ENCOUNTER — Emergency Department
Admission: EM | Admit: 2014-11-12 | Discharge: 2014-11-13 | Disposition: A | Discharge status: Home / Self Care | Payer: Medicare Other | Attending: Emergency Medicine | Admitting: Emergency Medicine

## 2014-11-12 ENCOUNTER — Other Ambulatory Visit: Payer: Self-pay

## 2014-11-12 ENCOUNTER — Encounter: Payer: Self-pay | Admitting: Emergency Medicine

## 2014-11-12 DIAGNOSIS — R10811 Right upper quadrant abdominal tenderness: Secondary | ICD-10-CM | POA: Insufficient documentation

## 2014-11-12 DIAGNOSIS — Z7952 Long term (current) use of systemic steroids: Secondary | ICD-10-CM | POA: Diagnosis not present

## 2014-11-12 DIAGNOSIS — Z7982 Long term (current) use of aspirin: Secondary | ICD-10-CM | POA: Insufficient documentation

## 2014-11-12 DIAGNOSIS — Z87891 Personal history of nicotine dependence: Secondary | ICD-10-CM | POA: Diagnosis not present

## 2014-11-12 DIAGNOSIS — Z7902 Long term (current) use of antithrombotics/antiplatelets: Secondary | ICD-10-CM | POA: Diagnosis not present

## 2014-11-12 DIAGNOSIS — Z7951 Long term (current) use of inhaled steroids: Secondary | ICD-10-CM | POA: Insufficient documentation

## 2014-11-12 DIAGNOSIS — I2510 Atherosclerotic heart disease of native coronary artery without angina pectoris: Secondary | ICD-10-CM | POA: Diagnosis not present

## 2014-11-12 DIAGNOSIS — Z79899 Other long term (current) drug therapy: Secondary | ICD-10-CM | POA: Diagnosis not present

## 2014-11-12 DIAGNOSIS — M8448XA Pathological fracture, other site, initial encounter for fracture: Secondary | ICD-10-CM | POA: Diagnosis not present

## 2014-11-12 DIAGNOSIS — M4854XA Collapsed vertebra, not elsewhere classified, thoracic region, initial encounter for fracture: Secondary | ICD-10-CM

## 2014-11-12 DIAGNOSIS — J441 Chronic obstructive pulmonary disease with (acute) exacerbation: Secondary | ICD-10-CM | POA: Diagnosis not present

## 2014-11-12 DIAGNOSIS — S22060A Wedge compression fracture of T7-T8 vertebra, initial encounter for closed fracture: Secondary | ICD-10-CM | POA: Diagnosis not present

## 2014-11-12 DIAGNOSIS — I10 Essential (primary) hypertension: Secondary | ICD-10-CM | POA: Diagnosis not present

## 2014-11-12 DIAGNOSIS — Z880 Allergy status to penicillin: Secondary | ICD-10-CM | POA: Diagnosis not present

## 2014-11-12 DIAGNOSIS — I700 Atherosclerosis of aorta: Secondary | ICD-10-CM | POA: Diagnosis not present

## 2014-11-12 DIAGNOSIS — N281 Cyst of kidney, acquired: Secondary | ICD-10-CM | POA: Diagnosis not present

## 2014-11-12 DIAGNOSIS — M542 Cervicalgia: Secondary | ICD-10-CM | POA: Diagnosis present

## 2014-11-12 DIAGNOSIS — R1011 Right upper quadrant pain: Secondary | ICD-10-CM | POA: Diagnosis not present

## 2014-11-12 DIAGNOSIS — R079 Chest pain, unspecified: Secondary | ICD-10-CM | POA: Diagnosis not present

## 2014-11-12 DIAGNOSIS — R109 Unspecified abdominal pain: Secondary | ICD-10-CM

## 2014-11-12 DIAGNOSIS — J984 Other disorders of lung: Secondary | ICD-10-CM | POA: Diagnosis not present

## 2014-11-12 HISTORY — DX: Personal history of (corrected) congenital malformations of heart and circulatory system: Z87.74

## 2014-11-12 HISTORY — DX: Presence of other vascular implants and grafts: Z95.828

## 2014-11-12 LAB — BASIC METABOLIC PANEL
Anion gap: 9 (ref 5–15)
BUN: 15 mg/dL (ref 6–20)
CHLORIDE: 99 mmol/L — AB (ref 101–111)
CO2: 32 mmol/L (ref 22–32)
Calcium: 9.8 mg/dL (ref 8.9–10.3)
Creatinine, Ser: 0.94 mg/dL (ref 0.44–1.00)
GFR calc non Af Amer: 60 mL/min — ABNORMAL LOW (ref >60)
Glucose, Bld: 144 mg/dL — ABNORMAL HIGH (ref 65–99)
Potassium: 4 mmol/L (ref 3.5–5.1)
SODIUM: 140 mmol/L (ref 135–145)

## 2014-11-12 LAB — CBC
HCT: 42.5 % (ref 35.0–47.0)
Hemoglobin: 13.7 g/dL (ref 12.0–16.0)
MCH: 28.3 pg (ref 26.0–34.0)
MCHC: 32.3 g/dL (ref 32.0–36.0)
MCV: 87.6 fL (ref 80.0–100.0)
Platelets: 239 10*3/uL (ref 150–440)
RBC: 4.84 MIL/uL (ref 3.80–5.20)
RDW: 13.9 % (ref 11.5–14.5)
WBC: 10.4 10*3/uL (ref 3.6–11.0)

## 2014-11-12 LAB — URINALYSIS COMPLETE WITH MICROSCOPIC (ARMC ONLY)
BILIRUBIN URINE: NEGATIVE
Bacteria, UA: NONE SEEN
GLUCOSE, UA: NEGATIVE mg/dL
KETONES UR: NEGATIVE mg/dL
LEUKOCYTES UA: NEGATIVE
NITRITE: NEGATIVE
Protein, ur: NEGATIVE mg/dL
SQUAMOUS EPITHELIAL / LPF: NONE SEEN
pH: 5 (ref 5.0–8.0)

## 2014-11-12 LAB — HEPATIC FUNCTION PANEL
ALBUMIN: 3.9 g/dL (ref 3.5–5.0)
ALT: 22 U/L (ref 14–54)
AST: 34 U/L (ref 15–41)
Alkaline Phosphatase: 81 U/L (ref 38–126)
Bilirubin, Direct: <0.1 mg/dL — ABNORMAL LOW (ref 0.1–0.5)
TOTAL PROTEIN: 7 g/dL (ref 6.5–8.1)
Total Bilirubin: 0.5 mg/dL (ref 0.3–1.2)

## 2014-11-12 LAB — TROPONIN I

## 2014-11-12 LAB — LIPASE, BLOOD: LIPASE: 37 U/L (ref 22–51)

## 2014-11-12 LAB — BRAIN NATRIURETIC PEPTIDE: B Natriuretic Peptide: 147 pg/mL — ABNORMAL HIGH (ref 0.0–100.0)

## 2014-11-12 MED ORDER — ONDANSETRON HCL 4 MG/2ML IJ SOLN
4.0000 mg | Freq: Once | INTRAMUSCULAR | Status: AC
  Administered 2014-11-12: 4 mg via INTRAVENOUS
  Filled 2014-11-12: qty 2

## 2014-11-12 MED ORDER — METOCLOPRAMIDE HCL 5 MG/ML IJ SOLN
10.0000 mg | Freq: Once | INTRAMUSCULAR | Status: AC
  Administered 2014-11-12: 10 mg via INTRAVENOUS
  Filled 2014-11-12: qty 2

## 2014-11-12 MED ORDER — KETOROLAC TROMETHAMINE 30 MG/ML IJ SOLN
30.0000 mg | Freq: Once | INTRAMUSCULAR | Status: AC
  Administered 2014-11-12: 30 mg via INTRAVENOUS

## 2014-11-12 MED ORDER — IPRATROPIUM-ALBUTEROL 0.5-2.5 (3) MG/3ML IN SOLN
3.0000 mL | Freq: Once | RESPIRATORY_TRACT | Status: AC
  Administered 2014-11-12: 3 mL via RESPIRATORY_TRACT
  Filled 2014-11-12: qty 3

## 2014-11-12 MED ORDER — IOHEXOL 350 MG/ML SOLN
100.0000 mL | Freq: Once | INTRAVENOUS | Status: AC | PRN
  Administered 2014-11-12: 100 mL via INTRAVENOUS

## 2014-11-12 MED ORDER — SODIUM CHLORIDE 0.9 % IV BOLUS (SEPSIS)
1000.0000 mL | Freq: Once | INTRAVENOUS | Status: AC
  Administered 2014-11-12: 1000 mL via INTRAVENOUS

## 2014-11-12 MED ORDER — MORPHINE SULFATE (PF) 4 MG/ML IV SOLN
4.0000 mg | Freq: Once | INTRAVENOUS | Status: AC
  Administered 2014-11-12: 4 mg via INTRAVENOUS
  Filled 2014-11-12: qty 1

## 2014-11-12 MED ORDER — ONDANSETRON HCL 4 MG/2ML IJ SOLN
4.0000 mg | Freq: Once | INTRAMUSCULAR | Status: AC
  Administered 2014-11-12: 4 mg via INTRAVENOUS

## 2014-11-12 NOTE — ED Notes (Signed)
Pt reports improvement in nausea. No needs identified at this time

## 2014-11-12 NOTE — ED Notes (Signed)
Pt reports right arm pain that radiates up into neck and back since 9am this morning, pt reports some shortness of breath, reports she sometimes wears oxygen.

## 2014-11-12 NOTE — ED Provider Notes (Signed)
[facility] Emergency Department Provider Note ____________________________________________  Time seen: Approximately 3:17 PM  I have reviewed the triage vital signs and the nursing notes.   HISTORY  Chief Complaint: R arm & neck pain   HPI Mckenzie Mclaughlin is a 74 y.o. female with a history of COPD who has been somewhat more short of breath than baseline with increased cough for the past few months active of a small amount of white sputum and blood tinged for the past 1-2 weeks who presents with right arm neck, shoulder, right sided back pain since 9:30 AM. She denies any chest pain. The pain is worse when she moves and when she raises her right arm. He denies fevers, vomiting, diarrhea, abdominal pain. She has been tolerating by mouth normally and states she is urinating normally. She uses 2 L of oxygen at home as needed.  She notes she is been more short of breath since she had a lung biopsy with a pneumothorax about 4 years ago. She is followed by Dr. Raul Del for pulmonology and Dr. Damita Dunnings for her primary care.   Past Medical History  Diagnosis Date  . COPD, severe 07/29/98  . Coronary artery disease 01/22/99    Non Q-wave MI, stent RCA  . Hypertension     03/30/00  . History of ETT 08/03/09    Myoview Normal  . History of ETT 05/1999    Cardiolite pos ETT, neg Cardiolite  . Hx of cardiac catheterization 08/29/01    60% RCA 0/w 20-30% lesions  . Hx of cardiac catheterization 01/22/99    w/Stent 90% RCA lesion  . History of ETT 09/02/01    Normal  . History of ETT 04/28/2006    Myoview normal EF 83%  . History of CT scan of head 08/02/09    w/o mild age appropr atrophy  . History of blurry vision 05/06-05/10/2009    Hospital [facility] CP R/O'D Blurry vision, smoking  . History of ETT 04/10/11    normal EF and no ischemia per Dr. Saralyn Pilar  . COPD (chronic obstructive pulmonary disease)   . Anxiety   . Shortness of breath   . Pneumothorax 2/14  .  Esophageal dilatation     Pt needs appple sauce to take meds.  . Esophageal stricture     PT needs apple sauce to take meds  . GERD (gastroesophageal reflux disease) 08/29/98    severe-no meds now  . On home oxygen therapy     as needed-has a travel tank  . Complication of anesthesia   . PONV (postoperative nausea and vomiting)     ad breast remocved and see was under anesthesia a long time  . Anginal pain     see dr [name]  "Spams"  . Dysrhythmia     Palpations at times. last time 10/29 ish 2014  . Mental disorder   . Depression   . Head injury, acute, with loss of consciousness 11/2012    unsure how long  . Constipation   . History of blood transfusion   . Cancer     side of head- skin cancer, squamous cell ca on left foot.  . Status post aortic coarctation stent placement     Patient Active Problem List   Diagnosis Date Noted  . Advance care planning 08/15/2014  . Constipation 06/21/2014  . CAD, multiple vessel 12/17/2013  . NSTEMI (non-ST elevated myocardial infarction) 12/17/2013  . Medicare annual wellness visit, subsequent 07/09/2013  . Abdominal  pain, other specified site 11/23/2012  . Bruising 10/05/2012  . Neck pain 08/31/2012  . Anxiety and depression 07/11/2012  . Esophageal dilatation   . Esophageal stricture   . Trochanteric bursitis of left hip 07/06/2012  . Severe malnutrition 06/22/2012  . COPD with acute exacerbation 06/17/2012  . Lung nodule 04/17/2012  . Edema of foot 04/01/2012  . Paresthesia 10/27/2011  . Right ankle injury 06/09/2011  . Atypical chest pain 05/13/2011  . Breast mass 04/17/2011  . HLD (hyperlipidemia) 02/26/2011  . History of mammogram 02/26/2011  . Osteoporosis screening 02/26/2011  . Vertigo 02/18/2011  . LIPOMA 12/11/2009  . NICOTINE ADDICTION 08/02/2009  . B12 deficiency 01/31/2009  . CONSTIPATION 01/15/2009  . HEADACHE 10/22/2006  . PREMATURE VENTRICULAR CONTRACTIONS, FREQUENT 07/19/2006  . MENOPAUSAL SYNDROME  07/19/2006  . HYPERTENSION 03/30/2000  . CORONARY ARTERY DISEASE 01/22/1999  . GERD 08/29/1998  . COPD, severe 07/29/1998    Past Surgical History  Procedure Laterality Date  . Laminectomy  1976    Disc Removal X 2 Lumbar  . Esophageal dilation  06/00    EGD  . Oophorectomy    . Abdominal hysterectomy    . Colonoscopy    . Mastectomy  03/1976    Bilateral due FCBD with implants [facility])  . Carotid stent Right 2000  . Back surgery    . Tissue expander placement Right 02/03/2013    Procedure: REMOVAL OF RIGHT BREAST IMPLANT AND IMPLANT MATERIAL  CAPSULECTOMY;  Surgeon: Irene Limbo, MD;  Location: [place];  Service: Plastics;  Laterality: Right;  . Breast surgery      implant removal, R breast    Current Outpatient Rx  Name  Route  Sig  Dispense  Refill  . albuterol (PROAIR HFA) 108 (90 BASE) MCG/ACT inhaler   Inhalation   Inhale 2 puffs into the lungs every 6 (six) hours as needed. Patient taking differently: Inhale 2 puffs into the lungs every 6 (six) hours as needed for wheezing or shortness of breath.    18 g   3   . ALPRAZolam (XANAX) 0.5 MG tablet   Oral   Take 0.5 mg by mouth 3 (three) times daily as needed for anxiety.         Marland Kitchen aspirin EC 81 MG tablet   Oral   Take 81 mg by mouth daily.         Marland Kitchen atorvastatin (LIPITOR) 20 MG tablet   Oral   Take 20 mg by mouth daily.         . clopidogrel (PLAVIX) 75 MG tablet   Oral   Take 75 mg by mouth daily.         . cyanocobalamin (,VITAMIN B-12,) 1000 MCG/ML injection   Intramuscular   Inject 1 mL (1,000 mcg total) into the muscle every 30 (thirty) days.   1 mL   0   . furosemide (LASIX) 20 MG tablet   Oral   Take 20 mg by mouth daily.         . metoprolol succinate (TOPROL-XL) 50 MG 24 hr tablet   Oral   Take 50 mg by mouth daily.         . nitroGLYCERIN (NITROSTAT) 0.4 MG SL tablet   Sublingual   Place 1 tablet (0.4 mg total) under the tongue every 5 (five) minutes as needed for chest  pain (max 3 doses in 15 min).   25 tablet   2   . tiotropium (SPIRIVA) 18 MCG inhalation  capsule   Inhalation   Place 18 mcg into inhaler and inhale every evening.         . venlafaxine (EFFEXOR) 37.5 MG tablet   Oral   Take 37.5 mg by mouth 2 (two) times daily.         . bisacodyl (DULCOLAX) 10 MG suppository   Rectal   Place 1 suppository (10 mg total) rectally as needed for moderate constipation.   12 suppository   0   . budesonide-formoterol (SYMBICORT) 160-4.5 MCG/ACT inhaler   Inhalation   Inhale 2 puffs into the lungs 2 (two) times daily.   1 Inhaler   5   . polyethylene glycol powder (GLYCOLAX/MIRALAX) powder   Oral   Take 17 g by mouth 2 (two) times daily as needed.   500 g   1   . predniSONE (DELTASONE) 10 MG tablet   Oral   Take 10 mg by mouth every other day.         . predniSONE (DELTASONE) 10 MG tablet   Oral   Take 10 mg by mouth daily.         Marland Kitchen tiZANidine (ZANAFLEX) 2 MG tablet      Take 1 tablet by mouth at bedtime as needed for muscle spasms.   15 tablet   0     Allergies Amoxicillin; Doxycycline; and Sertraline hcl  Family History  Problem Relation Age of Onset  . Stroke Mother   . Heart disease Mother     MI  . Cancer Father     Lung  . COPD Brother   . Cancer Brother     Lung with mets 2011  . Heart disease Brother     CAD  . Heart disease Sister     cirrhosis due heart disease  . Cirrhosis Sister   . Colon cancer Neg Hx   . Breast cancer Neg Hx     Social History Social History  Substance Use Topics  . Smoking status: Former Smoker -- 0.40 packs/day for 50 years    Types: Cigarettes    Quit date: 02/28/2012  . Smokeless tobacco: Never Used     Comment: 1/4 PPD as of 2012  . Alcohol Use: No    Review of Systems Constitutional: No fever Cardiovascular: Denies chest pain. Respiratory: See history of present illness Gastrointestinal: No abdominal pain.  No nausea, no vomiting.  No diarrhea.    Musculoskeletal: See history of present illness 10-point ROS otherwise negative.  ____________________________________________   PHYSICAL EXAM:  VITAL SIGNS: ED Triage Vitals  Enc Vitals Group     BP 11/12/14 1426 141/125 mmHg     Pulse Rate 11/12/14 1426 84     Resp 11/12/14 1426 24     Temp 11/12/14 1426 98 F (36.7 C)     Temp Source 11/12/14 1426 Oral     SpO2 11/12/14 1426 97 %     Weight 11/12/14 1426 170 lb (77.111 kg)     Height 11/12/14 1426 5\' 7"  (1.702 m)     Head Cir --      Peak Flow --      Pain Score 11/12/14 1428 8     Pain Loc --      Pain Edu? --      Excl. in [place]? --    Constitutional: Alert and oriented. Well appearing; I'll tachypnea with mild lip pursed breathing. Eyes: Conjunctivae are normal. PERRL. EOMI. Head: Atraumatic. Nose: No congestion/rhinnorhea. Mouth/Throat: Mucous membranes are  slightly dry.  Oropharynx non-erythematous. Neck: No stridor.   Lymphatic: No cervical lymphadenopathy. Cardiovascular: Normal rate, regular rhythm. Grossly normal heart sounds.  Peripheral pulses 2+ B Respiratory: Normal respiratory effort.  No retractions. Lungs CTAB. Gastrointestinal: Soft; mildly tender right upper quadrant with no rebound or guarding. No distention. Normal bowel sounds.  Musculoskeletal: No lower extremity tenderness nor edema.  No calf TTP. Neurologic:  Normal speech and language. No gross focal neurologic deficits are appreciated. Speech is normal.  Skin:  Skin is warm, dry and intact. No rash noted. Psychiatric: Mood and affect are normal. Speech and behavior are normal.  ____________________________________________   LABS (all labs ordered are listed, but only abnormal results are displayed)  Labs Reviewed  BASIC METABOLIC PANEL - Abnormal; Notable for the following:    Chloride 99 (*)    Glucose, Bld 144 (*)    GFR calc non Af Amer 60 (*)    All other components within normal limits  BRAIN NATRIURETIC PEPTIDE - Abnormal; Notable  for the following:    B Natriuretic Peptide 147.0 (*)    All other components within normal limits  CBC  TROPONIN I  HEPATIC FUNCTION PANEL  LIPASE, BLOOD  URINALYSIS COMPLETEWITH MICROSCOPIC ([facility] ONLY)   ____________________________________________  EKG   Date: 11/12/2014  Rate: 82  Rhythm: normal sinus rhythm  QRS Axis: normal  Intervals: normal  ST/T Wave abnormalities: normal  Conduction Disutrbances: none  Narrative Interpretation: unremarkable  ____________________________________________  RADIOLOGY  CXR-IMPRESSION: 1. 4 cm mass projecting over the retrosternal fat pad and anterior mediastinum. Follow-up contrast-enhanced chest CT recommended for further assessment. 2. Severe emphysema. 3. RIGHT mastectomy.  CT C/A/P: IMPRESSION: 1. No evidence of a pulmonary embolus. 2. The questionable mass suggested on the current chest radiographs in the retrosternal area on the lateral view, is not defined on CT. There is no lung mass or focal area of consolidation. This opacity may have been due to superimposed structures on the lateral view, including changes from right breast surgery. 3. There are no acute findings in the lungs. Irregular areas of focal opacity noted on the prior chest CT are stable. No convincing neoplastic disease in the chest. Stable changes of emphysema. 4. No findings in the abdomen or pelvis to explain right upper quadrant pain. 5. Probable gallstones but no evidence of acute cholecystitis. 6. Normal appendix visualized. 7. Musculoskeletal evaluation demonstrates a mild compression fracture of T8 which is new from the prior chest CT. This could be the source of back pain or radiating pain. No other fractures.  U/S abd: IMPRESSION: Normal right upper quadrant ultrasound.  (gallstone seen on CT NOT present)  ____________________________________________   PROCEDURES  Procedure(s) performed: none  Critical Care performed:  none ____________________________________________   INITIAL IMPRESSION / ASSESSMENT AND PLAN / ED COURSE  Pertinent labs & imaging results that were available during my care of the patient were reviewed by me and considered in my medical decision making (see chart for details).  Will treat with morphine and Zofran for pain and give a small amount of IV fluids while further evaluating patient. ----------------------------------------- 4:23 PM on 11/12/2014 -----------------------------------------  Discussed with patient and spouse need for further imaging to evaluate abnormality seen on chest x-ray.  ----------------------------------------- 7:01 PM on 11/12/2014 -----------------------------------------  Patient and spouse updated on CT results. Patient denies any recent fall. Does have some minimal pain over the lower thoracic area.  She needs to have pain in her right arm. Also complaining of nausea. Will treat  pain and nausea and reassess.  ----------------------------------------- 8:00 PM on 11/12/2014 -----------------------------------------  Nausea has improved. Abdomen soft, nontender, nondistended. Normal work of breathing. Breath sounds still coarse bilaterally. Patient feels somewhat lightheaded. She would like to try to eat.  ----------------------------------------- 12:22 AM on 11/13/2014 -----------------------------------------  She is tolerating by mouth. Abdomen no longer hurts. Patient will follow up with her primary care this week. She prefers to take Tylenol for her pain. ____________________________________________   FINAL CLINICAL IMPRESSION(S) / ED DIAGNOSES  Final diagnoses:  Abdominal pain  T8 compression fracture emesis     Ponciano Ort, MD 11/13/14 0022

## 2014-11-13 ENCOUNTER — Ambulatory Visit: Payer: Medicare Other

## 2014-11-13 MED ORDER — ONDANSETRON 4 MG PO TBDP: 4.0000 mg | ORAL_TABLET | Freq: Three times a day (TID) | ORAL | Status: DC | PRN

## 2014-11-13 NOTE — Discharge Instructions (Signed)
It is very important to follow-up with her primary care doctor this week. Return to the ER for new or worsening pain, difficulty breathing, chest pain, fever or for any other concerns.   Vertebral Fracture A vertebral fracture is when one or more bones in the spine are broken (fractured). These bones are called vertebra. You may have pain and be stiff for 3 to 6 weeks.  HOME CARE   Rest in bed for a few days.  Take pain medicine as told by your doctor.  Slowly return to activity as told by your doctor.  Ask your doctor if any physical therapy is needed. GET HELP RIGHT AWAY IF:   Your pain gets worse.  You throw up (vomit).  You cannot move around at all.  You have numbness, tingling, weakness, or cannot move any part of your body.  You cannot control your poop (bowel movements) or pee (urination).  You have chest or belly (abdominal) pain, coughing, or trouble breathing.  You have a temperature by mouth above 102 F (38.9 C), not controlled by medicine. MAKE SURE YOU:   Understand these instructions.  Will watch your condition.  Will get help right away if you are not doing well or get worse. Document Released: 09/03/2009 Document Revised: 01/04/2013 Document Reviewed: 09/03/2009 [facility] Patient Information 2015 [place], [place]. This information is not intended to replace advice given to you by your health care provider. Make sure you discuss any questions you have with your health care provider.  Abdominal Pain Many things can cause belly (abdominal) pain. Most times, the belly pain is not dangerous. Many cases of belly pain can be watched and treated at home. HOME CARE   Do not take medicines that help you go poop (laxatives) unless told to by your doctor.  Only take medicine as told by your doctor.  Eat or drink as told by your doctor. Your doctor will tell you if you should be on a special diet. GET HELP IF:  You do not know what is causing your belly  pain.  You have belly pain while you are sick to your stomach (nauseous) or have runny poop (diarrhea).  You have pain while you pee or poop.  Your belly pain wakes you up at night.  You have belly pain that gets worse or better when you eat.  You have belly pain that gets worse when you eat fatty foods.  You have a fever. GET HELP RIGHT AWAY IF:   The pain does not go away within 2 hours.  You keep throwing up (vomiting).  The pain changes and is only in the right or left part of the belly.  You have bloody or tarry looking poop. MAKE SURE YOU:   Understand these instructions.  Will watch your condition.  Will get help right away if you are not doing well or get worse. Document Released: 09/02/2007 Document Revised: 03/21/2013 Document Reviewed: 11/23/2012 [facility] Patient Information 2015 [place], [place]. This information is not intended to replace advice given to you by your health care provider. Make sure you discuss any questions you have with your health care provider.

## 2014-11-14 DIAGNOSIS — J449 Chronic obstructive pulmonary disease, unspecified: Secondary | ICD-10-CM | POA: Diagnosis not present

## 2014-11-14 DIAGNOSIS — I509 Heart failure, unspecified: Secondary | ICD-10-CM | POA: Diagnosis not present

## 2014-11-14 DIAGNOSIS — G4733 Obstructive sleep apnea (adult) (pediatric): Secondary | ICD-10-CM | POA: Diagnosis not present

## 2014-11-15 ENCOUNTER — Ambulatory Visit (INDEPENDENT_AMBULATORY_CARE_PROVIDER_SITE_OTHER): Payer: Medicare Other | Admitting: Family Medicine

## 2014-11-15 ENCOUNTER — Encounter: Payer: Self-pay | Admitting: Family Medicine

## 2014-11-15 ENCOUNTER — Ambulatory Visit: Payer: Medicare Other

## 2014-11-15 VITALS — BP 142/88 | HR 84 | Temp 97.5°F | Wt 173.5 lb

## 2014-11-15 DIAGNOSIS — M79641 Pain in right hand: Secondary | ICD-10-CM

## 2014-11-15 DIAGNOSIS — D692 Other nonthrombocytopenic purpura: Secondary | ICD-10-CM | POA: Diagnosis not present

## 2014-11-15 DIAGNOSIS — H5702 Anisocoria: Secondary | ICD-10-CM | POA: Diagnosis not present

## 2014-11-15 DIAGNOSIS — E538 Deficiency of other specified B group vitamins: Secondary | ICD-10-CM

## 2014-11-15 DIAGNOSIS — M810 Age-related osteoporosis without current pathological fracture: Secondary | ICD-10-CM

## 2014-11-15 MED ORDER — CYANOCOBALAMIN 1000 MCG/ML IJ SOLN
1000.0000 ug | Freq: Once | INTRAMUSCULAR | Status: AC
  Administered 2014-11-15: 1000 ug via INTRAMUSCULAR

## 2014-11-15 NOTE — Patient Instructions (Signed)
Take up to 2 tylenol at a time, up to 3 times a day.  Use a heating pad on your back for the muscle pain.  Get an over the counter wrist brace to help with the hand pain (likely carpal tunnel syndrome) Let me know if you need to stop the venlafaxine.  You have a compression fracture in your back.  That isn't likely causing the upper back pain or arm pain but it means your bones are likely thinner than normal.  I would consider treatment for that.  Let me know if you want to go for a bone density test.  Take care.  Glad to see you.

## 2014-11-15 NOTE — Progress Notes (Signed)
Pre visit review using our clinic review tool, if applicable. No additional management support is needed unless otherwise documented below in the visit note.  She had some R arm back and and neck pain.  Went to ER.  Some better in the meantime, but still in pain. ER notes reviewed.  Taking tylenol for pain, usually 325mg  at a time.  R arm pain continues with R hand/fingers numb.  No weakness.  Imaging noted at ER.  CXR with possible mass, CT w/o mass noted.  U/s noted NO gallstones.  D/w pt.  T8 compression fx noted.  D/w pt.    She was noted by eye MD to have abnormal pupil dilation, likely related to venlafaxine, with R pupil larger then L pupil at baseline.  Discussed with patient.  She'll ask eye clinic- this is not a contraindication as far as I know and if she needs to come off the medicine per eye MD rec, then she'll let me know.   Meds, vitals, and allergies reviewed.   ROS: See HPI.  Otherwise, noncontributory.  nad Elderly female on O2 Mmm Neck supple Ctab, global dec in BS at baseline, no wheeze, no inc in wob abd soft Ext w/o edema R trap ttp but neck not ttp o/w R arm with normal grip and sensation grossly intact but R tinel clearly positive for R hand sx.   Senile purpura noted.   R pupil slightly larger than L pupil.

## 2014-11-16 ENCOUNTER — Other Ambulatory Visit: Payer: Self-pay | Admitting: Family Medicine

## 2014-11-16 DIAGNOSIS — H5702 Anisocoria: Secondary | ICD-10-CM | POA: Insufficient documentation

## 2014-11-16 DIAGNOSIS — M810 Age-related osteoporosis without current pathological fracture: Secondary | ICD-10-CM | POA: Insufficient documentation

## 2014-11-16 DIAGNOSIS — D692 Other nonthrombocytopenic purpura: Secondary | ICD-10-CM | POA: Insufficient documentation

## 2014-11-16 DIAGNOSIS — M79641 Pain in right hand: Secondary | ICD-10-CM | POA: Insufficient documentation

## 2014-11-16 NOTE — Assessment & Plan Note (Signed)
Noted, will observe, no tx.

## 2014-11-16 NOTE — Telephone Encounter (Signed)
Electronic refill request. ? Rx given 11/12/14 at [place].  Please advise.

## 2014-11-16 NOTE — Assessment & Plan Note (Signed)
R>L, likely related to venlafaxine, she'll check with eye clinic but will continue med for now, will update me as needed.

## 2014-11-16 NOTE — Assessment & Plan Note (Signed)
D/w pt.  She'll consider eval and treatment.  D/w pt.

## 2014-11-16 NOTE — Assessment & Plan Note (Signed)
Likely CTS, d/w pt.  Will use OTC cock up brace and update me as needed.  Can inc tylenol. Muscle spasm in the R trap likely incidental, d/w pt about heat and f/u prn.  She agrees. >25 minutes spent in face to face time with patient, >50% spent in counselling or coordination of care.

## 2014-11-18 NOTE — Telephone Encounter (Signed)
Okay to continue. Long standing rx.  Please call in.  Thanks.

## 2014-11-19 ENCOUNTER — Other Ambulatory Visit: Payer: Self-pay | Admitting: Family Medicine

## 2014-11-19 NOTE — Telephone Encounter (Signed)
Medication phoned to pharmacy.  

## 2014-11-21 ENCOUNTER — Telehealth: Payer: Self-pay | Admitting: Family Medicine

## 2014-11-21 ENCOUNTER — Ambulatory Visit (INDEPENDENT_AMBULATORY_CARE_PROVIDER_SITE_OTHER)
Admission: RE | Admit: 2014-11-21 | Discharge: 2014-11-21 | Disposition: A | Discharge status: Home / Self Care | Payer: Medicare Other | Source: Ambulatory Visit | Attending: Family Medicine | Admitting: Family Medicine

## 2014-11-21 ENCOUNTER — Encounter: Payer: Self-pay | Admitting: Family Medicine

## 2014-11-21 ENCOUNTER — Ambulatory Visit (INDEPENDENT_AMBULATORY_CARE_PROVIDER_SITE_OTHER): Payer: Medicare Other | Admitting: Family Medicine

## 2014-11-21 VITALS — BP 160/80 | HR 79 | Temp 97.6°F | Wt 171.8 lb

## 2014-11-21 DIAGNOSIS — M79601 Pain in right arm: Secondary | ICD-10-CM

## 2014-11-21 DIAGNOSIS — M47812 Spondylosis without myelopathy or radiculopathy, cervical region: Secondary | ICD-10-CM | POA: Diagnosis not present

## 2014-11-21 DIAGNOSIS — M5032 Other cervical disc degeneration, mid-cervical region: Secondary | ICD-10-CM | POA: Diagnosis not present

## 2014-11-21 DIAGNOSIS — M792 Neuralgia and neuritis, unspecified: Secondary | ICD-10-CM

## 2014-11-21 MED ORDER — PREDNISONE 10 MG PO TABS: ORAL_TABLET | ORAL | Status: DC

## 2014-11-21 NOTE — Patient Instructions (Signed)
Take prednisone with food.  Go to the lab on the way out.  We'll contact you with your xray report. Update me if not better.  Take care.  Glad to see you.

## 2014-11-21 NOTE — Progress Notes (Signed)
.  Pre visit review using our clinic review tool, if applicable. No additional management support is needed unless otherwise documented below in the visit note.  Still with R hand, arm and shoulder pain.  From the neck downward.  Tried wrist brace and a wrap w/o any help.  No true weakness but pain/numbness noted.   She tried heat and ice on her trap but didn't have any relief.   Nothing seems to help the pain- not a change in position, hot shower, etc.   It does seem to be a radicular pain, down the R arm, on the dorsum of the arm.   No L sided sx.   She can tolerate prednisone.  D/w pt.   Meds, vitals, and allergies reviewed.   ROS: See HPI.  Otherwise, noncontributory.  nad but uncomfortable Elderly woman on O2 ncat Neck supple, no pain with rotation L/R but pain with neck extension.  No LA R arm with pain on int>ext rotation but no arm drop Pain at the lateral epicondyle with resisted pronation and supination but normal ROM o/w at R elbow Grip wnl, no weakness in the hands B Senile bruising noted on BUE

## 2014-11-21 NOTE — Assessment & Plan Note (Signed)
This could be entirely from her neck, but she has sx suggestive of rotator cuff and tennis elbow pathology.  Pred may help with all of them, at least temporarily.  Bracing her wrist didn't help, ie for possible CTS.   At this point, check plain films of C spine and start pred taper with routine cautions.  She agrees.   Still okay for outpatient f/u.  Uncomfortable but no emergent sx, ie no weakness.   >25 minutes spent in face to face time with patient, >50% spent in counselling or coordination of care.

## 2014-11-21 NOTE — Telephone Encounter (Signed)
Error

## 2014-11-23 DIAGNOSIS — G4733 Obstructive sleep apnea (adult) (pediatric): Secondary | ICD-10-CM | POA: Diagnosis not present

## 2014-11-23 DIAGNOSIS — J449 Chronic obstructive pulmonary disease, unspecified: Secondary | ICD-10-CM | POA: Diagnosis not present

## 2014-11-23 DIAGNOSIS — I509 Heart failure, unspecified: Secondary | ICD-10-CM | POA: Diagnosis not present

## 2014-11-30 DIAGNOSIS — R0602 Shortness of breath: Secondary | ICD-10-CM | POA: Diagnosis not present

## 2014-11-30 DIAGNOSIS — J439 Emphysema, unspecified: Secondary | ICD-10-CM | POA: Diagnosis not present

## 2014-11-30 DIAGNOSIS — R0902 Hypoxemia: Secondary | ICD-10-CM | POA: Diagnosis not present

## 2014-12-04 ENCOUNTER — Other Ambulatory Visit: Payer: Self-pay | Admitting: Family Medicine

## 2014-12-04 NOTE — Telephone Encounter (Signed)
Received refill request electronically Last office visit 11/11/14/acute Is it okay to refill?

## 2014-12-04 NOTE — Telephone Encounter (Signed)
Sent. Thanks.   

## 2014-12-11 ENCOUNTER — Other Ambulatory Visit: Payer: Medicare Other

## 2014-12-14 DIAGNOSIS — Z780 Asymptomatic menopausal state: Secondary | ICD-10-CM | POA: Diagnosis not present

## 2014-12-14 DIAGNOSIS — Z13820 Encounter for screening for osteoporosis: Secondary | ICD-10-CM | POA: Diagnosis not present

## 2014-12-15 DIAGNOSIS — J449 Chronic obstructive pulmonary disease, unspecified: Secondary | ICD-10-CM | POA: Diagnosis not present

## 2014-12-15 DIAGNOSIS — I509 Heart failure, unspecified: Secondary | ICD-10-CM | POA: Diagnosis not present

## 2014-12-15 DIAGNOSIS — G4733 Obstructive sleep apnea (adult) (pediatric): Secondary | ICD-10-CM | POA: Diagnosis not present

## 2014-12-18 ENCOUNTER — Ambulatory Visit (INDEPENDENT_AMBULATORY_CARE_PROVIDER_SITE_OTHER): Payer: Medicare Other | Admitting: *Deleted

## 2014-12-18 ENCOUNTER — Other Ambulatory Visit (INDEPENDENT_AMBULATORY_CARE_PROVIDER_SITE_OTHER): Payer: Medicare Other

## 2014-12-18 DIAGNOSIS — E538 Deficiency of other specified B group vitamins: Secondary | ICD-10-CM

## 2014-12-18 DIAGNOSIS — Z23 Encounter for immunization: Secondary | ICD-10-CM | POA: Diagnosis not present

## 2014-12-18 LAB — VITAMIN B12: Vitamin B-12: 324 pg/mL (ref 211–911)

## 2014-12-18 MED ORDER — CYANOCOBALAMIN 1000 MCG/ML IJ SOLN
1000.0000 ug | Freq: Once | INTRAMUSCULAR | Status: AC
  Administered 2014-12-18: 1000 ug via INTRAMUSCULAR

## 2014-12-24 DIAGNOSIS — G4733 Obstructive sleep apnea (adult) (pediatric): Secondary | ICD-10-CM | POA: Diagnosis not present

## 2014-12-24 DIAGNOSIS — H2512 Age-related nuclear cataract, left eye: Secondary | ICD-10-CM | POA: Diagnosis not present

## 2014-12-24 DIAGNOSIS — H25812 Combined forms of age-related cataract, left eye: Secondary | ICD-10-CM | POA: Diagnosis not present

## 2014-12-24 DIAGNOSIS — I509 Heart failure, unspecified: Secondary | ICD-10-CM | POA: Diagnosis not present

## 2014-12-24 DIAGNOSIS — J449 Chronic obstructive pulmonary disease, unspecified: Secondary | ICD-10-CM | POA: Diagnosis not present

## 2014-12-24 DIAGNOSIS — Z961 Presence of intraocular lens: Secondary | ICD-10-CM | POA: Diagnosis not present

## 2014-12-25 DIAGNOSIS — H2511 Age-related nuclear cataract, right eye: Secondary | ICD-10-CM | POA: Diagnosis not present

## 2014-12-25 DIAGNOSIS — H25011 Cortical age-related cataract, right eye: Secondary | ICD-10-CM | POA: Diagnosis not present

## 2014-12-25 DIAGNOSIS — H25041 Posterior subcapsular polar age-related cataract, right eye: Secondary | ICD-10-CM | POA: Diagnosis not present

## 2015-01-01 ENCOUNTER — Other Ambulatory Visit: Payer: Self-pay | Admitting: Family Medicine

## 2015-01-07 DIAGNOSIS — H25811 Combined forms of age-related cataract, right eye: Secondary | ICD-10-CM | POA: Diagnosis not present

## 2015-01-07 DIAGNOSIS — Z961 Presence of intraocular lens: Secondary | ICD-10-CM | POA: Diagnosis not present

## 2015-01-07 DIAGNOSIS — H2511 Age-related nuclear cataract, right eye: Secondary | ICD-10-CM | POA: Diagnosis not present

## 2015-01-14 DIAGNOSIS — I509 Heart failure, unspecified: Secondary | ICD-10-CM | POA: Diagnosis not present

## 2015-01-14 DIAGNOSIS — J449 Chronic obstructive pulmonary disease, unspecified: Secondary | ICD-10-CM | POA: Diagnosis not present

## 2015-01-14 DIAGNOSIS — G4733 Obstructive sleep apnea (adult) (pediatric): Secondary | ICD-10-CM | POA: Diagnosis not present

## 2015-01-15 ENCOUNTER — Other Ambulatory Visit: Payer: Self-pay | Admitting: Family Medicine

## 2015-01-16 IMAGING — CR DG CHEST 2V
2 series · 2 of 2 positions shown · non-contrast
Comparison: 07/24/2013

CLINICAL DATA: Evaluate for pneumonia.

EXAM:
CHEST  2 VIEW

[view not recorded (1 of 2)]
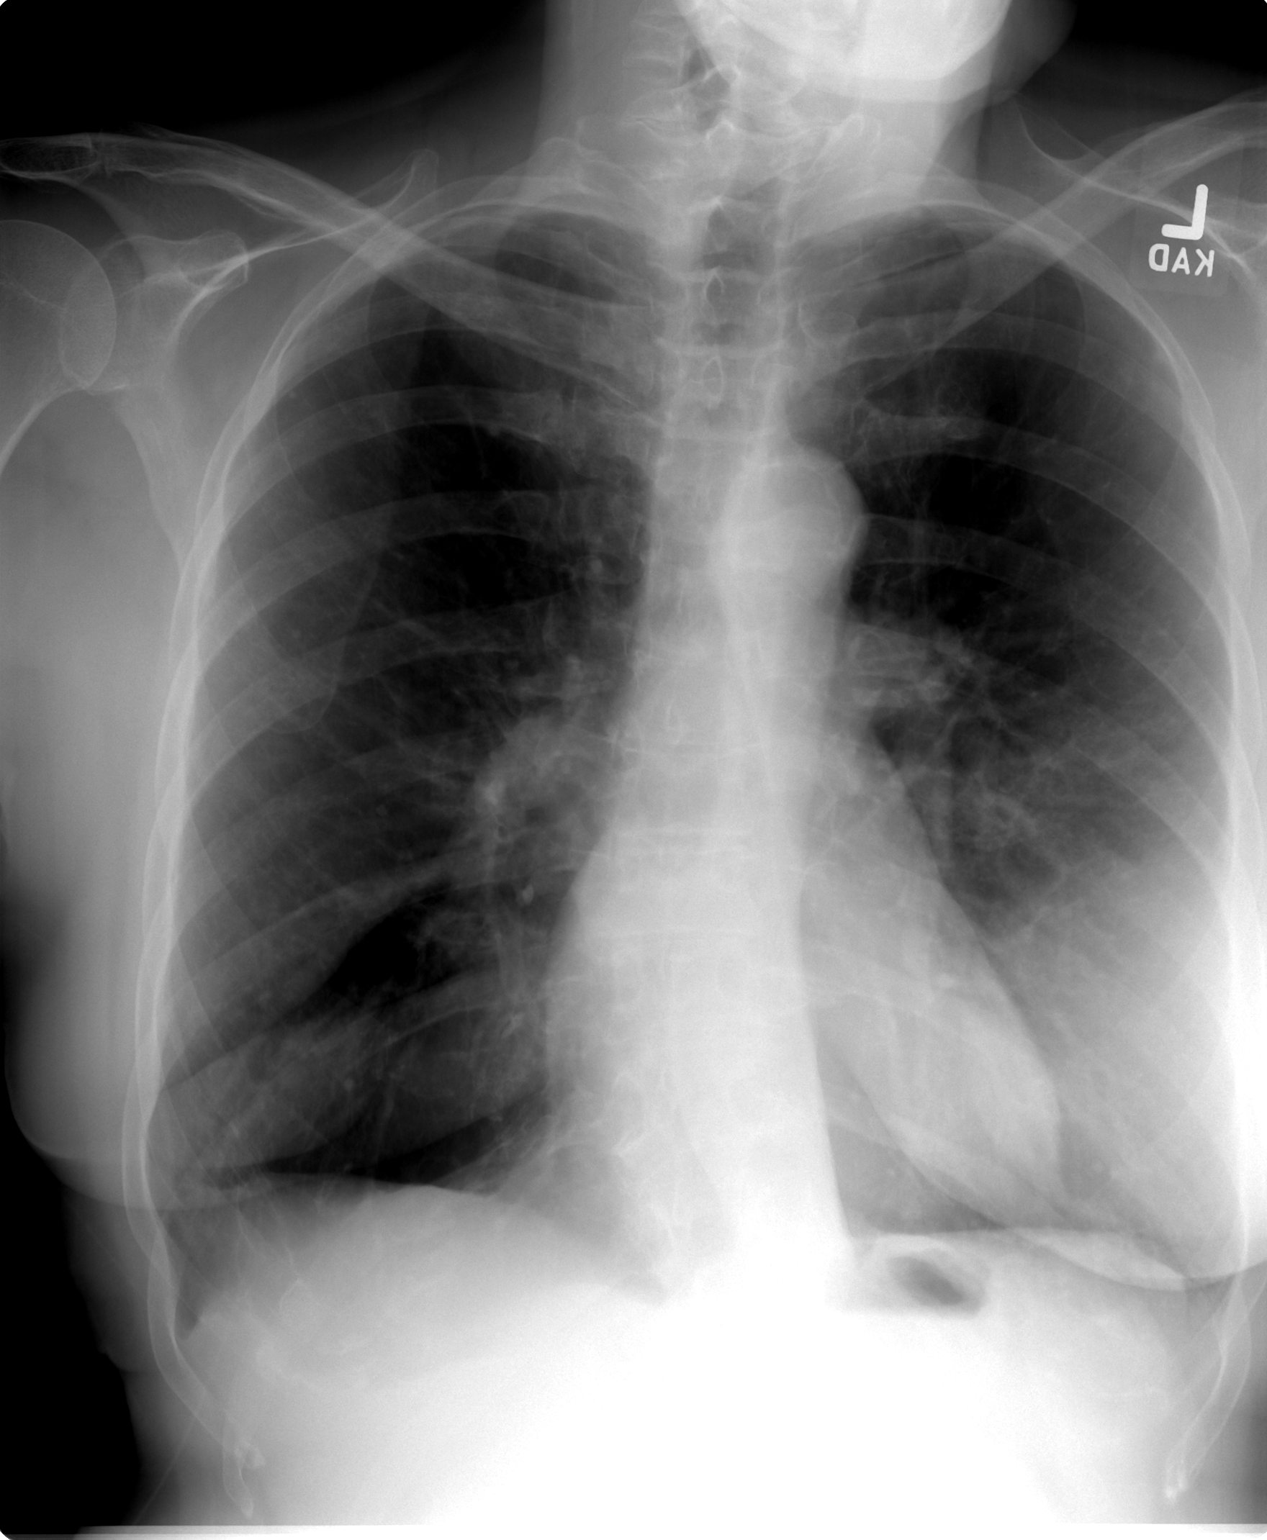

[view not recorded (2 of 2)]
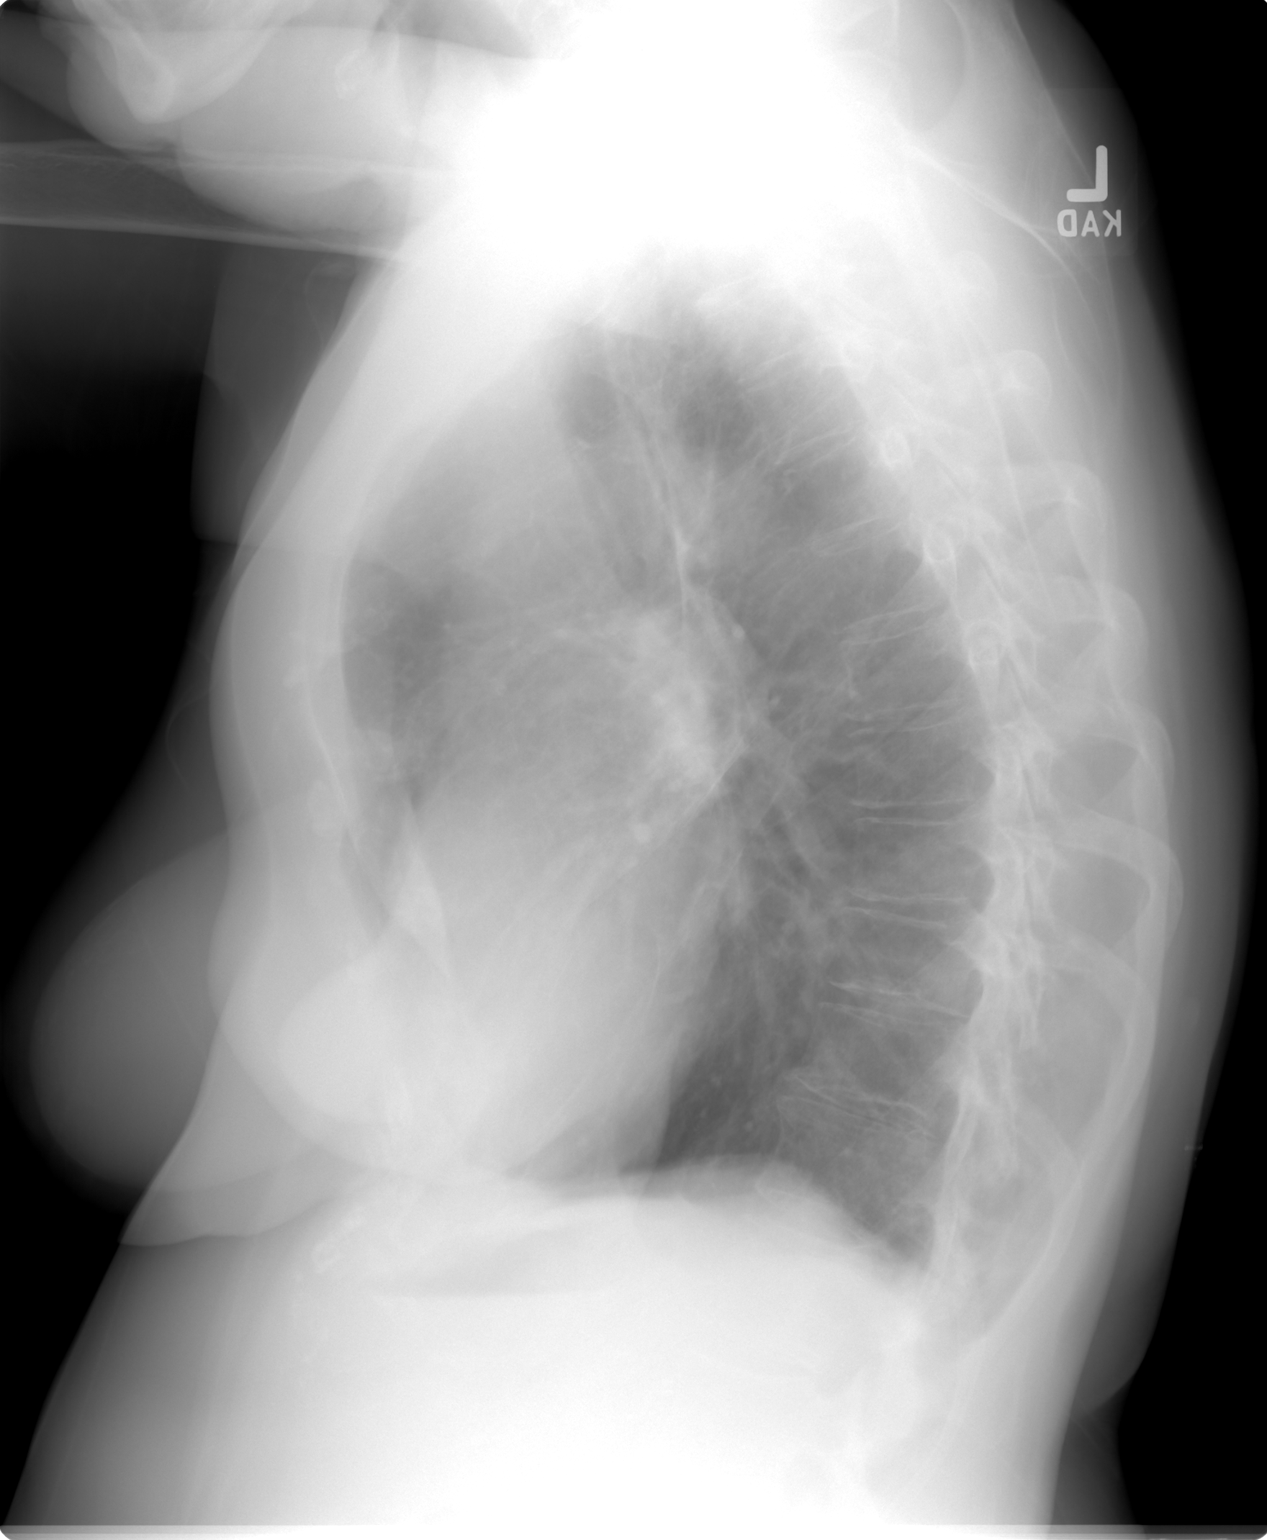

[2 of 2 positions shown; findings below may reference images not displayed]

FINDINGS: There is hyperinflation of the lungs compatible with COPD. Heart and
mediastinal contours are within normal limits. No focal opacities or
effusions. No acute bony abnormality.
IMPRESSION: COPD.  No active disease.

## 2015-01-17 ENCOUNTER — Ambulatory Visit (INDEPENDENT_AMBULATORY_CARE_PROVIDER_SITE_OTHER): Payer: Medicare Other | Admitting: *Deleted

## 2015-01-17 DIAGNOSIS — E538 Deficiency of other specified B group vitamins: Secondary | ICD-10-CM | POA: Diagnosis not present

## 2015-01-17 MED ORDER — CYANOCOBALAMIN 1000 MCG/ML IJ SOLN
1000.0000 ug | Freq: Once | INTRAMUSCULAR | Status: AC
  Administered 2015-01-17: 1000 ug via INTRAMUSCULAR

## 2015-01-22 ENCOUNTER — Encounter: Payer: Medicare Other | Attending: Specialist | Admitting: Respiratory Therapy

## 2015-01-22 ENCOUNTER — Encounter: Payer: Self-pay | Admitting: Respiratory Therapy

## 2015-01-22 VITALS — Ht 67.0 in | Wt 160.0 lb

## 2015-01-22 DIAGNOSIS — Z79899 Other long term (current) drug therapy: Secondary | ICD-10-CM | POA: Diagnosis not present

## 2015-01-22 DIAGNOSIS — K219 Gastro-esophageal reflux disease without esophagitis: Secondary | ICD-10-CM | POA: Insufficient documentation

## 2015-01-22 DIAGNOSIS — J449 Chronic obstructive pulmonary disease, unspecified: Secondary | ICD-10-CM | POA: Diagnosis not present

## 2015-01-22 DIAGNOSIS — I10 Essential (primary) hypertension: Secondary | ICD-10-CM | POA: Diagnosis not present

## 2015-01-22 NOTE — Progress Notes (Signed)
Pulmonary Individual Treatment Plan  Patient Details  Name: Mckenzie Mclaughlin MRN: [id number] Date of Birth: 19-May-1940 Referring Provider:  Erby Pian, MD  Initial Encounter Date: Date: 01/22/15  Visit Diagnosis: COPD, severe ([place])  Patient's Home Medications on Admission:  Current outpatient prescriptions:  .  polyethylene glycol powder (GLYCOLAX/MIRALAX) powder, Take by mouth., Disp: , Rfl:  .  tiotropium (SPIRIVA HANDIHALER) 18 MCG inhalation capsule, INHALE ONE DOSE ONCE DAILY, Disp: , Rfl:  .  tiZANidine (ZANAFLEX) 2 MG tablet, Take 1 tablet by mouth at bedtime as needed for muscle spasms., Disp: , Rfl:  .  ALPRAZolam (XANAX) 0.5 MG tablet, TAKE ONE TABLET BY MOUTH THREE TIMES DAILY AS NEEDED, Disp: 90 tablet, Rfl: 2 .  aspirin EC 81 MG tablet, Take 81 mg by mouth daily., Disp: , Rfl:  .  atorvastatin (LIPITOR) 20 MG tablet, Take 20 mg by mouth daily., Disp: , Rfl:  .  budesonide-formoterol (SYMBICORT) 160-4.5 MCG/ACT inhaler, Inhale 2 puffs into the lungs 2 (two) times daily., Disp: 1 Inhaler, Rfl: 5 .  clopidogrel (PLAVIX) 75 MG tablet, Take 75 mg by mouth daily., Disp: , Rfl:  .  clopidogrel (PLAVIX) 75 MG tablet, TAKE ONE TABLET BY MOUTH ONCE DAILY, Disp: 30 tablet, Rfl: 12 .  cyanocobalamin (,VITAMIN B-12,) 1000 MCG/ML injection, Inject 1 mL (1,000 mcg total) into the muscle every 30 (thirty) days., Disp: 1 mL, Rfl: 0 .  furosemide (LASIX) 20 MG tablet, Take 20 mg by mouth daily., Disp: , Rfl:  .  metoprolol succinate (TOPROL-XL) 50 MG 24 hr tablet, Take 50 mg by mouth daily., Disp: , Rfl:  .  metoprolol succinate (TOPROL-XL) 50 MG 24 hr tablet, TAKE ONE TABLET BY MOUTH ONCE DAILY TAKE  WITH  OR  IMMEDIATELY  FOLLOWING  A  MEAL, Disp: 30 tablet, Rfl: 5 .  nitroGLYCERIN (NITROSTAT) 0.4 MG SL tablet, Place 1 tablet (0.4 mg total) under the tongue every 5 (five) minutes as needed for chest pain (max 3 doses in 15 min)., Disp: 25 tablet, Rfl: 2 .  NON FORMULARY, as needed. 2-3   Liters as needed, Disp: , Rfl:  .  predniSONE (DELTASONE) 10 MG tablet, 40mg  a day x3 days, then 30mg  a day x3 days, then 20mg  a day x3 days, then 10mg  a day x3 days.  With food., Disp: 30 tablet, Rfl: 0 .  PROAIR HFA 108 (90 BASE) MCG/ACT inhaler, INHALE TWO PUFFS BY MOUTH EVERY 6 HOURS AS NEEDED, Disp: 18 each, Rfl: 0 .  tiotropium (SPIRIVA) 18 MCG inhalation capsule, Place 18 mcg into inhaler and inhale every evening., Disp: , Rfl:  .  venlafaxine (EFFEXOR) 37.5 MG tablet, Take 37.5 mg by mouth 2 (two) times daily., Disp: , Rfl:  .  [DISCONTINUED] clonazePAM (KLONOPIN) 0.5 MG tablet, Take 0.5 tablets (0.25 mg total) by mouth 2 (two) times daily as needed for anxiety., Disp: 30 tablet, Rfl: 0  Past Medical History: Past Medical History  Diagnosis Date  . COPD, severe 07/29/98  . Coronary artery disease 01/22/99    Non Q-wave MI, stent RCA  . Hypertension     03/30/00  . History of ETT 08/03/09    Myoview Normal  . History of ETT 05/1999    Cardiolite pos ETT, neg Cardiolite  . Hx of cardiac catheterization 08/29/01    60% RCA 0/w 20-30% lesions  . Hx of cardiac catheterization 01/22/99    w/Stent 90% RCA lesion  . History of ETT 09/02/01  Normal  . History of ETT 04/28/2006    Myoview normal EF 83%  . History of CT scan of head 08/02/09    w/o mild age appropr atrophy  . History of blurry vision 05/06-05/10/2009    Hospital [facility] CP R/O'D Blurry vision, smoking  . History of ETT 04/10/11    normal EF and no ischemia per Dr. Saralyn Pilar  . COPD (chronic obstructive pulmonary disease)   . Anxiety   . Shortness of breath   . Pneumothorax 2/14  . Esophageal dilatation     Pt needs appple sauce to take meds.  . Esophageal stricture     PT needs apple sauce to take meds  . GERD (gastroesophageal reflux disease) 08/29/98    severe-no meds now  . On home oxygen therapy     as needed-has a travel tank  . Complication of anesthesia   . PONV (postoperative nausea and vomiting)      ad breast remocved and see was under anesthesia a long time  . Anginal pain     see dr [name]  "Spams"  . Dysrhythmia     Palpations at times. last time 10/29 ish 2014  . Mental disorder   . Depression   . Head injury, acute, with loss of consciousness 11/2012    unsure how long  . Constipation   . History of blood transfusion   . Status post aortic coarctation stent placement   . Cancer     side of head- skin cancer, squamous cell ca on left foot.    Tobacco Use: History  Smoking status  . Former Smoker -- 0.40 packs/day for 50 years  . Types: Cigarettes  . Quit date: 02/28/2012  Smokeless tobacco  . Never Used    Comment: 1/4 PPD as of 2012    Labs: Recent Review Flowsheet Data    Labs for ITP Cardiac and Pulmonary Rehab Latest Ref Rng 04/21/2011 06/17/2012 07/11/2013 12/06/2013 08/09/2014   Cholestrol 0 - 200 mg/dL 100 - 136 128 115   LDLCALC 0 - 99 mg/dL 44 - 64 51 41   HDL >39.00 mg/dL 45 - 52.70 67(H) 56.10   Trlycerides 0.0 - 149.0 mg/dL 53 - 97.0 49 89.0   Hemoglobin A1c 4.2-6.3 % 5.2 - - - -   PHART 7.350 - 7.450 - 7.424 - - -   PCO2ART 35.0 - 45.0 mmHg - 52.8(H) - - -   HCO3 20.0 - 24.0 mEq/L - 34.6(H) - - -   TCO2 0 - 100 mmol/L - 36 - - -   O2SAT - - 100.0 - - -       ADL UCSD:     ADL UCSD      01/22/15 1130       ADL UCSD   ADL Phase Entry     SOB Score total 31     Rest 0     Walk 2     Stairs 4     Bath 3     Dress 1     Shop 1         Pulmonary Function Assessment:     Pulmonary Function Assessment - 01/22/15 1130    Initial Spirometry Results   FVC% 28 %   FEV1% 21 %   FEV1/FVC Ratio 58.46   Post Bronchodilator Spirometry Results   FVC% 30 %   FEV1% 25 %   FEV1/FVC Ratio 56.39   Breath   Bilateral Breath Sounds Clear;Decreased  Shortness of Breath Yes      Exercise Target Goals: Date: 01/22/15  Exercise Program Goal: Individual exercise prescription set with THRR, safety & activity barriers. Participant  demonstrates ability to understand and report RPE using BORG scale, to self-measure pulse accurately, and to acknowledge the importance of the exercise prescription.  Exercise Prescription Goal: Starting with aerobic activity 30 plus minutes a day, 3 days per week for initial exercise prescription. Provide home exercise prescription and guidelines that participant acknowledges understanding prior to discharge.  Activity Barriers & Risk Stratification:     Activity Barriers & Risk Stratification - 01/22/15 1130    Activity Barriers & Risk Stratification   Activity Barriers Deconditioning;Muscular Weakness   Risk Stratification Moderate      6 Minute Walk:     6 Minute Walk      01/22/15 1401       6 Minute Walk   Phase Initial     Distance 530 feet     Walk Time 4 minutes     Resting HR 82 bpm     Resting BP 128/62 mmHg     Max Ex. HR 102 bpm     Max Ex. BP 168/68 mmHg     RPE 15     Perceived Dyspnea  5     Symptoms No        Initial Exercise Prescription:     Initial Exercise Prescription - 01/22/15 1400    Date of Initial Exercise Prescription   Date 01/22/15   Treadmill   MPH 1.5   Grade 0   Minutes 10   Recumbant Bike   Level 2   RPM 40   Watts 20   Minutes 10   NuStep   Level 2   Watts 40   Minutes 10   Arm Ergometer   Level 1   Watts 10   Minutes 10   REL-XR   Level 2   Watts 40   Minutes 10   Prescription Details   Frequency (times per week) 3   Duration Progress to 30 minutes of continuous aerobic without signs/symptoms of physical distress   Intensity   THRR REST +  30   Ratings of Perceived Exertion 11-15   Perceived Dyspnea 2-4   Progression Continue progressive overload as per policy without signs/symptoms or physical distress.   Resistance Training   Training Prescription No      Exercise Prescription Changes:   Discharge Exercise Prescription (Final Exercise Prescription Changes):    Nutrition:  Target Goals:  Understanding of nutrition guidelines, daily intake of sodium 1500mg , cholesterol 200mg , calories 30% from fat and 7% or less from saturated fats, daily to have 5 or more servings of fruits and vegetables.  Biometrics:     Pre Biometrics - 01/22/15 1403    Pre Biometrics   Height 5\' 7"  (1.702 m)   Weight 160 lb (72.576 kg)   Waist Circumference 41 inches   Hip Circumference 44 inches   Waist to Hip Ratio 0.93 %   BMI (Calculated) 25.1       Nutrition Therapy Plan and Nutrition Goals:     Nutrition Therapy & Goals - 01/22/15 1130    Nutrition Therapy   Diet Ms Ulloa prefers not to meet with the dietitian. She does all the cooking and cooks healthy; they eat out rarely.      Nutrition Discharge: Rate Your Plate Scores:   Psychosocial: Target Goals: Acknowledge presence or absence of depression, maximize  coping skills, provide positive support system. Participant is able to verbalize types and ability to use techniques and skills needed for reducing stress and depression.  Initial Review & Psychosocial Screening:     Initial Psych Review & Screening - 01/22/15 1130    Initial Review   Current issues with Current Depression   Family Dynamics   Good Support System? Yes   Comments Ms Lillibridge has good family support from her husband. She has no children.   Barriers   Psychosocial barriers to participate in program The patient should benefit from training in stress management and relaxation.   Screening Interventions   Interventions Encouraged to exercise;Program counselor consult      Quality of Life Scores:     Quality of Life - 01/22/15 1130    Quality of Life Scores   Health/Function Pre 15.93 %   Socioeconomic Pre 20.8 %   Psych/Spiritual Pre 16.29 %   Family Pre 21.25 %   GLOBAL Pre 17.53 %      PHQ-9:     Recent Review Flowsheet Data    Depression screen [facility] 2/9 01/22/2015 08/10/2014 07/07/2013   Decreased Interest 1 2 2    Down, Depressed, Hopeless 1 2 2     PHQ - 2 Score 2 4 4    Altered sleeping 0 - -   Tired, decreased energy 2 - -   Change in appetite 0 - -   Feeling bad or failure about yourself  1 - -   Trouble concentrating 0 - -   Moving slowly or fidgety/restless 0 - -   Suicidal thoughts 1 - -   PHQ-9 Score 6 - -   Difficult doing work/chores Somewhat difficult - -      Psychosocial Evaluation and Intervention:   Psychosocial Re-Evaluation:  Education: Education Goals: Education classes will be provided on a weekly basis, covering required topics. Participant will state understanding/return demonstration of topics presented.  Learning Barriers/Preferences:     Learning Barriers/Preferences - 01/22/15 1130    Learning Barriers/Preferences   Learning Barriers None   Learning Preferences Group Instruction;Individual Instruction;Pictoral;Skilled Demonstration;Verbal Instruction;Video;Written Material      Education Topics: Initial Evaluation Education: - Verbal, written and demonstration of respiratory meds, RPE/PD scales, oximetry and breathing techniques. Instruction on use of nebulizers and MDIs: cleaning and proper use, rinsing mouth with steroid doses and importance of monitoring MDI activations.          Pulmonary Rehab from 01/22/2015 in [place]   Date  01/22/15   Educator  LB   Instruction Review Code  2- meets goals/outcomes      General Nutrition Guidelines/Fats and Fiber: -Group instruction provided by verbal, written material, models and posters to present the general guidelines for heart healthy nutrition. Gives an explanation and review of dietary fats and fiber.   Controlling Sodium/Reading Food Labels: -Group verbal and written material supporting the discussion of sodium use in heart healthy nutrition. Review and explanation with models, verbal and written materials for utilization of the food label.   Exercise Physiology & Risk Factors: - Group verbal  and written instruction with models to review the exercise physiology of the cardiovascular system and associated critical values. Details cardiovascular disease risk factors and the goals associated with each risk factor.   Aerobic Exercise & Resistance Training: - Gives group verbal and written discussion on the health impact of inactivity. On the components of aerobic and resistive training programs and the benefits of this training  and how to safely progress through these programs.   Flexibility, Balance, General Exercise Guidelines: - Provides group verbal and written instruction on the benefits of flexibility and balance training programs. Provides general exercise guidelines with specific guidelines to those with heart or lung disease. Demonstration and skill practice provided.   Stress Management: - Provides group verbal and written instruction about the health risks of elevated stress, cause of high stress, and healthy ways to reduce stress.   Depression: - Provides group verbal and written instruction on the correlation between heart/lung disease and depressed mood, treatment options, and the stigmas associated with seeking treatment.   Exercise & Equipment Safety: - Individual verbal instruction and demonstration of equipment use and safety with use of the equipment.   Infection Prevention: - Provides verbal and written material to individual with discussion of infection control including proper hand washing and proper equipment cleaning during exercise session.   Falls Prevention: - Provides verbal and written material to individual with discussion of falls prevention and safety.      Pulmonary Rehab from 01/22/2015 in [place]   Date  01/22/15   Educator  LB   Instruction Review Code  2- meets goals/outcomes      Diabetes: - Individual verbal and written instruction to review signs/symptoms of diabetes, desired ranges of  glucose level fasting, after meals and with exercise. Advice that pre and post exercise glucose checks will be done for 3 sessions at entry of program.   Chronic Lung Diseases: - Group verbal and written instruction to review new updates, new respiratory medications, new advancements in procedures and treatments. Provide informative websites and "800" numbers of self-education.   Lung Procedures: - Group verbal and written instruction to describe testing methods done to diagnose lung disease. Review the outcome of test results. Describe the treatment choices: Pulmonary Function Tests, ABGs and oximetry.   Energy Conservation: - Provide group verbal and written instruction for methods to conserve energy, plan and organize activities. Instruct on pacing techniques, use of adaptive equipment and posture/positioning to relieve shortness of breath.   Triggers: - Group verbal and written instruction to review types of environmental controls: home humidity, furnaces, filters, dust mite/pet prevention, HEPA vacuums. To discuss weather changes, air quality and the benefits of nasal washing.   Exacerbations: - Group verbal and written instruction to provide: warning signs, infection symptoms, calling MD promptly, preventive modes, and value of vaccinations. Review: effective airway clearance, coughing and/or vibration techniques. Create an Sports administrator.   Oxygen: - Individual and group verbal and written instruction on oxygen therapy. Includes supplement oxygen, available portable oxygen systems, continuous and intermittent flow rates, oxygen safety, concentrators, and Medicare reimbursement for oxygen.      Pulmonary Rehab from 01/22/2015 in [place]   Date  01/22/15   Educator  LB   Instruction Review Code  2- meets goals/outcomes      Respiratory Medications: - Group verbal and written instruction to review medications for lung disease. Drug class,  frequency, complications, importance of spacers, rinsing mouth after steroid MDI's, and proper cleaning methods for nebulizers.      Pulmonary Rehab from 01/22/2015 in [place]   Date  01/22/15   Educator  LB   Instruction Review Code  2- meets goals/outcomes      AED/CPR: - Group verbal and written instruction with the use of models to demonstrate the basic use of the AED with the  basic ABC's of resuscitation.   Breathing Retraining: - Provides individuals verbal and written instruction on purpose, frequency, and proper technique of diaphragmatic breathing and pursed-lipped breathing. Applies individual practice skills.      Pulmonary Rehab from 01/22/2015 in [place]   Date  01/22/15   Educator  LB   Instruction Review Code  2- meets goals/outcomes      Anatomy and Physiology of the Lungs: - Group verbal and written instruction with the use of models to provide basic lung anatomy and physiology related to function, structure and complications of lung disease.   Heart Failure: - Group verbal and written instruction on the basics of heart failure: signs/symptoms, treatments, explanation of ejection fraction, enlarged heart and cardiomyopathy.   Sleep Apnea: - Individual verbal and written instruction to review Obstructive Sleep Apnea. Review of risk factors, methods for diagnosing and types of masks and machines for OSA.   Anxiety: - Provides group, verbal and written instruction on the correlation between heart/lung disease and anxiety, treatment options, and management of anxiety.   Relaxation: - Provides group, verbal and written instruction about the benefits of relaxation for patients with heart/lung disease. Also provides patients with examples of relaxation techniques.   Knowledge Questionnaire Score:     Knowledge Questionnaire Score - 01/22/15 1130    Knowledge Questionnaire Score    Pre Score -1      Personal Goals and Risk Factors at Admission:     Personal Goals and Risk Factors at Admission - 01/22/15 1238    Personal Goals and Risk Factors on Admission    Weight Management No   Increase Aerobic Exercise and Physical Activity Yes   Intervention While in program, learn and follow the exercise prescription taught. Start at a low level workload and increase workload after able to maintain previous level for 30 minutes. Increase time before increasing intensity.  Ms Withem would like to improve her stamina and endurance so she can do more activity at home .   Understand more about Heart/Pulmonary Disease. Yes   Intervention While in program utilize professionals for any questions, and attend the education sessions. Great websites to use are www.americanheart.org or www.lung.org for reliable information.  Ms Olenick is interested in learning more about COPD and how to manage ther daily life with COPD.   Improve shortness of breath with ADL's Yes   Intervention While in program, learn and follow the exercise prescription taught. Start at a low level workload and increase workload ad advised by the exercise physiologist. Increase time before increasing intensity.  Ms Mcgregory just called a lady to come in and possibly clean for her. She is to short of breath to continue with her housework and want to improve her shortness of breath to be able to do some daily activites.   Develop more efficient breathing techniques such as purse lipped breathing and diaphragmatic breathing; and practicing self-pacing with activity Yes   Intervention While in program, learn and utilize the specific breathing techniques taught to you. Continue to practice and use the techniques as needed.  Good technique with PLB - she has learned this breathing technique and finds it very helpful.   Increase knowledge of respiratory medications and ability to use respiratory devices properly.  Yes   Intervention While  in program learn and demonstrate appropriate use of your oxygen therapy by increasing flow with exertion, manage oxygen tank operation, including continuous and intermittent flow.  Understanding oxygen is a drug ordered  by your physician.  Ms Luviano takes Symbicort, Proair, and Spiriva. A Spacer was given to her with directions. She is with [employer] for her oxygen - 2l/m and uses a gas portable tank.   Hypertension Yes   Goal Participant will see blood pressure controlled within the values of 140/77mm/Hg or within value directed by their physician.   Intervention Provide nutrition & aerobic exercise along with prescribed medications to achieve BP 140/90 or less.      Personal Goals and Risk Factors Review:    Personal Goals Discharge (Final Personal Goals and Risk Factors Review):    Comments:

## 2015-01-22 NOTE — Progress Notes (Signed)
Pulmonary Individual Treatment Plan  Patient Details  Name: Mckenzie Mclaughlin MRN: [id number] Date of Birth: 28-May-1940 Referring Provider:  Dr Wallene Huh  Initial Encounter Date: 01/22/2015  Visit Diagnosis: No diagnosis found.  Patient's Home Medications on Admission:  Current outpatient prescriptions:    ALPRAZolam (XANAX) 0.5 MG tablet, TAKE ONE TABLET BY MOUTH THREE TIMES DAILY AS NEEDED, Disp: 90 tablet, Rfl: 2   aspirin EC 81 MG tablet, Take 81 mg by mouth daily., Disp: , Rfl:    atorvastatin (LIPITOR) 20 MG tablet, Take 20 mg by mouth daily., Disp: , Rfl:    budesonide-formoterol (SYMBICORT) 160-4.5 MCG/ACT inhaler, Inhale 2 puffs into the lungs 2 (two) times daily., Disp: 1 Inhaler, Rfl: 5   clopidogrel (PLAVIX) 75 MG tablet, Take 75 mg by mouth daily., Disp: , Rfl:    clopidogrel (PLAVIX) 75 MG tablet, TAKE ONE TABLET BY MOUTH ONCE DAILY, Disp: 30 tablet, Rfl: 12   cyanocobalamin (,VITAMIN B-12,) 1000 MCG/ML injection, Inject 1 mL (1,000 mcg total) into the muscle every 30 (thirty) days., Disp: 1 mL, Rfl: 0   furosemide (LASIX) 20 MG tablet, Take 20 mg by mouth daily., Disp: , Rfl:    metoprolol succinate (TOPROL-XL) 50 MG 24 hr tablet, Take 50 mg by mouth daily., Disp: , Rfl:    metoprolol succinate (TOPROL-XL) 50 MG 24 hr tablet, TAKE ONE TABLET BY MOUTH ONCE DAILY TAKE  WITH  OR  IMMEDIATELY  FOLLOWING  A  MEAL, Disp: 30 tablet, Rfl: 5   nitroGLYCERIN (NITROSTAT) 0.4 MG SL tablet, Place 1 tablet (0.4 mg total) under the tongue every 5 (five) minutes as needed for chest pain (max 3 doses in 15 min)., Disp: 25 tablet, Rfl: 2   NON FORMULARY, as needed. 2-3  Liters as needed, Disp: , Rfl:    polyethylene glycol powder (GLYCOLAX/MIRALAX) powder, Take by mouth., Disp: , Rfl:    predniSONE (DELTASONE) 10 MG tablet, 40mg  a day x3 days, then 30mg  a day x3 days, then 20mg  a day x3 days, then 10mg  a day x3 days.  With food., Disp: 30 tablet, Rfl: 0   PROAIR HFA 108 (90  BASE) MCG/ACT inhaler, INHALE TWO PUFFS BY MOUTH EVERY 6 HOURS AS NEEDED, Disp: 18 each, Rfl: 0   tiotropium (SPIRIVA HANDIHALER) 18 MCG inhalation capsule, INHALE ONE DOSE ONCE DAILY, Disp: , Rfl:    tiotropium (SPIRIVA) 18 MCG inhalation capsule, Place 18 mcg into inhaler and inhale every evening., Disp: , Rfl:    tiZANidine (ZANAFLEX) 2 MG tablet, Take 1 tablet by mouth at bedtime as needed for muscle spasms., Disp: , Rfl:    venlafaxine (EFFEXOR) 37.5 MG tablet, Take 37.5 mg by mouth 2 (two) times daily., Disp: , Rfl:    [DISCONTINUED] clonazePAM (KLONOPIN) 0.5 MG tablet, Take 0.5 tablets (0.25 mg total) by mouth 2 (two) times daily as needed for anxiety., Disp: 30 tablet, Rfl: 0  Past Medical History: Past Medical History  Diagnosis Date   COPD, severe 07/29/98   Coronary artery disease 01/22/99    Non Q-wave MI, stent RCA   Hypertension     03/30/00   History of ETT 08/03/09    Myoview Normal   History of ETT 05/1999    Cardiolite pos ETT, neg Cardiolite   Hx of cardiac catheterization 08/29/01    60% RCA 0/w 20-30% lesions   Hx of cardiac catheterization 01/22/99    w/Stent 90% RCA lesion   History of ETT 09/02/01    Normal  History of ETT 04/28/2006    Myoview normal EF 83%   History of CT scan of head 08/02/09    w/o mild age appropr atrophy   History of blurry vision 05/06-05/10/2009    [facility] CP R/O'D Blurry vision, smoking   History of ETT 04/10/11    normal EF and no ischemia per Dr. Saralyn Pilar   COPD (chronic obstructive pulmonary disease)    Anxiety    Shortness of breath    Pneumothorax 2/14   Esophageal dilatation     Pt needs appple sauce to take meds.   Esophageal stricture     PT needs apple sauce to take meds   GERD (gastroesophageal reflux disease) 08/29/98    severe-no meds now   On home oxygen therapy     as needed-has a travel tank   Complication of anesthesia    PONV (postoperative nausea and vomiting)     ad  breast remocved and see was under anesthesia a long time   Anginal pain     see dr [name]  "Spams"   Dysrhythmia     Palpations at times. last time 10/29 ish 2014   Mental disorder    Depression    Head injury, acute, with loss of consciousness 11/2012    unsure how long   Constipation    History of blood transfusion    Status post aortic coarctation stent placement    Cancer     side of head- skin cancer, squamous cell ca on left foot.    Tobacco Use: History  Smoking status   Former Smoker -- 0.40 packs/day for 50 years   Types: Cigarettes   Quit date: 02/28/2012  Smokeless tobacco   Never Used    Comment: 1/4 PPD as of 2012    Labs: Recent Review Flowsheet Data    Labs for ITP Cardiac and Pulmonary Rehab Latest Ref Rng 04/21/2011 06/17/2012 07/11/2013 12/06/2013 08/09/2014   Cholestrol 0 - 200 mg/dL 100 - 136 128 115   LDLCALC 0 - 99 mg/dL 44 - 64 51 41   HDL >39.00 mg/dL 45 - 52.70 67(H) 56.10   Trlycerides 0.0 - 149.0 mg/dL 53 - 97.0 49 89.0   Hemoglobin A1c 4.2-6.3 % 5.2 - - - -   PHART 7.350 - 7.450 - 7.424 - - -   PCO2ART 35.0 - 45.0 mmHg - 52.8(H) - - -   HCO3 20.0 - 24.0 mEq/L - 34.6(H) - - -   TCO2 0 - 100 mmol/L - 36 - - -   O2SAT - - 100.0 - - -       ADL UCSD:     ADL UCSD      01/22/15 1130       ADL UCSD   ADL Phase Entry     SOB Score total 31     Rest 0     Walk 2     Stairs 4     Bath 3     Dress 1     Shop 1         Pulmonary Function Assessment:     Pulmonary Function Assessment - 01/22/15 1130    Initial Spirometry Results   FVC% 28 %   FEV1% 21 %   FEV1/FVC Ratio 58.46   Post Bronchodilator Spirometry Results   FVC% 30 %   FEV1% 25 %   FEV1/FVC Ratio 56.39   Breath   Bilateral Breath Sounds Clear;Decreased   Shortness of  Breath Yes      Exercise Target Goals:    Exercise Program Goal: Individual exercise prescription set with THRR, safety & activity barriers. Participant demonstrates ability to  understand and report RPE using BORG scale, to self-measure pulse accurately, and to acknowledge the importance of the exercise prescription.  Exercise Prescription Goal: Starting with aerobic activity 30 plus minutes a day, 3 days per week for initial exercise prescription. Provide home exercise prescription and guidelines that participant acknowledges understanding prior to discharge.  Activity Barriers & Risk Stratification:     Activity Barriers & Risk Stratification - 01/22/15 1130    Activity Barriers & Risk Stratification   Activity Barriers Deconditioning;Muscular Weakness   Risk Stratification Moderate      6 Minute Walk:     6 Minute Walk      01/22/15 1401       6 Minute Walk   Phase Initial     Distance 530 feet     Walk Time 4 minutes     Resting HR 82 bpm     Resting BP 128/62 mmHg     Max Ex. HR 102 bpm     Max Ex. BP 168/68 mmHg     RPE 15     Perceived Dyspnea  5     Symptoms No        Initial Exercise Prescription:     Initial Exercise Prescription - 01/22/15 1400    Date of Initial Exercise Prescription   Date 01/22/15   Treadmill   MPH 1.5   Grade 0   Minutes 10   Recumbant Bike   Level 2   RPM 40   Watts 20   Minutes 10   NuStep   Level 2   Watts 40   Minutes 10   Arm Ergometer   Level 1   Watts 10   Minutes 10   REL-XR   Level 2   Watts 40   Minutes 10   Prescription Details   Frequency (times per week) 3   Duration Progress to 30 minutes of continuous aerobic without signs/symptoms of physical distress   Intensity   THRR REST +  30   Ratings of Perceived Exertion 11-15   Perceived Dyspnea 2-4   Progression Continue progressive overload as per policy without signs/symptoms or physical distress.   Resistance Training   Training Prescription No      Exercise Prescription Changes:   Discharge Exercise Prescription (Final Exercise Prescription Changes):    Nutrition:  Target Goals: Understanding of nutrition  guidelines, daily intake of sodium 1500mg , cholesterol 200mg , calories 30% from fat and 7% or less from saturated fats, daily to have 5 or more servings of fruits and vegetables.  Biometrics:     Pre Biometrics - 01/22/15 1403    Pre Biometrics   Height 5\' 7"  (1.702 m)   Weight 160 lb (72.576 kg)   Waist Circumference 41 inches   Hip Circumference 44 inches   Waist to Hip Ratio 0.93 %   BMI (Calculated) 25.1       Nutrition Therapy Plan and Nutrition Goals:     Nutrition Therapy & Goals - 01/22/15 1130    Nutrition Therapy   Diet Ms Heinke prefers not to meet with the dietitian. She does all the cooking and cooks healthy; they eat out rarely.      Nutrition Discharge: Rate Your Plate Scores:   Psychosocial: Target Goals: Acknowledge presence or absence of depression, maximize coping skills,  provide positive support system. Participant is able to verbalize types and ability to use techniques and skills needed for reducing stress and depression.  Initial Review & Psychosocial Screening:     Initial Psych Review & Screening - 01/22/15 1130    Initial Review   Current issues with Current Depression   Family Dynamics   Good Support System? Yes   Comments Ms Welker has good family support from her husband. She has no children.   Barriers   Psychosocial barriers to participate in program The patient should benefit from training in stress management and relaxation.   Screening Interventions   Interventions Encouraged to exercise;Program counselor consult      Quality of Life Scores:     Quality of Life - 01/22/15 1130    Quality of Life Scores   Health/Function Pre 15.93 %   Socioeconomic Pre 20.8 %   Psych/Spiritual Pre 16.29 %   Family Pre 21.25 %   GLOBAL Pre 17.53 %      PHQ-9:     Recent Review Flowsheet Data    Depression screen [facility] 2/9 01/22/2015 08/10/2014 07/07/2013   Decreased Interest 1 2 2    Down, Depressed, Hopeless 1 2 2    PHQ - 2 Score 2 4 4     Altered sleeping 0 - -   Tired, decreased energy 2 - -   Change in appetite 0 - -   Feeling bad or failure about yourself  1 - -   Trouble concentrating 0 - -   Moving slowly or fidgety/restless 0 - -   Suicidal thoughts 1 - -   PHQ-9 Score 6 - -   Difficult doing work/chores Somewhat difficult - -      Psychosocial Evaluation and Intervention:   Psychosocial Re-Evaluation:  Education: Education Goals: Education classes will be provided on a weekly basis, covering required topics. Participant will state understanding/return demonstration of topics presented.  Learning Barriers/Preferences:     Learning Barriers/Preferences - 01/22/15 1130    Learning Barriers/Preferences   Learning Barriers None   Learning Preferences Group Instruction;Individual Instruction;Pictoral;Skilled Demonstration;Verbal Instruction;Video;Written Material      Education Topics: Initial Evaluation Education: - Verbal, written and demonstration of respiratory meds, RPE/PD scales, oximetry and breathing techniques. Instruction on use of nebulizers and MDIs: cleaning and proper use, rinsing mouth with steroid doses and importance of monitoring MDI activations.          Pulmonary Rehab from 01/22/2015 in [place]   Date  01/22/15   Educator  LB   Instruction Review Code  2- meets goals/outcomes      General Nutrition Guidelines/Fats and Fiber: -Group instruction provided by verbal, written material, models and posters to present the general guidelines for heart healthy nutrition. Gives an explanation and review of dietary fats and fiber.   Controlling Sodium/Reading Food Labels: -Group verbal and written material supporting the discussion of sodium use in heart healthy nutrition. Review and explanation with models, verbal and written materials for utilization of the food label.   Exercise Physiology & Risk Factors: - Group verbal and written instruction  with models to review the exercise physiology of the cardiovascular system and associated critical values. Details cardiovascular disease risk factors and the goals associated with each risk factor.   Aerobic Exercise & Resistance Training: - Gives group verbal and written discussion on the health impact of inactivity. On the components of aerobic and resistive training programs and the benefits of this training and how  to safely progress through these programs.   Flexibility, Balance, General Exercise Guidelines: - Provides group verbal and written instruction on the benefits of flexibility and balance training programs. Provides general exercise guidelines with specific guidelines to those with heart or lung disease. Demonstration and skill practice provided.   Stress Management: - Provides group verbal and written instruction about the health risks of elevated stress, cause of high stress, and healthy ways to reduce stress.   Depression: - Provides group verbal and written instruction on the correlation between heart/lung disease and depressed mood, treatment options, and the stigmas associated with seeking treatment.   Exercise & Equipment Safety: - Individual verbal instruction and demonstration of equipment use and safety with use of the equipment.   Infection Prevention: - Provides verbal and written material to individual with discussion of infection control including proper hand washing and proper equipment cleaning during exercise session.   Falls Prevention: - Provides verbal and written material to individual with discussion of falls prevention and safety.      Pulmonary Rehab from 01/22/2015 in [facility]   Date  01/22/15   Educator  LB   Instruction Review Code  2- meets goals/outcomes      Diabetes: - Individual verbal and written instruction to review signs/symptoms of diabetes, desired ranges of glucose level fasting, after  meals and with exercise. Advice that pre and post exercise glucose checks will be done for 3 sessions at entry of program.   Chronic Lung Diseases: - Group verbal and written instruction to review new updates, new respiratory medications, new advancements in procedures and treatments. Provide informative websites and "800" numbers of self-education.   Lung Procedures: - Group verbal and written instruction to describe testing methods done to diagnose lung disease. Review the outcome of test results. Describe the treatment choices: Pulmonary Function Tests, ABGs and oximetry.   Energy Conservation: - Provide group verbal and written instruction for methods to conserve energy, plan and organize activities. Instruct on pacing techniques, use of adaptive equipment and posture/positioning to relieve shortness of breath.   Triggers: - Group verbal and written instruction to review types of environmental controls: home humidity, furnaces, filters, dust mite/pet prevention, HEPA vacuums. To discuss weather changes, air quality and the benefits of nasal washing.   Exacerbations: - Group verbal and written instruction to provide: warning signs, infection symptoms, calling MD promptly, preventive modes, and value of vaccinations. Review: effective airway clearance, coughing and/or vibration techniques. Create an Sports administrator.   Oxygen: - Individual and group verbal and written instruction on oxygen therapy. Includes supplement oxygen, available portable oxygen systems, continuous and intermittent flow rates, oxygen safety, concentrators, and Medicare reimbursement for oxygen.      Pulmonary Rehab from 01/22/2015 in [place]   Date  01/22/15   Educator  LB   Instruction Review Code  2- meets goals/outcomes      Respiratory Medications: - Group verbal and written instruction to review medications for lung disease. Drug class, frequency, complications,  importance of spacers, rinsing mouth after steroid MDI's, and proper cleaning methods for nebulizers.      Pulmonary Rehab from 01/22/2015 in [place]   Date  01/22/15   Educator  LB   Instruction Review Code  2- meets goals/outcomes      AED/CPR: - Group verbal and written instruction with the use of models to demonstrate the basic use of the AED with the basic ABC's  of resuscitation.   Breathing Retraining: - Provides individuals verbal and written instruction on purpose, frequency, and proper technique of diaphragmatic breathing and pursed-lipped breathing. Applies individual practice skills.      Pulmonary Rehab from 01/22/2015 in [place]   Date  01/22/15   Educator  LB   Instruction Review Code  2- meets goals/outcomes      Anatomy and Physiology of the Lungs: - Group verbal and written instruction with the use of models to provide basic lung anatomy and physiology related to function, structure and complications of lung disease.   Heart Failure: - Group verbal and written instruction on the basics of heart failure: signs/symptoms, treatments, explanation of ejection fraction, enlarged heart and cardiomyopathy.   Sleep Apnea: - Individual verbal and written instruction to review Obstructive Sleep Apnea. Review of risk factors, methods for diagnosing and types of masks and machines for OSA.   Anxiety: - Provides group, verbal and written instruction on the correlation between heart/lung disease and anxiety, treatment options, and management of anxiety.   Relaxation: - Provides group, verbal and written instruction about the benefits of relaxation for patients with heart/lung disease. Also provides patients with examples of relaxation techniques.   Knowledge Questionnaire Score:     Knowledge Questionnaire Score - 01/22/15 1130    Knowledge Questionnaire Score   Pre Score -1       Personal Goals and Risk Factors at Admission:     Personal Goals and Risk Factors at Admission - 01/22/15 1238    Personal Goals and Risk Factors on Admission    Weight Management No   Increase Aerobic Exercise and Physical Activity Yes   Intervention While in program, learn and follow the exercise prescription taught. Start at a low level workload and increase workload after able to maintain previous level for 30 minutes. Increase time before increasing intensity.  Ms Critcher would like to improve her stamina and endurance so she can do more activity at home .   Understand more about Heart/Pulmonary Disease. Yes   Intervention While in program utilize professionals for any questions, and attend the education sessions. Great websites to use are www.americanheart.org or www.lung.org for reliable information.  Ms Keleher is interested in learning more about COPD and how to manage ther daily life with COPD.   Improve shortness of breath with ADL's Yes   Intervention While in program, learn and follow the exercise prescription taught. Start at a low level workload and increase workload ad advised by the exercise physiologist. Increase time before increasing intensity.  Ms Farias just called a lady to come in and possibly clean for her. She is to short of breath to continue with her housework and want to improve her shortness of breath to be able to do some daily activites.   Develop more efficient breathing techniques such as purse lipped breathing and diaphragmatic breathing; and practicing self-pacing with activity Yes   Intervention While in program, learn and utilize the specific breathing techniques taught to you. Continue to practice and use the techniques as needed.  Good technique with PLB - she has learned this breathing technique and finds it very helpful.   Increase knowledge of respiratory medications and ability to use respiratory devices properly.  Yes   Intervention While in program learn  and demonstrate appropriate use of your oxygen therapy by increasing flow with exertion, manage oxygen tank operation, including continuous and intermittent flow.  Understanding oxygen is a drug ordered by your  physician.  Ms Grega takes Symbicort, Proair, and Spiriva. A Spacer was given to her with directions. She is with [employer] for her oxygen - 2l/m and uses a gas portable tank.   Hypertension Yes   Goal Participant will see blood pressure controlled within the values of 140/9mm/Hg or within value directed by their physician.   Intervention Provide nutrition & aerobic exercise along with prescribed medications to achieve BP 140/90 or less.      Personal Goals and Risk Factors Review:    Personal Goals Discharge (Final Personal Goals and Risk Factors Review):    Comments: Ms Nass was planning to start [place] on 01/22/2015, but after calling her insurance company and learning of her 50.00 copay every visit, she will not be attending [facility].

## 2015-01-22 NOTE — Patient Instructions (Signed)
Patient Instructions  Patient Details  Name: Mckenzie Mclaughlin MRN: [id number] Date of Birth: [birth year] Referring Provider:  Erby Pian, MD  Below are the personal goals you chose as well as exercise and nutrition goals. Our goal is to help you keep on track towards obtaining and maintaining your goals. We will be discussing your progress on these goals with you throughout the program.  Initial Exercise Prescription:     Initial Exercise Prescription - 01/22/15 1400    Date of Initial Exercise Prescription   Date 01/22/15   Treadmill   MPH 1.5   Grade 0   Minutes 10   Recumbant Bike   Level 2   RPM 40   Watts 20   Minutes 10   NuStep   Level 2   Watts 40   Minutes 10   Arm Ergometer   Level 1   Watts 10   Minutes 10   REL-XR   Level 2   Watts 40   Minutes 10   Prescription Details   Frequency (times per week) 3   Duration Progress to 30 minutes of continuous aerobic without signs/symptoms of physical distress   Intensity   THRR REST +  30   Ratings of Perceived Exertion 11-15   Perceived Dyspnea 2-4   Progression Continue progressive overload as per policy without signs/symptoms or physical distress.   Resistance Training   Training Prescription No      Exercise Goals: Frequency: Be able to perform aerobic exercise three times per week working toward 3-5 days per week.  Intensity: Work with a perceived exertion of 11 (fairly light) - 15 (hard) as tolerated. Follow your new exercise prescription and watch for changes in prescription as you progress with the program. Changes will be reviewed with you when they are made.  Duration: You should be able to do 30 minutes of continuous aerobic exercise in addition to a 5 minute warm-up and a 5 minute cool-down routine.  Nutrition Goals: Your personal nutrition goals will be established when you do your nutrition analysis with the dietician.  The following are nutrition guidelines to follow: Cholesterol <  200mg /day Sodium < 1500mg /day Fiber: Women over 50 yrs - 21 grams per day  Personal Goals:     Personal Goals and Risk Factors at Admission - 01/22/15 1238    Personal Goals and Risk Factors on Admission    Weight Management No   Increase Aerobic Exercise and Physical Activity Yes   Intervention While in program, learn and follow the exercise prescription taught. Start at a low level workload and increase workload after able to maintain previous level for 30 minutes. Increase time before increasing intensity.  Ms Krogh would like to improve her stamina and endurance so she can do more activity at home .   Understand more about Heart/Pulmonary Disease. Yes   Intervention While in program utilize professionals for any questions, and attend the education sessions. Great websites to use are www.americanheart.org or www.lung.org for reliable information.  Ms Colwell is interested in learning more about COPD and how to manage ther daily life with COPD.   Improve shortness of breath with ADL's Yes   Intervention While in program, learn and follow the exercise prescription taught. Start at a low level workload and increase workload ad advised by the exercise physiologist. Increase time before increasing intensity.  Ms Campau just called a lady to come in and possibly clean for her. She is to short of breath to continue  with her housework and want to improve her shortness of breath to be able to do some daily activites.   Develop more efficient breathing techniques such as purse lipped breathing and diaphragmatic breathing; and practicing self-pacing with activity Yes   Intervention While in program, learn and utilize the specific breathing techniques taught to you. Continue to practice and use the techniques as needed.  Good technique with PLB - she has learned this breathing technique and finds it very helpful.   Increase knowledge of respiratory medications and ability to use respiratory devices properly.   Yes   Intervention While in program learn and demonstrate appropriate use of your oxygen therapy by increasing flow with exertion, manage oxygen tank operation, including continuous and intermittent flow.  Understanding oxygen is a drug ordered by your physician.  Ms Napp takes Symbicort, Proair, and Spiriva. A Spacer was given to her with directions. She is with [employer] for her oxygen - 2l/m and uses a gas portable tank.   Hypertension Yes   Goal Participant will see blood pressure controlled within the values of 140/39mm/Hg or within value directed by their physician.   Intervention Provide nutrition & aerobic exercise along with prescribed medications to achieve BP 140/90 or less.      Tobacco Use Initial Evaluation: History  Smoking status  . Former Smoker -- 0.40 packs/day for 50 years  . Types: Cigarettes  . Quit date: 02/28/2012  Smokeless tobacco  . Never Used    Comment: 1/4 PPD as of 2012    Copy of goals given to participant.

## 2015-01-23 DIAGNOSIS — I509 Heart failure, unspecified: Secondary | ICD-10-CM | POA: Diagnosis not present

## 2015-01-23 DIAGNOSIS — J449 Chronic obstructive pulmonary disease, unspecified: Secondary | ICD-10-CM | POA: Diagnosis not present

## 2015-01-23 DIAGNOSIS — G4733 Obstructive sleep apnea (adult) (pediatric): Secondary | ICD-10-CM | POA: Diagnosis not present

## 2015-01-28 ENCOUNTER — Ambulatory Visit: Payer: Medicare Other

## 2015-01-30 ENCOUNTER — Ambulatory Visit: Payer: Medicare Other

## 2015-02-01 ENCOUNTER — Ambulatory Visit: Payer: Medicare Other

## 2015-02-04 ENCOUNTER — Ambulatory Visit: Payer: Medicare Other

## 2015-02-06 ENCOUNTER — Ambulatory Visit: Payer: Medicare Other

## 2015-02-08 ENCOUNTER — Ambulatory Visit: Payer: Medicare Other

## 2015-02-11 ENCOUNTER — Ambulatory Visit: Payer: Medicare Other

## 2015-02-12 ENCOUNTER — Ambulatory Visit (INDEPENDENT_AMBULATORY_CARE_PROVIDER_SITE_OTHER): Payer: Medicare Other | Admitting: Family Medicine

## 2015-02-12 ENCOUNTER — Other Ambulatory Visit: Payer: Self-pay | Admitting: Family Medicine

## 2015-02-12 ENCOUNTER — Encounter: Payer: Self-pay | Admitting: Family Medicine

## 2015-02-12 VITALS — BP 124/80 | HR 82 | Temp 97.6°F | Wt 167.5 lb

## 2015-02-12 DIAGNOSIS — R319 Hematuria, unspecified: Secondary | ICD-10-CM | POA: Diagnosis not present

## 2015-02-12 DIAGNOSIS — R300 Dysuria: Secondary | ICD-10-CM

## 2015-02-12 DIAGNOSIS — Z0000 Encounter for general adult medical examination without abnormal findings: Secondary | ICD-10-CM | POA: Diagnosis not present

## 2015-02-12 LAB — POCT URINALYSIS DIPSTICK
Bilirubin, UA: NEGATIVE
Glucose, UA: NEGATIVE
KETONES UA: NEGATIVE
LEUKOCYTES UA: NEGATIVE
Nitrite, UA: NEGATIVE
PH UA: 6
PROTEIN UA: NEGATIVE
Urobilinogen, UA: 4

## 2015-02-12 MED ORDER — SULFAMETHOXAZOLE-TRIMETHOPRIM 800-160 MG PO TABS: 1.0000 | ORAL_TABLET | Freq: Two times a day (BID) | ORAL | Status: DC

## 2015-02-12 NOTE — Progress Notes (Signed)
Pre visit review using our clinic review tool, if applicable. No additional management support is needed unless otherwise documented below in the visit note.  She was passing bloody urine.  One episode about 1 month ago, lasted a day or so.  Had discomfort at the time.  Self resolved, didn't seek tx.  No sx thereafter, until recently.  Again with bloody urine and urinary discomfort today.  Blood in urine here at OV on dip.  No fevers.  Former smoker.  No vomiting, no diarrhea.  Lower abd discomfort.    Meds, vitals, and allergies reviewed.   ROS: See HPI.  Otherwise, noncontributory.  On O2, NAD Mmm Neck supple.  rrr ctab abd soft, suprapubic area not ttp No CVA pain  Ext w/o edema

## 2015-02-12 NOTE — Telephone Encounter (Signed)
Alprazolam last refilled 11/18/14 for #90 with 2 refills. Ok to refill this medication?

## 2015-02-12 NOTE — Patient Instructions (Signed)
Drink plenty of water and start the antibiotics today.  We'll contact you with your lab report.  Take care.   

## 2015-02-13 ENCOUNTER — Other Ambulatory Visit: Payer: Self-pay | Admitting: Family Medicine

## 2015-02-13 ENCOUNTER — Ambulatory Visit: Payer: Medicare Other

## 2015-02-13 DIAGNOSIS — R300 Dysuria: Secondary | ICD-10-CM | POA: Insufficient documentation

## 2015-02-13 LAB — URINE CULTURE
Colony Count: NO GROWTH
Organism ID, Bacteria: NO GROWTH

## 2015-02-13 NOTE — Telephone Encounter (Signed)
Please call in.  Thanks.   

## 2015-02-13 NOTE — Telephone Encounter (Signed)
Rx called to pharmacy as instructed. 

## 2015-02-13 NOTE — Telephone Encounter (Deleted)
Pt called to ck on status of xanax; Medication phoned to [place] pharmacy as instructed.pt will ck with pharmacy later today.

## 2015-02-13 NOTE — Assessment & Plan Note (Signed)
Presumed cystitis.  Nontoxic.  Septra, ucx, PO fluids, f/u prn.  D/w pt.  She agrees.

## 2015-02-13 NOTE — Telephone Encounter (Signed)
Pt called to ck on status of xanax; Medication phoned to [place] [place] pharmacy as instructed.pt will ck with pharmacy later today.

## 2015-02-14 DIAGNOSIS — I509 Heart failure, unspecified: Secondary | ICD-10-CM | POA: Diagnosis not present

## 2015-02-14 DIAGNOSIS — G4733 Obstructive sleep apnea (adult) (pediatric): Secondary | ICD-10-CM | POA: Diagnosis not present

## 2015-02-14 DIAGNOSIS — J449 Chronic obstructive pulmonary disease, unspecified: Secondary | ICD-10-CM | POA: Diagnosis not present

## 2015-02-15 ENCOUNTER — Ambulatory Visit: Payer: Medicare Other

## 2015-02-15 ENCOUNTER — Other Ambulatory Visit: Payer: Self-pay | Admitting: Family Medicine

## 2015-02-15 DIAGNOSIS — R319 Hematuria, unspecified: Secondary | ICD-10-CM

## 2015-02-18 ENCOUNTER — Ambulatory Visit: Payer: Medicare Other

## 2015-02-19 ENCOUNTER — Ambulatory Visit (INDEPENDENT_AMBULATORY_CARE_PROVIDER_SITE_OTHER): Payer: Medicare Other | Admitting: *Deleted

## 2015-02-19 DIAGNOSIS — D519 Vitamin B12 deficiency anemia, unspecified: Secondary | ICD-10-CM | POA: Diagnosis not present

## 2015-02-19 MED ORDER — CYANOCOBALAMIN 1000 MCG/ML IJ SOLN
1000.0000 ug | Freq: Once | INTRAMUSCULAR | Status: AC
  Administered 2015-02-19: 1000 ug via INTRAMUSCULAR

## 2015-02-20 ENCOUNTER — Ambulatory Visit: Payer: Medicare Other

## 2015-02-20 ENCOUNTER — Other Ambulatory Visit: Payer: Self-pay | Admitting: Family Medicine

## 2015-02-20 DIAGNOSIS — I2510 Atherosclerotic heart disease of native coronary artery without angina pectoris: Secondary | ICD-10-CM | POA: Diagnosis not present

## 2015-02-20 DIAGNOSIS — I493 Ventricular premature depolarization: Secondary | ICD-10-CM | POA: Diagnosis not present

## 2015-02-20 DIAGNOSIS — I10 Essential (primary) hypertension: Secondary | ICD-10-CM | POA: Diagnosis not present

## 2015-02-20 DIAGNOSIS — J439 Emphysema, unspecified: Secondary | ICD-10-CM | POA: Diagnosis not present

## 2015-02-20 DIAGNOSIS — I214 Non-ST elevation (NSTEMI) myocardial infarction: Secondary | ICD-10-CM | POA: Diagnosis not present

## 2015-02-20 NOTE — Telephone Encounter (Signed)
Sent. Thanks.   

## 2015-02-20 NOTE — Telephone Encounter (Signed)
Received refill request electronically Last refill 01/15/15 Last office visit 02/12/15 Is it okay to refill?

## 2015-02-22 ENCOUNTER — Ambulatory Visit: Payer: Medicare Other

## 2015-02-23 DIAGNOSIS — J449 Chronic obstructive pulmonary disease, unspecified: Secondary | ICD-10-CM | POA: Diagnosis not present

## 2015-02-23 DIAGNOSIS — I509 Heart failure, unspecified: Secondary | ICD-10-CM | POA: Diagnosis not present

## 2015-02-23 DIAGNOSIS — G4733 Obstructive sleep apnea (adult) (pediatric): Secondary | ICD-10-CM | POA: Diagnosis not present

## 2015-02-25 ENCOUNTER — Ambulatory Visit: Payer: Medicare Other

## 2015-02-26 ENCOUNTER — Encounter: Payer: Self-pay | Admitting: Urology

## 2015-02-26 ENCOUNTER — Ambulatory Visit (INDEPENDENT_AMBULATORY_CARE_PROVIDER_SITE_OTHER): Payer: Medicare Other | Admitting: Urology

## 2015-02-26 VITALS — BP 172/77 | HR 90 | Ht 67.0 in | Wt 167.1 lb

## 2015-02-26 DIAGNOSIS — R3129 Other microscopic hematuria: Secondary | ICD-10-CM | POA: Diagnosis not present

## 2015-02-26 DIAGNOSIS — N952 Postmenopausal atrophic vaginitis: Secondary | ICD-10-CM | POA: Diagnosis not present

## 2015-02-26 DIAGNOSIS — R310 Gross hematuria: Secondary | ICD-10-CM

## 2015-02-26 LAB — URINALYSIS, COMPLETE
BILIRUBIN UA: NEGATIVE
GLUCOSE, UA: NEGATIVE
KETONES UA: NEGATIVE
NITRITE UA: NEGATIVE
PROTEIN UA: NEGATIVE
Specific Gravity, UA: 1.02 (ref 1.005–1.030)
UUROB: 0.2 mg/dL (ref 0.2–1.0)
pH, UA: 5.5 (ref 5.0–7.5)

## 2015-02-26 LAB — MICROSCOPIC EXAMINATION: Epithelial Cells (non renal): NONE SEEN /hpf (ref 0–10)

## 2015-02-26 NOTE — Progress Notes (Signed)
02/26/2015 1:30 PM   Mckenzie Mclaughlin 06/28/1940 [id number]  Referring provider: Tonia Ghent, MD [address] [place], [place] [address]  Chief Complaint  Patient presents with  . Hematuria    referred by    HPI: Patient is a 74 year old white female who has had two independent episodes of gross hematuria one month apart referred by her PCP, Dr. Damita Dunnings.    Patient states she had her first episode of gross hematuria one month ago.  It lasted for one day.  She did not experience any dysuria or pain with the hematuria.  It then reoccurred one month later and then she sought treatment from her PCP.   Her urine culture was negative from her visit with him on 02/12/2015.    She has baseline symptoms of nocturia, intermittency, and hesitancy.  She does not have a history of bladder infections.  She is not sexually active.  She states she has been told she has a stone in the distant past.  She has never passed a stone.  She is a former smoker.    She is experiencing a burning sensation when her urine stream hits her vaginal mucosa.     PMH: Past Medical History  Diagnosis Date  . COPD, severe 07/29/98  . Coronary artery disease 01/22/99    Non Q-wave MI, stent RCA  . Hypertension     03/30/00  . History of ETT 08/03/09    Myoview Normal  . History of ETT 05/1999    Cardiolite pos ETT, neg Cardiolite  . Hx of cardiac catheterization 08/29/01    60% RCA 0/w 20-30% lesions  . Hx of cardiac catheterization 01/22/99    w/Stent 90% RCA lesion  . History of ETT 09/02/01    Normal  . History of ETT 04/28/2006    Myoview normal EF 83%  . History of CT scan of head 08/02/09    w/o mild age appropr atrophy  . History of blurry vision 05/06-05/10/2009    Hospital [facility] CP R/O'D Blurry vision, smoking  . History of ETT 04/10/11    normal EF and no ischemia per Dr. Saralyn Pilar  . COPD (chronic obstructive pulmonary disease) ([facility])   . Anxiety   . Shortness of breath   .  Pneumothorax 2/14  . Esophageal dilatation     Pt needs appple sauce to take meds.  . Esophageal stricture     PT needs apple sauce to take meds  . GERD (gastroesophageal reflux disease) 08/29/98    severe-no meds now  . On home oxygen therapy     as needed-has a travel tank  . Complication of anesthesia   . PONV (postoperative nausea and vomiting)     ad breast remocved and see was under anesthesia a long time  . Anginal pain [facility])     see dr [name]  "Spams"  . Dysrhythmia     Palpations at times. last time 10/29 ish 2014  . Mental disorder   . Depression   . Head injury, acute, with loss of consciousness ([place]) 11/2012    unsure how long  . Constipation   . History of blood transfusion   . Status post aortic coarctation stent placement   . Cancer (HCC)     side of head- skin cancer, squamous cell ca on left foot.  . Arthritis   . Depression     Surgical History: Past Surgical History  Procedure Laterality Date  .  Laminectomy  1976    Disc Removal X 2 Lumbar  . Esophageal dilation  06/00    EGD  . Oophorectomy    . Abdominal hysterectomy    . Colonoscopy    . Mastectomy  03/1976    Bilateral due FCBD with implants [facility])  . Carotid stent Right 2000  . Back surgery    . Tissue expander placement Right 02/03/2013    Procedure: REMOVAL OF RIGHT BREAST IMPLANT AND IMPLANT MATERIAL  CAPSULECTOMY;  Surgeon: Irene Limbo, MD;  Location: [facility];  Service: Plastics;  Laterality: Right;  . Breast surgery      implant removal, R breast  . Cataract extraction Bilateral     2016    Home Medications:    Medication List       This list is accurate as of: 02/26/15  1:30 PM.  Always use your most recent med list.               acetaminophen-codeine 300-30 MG tablet  Commonly known as:  TYLENOL #3     ALPRAZolam 0.5 MG tablet  Commonly known as:  XANAX  TAKE ONE TABLET BY MOUTH THREE TIMES DAILY AS NEEDED     aspirin EC 81 MG tablet  Take 81 mg by mouth  daily.     atorvastatin 20 MG tablet  Commonly known as:  LIPITOR  Take 20 mg by mouth daily.     BESIVANCE 0.6 % Susp  Generic drug:  Besifloxacin HCl     budesonide-formoterol 160-4.5 MCG/ACT inhaler  Commonly known as:  SYMBICORT  Inhale 2 puffs into the lungs 2 (two) times daily.     clopidogrel 75 MG tablet  Commonly known as:  PLAVIX  Take 75 mg by mouth daily.     cyanocobalamin 1000 MCG/ML injection  Commonly known as:  (VITAMIN B-12)  Inject 1 mL (1,000 mcg total) into the muscle every 30 (thirty) days.     DUREZOL 0.05 % Emul  Generic drug:  Difluprednate     furosemide 20 MG tablet  Commonly known as:  LASIX  Take 20 mg by mouth daily.     ILEVRO 0.3 % ophthalmic suspension  Generic drug:  nepafenac     LORazepam 0.5 MG tablet  Commonly known as:  ATIVAN  Take 0.5 mg by mouth.     metoprolol succinate 50 MG 24 hr tablet  Commonly known as:  TOPROL-XL  TAKE ONE TABLET BY MOUTH ONCE DAILY TAKE  WITH  OR  IMMEDIATELY  FOLLOWING  A  MEAL     metoprolol tartrate 25 MG tablet  Commonly known as:  LOPRESSOR  Take 25 mg by mouth.     nitroGLYCERIN 0.4 MG SL tablet  Commonly known as:  NITROSTAT  Place 1 tablet (0.4 mg total) under the tongue every 5 (five) minutes as needed for chest pain (max 3 doses in 15 min).     NON FORMULARY  as needed. 2-3  Liters as needed     polyethylene glycol powder powder  Commonly known as:  GLYCOLAX/MIRALAX  Take by mouth.     PROAIR HFA 108 (90 BASE) MCG/ACT inhaler  Generic drug:  albuterol  INHALE TWO PUFFS BY MOUTH EVERY 6 HOURS AS NEEDED     simvastatin 10 MG tablet  Commonly known as:  ZOCOR  Take 10 mg by mouth.     SPIRIVA HANDIHALER 18 MCG inhalation capsule  Generic drug:  tiotropium  INHALE ONE DOSE ONCE DAILY  sulfamethoxazole-trimethoprim 800-160 MG tablet  Commonly known as:  BACTRIM DS,SEPTRA DS  Take 1 tablet by mouth 2 (two) times daily.     tiZANidine 2 MG tablet  Commonly known as:   ZANAFLEX  Take 1 tablet by mouth at bedtime as needed for muscle spasms.     venlafaxine 37.5 MG tablet  Commonly known as:  EFFEXOR  Take 37.5 mg by mouth 2 (two) times daily.        Allergies:  Allergies  Allergen Reactions  . Amoxicillin Shortness Of Breath  . Doxycycline Nausea Only and Other (See Comments)    Dizzy Reaction:  Dizziness   . Sertraline Hcl Nausea And Vomiting    REACTION: trembling    Family History: Family History  Problem Relation Age of Onset  . Stroke Mother   . Heart disease Mother     MI  . Cancer Father     Lung  . COPD Brother   . Cancer Brother     Lung with mets 2011  . Heart disease Brother     CAD  . Heart disease Sister     cirrhosis due heart disease  . Cirrhosis Sister   . Colon cancer Neg Hx   . Breast cancer Neg Hx     Social History:  reports that she quit smoking about 2 years ago. Her smoking use included Cigarettes. She has a 20 pack-year smoking history. She has never used smokeless tobacco. She reports that she does not drink alcohol or use illicit drugs.  ROS: UROLOGY Frequent Urination?: No Hard to postpone urination?: No Burning/pain with urination?: No Get up at night to urinate?: Yes Leakage of urine?: No Urine stream starts and stops?: Yes Trouble starting stream?: Yes Do you have to strain to urinate?: No Blood in urine?: Yes Urinary tract infection?: No Sexually transmitted disease?: No Injury to kidneys or bladder?: No Painful intercourse?: No Weak stream?: No Currently pregnant?: No Vaginal bleeding?: No Last menstrual period?: n  Gastrointestinal Nausea?: No Vomiting?: No Indigestion/heartburn?: Yes Diarrhea?: Yes Constipation?: Yes  Constitutional Fever: No Night sweats?: No Weight loss?: No Fatigue?: No  Skin Skin rash/lesions?: No Itching?: No  Eyes Blurred vision?: No Double vision?: No  Ears/Nose/Throat Sore throat?: No Sinus problems?: No  Hematologic/Lymphatic Swollen  glands?: No Easy bruising?: Yes  Cardiovascular Leg swelling?: No Chest pain?: Yes  Respiratory Cough?: Yes Shortness of breath?: Yes  Endocrine Excessive thirst?: No  Musculoskeletal Back pain?: Yes Joint pain?: No  Neurological Headaches?: No Dizziness?: Yes  Psychologic Depression?: Yes Anxiety?: Yes  Physical Exam: BP 172/77 mmHg  Pulse 90  Ht 5\' 7"  (1.702 m)  Wt 167 lb 1.6 oz (75.796 kg)  BMI 26.17 kg/m2  Constitutional: Well nourished. Alert and oriented, No acute distress. HEENT: [place] AT, moist mucus membranes. Trachea midline, no masses. Cardiovascular: No clubbing, cyanosis, or edema. Respiratory: Normal respiratory effort, no increased work of breathing. GI: Abdomen is soft, non tender, non distended, no abdominal masses. Liver and spleen not palpable.  No hernias appreciated.  Stool sample for occult testing is not indicated.   GU: No CVA tenderness.  No bladder fullness or masses.  Atrophic external genitalia, normal pubic hair distribution, no lesions.  Normal urethral meatus, no lesions, no prolapse, no discharge.   No urethral masses, tenderness and/or tenderness. No bladder fullness, tenderness or masses. Normal vagina mucosa, poor estrogen effect, no discharge, no lesions, good pelvic support, no cystocele or rectocele noted.  Cervix and uterus are surgically absent.  No adnexal/parametria masses or tenderness noted.  Anus and perineum are without rashes or lesions.    Skin: No rashes, bruises or suspicious lesions. Lymph: No cervical or inguinal adenopathy. Neurologic: Grossly intact, no focal deficits, moving all 4 extremities. Psychiatric: Normal mood and affect.  Laboratory Data: Lab Results  Component Value Date   WBC 10.4 11/12/2014   HGB 13.7 11/12/2014   HCT 42.5 11/12/2014   MCV 87.6 11/12/2014   PLT 239 11/12/2014    Lab Results  Component Value Date   CREATININE 0.94 11/12/2014    No results found for: PSA  No results found  for: TESTOSTERONE  Lab Results  Component Value Date   HGBA1C 5.2 04/21/2011    Urinalysis Results for orders placed or performed in visit on 02/26/15  Microscopic Examination  Result Value Ref Range   WBC, UA 6-10 (A) 0 -  5 /hpf   RBC, UA >30 (H) 0 -  2 /hpf   Epithelial Cells (non renal) None seen 0 - 10 /hpf   Bacteria, UA Few (A) None seen/Few  Urinalysis, Complete  Result Value Ref Range   Specific Gravity, UA 1.020 1.005 - 1.030   pH, UA 5.5 5.0 - 7.5   Color, UA Brown (A) Yellow   Appearance Ur Cloudy (A) Clear   Leukocytes, UA Trace (A) Negative   Protein, UA Negative Negative/Trace   Glucose, UA Negative Negative   Ketones, UA Negative Negative   RBC, UA 3+ (A) Negative   Bilirubin, UA Negative Negative   Urobilinogen, Ur 0.2 0.2 - 1.0 mg/dL   Nitrite, UA Negative Negative   Microscopic Examination See below:     Pertinent Imaging: CLINICAL DATA: Right upper quadrant pain. New chest mass.  EXAM: CT ANGIOGRAPHY CHEST  CT ABDOMEN AND PELVIS WITH CONTRAST  TECHNIQUE: Multidetector CT imaging of the chest was performed using the standard protocol during bolus administration of intravenous contrast. Multiplanar CT image reconstructions and MIPs were obtained to evaluate the vascular anatomy. Multidetector CT imaging of the abdomen and pelvis was performed using the standard protocol during bolus administration of intravenous contrast.  CONTRAST: 168mL OMNIPAQUE IOHEXOL 350 MG/ML SOLN  COMPARISON: Chest radiograph, 11/12/2014. Chest CT, 08/22/2014 and abdomen pelvis CT, 03/25/2014.  FINDINGS: CTA CHEST FINDINGS  Angiographic study: No evidence of a pulmonary embolus. Great vessels normal in caliber. No aortic dissection. There is atherosclerotic plaque and calcifications along the thoracic aorta and at the origins of the innominate and left subclavian arteries.  Thoracic inlet: No masses or adenopathy. Visualized thyroid  is unremarkable.  Mediastinum and hila: Heart is normal in size and configuration. There are mild coronary artery calcifications. No mediastinal or hilar masses or pathologically enlarged lymph nodes.  Lungs and pleura: There is no lung mass or focal opacity to account for the retrosternal opacity noted on the current chest radiograph. There are multiple areas of lung scarring which are stable. In the right upper lobe near the apex there is an 8.3 mm irregular focal opacity which is stable from the prior CT. Below this, also in the right upper lobe, on image 47, series 13, there is another focal area of opacity measuring 13 mm in greatest dimension, without significant change from the prior study allowing for differences in measurement technique. All other areas of reticular or focal opacity are also stable most consistent with areas of scarring. There are no areas of lung consolidation. There is no pulmonary edema. Moderate to advanced emphysema is noted most evident in  the upper lobes. No pleural effusion or pneumothorax.  Chest wall: There are changes consistent with right breast surgery stable from the prior CT. A retroglandular implant is noted on the left. No discrete chest wall mass or change from the prior exam.  CT ABDOMEN and PELVIS FINDINGS  Liver: 3 small low-density lesions, largest in the left lobe measuring 13 mm, the other 2 in the left lobe lateral segment, sub cm, all likely cysts and all stable. No other liver abnormality.  Gallbladder and biliary tree: Small amount of increased density material in the dependent gallbladder consistent with sludge or small layering stones, stable. No gallbladder wall thickening or adjacent inflammation. No bile duct dilation.  Spleen, pancreas, adrenal glands: Unremarkable.  Kidneys, ureters, bladder: 4 mm cyst in the posterior midpole the right kidney. No other renal masses. Normal renal enhancement and excretion.  No hydronephrosis. Ureters normal in course and in caliber. Bladder is unremarkable.  Uterus and adnexa: Uterus surgically absent. No adnexal masses.  Lymph nodes: No adenopathy.  Ascites: None.  Gastrointestinal: Mild generalized increased stool burden throughout the colon. No colonic wall thickening or inflammation. No evidence of obstruction. Small bowel is unremarkable. Normal appearance of the stomach. Normal appendix visualized.  Vascular: There is atherosclerotic plaque along the aorta and its branch vessels. No significant stenosis. No aneurysm  MUSCULOSKELETAL  There is a mild compression fracture of T8 with depression of the upper endplate and adjacent sclerosis. This was not present on the prior CT chest dated 08/22/2014. No other fractures. No osteoblastic or osteolytic lesions.  Review of the MIP images confirms the above findings.  IMPRESSION: 1. No evidence of a pulmonary embolus. 2. The questionable mass suggested on the current chest radiographs in the retrosternal area on the lateral view, is not defined on CT. There is no lung mass or focal area of consolidation. This opacity may have been due to superimposed structures on the lateral view, including changes from right breast surgery. 3. There are no acute findings in the lungs. Irregular areas of focal opacity noted on the prior chest CT are stable. No convincing neoplastic disease in the chest. Stable changes of emphysema. 4. No findings in the abdomen or pelvis to explain right upper quadrant pain. 5. Probable gallstones but no evidence of acute cholecystitis. 6. Normal appendix visualized. 7. Musculoskeletal evaluation demonstrates a mild compression fracture of T8 which is new from the prior chest CT. This could be the source of back pain or radiating pain. No other fractures.   Electronically Signed  By: Lajean Manes M.D.  On: 11/12/2014 18:19  Assessment & Plan:   1. Gross  hematuria:  Explained to patient the causes of blood in the urine are as follows: stones, UTI's, damage to the urinary tract and/or cancer.  It is explained to the patient that they will be scheduled for a CT Urogram with contrast material and that in rare instances, an allergic reaction can be serious and even life threatening with the injection of contrast material.   The patient denies any allergies to contrast, iodine and/or seafood and is not taking metformin.  I have explained to the patient that they will  be scheduled for a cystoscopy in our office to evaluate their bladder.  The cystoscopy consists of passing a tube with a lens up through their urethra and into their urinary bladder.   We will inject the urethra with a lidocaine gel prior to introducing the cystoscope to help with any discomfort during the procedure.  After the procedure, they might experience blood in the urine and discomfort with urination.  This will abate after the first few voids.  I have  encouraged the patient to increase water intake  during this time.  Patient denies any allergies to lidocaine.    - Urinalysis, Complete  2. Atrophic vaginitis:   Patient was given a sample of vaginal estrogen cream and instructed to apply 0.5mg  (pea-sized amount)  just inside the vaginal introitus with a finger-tip every night for two weeks and then Monday, Wednesday and Friday nights.  I explained to the patient that vaginally administered estrogen, which causes only a slight increase in the blood estrogen levels, have fewer contraindications and adverse systemic effects that oral HT.   Return for CT Urogram report and cystoscopy.  Zara Council, [facility] [address] [place], [place] [address] [phone]

## 2015-02-27 ENCOUNTER — Ambulatory Visit: Payer: Medicare Other

## 2015-02-28 DIAGNOSIS — N952 Postmenopausal atrophic vaginitis: Secondary | ICD-10-CM | POA: Insufficient documentation

## 2015-03-01 ENCOUNTER — Ambulatory Visit: Payer: Medicare Other

## 2015-03-04 ENCOUNTER — Ambulatory Visit: Payer: Medicare Other

## 2015-03-06 ENCOUNTER — Ambulatory Visit: Payer: Medicare Other

## 2015-03-07 ENCOUNTER — Ambulatory Visit: Payer: Medicare Other

## 2015-03-08 ENCOUNTER — Ambulatory Visit: Payer: Medicare Other

## 2015-03-11 ENCOUNTER — Ambulatory Visit: Payer: Medicare Other

## 2015-03-12 ENCOUNTER — Ambulatory Visit
Admission: RE | Admit: 2015-03-12 | Discharge: 2015-03-12 | Disposition: A | Discharge status: Home / Self Care | Payer: Medicare Other | Source: Ambulatory Visit | Attending: Urology | Admitting: Urology

## 2015-03-12 DIAGNOSIS — R310 Gross hematuria: Secondary | ICD-10-CM | POA: Diagnosis not present

## 2015-03-12 DIAGNOSIS — N200 Calculus of kidney: Secondary | ICD-10-CM | POA: Diagnosis not present

## 2015-03-12 DIAGNOSIS — I2510 Atherosclerotic heart disease of native coronary artery without angina pectoris: Secondary | ICD-10-CM | POA: Insufficient documentation

## 2015-03-12 LAB — POCT I-STAT CREATININE: CREATININE: 0.8 mg/dL (ref 0.44–1.00)

## 2015-03-12 MED ORDER — IOHEXOL 300 MG/ML  SOLN
125.0000 mL | Freq: Once | INTRAMUSCULAR | Status: AC | PRN
  Administered 2015-03-12: 125 mL via INTRAVENOUS

## 2015-03-13 ENCOUNTER — Ambulatory Visit: Payer: Medicare Other

## 2015-03-14 ENCOUNTER — Encounter: Payer: Self-pay | Admitting: Urology

## 2015-03-14 ENCOUNTER — Ambulatory Visit (INDEPENDENT_AMBULATORY_CARE_PROVIDER_SITE_OTHER): Payer: Medicare Other | Admitting: Urology

## 2015-03-14 VITALS — BP 188/78 | HR 112 | Ht 67.0 in | Wt 166.6 lb

## 2015-03-14 DIAGNOSIS — R310 Gross hematuria: Secondary | ICD-10-CM

## 2015-03-14 DIAGNOSIS — N200 Calculus of kidney: Secondary | ICD-10-CM

## 2015-03-14 DIAGNOSIS — D4112 Neoplasm of uncertain behavior of left renal pelvis: Secondary | ICD-10-CM

## 2015-03-14 LAB — URINALYSIS, COMPLETE
Bilirubin, UA: NEGATIVE
GLUCOSE, UA: NEGATIVE
KETONES UA: NEGATIVE
Leukocytes, UA: NEGATIVE
NITRITE UA: NEGATIVE
PROTEIN UA: NEGATIVE
SPEC GRAV UA: 1.015 (ref 1.005–1.030)
UUROB: 0.2 mg/dL (ref 0.2–1.0)
pH, UA: 5.5 (ref 5.0–7.5)

## 2015-03-14 LAB — MICROSCOPIC EXAMINATION: BACTERIA UA: NONE SEEN

## 2015-03-14 MED ORDER — LIDOCAINE HCL 2 % EX GEL
1.0000 "application " | Freq: Once | CUTANEOUS | Status: AC
  Administered 2015-03-14: 1 via URETHRAL

## 2015-03-14 MED ORDER — CIPROFLOXACIN HCL 500 MG PO TABS
500.0000 mg | ORAL_TABLET | Freq: Once | ORAL | Status: AC
  Administered 2015-03-14: 500 mg via ORAL

## 2015-03-14 NOTE — Progress Notes (Addendum)
11:11 AM   Mckenzie Mclaughlin 11/29/1940 [id number]  Referring provider: Tonia Ghent, MD [address] [place], [place] [address]  Chief Complaint  Patient presents with  . Cysto    HPI: 74 year old white female who has had two independent episodes of gross hematuria one month apart referred by her PCP, Dr. Damita Dunnings.    Patient states she had her first episode of gross hematuria one month ago.  It lasted for one day.  She did not experience any dysuria or pain with the hematuria.  It then reoccurred one month later and then she sought treatment from her PCP.   Her urine culture was negative from her visit with him on 02/12/2015.    She has baseline symptoms of nocturia, intermittency, and hesitancy.  She does not have a history of bladder infections.  She is not sexually active.  She states she has been told she has a stone in the distant past.  She has never passed a stone.  She is a former smoker.    She is experiencing a burning sensation when her urine stream hits her vaginal mucosa.    She presents today for follow-up of his CT urogram as well as cystoscopy.   PMH: Past Medical History  Diagnosis Date  . COPD, severe 07/29/98  . Coronary artery disease 01/22/99    Non Q-wave MI, stent RCA  . Hypertension     03/30/00  . History of ETT 08/03/09    Myoview Normal  . History of ETT 05/1999    Cardiolite pos ETT, neg Cardiolite  . Hx of cardiac catheterization 08/29/01    60% RCA 0/w 20-30% lesions  . Hx of cardiac catheterization 01/22/99    w/Stent 90% RCA lesion  . History of ETT 09/02/01    Normal  . History of ETT 04/28/2006    Myoview normal EF 83%  . History of CT scan of head 08/02/09    w/o mild age appropr atrophy  . History of blurry vision 05/06-05/10/2009    Hospital [facility] CP R/O'D Blurry vision, smoking  . History of ETT 04/10/11    normal EF and no ischemia per Dr. Saralyn Pilar  . COPD (chronic obstructive pulmonary disease) ([facility] [address])   . Anxiety     . Shortness of breath   . Pneumothorax 2/14  . Esophageal dilatation     Pt needs appple sauce to take meds.  . Esophageal stricture     PT needs apple sauce to take meds  . GERD (gastroesophageal reflux disease) 08/29/98    severe-no meds now  . On home oxygen therapy     as needed-has a travel tank  . Complication of anesthesia   . PONV (postoperative nausea and vomiting)     ad breast remocved and see was under anesthesia a long time  . Anginal pain [facility]     see dr [name]  "Spams"  . Dysrhythmia     Palpations at times. last time 10/29 ish 2014  . Mental disorder   . Depression   . Head injury, acute, with loss of consciousness ([place]) 11/2012    unsure how long  . Constipation   . History of blood transfusion   . Status post aortic coarctation stent placement   . Arthritis   . Depression   . Cancer (HCC)     side of head- skin cancer, squamous cell ca on left foot.    Surgical History: Past Surgical  History  Procedure Laterality Date  . Laminectomy  1976    Disc Removal X 2 Lumbar  . Esophageal dilation  06/00    EGD  . Oophorectomy    . Abdominal hysterectomy    . Colonoscopy    . Mastectomy  03/1976    Bilateral due FCBD with implants [name])  . Carotid stent Right 2000  . Back surgery    . Tissue expander placement Right 02/03/2013    Procedure: REMOVAL OF RIGHT BREAST IMPLANT AND IMPLANT MATERIAL  CAPSULECTOMY;  Surgeon: Irene Limbo, MD;  Location: [facility];  Service: Plastics;  Laterality: Right;  . Breast surgery      implant removal, R breast  . Cataract extraction Bilateral     2016    Home Medications:    Medication List       This list is accurate as of: 03/14/15 11:11 AM.  Always use your most recent med list.               acetaminophen-codeine 300-30 MG tablet  Commonly known as:  TYLENOL #3     ALPRAZolam 0.5 MG tablet  Commonly known as:  XANAX  TAKE ONE TABLET BY MOUTH THREE TIMES DAILY AS NEEDED     aspirin EC 81 MG  tablet  Take 81 mg by mouth daily.     atorvastatin 20 MG tablet  Commonly known as:  LIPITOR  Take 20 mg by mouth daily.     BESIVANCE 0.6 % Susp  Generic drug:  Besifloxacin HCl     budesonide-formoterol 160-4.5 MCG/ACT inhaler  Commonly known as:  SYMBICORT  Inhale 2 puffs into the lungs 2 (two) times daily.     clopidogrel 75 MG tablet  Commonly known as:  PLAVIX  Take 75 mg by mouth daily.     cyanocobalamin 1000 MCG/ML injection  Commonly known as:  (VITAMIN B-12)  Inject 1 mL (1,000 mcg total) into the muscle every 30 (thirty) days.     DUREZOL 0.05 % Emul  Generic drug:  Difluprednate     furosemide 20 MG tablet  Commonly known as:  LASIX  Take 20 mg by mouth daily.     ILEVRO 0.3 % ophthalmic suspension  Generic drug:  nepafenac     LORazepam 0.5 MG tablet  Commonly known as:  ATIVAN  Take 0.5 mg by mouth.     metoprolol succinate 50 MG 24 hr tablet  Commonly known as:  TOPROL-XL  TAKE ONE TABLET BY MOUTH ONCE DAILY TAKE  WITH  OR  IMMEDIATELY  FOLLOWING  A  MEAL     metoprolol tartrate 25 MG tablet  Commonly known as:  LOPRESSOR  Take 25 mg by mouth.     nitroGLYCERIN 0.4 MG SL tablet  Commonly known as:  NITROSTAT  Place 1 tablet (0.4 mg total) under the tongue every 5 (five) minutes as needed for chest pain (max 3 doses in 15 min).     NON FORMULARY  as needed. 2-3  Liters as needed     polyethylene glycol powder powder  Commonly known as:  GLYCOLAX/MIRALAX  Take by mouth.     PROAIR HFA 108 (90 BASE) MCG/ACT inhaler  Generic drug:  albuterol  INHALE TWO PUFFS BY MOUTH EVERY 6 HOURS AS NEEDED     simvastatin 10 MG tablet  Commonly known as:  ZOCOR  Take 10 mg by mouth.     SPIRIVA HANDIHALER 18 MCG inhalation capsule  Generic drug:  tiotropium  INHALE ONE DOSE ONCE DAILY     tiZANidine 2 MG tablet  Commonly known as:  ZANAFLEX  Take 1 tablet by mouth at bedtime as needed for muscle spasms.     venlafaxine 37.5 MG tablet  Commonly  known as:  EFFEXOR  Take 37.5 mg by mouth 2 (two) times daily.        Allergies:  Allergies  Allergen Reactions  . Amoxicillin Shortness Of Breath  . Doxycycline Nausea Only and Other (See Comments)    Dizzy Reaction:  Dizziness   . Sertraline Hcl Nausea And Vomiting    REACTION: trembling    Family History: Family History  Problem Relation Age of Onset  . Stroke Mother   . Heart disease Mother     MI  . Cancer Father     Lung  . COPD Brother   . Cancer Brother     Lung with mets 2011  . Heart disease Brother     CAD  . Heart disease Sister     cirrhosis due heart disease  . Cirrhosis Sister   . Colon cancer Neg Hx   . Breast cancer Neg Hx     Social History:  reports that she quit smoking about 3 years ago. Her smoking use included Cigarettes. She has a 20 pack-year smoking history. She has never used smokeless tobacco. She reports that she does not drink alcohol or use illicit drugs.   Physical Exam: BP 188/78 mmHg  Pulse 112  Ht 5\' 7"  (1.702 m)  Wt 166 lb 9.6 oz (75.569 kg)  BMI 26.09 kg/m2  Constitutional: Well nourished. Alert and oriented, No acute distress. HEENT: [place] AT, moist mucus membranes. Trachea midline, no masses. Cardiovascular: No clubbing, cyanosis, or edema. Respiratory: Normal respiratory effort, mild increased WOB, wearning 02. GI: Abdomen is soft, non tender, non distended, no abdominal masses. Liver and spleen not palpable.  No hernias appreciated.  Stool sample for occult testing is not indicated.   GU: No CVA tenderness.  No bladder fullness or masses.  Atrophic external genitalia,normal urethral meatus. Skin: No rashes, bruises or suspicious lesions. Neurologic: Grossly intact, no focal deficits, moving all 4 extremities. Psychiatric: Normal mood and affect.  Laboratory Data: Lab Results  Component Value Date   WBC 10.4 11/12/2014   HGB 13.7 11/12/2014   HCT 42.5 11/12/2014   MCV 87.6 11/12/2014   PLT 239 11/12/2014    Lab  Results  Component Value Date   CREATININE 0.80 03/12/2015    Lab Results  Component Value Date   HGBA1C 5.2 04/21/2011    Urinalysis Results for orders placed or performed during the hospital encounter of 03/12/15  I-STAT creatinine  Result Value Ref Range   Creatinine, Ser 0.80 0.44 - 1.00 mg/dL   UA reviewed, see epic  Pertinent Imaging: CLINICAL DATA: 74 year old female with history of gross hematuria for the past 2 months. Burning during urination. History of kidney stones.  EXAM: CT ABDOMEN AND PELVIS WITHOUT AND WITH CONTRAST  TECHNIQUE: Multidetector CT imaging of the abdomen and pelvis was performed following the standard protocol before and following the bolus administration of intravenous contrast.  CONTRAST: 13mL OMNIPAQUE IOHEXOL 300 MG/ML SOLN  COMPARISON: CT the abdomen and pelvis 11/12/2014.  FINDINGS: Lower chest: Mild scarring in the right lung base. Atherosclerotic calcifications in the right coronary artery.  Hepatobiliary: Multiple small low-attenuation liver lesions, unchanged in size, number and distribution compared to prior studies. The largest of these measures 1.5 cm and is compatible  with a simple cyst. The smaller lesions are too small to characterize, but also likely represent tiny benign lesions such as cysts. No suspicious hepatic lesions. No intra or extrahepatic biliary ductal dilatation. Gallbladder is unremarkable in appearance.  Pancreas: No pancreatic mass. No pancreatic ductal dilatation. No pancreatic or peripancreatic fluid or inflammatory changes.  Spleen: Unremarkable.  Adrenals/Urinary Tract: 3 nonobstructive calculi are present within the interpolar and lower pole collecting system of the left kidney, largest of which measures 9 mm. No additional calculi are identified within the right renal collecting system, along the course of either ureter, or within the lumen of the urinary bladder. Fullness of  the left renal pelvis and left renal collecting system, without frank hydronephrosis. This is also associated with some enhancement of the urothelium throughout the left renal pelvis and left renal collecting system. Sub cm low-attenuation lesion in the posterior aspect of the interpolar region of the right kidney. No other suspicious renal lesions are noted. On delayed images, there are no filling defects within the collecting system of either kidney, along the well opacified portions of either ureter (the distal third of the right ureter is incompletely opacified), or within the lumen of the urinary bladder to strongly suggest the presence of a urothelial neoplasm at this time. Urinary bladder is normal in appearance. Bilateral adrenal glands are normal in appearance.  Stomach/Bowel: Normal appearance of the stomach. No pathologic dilatation of small bowel or colon. Normal appendix.  Vascular/Lymphatic: Atherosclerosis throughout the abdominal and pelvic vasculature, without evidence of aneurysm and dissection. No lymphadenopathy noted in the abdomen or pelvis.  Reproductive: Status post hysterectomy. Ovaries are not confidently identified, and may be surgically absent or atrophic.  Other: No significant volume of ascites. No pneumoperitoneum.  Musculoskeletal: There are no aggressive appearing lytic or blastic lesions noted in the visualized portions of the skeleton.  IMPRESSION: 1. Several nonobstructive calculi are present within the left renal collecting system. No ureteral stones or findings to suggest urinary tract obstruction are noted at this time. 2. However, there is mild prominence of the left renal pelvis, as well as avid enhancement of the urothelium in the left renal pelvis and left renal collecting system, which is favored to be related to chronic inflammation from the indwelling stones. The possibility of upper tract infection is not excluded, however,  there are no overt imaging findings on today's examination to suggest pyelonephritis. Correlation with urinalysis and urine cytology is recommended. 3. Atherosclerosis. 4. Additional incidental findings, as above.   Electronically Signed  By: Vinnie Langton M.D.  On: 03/12/2015 13:43    Cystoscopy Procedure Note  Patient identification was confirmed, informed consent was obtained, and patient was prepped using Betadine solution.  Lidocaine jelly was administered per urethral meatus.    Preoperative abx where received prior to procedure.    Procedure: - Flexible cystoscope introduced, without any difficulty.   - Thorough search of the bladder revealed:    normal urethral meatus    normal urothelium    no stones    no ulcers     no tumors    no urethral polyps    no trabeculation  - Ureteral orifices were normal in position and appearance.  Post-Procedure: - Patient tolerated the procedure well    Assessment & Plan:   1. Gross hematuria Likely related to stone.  Cystoscopy today negative.  Urine cytology sent today in setting of possible left renal pelvic mucosal abnormality.   - Urinalysis, Complete - ciprofloxacin (CIPRO) tablet 500  mg; Take 1 tablet (500 mg total) by mouth once. - lidocaine (XYLOCAINE) 2 % jelly 1 application; Place 1 application into the urethra once.  2. Kidney stone on left side Asymptomatic 8.8 mm left renal pelvis stone along with other much smaller non obstructing lower pole stones.  Asymptomatic.  Review of previous imaging reveal no significant interval growth over the past year.  Given her lack of symptoms and other medical comorbidities, discussed continued observation vs. Surgical intervention with ESWL vs. URS.  She would prefer to continue to follow her stones which does seem reasonable given history of COPD on 02, CAD, etc.    3. 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  Discussed findings with patient and CT reviewed.  Unable to totally rule out malignancy but less likely.  Will send urine cytology to help r/o malingancy.  Offered diagnostic ureteroscopy but the patient would prefer to defer and understands the risks which seems reasonable given her relative high risk for anesthesia and other medical issues.   -f/u urine cytology   Return in about 6 months (around 09/12/2015) for reassess kidney stones.  May consider KUB/ RUS vs. Repeat CT Urogram at that time depending on symptoms. Advised to return sooner if develops flank pain or any other concerning symptoms.    Hollice Espy, MD  [facility] [address] [place], [place] [address] [phone]  Addendum:  Urine cytology NEGATIVE

## 2015-03-15 ENCOUNTER — Ambulatory Visit: Payer: Medicare Other

## 2015-03-16 DIAGNOSIS — J449 Chronic obstructive pulmonary disease, unspecified: Secondary | ICD-10-CM | POA: Diagnosis not present

## 2015-03-16 DIAGNOSIS — G4733 Obstructive sleep apnea (adult) (pediatric): Secondary | ICD-10-CM | POA: Diagnosis not present

## 2015-03-16 DIAGNOSIS — I509 Heart failure, unspecified: Secondary | ICD-10-CM | POA: Diagnosis not present

## 2015-03-18 ENCOUNTER — Ambulatory Visit: Payer: Medicare Other

## 2015-03-18 IMAGING — CR DG CHEST 2V
1 series · 2 of 2 positions shown · non-contrast
Comparison: 10/05/2013.  05/26/2013.

CLINICAL DATA: Chest pain.

EXAM:
CHEST  2 VIEW

[Series 1: w chest pa · 0.14mm/px · 2 of 2 slices shown]
[im 1/2]
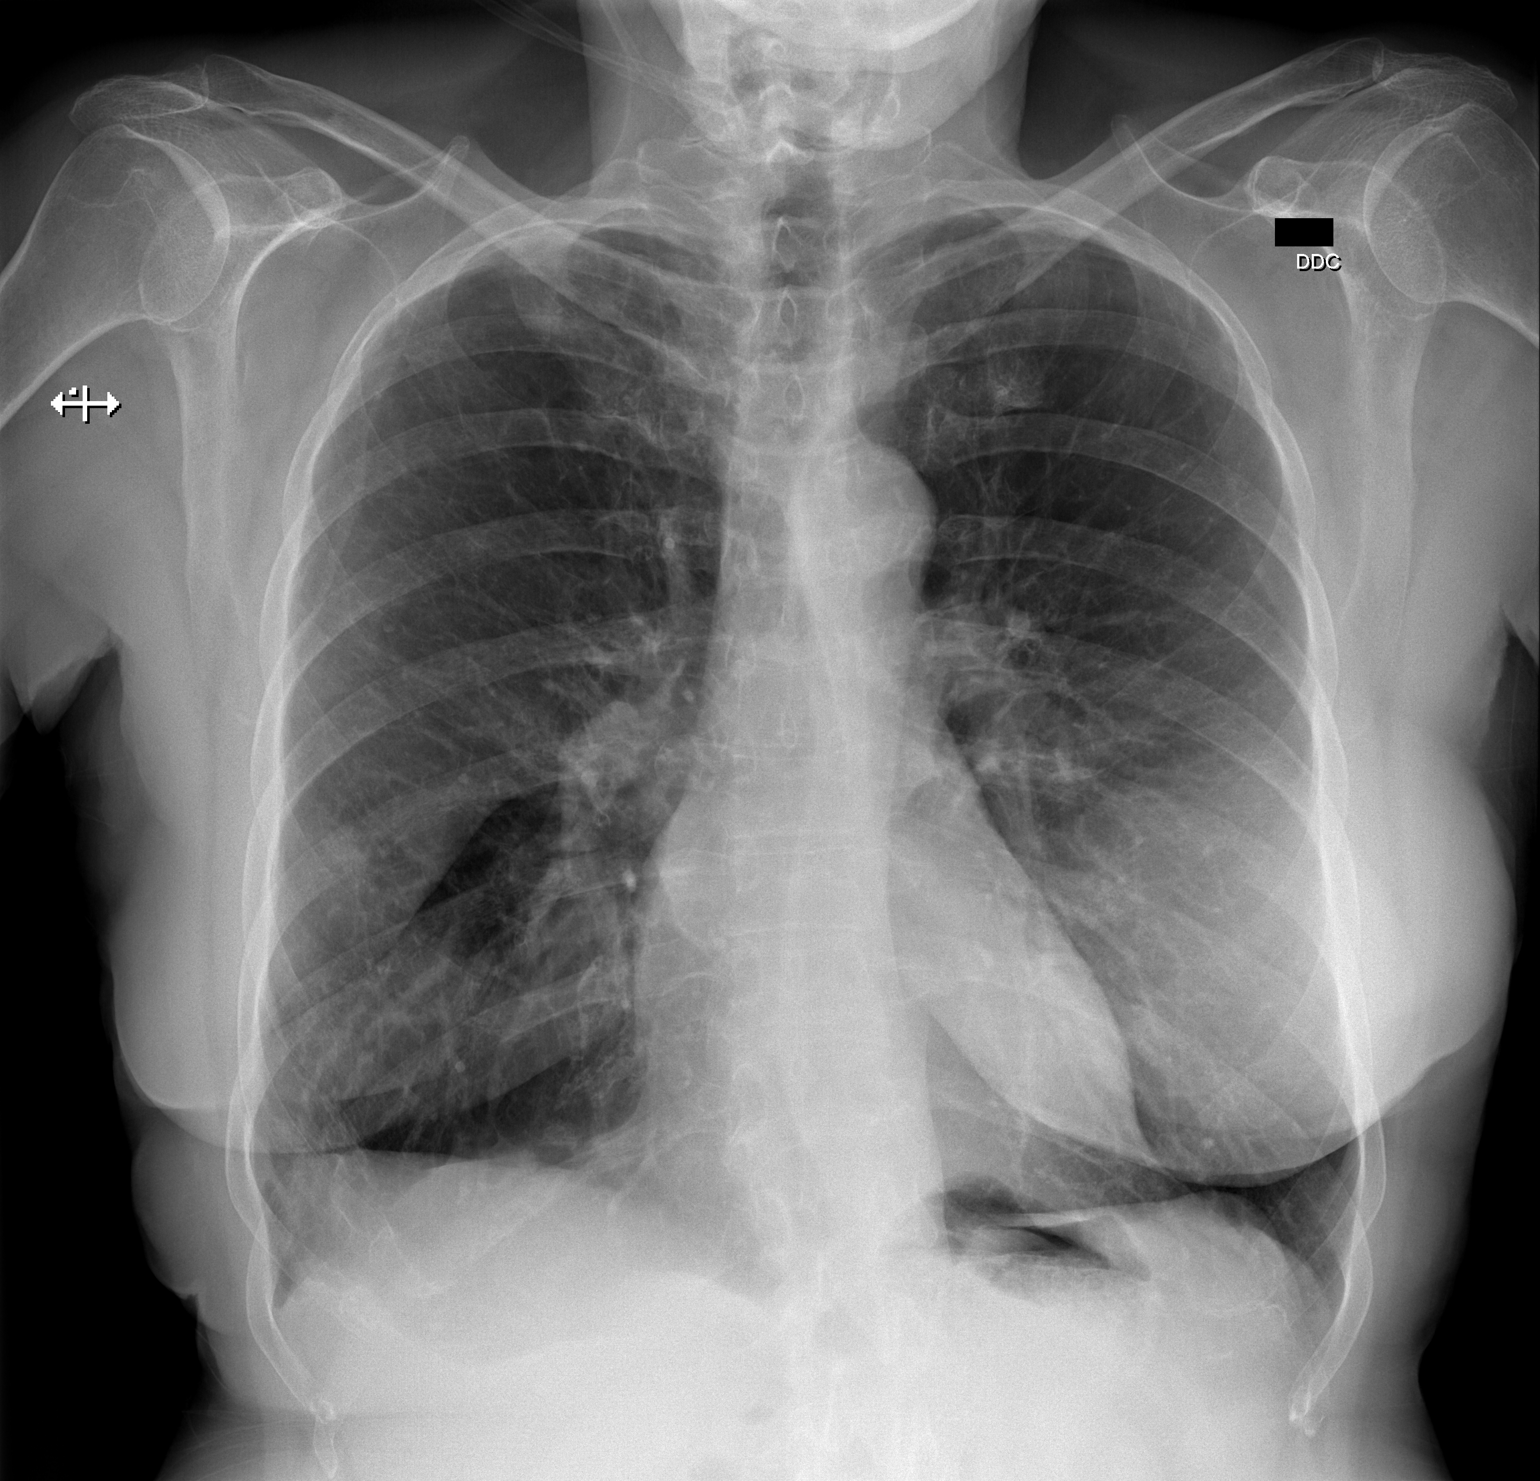
[im 2/2]
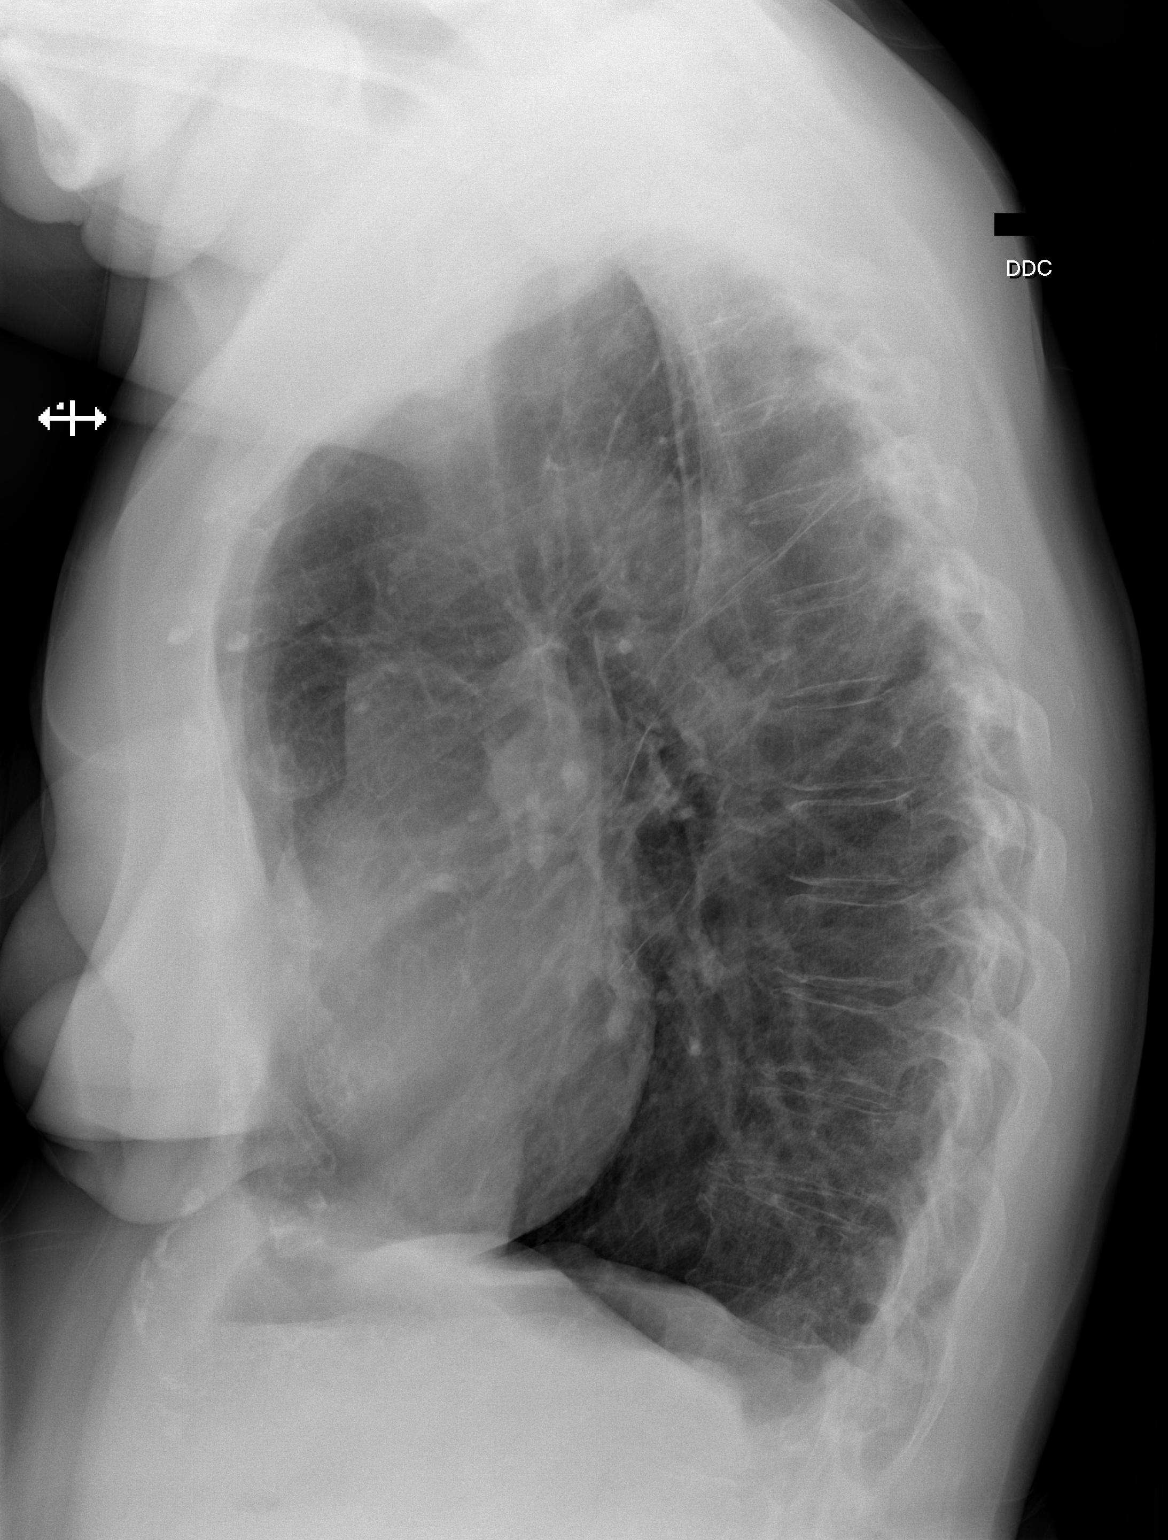

[2 of 2 positions shown; findings below may reference images not displayed]

FINDINGS: There is no interval change compared to the recent prior chest
radiograph. Emphysema is present. Lucency is present at the RIGHT
lung base which represents a combination of emphysema and scarring
associated with removal of the RIGHT breast implant. No airspace
disease. Chronic blunting of the costophrenic angles associated with
emphysema. Enlargement of the retrosternal clear space on the
lateral view.
IMPRESSION: Emphysema without acute cardiopulmonary disease. Hyperlucency at the
RIGHT lung base associated with soft tissue defect and removal of
RIGHT breast implant.

## 2015-03-20 ENCOUNTER — Ambulatory Visit: Payer: Medicare Other

## 2015-03-21 ENCOUNTER — Encounter: Payer: Self-pay | Admitting: Urology

## 2015-03-22 ENCOUNTER — Ambulatory Visit: Payer: Medicare Other

## 2015-03-25 DIAGNOSIS — I509 Heart failure, unspecified: Secondary | ICD-10-CM | POA: Diagnosis not present

## 2015-03-25 DIAGNOSIS — G4733 Obstructive sleep apnea (adult) (pediatric): Secondary | ICD-10-CM | POA: Diagnosis not present

## 2015-03-25 DIAGNOSIS — J449 Chronic obstructive pulmonary disease, unspecified: Secondary | ICD-10-CM | POA: Diagnosis not present

## 2015-03-26 ENCOUNTER — Ambulatory Visit: Payer: Medicare Other

## 2015-03-26 ENCOUNTER — Ambulatory Visit (INDEPENDENT_AMBULATORY_CARE_PROVIDER_SITE_OTHER): Payer: Medicare Other | Admitting: Family Medicine

## 2015-03-26 ENCOUNTER — Encounter: Payer: Self-pay | Admitting: Family Medicine

## 2015-03-26 VITALS — BP 120/68 | HR 68 | Temp 98.3°F | Wt 163.5 lb

## 2015-03-26 DIAGNOSIS — E538 Deficiency of other specified B group vitamins: Secondary | ICD-10-CM | POA: Diagnosis not present

## 2015-03-26 DIAGNOSIS — J441 Chronic obstructive pulmonary disease with (acute) exacerbation: Secondary | ICD-10-CM

## 2015-03-26 MED ORDER — LEVOFLOXACIN 500 MG PO TABS: 500.0000 mg | ORAL_TABLET | Freq: Every day | ORAL | Status: DC

## 2015-03-26 MED ORDER — CYANOCOBALAMIN 1000 MCG/ML IJ SOLN
1000.0000 ug | Freq: Once | INTRAMUSCULAR | Status: AC
  Administered 2015-03-26: 1000 ug via INTRAMUSCULAR

## 2015-03-26 NOTE — Progress Notes (Signed)
Pre visit review using our clinic review tool, if applicable. No additional management support is needed unless otherwise documented below in the visit note.  O2 dependent at baseline.  Started getting ill about 10 days ago, with GI upset, dec in appetite.  Then started coughing more.  Was getting better then the cough worsened again.  More sputum in the last few days, discolored.  Some rhinorrhea in the last few days.   Recently felt hot, likely had a fever, tmax recorded ~100.   A little more SOB in the last few days; has also noted episodic inc in resting heart rate.  She was recently constipated, but had a BM this AM with some relief.  She passed some blood with a BM this AM, this is rare and a/w larger BMs.  D/w pt.  If she didn't have consistent sx, then she didn't want invasive w/u, esp given her other pulmonary issues.  She has some blood in urine, with recent urology eval noted.  She is still urinating small amount of blood occ.  She has f/u with urology pending, with plan for sooner f/u if she has more blood in urine.  Blood in urine is attributed to her renal stone.    Meds, vitals, and allergies reviewed.   ROS: See HPI.  Otherwise, noncontributory.  On O2, NAD Chronically ill appearing but no inc in wob OP wnl, MMM Neck supple, no LA rrr Global dec in BS,  Baseline for patient, but LLL with rhonchi Abd soft Ext w/o edema Speaking in complete sentences.

## 2015-03-26 NOTE — Patient Instructions (Signed)
Start levaquin today, keep using your inhalers and update me if not better in a few days. Take care.  Glad to see you.

## 2015-03-27 ENCOUNTER — Ambulatory Visit: Payer: Medicare Other

## 2015-03-27 NOTE — Assessment & Plan Note (Signed)
Presumed, likely, still okay for outpatient f/u.  She didn't want to restart pred at this point, and I agree given the lack of wheeze.  Start levaquin and continue baseline meds.  She agrees.  Will update me as needed.

## 2015-03-29 ENCOUNTER — Ambulatory Visit: Payer: Medicare Other

## 2015-04-03 ENCOUNTER — Ambulatory Visit: Payer: Medicare Other

## 2015-04-05 ENCOUNTER — Ambulatory Visit: Payer: Medicare Other

## 2015-04-08 ENCOUNTER — Ambulatory Visit: Payer: Medicare Other

## 2015-04-10 ENCOUNTER — Ambulatory Visit: Payer: Medicare Other

## 2015-04-12 ENCOUNTER — Ambulatory Visit: Payer: Medicare Other

## 2015-04-15 ENCOUNTER — Ambulatory Visit: Payer: Medicare Other

## 2015-04-16 DIAGNOSIS — G4733 Obstructive sleep apnea (adult) (pediatric): Secondary | ICD-10-CM | POA: Diagnosis not present

## 2015-04-16 DIAGNOSIS — J449 Chronic obstructive pulmonary disease, unspecified: Secondary | ICD-10-CM | POA: Diagnosis not present

## 2015-04-16 DIAGNOSIS — I509 Heart failure, unspecified: Secondary | ICD-10-CM | POA: Diagnosis not present

## 2015-04-17 ENCOUNTER — Ambulatory Visit: Payer: Medicare Other

## 2015-04-19 ENCOUNTER — Ambulatory Visit: Payer: Medicare Other

## 2015-04-22 ENCOUNTER — Ambulatory Visit: Payer: Medicare Other

## 2015-04-23 ENCOUNTER — Ambulatory Visit (INDEPENDENT_AMBULATORY_CARE_PROVIDER_SITE_OTHER): Payer: Medicare Other | Admitting: *Deleted

## 2015-04-23 DIAGNOSIS — E538 Deficiency of other specified B group vitamins: Secondary | ICD-10-CM

## 2015-04-23 MED ORDER — CYANOCOBALAMIN 1000 MCG/ML IJ SOLN
1000.0000 ug | Freq: Once | INTRAMUSCULAR | Status: AC
  Administered 2015-04-23: 1000 ug via INTRAMUSCULAR

## 2015-04-24 ENCOUNTER — Ambulatory Visit: Payer: Medicare Other

## 2015-04-25 DIAGNOSIS — G4733 Obstructive sleep apnea (adult) (pediatric): Secondary | ICD-10-CM | POA: Diagnosis not present

## 2015-04-25 DIAGNOSIS — I509 Heart failure, unspecified: Secondary | ICD-10-CM | POA: Diagnosis not present

## 2015-04-25 DIAGNOSIS — J449 Chronic obstructive pulmonary disease, unspecified: Secondary | ICD-10-CM | POA: Diagnosis not present

## 2015-05-08 DIAGNOSIS — J439 Emphysema, unspecified: Secondary | ICD-10-CM | POA: Diagnosis not present

## 2015-05-08 DIAGNOSIS — R938 Abnormal findings on diagnostic imaging of other specified body structures: Secondary | ICD-10-CM | POA: Diagnosis not present

## 2015-05-08 DIAGNOSIS — R0902 Hypoxemia: Secondary | ICD-10-CM | POA: Diagnosis not present

## 2015-05-08 DIAGNOSIS — R0609 Other forms of dyspnea: Secondary | ICD-10-CM | POA: Diagnosis not present

## 2015-05-15 DIAGNOSIS — J410 Simple chronic bronchitis: Secondary | ICD-10-CM | POA: Diagnosis not present

## 2015-05-15 DIAGNOSIS — I10 Essential (primary) hypertension: Secondary | ICD-10-CM | POA: Diagnosis not present

## 2015-05-15 DIAGNOSIS — I2510 Atherosclerotic heart disease of native coronary artery without angina pectoris: Secondary | ICD-10-CM | POA: Diagnosis not present

## 2015-05-15 DIAGNOSIS — I493 Ventricular premature depolarization: Secondary | ICD-10-CM | POA: Diagnosis not present

## 2015-05-15 DIAGNOSIS — I214 Non-ST elevation (NSTEMI) myocardial infarction: Secondary | ICD-10-CM | POA: Diagnosis not present

## 2015-05-16 ENCOUNTER — Other Ambulatory Visit: Payer: Self-pay | Admitting: *Deleted

## 2015-05-16 IMAGING — US ULTRASOUND RIGHT BREAST
1 series · 2 of 2 positions shown · non-contrast
Comparison: 05/18/2011

REASON FOR EXAM: RT BR PAIN IQ IMPLANT PRESENT TRAUMA BANDA DOLO PH [REDACTED] FA [REDACTED]
COMMENTS:

PROCEDURE:     MAM - MAM US BREAST RIGHT  - December 30, 2012 [DATE]
CLINICAL DATA: Patient presents for a bilateral diagnostic
mammogram due to a region of pain and lumpiness over the inner
breast after a fall 1 month ago with significant bruising to this
region.
DIGITAL DIAGNOSTIC BILATERAL MAMMOGRAM WITH CAD AND RIGHT BREAST
ULTRASOUND:

[Series 1: ultrasound right breast · 0.08mm/px · 2 of 2 slices shown]
[im 1/2]
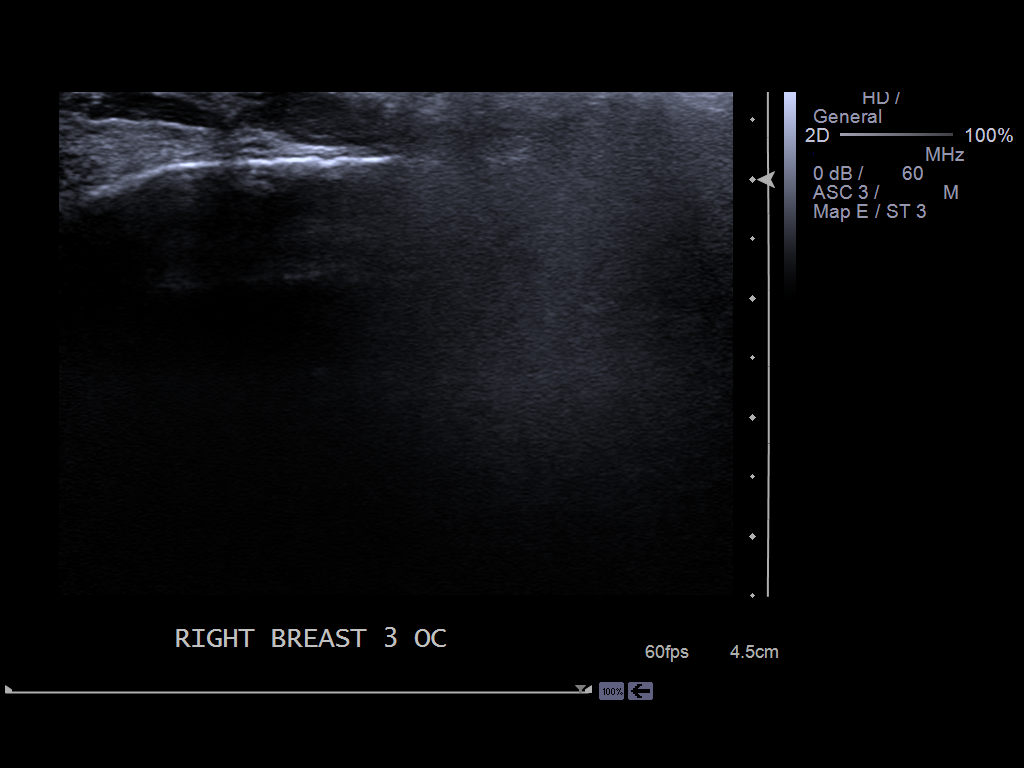
[im 2/2]
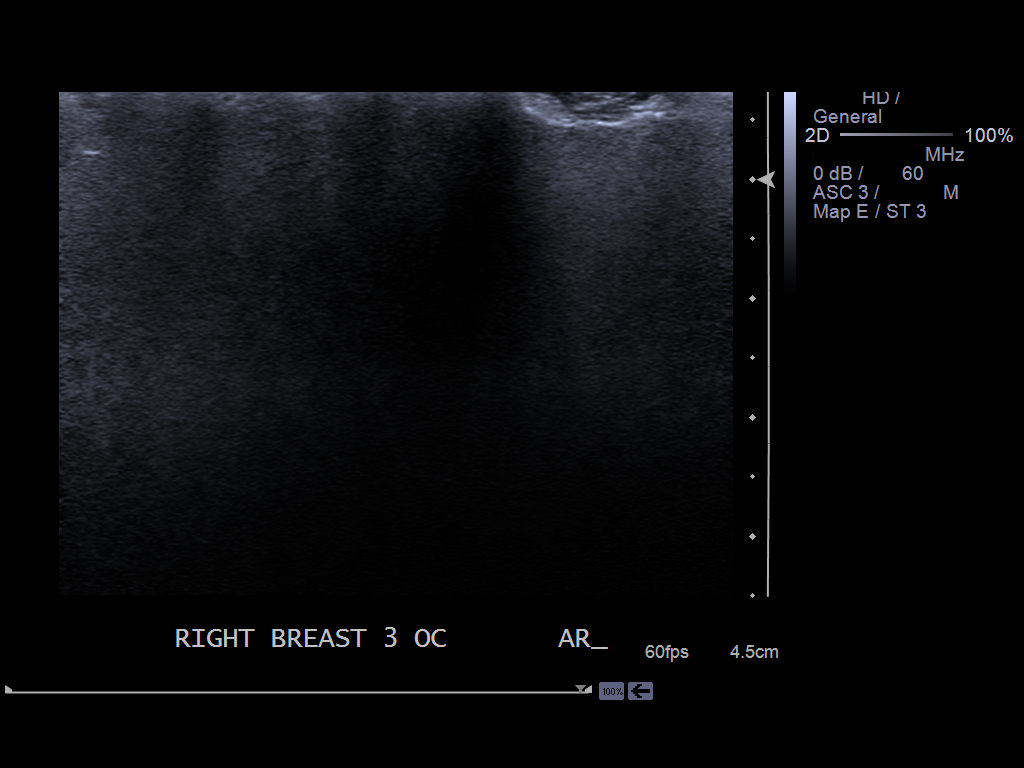

[2 of 2 positions shown; findings below may reference images not displayed]

FINDINGS: ACR Breast Density Category b:  There are scattered areas of
fibroglandular density.

The exam demonstrates bilateral subglandular silicone implants.
There has been a change in the right implant as there is loss of
the normal contour over the inner midportion of the right implant
with adjacent diffuse increased high-density material within the
breast tissue compatible focal implant rupture and free silicone.
Remainder of the exam is unchanged.

Mammographic images were processed with CAD.

On physical exam, I palpate the soft diffuse nodularity over the
inner right breast.

Ultrasound is performed, showing loss of the normal implant contour
over the inner right breast approximately 5 cm from the nipple.
Findings are compatible with focal implant rupture with diffuse
free silicone over the inner breast.
IMPRESSION: Focal rupture of the right subglandular silicone implant over the
inner midportion of the right breast with diffuse free silicone
throughout the inner breast.

RECOMMENDATION:
Would recommend plastic surgical consultation.  Otherwise,
recommend continued annual bilateral screening mammographic follow-
up.

I have discussed the findings and recommendations with the patient.
Results were also provided in writing at the conclusion of the
visit.  If applicable, a reminder letter will be sent to the
patient regarding the next appointment.

BI-RADS CATEGORY 2:  Benign finding(s).

## 2015-05-16 MED ORDER — VENLAFAXINE HCL 37.5 MG PO TABS: 37.5000 mg | ORAL_TABLET | Freq: Two times a day (BID) | ORAL | Status: DC

## 2015-05-16 MED ORDER — ALPRAZOLAM 0.5 MG PO TABS: 0.5000 mg | ORAL_TABLET | Freq: Three times a day (TID) | ORAL | Status: DC | PRN

## 2015-05-16 NOTE — Telephone Encounter (Signed)
Please call in.  Thanks.   

## 2015-05-16 NOTE — Telephone Encounter (Signed)
Electronic refill request.  90 tablet 2 02/13/2015  Please advise.

## 2015-05-16 NOTE — Telephone Encounter (Signed)
Medication phoned to pharmacy.  

## 2015-05-17 DIAGNOSIS — J449 Chronic obstructive pulmonary disease, unspecified: Secondary | ICD-10-CM | POA: Diagnosis not present

## 2015-05-17 DIAGNOSIS — G4733 Obstructive sleep apnea (adult) (pediatric): Secondary | ICD-10-CM | POA: Diagnosis not present

## 2015-05-17 DIAGNOSIS — I509 Heart failure, unspecified: Secondary | ICD-10-CM | POA: Diagnosis not present

## 2015-05-24 ENCOUNTER — Ambulatory Visit (INDEPENDENT_AMBULATORY_CARE_PROVIDER_SITE_OTHER): Payer: Medicare Other | Admitting: *Deleted

## 2015-05-24 ENCOUNTER — Other Ambulatory Visit: Payer: Self-pay | Admitting: *Deleted

## 2015-05-24 DIAGNOSIS — E538 Deficiency of other specified B group vitamins: Secondary | ICD-10-CM | POA: Diagnosis not present

## 2015-05-24 MED ORDER — CYANOCOBALAMIN 1000 MCG/ML IJ SOLN
1000.0000 ug | Freq: Once | INTRAMUSCULAR | Status: AC
  Administered 2015-05-24: 1000 ug via INTRAMUSCULAR

## 2015-05-24 MED ORDER — ALBUTEROL SULFATE HFA 108 (90 BASE) MCG/ACT IN AERS: INHALATION_SPRAY | RESPIRATORY_TRACT | Status: DC

## 2015-05-26 DIAGNOSIS — G4733 Obstructive sleep apnea (adult) (pediatric): Secondary | ICD-10-CM | POA: Diagnosis not present

## 2015-05-26 DIAGNOSIS — I509 Heart failure, unspecified: Secondary | ICD-10-CM | POA: Diagnosis not present

## 2015-05-26 DIAGNOSIS — J449 Chronic obstructive pulmonary disease, unspecified: Secondary | ICD-10-CM | POA: Diagnosis not present

## 2015-06-04 IMAGING — CT CT CHEST W/O CM
2 of 3 series · 14 of 36 positions shown, 17 images · non-contrast
Comparison: Chest CT 05/26/2013.

CLINICAL DATA: 72-year-old female with history of cough and
congestion for the past year, an underlying history of COPD.
Followup for abnormal chest CT.

EXAM:
CT CHEST WITHOUT CONTRAST
TECHNIQUE: Multidetector CT imaging of the chest was performed following the
standard protocol without IV contrast..

[Series 3: routine chest wo_ · axial · 0.64mm/px · z∈[-230,+65]mm · 11 of 71 slices shown, 14 images]
[im 6/71  mediastinal]
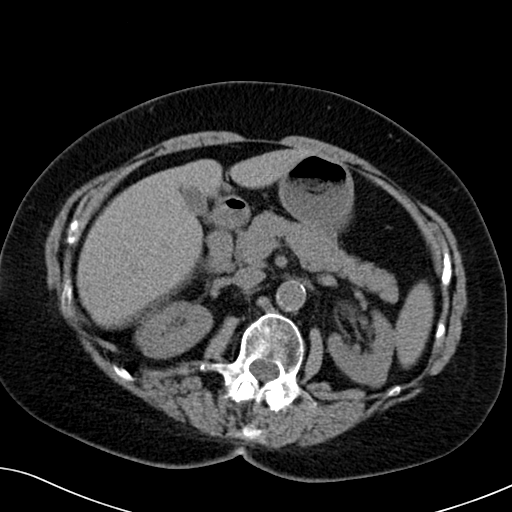
[im 6/71  lung]
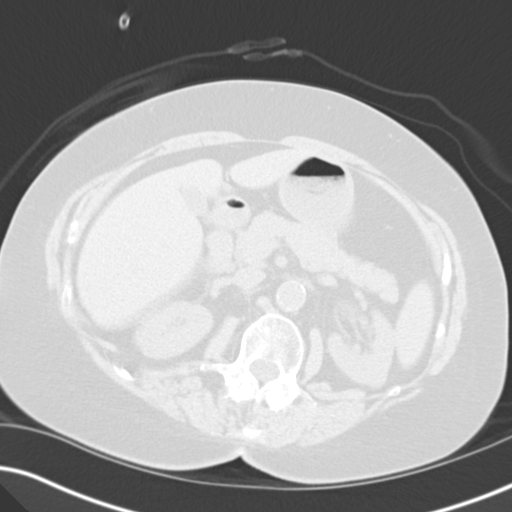
[im 11/71  lung]
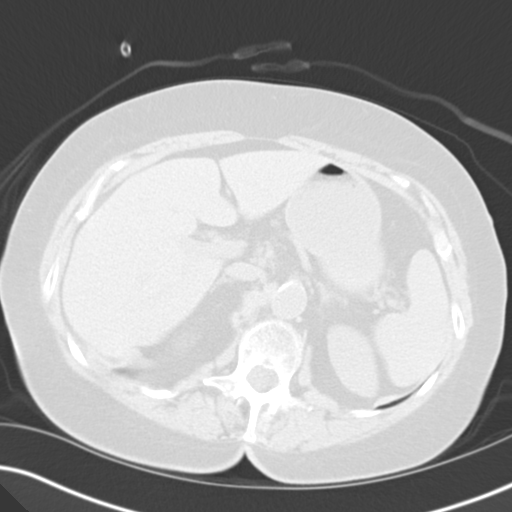
[im 16/71  lung]
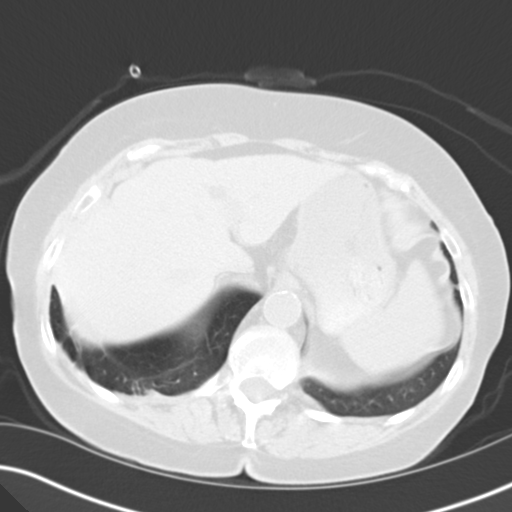
[im 24/71  lung]
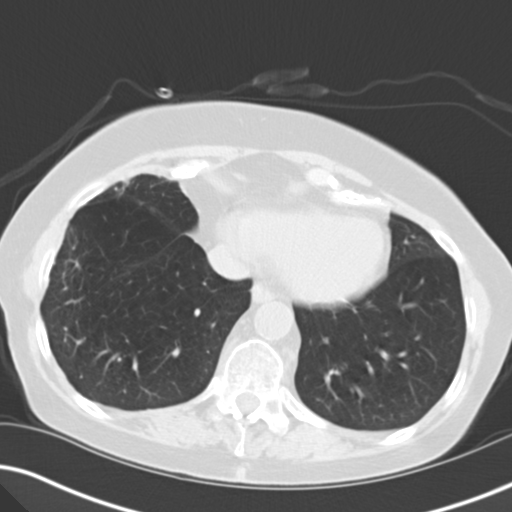
[im 29/71  mediastinal]
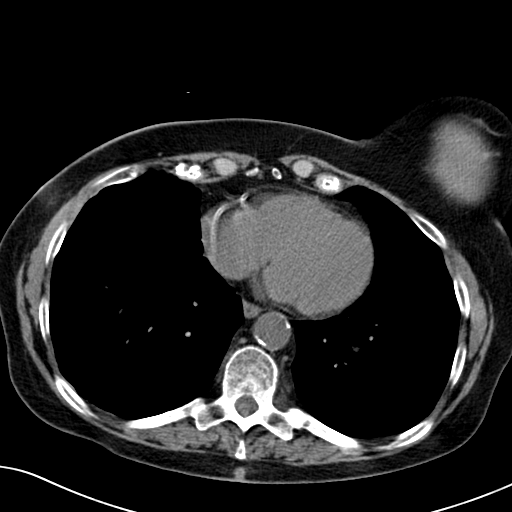
[im 29/71  lung]
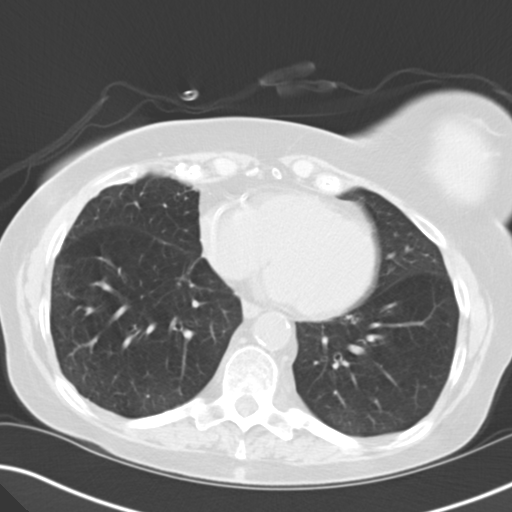
[im 37/71  lung]
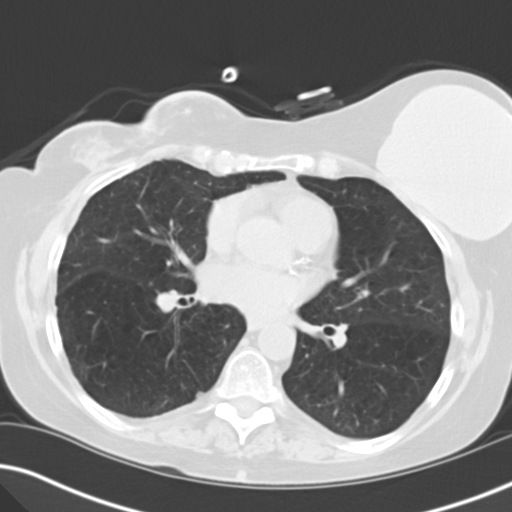
[im 42/71  lung]
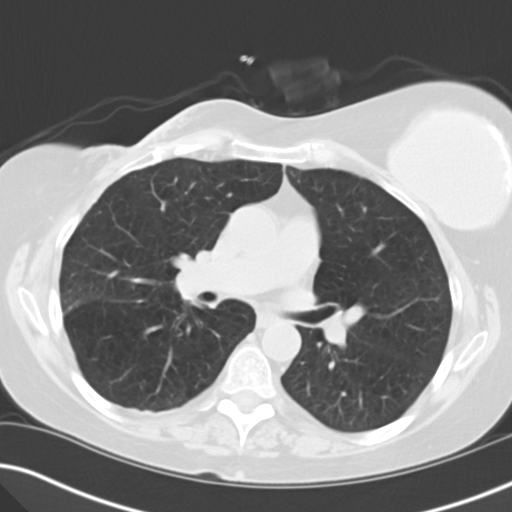
[im 47/71  lung]
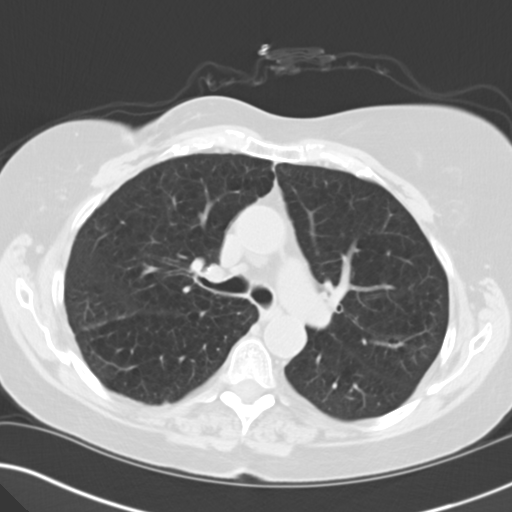
[im 55/71  mediastinal]
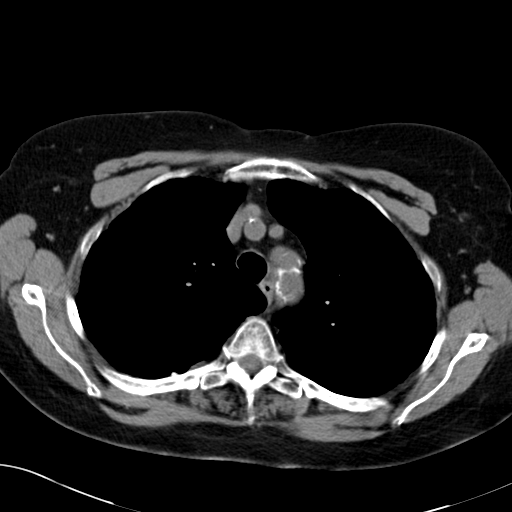
[im 55/71  lung]
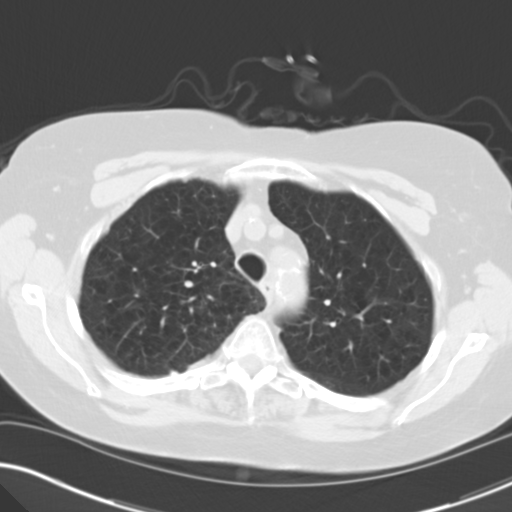
[im 60/71  lung]
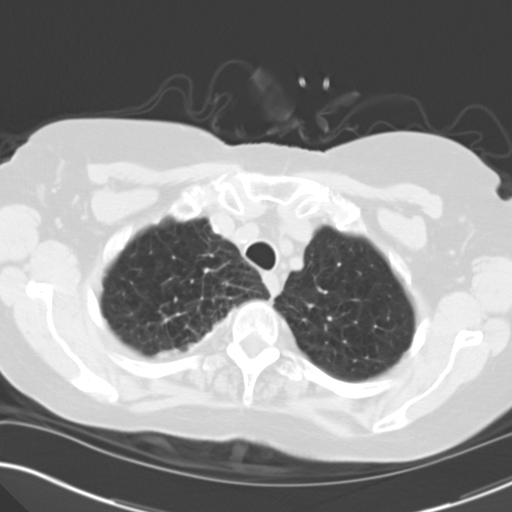
[im 65/71  lung]
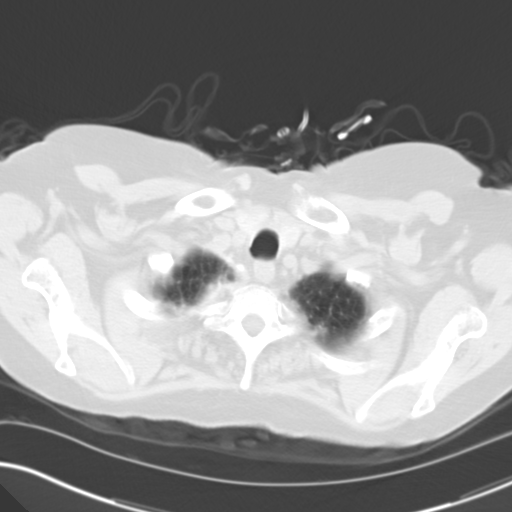

[Series 5: cor routine chest wo · coronal · 0.67mm/px · 3 of 139 slices shown]
[im 28/139  lung]
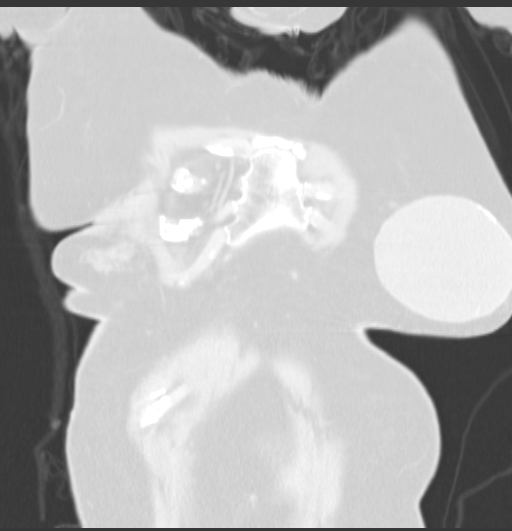
[im 56/139  lung]
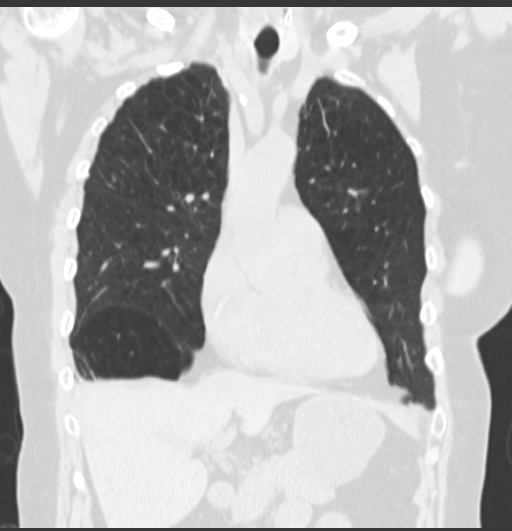
[im 83/139  lung]
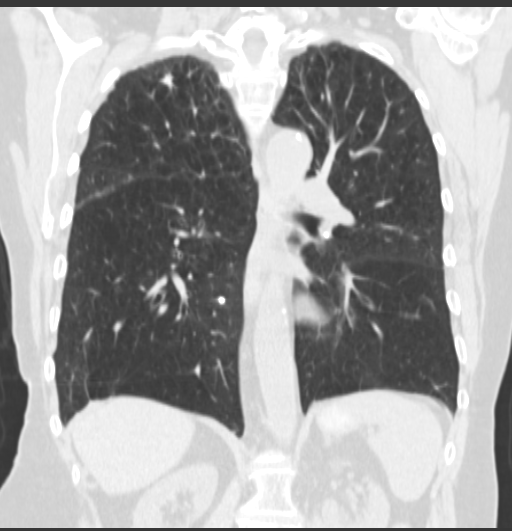

[14 of 36 positions shown; findings below may reference images not displayed]

FINDINGS: Mediastinum: Heart size is normal. There is no significant
pericardial fluid, thickening or pericardial calcification. There is
atherosclerosis of the thoracic aorta, the great vessels of the
mediastinum and the coronary arteries, including calcified
atherosclerotic plaque in the left main, left anterior descending,
left circumflex and right coronary arteries. No pathologically
enlarged mediastinal or hilar lymph nodes. Please note that accurate
exclusion of hilar adenopathy is limited on noncontrast CT scans.
Esophagus is unremarkable in appearance.

Lungs/Pleura: Mild diffuse bronchial wall thickening. Moderate
centrilobular emphysema. Previously noted cavitary area in the apex
of the right upper lobe appears contracted, now with a spiculated
high density nodule in this region measuring approximately 9 x 6 mm
(image 11 of series 4), which is favored to reflect some develop
being post infectious or inflammatory scarring. No other definite
suspicious appearing pulmonary nodules or masses are noted at this
time. In the area of the previously noted pleural-based opacity on
image 28 of series 6 of the prior examination, there is now a small
calcified pleural plaque. Several other smaller pleural
calcifications are noted in the right hemithorax. No calcified
left-sided pleural plaques are noted. No consolidative airspace
disease. No pleural effusions.

Upper Abdomen: 8 mm nonobstructive calculus in the lower pole
collecting system of the left kidney. 1.4 cm low-attenuation lesion
in segment 4A of the liver is incompletely characterized on today's
non contrast CT examination, but is unchanged compared to the prior
study, presumably a small cyst. Atherosclerosis.

Musculoskeletal: Postoperative changes of lumpectomy with
considerable postoperative scarring are noted in the right breast.
No definite soft tissue mass along the right chest wall or right
axillary adenopathy is noted to suggest recurrence of disease.
Left-sided subglandular breast implant in position. There are no
aggressive appearing lytic or blastic lesions noted in the
visualized portions of the skeleton.
IMPRESSION: 1. Today's study demonstrates probable resolving post
infectious/inflammatory changes in the right upper lobe, as detailed
above. Strictly speaking, the low-level calcification in the 6 x 9
mm nodule in the right apex which appears to have formed within the
previously noted cavity is not definitively a benign calcification,
and given the spiculated appearance of the nodule (which is common
in both post infectious/inflammatory scar and neoplasm), a follow-up
chest CT is recommended in 6 months to ensure the stability or
continued resolution of this finding.
2. Mild diffuse bronchial wall thickening with moderate
centrilobular emphysema; imaging findings suggestive of underlying
COPD.
3. Status post right lumpectomy. No definite signs of metastatic
disease in the lungs.
4. Atherosclerosis, including left main and 3 vessel coronary artery
disease. Assessment for potential risk factor modification, dietary
therapy or pharmacologic therapy may be warranted, if clinically
indicated.
5. Additional incidental findings, as above.

## 2015-06-06 ENCOUNTER — Ambulatory Visit (INDEPENDENT_AMBULATORY_CARE_PROVIDER_SITE_OTHER): Payer: Medicare Other | Admitting: Family Medicine

## 2015-06-06 ENCOUNTER — Encounter: Payer: Self-pay | Admitting: Family Medicine

## 2015-06-06 VITALS — BP 152/78 | HR 103 | Temp 98.7°F | Wt 162.2 lb

## 2015-06-06 DIAGNOSIS — J441 Chronic obstructive pulmonary disease with (acute) exacerbation: Secondary | ICD-10-CM

## 2015-06-06 MED ORDER — PREDNISONE 20 MG PO TABS: ORAL_TABLET | ORAL | Status: DC

## 2015-06-06 MED ORDER — LEVOFLOXACIN 500 MG PO TABS: 500.0000 mg | ORAL_TABLET | Freq: Every day | ORAL | Status: DC

## 2015-06-06 NOTE — Assessment & Plan Note (Signed)
D/w pt. Still okay for outpatient f/u.  Cautions given re: f/u.  Restart levaquin and pred taper with routine cautions.  She has tolerated levaquin prev.  Continue baseline inhalers.   Update me as needed.   She agrees.

## 2015-06-06 NOTE — Patient Instructions (Signed)
Prednisone taper.  3, 3, 2, 2, 1, 1, 1/2, 1/2 per day.  With food.  Restart the antibiotics and continue your inhalers.  If more shortness of breath, then go to the ER.  Take care.  Glad to see you.

## 2015-06-06 NOTE — Progress Notes (Signed)
Pre visit review using our clinic review tool, if applicable. No additional management support is needed unless otherwise documented below in the visit note.  Cough started about 5-6 days ago.  Started with more cough.  More SOB in the meantime.  More sputum, discolored, yellow green. Having to wear her O2 more.  Fever likely recently.  Dec in appetite.  No diffuse pain but chest wall is sore from coughing.  More pain with deep breath.  No vomiting, no diarrhea. No rash.  More wheeze recently.  Some relief with her current inhalers.    Meds, vitals, and allergies reviewed.   ROS: See HPI.  Otherwise, noncontributory.  nad but chronically ill appearing, at baseline. Speaking in complete sentences.   On O2, at baseline.   ncat Tm wnl Nasal exam wnl OP wnl Neck supple no LA rrr Diffuse dec in BS globally with inc in exp phase with rhonchi noted in the LUL and RLL.  Exp wheeze noted No inc in wob abd soft Ext w/o edema

## 2015-06-14 DIAGNOSIS — I509 Heart failure, unspecified: Secondary | ICD-10-CM | POA: Diagnosis not present

## 2015-06-14 DIAGNOSIS — G4733 Obstructive sleep apnea (adult) (pediatric): Secondary | ICD-10-CM | POA: Diagnosis not present

## 2015-06-14 DIAGNOSIS — J449 Chronic obstructive pulmonary disease, unspecified: Secondary | ICD-10-CM | POA: Diagnosis not present

## 2015-06-19 ENCOUNTER — Other Ambulatory Visit: Payer: Self-pay | Admitting: *Deleted

## 2015-06-19 MED ORDER — ATORVASTATIN CALCIUM 20 MG PO TABS: 20.0000 mg | ORAL_TABLET | Freq: Every day | ORAL | Status: DC

## 2015-06-23 DIAGNOSIS — J449 Chronic obstructive pulmonary disease, unspecified: Secondary | ICD-10-CM | POA: Diagnosis not present

## 2015-06-23 DIAGNOSIS — I509 Heart failure, unspecified: Secondary | ICD-10-CM | POA: Diagnosis not present

## 2015-06-23 DIAGNOSIS — G4733 Obstructive sleep apnea (adult) (pediatric): Secondary | ICD-10-CM | POA: Diagnosis not present

## 2015-06-25 ENCOUNTER — Ambulatory Visit (INDEPENDENT_AMBULATORY_CARE_PROVIDER_SITE_OTHER): Payer: Medicare Other | Admitting: *Deleted

## 2015-06-25 DIAGNOSIS — E538 Deficiency of other specified B group vitamins: Secondary | ICD-10-CM | POA: Diagnosis not present

## 2015-06-25 MED ORDER — CYANOCOBALAMIN 1000 MCG/ML IJ SOLN
1000.0000 ug | Freq: Once | INTRAMUSCULAR | Status: AC
  Administered 2015-06-25: 1000 ug via INTRAMUSCULAR

## 2015-07-06 IMAGING — CR DG CHEST 2V
1 series · 2 of 2 positions shown · non-contrast
Comparison: 12/05/2013, 02/21/2014

CLINICAL DATA: Acute productive cough, wheezing, left chest pain,
emphysema

EXAM:
CHEST - 2 VIEW

[Series 1: dxr chest pa (or ap) and lateral · 0.14mm/px · 2 of 2 slices shown]
[im 1/2]
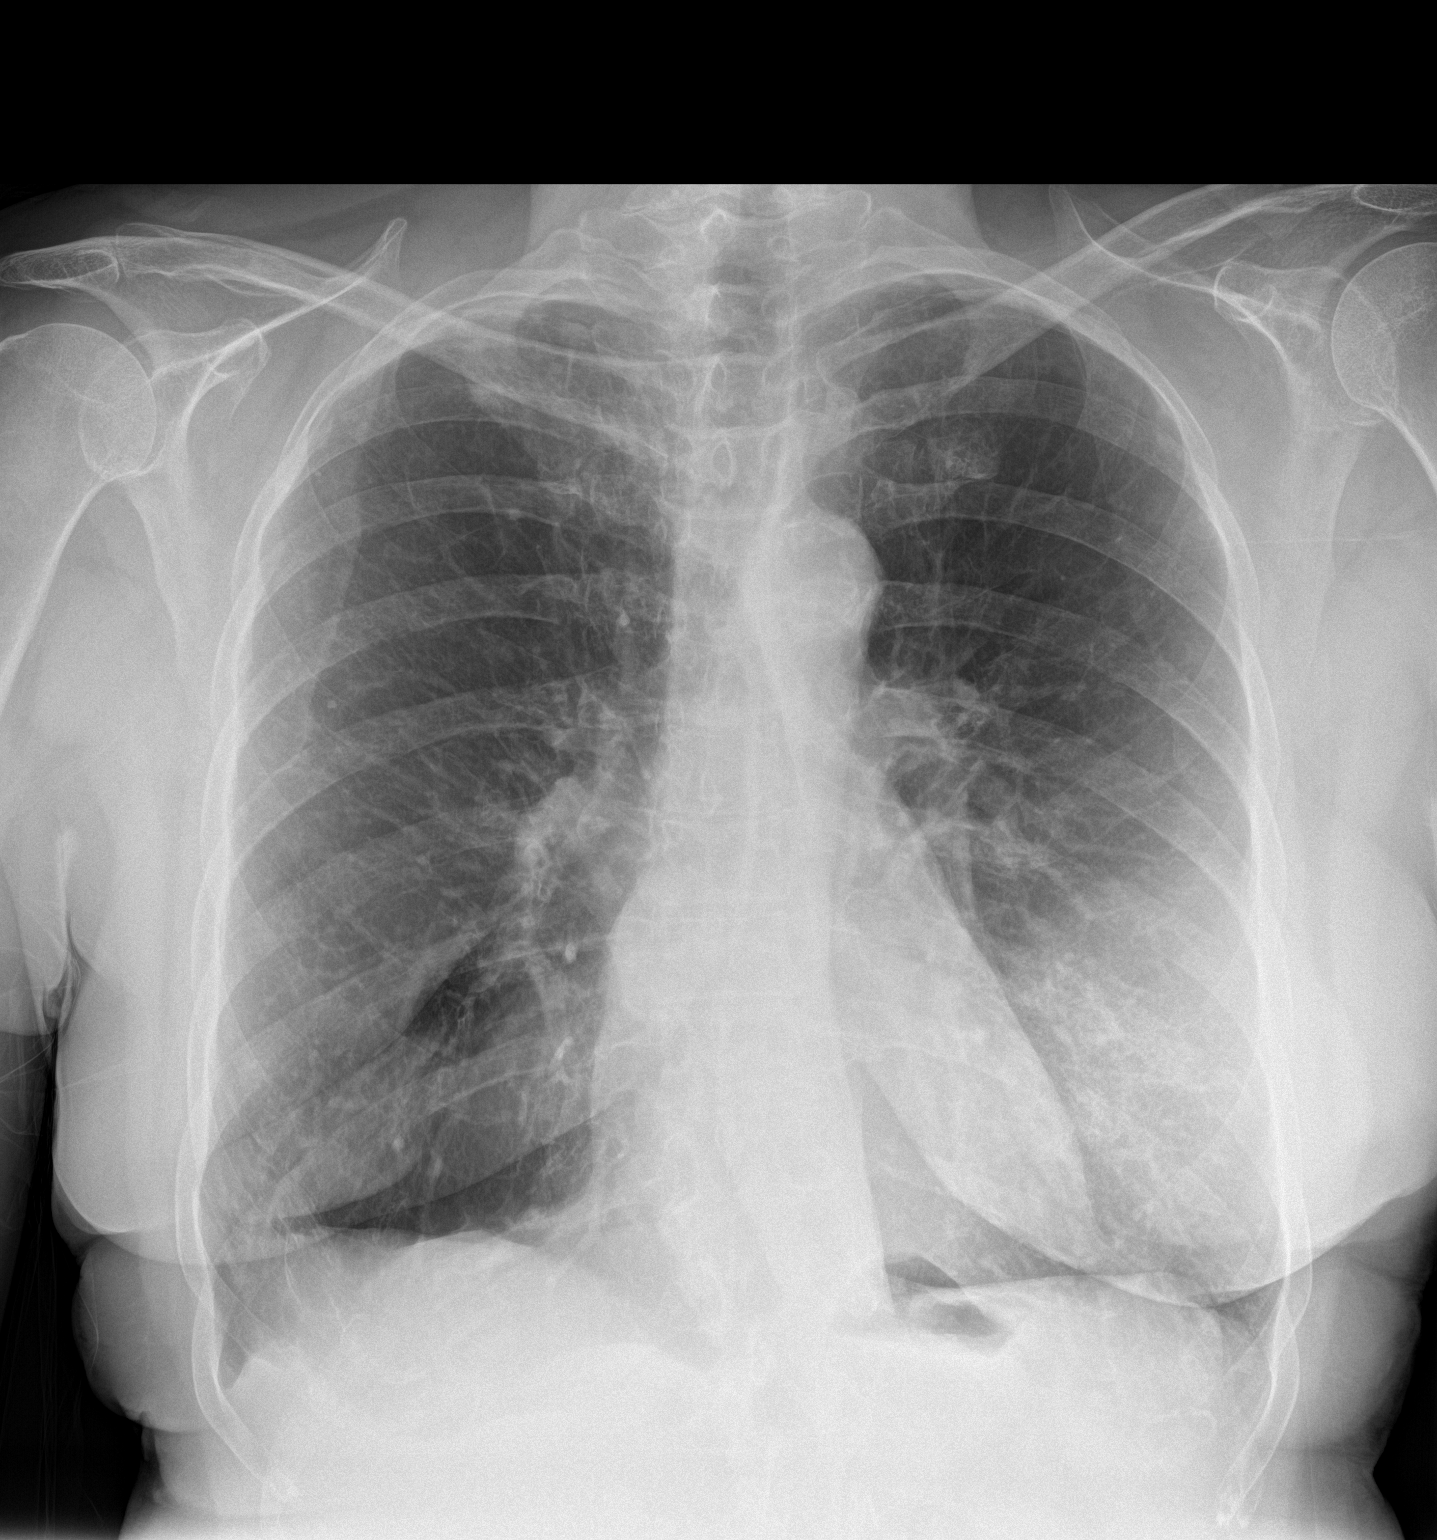
[im 2/2]
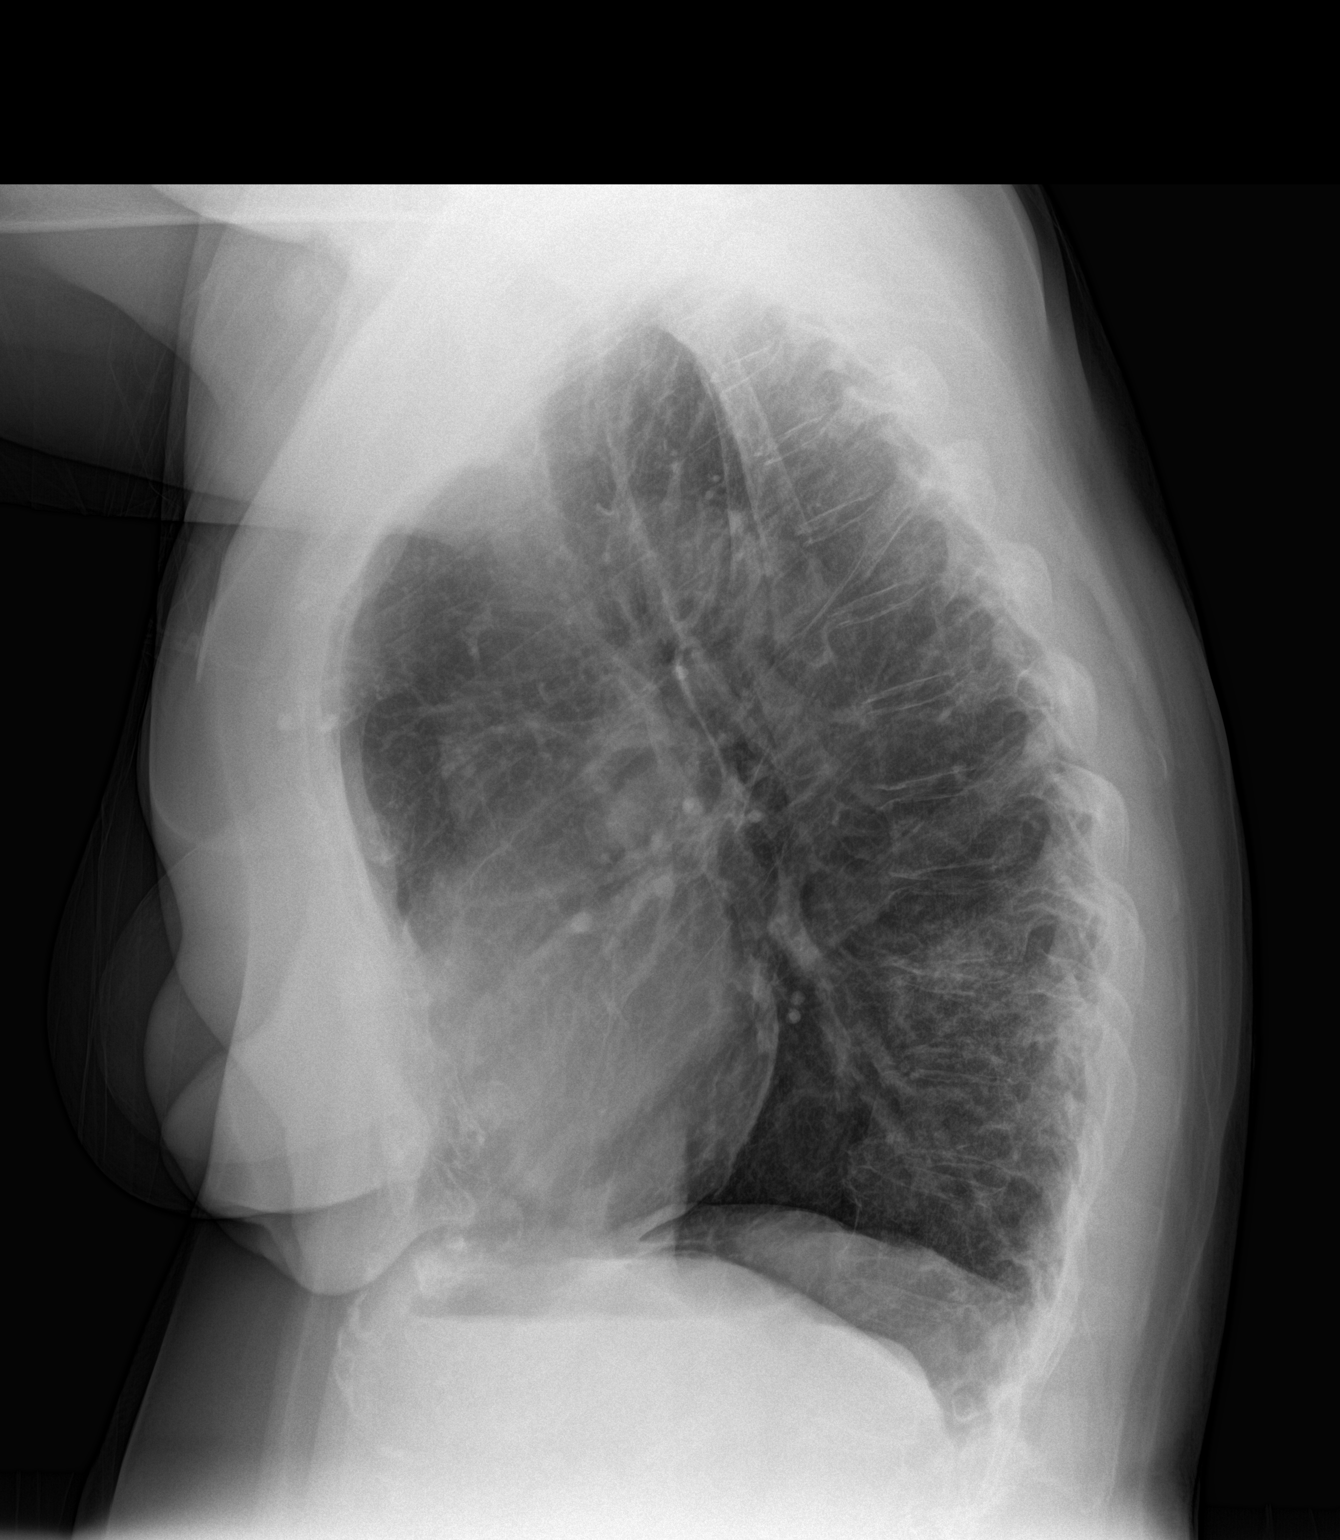

[2 of 2 positions shown; findings below may reference images not displayed]

FINDINGS: Hyperinflation noted compatible with background emphysema. Normal
heart size and vascularity. No definite focal pneumonia, collapse or
consolidation. No edema, effusion, or pneumothorax. Chronic right
base scarring. Stable right upper lobe 9 mm nodule projects over the
right first rib. Trachea is midline. Atherosclerosis noted of the
aorta.
IMPRESSION: Stable COPD/ emphysema and 9 mm right apical nodule. No superimposed
acute process by plain radiography.

## 2015-07-06 IMAGING — CT CT ABD-PELV W/ CM
2 of 5 series · 16 of 46 positions shown, 18 images · IV contrast (omnipaque)
Comparison: None.

CLINICAL DATA: 73-year-old with left lower quadrant pain. No bowel
movement for 4 days.

EXAM:
CT ABDOMEN AND PELVIS WITH CONTRAST
TECHNIQUE: Multidetector CT imaging of the abdomen and pelvis was performed
using the standard protocol following bolus administration of
intravenous contrast.
CONTRAST:  85 mL Omnipaque 300

[Series 2: routine abd pel with · axial · 0.66mm/px · z∈[-485,-140]mm · 13 of 79 slices shown, 15 images]
[im 5/79  soft-tissue]
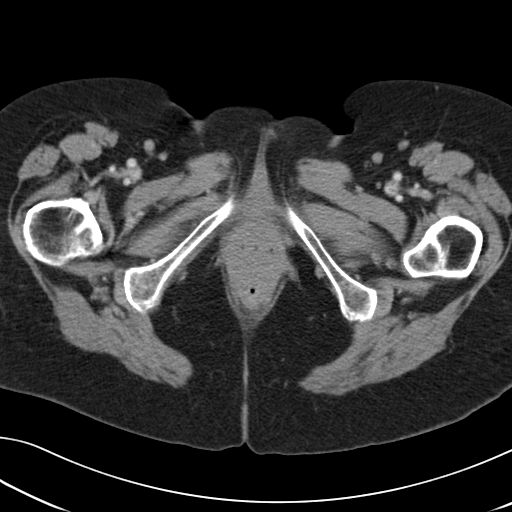
[im 5/79  bone]
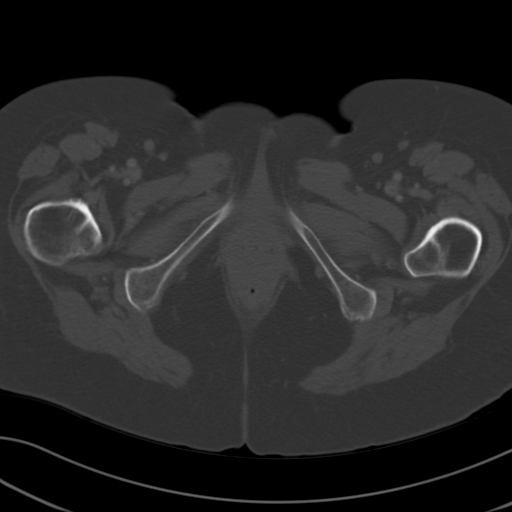
[im 13/79  soft-tissue]
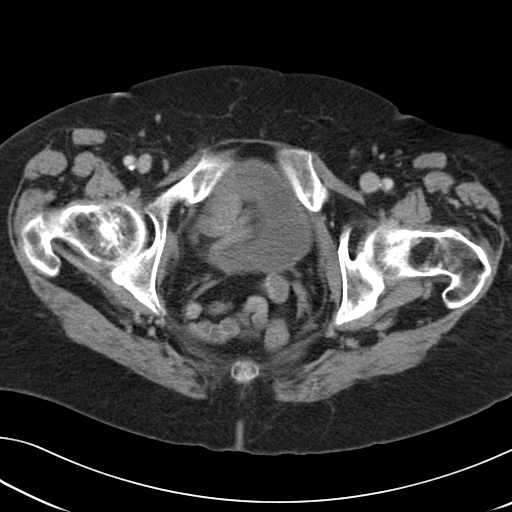
[im 17/79  soft-tissue]
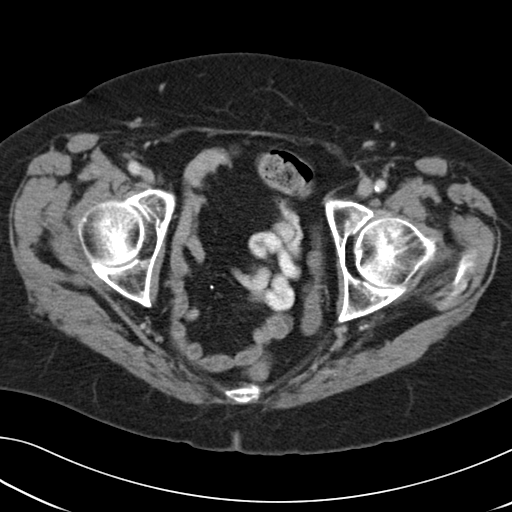
[im 21/79  soft-tissue]
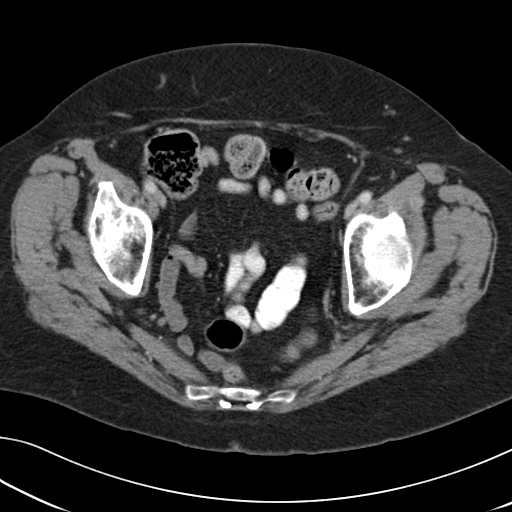
[im 29/79  soft-tissue]
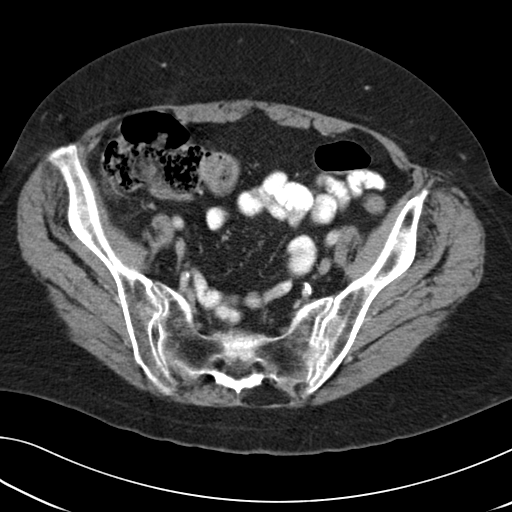
[im 33/79  soft-tissue]
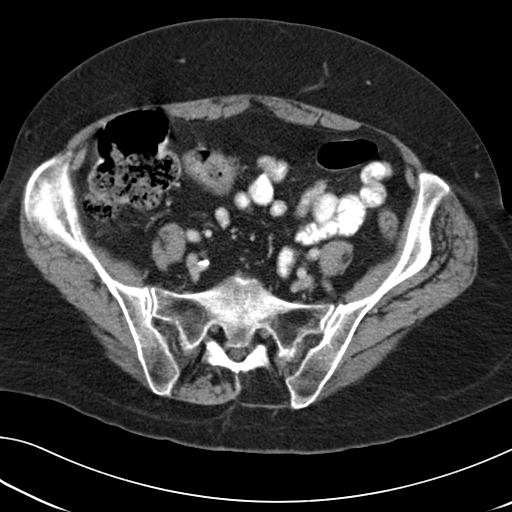
[im 42/79  soft-tissue]
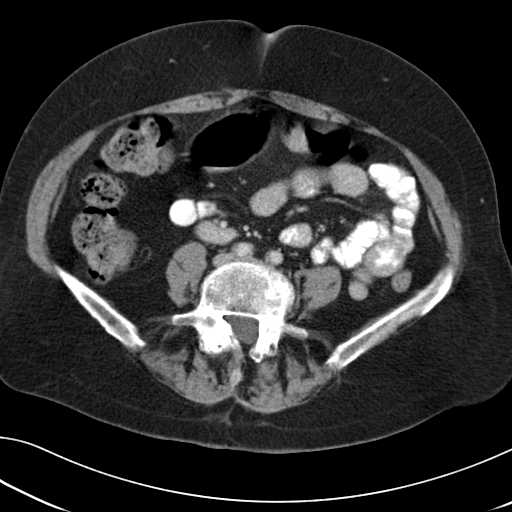
[im 46/79  soft-tissue]
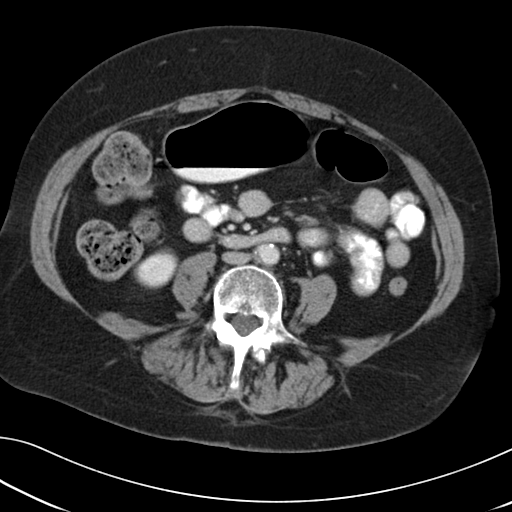
[im 50/79  soft-tissue]
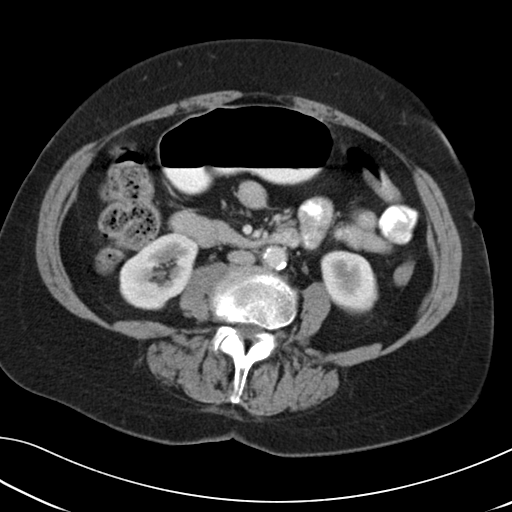
[im 50/79  bone]
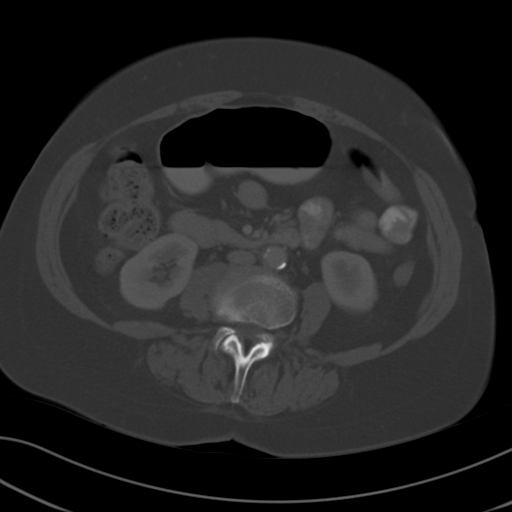
[im 58/79  soft-tissue]
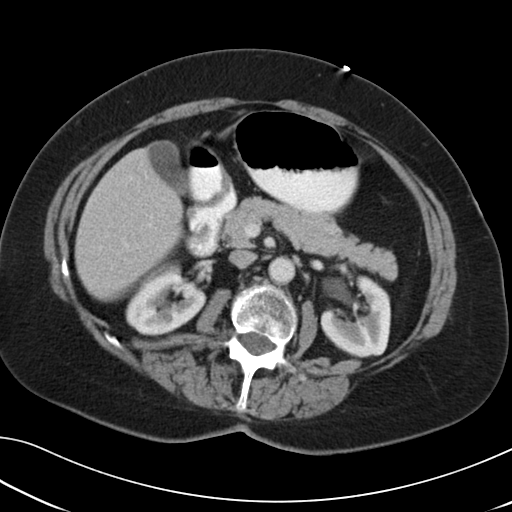
[im 62/79  soft-tissue]
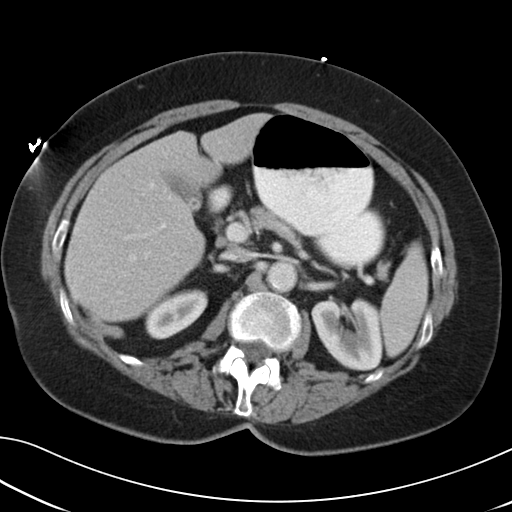
[im 66/79  soft-tissue]
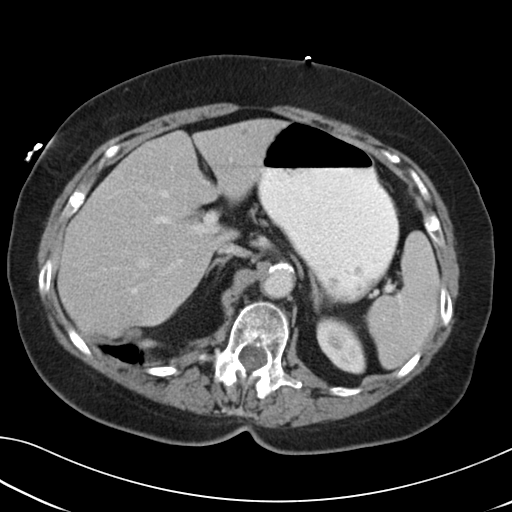
[im 74/79  soft-tissue]
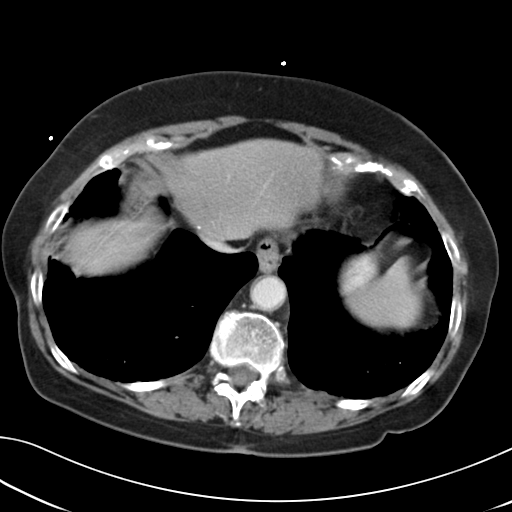

[Series 5: cor routine abd pel with · coronal · 0.67mm/px · 3 of 134 slices shown]
[im 45/134  soft-tissue]
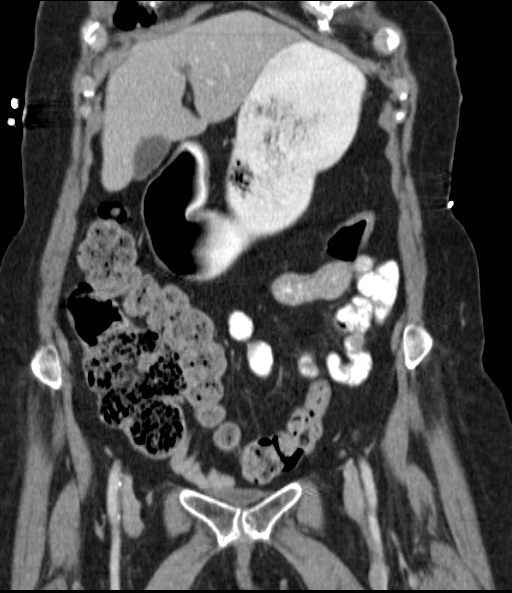
[im 60/134  soft-tissue]
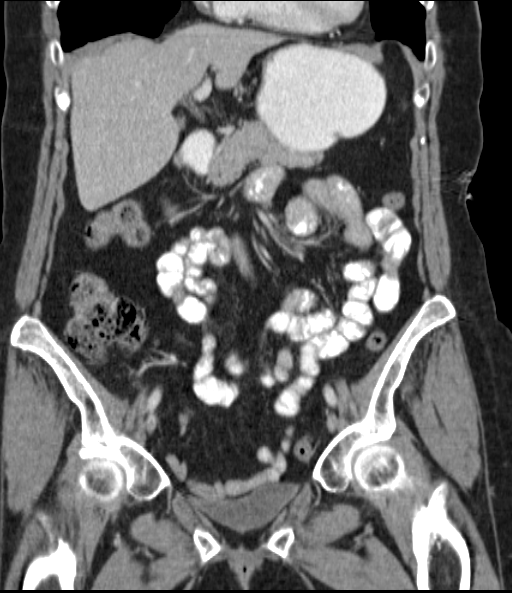
[im 74/134  soft-tissue]
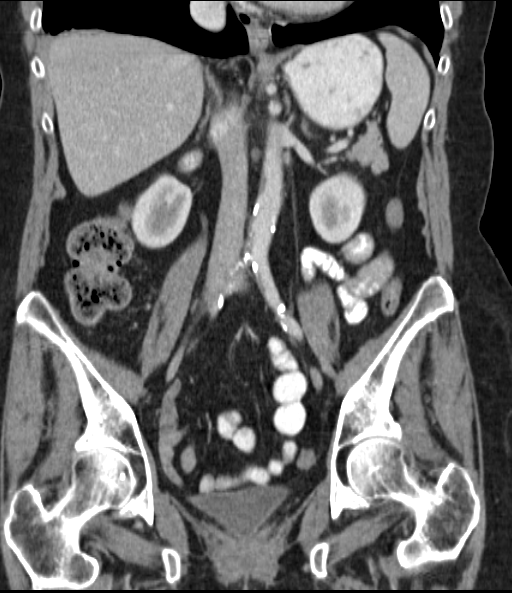

[16 of 46 positions shown; findings below may reference images not displayed]

FINDINGS: Peripheral parenchymal densities in the left lower lobe. Findings
could represent atelectasis versus a small focus of infection or
aspiration. No acute findings at the right lung base. No evidence
for free air.

Again noted is a hypodense structure or cyst in the left hepatic
lobe measuring roughly 1.4 cm. Portal venous system is patent. No
gross abnormality to the gallbladder. There may be additional
punctate low-density cysts in the left hepatic lobe.

Normal appearance of the spleen, pancreas, right kidney and adrenal
glands. There are at least 2 left kidney stones and the largest
measures 9 mm in the mid/lower pole region. No evidence for
hydronephrosis. Normal appearance of the urinary bladder. Uterus has
been removed.

Normal appearance of the duodenum and small bowel. Atherosclerotic
disease in the abdominal aorta without aneurysm. No significant free
fluid or lymphadenopathy.

There appears to be residual adnexal tissue in the left hemipelvis
which is unchanged. There is stool in the right colon. No acute
abnormality involving the appendix. No acute inflammatory changes
within the abdomen or pelvis.

No acute bone abnormality. Mild scoliosis in the thoracolumbar
spine.
IMPRESSION: Left kidney stones without hydronephrosis.

Small hypodensities in the liver are likely related to cysts.

Small amount of parenchymal disease at the left lung base is
nonspecific. Findings could represent atelectasis but a small focus
of infection cannot be completely excluded.

## 2015-07-08 ENCOUNTER — Other Ambulatory Visit: Payer: Self-pay | Admitting: *Deleted

## 2015-07-08 MED ORDER — METOPROLOL SUCCINATE ER 50 MG PO TB24: ORAL_TABLET | ORAL | Status: DC

## 2015-07-15 DIAGNOSIS — J449 Chronic obstructive pulmonary disease, unspecified: Secondary | ICD-10-CM | POA: Diagnosis not present

## 2015-07-15 DIAGNOSIS — I509 Heart failure, unspecified: Secondary | ICD-10-CM | POA: Diagnosis not present

## 2015-07-15 DIAGNOSIS — G4733 Obstructive sleep apnea (adult) (pediatric): Secondary | ICD-10-CM | POA: Diagnosis not present

## 2015-07-23 ENCOUNTER — Ambulatory Visit (INDEPENDENT_AMBULATORY_CARE_PROVIDER_SITE_OTHER): Payer: Medicare Other | Admitting: *Deleted

## 2015-07-23 DIAGNOSIS — E538 Deficiency of other specified B group vitamins: Secondary | ICD-10-CM | POA: Diagnosis not present

## 2015-07-23 MED ORDER — CYANOCOBALAMIN 1000 MCG/ML IJ SOLN
1000.0000 ug | Freq: Once | INTRAMUSCULAR | Status: AC
  Administered 2015-07-23: 1000 ug via INTRAMUSCULAR

## 2015-07-24 DIAGNOSIS — I509 Heart failure, unspecified: Secondary | ICD-10-CM | POA: Diagnosis not present

## 2015-07-24 DIAGNOSIS — J449 Chronic obstructive pulmonary disease, unspecified: Secondary | ICD-10-CM | POA: Diagnosis not present

## 2015-07-24 DIAGNOSIS — G4733 Obstructive sleep apnea (adult) (pediatric): Secondary | ICD-10-CM | POA: Diagnosis not present

## 2015-08-05 DIAGNOSIS — Z961 Presence of intraocular lens: Secondary | ICD-10-CM | POA: Diagnosis not present

## 2015-08-12 ENCOUNTER — Other Ambulatory Visit: Payer: Self-pay | Admitting: *Deleted

## 2015-08-12 MED ORDER — ALPRAZOLAM 0.5 MG PO TABS: 0.5000 mg | ORAL_TABLET | Freq: Three times a day (TID) | ORAL | Status: DC | PRN

## 2015-08-12 NOTE — Telephone Encounter (Signed)
Faxed refill request. Last Filled:    90 tablet 2 05/16/2015  Please advise.

## 2015-08-12 NOTE — Telephone Encounter (Signed)
Please call in.  Thanks.   

## 2015-08-13 ENCOUNTER — Other Ambulatory Visit: Payer: Self-pay

## 2015-08-13 MED ORDER — ALBUTEROL SULFATE HFA 108 (90 BASE) MCG/ACT IN AERS: INHALATION_SPRAY | RESPIRATORY_TRACT | Status: DC

## 2015-08-13 NOTE — Telephone Encounter (Signed)
Medication phoned to pharmacy.  

## 2015-08-14 DIAGNOSIS — G4733 Obstructive sleep apnea (adult) (pediatric): Secondary | ICD-10-CM | POA: Diagnosis not present

## 2015-08-14 DIAGNOSIS — I509 Heart failure, unspecified: Secondary | ICD-10-CM | POA: Diagnosis not present

## 2015-08-14 DIAGNOSIS — J449 Chronic obstructive pulmonary disease, unspecified: Secondary | ICD-10-CM | POA: Diagnosis not present

## 2015-08-23 ENCOUNTER — Ambulatory Visit (INDEPENDENT_AMBULATORY_CARE_PROVIDER_SITE_OTHER): Payer: Medicare Other | Admitting: *Deleted

## 2015-08-23 DIAGNOSIS — G4733 Obstructive sleep apnea (adult) (pediatric): Secondary | ICD-10-CM | POA: Diagnosis not present

## 2015-08-23 DIAGNOSIS — E538 Deficiency of other specified B group vitamins: Secondary | ICD-10-CM

## 2015-08-23 DIAGNOSIS — I509 Heart failure, unspecified: Secondary | ICD-10-CM | POA: Diagnosis not present

## 2015-08-23 DIAGNOSIS — J449 Chronic obstructive pulmonary disease, unspecified: Secondary | ICD-10-CM | POA: Diagnosis not present

## 2015-08-23 MED ORDER — CYANOCOBALAMIN 1000 MCG/ML IJ SOLN
1000.0000 ug | Freq: Once | INTRAMUSCULAR | Status: AC
  Administered 2015-08-23: 1000 ug via INTRAMUSCULAR

## 2015-08-28 DIAGNOSIS — D692 Other nonthrombocytopenic purpura: Secondary | ICD-10-CM | POA: Diagnosis not present

## 2015-08-28 DIAGNOSIS — L821 Other seborrheic keratosis: Secondary | ICD-10-CM | POA: Diagnosis not present

## 2015-08-28 DIAGNOSIS — L820 Inflamed seborrheic keratosis: Secondary | ICD-10-CM | POA: Diagnosis not present

## 2015-08-28 DIAGNOSIS — L570 Actinic keratosis: Secondary | ICD-10-CM | POA: Diagnosis not present

## 2015-09-05 ENCOUNTER — Other Ambulatory Visit: Payer: Self-pay | Admitting: Specialist

## 2015-09-05 DIAGNOSIS — R05 Cough: Secondary | ICD-10-CM | POA: Diagnosis not present

## 2015-09-05 DIAGNOSIS — R911 Solitary pulmonary nodule: Secondary | ICD-10-CM

## 2015-09-05 DIAGNOSIS — R0789 Other chest pain: Secondary | ICD-10-CM | POA: Diagnosis not present

## 2015-09-05 DIAGNOSIS — J410 Simple chronic bronchitis: Secondary | ICD-10-CM | POA: Diagnosis not present

## 2015-09-10 ENCOUNTER — Ambulatory Visit
Admission: RE | Admit: 2015-09-10 | Discharge: 2015-09-10 | Disposition: A | Discharge status: Home / Self Care | Payer: Medicare Other | Source: Ambulatory Visit | Attending: Specialist | Admitting: Specialist

## 2015-09-10 DIAGNOSIS — R918 Other nonspecific abnormal finding of lung field: Secondary | ICD-10-CM | POA: Diagnosis not present

## 2015-09-10 DIAGNOSIS — I2510 Atherosclerotic heart disease of native coronary artery without angina pectoris: Secondary | ICD-10-CM | POA: Insufficient documentation

## 2015-09-10 DIAGNOSIS — R911 Solitary pulmonary nodule: Secondary | ICD-10-CM | POA: Diagnosis not present

## 2015-09-10 DIAGNOSIS — J432 Centrilobular emphysema: Secondary | ICD-10-CM | POA: Insufficient documentation

## 2015-09-11 DIAGNOSIS — J410 Simple chronic bronchitis: Secondary | ICD-10-CM | POA: Diagnosis not present

## 2015-09-11 DIAGNOSIS — I2510 Atherosclerotic heart disease of native coronary artery without angina pectoris: Secondary | ICD-10-CM | POA: Diagnosis not present

## 2015-09-11 DIAGNOSIS — I214 Non-ST elevation (NSTEMI) myocardial infarction: Secondary | ICD-10-CM | POA: Diagnosis not present

## 2015-09-11 DIAGNOSIS — I10 Essential (primary) hypertension: Secondary | ICD-10-CM | POA: Diagnosis not present

## 2015-09-11 DIAGNOSIS — I493 Ventricular premature depolarization: Secondary | ICD-10-CM | POA: Diagnosis not present

## 2015-09-13 ENCOUNTER — Encounter: Payer: Self-pay | Admitting: Urology

## 2015-09-13 ENCOUNTER — Other Ambulatory Visit: Payer: Self-pay | Admitting: Family Medicine

## 2015-09-13 ENCOUNTER — Ambulatory Visit (INDEPENDENT_AMBULATORY_CARE_PROVIDER_SITE_OTHER): Payer: Medicare Other | Admitting: Urology

## 2015-09-13 VITALS — BP 188/78 | HR 96 | Ht 67.0 in | Wt 158.0 lb

## 2015-09-13 DIAGNOSIS — N200 Calculus of kidney: Secondary | ICD-10-CM

## 2015-09-13 DIAGNOSIS — D4112 Neoplasm of uncertain behavior of left renal pelvis: Secondary | ICD-10-CM

## 2015-09-13 LAB — URINALYSIS, COMPLETE
BILIRUBIN UA: NEGATIVE
GLUCOSE, UA: NEGATIVE
Ketones, UA: NEGATIVE
NITRITE UA: NEGATIVE
PH UA: 5.5 (ref 5.0–7.5)
PROTEIN UA: NEGATIVE
Specific Gravity, UA: 1.01 (ref 1.005–1.030)
UUROB: 0.2 mg/dL (ref 0.2–1.0)

## 2015-09-13 LAB — MICROSCOPIC EXAMINATION: Bacteria, UA: NONE SEEN

## 2015-09-13 NOTE — Progress Notes (Signed)
10:05 AM  09/13/2015   Mckenzie Mclaughlin [birth year] [id number]  Referring provider: Tonia Ghent, MD [address] [place], [place] [address]  Chief Complaint  Patient presents with  . Nephrolithiasis    10month follow up    HPI:  75 yo with multiple GU issues  Gross hematuria Patient states she had her first episode of painless gross hematuria x single episode 01/2015.   Hematuria work up 12/16 including CT urogram (as below) and cystoscopy (negative)  Extensive smoking history, 1ppd x 45 years, quit 6-7 year ago.   Persistent microscopic hematuria today on UA  Kidney stones No flank pain since last visit Never previously passed stones or required surgical intervention. Asymptomatic 8.8 mm left renal pelvis stone along with other much smaller non obstructing lower pole stones on CT Urogram 02/2015. Multiple medical comorbidities including 02 depend/ CAD  Left renal pelvic abnormality Incidental urothelial enhancement and mild fullness of the left renal upper tract collecting system, suspect chronic inflammation from stone.   Urine cytology negative 02/2015 No flank pain   PMH: Past Medical History  Diagnosis Date  . COPD, severe 07/29/98  . Coronary artery disease 01/22/99    Non Q-wave MI, stent RCA  . Hypertension     03/30/00  . History of ETT 08/03/09    Myoview Normal  . History of ETT 05/1999    Cardiolite pos ETT, neg Cardiolite  . Hx of cardiac catheterization 08/29/01    60% RCA 0/w 20-30% lesions  . Hx of cardiac catheterization 01/22/99    w/Stent 90% RCA lesion  . History of ETT 09/02/01    Normal  . History of ETT 04/28/2006    Myoview normal EF 83%  . History of CT scan of head 08/02/09    w/o mild age appropr atrophy  . History of blurry vision 05/06-05/10/2009    Hospital [facility] CP R/O'D Blurry vision, smoking  . History of ETT 04/10/11    normal EF and no ischemia per Dr. Saralyn Pilar  . COPD (chronic obstructive pulmonary disease)  ([place])   . Anxiety   . Shortness of breath   . Pneumothorax 2/14  . Esophageal dilatation     Pt needs appple sauce to take meds.  . Esophageal stricture     PT needs apple sauce to take meds  . GERD (gastroesophageal reflux disease) 08/29/98    severe-no meds now  . On home oxygen therapy     as needed-has a travel tank  . Complication of anesthesia   . PONV (postoperative nausea and vomiting)     ad breast remocved and see was under anesthesia a long time  . Anginal pain [facility])     see dr [name]  "Spams"  . Dysrhythmia     Palpations at times. last time 10/29 ish 2014  . Mental disorder   . Depression   . Head injury, acute, with loss of consciousness ([facility]) 11/2012    unsure how long  . Constipation   . History of blood transfusion   . Status post aortic coarctation stent placement   . Arthritis   . Depression   . Cancer (H[facility])     side of head- skin cancer, squamous cell ca on left foot.    Surgical History: Past Surgical History  Procedure Laterality Date  . Laminectomy  1976    Disc Removal X 2 Lumbar  . Esophageal dilation  06/00    EGD  . Oophorectomy    .  Abdominal hysterectomy    . Colonoscopy    . Mastectomy  03/1976    Bilateral due FCBD with implants [facility])  . Carotid stent Right 2000  . Back surgery    . Tissue expander placement Right 02/03/2013    Procedure: REMOVAL OF RIGHT BREAST IMPLANT AND IMPLANT MATERIAL  CAPSULECTOMY;  Surgeon: Irene Limbo, MD;  Location: [facility];  Service: Plastics;  Laterality: Right;  . Breast surgery      implant removal, R breast  . Cataract extraction Bilateral     2016    Home Medications:    Medication List       This list is accurate as of: 09/13/15 10:05 AM.  Always use your most recent med list.               albuterol 108 (90 Base) MCG/ACT inhaler  Commonly known as:  PROAIR HFA  INHALE TWO PUFFS BY MOUTH EVERY 6 HOURS AS NEEDED     ALPRAZolam 0.5 MG tablet  Commonly known as:  XANAX  Take  1 tablet (0.5 mg total) by mouth 3 (three) times daily as needed.     aspirin EC 81 MG tablet  Take 81 mg by mouth daily.     atorvastatin 20 MG tablet  Commonly known as:  LIPITOR  Take 1 tablet (20 mg total) by mouth daily.     budesonide-formoterol 160-4.5 MCG/ACT inhaler  Commonly known as:  SYMBICORT  Inhale 2 puffs into the lungs 2 (two) times daily.     clopidogrel 75 MG tablet  Commonly known as:  PLAVIX  Take 75 mg by mouth daily.     cyanocobalamin 1000 MCG/ML injection  Commonly known as:  (VITAMIN B-12)  Inject 1 mL (1,000 mcg total) into the muscle every 30 (thirty) days.     furosemide 20 MG tablet  Commonly known as:  LASIX  Take 20 mg by mouth daily.     metoprolol tartrate 25 MG tablet  Commonly known as:  LOPRESSOR  Take 25 mg by mouth.     nitroGLYCERIN 0.4 MG SL tablet  Commonly known as:  NITROSTAT  Place 1 tablet (0.4 mg total) under the tongue every 5 (five) minutes as needed for chest pain (max 3 doses in 15 min).     NON FORMULARY  as needed. 2-3  Liters as needed     polyethylene glycol powder powder  Commonly known as:  GLYCOLAX/MIRALAX  Take by mouth.     SPIRIVA HANDIHALER 18 MCG inhalation capsule  Generic drug:  tiotropium  INHALE ONE DOSE ONCE DAILY     venlafaxine 37.5 MG tablet  Commonly known as:  EFFEXOR  Take 1 tablet (37.5 mg total) by mouth 2 (two) times daily.        Allergies:  Allergies  Allergen Reactions  . Amoxicillin Shortness Of Breath  . Doxycycline Nausea Only and Other (See Comments)    Dizzy Reaction:  Dizziness   . Sertraline Hcl Nausea And Vomiting    REACTION: trembling    Family History: Family History  Problem Relation Age of Onset  . Stroke Mother   . Heart disease Mother     MI  . Cancer Father     Lung  . COPD Brother   . Cancer Brother     Lung with mets 2011  . Heart disease Brother     CAD  . Heart disease Sister     cirrhosis due heart disease  . Cirrhosis  Sister   . Colon  cancer Neg Hx   . Breast cancer Neg Hx     Social History:  reports that she quit smoking about 3 years ago. Her smoking use included Cigarettes. She has a 20 pack-year smoking history. She has never used smokeless tobacco. She reports that she does not drink alcohol or use illicit drugs.   Physical Exam: BP 188/78 mmHg  Pulse 96  Ht 5\' 7"  (1.702 m)  Wt 158 lb (71.668 kg)  BMI 24.74 kg/m2  Constitutional: Well nourished. Alert and oriented, No acute distress. HEENT: [place] AT, moist mucus membranes. Trachea midline, no masses. Cardiovascular: No clubbing, cyanosis, or edema. Respiratory: Normal respiratory effort, mild increased WOB, wearning 02. GI: Abdomen is soft, non tender, non distended, no abdominal masses.  GU: No CVA tenderness.  Skin: No rashes, bruises or suspicious lesions. Neurologic: Grossly intact, no focal deficits, moving all 4 extremities. Psychiatric: Normal mood and affect.  Laboratory Data: Lab Results  Component Value Date   WBC 10.4 11/12/2014   HGB 13.7 11/12/2014   HCT 42.5 11/12/2014   MCV 87.6 11/12/2014   PLT 239 11/12/2014    Lab Results  Component Value Date   CREATININE 0.80 03/12/2015    Lab Results  Component Value Date   HGBA1C 5.2 04/21/2011    Urinalysis Results for orders placed or performed in visit on 09/13/15  Microscopic Examination  Result Value Ref Range   WBC, UA 0-5 0 -  5 /hpf   RBC, UA >30 (A) 0 -  2 /hpf   Epithelial Cells (non renal) 0-10 0 - 10 /hpf   Mucus, UA Present (A) Not Estab.   Bacteria, UA None seen None seen/Few  Urinalysis, Complete  Result Value Ref Range   Specific Gravity, UA 1.010 1.005 - 1.030   pH, UA 5.5 5.0 - 7.5   Color, UA Yellow Yellow   Appearance Ur Cloudy (A) Clear   Leukocytes, UA Trace (A) Negative   Protein, UA Negative Negative/Trace   Glucose, UA Negative Negative   Ketones, UA Negative Negative   RBC, UA 3+ (A) Negative   Bilirubin, UA Negative Negative   Urobilinogen, Ur 0.2  0.2 - 1.0 mg/dL   Nitrite, UA Negative Negative   Microscopic Examination See below:     Pertinent Imaging: CLINICAL DATA: 75 year old female with history of gross hematuria for the past 2 months. Burning during urination. History of kidney stones.  EXAM: CT ABDOMEN AND PELVIS WITHOUT AND WITH CONTRAST  TECHNIQUE: Multidetector CT imaging of the abdomen and pelvis was performed following the standard protocol before and following the bolus administration of intravenous contrast.  CONTRAST: 130mL OMNIPAQUE IOHEXOL 300 MG/ML SOLN  COMPARISON: CT the abdomen and pelvis 11/12/2014.  FINDINGS: Lower chest: Mild scarring in the right lung base. Atherosclerotic calcifications in the right coronary artery.  Hepatobiliary: Multiple small low-attenuation liver lesions, unchanged in size, number and distribution compared to prior studies. The largest of these measures 1.5 cm and is compatible with a simple cyst. The smaller lesions are too small to characterize, but also likely represent tiny benign lesions such as cysts. No suspicious hepatic lesions. No intra or extrahepatic biliary ductal dilatation. Gallbladder is unremarkable in appearance.  Pancreas: No pancreatic mass. No pancreatic ductal dilatation. No pancreatic or peripancreatic fluid or inflammatory changes.  Spleen: Unremarkable.  Adrenals/Urinary Tract: 3 nonobstructive calculi are present within the interpolar and lower pole collecting system of the left kidney, largest of which measures 9 mm. No  additional calculi are identified within the right renal collecting system, along the course of either ureter, or within the lumen of the urinary bladder. Fullness of the left renal pelvis and left renal collecting system, without frank hydronephrosis. This is also associated with some enhancement of the urothelium throughout the left renal pelvis and left renal collecting system. Sub cm low-attenuation lesion  in the posterior aspect of the interpolar region of the right kidney. No other suspicious renal lesions are noted. On delayed images, there are no filling defects within the collecting system of either kidney, along the well opacified portions of either ureter (the distal third of the right ureter is incompletely opacified), or within the lumen of the urinary bladder to strongly suggest the presence of a urothelial neoplasm at this time. Urinary bladder is normal in appearance. Bilateral adrenal glands are normal in appearance.  Stomach/Bowel: Normal appearance of the stomach. No pathologic dilatation of small bowel or colon. Normal appendix.  Vascular/Lymphatic: Atherosclerosis throughout the abdominal and pelvic vasculature, without evidence of aneurysm and dissection. No lymphadenopathy noted in the abdomen or pelvis.  Reproductive: Status post hysterectomy. Ovaries are not confidently identified, and may be surgically absent or atrophic.  Other: No significant volume of ascites. No pneumoperitoneum.  Musculoskeletal: There are no aggressive appearing lytic or blastic lesions noted in the visualized portions of the skeleton.  IMPRESSION: 1. Several nonobstructive calculi are present within the left renal collecting system. No ureteral stones or findings to suggest urinary tract obstruction are noted at this time. 2. However, there is mild prominence of the left renal pelvis, as well as avid enhancement of the urothelium in the left renal pelvis and left renal collecting system, which is favored to be related to chronic inflammation from the indwelling stones. The possibility of upper tract infection is not excluded, however, there are no overt imaging findings on today's examination to suggest pyelonephritis. Correlation with urinalysis and urine cytology is recommended. 3. Atherosclerosis. 4. Additional incidental findings, as above.   Electronically Signed  By:  Vinnie Langton M.D.  On: 03/12/2015 13:43     Assessment & Plan:   1. Gross hematuria/ persistent microscopic hematuria Plan for repeat CT urogram as below - Urinalysis, Complete - ciprofloxacin (CIPRO) tablet 500 mg; Take 1 tablet (500 mg total) by mouth once. - lidocaine (XYLOCAINE) 2 % jelly 1 application; Place 1 application into the urethra once.  2. Kidney stone on left side Asymptomatic 8.8 mm left renal pelvis stone along with other much smaller non obstructing lower pole stones.  Asymptomatic.  Review of previous imaging reveal no significant interval growth Reassess on repeat imaging ordered today.    3. 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   Continued persistent microscopic hematuria, recommend repeating CT urogram to assess for resolution of urothelial enhancement.  Return in about 4 weeks (around 10/11/2015) for f/u CT urogram.  Hollice Espy, MD  [facility] [address], [place], [place] [address] [phone]

## 2015-09-14 DIAGNOSIS — G4733 Obstructive sleep apnea (adult) (pediatric): Secondary | ICD-10-CM | POA: Diagnosis not present

## 2015-09-14 DIAGNOSIS — J449 Chronic obstructive pulmonary disease, unspecified: Secondary | ICD-10-CM | POA: Diagnosis not present

## 2015-09-14 DIAGNOSIS — I509 Heart failure, unspecified: Secondary | ICD-10-CM | POA: Diagnosis not present

## 2015-09-16 ENCOUNTER — Other Ambulatory Visit: Payer: Self-pay

## 2015-09-16 DIAGNOSIS — R310 Gross hematuria: Secondary | ICD-10-CM

## 2015-09-18 ENCOUNTER — Ambulatory Visit: Payer: Medicare Other

## 2015-09-23 ENCOUNTER — Other Ambulatory Visit: Payer: Self-pay | Admitting: Specialist

## 2015-09-23 DIAGNOSIS — G4733 Obstructive sleep apnea (adult) (pediatric): Secondary | ICD-10-CM | POA: Diagnosis not present

## 2015-09-23 DIAGNOSIS — J449 Chronic obstructive pulmonary disease, unspecified: Secondary | ICD-10-CM | POA: Diagnosis not present

## 2015-09-23 DIAGNOSIS — J984 Other disorders of lung: Secondary | ICD-10-CM

## 2015-09-23 DIAGNOSIS — I509 Heart failure, unspecified: Secondary | ICD-10-CM | POA: Diagnosis not present

## 2015-09-23 DIAGNOSIS — R918 Other nonspecific abnormal finding of lung field: Secondary | ICD-10-CM

## 2015-09-26 ENCOUNTER — Ambulatory Visit (INDEPENDENT_AMBULATORY_CARE_PROVIDER_SITE_OTHER): Payer: Medicare Other

## 2015-09-26 DIAGNOSIS — E538 Deficiency of other specified B group vitamins: Secondary | ICD-10-CM

## 2015-09-26 MED ORDER — CYANOCOBALAMIN 1000 MCG/ML IJ SOLN
1000.0000 ug | Freq: Once | INTRAMUSCULAR | Status: AC
  Administered 2015-09-26: 1000 ug via INTRAMUSCULAR

## 2015-09-27 ENCOUNTER — Telehealth: Payer: Self-pay | Admitting: Family Medicine

## 2015-09-27 NOTE — Telephone Encounter (Signed)
Patient Name: Mckenzie Mclaughlin  DOB: [birth year]    Initial Comment Caller states she's constipated for 9 days.   Nurse Assessment  Nurse: [name], RN, [name] Date/Time [name] Time): 09/27/2015 9:57:26 AM  Confirm and document reason for call. If symptomatic, describe symptoms. You must click the next button to save text entered. ---Caller states that she has not had a BM in the last 9 days. Denies abdominal pain but it looks like it is swollen "some." She used a Duralax suppository yesterday. Denies vomiting.  Has the patient traveled out of the country within the last 30 days? ---No  Does the patient have any new or worsening symptoms? ---Yes  Will a triage be completed? ---Yes  Related visit to physician within the last 2 weeks? ---No  Does the PT have any chronic conditions? (i.e. diabetes, asthma, etc.) ---Yes  List chronic conditions. ---HTN, COPD, Stint in Aorta,  Is this a behavioral health or substance abuse call? ---No     Guidelines    Guideline Title Affirmed Question Affirmed Notes  Constipation Abdomen is more swollen than usual    Final Disposition User   See Physician within [place], RN, [name]    Comments  There are no appointments available at either [facility] or [facility]. Caller asked if I could see if Dr. Damita Dunnings could work her in today. I spoke with [name] who transferred me to [facility]. [name] states that she will need to go to an UC or ER if she needs to be seen today.  Caller notified of [name]'s recommendation. She states she will go to [facility] OFFICE   Disagree/Comply: Comply

## 2015-09-27 NOTE — Telephone Encounter (Signed)
Patient advised.

## 2015-09-27 NOTE — Telephone Encounter (Signed)
If not in pain, no blood passed, no fevers, no NAV, and if still passing gas, then okay to try miralax also.  Thanks.

## 2015-10-03 ENCOUNTER — Ambulatory Visit
Admission: RE | Admit: 2015-10-03 | Discharge: 2015-10-03 | Disposition: A | Discharge status: Home / Self Care | Payer: Medicare Other | Source: Ambulatory Visit | Attending: Urology | Admitting: Urology

## 2015-10-03 DIAGNOSIS — R310 Gross hematuria: Secondary | ICD-10-CM | POA: Diagnosis not present

## 2015-10-03 DIAGNOSIS — N200 Calculus of kidney: Secondary | ICD-10-CM | POA: Diagnosis not present

## 2015-10-03 LAB — POCT I-STAT CREATININE: Creatinine, Ser: 0.8 mg/dL (ref 0.44–1.00)

## 2015-10-03 MED ORDER — IOPAMIDOL (ISOVUE-300) INJECTION 61%
125.0000 mL | Freq: Once | INTRAVENOUS | Status: AC | PRN
  Administered 2015-10-03: 125 mL via INTRAVENOUS

## 2015-10-14 DIAGNOSIS — G4733 Obstructive sleep apnea (adult) (pediatric): Secondary | ICD-10-CM | POA: Diagnosis not present

## 2015-10-14 DIAGNOSIS — J449 Chronic obstructive pulmonary disease, unspecified: Secondary | ICD-10-CM | POA: Diagnosis not present

## 2015-10-14 DIAGNOSIS — I509 Heart failure, unspecified: Secondary | ICD-10-CM | POA: Diagnosis not present

## 2015-10-16 ENCOUNTER — Ambulatory Visit (INDEPENDENT_AMBULATORY_CARE_PROVIDER_SITE_OTHER): Payer: Medicare Other | Admitting: Urology

## 2015-10-16 ENCOUNTER — Encounter: Payer: Self-pay | Admitting: Urology

## 2015-10-16 VITALS — BP 164/79 | HR 89 | Ht 67.0 in | Wt 158.0 lb

## 2015-10-16 DIAGNOSIS — D4112 Neoplasm of uncertain behavior of left renal pelvis: Secondary | ICD-10-CM | POA: Diagnosis not present

## 2015-10-16 DIAGNOSIS — N200 Calculus of kidney: Secondary | ICD-10-CM

## 2015-10-16 DIAGNOSIS — R310 Gross hematuria: Secondary | ICD-10-CM

## 2015-10-16 NOTE — Progress Notes (Signed)
3:02 PM  10/20/15   Mckenzie Mclaughlin 1941-01-22 [id number]  Referring provider: Tonia Ghent, MD [address] [place], [place] [address]  Chief Complaint  Patient presents with  . Results    CTscan results    HPI:  75 yo with multiple GU issues who returns today for follow-up CT scan in the setting of left renal pelvic abnormality.  Gross hematuria Patient states she had her first episode of painless gross hematuria x single episode 01/2015.   Hematuria work up 12/16 including CT urogram (as below) and cystoscopy (negative)  Extensive smoking history, 1ppd x 45 years, quit 6-7 year ago.   Persistent microscopic hematuria today on UA  Kidney stones No flank pain since last visit Never previously passed stones or required surgical intervention. Asymptomatic 8.8 mm left renal pelvis stone along with other much smaller non obstructing lower pole stones on CT Urogram 02/2015. Repeat CT urogram 09/2011 shows interval increase in size the calculus to 13 mm along with 2 additional 4 mm nonobstructing stones Multiple medical comorbidities including 02 depent/ CAD  Left renal pelvic abnormality Incidental urothelial enhancement and mild fullness of the left renal upper tract collecting system, suspect chronic inflammation from stone.  Urine cytology negative 02/2015 No flank pain Repeat CT urogram on 10/03/2015 shows persistent enhancement of the urothelium on the left --> appears to be stable   PMH: Past Medical History:  Diagnosis Date  . Anginal pain [facility])    see dr [name]  "Spams"  . Anxiety   . Arthritis   . Cancer (HCC)    side of head- skin cancer, squamous cell ca on left foot.  . Complication of anesthesia   . Constipation   . COPD (chronic obstructive pulmonary disease) ([place])   . COPD, severe 07/29/98  . Coronary artery disease 01/22/99   Non Q-wave MI, stent RCA  . Depression   . Depression   . Dysrhythmia    Palpations at times. last time  10/29 ish 2014  . Esophageal dilatation    Pt needs appple sauce to take meds.  . Esophageal stricture    PT needs apple sauce to take meds  . GERD (gastroesophageal reflux disease) 08/29/98   severe-no meds now  . Head injury, acute, with loss of consciousness ([place]) 11/2012   unsure how long  . History of blood transfusion   . History of blurry vision 05/06-05/10/2009   Hospital [facility] CP R/O'D Blurry vision, smoking  . History of CT scan of head 08/02/09   w/o mild age appropr atrophy  . History of ETT 08/03/09   Myoview Normal  . History of ETT 05/1999   Cardiolite pos ETT, neg Cardiolite  . History of ETT 09/02/01   Normal  . History of ETT 04/28/2006   Myoview normal EF 83%  . History of ETT 04/10/11   normal EF and no ischemia per Dr. Saralyn Pilar  . Hx of cardiac catheterization 08/29/01   60% RCA 0/w 20-30% lesions  . Hx of cardiac catheterization 01/22/99   w/Stent 90% RCA lesion  . Hypertension    03/30/00  . Mental disorder   . On home oxygen therapy    as needed-has a travel tank  . Pneumothorax 2/14  . PONV (postoperative nausea and vomiting)    ad breast remocved and see was under anesthesia a long time  . Shortness of breath   . Status post aortic coarctation stent placement     Surgical  History: Past Surgical History:  Procedure Laterality Date  . ABDOMINAL HYSTERECTOMY    . BACK SURGERY    . BREAST SURGERY     implant removal, R breast  . CAROTID STENT Right 2000  . CATARACT EXTRACTION Bilateral    2016  . COLONOSCOPY    . ESOPHAGEAL DILATION  06/00   EGD  . LAMINECTOMY  1976   Disc Removal X 2 Lumbar  . MASTECTOMY  03/1976   Bilateral due FCBD with implants [facility])  . OOPHORECTOMY    . TISSUE EXPANDER PLACEMENT Right 02/03/2013   Procedure: REMOVAL OF RIGHT BREAST IMPLANT AND IMPLANT MATERIAL  CAPSULECTOMY;  Surgeon: Irene Limbo, MD;  Location: [facility];  Service: Plastics;  Laterality: Right;    Home Medications:    Medication List         Accurate as of 10/16/15 11:59 PM. Always use your most recent med list.          albuterol 108 (90 Base) MCG/ACT inhaler Commonly known as:  PROAIR HFA INHALE TWO PUFFS BY MOUTH EVERY 6 HOURS AS NEEDED   ALPRAZolam 0.5 MG tablet Commonly known as:  XANAX Take 1 tablet (0.5 mg total) by mouth 3 (three) times daily as needed.   aspirin EC 81 MG tablet Take 81 mg by mouth daily.   atorvastatin 20 MG tablet Commonly known as:  LIPITOR Take 1 tablet (20 mg total) by mouth daily.   budesonide-formoterol 160-4.5 MCG/ACT inhaler Commonly known as:  SYMBICORT Inhale 2 puffs into the lungs 2 (two) times daily.   clopidogrel 75 MG tablet Commonly known as:  PLAVIX Take 75 mg by mouth daily.   cyanocobalamin 1000 MCG/ML injection Commonly known as:  (VITAMIN B-12) Inject 1 mL (1,000 mcg total) into the muscle every 30 (thirty) days.   furosemide 20 MG tablet Commonly known as:  LASIX Take 20 mg by mouth daily.   metoprolol tartrate 25 MG tablet Commonly known as:  LOPRESSOR Take 25 mg by mouth.   nitroGLYCERIN 0.4 MG SL tablet Commonly known as:  NITROSTAT Place 1 tablet (0.4 mg total) under the tongue every 5 (five) minutes as needed for chest pain (max 3 doses in 15 min).   polyethylene glycol powder powder Commonly known as:  GLYCOLAX/MIRALAX Take by mouth.   SPIRIVA HANDIHALER 18 MCG inhalation capsule Generic drug:  tiotropium INHALE ONE DOSE ONCE DAILY   venlafaxine 37.5 MG tablet Commonly known as:  EFFEXOR TAKE 1 TABLET BY MOUTH TWICE A DAY       Allergies:  Allergies  Allergen Reactions  . Amoxicillin Shortness Of Breath  . Doxycycline Nausea Only and Other (See Comments)    Dizzy Reaction:  Dizziness   . Sertraline Hcl Nausea And Vomiting    REACTION: trembling    Family History: Family History  Problem Relation Age of Onset  . Stroke Mother   . Heart disease Mother     MI  . Cancer Father     Lung  . COPD Brother   . Cancer Brother      Lung with mets 2011  . Heart disease Brother     CAD  . Heart disease Sister     cirrhosis due heart disease  . Cirrhosis Sister   . Colon cancer Neg Hx   . Breast cancer Neg Hx     Social History:  reports that she quit smoking about 3 years ago. Her smoking use included Cigarettes. She has a 20.00 pack-year smoking history.  She has never used smokeless tobacco. She reports that she does not drink alcohol or use drugs.   Physical Exam: BP (!) 164/79   Pulse 89   Ht 5\' 7"  (1.702 m)   Wt 158 lb (71.7 kg)   BMI 24.75 kg/m    Constitutional: Well nourished. Alert and oriented, No acute distress.  Accompanied by her husband today. HEENT: [place] AT, moist mucus membranes. Trachea midline, no masses. Cardiovascular: No clubbing, cyanosis, or edema. Respiratory: Normal respiratory effort, mild increased WOB, wearning 02. GI: Abdomen is soft, non tender, non distended, no abdominal masses.  GU: No CVA tenderness.  Skin: No rashes, bruises or suspicious lesions. Neurologic: Grossly intact, no focal deficits, moving all 4 extremities. Psychiatric: Normal mood and affect.  Laboratory Data: Lab Results  Component Value Date   WBC 10.4 11/12/2014   HGB 13.7 11/12/2014   HCT 42.5 11/12/2014   MCV 87.6 11/12/2014   PLT 239 11/12/2014    Lab Results  Component Value Date   CREATININE 0.80 10/03/2015    Lab Results  Component Value Date   HGBA1C 5.2 04/21/2011    Urinalysis Results for orders placed or performed during the hospital encounter of 10/03/15  I-STAT creatinine  Result Value Ref Range   Creatinine, Ser 0.80 0.44 - 1.00 mg/dL    Pertinent Imaging: Study Result     CLINICAL DATA: Intermittent episodes of painless gross hematuria  EXAM: CT ABDOMEN AND PELVIS WITHOUT AND WITH CONTRAST  TECHNIQUE: Multidetector CT imaging of the abdomen and pelvis was performed following the standard protocol before and following the bolus administration of intravenous  contrast.  CONTRAST: 128mL ISOVUE-300 IOPAMIDOL (ISOVUE-300) INJECTION 61%  COMPARISON: CT 03/12/2015  FINDINGS: Lower chest: Lung bases are clear.  Hepatobiliary: No focal hepatic lesion. No biliary duct dilatation. Gallbladder is normal. Common bile duct is normal.  Pancreas: Pancreas is normal. No ductal dilatation. No pancreatic inflammation.  Spleen: Normal spleen  Adrenals/urinary tract: Adrenal glands are normal. 13 mm calculus in the pelvis of the LEFT kidney. There is mild enhancement of the renal pelvis on the LEFT. No obstructing lesion in the LEFT ureter . Two additional 4 mm calculi within the LEFT kidney.  No RIGHT ureterolithiasis or nephrolithiasis.  No enhancing renal cortical lesion. No filling defects the collecting systems or ureters on the renal pelvis calculus on the LEFT. No bladder calculi filling defect.  Stomach/Bowel: Stomach, small bowel, appendix, and cecum are normal. The colon and rectosigmoid colon are normal.  Vascular/Lymphatic: Abdominal aorta is normal caliber with atherosclerotic calcification. There is no retroperitoneal or periportal lymphadenopathy. No pelvic lymphadenopathy.  Reproductive: Post hysterectomy  Other: No free fluid.  Musculoskeletal: No aggressive osseous lesion.  IMPRESSION: 1. Large calculus within the LEFT renal pelvis. Mild enhancement of the urothelium of the LEFT renal pelvis could indicate infection or inflammation of the collecting system. 2. No evidence of obstructing calculus LEFT or RIGHT ureter. No hydronephrosis 3. Two nonobstructing LEFT renal calculi. 4. No bladder calculi.   Electronically Signed  By: Suzy Bouchard M.D.  On: 10/03/2015 17:37   CT scan reviewed personally today and with the patient.  Assessment & Plan:   1. Gross hematuria/ persistent microscopic hematuria S/p cystoscopy 02/2015 and CT Urogram x 2, findings a below - Urinalysis, Complete -  ciprofloxacin (CIPRO) tablet 500 mg; Take 1 tablet (500 mg total) by mouth once. - lidocaine (XYLOCAINE) 2 % jelly 1 application; Place 1 application into the urethra once.  2. Kidney stone on left  side Asymptomatic now 13 mm stone, increased from 8.8. mm 6 months ago Continues to be asymptomatic She does not desire treatment at this time  3. 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   Repeat CT urogram is essentially unchanged, persistent urothelial enhancement without obvious filling defects other than from the stone We had a lengthy discussion today that although inflammation is the most likely etiology for this radiological finding, malignancy cannot be ruled out especially given her history of smoking Unfortunately, the patient is a relatively poor surgical candidate given her history of coronary artery disease and oxygen dependency/COPD. As such, options moving forward were reviewed in detail. These include continued observation to assess for possible progression versus proceeding to the operating room for ureteroscopy/possible biopsy/treatment of the stone concomitantly.  Although the procedures relatively low risk, she is a moderate to high risk operative candidate. After reviewing the risks and benefits, she is elected to defer diagnostic intervention. She understands that there is a small risk of having undiagnosed disease and progression as a result and accepts this risk.  We'll plan to repeat imaging in 6 months to a year.  Return in about 6 months (around 04/17/2016) for f/u CT Urogram, cytology.  Hollice Espy, MD  [facility] [address], [place], [place] [address] [phone]

## 2015-10-23 DIAGNOSIS — G4733 Obstructive sleep apnea (adult) (pediatric): Secondary | ICD-10-CM | POA: Diagnosis not present

## 2015-10-23 DIAGNOSIS — I509 Heart failure, unspecified: Secondary | ICD-10-CM | POA: Diagnosis not present

## 2015-10-23 DIAGNOSIS — J449 Chronic obstructive pulmonary disease, unspecified: Secondary | ICD-10-CM | POA: Diagnosis not present

## 2015-10-29 ENCOUNTER — Ambulatory Visit (INDEPENDENT_AMBULATORY_CARE_PROVIDER_SITE_OTHER): Payer: Medicare Other | Admitting: *Deleted

## 2015-10-29 DIAGNOSIS — E538 Deficiency of other specified B group vitamins: Secondary | ICD-10-CM | POA: Diagnosis not present

## 2015-10-29 MED ORDER — CYANOCOBALAMIN 1000 MCG/ML IJ SOLN
1000.0000 ug | Freq: Once | INTRAMUSCULAR | Status: AC
  Administered 2015-10-29: 1000 ug via INTRAMUSCULAR

## 2015-10-30 DIAGNOSIS — Z1283 Encounter for screening for malignant neoplasm of skin: Secondary | ICD-10-CM | POA: Diagnosis not present

## 2015-10-30 DIAGNOSIS — D692 Other nonthrombocytopenic purpura: Secondary | ICD-10-CM | POA: Diagnosis not present

## 2015-10-30 DIAGNOSIS — L570 Actinic keratosis: Secondary | ICD-10-CM | POA: Diagnosis not present

## 2015-10-30 DIAGNOSIS — Z85828 Personal history of other malignant neoplasm of skin: Secondary | ICD-10-CM | POA: Diagnosis not present

## 2015-10-30 DIAGNOSIS — L578 Other skin changes due to chronic exposure to nonionizing radiation: Secondary | ICD-10-CM | POA: Diagnosis not present

## 2015-11-11 ENCOUNTER — Other Ambulatory Visit: Payer: Self-pay | Admitting: *Deleted

## 2015-11-11 MED ORDER — ALPRAZOLAM 0.5 MG PO TABS: 0.5000 mg | ORAL_TABLET | Freq: Three times a day (TID) | ORAL | 2 refills | Status: DC | PRN

## 2015-11-11 NOTE — Telephone Encounter (Signed)
Please call in.  Thanks.   

## 2015-11-11 NOTE — Telephone Encounter (Signed)
Faxed refill request. Last Filled:    90 tablet 2 08/12/2015  Last office visit:   06/06/15  Please advise.

## 2015-11-12 NOTE — Telephone Encounter (Signed)
Medication phoned to pharmacy.  

## 2015-11-14 DIAGNOSIS — J449 Chronic obstructive pulmonary disease, unspecified: Secondary | ICD-10-CM | POA: Diagnosis not present

## 2015-11-14 DIAGNOSIS — G4733 Obstructive sleep apnea (adult) (pediatric): Secondary | ICD-10-CM | POA: Diagnosis not present

## 2015-11-14 DIAGNOSIS — I509 Heart failure, unspecified: Secondary | ICD-10-CM | POA: Diagnosis not present

## 2015-11-22 ENCOUNTER — Encounter: Payer: Self-pay | Admitting: Family Medicine

## 2015-11-22 ENCOUNTER — Ambulatory Visit (INDEPENDENT_AMBULATORY_CARE_PROVIDER_SITE_OTHER): Payer: Medicare Other | Admitting: Family Medicine

## 2015-11-22 DIAGNOSIS — J441 Chronic obstructive pulmonary disease with (acute) exacerbation: Secondary | ICD-10-CM

## 2015-11-22 MED ORDER — ONDANSETRON HCL 4 MG PO TABS: 4.0000 mg | ORAL_TABLET | Freq: Three times a day (TID) | ORAL | 0 refills | Status: DC | PRN

## 2015-11-22 MED ORDER — LEVOFLOXACIN 500 MG PO TABS: 500.0000 mg | ORAL_TABLET | Freq: Every day | ORAL | 0 refills | Status: DC

## 2015-11-22 MED ORDER — PREDNISONE 20 MG PO TABS: ORAL_TABLET | ORAL | 0 refills | Status: DC

## 2015-11-22 NOTE — Progress Notes (Signed)
Cough.  Sputum, discolored, more than normal.  Still on O2 at baseline.   Cough started/got worse in the last few days, worse yesterday. Temp this AM 100.  Fatigue.   Nausea.  No vomiting.  She may have swallowed some of the mucous.   Her breathing is a little worse than baseline, but still speaking in complete sentences w/o inc WOB.   Recently with chills.   Some bruising on the arms at baseline, no rash.  Some ST, no ear pain.   Meds, vitals, and allergies reviewed.   ROS: Per HPI unless specifically indicated in ROS section   GEN: nad, alert and oriented, on O2 at baseline HEENT: mucous membranes moist, OP wnl, no erythema NECK: supple w/o LA CV: rrr.  PULM: diffuse dec in BS with occ scattered rhonchi, no inc wob o/w, speaking in complete sentences.  ABD: soft, +bs EXT: no edema SKIN: no acute rash but some bruising noted on the arms, extensor side.

## 2015-11-22 NOTE — Patient Instructions (Signed)
Start levaquin today, take prednisone with food, use your inhalers and take zofran if needed for nausea.  Update [place] as needed.  Take care.  Glad to see you.

## 2015-11-22 NOTE — Progress Notes (Signed)
Pre visit review using our clinic review tool, if applicable. No additional management support is needed unless otherwise documented below in the visit note. 

## 2015-11-23 DIAGNOSIS — J449 Chronic obstructive pulmonary disease, unspecified: Secondary | ICD-10-CM | POA: Diagnosis not present

## 2015-11-23 DIAGNOSIS — I509 Heart failure, unspecified: Secondary | ICD-10-CM | POA: Diagnosis not present

## 2015-11-23 DIAGNOSIS — G4733 Obstructive sleep apnea (adult) (pediatric): Secondary | ICD-10-CM | POA: Diagnosis not present

## 2015-11-24 NOTE — Assessment & Plan Note (Signed)
Start levaquin today, take prednisone with food with routine cautions, use routine inhalers and take zofran if needed for nausea.  At this point, okay for outpatient follow-up. Discussed with patient about routine ER cautions. It is likely some of the sputum production is causing nausea. This should get better with treatment. She agrees.

## 2015-11-28 ENCOUNTER — Other Ambulatory Visit: Payer: Self-pay | Admitting: Family Medicine

## 2015-11-29 ENCOUNTER — Telehealth: Payer: Self-pay | Admitting: *Deleted

## 2015-11-29 ENCOUNTER — Ambulatory Visit (INDEPENDENT_AMBULATORY_CARE_PROVIDER_SITE_OTHER): Payer: Medicare Other | Admitting: Family Medicine

## 2015-11-29 DIAGNOSIS — E538 Deficiency of other specified B group vitamins: Secondary | ICD-10-CM | POA: Diagnosis not present

## 2015-11-29 MED ORDER — PREDNISONE 20 MG PO TABS: ORAL_TABLET | ORAL | 0 refills | Status: DC

## 2015-11-29 MED ORDER — CYANOCOBALAMIN 1000 MCG/ML IJ SOLN
1000.0000 ug | Freq: Once | INTRAMUSCULAR | Status: AC
  Administered 2015-11-29: 1000 ug via INTRAMUSCULAR

## 2015-11-29 MED ORDER — LEVOFLOXACIN 500 MG PO TABS: 500.0000 mg | ORAL_TABLET | Freq: Every day | ORAL | 0 refills | Status: DC

## 2015-11-29 NOTE — Telephone Encounter (Signed)
Talked with patient at nurse visit.  Some dec in SOB, cough.  No fevers.  Finishing abx and pred.  No ADE on med.  Sputum is lighter colored now but still not clear.  Still on O2.  D/w pt.  She is likely some better but with still incomplete response. On exam, speaking in complete sentences with no wheeze but global dec in BS.  At this point, would extend abx and pred course, she agrees.  Both rxs sent.  She'll update me as needed.  Continue baseline meds o/w.  She agrees.

## 2015-11-29 NOTE — Progress Notes (Signed)
Nurse visit

## 2015-11-29 NOTE — Telephone Encounter (Signed)
Please refill abx due to not feeling better since last visit. Thanks!

## 2015-12-03 IMAGING — CT CT CHEST W/O CM
1 of 2 series · 14 of 32 positions shown, 18 images · non-contrast
Comparison: Multiple exams, including 03/25/2014 and 02/21/2014

CLINICAL DATA: Shortness of breath. COPD. Esophageal stricture.
Pulmonary nodule follow up.

EXAM:
CT CHEST WITHOUT CONTRAST
TECHNIQUE: Multidetector CT imaging of the chest was performed following the
standard protocol without IV contrast.

[Series 2: routine chest wo · axial · 0.65mm/px · z∈[-344,-54]mm · 14 of 70 slices shown, 18 images]
[im 6/70  mediastinal]
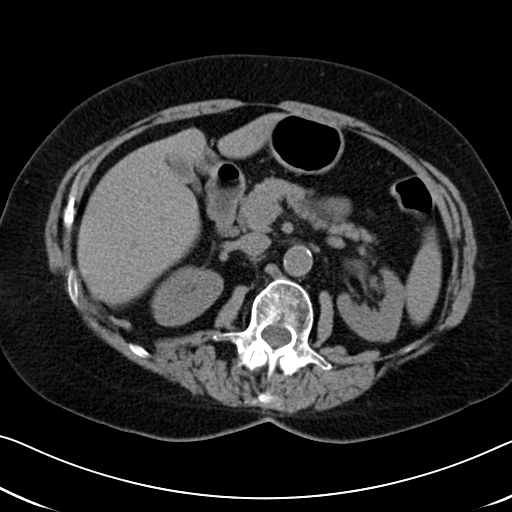
[im 6/70  lung]
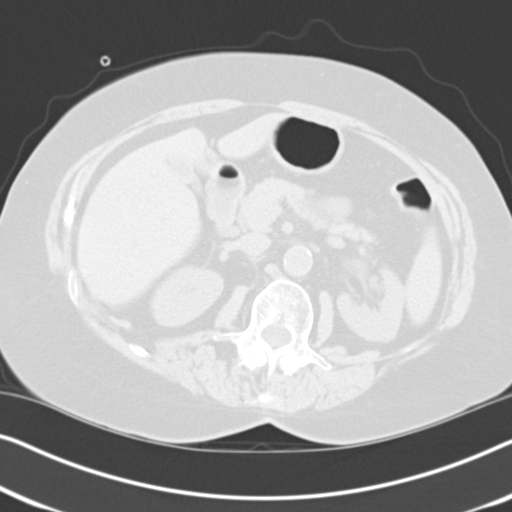
[im 11/70  lung]
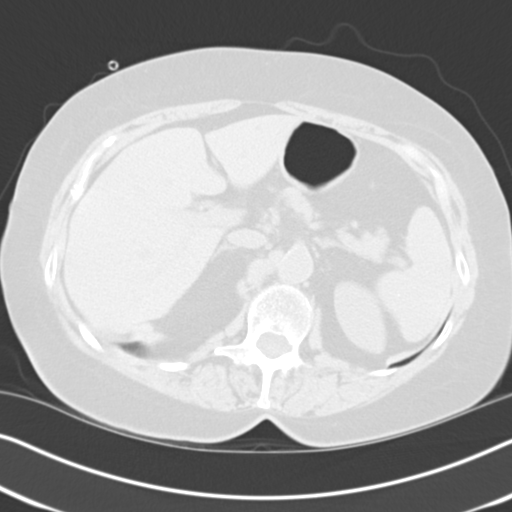
[im 16/70  lung]
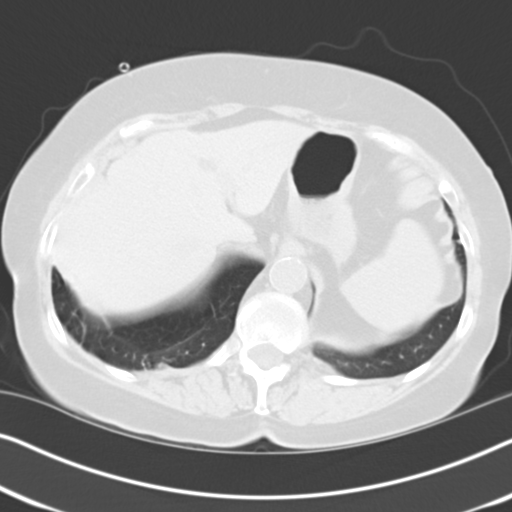
[im 22/70  lung]
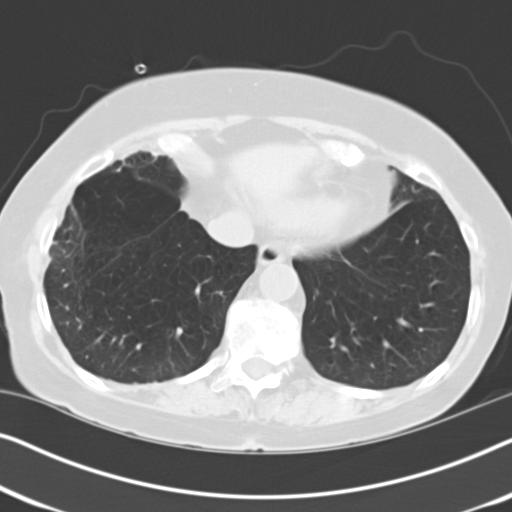
[im 27/70  mediastinal]
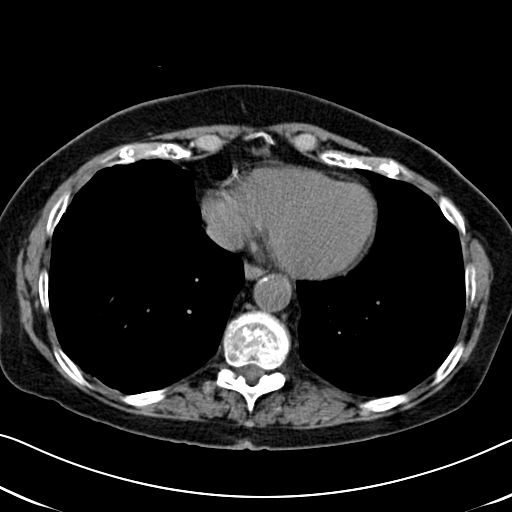
[im 27/70  lung]
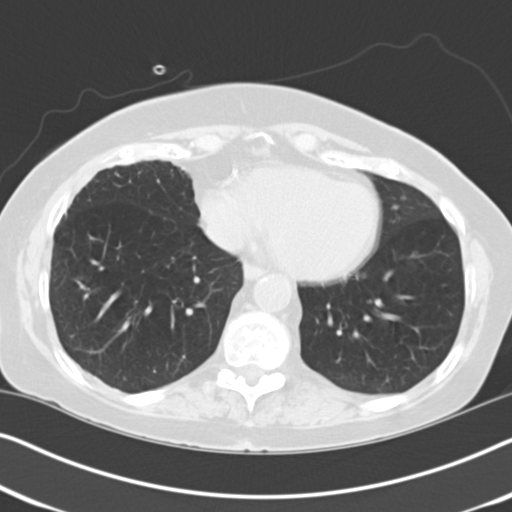
[im 32/70  lung]
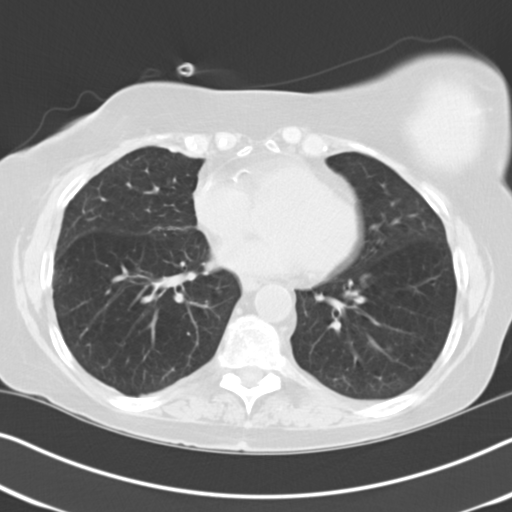
[im 34/70  lung]
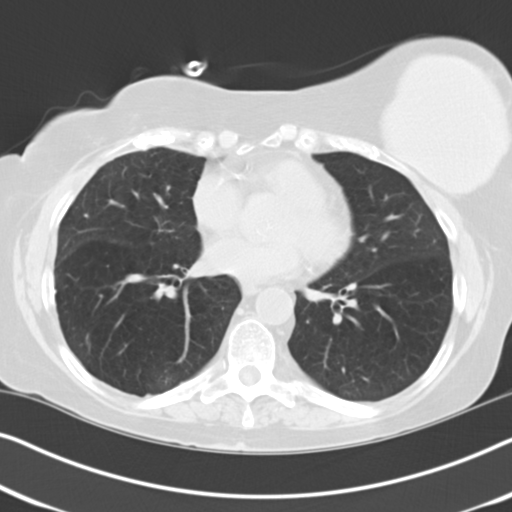
[im 35/70  lung]
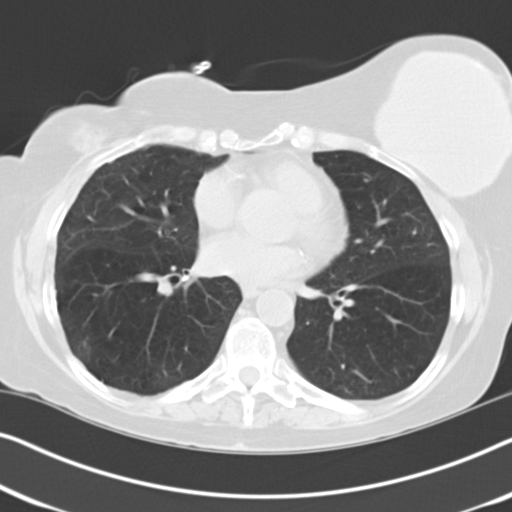
[im 38/70  mediastinal]
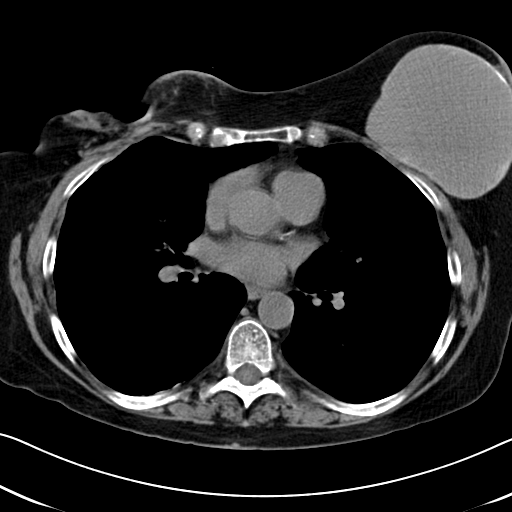
[im 38/70  lung]
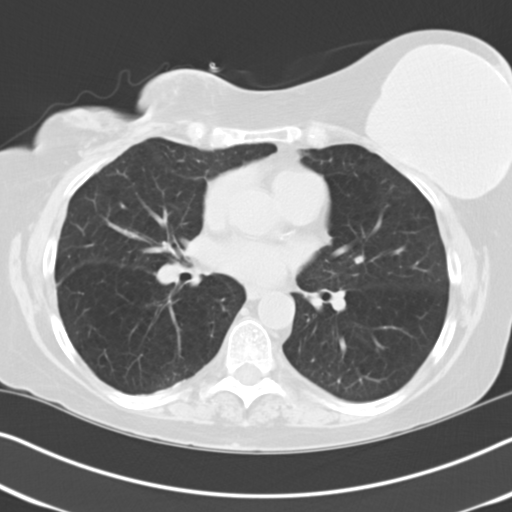
[im 43/70  lung]
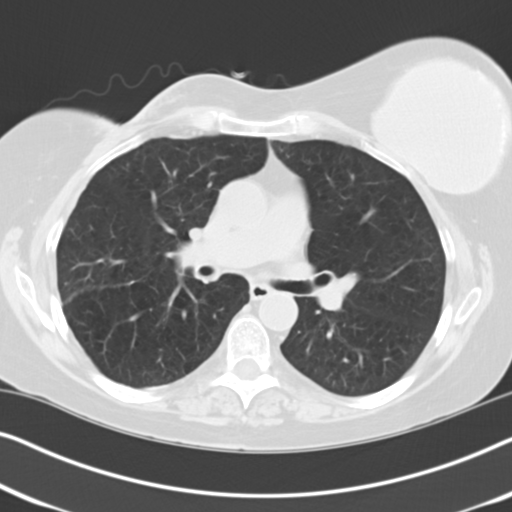
[im 48/70  lung]
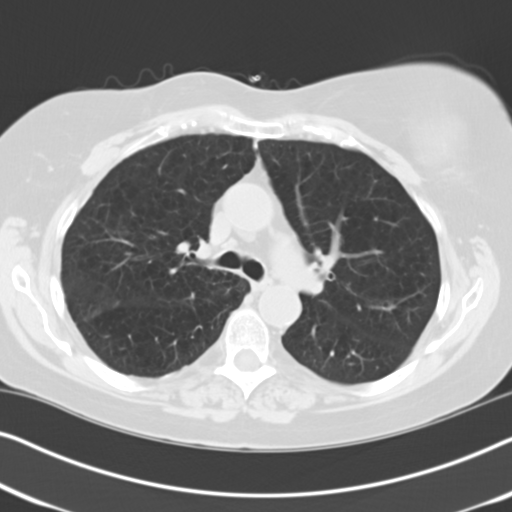
[im 54/70  lung]
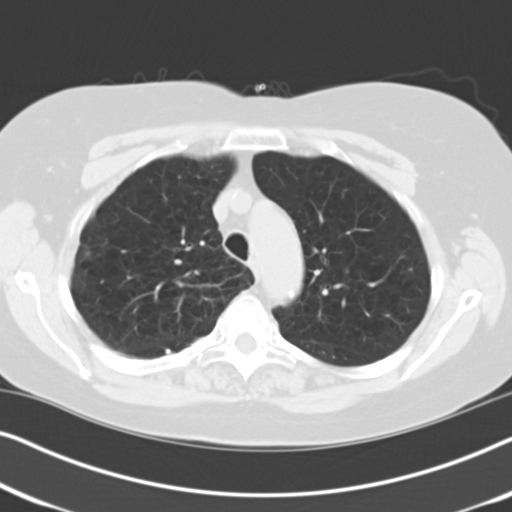
[im 59/70  mediastinal]
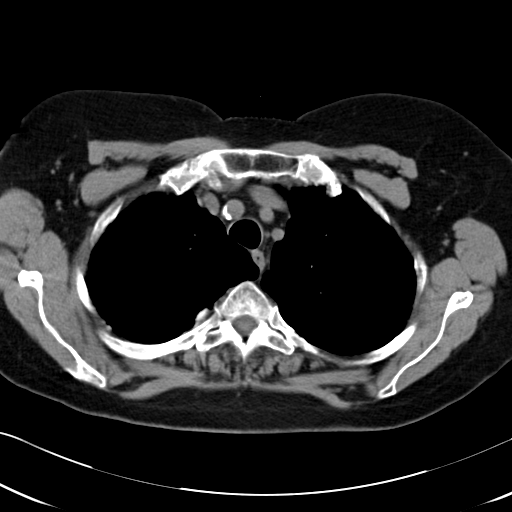
[im 59/70  lung]
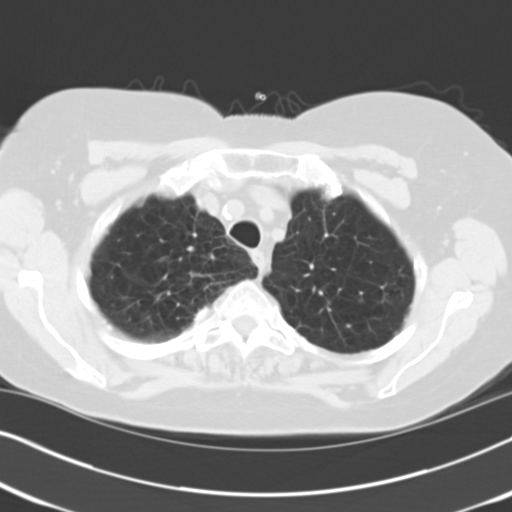
[im 64/70  lung]
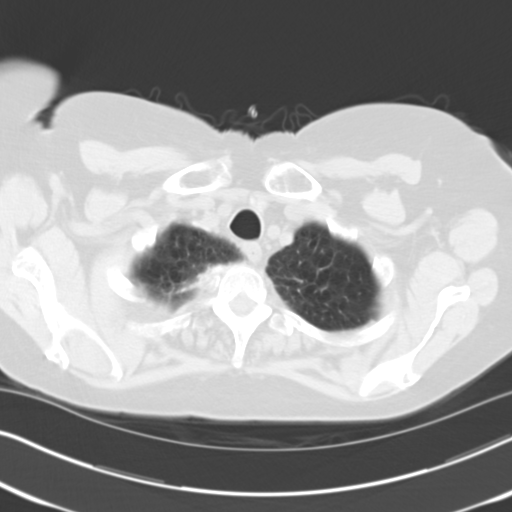

[14 of 32 positions shown; findings below may reference images not displayed]

FINDINGS: Mediastinum/Nodes: Aortic arch, branch vessel, and coronary
atherosclerotic calcification. Right mastectomy. Left breast implant
with capsular calcification.

No pathologic thoracic adenopathy identified.

Lungs/Pleura: Severe emphysema. 0.9 by 0.5 cm apical segment right
upper lobe pulmonary nodule, image 10 series 3, no change from
02/21/2014, previously there was a larger cavitary lesion in this
vicinity on 07/09/2012 and 06/02/2012 but the central cavity is no
longer apparent. 03/09/2013, the lesion including the cavitary
component measured 1.5 by 1.2 cm.

New bandlike density in the right upper lobe measures 1.4 by 0.4 cm
on image nineteen series 3.

Calcified right hilar node. Calcified granulomas in the superior
segment right lower lobe, anteriorly in the right upper lobe, in the
right middle lobe, and in the right lower lobe.

3 mm thick pleural-based nodule along the right lower lobe, image 34
series 3, stable from 4300 and considered benign. Mild pleural
thickening posteriorly along the right upper lobe appears stable.

Upper abdomen: Left hepatic lobe cyst, image 55 series 2. Possible
dependent densities in the gallbladder on image 66 series 2,
gallstones not excluded. Left mid kidney nephrolithiasis.

Musculoskeletal: No significant abnormality.
IMPRESSION: 1. Old granulomatous disease in the right chest. The patient
previously had cavitary lesions in the cavitary components have
resolved. There is a residual slightly irregular right apical 0.9 by
0.5 cm lesion which could simply represent residual scarring after
resolution of the cavitary lesion, but which nonetheless warrants
continued follow up. There is also a new bandlike opacity in the
right upper lobe which has a slight nodular component, again likely
due to atelectasis or interval inflammation, but meriting followup.
I would suggest a final follow up in 12 months to ensure stability.
2. Severe emphysema.
3. Left nephrolithiasis.
4. Possible gallstones.
5. Atherosclerosis.
6. Postoperative findings in the breasts.

## 2015-12-05 ENCOUNTER — Other Ambulatory Visit (INDEPENDENT_AMBULATORY_CARE_PROVIDER_SITE_OTHER): Payer: Medicare Other

## 2015-12-05 ENCOUNTER — Ambulatory Visit (INDEPENDENT_AMBULATORY_CARE_PROVIDER_SITE_OTHER): Payer: Medicare Other

## 2015-12-05 ENCOUNTER — Other Ambulatory Visit: Payer: Self-pay | Admitting: Family Medicine

## 2015-12-05 VITALS — BP 112/72 | HR 68 | Temp 98.4°F | Ht 66.5 in | Wt 157.0 lb

## 2015-12-05 DIAGNOSIS — Z0000 Encounter for general adult medical examination without abnormal findings: Secondary | ICD-10-CM

## 2015-12-05 DIAGNOSIS — I2510 Atherosclerotic heart disease of native coronary artery without angina pectoris: Secondary | ICD-10-CM

## 2015-12-05 DIAGNOSIS — E538 Deficiency of other specified B group vitamins: Secondary | ICD-10-CM

## 2015-12-05 DIAGNOSIS — Z23 Encounter for immunization: Secondary | ICD-10-CM | POA: Diagnosis not present

## 2015-12-05 LAB — CBC WITH DIFFERENTIAL/PLATELET
Basophils Absolute: 0.1 10*3/uL (ref 0.0–0.1)
Basophils Relative: 0.5 % (ref 0.0–3.0)
EOS ABS: 0.1 10*3/uL (ref 0.0–0.7)
EOS PCT: 1.1 % (ref 0.0–5.0)
HEMATOCRIT: 42.5 % (ref 36.0–46.0)
HEMOGLOBIN: 14.1 g/dL (ref 12.0–15.0)
LYMPHS PCT: 15.1 % (ref 12.0–46.0)
Lymphs Abs: 1.9 10*3/uL (ref 0.7–4.0)
MCHC: 33.2 g/dL (ref 30.0–36.0)
MCV: 89.5 fl (ref 78.0–100.0)
MONO ABS: 0.6 10*3/uL (ref 0.1–1.0)
Monocytes Relative: 5.2 % (ref 3.0–12.0)
Neutro Abs: 9.7 10*3/uL — ABNORMAL HIGH (ref 1.4–7.7)
Neutrophils Relative %: 78.1 % — ABNORMAL HIGH (ref 43.0–77.0)
Platelets: 367 10*3/uL (ref 150.0–400.0)
RBC: 4.75 Mil/uL (ref 3.87–5.11)
RDW: 13.3 % (ref 11.5–15.5)
WBC: 12.4 10*3/uL — AB (ref 4.0–10.5)

## 2015-12-05 LAB — COMPREHENSIVE METABOLIC PANEL
ALBUMIN: 3.8 g/dL (ref 3.5–5.2)
ALK PHOS: 87 U/L (ref 39–117)
ALT: 16 U/L (ref 0–35)
AST: 23 U/L (ref 0–37)
BUN: 17 mg/dL (ref 6–23)
CO2: 36 mEq/L — ABNORMAL HIGH (ref 19–32)
CREATININE: 0.84 mg/dL (ref 0.40–1.20)
Calcium: 9.5 mg/dL (ref 8.4–10.5)
Chloride: 101 mEq/L (ref 96–112)
GFR: 70.69 mL/min (ref >60.00)
GLUCOSE: 92 mg/dL (ref 70–99)
POTASSIUM: 4.4 meq/L (ref 3.5–5.1)
SODIUM: 141 meq/L (ref 135–145)
TOTAL PROTEIN: 7.2 g/dL (ref 6.0–8.3)
Total Bilirubin: 0.4 mg/dL (ref 0.2–1.2)

## 2015-12-05 LAB — VITAMIN B12: VITAMIN B 12: 841 pg/mL (ref 211–911)

## 2015-12-05 LAB — LIPID PANEL
CHOLESTEROL: 103 mg/dL (ref 0–200)
HDL: 35.9 mg/dL — ABNORMAL LOW (ref >39.00)
LDL CALC: 31 mg/dL (ref 0–99)
NONHDL: 66.71
Total CHOL/HDL Ratio: 3
Triglycerides: 177 mg/dL — ABNORMAL HIGH (ref 0.0–149.0)
VLDL: 35.4 mg/dL (ref 0.0–40.0)

## 2015-12-05 NOTE — Progress Notes (Signed)
Subjective:   Mckenzie Mclaughlin is a 75 y.o. female who presents for Medicare Annual (Subsequent) preventive examination.  Review of Systems:  N/A Cardiac Risk Factors include: advanced age (>41men, >86 women);dyslipidemia;hypertension     Objective:     Vitals: BP 112/72 (BP Location: Left Arm, Patient Position: Sitting, Cuff Size: Normal)   Pulse 68   Temp 98.4 F (36.9 C) (Oral)   Ht 5' 6.5" (1.689 m) Comment: no shoes  Wt 157 lb (71.2 kg)   SpO2 98%   BMI 24.96 kg/m   Body mass index is 24.96 kg/m.   Tobacco History  Smoking Status  . Former Smoker  . Packs/day: 0.40  . Years: 50.00  . Types: Cigarettes  . Quit date: 02/28/2012  Smokeless Tobacco  . Never Used    Comment: 1/4 PPD as of 2012     Counseling given: No   Past Medical History:  Diagnosis Date  . Anginal pain [facility])    see dr [name]  "Spams"  . Anxiety   . Arthritis   . Cancer (HCC)    side of head- skin cancer, squamous cell ca on left foot.  . Complication of anesthesia   . Constipation   . COPD (chronic obstructive pulmonary disease) ([place])   . COPD, severe 07/29/98  . Coronary artery disease 01/22/99   Non Q-wave MI, stent RCA  . Depression   . Depression   . Dysrhythmia    Palpations at times. last time 10/29 ish 2014  . Esophageal dilatation    Pt needs appple sauce to take meds.  . Esophageal stricture    PT needs apple sauce to take meds  . GERD (gastroesophageal reflux disease) 08/29/98   severe-no meds now  . Head injury, acute, with loss of consciousness ([place]) 11/2012   unsure how long  . History of blood transfusion   . History of blurry vision 05/06-05/10/2009   Hospital [facility] CP R/O'D Blurry vision, smoking  . History of CT scan of head 08/02/09   w/o mild age appropr atrophy  . History of ETT 08/03/09   Myoview Normal  . History of ETT 05/1999   Cardiolite pos ETT, neg Cardiolite  . History of ETT 09/02/01   Normal  . History of ETT 04/28/2006   Myoview normal  EF 83%  . History of ETT 04/10/11   normal EF and no ischemia per Dr. Saralyn Pilar  . Hx of cardiac catheterization 08/29/01   60% RCA 0/w 20-30% lesions  . Hx of cardiac catheterization 01/22/99   w/Stent 90% RCA lesion  . Hypertension    03/30/00  . Mental disorder   . On home oxygen therapy    as needed-has a travel tank  . Pneumothorax 2/14  . PONV (postoperative nausea and vomiting)    ad breast remocved and see was under anesthesia a long time  . Shortness of breath   . Status post aortic coarctation stent placement    Past Surgical History:  Procedure Laterality Date  . ABDOMINAL HYSTERECTOMY    . BACK SURGERY    . BREAST SURGERY     implant removal, R breast  . CAROTID STENT Right 2000  . CATARACT EXTRACTION Bilateral    2016  . COLONOSCOPY    . ESOPHAGEAL DILATION  06/00   EGD  . LAMINECTOMY  1976   Disc Removal X 2 Lumbar  . MASTECTOMY  03/1976   Bilateral due FCBD with implants [facility])  . OOPHORECTOMY    .  TISSUE EXPANDER PLACEMENT Right 02/03/2013   Procedure: REMOVAL OF RIGHT BREAST IMPLANT AND IMPLANT MATERIAL  CAPSULECTOMY;  Surgeon: Irene Limbo, MD;  Location: [place];  Service: Plastics;  Laterality: Right;   Family History  Problem Relation Age of Onset  . Stroke Mother   . Heart disease Mother     MI  . Cancer Father     Lung  . COPD Brother   . Cancer Brother     Lung with mets 2011  . Heart disease Brother     CAD  . Heart disease Sister     cirrhosis due heart disease  . Cirrhosis Sister   . Colon cancer Neg Hx   . Breast cancer Neg Hx    History  Sexual Activity  . Sexual activity: No    Outpatient Encounter Prescriptions as of 12/05/2015  Medication Sig  . albuterol (PROAIR HFA) 108 (90 Base) MCG/ACT inhaler INHALE TWO PUFFS BY MOUTH EVERY 6 HOURS AS NEEDED  . ALPRAZolam (XANAX) 0.5 MG tablet Take 1 tablet (0.5 mg total) by mouth 3 (three) times daily as needed.  Marland Kitchen aspirin EC 81 MG tablet Take 81 mg by mouth daily.  Marland Kitchen  atorvastatin (LIPITOR) 20 MG tablet Take 1 tablet (20 mg total) by mouth daily.  . budesonide-formoterol (SYMBICORT) 160-4.5 MCG/ACT inhaler Inhale 2 puffs into the lungs 2 (two) times daily.  . clopidogrel (PLAVIX) 75 MG tablet Take 75 mg by mouth daily.  . cyanocobalamin (,VITAMIN B-12,) 1000 MCG/ML injection Inject 1 mL (1,000 mcg total) into the muscle every 30 (thirty) days.  . furosemide (LASIX) 20 MG tablet Take 20 mg by mouth daily.  . metoprolol tartrate (LOPRESSOR) 25 MG tablet Take 25 mg by mouth.  . nitroGLYCERIN (NITROSTAT) 0.4 MG SL tablet Place 1 tablet (0.4 mg total) under the tongue every 5 (five) minutes as needed for chest pain (max 3 doses in 15 min).  . ondansetron (ZOFRAN) 4 MG tablet Take 1 tablet (4 mg total) by mouth every 8 (eight) hours as needed for nausea or vomiting.  . polyethylene glycol powder (GLYCOLAX/MIRALAX) powder Take by mouth.  . tiotropium (SPIRIVA HANDIHALER) 18 MCG inhalation capsule INHALE ONE DOSE ONCE DAILY  . venlafaxine (EFFEXOR) 37.5 MG tablet TAKE 1 TABLET BY MOUTH TWICE (2) DAILY  . [DISCONTINUED] levofloxacin (LEVAQUIN) 500 MG tablet Take 1 tablet (500 mg total) by mouth daily.  . [DISCONTINUED] predniSONE (DELTASONE) 20 MG tablet 3 day for 2 days, then 2 day for 2 days, then 1 day for 2 days, then 1/2 a day for 2 days. With food.   No facility-administered encounter medications on file as of 12/05/2015.     Activities of Daily Living In your present state of health, do you have any difficulty performing the following activities: 12/05/2015  Hearing? N  Vision? N  Difficulty concentrating or making decisions? Y  Walking or climbing stairs? Y  Dressing or bathing? N  Doing errands, shopping? Y  Preparing Food and eating ? N  Using the Toilet? N  In the past six months, have you accidently leaked urine? N  Do you have problems with loss of bowel control? N  Managing your Medications? N  Managing your Finances? N  Housekeeping or managing  your Housekeeping? N  Some recent data might be hidden    Patient Care Team: Tonia Ghent, MD as PCP - General (Family Medicine) Grace Isaac, MD as Consulting Physician (Cardiothoracic Surgery) Nestor Lewandowsky, MD as Referring Physician (Thoracic  Diseases) Erby Pian, MD as Referring Physician Erby Pian, MD as Referring Physician    Assessment:     Hearing Screening   125Hz  250Hz  500Hz  1000Hz  2000Hz  3000Hz  4000Hz  6000Hz  8000Hz   Right ear:   0 0 40  0    Left ear:   0 0 40  0    Vision Screening Comments: Last vision exam with Dr. Gloriann Loan in Jan 2017   Exercise Activities and Dietary recommendations Current Exercise Habits: The patient does not participate in regular exercise at present, Exercise limited by: respiratory conditions(s);cardiac condition(s)  Goals    . Increase water intake          Starting 12/05/2015, I will attempt to increase intake of water to 6-8 glasses of daily.       Fall Risk Fall Risk  12/05/2015 01/22/2015 08/10/2014 07/07/2013  Falls in the past year? No No No Yes  Number falls in past yr: - - - 1  Injury with Fall? - - - Yes   Depression Screen PHQ 2/9 Scores 12/05/2015 01/22/2015 08/10/2014 07/07/2013  PHQ - 2 Score 3 2 4 4   PHQ- 9 Score 13 6 - -     Cognitive Testing MMSE - Mini Mental State Exam 12/05/2015  Orientation to time 5  Orientation to Place 5  Registration 3  Attention/ Calculation 0  Recall 3  Language- name 2 objects 0  Language- repeat 1  Language- follow 3 step command 3  Language- read & follow direction 0  Write a sentence 0  Copy design 0  Total score 20   PLEASE NOTE: A Mini-Cog screen was completed. Maximum score is 20. A value of 0 denotes this part of [place] MMSE was not completed or the patient failed this part of the Mini-Cog screening.   Mini-Cog Screening Orientation to Time - Max 5 pts Orientation to Place - Max 5 pts Registration - Max 3 pts Recall - Max 3 pts Language Repeat - Max 1  pts Language Follow 3 Step Command - Max 3 pts  Immunization History  Administered Date(s) Administered  . Influenza Split 01/23/2011, 12/16/2011  . Influenza Whole 12/28/2001, 01/03/2008, 12/11/2009  . Influenza,inj,Quad PF,36+ Mos 12/21/2012, 12/20/2013, 12/18/2014  . Pneumococcal Conjugate-13 08/10/2014  . Pneumococcal Polysaccharide-23 12/05/2015  . Td 04/30/2005   Screening Tests Health Maintenance  Topic Date Due  . INFLUENZA VACCINE  03/29/2016 (Originally 10/29/2015)  . TETANUS/TDAP  12/04/2016 (Originally 05/01/2015)  . MAMMOGRAM  12/04/2025 (Originally 12/31/2014)  . COLONOSCOPY  05/22/2017  . DEXA SCAN  Completed  . PNA vac Low Risk Adult  Completed      Plan:   I have personally reviewed and addressed the Medicare Annual Wellness questionnaire and have noted the following in the patient's chart:  A. Medical and social history B. Use of alcohol, tobacco or illicit drugs  C. Current medications and supplements D. Functional ability and status E.  Nutritional status F.  Physical activity G. Advance directives H. List of other physicians I.  Hospitalizations, surgeries, and ER visits in previous 12 months J.  [place] to include hearing, vision, cognitive, depression L. Referrals and appointments - none  In addition, I have reviewed and discussed with patient certain preventive protocols, quality metrics, and best practice recommendations. A written personalized care plan for preventive services as well as general preventive health recommendations were provided to patient.  See attached scanned questionnaire for additional information.   Signed,   Lindell Noe, MHA, BS, LPN  Health Advisor

## 2015-12-05 NOTE — Progress Notes (Signed)
Pre visit review using our clinic review tool, if applicable. No additional management support is needed unless otherwise documented below in the visit note. 

## 2015-12-05 NOTE — Patient Instructions (Signed)
Mckenzie Mclaughlin , Thank you for taking time to come for your Medicare Wellness Visit. I appreciate your ongoing commitment to your health goals. Please review the following plan we discussed and let me know if I can assist you in the future.   These are the goals we discussed: Goals    . Increase water intake          Starting 12/05/2015, I will attempt to increase intake of water to 6-8 glasses of daily.        This is a list of the screening recommended for you and due dates:  Health Maintenance  Topic Date Due  . Flu Shot  03/29/2016*  . Tetanus Vaccine  12/04/2016*  . Mammogram  12/04/2025*  . Colon Cancer Screening  05/22/2017  . DEXA scan (bone density measurement)  Completed  . Pneumonia vaccines  Completed  *Topic was postponed. The date shown is not the original due date.   Preventive Care for Adults  A healthy lifestyle and preventive care can promote health and wellness. Preventive health guidelines for adults include the following key practices.  . A routine yearly physical is a good way to check with your health care provider about your health and preventive screening. It is a chance to share any concerns and updates on your health and to receive a thorough exam.  . Visit your dentist for a routine exam and preventive care every 6 months. Brush your teeth twice a day and floss once a day. Good oral hygiene prevents tooth decay and gum disease.  . The frequency of eye exams is based on your age, health, family medical history, use  of contact lenses, and other factors. Follow your health care provider's ecommendations for frequency of eye exams.  . Eat a healthy diet. Foods like vegetables, fruits, whole grains, low-fat dairy products, and lean protein foods contain the nutrients you need without too many calories. Decrease your intake of foods high in solid fats, added sugars, and salt. Eat the right amount of calories for you. Get information about a proper diet from your  health care provider, if necessary.  . Regular physical exercise is one of the most important things you can do for your health. Most adults should get at least 150 minutes of moderate-intensity exercise (any activity that increases your heart rate and causes you to sweat) each week. In addition, most adults need muscle-strengthening exercises on 2 or more days a week.  Silver Sneakers may be a benefit available to you. To determine eligibility, you may visit the website: www.silversneakers.com or contact program at 670-294-6888 Mon-Fri between 8AM-8PM.   . Maintain a healthy weight. The body mass index (BMI) is a screening tool to identify possible weight problems. It provides an estimate of body fat based on height and weight. Your health care provider can find your BMI and can help you achieve or maintain a healthy weight.   For adults 20 years and older: ? A BMI below 18.5 is considered underweight. ? A BMI of 18.5 to 24.9 is normal. ? A BMI of 25 to 29.9 is considered overweight. ? A BMI of 30 and above is considered obese.   . Maintain normal blood lipids and cholesterol levels by exercising and minimizing your intake of saturated fat. Eat a balanced diet with plenty of fruit and vegetables. Blood tests for lipids and cholesterol should begin at age 37 and be repeated every 5 years. If your lipid or cholesterol levels are high, you  are over 42, or you are at high risk for heart disease, you may need your cholesterol levels checked more frequently. Ongoing high lipid and cholesterol levels should be treated with medicines if diet and exercise are not working.  . If you smoke, find out from your health care provider how to quit. If you do not use tobacco, please do not start.  . If you choose to drink alcohol, please do not consume more than 2 drinks per day. One drink is considered to be 12 ounces (355 mL) of beer, 5 ounces (148 mL) of wine, or 1.5 ounces (44 mL) of liquor.  . If you are  67-50 years old, ask your health care provider if you should take aspirin to prevent strokes.  . Use sunscreen. Apply sunscreen liberally and repeatedly throughout the day. You should seek shade when your shadow is shorter than you. Protect yourself by wearing long sleeves, pants, a wide-brimmed hat, and sunglasses year round, whenever you are outdoors.  . Once a month, do a whole body skin exam, using a mirror to look at the skin on your back. Tell your health care provider of new moles, moles that have irregular borders, moles that are larger than a pencil eraser, or moles that have changed in shape or color.

## 2015-12-05 NOTE — Progress Notes (Signed)
PCP notes:   Health maintenance:  PPSV23 - administered Flu vaccine - addressed Tetanus - insurance/postponed  Abnormal screenings:   Hearing - failed Depression score: 13  Patient concerns:   None  Nurse concerns:  None  Next PCP appt:   12/19/15 @ 1500  I reviewed health advisor's note, was available for consultation on the day of service listed in this note, and agree with documentation and plan. Elsie Stain, MD.

## 2015-12-09 ENCOUNTER — Other Ambulatory Visit: Payer: Self-pay | Admitting: Family Medicine

## 2015-12-09 NOTE — Telephone Encounter (Signed)
Received refill request electronically Please advise if okay to refill medications? Last office visit 12/05/15 Medicare wellness

## 2015-12-10 NOTE — Telephone Encounter (Signed)
Sent. Thanks.   

## 2015-12-15 DIAGNOSIS — J449 Chronic obstructive pulmonary disease, unspecified: Secondary | ICD-10-CM | POA: Diagnosis not present

## 2015-12-15 DIAGNOSIS — I509 Heart failure, unspecified: Secondary | ICD-10-CM | POA: Diagnosis not present

## 2015-12-15 DIAGNOSIS — G4733 Obstructive sleep apnea (adult) (pediatric): Secondary | ICD-10-CM | POA: Diagnosis not present

## 2015-12-19 ENCOUNTER — Encounter: Payer: Self-pay | Admitting: Family Medicine

## 2015-12-19 ENCOUNTER — Ambulatory Visit (INDEPENDENT_AMBULATORY_CARE_PROVIDER_SITE_OTHER): Payer: Medicare Other | Admitting: Family Medicine

## 2015-12-19 VITALS — BP 102/70 | HR 96 | Temp 98.2°F | Wt 155.8 lb

## 2015-12-19 DIAGNOSIS — J449 Chronic obstructive pulmonary disease, unspecified: Secondary | ICD-10-CM

## 2015-12-19 DIAGNOSIS — E538 Deficiency of other specified B group vitamins: Secondary | ICD-10-CM | POA: Diagnosis not present

## 2015-12-19 DIAGNOSIS — Z7189 Other specified counseling: Secondary | ICD-10-CM

## 2015-12-19 DIAGNOSIS — F418 Other specified anxiety disorders: Secondary | ICD-10-CM

## 2015-12-19 DIAGNOSIS — Z23 Encounter for immunization: Secondary | ICD-10-CM

## 2015-12-19 DIAGNOSIS — F419 Anxiety disorder, unspecified: Secondary | ICD-10-CM

## 2015-12-19 DIAGNOSIS — E785 Hyperlipidemia, unspecified: Secondary | ICD-10-CM

## 2015-12-19 DIAGNOSIS — F329 Major depressive disorder, single episode, unspecified: Secondary | ICD-10-CM

## 2015-12-19 MED ORDER — VENLAFAXINE HCL 37.5 MG PO TABS
ORAL_TABLET | ORAL | 5 refills | Status: DC
Start: 2015-12-19 — End: 2016-09-14

## 2015-12-19 NOTE — Assessment & Plan Note (Signed)
B12 normalize. Continue as is. Discussed with patient. She agrees.

## 2015-12-19 NOTE — Progress Notes (Signed)
Pre visit review using our clinic review tool, if applicable. No additional management support is needed unless otherwise documented below in the visit note. 

## 2015-12-19 NOTE — Patient Instructions (Signed)
Try the higher dose of venlafaxine in the meantime, 2 in the AM and 1 in the PM.  See if that helps.  Update me as needed.  Take care.  Glad to see you.

## 2015-12-19 NOTE — Assessment & Plan Note (Addendum)
She has multiple stressors, including her baseline pulmonary disease. Reasonable to increase venlafaxine dose in the meantime. She'll take 2 tabs in the morning and 1 in the evening. She can update me as needed. Continue baseline benzodiazepine as needed. >25 minutes spent in face to face time with patient, >50% spent in counselling or coordination of care

## 2015-12-19 NOTE — Assessment & Plan Note (Signed)
Continue baseline inhalers. Continue oxygen. She is improved from the last time I saw her here in the clinic, it appears that her recent exacerbation is improved.

## 2015-12-19 NOTE — Progress Notes (Signed)
Depression. Unclear if recent screening was affected by how she felt (from pulmonary disease) that day.  She has a lot of stressors in her life at baseline and that is likely contributing.  Still on venlafaxine.  Still safe at home.  No SI/HI.  D/w pt about inc in venlafaxine.  See AVS.   Advance directive- husband designated if patient were incapacitated.  Noted that if patient were acutely ill with the chance of recovery, then she would be okay with short term intensive treatment.  She would want to avoid long term intervention.  For example, if PNA and would need vent support of potential short duration with chance of recovery, then okay to proceed with intubation.  Reaffirmed with patient 12/19/15.    Elevated Cholesterol: Using medications without problems:yes Muscle aches: no.  Exercise:limited.  LDL at goal.   B12 wnl now.  Recent labs d/w pt.    PMH and SH reviewed  Meds, vitals, and allergies reviewed.   ROS: Per HPI unless specifically indicated in ROS section   GEN: nad, alert and oriented HEENT: mucous membranes moist NECK: supple w/o LA CV: rrr. PULM: coarse BS but inc air movement compared to prev, no inc wob, still on O2 ABD: soft, +bs EXT: no edema SKIN: no acute rash

## 2015-12-19 NOTE — Assessment & Plan Note (Signed)
Reasonable control. Continue as is. She agrees. Labs discussed with patient.

## 2015-12-19 NOTE — Assessment & Plan Note (Signed)
Advance directive- husband designated if patient were incapacitated.  Noted that if patient were acutely ill with the chance of recovery, then she would be okay with short term intensive treatment.  She would want to avoid long term intervention.  For example, if PNA and would need vent support of potential short duration with chance of recovery, then okay to proceed with intubation.

## 2015-12-24 ENCOUNTER — Ambulatory Visit
Admission: RE | Admit: 2015-12-24 | Discharge: 2015-12-24 | Disposition: A | Discharge status: Home / Self Care | Payer: Medicare Other | Source: Ambulatory Visit | Attending: Specialist | Admitting: Specialist

## 2015-12-24 DIAGNOSIS — J439 Emphysema, unspecified: Secondary | ICD-10-CM | POA: Diagnosis not present

## 2015-12-24 DIAGNOSIS — R918 Other nonspecific abnormal finding of lung field: Secondary | ICD-10-CM | POA: Insufficient documentation

## 2015-12-24 DIAGNOSIS — K8020 Calculus of gallbladder without cholecystitis without obstruction: Secondary | ICD-10-CM | POA: Diagnosis not present

## 2015-12-24 DIAGNOSIS — N200 Calculus of kidney: Secondary | ICD-10-CM | POA: Diagnosis not present

## 2015-12-24 DIAGNOSIS — J449 Chronic obstructive pulmonary disease, unspecified: Secondary | ICD-10-CM | POA: Diagnosis not present

## 2015-12-24 DIAGNOSIS — R911 Solitary pulmonary nodule: Secondary | ICD-10-CM | POA: Insufficient documentation

## 2015-12-24 DIAGNOSIS — G4733 Obstructive sleep apnea (adult) (pediatric): Secondary | ICD-10-CM | POA: Diagnosis not present

## 2015-12-24 DIAGNOSIS — I509 Heart failure, unspecified: Secondary | ICD-10-CM | POA: Diagnosis not present

## 2015-12-24 DIAGNOSIS — J984 Other disorders of lung: Secondary | ICD-10-CM

## 2015-12-25 ENCOUNTER — Other Ambulatory Visit: Payer: Self-pay | Admitting: Family Medicine

## 2015-12-25 DIAGNOSIS — E538 Deficiency of other specified B group vitamins: Secondary | ICD-10-CM

## 2015-12-31 ENCOUNTER — Ambulatory Visit (INDEPENDENT_AMBULATORY_CARE_PROVIDER_SITE_OTHER): Payer: Medicare Other | Admitting: *Deleted

## 2015-12-31 ENCOUNTER — Other Ambulatory Visit (INDEPENDENT_AMBULATORY_CARE_PROVIDER_SITE_OTHER): Payer: Medicare Other

## 2015-12-31 DIAGNOSIS — E538 Deficiency of other specified B group vitamins: Secondary | ICD-10-CM

## 2015-12-31 LAB — VITAMIN B12: VITAMIN B 12: 465 pg/mL (ref 211–911)

## 2015-12-31 MED ORDER — CYANOCOBALAMIN 1000 MCG/ML IJ SOLN
1000.0000 ug | INTRAMUSCULAR | Status: DC
  Administered 2015-12-31 – 2016-09-10 (×2): 1000 ug via INTRAMUSCULAR

## 2016-01-08 ENCOUNTER — Other Ambulatory Visit: Payer: Self-pay | Admitting: Specialist

## 2016-01-08 DIAGNOSIS — R911 Solitary pulmonary nodule: Secondary | ICD-10-CM

## 2016-01-14 DIAGNOSIS — J449 Chronic obstructive pulmonary disease, unspecified: Secondary | ICD-10-CM | POA: Diagnosis not present

## 2016-01-14 DIAGNOSIS — I509 Heart failure, unspecified: Secondary | ICD-10-CM | POA: Diagnosis not present

## 2016-01-14 DIAGNOSIS — G4733 Obstructive sleep apnea (adult) (pediatric): Secondary | ICD-10-CM | POA: Diagnosis not present

## 2016-01-15 DIAGNOSIS — R0902 Hypoxemia: Secondary | ICD-10-CM | POA: Diagnosis not present

## 2016-01-15 DIAGNOSIS — R0609 Other forms of dyspnea: Secondary | ICD-10-CM | POA: Diagnosis not present

## 2016-01-15 DIAGNOSIS — R918 Other nonspecific abnormal finding of lung field: Secondary | ICD-10-CM | POA: Diagnosis not present

## 2016-01-15 DIAGNOSIS — J441 Chronic obstructive pulmonary disease with (acute) exacerbation: Secondary | ICD-10-CM | POA: Diagnosis not present

## 2016-01-15 DIAGNOSIS — R05 Cough: Secondary | ICD-10-CM | POA: Diagnosis not present

## 2016-01-23 DIAGNOSIS — J449 Chronic obstructive pulmonary disease, unspecified: Secondary | ICD-10-CM | POA: Diagnosis not present

## 2016-01-23 DIAGNOSIS — G4733 Obstructive sleep apnea (adult) (pediatric): Secondary | ICD-10-CM | POA: Diagnosis not present

## 2016-01-23 DIAGNOSIS — I509 Heart failure, unspecified: Secondary | ICD-10-CM | POA: Diagnosis not present

## 2016-02-03 ENCOUNTER — Encounter: Payer: Self-pay | Admitting: *Deleted

## 2016-02-03 ENCOUNTER — Emergency Department
Admission: EM | Admit: 2016-02-03 | Discharge: 2016-02-03 | Disposition: A | Discharge status: Home / Self Care | Payer: Medicare Other | Attending: Emergency Medicine | Admitting: Emergency Medicine

## 2016-02-03 ENCOUNTER — Emergency Department: Payer: Medicare Other

## 2016-02-03 DIAGNOSIS — J441 Chronic obstructive pulmonary disease with (acute) exacerbation: Secondary | ICD-10-CM | POA: Insufficient documentation

## 2016-02-03 DIAGNOSIS — I2510 Atherosclerotic heart disease of native coronary artery without angina pectoris: Secondary | ICD-10-CM | POA: Insufficient documentation

## 2016-02-03 DIAGNOSIS — R05 Cough: Secondary | ICD-10-CM | POA: Diagnosis not present

## 2016-02-03 DIAGNOSIS — I10 Essential (primary) hypertension: Secondary | ICD-10-CM | POA: Diagnosis not present

## 2016-02-03 DIAGNOSIS — Z87891 Personal history of nicotine dependence: Secondary | ICD-10-CM | POA: Diagnosis not present

## 2016-02-03 DIAGNOSIS — Z79899 Other long term (current) drug therapy: Secondary | ICD-10-CM | POA: Insufficient documentation

## 2016-02-03 DIAGNOSIS — Z859 Personal history of malignant neoplasm, unspecified: Secondary | ICD-10-CM | POA: Diagnosis not present

## 2016-02-03 DIAGNOSIS — R0602 Shortness of breath: Secondary | ICD-10-CM | POA: Diagnosis not present

## 2016-02-03 DIAGNOSIS — Z7982 Long term (current) use of aspirin: Secondary | ICD-10-CM | POA: Insufficient documentation

## 2016-02-03 DIAGNOSIS — Z955 Presence of coronary angioplasty implant and graft: Secondary | ICD-10-CM | POA: Insufficient documentation

## 2016-02-03 LAB — CBC
HCT: 42.5 % (ref 35.0–47.0)
HEMOGLOBIN: 14.4 g/dL (ref 12.0–16.0)
MCH: 29.7 pg (ref 26.0–34.0)
MCHC: 34 g/dL (ref 32.0–36.0)
MCV: 87.2 fL (ref 80.0–100.0)
Platelets: 259 10*3/uL (ref 150–440)
RBC: 4.87 MIL/uL (ref 3.80–5.20)
RDW: 13.8 % (ref 11.5–14.5)
WBC: 11.7 10*3/uL — AB (ref 3.6–11.0)

## 2016-02-03 LAB — BASIC METABOLIC PANEL
ANION GAP: 7 (ref 5–15)
BUN: 14 mg/dL (ref 6–20)
CALCIUM: 9.5 mg/dL (ref 8.9–10.3)
CO2: 31 mmol/L (ref 22–32)
Chloride: 98 mmol/L — ABNORMAL LOW (ref 101–111)
Creatinine, Ser: 0.85 mg/dL (ref 0.44–1.00)
GFR calc Af Amer: >60 mL/min (ref >60)
GLUCOSE: 81 mg/dL (ref 65–99)
Potassium: 3.6 mmol/L (ref 3.5–5.1)
SODIUM: 136 mmol/L (ref 135–145)

## 2016-02-03 LAB — TROPONIN I

## 2016-02-03 MED ORDER — PREDNISONE 10 MG PO TABS: ORAL_TABLET | ORAL | 0 refills | Status: DC

## 2016-02-03 MED ORDER — AZITHROMYCIN 250 MG PO TABS: ORAL_TABLET | ORAL | 0 refills | Status: DC

## 2016-02-03 MED ORDER — PREDNISONE 20 MG PO TABS
40.0000 mg | ORAL_TABLET | Freq: Once | ORAL | Status: AC
  Administered 2016-02-03: 40 mg via ORAL
  Filled 2016-02-03: qty 2

## 2016-02-03 MED ORDER — AZITHROMYCIN 500 MG PO TABS
500.0000 mg | ORAL_TABLET | Freq: Once | ORAL | Status: AC
  Administered 2016-02-03: 500 mg via ORAL
  Filled 2016-02-03: qty 1

## 2016-02-03 NOTE — ED Triage Notes (Signed)
Pt is O2 dependant at 2L, pt complains of increased shortness of breath, cough with yellow sputum, fever of 100 last night, pt has dyspnea at rest

## 2016-02-03 NOTE — ED Notes (Signed)
Patient transported to X-ray 

## 2016-02-03 NOTE — Discharge Instructions (Signed)
Your evaluated for coughing and trouble breathing with low grade fever, and her pain treated for COPD exacerbation with prednisone, and antibiotic azithromycin. Please also take your albuterol (pro-air) inhaler 2 puffs every 4 hours as needed for wheezing or shortness of breath.  Return to the emergency room for any worsening condition including fever greater than 101, any worsening trouble breathing, chest pain, vomiting and cannot keep medication down, dizziness, lightheadedness, passing out, or any other symptoms concerning to you.

## 2016-02-03 NOTE — ED Provider Notes (Signed)
[facility] Emergency Department Provider Note ____________________________________________   I have reviewed the triage vital signs and the triage nursing note.  HISTORY  Chief Complaint Shortness of Breath   Historian Patient  HPI Mckenzie Mclaughlin is a 75 y.o. female with a history of COPD, for which she takes controller medications as well as pro-air, reports that she's had increased cough for about 3 days now. Last night she states she had a low-grade fever of 100. She tried to call her primary care physician's office, Dr. Damita Dunnings, and they had no openings today. She used her poor this morning. No chest pain. The main issue has been a moderate cough which is mildly productive of whitish yellowish sputum. Moderate shortness of breath, she does wear home 2 L nasal cannula oxygen.    Past Medical History:  Diagnosis Date  . Anginal pain [facility])    see dr [name]  "Spams"  . Anxiety   . Arthritis   . Cancer (HCC)    side of head- skin cancer, squamous cell ca on left foot.  . Complication of anesthesia   . Constipation   . COPD (chronic obstructive pulmonary disease) ([place])   . COPD, severe 07/29/98  . Coronary artery disease 01/22/99   Non Q-wave MI, stent RCA  . Depression   . Depression   . Dysrhythmia    Palpations at times. last time 10/29 ish 2014  . Esophageal dilatation    Pt needs appple sauce to take meds.  . Esophageal stricture    PT needs apple sauce to take meds  . GERD (gastroesophageal reflux disease) 08/29/98   severe-no meds now  . Head injury, acute, with loss of consciousness ([place]) 11/2012   unsure how long  . History of blood transfusion   . History of blurry vision 05/06-05/10/2009   Hospital [facility] CP R/O'D Blurry vision, smoking  . History of CT scan of head 08/02/09   w/o mild age appropr atrophy  . History of ETT 08/03/09   Myoview Normal  . History of ETT 05/1999   Cardiolite pos ETT, neg Cardiolite  . History of ETT  09/02/01   Normal  . History of ETT 04/28/2006   Myoview normal EF 83%  . History of ETT 04/10/11   normal EF and no ischemia per Dr. Saralyn Pilar  . Hx of cardiac catheterization 08/29/01   60% RCA 0/w 20-30% lesions  . Hx of cardiac catheterization 01/22/99   w/Stent 90% RCA lesion  . Hypertension    03/30/00  . Mental disorder   . On home oxygen therapy    as needed-has a travel tank  . Pneumothorax 2/14  . PONV (postoperative nausea and vomiting)    ad breast remocved and see was under anesthesia a long time  . Shortness of breath   . Status post aortic coarctation stent placement     Patient Active Problem List   Diagnosis Date Noted  . Atrophic vaginitis 02/28/2015  . Gross hematuria 02/26/2015  . Radicular pain in right arm 11/21/2014  . Pupil asymmetry 11/16/2014  . Osteoporosis 11/16/2014  . Senile purpura ([place]) 11/16/2014  . Right hand pain 11/16/2014  . Advance care planning 08/15/2014  . Other specified counseling 08/15/2014  . Constipation 06/21/2014  . NSTEMI (non-ST elevated myocardial infarction) ([place]) 12/17/2013  . Acute subendocardial infarction ([place]) 12/17/2013  . History of cardiac catheterization 09/15/2013  . Chest pain on exertion 08/25/2013  . Medicare annual wellness visit, subsequent 07/09/2013  .  Beat, premature ventricular 06/26/2013  . Awareness of heartbeats 06/26/2013  . Arteriosclerosis of coronary artery 01/05/2013  . Pulmonary emphysema ([place]) 01/05/2013  . Acid reflux 01/05/2013  . H/O pneumothorax 01/05/2013  . Neck pain 08/31/2012  . Anxiety and depression 07/11/2012  . Dysthymia 07/11/2012  . Esophageal dilatation   . Esophageal stricture   . Nutritional marasmus ([place]) 06/22/2012  . COPD with acute exacerbation ([place]) 06/17/2012  . Chronic obstructive pulmonary disease with acute exacerbation ([place]) 06/17/2012  . Lung nodule 04/17/2012  . Edema of foot 04/01/2012  . Paresthesia 10/27/2011  . Atypical chest pain 05/13/2011  . HLD  (hyperlipidemia) 02/26/2011  . History of mammogram 02/26/2011  . Vertigo 02/18/2011  . B12 deficiency 01/31/2009  . HEADACHE 10/22/2006  . PREMATURE VENTRICULAR CONTRACTIONS, FREQUENT 07/19/2006  . HYPERTENSION 03/30/2000  . Essential (primary) hypertension 03/30/2000  . CORONARY ARTERY DISEASE 01/22/1999  . Atherosclerosis of coronary artery 01/22/1999  . GERD 08/29/1998  . Chronic obstructive pulmonary disease ([place]) 07/29/1998    Past Surgical History:  Procedure Laterality Date  . ABDOMINAL HYSTERECTOMY    . BACK SURGERY    . BREAST SURGERY     implant removal, R breast  . CAROTID STENT Right 2000  . CATARACT EXTRACTION Bilateral    2016  . COLONOSCOPY    . ESOPHAGEAL DILATION  06/00   EGD  . LAMINECTOMY  1976   Disc Removal X 2 Lumbar  . MASTECTOMY  03/1976   Bilateral due FCBD with implants [facility])  . OOPHORECTOMY    . TISSUE EXPANDER PLACEMENT Right 02/03/2013   Procedure: REMOVAL OF RIGHT BREAST IMPLANT AND IMPLANT MATERIAL  CAPSULECTOMY;  Surgeon: Irene Limbo, MD;  Location: [place];  Service: Plastics;  Laterality: Right;    Prior to Admission medications   Medication Sig Start Date End Date Taking? Authorizing Provider  albuterol (PROAIR HFA) 108 (90 Base) MCG/ACT inhaler INHALE TWO PUFFS BY MOUTH EVERY 6 HOURS AS NEEDED 08/13/15  Yes Tonia Ghent, MD  ALPRAZolam Duanne Moron) 0.5 MG tablet Take 1 tablet (0.5 mg total) by mouth 3 (three) times daily as needed. 11/11/15  Yes Tonia Ghent, MD  aspirin EC 81 MG tablet Take 81 mg by mouth daily.   Yes Historical Provider, MD  atorvastatin (LIPITOR) 20 MG tablet TAKE 1 TABLET BY MOUTH ONCE A DAY 12/10/15  Yes Tonia Ghent, MD  budesonide-formoterol [facility]) 160-4.5 MCG/ACT inhaler Inhale 2 puffs into the lungs 2 (two) times daily. 06/18/14  Yes Tonia Ghent, MD  clopidogrel (PLAVIX) 75 MG tablet TAKE 1 TABLET BY MOUTH ONCE A DAY 12/10/15  Yes Tonia Ghent, MD  furosemide (LASIX) 20 MG tablet Take 20 mg by  mouth daily.   Yes Historical Provider, MD  metoprolol tartrate (LOPRESSOR) 25 MG tablet Take 25 mg by mouth.   Yes Historical Provider, MD  tiotropium (SPIRIVA HANDIHALER) 18 MCG inhalation capsule INHALE ONE DOSE ONCE DAILY 07/04/14  Yes Historical Provider, MD  venlafaxine (EFFEXOR) 37.5 MG tablet 2 tabs in the AM, 1 tab in the PM. 12/19/15  Yes Tonia Ghent, MD  nitroGLYCERIN (NITROSTAT) 0.4 MG SL tablet Place 1 tablet (0.4 mg total) under the tongue every 5 (five) minutes as needed for chest pain (max 3 doses in 15 min). 12/29/13   Tonia Ghent, MD  polyethylene glycol powder [facility]) powder Take by mouth. 06/20/14   Historical Provider, MD    Allergies  Allergen Reactions  . Amoxicillin Shortness Of Breath  .  Doxycycline Nausea Only and Other (See Comments)    Dizzy Reaction:  Dizziness   . Sertraline Hcl Nausea And Vomiting    REACTION: trembling    Family History  Problem Relation Age of Onset  . Stroke Mother   . Heart disease Mother     MI  . Cancer Father     Lung  . COPD Brother   . Cancer Brother     Lung with mets 2011  . Heart disease Brother     CAD  . Heart disease Sister     cirrhosis due heart disease  . Cirrhosis Sister   . Colon cancer Neg Hx   . Breast cancer Neg Hx     Social History Social History  Substance Use Topics  . Smoking status: Former Smoker    Packs/day: 0.40    Years: 50.00    Types: Cigarettes    Quit date: 02/28/2012  . Smokeless tobacco: Never Used     Comment: 1/4 PPD as of 2012  . Alcohol use No    Review of Systems  Constitutional:Positive for low-grade oral fever.. Eyes: Negative for visual changes. ENT: Negative for sore throat. Cardiovascular: Negative for chest pain. Respiratory: Positive for shortness of breath especially with cough. Gastrointestinal: Negative for abdominal pain, vomiting and diarrhea. Genitourinary: Negative for dysuria. Musculoskeletal: Negative for back pain. Skin: Negative for  rash. Neurological: Negative for headache. 10 point Review of Systems otherwise negative ____________________________________________   PHYSICAL EXAM:  VITAL SIGNS: ED Triage Vitals  Enc Vitals Group     BP 02/03/16 1108 (!) 144/59     Pulse --      Resp 02/03/16 1108 (!) 24     Temp 02/03/16 1108 97.8 F (36.6 C)     Temp Source 02/03/16 1108 Oral     SpO2 02/03/16 1108 98 %     Weight 02/03/16 1109 153 lb (69.4 kg)     Height 02/03/16 1109 5\' 7"  (1.702 m)     Head Circumference --      Peak Flow --      Pain Score --      Pain Loc --      Pain Edu? --      Excl. in [place]? --      Constitutional: Alert and oriented. Well appearing and in no distress. HEENT   Head: Normocephalic and atraumatic.      Eyes: Conjunctivae are normal. PERRL. Normal extraocular movements.      Ears:         Nose: No congestion/rhinnorhea.Wearing nasal cannula 2 L, home oxygen.   Mouth/Throat: Mucous membranes are moist.   Neck: No stridor. Cardiovascular/Chest: Normal rate, regular rhythm.  No murmurs, rubs, or gallops. Respiratory: Normal respiratory effort without tachypnea nor retractions. Breath sounds are clear and equal bilaterally. No wheezes/rales/rhonchi. Gastrointestinal: Soft. No distention, no guarding, no rebound. Nontender.    Genitourinary/rectal:Deferred Musculoskeletal: Nontender with normal range of motion in all extremities. No joint effusions.  No lower extremity tenderness.  No edema. Neurologic:  Normal speech and language. No gross or focal neurologic deficits are appreciated. Skin:  Skin is warm, dry and intact. No rash noted. Psychiatric: Mood and affect are normal. Speech and behavior are normal. Patient exhibits appropriate insight and judgment.   ____________________________________________  LABS (pertinent positives/negatives)  Labs Reviewed  BASIC METABOLIC PANEL - Abnormal; Notable for the following:       Result Value   Chloride 98 (*)    All  other components within normal limits  CBC - Abnormal; Notable for the following:    WBC 11.7 (*)    All other components within normal limits  TROPONIN I    ____________________________________________    EKG I, Lisa Roca, MD, the attending physician have personally viewed and interpreted all ECGs.  96 bpm. Normal sinus rhythm. Left axis deviation. Nonspecific ST and T-wave ____________________________________________  RADIOLOGY All Xrays were viewed by me. Imaging interpreted by Radiologist.  Chest x-ray two-view:  IMPRESSION: Hyperinflation consistent with emphysema.  Architectural distortion with interstitial scarring peripheral right lung with nodularity in the right mid lung potentially related to changes seen on recent CT scan. Follow-up recommended to ensure stability. __________________________________________  PROCEDURES  Procedure(s) performed: None  Critical Care performed: None  ____________________________________________   ED COURSE / ASSESSMENT AND PLAN  Pertinent labs & imaging results that were available during my care of the patient were reviewed by me and considered in my medical decision making (see chart for details).    Mckenzie Mclaughlin is here with cough and shortness of breath and reported low-grade fever. No hypoxia to home 2 L nasal cannula. She is overall well-appearing, but given the cough and reported low-grade fevers, I am going to cover her with antibiotics for upper respiratory infection in the setting of COPD exacerbation. She reports allergy to amoxicillin and doxycycline, but states that she can tolerate a Z-Pak.  No evidence for sepsis clinically. I think she is okay for outpatient management, and we discussed return precautions. I do want her to be seen this week in a couple days for reevaluation by her primary care physician's office.    CONSULTATIONS:   None   Patient / Family / Caregiver informed of clinical course, medical  decision-making process, and agree with plan.   I discussed return precautions, follow-up instructions, and discharge instructions with patient and/or family.   ___________________________________________   FINAL CLINICAL IMPRESSION(S) / ED DIAGNOSES   Final diagnoses:  COPD exacerbation ([place])              Note: This dictation was prepared with Dragon dictation. Any transcriptional errors that result from this process are unintentional    Lisa Roca, MD 02/03/16 1216

## 2016-02-04 ENCOUNTER — Ambulatory Visit (INDEPENDENT_AMBULATORY_CARE_PROVIDER_SITE_OTHER): Payer: Medicare Other

## 2016-02-04 DIAGNOSIS — E538 Deficiency of other specified B group vitamins: Secondary | ICD-10-CM | POA: Diagnosis not present

## 2016-02-04 MED ORDER — CYANOCOBALAMIN 1000 MCG/ML IJ SOLN
1000.0000 ug | Freq: Once | INTRAMUSCULAR | Status: AC
  Administered 2016-02-04: 1000 ug via INTRAMUSCULAR

## 2016-02-07 ENCOUNTER — Encounter: Payer: Self-pay | Admitting: Family Medicine

## 2016-02-07 ENCOUNTER — Ambulatory Visit (INDEPENDENT_AMBULATORY_CARE_PROVIDER_SITE_OTHER): Payer: Medicare Other | Admitting: Family Medicine

## 2016-02-07 DIAGNOSIS — J441 Chronic obstructive pulmonary disease with (acute) exacerbation: Secondary | ICD-10-CM

## 2016-02-07 NOTE — Progress Notes (Signed)
Pre visit review using our clinic review tool, if applicable. No additional management support is needed unless otherwise documented below in the visit note. 

## 2016-02-07 NOTE — Patient Instructions (Signed)
Keep going with your regular meds and update me if you get worse in the next few days.  Take care.  Glad to see you.

## 2016-02-07 NOTE — Progress Notes (Signed)
Pulmonary nodules per pulmonary.  D/w pt.  I will defer f/u imgaging to pulmonary.   ER f/u.  She was seen in the emergency room, started on antibiotics and prednisone and discharged for outpatient follow-up. In the meantime she is still having yellow sputum, but a little less than prev.  she is clearing sputum better.  She can get a better deep breath now.  Still on O2.  No swelling in ankles.  Still with some wheeze.  Today is last day of abx and prednisone.    Meds, vitals, and allergies reviewed.   ROS: Per HPI unless specifically indicated in ROS section   GEN: nad, alert and oriented, chronically ill-appearing at baseline. HEENT: mucous membranes moist NECK: supple w/o LA CV: rrr. PULM: coarse BS bilaterally, no inc wob, still on O2 ABD: soft, +bs EXT: no edema SKIN: no acute rash, some bruising noted on the extensor surface of the bilateral hands

## 2016-02-09 NOTE — Assessment & Plan Note (Signed)
She still has significant oxygen dependent COPD but at this point does not appear to need inpatient treatment. She is likely some better than she was on initial presentation at the emergency room. Finish the antibiotics and prednisone today. Continue oxygen and inhalers. She will update me as needed. She does have a tenuous situation with her respiratory status. She can easily get profoundly sick. Discussed with patient. Reaffirmed her previous wishes regarding advanced directives, discussed with patient again. Reprinted below. Advance directive- husband designated if patient were incapacitated.  Noted that if patient were acutely ill with the chance of recovery, then she would be okay with short term intensive treatment.  She would want to avoid long term intervention.  For example, if PNA and would need vent support of potential short duration with chance of recovery, then okay to proceed with intubation.  >25 minutes spent in face to face time with patient, >50% spent in counselling or coordination of care.

## 2016-02-10 ENCOUNTER — Other Ambulatory Visit: Payer: Self-pay

## 2016-02-10 NOTE — Telephone Encounter (Signed)
Last filled 01-06-16 #90 Last OV Acute 02-07-16 Last Routine OV 12-19-15

## 2016-02-11 MED ORDER — ALPRAZOLAM 0.5 MG PO TABS: 0.5000 mg | ORAL_TABLET | Freq: Three times a day (TID) | ORAL | 5 refills | Status: DC | PRN

## 2016-02-11 NOTE — Telephone Encounter (Signed)
Please call in.  Thanks.   

## 2016-02-11 NOTE — Telephone Encounter (Signed)
Medication phoned to pharmacy.  

## 2016-02-14 DIAGNOSIS — J449 Chronic obstructive pulmonary disease, unspecified: Secondary | ICD-10-CM | POA: Diagnosis not present

## 2016-02-14 DIAGNOSIS — G4733 Obstructive sleep apnea (adult) (pediatric): Secondary | ICD-10-CM | POA: Diagnosis not present

## 2016-02-14 DIAGNOSIS — I509 Heart failure, unspecified: Secondary | ICD-10-CM | POA: Diagnosis not present

## 2016-02-23 DIAGNOSIS — J449 Chronic obstructive pulmonary disease, unspecified: Secondary | ICD-10-CM | POA: Diagnosis not present

## 2016-02-23 DIAGNOSIS — I509 Heart failure, unspecified: Secondary | ICD-10-CM | POA: Diagnosis not present

## 2016-02-23 DIAGNOSIS — G4733 Obstructive sleep apnea (adult) (pediatric): Secondary | ICD-10-CM | POA: Diagnosis not present

## 2016-02-23 IMAGING — CR DG CHEST 2V
2 series · 2 of 2 positions shown · non-contrast
Comparison: 08/22/2014.

CLINICAL DATA: Chest pain. RIGHT arm pain radiating into the neck
and back with onset of symptoms at 9 the in this morning. Initial
encounter.

EXAM:
CHEST  2 VIEW

[chest pa]
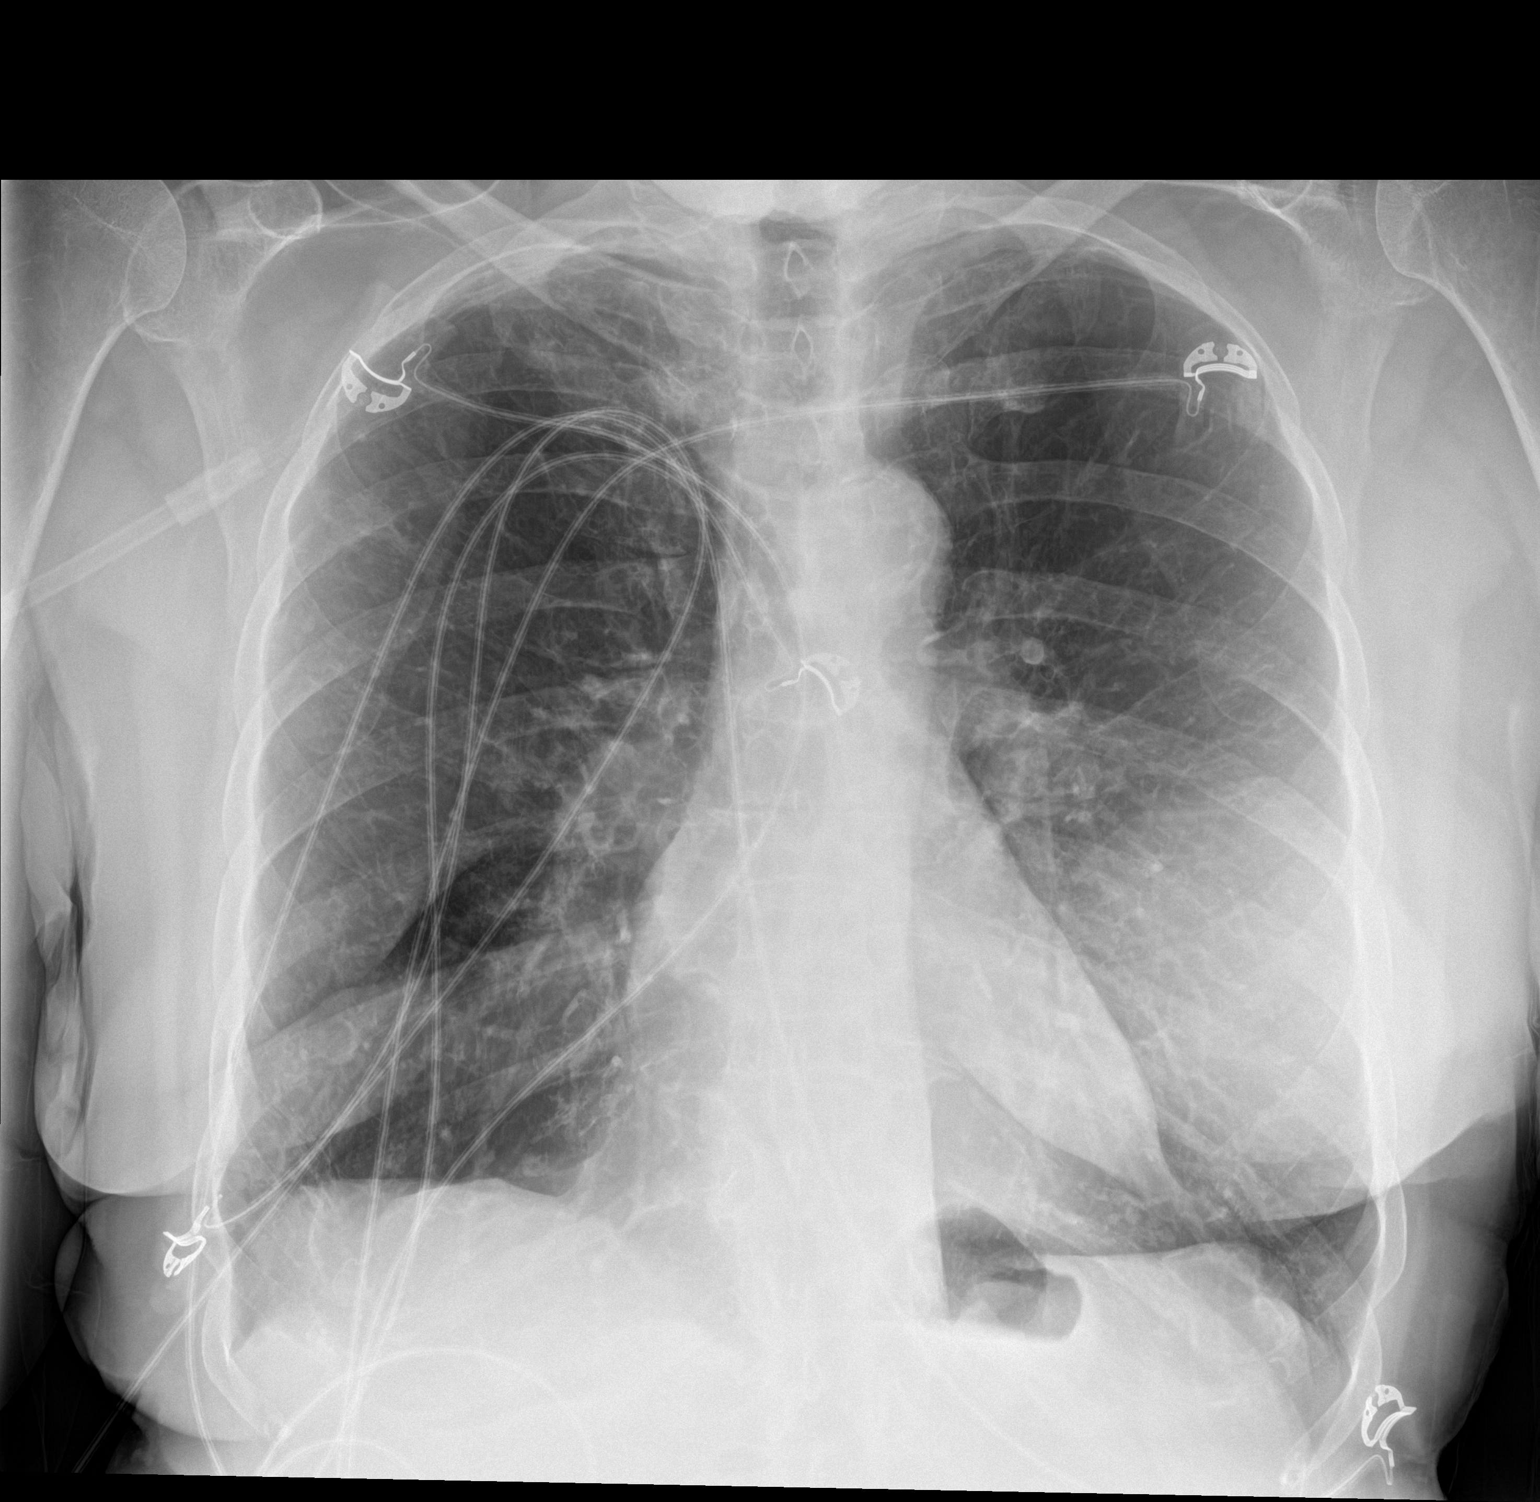

[chest lat]
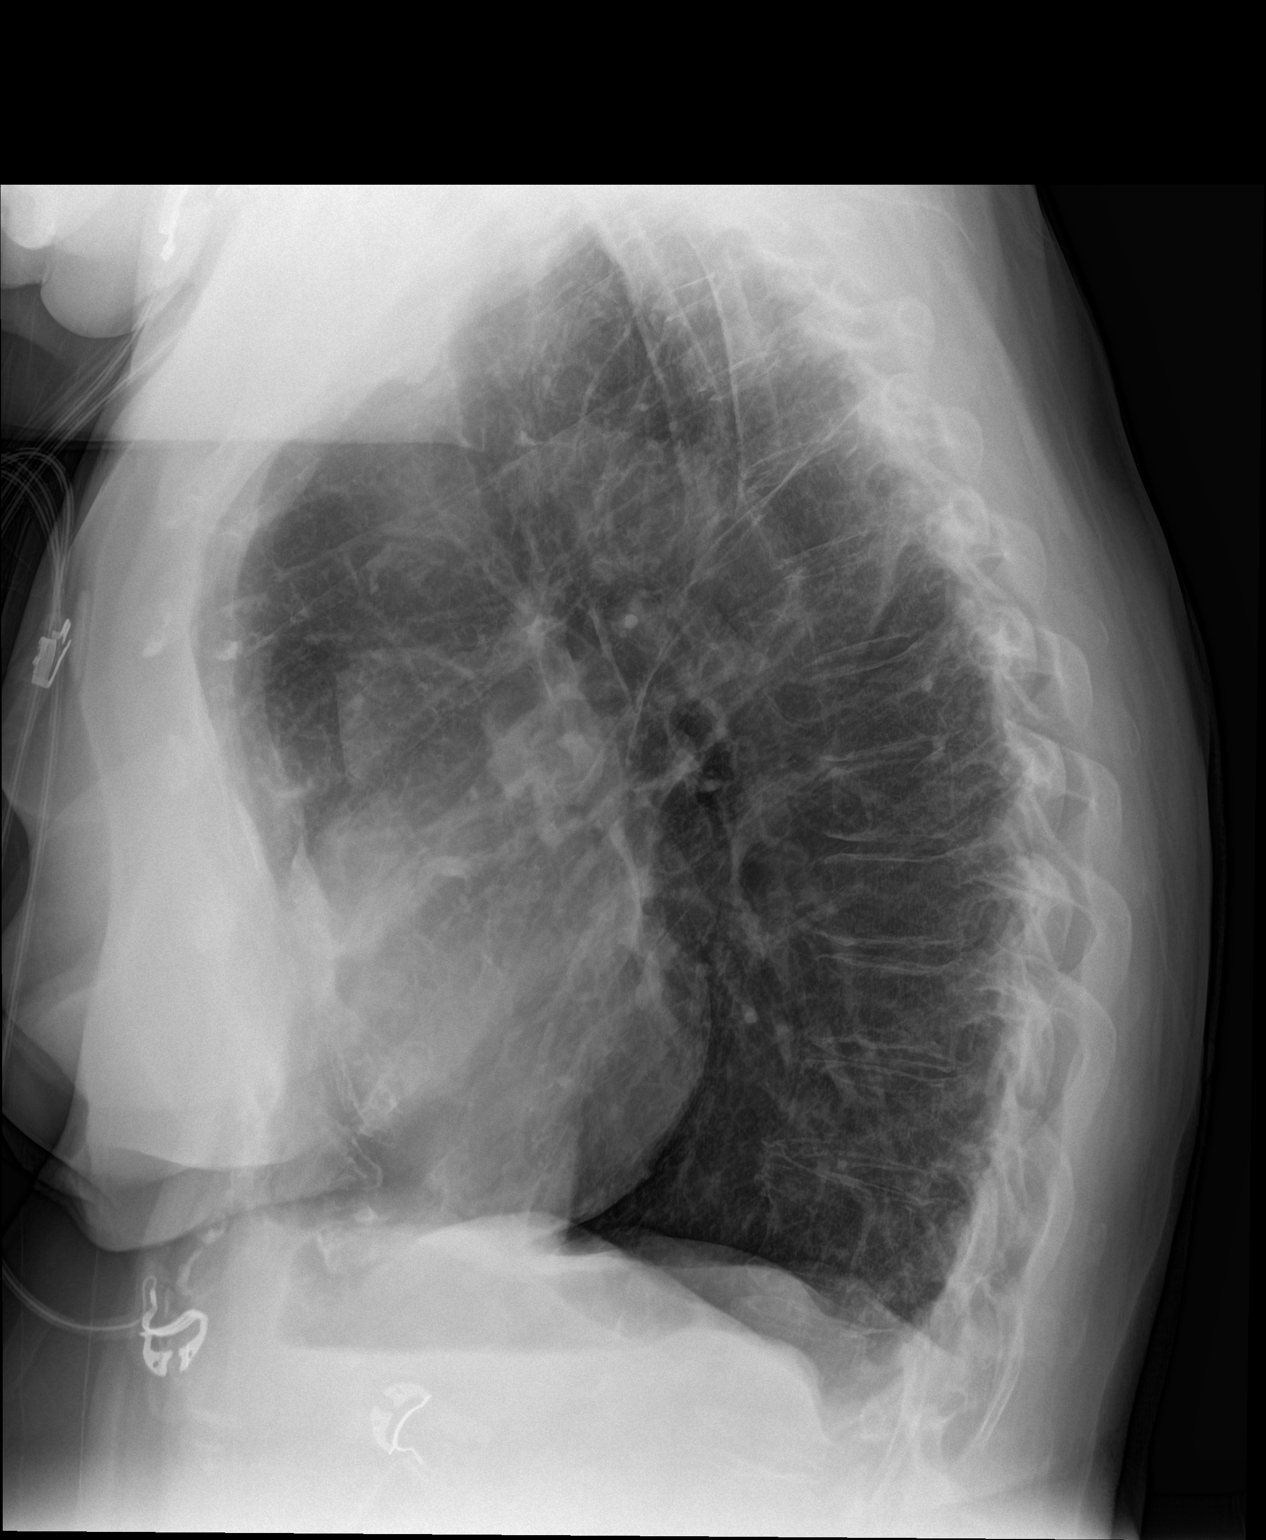

[2 of 2 positions shown; findings below may reference images not displayed]

FINDINGS: Cardiopericardial silhouette is within normal limits. RIGHT
mastectomy. LEFT-sided breast implant. Emphysema is present with
hyper expansion of the chest.

There is a new mass projecting over the cardiopericardial silhouette
on the lateral projection. No density was present on prior chest
radiographs to account for this opacity. The mass measures 4.4 cm.
Although this is adjacent to the breast implant and soft tissue
irregularity/scarring associated with RIGHT mastectomy, the
appearance is suspicious for pulmonary mass lesion and a followup
chest CT with contrast is recommended.
IMPRESSION: 1. 4 cm mass projecting over the retrosternal fat pad and anterior
mediastinum. Follow-up contrast-enhanced chest CT recommended for
further assessment.
2. Severe emphysema.
3. RIGHT mastectomy.

## 2016-02-23 IMAGING — CT CT ABD-PELV W/ CM
1 of 3 series · 12 of 32 positions shown, 15 images · IV contrast (APPLIED)
Comparison: Chest radiograph, 11/12/2014. Chest CT, 08/22/2014 and
abdomen pelvis CT, 03/25/2014.

CLINICAL DATA: Right upper quadrant pain.  New chest mass.

EXAM:
CT ANGIOGRAPHY CHEST
CT ABDOMEN AND PELVIS WITH CONTRAST
TECHNIQUE: Multidetector CT imaging of the chest was performed using the
standard protocol during bolus administration of intravenous
contrast. Multiplanar CT image reconstructions and MIPs were
obtained to evaluate the vascular anatomy. Multidetector CT imaging
of the abdomen and pelvis was performed using the standard protocol
during bolus administration of intravenous contrast.
CONTRAST:  100mL OMNIPAQUE IOHEXOL 350 MG/ML SOLN

[Series 12: pe 1.0 thins · axial · 0.88mm/px · z∈[-378,-80]mm · 12 of 343 slices shown, 15 images]
[im 22/343  mediastinal]
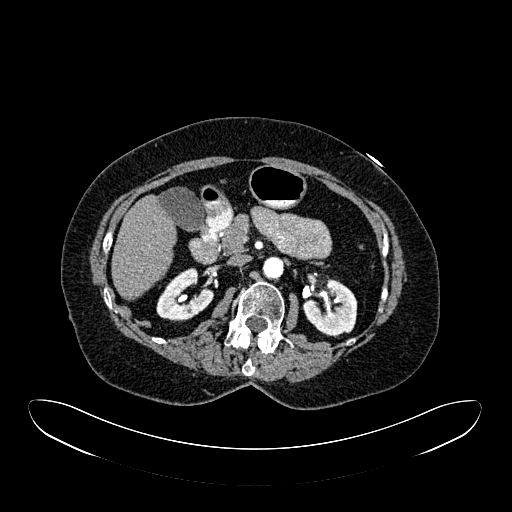
[im 22/343  lung]
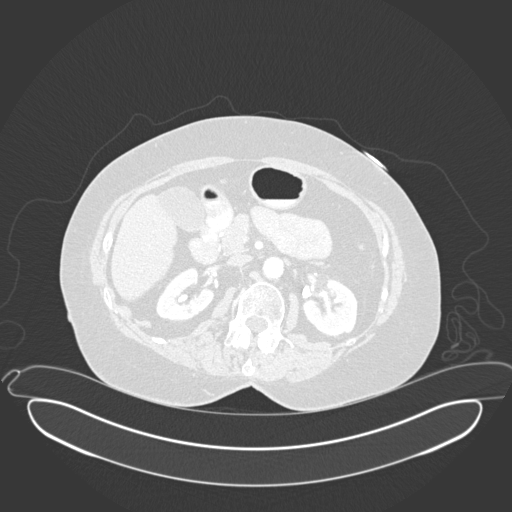
[im 46/343  lung]
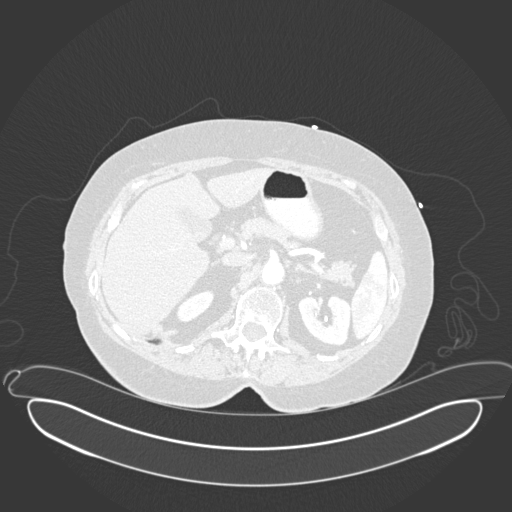
[im 69/343  lung]
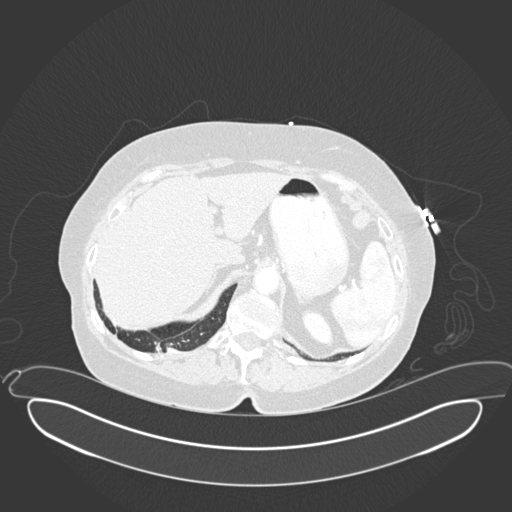
[im 92/343  lung]
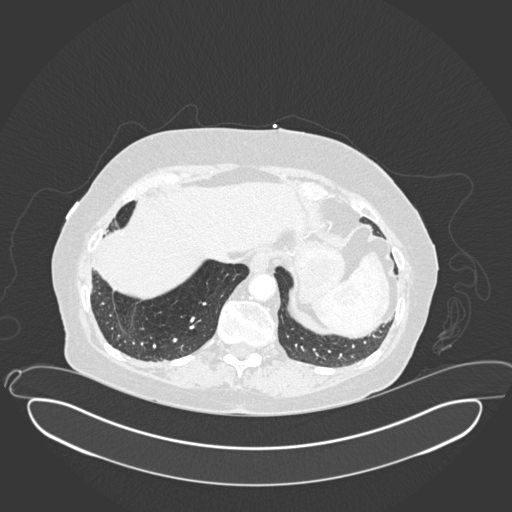
[im 137/343  mediastinal]
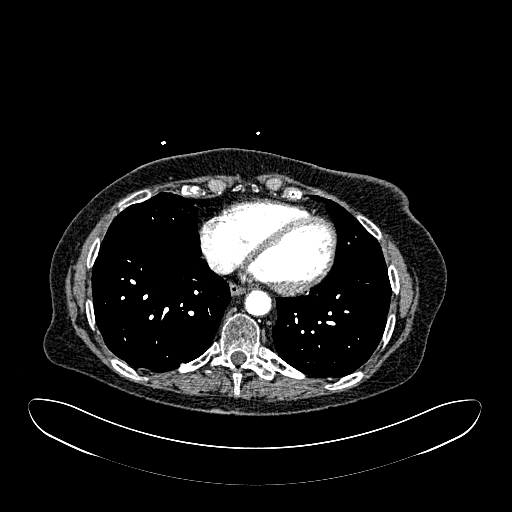
[im 137/343  lung]
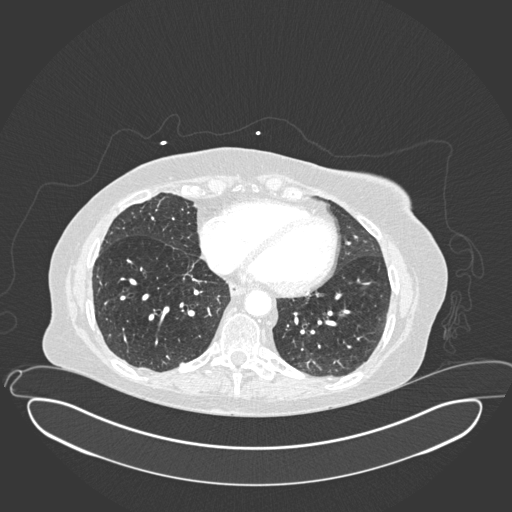
[im 160/343  lung]
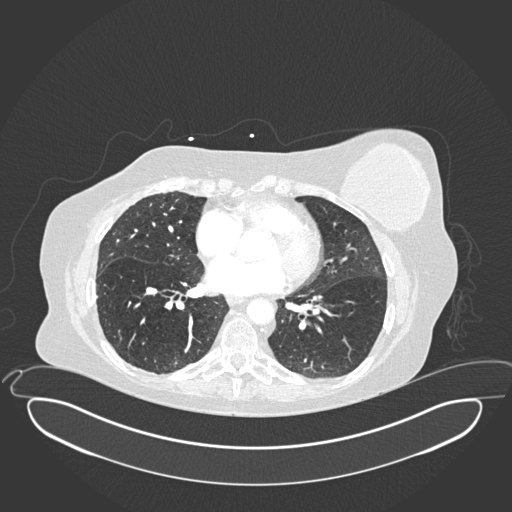
[im 172/343  lung]
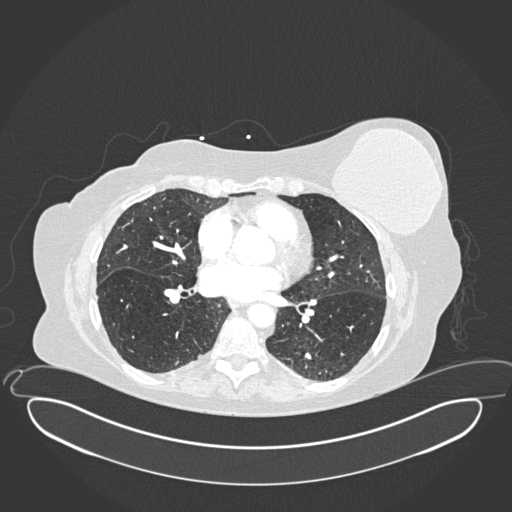
[im 206/343  lung]
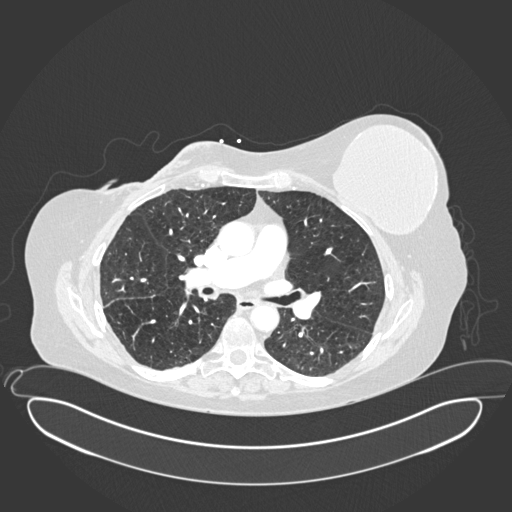
[im 229/343  mediastinal]
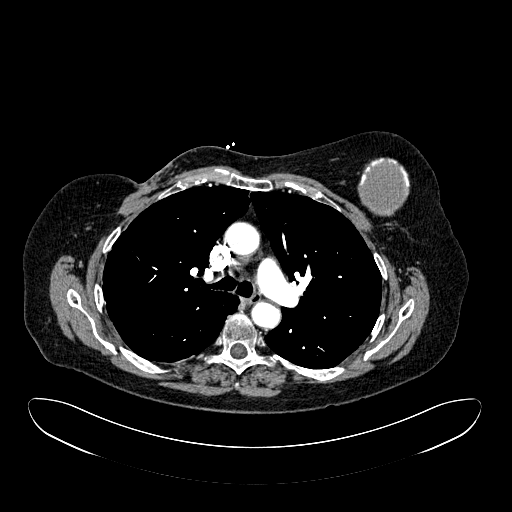
[im 229/343  lung]
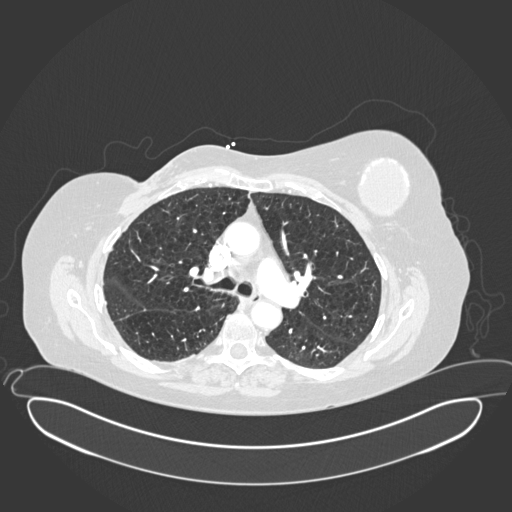
[im 251/343  lung]
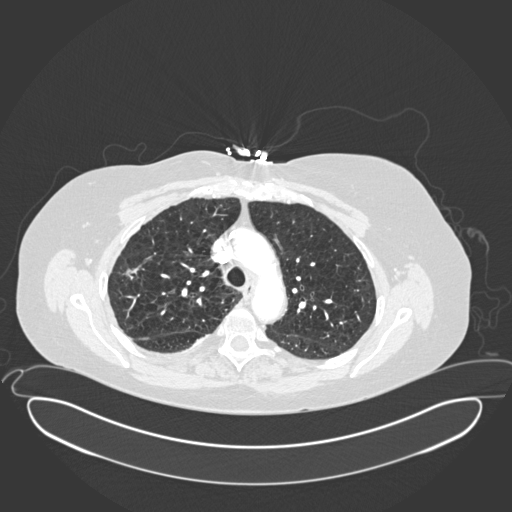
[im 297/343  lung]
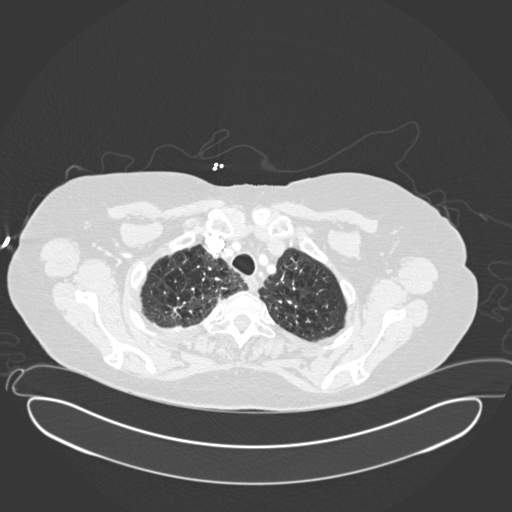
[im 320/343  lung]
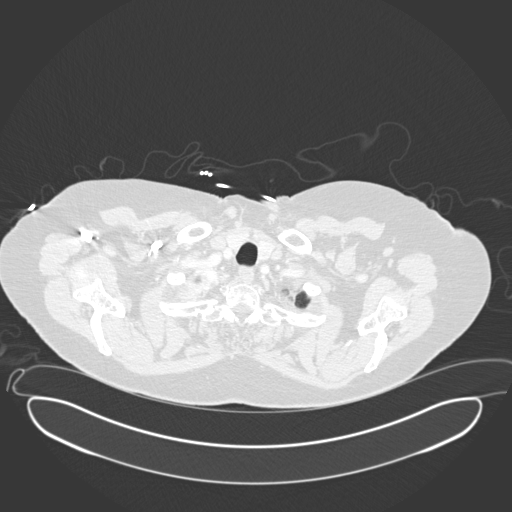

[12 of 32 positions shown; findings below may reference images not displayed]

FINDINGS: CTA CHEST FINDINGS

Angiographic study: No evidence of a pulmonary embolus. Great
vessels normal in caliber. No aortic dissection. There is
atherosclerotic plaque and calcifications along the thoracic aorta
and at the origins of the innominate and left subclavian arteries.

Thoracic inlet: No masses or adenopathy. Visualized thyroid is
unremarkable.

Mediastinum and hila: Heart is normal in size and configuration.
There are mild coronary artery calcifications. No mediastinal or
hilar masses or pathologically enlarged lymph nodes.

Lungs and pleura: There is no lung mass or focal opacity to account
for the retrosternal opacity noted on the current chest radiograph.
There are multiple areas of lung scarring which are stable. In the
right upper lobe near the apex there is an 8.3 mm irregular focal
opacity which is stable from the prior CT. Below this, also in the
right upper lobe, on image 47, series 13, there is another focal
area of opacity measuring 13 mm in greatest dimension, without
significant change from the prior study allowing for differences in
measurement technique. All other areas of reticular or focal opacity
are also stable most consistent with areas of scarring. There are no
areas of lung consolidation. There is no pulmonary edema. Moderate
to advanced emphysema is noted most evident in the upper lobes. No
pleural effusion or pneumothorax.

Chest wall: There are changes consistent with right breast surgery
stable from the prior CT. A retroglandular implant is noted on the
left. No discrete chest wall mass or change from the prior exam.

CT ABDOMEN and PELVIS FINDINGS

Liver: 3 small low-density lesions, largest in the left lobe
measuring 13 mm, the other 2 in the left lobe lateral segment, sub
cm, all likely cysts and all stable. No other liver abnormality.

Gallbladder and biliary tree: Small amount of increased density
material in the dependent gallbladder consistent with sludge or
small layering stones, stable. No gallbladder wall thickening or
adjacent inflammation. No bile duct dilation.

Spleen, pancreas, adrenal glands:  Unremarkable.

Kidneys, ureters, bladder: 4 mm cyst in the posterior midpole the
right kidney. No other renal masses. Normal renal enhancement and
excretion. No hydronephrosis. Ureters normal in course and in
caliber. Bladder is unremarkable.

Uterus and adnexa:  Uterus surgically absent.  No adnexal masses.

Lymph nodes:  No adenopathy.

Ascites:  None.

Gastrointestinal: Mild generalized increased stool burden throughout
the colon. No colonic wall thickening or inflammation. No evidence
of obstruction. Small bowel is unremarkable. Normal appearance of
the stomach. Normal appendix visualized.

Vascular: There is atherosclerotic plaque along the aorta and its
branch vessels. No significant stenosis. No aneurysm

MUSCULOSKELETAL

There is a mild compression fracture of T8 with depression of the
upper endplate and adjacent sclerosis. This was not present on the
prior CT chest dated 08/22/2014. No other fractures. No osteoblastic
or osteolytic lesions.

Review of the MIP images confirms the above findings.
IMPRESSION: 1. No evidence of a pulmonary embolus.
2. The questionable mass suggested on the current chest radiographs
in the retrosternal area on the lateral view, is not defined on CT.
There is no lung mass or focal area of consolidation. This opacity
may have been due to superimposed structures on the lateral view,
including changes from right breast surgery.
3. There are no acute findings in the lungs. Irregular areas of
focal opacity noted on the prior chest CT are stable. No convincing
neoplastic disease in the chest. Stable changes of emphysema.
4. No findings in the abdomen or pelvis to explain right upper
quadrant pain.
5. Probable gallstones but no evidence of acute cholecystitis.
6. Normal appendix visualized.
7. Musculoskeletal evaluation demonstrates a mild compression
fracture of T8 which is new from the prior chest CT. This could be
the source of back pain or radiating pain. No other fractures.

## 2016-03-03 DIAGNOSIS — R05 Cough: Secondary | ICD-10-CM | POA: Diagnosis not present

## 2016-03-03 IMAGING — CR DG CERVICAL SPINE COMPLETE 4+V
6 series · 6 of 6 positions shown · non-contrast
Comparison: None in PACs

CLINICAL DATA: Two weeks of right-sided neck shoulder and arm pain
without history of trauma; patient had difficulty with positioning.

EXAM:
CERVICAL SPINE  4+ VIEWS

[view not recorded (1 of 6)]
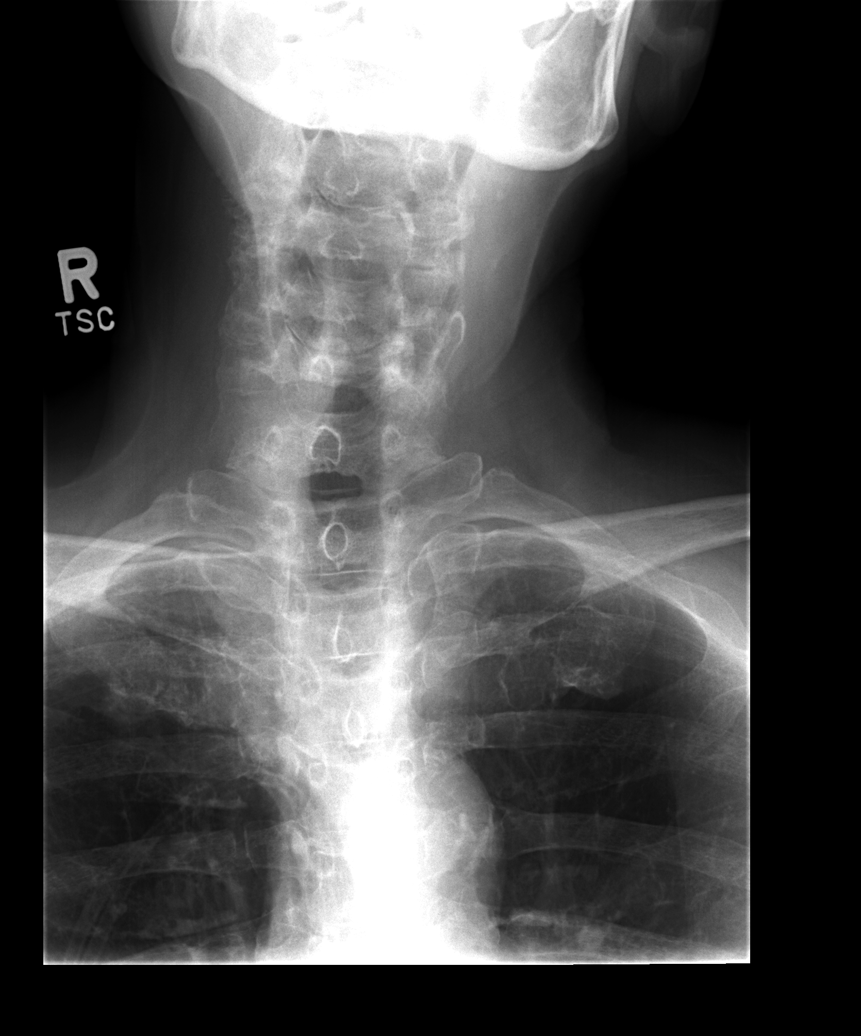

[view not recorded (2 of 6)]
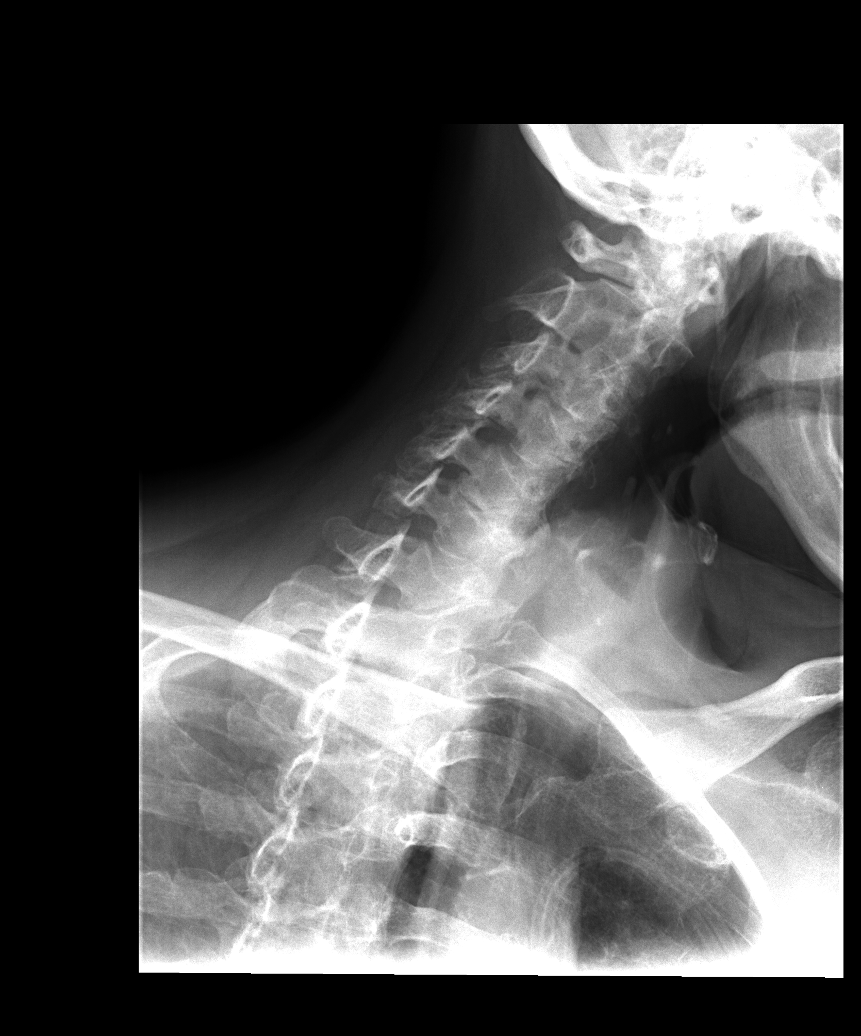

[view not recorded (3 of 6)]
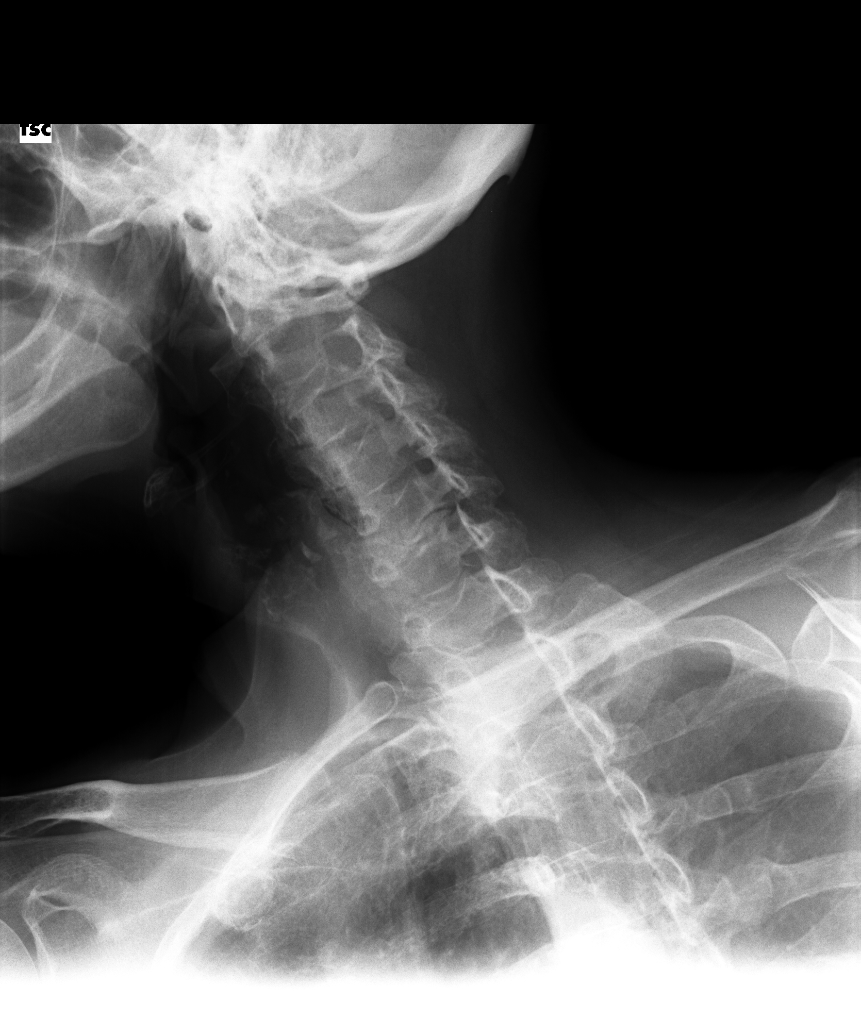

[view not recorded (4 of 6)]
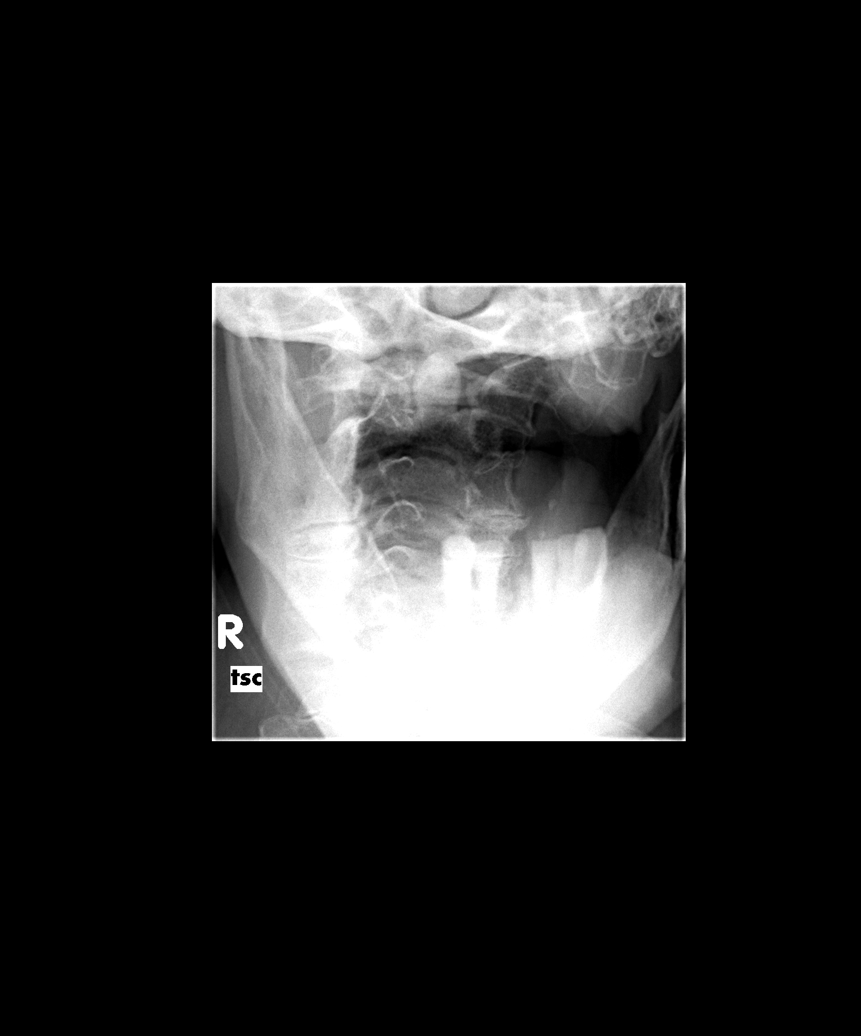

[view not recorded (5 of 6)]
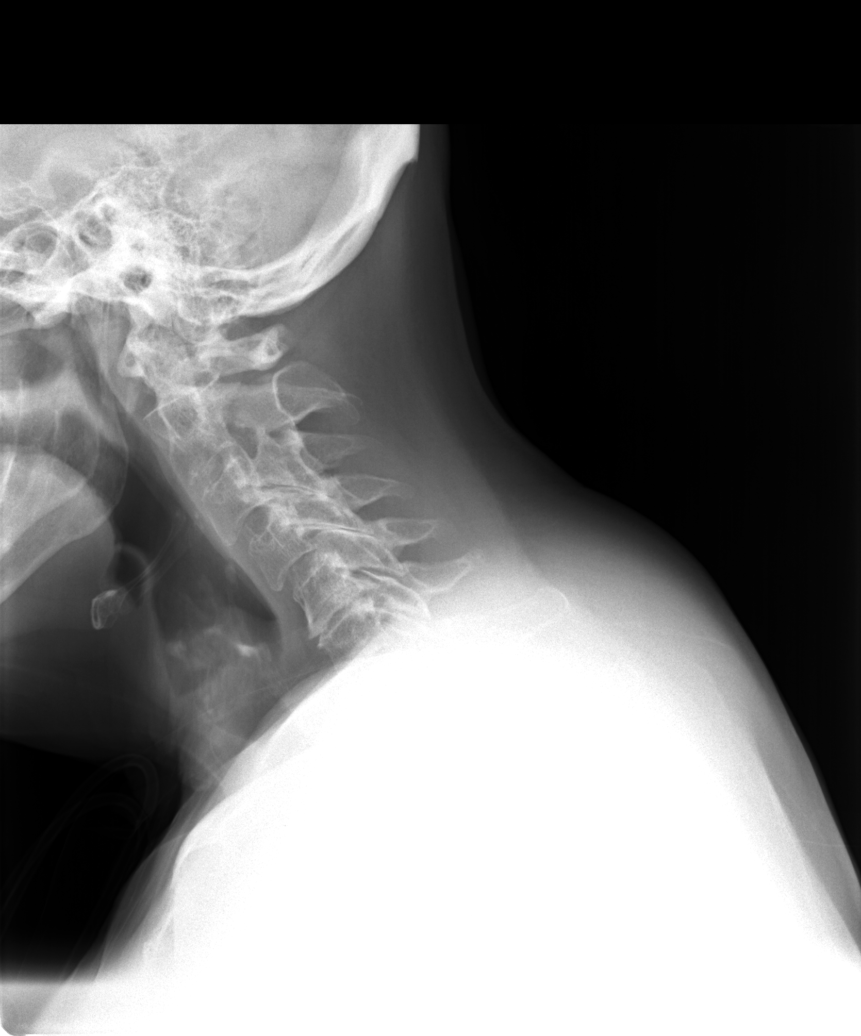

[view not recorded (6 of 6)]
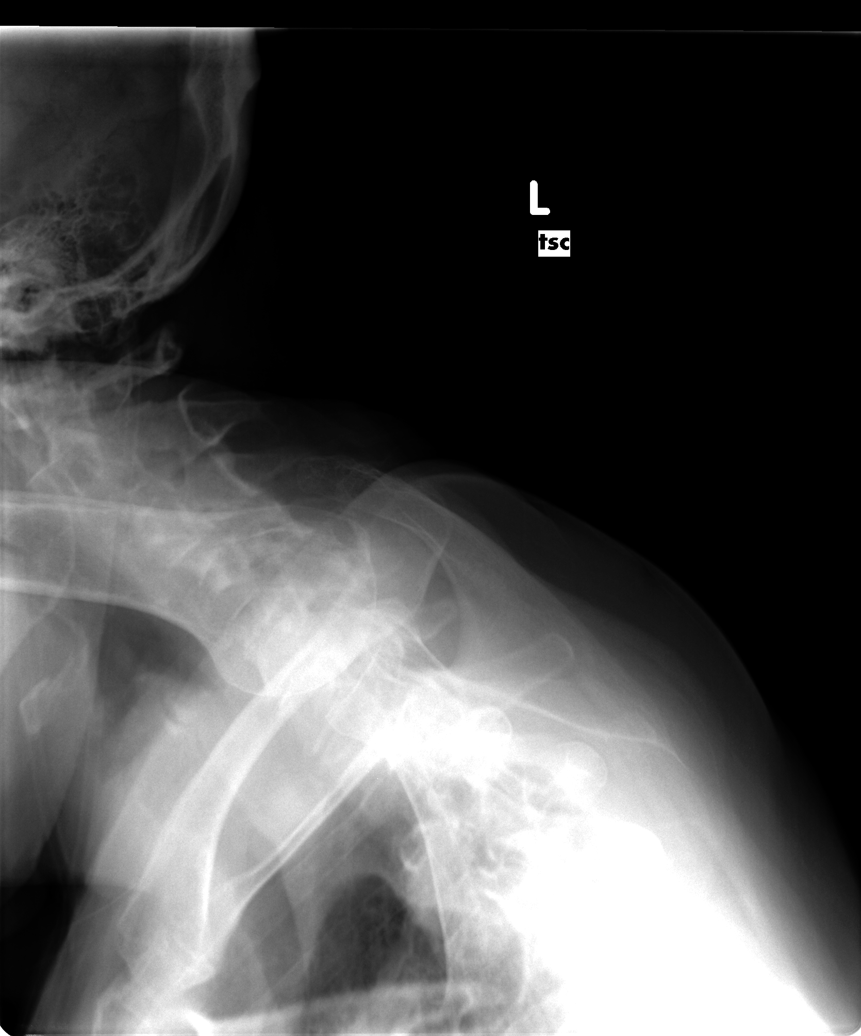

[6 of 6 positions shown; findings below may reference images not displayed]

FINDINGS: The cervical vertebral bodies are preserved in height. There is disc
space narrowing at C5-6 and at C6-7. There are anterior and
posterior endplate osteophytes here. There is grade 1
anterolisthesis of C4 with respect to C5 with the degree of slippage
approximately 3 mm. There is multilevel facet joint hypertrophy.
There is multilevel bilateral bony neural foraminal encroachment.
The odontoid is grossly intact. The prevertebral soft tissue spaces
are normal.
IMPRESSION: The study is limited due to the patient's discomfort. There is
moderate degenerative disc disease centered at C5-6 and C6-7. There
is moderate multilevel bilateral facet joint hypertrophy with
encroachment upon the neural foramina. Cervical spine MRI would be
useful to assess the status of the spinal canal and the nerve roots.

## 2016-03-05 ENCOUNTER — Ambulatory Visit (INDEPENDENT_AMBULATORY_CARE_PROVIDER_SITE_OTHER): Payer: Medicare Other

## 2016-03-05 DIAGNOSIS — E538 Deficiency of other specified B group vitamins: Secondary | ICD-10-CM | POA: Diagnosis not present

## 2016-03-05 MED ORDER — CYANOCOBALAMIN 1000 MCG/ML IJ SOLN
1000.0000 ug | Freq: Once | INTRAMUSCULAR | Status: AC
  Administered 2016-03-05: 1000 ug via INTRAMUSCULAR

## 2016-03-15 DIAGNOSIS — G4733 Obstructive sleep apnea (adult) (pediatric): Secondary | ICD-10-CM | POA: Diagnosis not present

## 2016-03-15 DIAGNOSIS — J449 Chronic obstructive pulmonary disease, unspecified: Secondary | ICD-10-CM | POA: Diagnosis not present

## 2016-03-15 DIAGNOSIS — I509 Heart failure, unspecified: Secondary | ICD-10-CM | POA: Diagnosis not present

## 2016-03-17 DIAGNOSIS — Z9889 Other specified postprocedural states: Secondary | ICD-10-CM | POA: Diagnosis not present

## 2016-03-17 DIAGNOSIS — R079 Chest pain, unspecified: Secondary | ICD-10-CM | POA: Diagnosis not present

## 2016-03-17 DIAGNOSIS — I10 Essential (primary) hypertension: Secondary | ICD-10-CM | POA: Diagnosis not present

## 2016-03-17 DIAGNOSIS — I493 Ventricular premature depolarization: Secondary | ICD-10-CM | POA: Diagnosis not present

## 2016-03-17 DIAGNOSIS — I2510 Atherosclerotic heart disease of native coronary artery without angina pectoris: Secondary | ICD-10-CM | POA: Diagnosis not present

## 2016-03-24 DIAGNOSIS — G4733 Obstructive sleep apnea (adult) (pediatric): Secondary | ICD-10-CM | POA: Diagnosis not present

## 2016-03-24 DIAGNOSIS — I509 Heart failure, unspecified: Secondary | ICD-10-CM | POA: Diagnosis not present

## 2016-03-24 DIAGNOSIS — J449 Chronic obstructive pulmonary disease, unspecified: Secondary | ICD-10-CM | POA: Diagnosis not present

## 2016-04-06 ENCOUNTER — Telehealth: Payer: Self-pay

## 2016-04-06 NOTE — Telephone Encounter (Signed)
Manuela Schwartz, at scheduling dept, called stating pt presented today for CT urogram. Manuela Schwartz wanted to make Dr. Erlene Quan aware pt c/o not feeling good and being dizzy after last CT performed. Per Manuela Schwartz pt denied passing out, n/v, SOB post CT.

## 2016-04-08 ENCOUNTER — Ambulatory Visit (INDEPENDENT_AMBULATORY_CARE_PROVIDER_SITE_OTHER): Payer: Medicare Other

## 2016-04-08 DIAGNOSIS — E538 Deficiency of other specified B group vitamins: Secondary | ICD-10-CM

## 2016-04-08 MED ORDER — CYANOCOBALAMIN 1000 MCG/ML IJ SOLN
1000.0000 ug | Freq: Once | INTRAMUSCULAR | Status: AC
  Administered 2016-04-08: 1000 ug via INTRAMUSCULAR

## 2016-04-14 DIAGNOSIS — R0902 Hypoxemia: Secondary | ICD-10-CM | POA: Diagnosis not present

## 2016-04-14 DIAGNOSIS — R05 Cough: Secondary | ICD-10-CM | POA: Diagnosis not present

## 2016-04-14 DIAGNOSIS — R0609 Other forms of dyspnea: Secondary | ICD-10-CM | POA: Diagnosis not present

## 2016-04-14 DIAGNOSIS — J439 Emphysema, unspecified: Secondary | ICD-10-CM | POA: Diagnosis not present

## 2016-04-14 DIAGNOSIS — R918 Other nonspecific abnormal finding of lung field: Secondary | ICD-10-CM | POA: Diagnosis not present

## 2016-04-15 DIAGNOSIS — G4733 Obstructive sleep apnea (adult) (pediatric): Secondary | ICD-10-CM | POA: Diagnosis not present

## 2016-04-15 DIAGNOSIS — J449 Chronic obstructive pulmonary disease, unspecified: Secondary | ICD-10-CM | POA: Diagnosis not present

## 2016-04-15 DIAGNOSIS — I509 Heart failure, unspecified: Secondary | ICD-10-CM | POA: Diagnosis not present

## 2016-04-17 ENCOUNTER — Ambulatory Visit: Payer: Medicare Other | Admitting: Urology

## 2016-04-20 ENCOUNTER — Telehealth: Payer: Self-pay | Admitting: Urology

## 2016-04-20 NOTE — Telephone Encounter (Signed)
Patient called the office this afternoon to let us know that she does not want to have the contrast for her CT hematuria workup that is scheduled for tomorrow.    She says that the contrast makes her sick on her stomach, dizzy and headache for days after the scan.   Please advise on how we should proceed.

## 2016-04-21 ENCOUNTER — Ambulatory Visit: Admission: RE | Admit: 2016-04-21 | Payer: Medicare Other | Source: Ambulatory Visit

## 2016-04-21 NOTE — Telephone Encounter (Signed)
Lengthy discussion with the patient regarding options. Unfortunately, I do not feel that she is a good candidate for the operating room due to her pulmonary status therefore cystoscopy, retrogrades somewhat higher risk.  Without IV contrast, we will not be able to follow this upper tract abnormality and assess for progression.  Reaction to contrast is nonspecific and nonlife threatening. As such, we discussed the risk and benefits in detail.  She is willing to reschedule the study and will follow up thereafter.  Hollice Espy, MD

## 2016-04-22 ENCOUNTER — Encounter: Payer: Self-pay | Admitting: Urology

## 2016-04-22 ENCOUNTER — Ambulatory Visit: Payer: Medicare Other | Admitting: Urology

## 2016-04-23 NOTE — Telephone Encounter (Signed)
I will need a new order for the CT scan you want her to have.  thanks

## 2016-04-24 DIAGNOSIS — I509 Heart failure, unspecified: Secondary | ICD-10-CM | POA: Diagnosis not present

## 2016-04-24 DIAGNOSIS — G4733 Obstructive sleep apnea (adult) (pediatric): Secondary | ICD-10-CM | POA: Diagnosis not present

## 2016-04-24 DIAGNOSIS — J449 Chronic obstructive pulmonary disease, unspecified: Secondary | ICD-10-CM | POA: Diagnosis not present

## 2016-04-25 NOTE — Telephone Encounter (Signed)
I can still see my previous order.  Is that not sufficient?  Hollice Espy, MD

## 2016-04-27 NOTE — Telephone Encounter (Signed)
Not if she doesn't want to have the contrast dye. She said that she refuses to have contrast. I am guessing that I would need another kind of order? But I may be wrong?

## 2016-04-27 NOTE — Telephone Encounter (Signed)
She is having it with contrast.    Hollice Espy, MD

## 2016-04-27 NOTE — Telephone Encounter (Signed)
See my note below. I did actually have a lengthy conversation with her on 04/21/16 regarding this exact issue. We discussed that without contrast, the CT scan will be useless. We also discussed that there is no other safe way to visualize her upper tract without the contrast. She agreed to have the study with contrast at that time. Her she change her mind? If so, she just needs to come into the office I can document this again.  Hollice Espy, MD

## 2016-04-27 NOTE — Telephone Encounter (Signed)
She told me that she will not do the CT scan if we do it with contrast? Maybe you need to call her?  Sharyn Lull

## 2016-04-28 NOTE — Telephone Encounter (Signed)
Ok I just spoke with her and she has agreed to move forward with the CT scan that you ordered. I have transferred her over to the scheduling dept to reschd this appt for the scan and I will then schd her a follow up with you.   Thanks,  Sharyn Lull

## 2016-04-28 NOTE — Telephone Encounter (Signed)
I guess she did, but I will call her again today and ask her one more time. If she says no again I will make her an app to come in to see you.

## 2016-05-11 ENCOUNTER — Ambulatory Visit: Admission: RE | Admit: 2016-05-11 | Payer: Medicare Other | Source: Ambulatory Visit

## 2016-05-14 ENCOUNTER — Emergency Department: Payer: Medicare Other

## 2016-05-14 ENCOUNTER — Observation Stay
Admission: EM | Admit: 2016-05-14 | Discharge: 2016-05-15 | Disposition: A | Discharge status: Skilled Nursing Facility | Payer: Medicare Other | Attending: Internal Medicine | Admitting: Internal Medicine

## 2016-05-14 ENCOUNTER — Encounter: Payer: Self-pay | Admitting: Emergency Medicine

## 2016-05-14 DIAGNOSIS — I493 Ventricular premature depolarization: Secondary | ICD-10-CM | POA: Diagnosis not present

## 2016-05-14 DIAGNOSIS — K219 Gastro-esophageal reflux disease without esophagitis: Secondary | ICD-10-CM | POA: Diagnosis not present

## 2016-05-14 DIAGNOSIS — E538 Deficiency of other specified B group vitamins: Secondary | ICD-10-CM | POA: Diagnosis not present

## 2016-05-14 DIAGNOSIS — I700 Atherosclerosis of aorta: Secondary | ICD-10-CM | POA: Insufficient documentation

## 2016-05-14 DIAGNOSIS — I2510 Atherosclerotic heart disease of native coronary artery without angina pectoris: Secondary | ICD-10-CM | POA: Diagnosis not present

## 2016-05-14 DIAGNOSIS — N952 Postmenopausal atrophic vaginitis: Secondary | ICD-10-CM | POA: Insufficient documentation

## 2016-05-14 DIAGNOSIS — I252 Old myocardial infarction: Secondary | ICD-10-CM | POA: Diagnosis not present

## 2016-05-14 DIAGNOSIS — F329 Major depressive disorder, single episode, unspecified: Secondary | ICD-10-CM | POA: Diagnosis not present

## 2016-05-14 DIAGNOSIS — Z880 Allergy status to penicillin: Secondary | ICD-10-CM | POA: Insufficient documentation

## 2016-05-14 DIAGNOSIS — Z87891 Personal history of nicotine dependence: Secondary | ICD-10-CM | POA: Insufficient documentation

## 2016-05-14 DIAGNOSIS — K5900 Constipation, unspecified: Secondary | ICD-10-CM | POA: Diagnosis not present

## 2016-05-14 DIAGNOSIS — Z955 Presence of coronary angioplasty implant and graft: Secondary | ICD-10-CM | POA: Diagnosis not present

## 2016-05-14 DIAGNOSIS — F341 Dysthymic disorder: Secondary | ICD-10-CM | POA: Diagnosis not present

## 2016-05-14 DIAGNOSIS — J449 Chronic obstructive pulmonary disease, unspecified: Secondary | ICD-10-CM | POA: Insufficient documentation

## 2016-05-14 DIAGNOSIS — Z888 Allergy status to other drugs, medicaments and biological substances status: Secondary | ICD-10-CM | POA: Diagnosis not present

## 2016-05-14 DIAGNOSIS — R2681 Unsteadiness on feet: Secondary | ICD-10-CM

## 2016-05-14 DIAGNOSIS — Z90721 Acquired absence of ovaries, unilateral: Secondary | ICD-10-CM | POA: Insufficient documentation

## 2016-05-14 DIAGNOSIS — I10 Essential (primary) hypertension: Secondary | ICD-10-CM | POA: Diagnosis not present

## 2016-05-14 DIAGNOSIS — E876 Hypokalemia: Secondary | ICD-10-CM | POA: Insufficient documentation

## 2016-05-14 DIAGNOSIS — K222 Esophageal obstruction: Secondary | ICD-10-CM | POA: Insufficient documentation

## 2016-05-14 DIAGNOSIS — Z7902 Long term (current) use of antithrombotics/antiplatelets: Secondary | ICD-10-CM | POA: Insufficient documentation

## 2016-05-14 DIAGNOSIS — Z9981 Dependence on supplemental oxygen: Secondary | ICD-10-CM | POA: Diagnosis not present

## 2016-05-14 DIAGNOSIS — F419 Anxiety disorder, unspecified: Secondary | ICD-10-CM | POA: Insufficient documentation

## 2016-05-14 DIAGNOSIS — R42 Dizziness and giddiness: Principal | ICD-10-CM | POA: Insufficient documentation

## 2016-05-14 DIAGNOSIS — Z9842 Cataract extraction status, left eye: Secondary | ICD-10-CM | POA: Insufficient documentation

## 2016-05-14 DIAGNOSIS — Z90710 Acquired absence of both cervix and uterus: Secondary | ICD-10-CM | POA: Insufficient documentation

## 2016-05-14 DIAGNOSIS — Z79899 Other long term (current) drug therapy: Secondary | ICD-10-CM | POA: Insufficient documentation

## 2016-05-14 DIAGNOSIS — R0602 Shortness of breath: Secondary | ICD-10-CM | POA: Diagnosis not present

## 2016-05-14 DIAGNOSIS — Z85828 Personal history of other malignant neoplasm of skin: Secondary | ICD-10-CM | POA: Diagnosis not present

## 2016-05-14 DIAGNOSIS — E41 Nutritional marasmus: Secondary | ICD-10-CM | POA: Diagnosis not present

## 2016-05-14 DIAGNOSIS — Z881 Allergy status to other antibiotic agents status: Secondary | ICD-10-CM | POA: Insufficient documentation

## 2016-05-14 DIAGNOSIS — Z7982 Long term (current) use of aspirin: Secondary | ICD-10-CM | POA: Insufficient documentation

## 2016-05-14 DIAGNOSIS — E785 Hyperlipidemia, unspecified: Secondary | ICD-10-CM | POA: Insufficient documentation

## 2016-05-14 DIAGNOSIS — D692 Other nonthrombocytopenic purpura: Secondary | ICD-10-CM | POA: Insufficient documentation

## 2016-05-14 DIAGNOSIS — Z9841 Cataract extraction status, right eye: Secondary | ICD-10-CM | POA: Insufficient documentation

## 2016-05-14 DIAGNOSIS — R110 Nausea: Secondary | ICD-10-CM | POA: Diagnosis not present

## 2016-05-14 DIAGNOSIS — M6281 Muscle weakness (generalized): Secondary | ICD-10-CM

## 2016-05-14 LAB — URINALYSIS, COMPLETE (UACMP) WITH MICROSCOPIC
BACTERIA UA: NONE SEEN
BILIRUBIN URINE: NEGATIVE
Glucose, UA: NEGATIVE mg/dL
KETONES UR: NEGATIVE mg/dL
NITRITE: NEGATIVE
PROTEIN: NEGATIVE mg/dL
SPECIFIC GRAVITY, URINE: 1.006 (ref 1.005–1.030)
SQUAMOUS EPITHELIAL / LPF: NONE SEEN
pH: 6 (ref 5.0–8.0)

## 2016-05-14 LAB — COMPREHENSIVE METABOLIC PANEL
ALBUMIN: 3.4 g/dL — AB (ref 3.5–5.0)
ALK PHOS: 72 U/L (ref 38–126)
ALT: 20 U/L (ref 14–54)
ANION GAP: 7 (ref 5–15)
AST: 26 U/L (ref 15–41)
BILIRUBIN TOTAL: 0.6 mg/dL (ref 0.3–1.2)
BUN: 19 mg/dL (ref 6–20)
CO2: 34 mmol/L — AB (ref 22–32)
Calcium: 9.3 mg/dL (ref 8.9–10.3)
Chloride: 99 mmol/L — ABNORMAL LOW (ref 101–111)
Creatinine, Ser: 0.84 mg/dL (ref 0.44–1.00)
GFR calc Af Amer: >60 mL/min (ref >60)
GFR calc non Af Amer: >60 mL/min (ref >60)
GLUCOSE: 114 mg/dL — AB (ref 65–99)
POTASSIUM: 3 mmol/L — AB (ref 3.5–5.1)
SODIUM: 140 mmol/L (ref 135–145)
Total Protein: 6.4 g/dL — ABNORMAL LOW (ref 6.5–8.1)

## 2016-05-14 LAB — CBC WITH DIFFERENTIAL/PLATELET
BASOS ABS: 0 10*3/uL (ref 0–0.1)
Basophils Relative: 1 %
EOS ABS: 0.2 10*3/uL (ref 0–0.7)
Eosinophils Relative: 2 %
HEMATOCRIT: 37.8 % (ref 35.0–47.0)
HEMOGLOBIN: 13.3 g/dL (ref 12.0–16.0)
Lymphocytes Relative: 21 %
Lymphs Abs: 2.2 10*3/uL (ref 1.0–3.6)
MCH: 30.8 pg (ref 26.0–34.0)
MCHC: 35.1 g/dL (ref 32.0–36.0)
MCV: 87.8 fL (ref 80.0–100.0)
Monocytes Absolute: 0.9 10*3/uL (ref 0.2–0.9)
Monocytes Relative: 8 %
NEUTROS ABS: 7.2 10*3/uL — AB (ref 1.4–6.5)
NEUTROS PCT: 68 %
Platelets: 227 10*3/uL (ref 150–440)
RBC: 4.31 MIL/uL (ref 3.80–5.20)
RDW: 13.8 % (ref 11.5–14.5)
WBC: 10.5 10*3/uL (ref 3.6–11.0)

## 2016-05-14 LAB — TROPONIN I: Troponin I: <0.03 ng/mL (ref <0.03)

## 2016-05-14 MED ORDER — MECLIZINE HCL 25 MG PO TABS
50.0000 mg | ORAL_TABLET | Freq: Once | ORAL | Status: AC
  Administered 2016-05-14: 50 mg via ORAL
  Filled 2016-05-14: qty 2

## 2016-05-14 MED ORDER — ONDANSETRON HCL 4 MG/2ML IJ SOLN
4.0000 mg | Freq: Once | INTRAMUSCULAR | Status: AC
  Administered 2016-05-14: 4 mg via INTRAVENOUS
  Filled 2016-05-14: qty 2

## 2016-05-14 MED ORDER — SODIUM CHLORIDE 0.9 % IV SOLN
Freq: Once | INTRAVENOUS | Status: AC
  Administered 2016-05-14: 19:00:00 via INTRAVENOUS

## 2016-05-14 MED ORDER — DIAZEPAM 5 MG PO TABS
5.0000 mg | ORAL_TABLET | Freq: Once | ORAL | Status: AC
Start: 2016-05-14 — End: 2016-05-14
  Administered 2016-05-14: 5 mg via ORAL
  Filled 2016-05-14: qty 1

## 2016-05-14 MED ORDER — IPRATROPIUM-ALBUTEROL 0.5-2.5 (3) MG/3ML IN SOLN
3.0000 mL | Freq: Once | RESPIRATORY_TRACT | Status: AC
  Administered 2016-05-14: 3 mL via RESPIRATORY_TRACT
  Filled 2016-05-14: qty 3

## 2016-05-14 NOTE — ED Notes (Signed)
Patient states her dizziness is about 75% better.

## 2016-05-14 NOTE — ED Notes (Signed)
Ambulated patient to hallway bathroom. Patient was a little unsteady on feet upon standing from sitting. Patient ambulated with 1 person assist. Patient stated that the dizziness was worse with getting up. Patient also stated she felt sick on her stomach after coughing.

## 2016-05-14 NOTE — ED Notes (Signed)
Patient states that her dizziness and spinning sensation might be a little better after medications. Patient laying with eyes closed.

## 2016-05-14 NOTE — H&P (Signed)
History and Physical   [facility] - [place] @ [facility] Admission History and Physical [employer], D.O.    Patient Name: Mckenzie Mclaughlin MR#: [id number] Date of Birth: [birth year] Date of Admission: 05/14/2016  Referring MD/NP/PA: Dr. Jimmye Norman Primary Care Physician: Elsie Stain, MD  Patient coming from: Home  Chief Complaint: Dizziness  HPI: Mckenzie Mclaughlin is a 76 y.o. female with a known history of vertigo, HTN, COPD, CAD was in a usual state of health until this afternoon when she describes worsening dizziness associated with nausea. Patient has a history of vertigo.   Her only other complaint is a chronic cough for the past 2 months which is productive of some yellowish sputum and associated with generalized weakness.   Otherwise there has been no change in status. Patient has been taking medication as prescribed and there has been no recent change in medication or diet.  No recent antibiotics.  There has been no recent illness, hospitalizations, travel or sick contacts.    Patient denies fevers/chills, weakness, dizziness, chest pain, shortness of breath, N/V/C/D, abdominal pain, dysuria/frequency, changes in mental status.   ED Course: Patient received Valium, Antivert, DuoNeb's, Zofran and 1 L normal saline  Review of Systems:  CONSTITUTIONAL: Positive dizziness No fever/chills, fatigue, weakness, weight gain/loss, headache. EYES: No blurry or double vision. ENT: No tinnitus, postnasal drip, redness or soreness of the oropharynx. RESPIRATORY: No cough, dyspnea, wheeze.  No hemoptysis.  CARDIOVASCULAR: No chest pain, palpitations, syncope, orthopnea. No lower extremity edema.  GASTROINTESTINAL: Positive nausea, negative vomiting, abdominal pain, diarrhea, constipation.  No hematemesis, melena or hematochezia. GENITOURINARY: No dysuria, frequency, hematuria. ENDOCRINE: No polyuria or nocturia. No heat or cold intolerance. HEMATOLOGY: No anemia, bruising,  bleeding. INTEGUMENTARY: No rashes, ulcers, lesions. MUSCULOSKELETAL: No arthritis, gout, dyspnea. NEUROLOGIC: No numbness, tingling, ataxia, seizure-type activity, weakness. PSYCHIATRIC: No anxiety, depression, insomnia.   Past Medical History:  Diagnosis Date  . Anginal pain [facility])    see dr [name]  "Spams"  . Anxiety   . Arthritis   . Cancer (HCC)    side of head- skin cancer, squamous cell ca on left foot.  . Complication of anesthesia   . Constipation   . COPD (chronic obstructive pulmonary disease) ([place])   . COPD, severe 07/29/98  . Coronary artery disease 01/22/99   Non Q-wave MI, stent RCA  . Depression   . Depression   . Dysrhythmia    Palpations at times. last time 10/29 ish 2014  . Esophageal dilatation    Pt needs appple sauce to take meds.  . Esophageal stricture    PT needs apple sauce to take meds  . GERD (gastroesophageal reflux disease) 08/29/98   severe-no meds now  . Head injury, acute, with loss of consciousness ([place]) 11/2012   unsure how long  . History of blood transfusion   . History of blurry vision 05/06-05/10/2009   Hospital [facility] CP R/O'D Blurry vision, smoking  . History of CT scan of head 08/02/09   w/o mild age appropr atrophy  . History of ETT 08/03/09   Myoview Normal  . History of ETT 05/1999   Cardiolite pos ETT, neg Cardiolite  . History of ETT 09/02/01   Normal  . History of ETT 04/28/2006   Myoview normal EF 83%  . History of ETT 04/10/11   normal EF and no ischemia per Dr. Saralyn Pilar  . Hx of cardiac catheterization 08/29/01   60% RCA 0/w 20-30% lesions  . Hx of cardiac catheterization 01/22/99  w/Stent 90% RCA lesion  . Hypertension    03/30/00  . Mental disorder   . On home oxygen therapy    as needed-has a travel tank  . Pneumothorax 2/14  . PONV (postoperative nausea and vomiting)    ad breast remocved and see was under anesthesia a long time  . Shortness of breath   . Status post aortic coarctation stent placement      Past Surgical History:  Procedure Laterality Date  . ABDOMINAL HYSTERECTOMY    . BACK SURGERY    . BREAST SURGERY     implant removal, R breast  . CAROTID STENT Right 2000  . CATARACT EXTRACTION Bilateral    2016  . COLONOSCOPY    . ESOPHAGEAL DILATION  06/00   EGD  . LAMINECTOMY  1976   Disc Removal X 2 Lumbar  . MASTECTOMY  03/1976   Bilateral due FCBD with implants [facility])  . OOPHORECTOMY    . TISSUE EXPANDER PLACEMENT Right 02/03/2013   Procedure: REMOVAL OF RIGHT BREAST IMPLANT AND IMPLANT MATERIAL  CAPSULECTOMY;  Surgeon: Irene Limbo, MD;  Location: [place];  Service: Plastics;  Laterality: Right;     reports that she quit smoking about 4 years ago. Her smoking use included Cigarettes. She has a 20.00 pack-year smoking history. She has never used smokeless tobacco. She reports that she does not drink alcohol or use drugs.  Allergies  Allergen Reactions  . Amoxicillin Shortness Of Breath  . Doxycycline Nausea Only and Other (See Comments)    Dizzy Reaction:  Dizziness   . Sertraline Hcl Nausea And Vomiting    REACTION: trembling    Family History  Problem Relation Age of Onset  . Stroke Mother   . Heart disease Mother     MI  . Cancer Father     Lung  . COPD Brother   . Cancer Brother     Lung with mets 2011  . Heart disease Brother     CAD  . Heart disease Sister     cirrhosis due heart disease  . Cirrhosis Sister   . Colon cancer Neg Hx   . Breast cancer Neg Hx    Family history has been reviewed and confirmed with patient.   Prior to Admission medications   Medication Sig Start Date End Date Taking? Authorizing Provider  albuterol (PROAIR HFA) 108 (90 Base) MCG/ACT inhaler INHALE TWO PUFFS BY MOUTH EVERY 6 HOURS AS NEEDED 08/13/15  Yes Tonia Ghent, MD  ALPRAZolam Duanne Moron) 0.5 MG tablet Take 1 tablet (0.5 mg total) by mouth 3 (three) times daily as needed. 02/11/16  Yes Tonia Ghent, MD  aspirin EC 81 MG tablet Take 81 mg by mouth daily.    Yes Historical Provider, MD  atorvastatin (LIPITOR) 20 MG tablet TAKE 1 TABLET BY MOUTH ONCE A DAY 12/10/15  Yes Tonia Ghent, MD  budesonide-formoterol [facility]) 160-4.5 MCG/ACT inhaler Inhale 2 puffs into the lungs 2 (two) times daily. 06/18/14  Yes Tonia Ghent, MD  clopidogrel (PLAVIX) 75 MG tablet TAKE 1 TABLET BY MOUTH ONCE A DAY 12/10/15  Yes Tonia Ghent, MD  furosemide (LASIX) 20 MG tablet Take 20 mg by mouth daily.   Yes Historical Provider, MD  metoprolol succinate (TOPROL-XL) 50 MG 24 hr tablet Take 1 tablet by mouth daily. 05/04/16  Yes Historical Provider, MD  nitroGLYCERIN (NITROSTAT) 0.4 MG SL tablet Place 1 tablet (0.4 mg total) under the tongue every 5 (five) minutes as  needed for chest pain (max 3 doses in 15 min). 12/29/13  Yes Tonia Ghent, MD  polyethylene glycol powder [facility]) powder Take by mouth. 06/20/14  Yes Historical Provider, MD  tiotropium (SPIRIVA HANDIHALER) 18 MCG inhalation capsule INHALE ONE DOSE ONCE DAILY 07/04/14  Yes Historical Provider, MD  venlafaxine [facility]) 37.5 MG tablet 2 tabs in the AM, 1 tab in the PM. 12/19/15  Yes Tonia Ghent, MD    Physical Exam: Vitals:   05/14/16 2104 05/14/16 2205 05/14/16 2236 05/14/16 2310  BP: 119/62 (!) 157/83 (!) 162/83 (!) 146/66  Pulse: 70 70 68 66  Resp:   17 17  Temp:      TempSrc:      SpO2: 100% 99% 99% 98%  Weight:      Height:        GENERAL: 76 y.o.-year-old white female patient, Side lying in no acute distress. HEENT: Head atraumatic, normocephalic. Pupils equal, round, reactive to light and accommodation. No scleral icterus. Extraocular muscles intact. Nares are patent. Oropharynx is clear. Mucus membranes moist. NECK: Supple, full range of motion. No JVD, no bruit heard. No thyroid enlargement, no tenderness, no cervical lymphadenopathy. CHEST: Diffuse rhonchi No use of accessory muscles of respiration.  No reproducible chest wall tenderness.  CARDIOVASCULAR: S1, S2 normal. No  murmurs, rubs, or gallops. Cap refill <2 seconds. Pulses intact distally.  ABDOMEN: Soft, nondistended, nontender. No rebound, guarding, rigidity. Normoactive bowel sounds present in all four quadrants. No organomegaly or mass. EXTREMITIES: No pedal edema, cyanosis, or clubbing. No calf tenderness or Homan's sign.  NEUROLOGIC: The patient is alert and oriented x 3. Cranial nerves II through XII are grossly intact with no focal sensorimotor deficit. Muscle strength 5/5 in all extremities. Sensation intact. Gait not checked. SKIN: Warm, dry, and intact with multiple areas of ecchymoses over bilateral upper and lower extremities    Labs on Admission:  CBC:  Recent Labs Lab 05/14/16 1754  WBC 10.5  NEUTROABS 7.2*  HGB 13.3  HCT 37.8  MCV 87.8  PLT [id number]   Basic Metabolic Panel:  Recent Labs Lab 05/14/16 1754  NA 140  K 3.0*  CL 99*  CO2 34*  GLUCOSE 114*  BUN 19  CREATININE 0.84  CALCIUM 9.3   GFR: Estimated Creatinine Clearance: 58 mL/min (by C-G formula based on SCr of 0.84 mg/dL). Liver Function Tests:  Recent Labs Lab 05/14/16 1754  AST 26  ALT 20  ALKPHOS 72  BILITOT 0.6  PROT 6.4*  ALBUMIN 3.4*   No results for input(s): LIPASE, AMYLASE in the last 168 hours. No results for input(s): AMMONIA in the last 168 hours. Coagulation Profile: No results for input(s): INR, PROTIME in the last 168 hours. Cardiac Enzymes:  Recent Labs Lab 05/14/16 1754  TROPONINI <0.03   BNP (last 3 results) No results for input(s): PROBNP in the last 8760 hours. HbA1C: No results for input(s): HGBA1C in the last 72 hours. CBG: No results for input(s): GLUCAP in the last 168 hours. Lipid Profile: No results for input(s): CHOL, HDL, LDLCALC, TRIG, CHOLHDL, LDLDIRECT in the last 72 hours. Thyroid Function Tests: No results for input(s): TSH, T4TOTAL, FREET4, T3FREE, THYROIDAB in the last 72 hours. Anemia Panel: No results for input(s): VITAMINB12, FOLATE, FERRITIN, TIBC,  IRON, RETICCTPCT in the last 72 hours.  Sepsis Labs: @LABRCNTIP (procalcitonin:4,lacticidven:4) )No results found for this or any previous visit (from the past 240 hour(s)).   Radiological Exams on Admission: Dg Chest 2 View  Result Date: 05/14/2016  CLINICAL DATA:  76 y/o F; weakness and shortness of breath, and wheezing. EXAM: CHEST  2 VIEW COMPARISON:  02/03/2016 chest radiograph. FINDINGS: Stable cardiac silhouette within normal limits given projection and technique. Aortic atherosclerosis with calcification. Emphysema with upper lobe predominance. Bronchitic changes in the lung bases. No focal consolidation. No pleural effusion. No pneumothorax. Demineralized bones. No acute osseous abnormality is identified. IMPRESSION: Emphysema. Aortic atherosclerosis. Lung base bronchitic changes. No focal consolidation. Electronically Signed   By: Kristine Garbe M.D.   On: 05/14/2016 22:05   Ct Head Wo Contrast  Result Date: 05/14/2016 CLINICAL DATA:  Dizziness and nausea. EXAM: CT HEAD WITHOUT CONTRAST TECHNIQUE: Contiguous axial images were obtained from the base of the skull through the vertex without intravenous contrast. COMPARISON:  08/02/2009 FINDINGS: Brain: There is no evidence of acute cortical infarct, intracranial hemorrhage, mass, midline shift, or extra-axial fluid collection. Mild cerebral atrophy is unchanged. Periventricular white matter hypodensities are nonspecific but compatible with mild chronic small vessel ischemic disease. Vascular: Calcified atherosclerosis at the skullbase. No hyperdense vessel. Skull: No fracture or focal osseous lesion. Sinuses/Orbits: No significant sinus disease. Prior bilateral cataract extraction. Other: None. IMPRESSION: 1. No evidence of acute intracranial abnormality. 2. Mild chronic small vessel ischemic disease. Electronically Signed   By: Logan Bores M.D.   On: 05/14/2016 18:21    EKG: Normal sinus rhythm at 74 bpm with normal axis and  nonspecific ST-T wave changes.   Assessment/Plan Active Problems:   Dizziness    This is a 76 y.o. female with a history of vertigo, HTN, COPD, CAD now being admitted with:  1. Intractable dizziness with a history of vertigo - Admit observation - Meclizine - Antiemetics  2. Hypokalemia - Replace PO - Recheck BMP in AM -Continue adding potassium at discharge  3. History of hypertension -Continue metoprolol, Lasix  4. History of COPD -Continue nebulizers and O2 therapy as needed -Continue Symbicort, Spiriva   5. History of CAD -Continue aspirin, Plavix, nitroglycerin  7. History of depression -Continue Effexor, Xanax  8. History of hyperlipidemia -Continue Lipitor  Admission status: Obs IV Fluids: NS Diet/Nutrition: HH Consults called: None  DVT Px: Lovenox, SCDs and early ambulation. Code Status: Full Code  Disposition Plan: To home in <24 horus   All the records are reviewed and case discussed with ED provider. Management plans discussed with the patient and/or family who express understanding and agree with plan of care.  Mckenzie Mclaughlin D.O. on 05/14/2016 at 11:49 PM Between 7am to 6pm - Pager - [phone] After 6pm go to [email or web address] - [employer] [facility] [place] [facility] Office [phone] CC: Primary care physician; Elsie Stain, MD   05/14/2016, 11:49 PM

## 2016-05-14 NOTE — ED Provider Notes (Signed)
[facility] Emergency Department Provider Note        Time seen: ----------------------------------------- 5:45 PM on 05/14/2016 -----------------------------------------    I have reviewed the triage vital signs and the nursing notes.   HISTORY  Chief Complaint Dizziness    HPI Mckenzie Mclaughlin is a 76 y.o. female who presents to the ER being brought from home with reports of dizziness and nausea.Patient reports room spinning sensation onset today. She had nausea as well. She describes a persistent cough for the last 2 months. She has been on prednisone which she states is making her shortness of breath worse. She denies fevers, chills, chest pain, vomiting or diarrhea.   Past Medical History:  Diagnosis Date  . Anginal pain [facility])    see dr [name]  "Spams"  . Anxiety   . Arthritis   . Cancer (HCC)    side of head- skin cancer, squamous cell ca on left foot.  . Complication of anesthesia   . Constipation   . COPD (chronic obstructive pulmonary disease) ([place])   . COPD, severe 07/29/98  . Coronary artery disease 01/22/99   Non Q-wave MI, stent RCA  . Depression   . Depression   . Dysrhythmia    Palpations at times. last time 10/29 ish 2014  . Esophageal dilatation    Pt needs appple sauce to take meds.  . Esophageal stricture    PT needs apple sauce to take meds  . GERD (gastroesophageal reflux disease) 08/29/98   severe-no meds now  . Head injury, acute, with loss of consciousness ([place]) 11/2012   unsure how long  . History of blood transfusion   . History of blurry vision 05/06-05/10/2009   Hospital [facility] CP R/O'D Blurry vision, smoking  . History of CT scan of head 08/02/09   w/o mild age appropr atrophy  . History of ETT 08/03/09   Myoview Normal  . History of ETT 05/1999   Cardiolite pos ETT, neg Cardiolite  . History of ETT 09/02/01   Normal  . History of ETT 04/28/2006   Myoview normal EF 83%  . History of ETT 04/10/11   normal EF and no ischemia per Dr. Saralyn Pilar  . Hx of cardiac catheterization 08/29/01   60% RCA 0/w 20-30% lesions  . Hx of cardiac catheterization 01/22/99   w/Stent 90% RCA lesion  . Hypertension    03/30/00  . Mental disorder   . On home oxygen therapy    as needed-has a travel tank  . Pneumothorax 2/14  . PONV (postoperative nausea and vomiting)    ad breast remocved and see was under anesthesia a long time  . Shortness of breath   . Status post aortic coarctation stent placement     Patient Active Problem List   Diagnosis Date Noted  . Atrophic vaginitis 02/28/2015  . Gross hematuria 02/26/2015  . Radicular pain in right arm 11/21/2014  . Pupil asymmetry 11/16/2014  . Osteoporosis 11/16/2014  . Senile purpura ([place]) 11/16/2014  . Right hand pain 11/16/2014  . Advance care planning 08/15/2014  . Other specified counseling 08/15/2014  . Constipation 06/21/2014  . NSTEMI (non-ST elevated myocardial infarction) ([place]) 12/17/2013  . Acute subendocardial infarction ([place]) 12/17/2013  . History of cardiac catheterization 09/15/2013  . Chest pain on exertion 08/25/2013  . Medicare annual wellness visit, subsequent 07/09/2013  . Beat, premature ventricular 06/26/2013  . Awareness of heartbeats 06/26/2013  . Arteriosclerosis of coronary artery 01/05/2013  . Pulmonary emphysema ([place])  01/05/2013  . Acid reflux 01/05/2013  . H/O pneumothorax 01/05/2013  . Neck pain 08/31/2012  . Anxiety and depression 07/11/2012  . Dysthymia 07/11/2012  . Esophageal dilatation   . Esophageal stricture   . Nutritional marasmus ([place]) 06/22/2012  . COPD with acute exacerbation ([place]) 06/17/2012  . Chronic obstructive pulmonary disease with acute exacerbation ([place]) 06/17/2012  . Lung nodule 04/17/2012  . Edema of foot 04/01/2012  . Paresthesia 10/27/2011  . Atypical chest pain 05/13/2011  . HLD (hyperlipidemia) 02/26/2011  . History of mammogram 02/26/2011  . Vertigo 02/18/2011  . B12  deficiency 01/31/2009  . HEADACHE 10/22/2006  . PREMATURE VENTRICULAR CONTRACTIONS, FREQUENT 07/19/2006  . HYPERTENSION 03/30/2000  . Essential (primary) hypertension 03/30/2000  . CORONARY ARTERY DISEASE 01/22/1999  . Atherosclerosis of coronary artery 01/22/1999  . GERD 08/29/1998  . Chronic obstructive pulmonary disease ([place]) 07/29/1998    Past Surgical History:  Procedure Laterality Date  . ABDOMINAL HYSTERECTOMY    . BACK SURGERY    . BREAST SURGERY     implant removal, R breast  . CAROTID STENT Right 2000  . CATARACT EXTRACTION Bilateral    2016  . COLONOSCOPY    . ESOPHAGEAL DILATION  06/00   EGD  . LAMINECTOMY  1976   Disc Removal X 2 Lumbar  . MASTECTOMY  03/1976   Bilateral due FCBD with implants [facility])  . OOPHORECTOMY    . TISSUE EXPANDER PLACEMENT Right 02/03/2013   Procedure: REMOVAL OF RIGHT BREAST IMPLANT AND IMPLANT MATERIAL  CAPSULECTOMY;  Surgeon: Irene Limbo, MD;  Location: [place];  Service: Plastics;  Laterality: Right;    Allergies Amoxicillin; Doxycycline; and Sertraline hcl  Social History Social History  Substance Use Topics  . Smoking status: Former Smoker    Packs/day: 0.40    Years: 50.00    Types: Cigarettes    Quit date: 02/28/2012  . Smokeless tobacco: Never Used     Comment: 1/4 PPD as of 2012  . Alcohol use No    Review of Systems Constitutional: Negative for fever. Cardiovascular: Negative for chest pain. Respiratory: Positive for shortness of breath, cough Gastrointestinal: Negative for abdominal pain, positive for nausea and vomiting Genitourinary: Negative for dysuria. Musculoskeletal: Negative for back pain. Skin: Negative for rash. Neurological: Negative for headaches, focal weakness or numbness. Positive for vertigo  10-point ROS otherwise negative.  ____________________________________________   PHYSICAL EXAM:  VITAL SIGNS: ED Triage Vitals  Enc Vitals Group     BP 05/14/16 1737 (!) 154/58     Pulse Rate  05/14/16 1737 72     Resp 05/14/16 1737 18     Temp 05/14/16 1737 98 F (36.7 C)     Temp Source 05/14/16 1737 Oral     SpO2 05/14/16 1737 99 %     Weight 05/14/16 1738 155 lb (70.3 kg)     Height 05/14/16 1738 5\' 7"  (1.702 m)     Head Circumference --      Peak Flow --      Pain Score --      Pain Loc --      Pain Edu? --      Excl. in [place]? --    Constitutional: Alert and oriented. Mild distress Eyes: Conjunctivae are normal. PERRL. Normal extraocular movements. No nystagmus ENT   Head: Normocephalic and atraumatic.   Nose: No congestion/rhinnorhea.   Mouth/Throat: Mucous membranes are moist.   Neck: No stridor. Cardiovascular: Normal rate, regular rhythm. No murmurs, rubs, or gallops. Respiratory:  Normal respiratory effort with rhonchi bilaterally Gastrointestinal: Soft and nontender. Normal bowel sounds Musculoskeletal: Nontender with normal range of motion in all extremities. No lower extremity tenderness nor edema. Neurologic:  Normal speech and language. No gross focal neurologic deficits are appreciated.  Skin:  Skin is warm, dry and intact. No rash noted. ____________________________________________  EKG: Interpreted by me Sinus rhythm with a sinus arrhythmia, rate is 74 bpm, normal PR interval, normal QRS size, possible septal infarct age indeterminate  ____________________________________________  ED COURSE:  Pertinent labs & imaging results that were available during my care of the patient were reviewed by me and considered in my medical decision making (see chart for details). Patient presents to the ER for vertigo. We will assess with labs and imaging. She will receive fluids, Valium and meclizine. Clinical Course as of May 14 2152  Thu May 14, 2016  1950 Patient is reporting some improvement  [JW]    Clinical Course User Index [JW] Earleen Newport, MD   Procedures ____________________________________________   LABS (pertinent  positives/negatives)  Labs Reviewed  CBC WITH DIFFERENTIAL/PLATELET - Abnormal; Notable for the following:       Result Value   Neutro Abs 7.2 (*)    All other components within normal limits  COMPREHENSIVE METABOLIC PANEL - Abnormal; Notable for the following:    Potassium 3.0 (*)    Chloride 99 (*)    CO2 34 (*)    Glucose, Bld 114 (*)    Total Protein 6.4 (*)    Albumin 3.4 (*)    All other components within normal limits  URINALYSIS, COMPLETE (UACMP) WITH MICROSCOPIC - Abnormal; Notable for the following:    Color, Urine YELLOW (*)    APPearance CLEAR (*)    Hgb urine dipstick SMALL (*)    Leukocytes, UA TRACE (*)    All other components within normal limits  TROPONIN I    RADIOLOGY Images were viewed by me  CT head IMPRESSION: 1. No evidence of acute intracranial abnormality. 2. Mild chronic small vessel ischemic disease. ____________________________________________  FINAL ASSESSMENT AND PLAN  Vertigo  Plan: Patient with labs and imaging as dictated above. Symptoms are essentially unchanged now with persistent nausea and dizziness. Her labs are grossly reassuring as well as her head CT. She has received a liter of fluids as well as meclizine, Valium and Zofran twice. I will discuss with the hospitalist for admission.   Earleen Newport, MD   Note: This note was generated in part or whole with voice recognition software. Voice recognition is usually quite accurate but there are transcription errors that can and very often do occur. I apologize for any typographical errors that were not detected and corrected.     Earleen Newport, MD 05/14/16 2156

## 2016-05-14 NOTE — ED Notes (Signed)
Patient transported to CT 

## 2016-05-14 NOTE — ED Triage Notes (Signed)
Pt arrived via EMS from home for reports of dizziness and nausea. EMS reports NSR, 152/82, 75HR, 95% 2L, CBG 83.

## 2016-05-15 ENCOUNTER — Encounter
Admission: RE | Admit: 2016-05-15 | Discharge: 2016-05-15 | Disposition: A | Discharge status: Home / Self Care | Payer: Medicare Other | Source: Ambulatory Visit | Attending: Internal Medicine | Admitting: Internal Medicine

## 2016-05-15 DIAGNOSIS — Z881 Allergy status to other antibiotic agents status: Secondary | ICD-10-CM | POA: Diagnosis not present

## 2016-05-15 DIAGNOSIS — Z7902 Long term (current) use of antithrombotics/antiplatelets: Secondary | ICD-10-CM | POA: Diagnosis not present

## 2016-05-15 DIAGNOSIS — F418 Other specified anxiety disorders: Secondary | ICD-10-CM | POA: Diagnosis not present

## 2016-05-15 DIAGNOSIS — Z955 Presence of coronary angioplasty implant and graft: Secondary | ICD-10-CM | POA: Diagnosis not present

## 2016-05-15 DIAGNOSIS — I10 Essential (primary) hypertension: Secondary | ICD-10-CM | POA: Diagnosis not present

## 2016-05-15 DIAGNOSIS — Z888 Allergy status to other drugs, medicaments and biological substances status: Secondary | ICD-10-CM | POA: Diagnosis not present

## 2016-05-15 DIAGNOSIS — J410 Simple chronic bronchitis: Secondary | ICD-10-CM | POA: Diagnosis not present

## 2016-05-15 DIAGNOSIS — I252 Old myocardial infarction: Secondary | ICD-10-CM | POA: Diagnosis not present

## 2016-05-15 DIAGNOSIS — I509 Heart failure, unspecified: Secondary | ICD-10-CM | POA: Diagnosis not present

## 2016-05-15 DIAGNOSIS — K222 Esophageal obstruction: Secondary | ICD-10-CM | POA: Diagnosis not present

## 2016-05-15 DIAGNOSIS — J9611 Chronic respiratory failure with hypoxia: Secondary | ICD-10-CM | POA: Diagnosis not present

## 2016-05-15 DIAGNOSIS — I25119 Atherosclerotic heart disease of native coronary artery with unspecified angina pectoris: Secondary | ICD-10-CM | POA: Diagnosis not present

## 2016-05-15 DIAGNOSIS — D692 Other nonthrombocytopenic purpura: Secondary | ICD-10-CM | POA: Diagnosis not present

## 2016-05-15 DIAGNOSIS — E41 Nutritional marasmus: Secondary | ICD-10-CM | POA: Diagnosis not present

## 2016-05-15 DIAGNOSIS — R2689 Other abnormalities of gait and mobility: Secondary | ICD-10-CM | POA: Diagnosis not present

## 2016-05-15 DIAGNOSIS — J439 Emphysema, unspecified: Secondary | ICD-10-CM | POA: Diagnosis not present

## 2016-05-15 DIAGNOSIS — G4733 Obstructive sleep apnea (adult) (pediatric): Secondary | ICD-10-CM | POA: Diagnosis not present

## 2016-05-15 DIAGNOSIS — K219 Gastro-esophageal reflux disease without esophagitis: Secondary | ICD-10-CM | POA: Diagnosis not present

## 2016-05-15 DIAGNOSIS — R42 Dizziness and giddiness: Secondary | ICD-10-CM | POA: Diagnosis not present

## 2016-05-15 DIAGNOSIS — K5900 Constipation, unspecified: Secondary | ICD-10-CM | POA: Diagnosis not present

## 2016-05-15 DIAGNOSIS — Z7982 Long term (current) use of aspirin: Secondary | ICD-10-CM | POA: Diagnosis not present

## 2016-05-15 DIAGNOSIS — I2510 Atherosclerotic heart disease of native coronary artery without angina pectoris: Secondary | ICD-10-CM | POA: Diagnosis not present

## 2016-05-15 DIAGNOSIS — J449 Chronic obstructive pulmonary disease, unspecified: Secondary | ICD-10-CM | POA: Diagnosis not present

## 2016-05-15 DIAGNOSIS — I700 Atherosclerosis of aorta: Secondary | ICD-10-CM | POA: Diagnosis not present

## 2016-05-15 DIAGNOSIS — R531 Weakness: Secondary | ICD-10-CM | POA: Diagnosis not present

## 2016-05-15 DIAGNOSIS — E538 Deficiency of other specified B group vitamins: Secondary | ICD-10-CM | POA: Diagnosis not present

## 2016-05-15 DIAGNOSIS — I493 Ventricular premature depolarization: Secondary | ICD-10-CM | POA: Diagnosis not present

## 2016-05-15 DIAGNOSIS — Z9981 Dependence on supplemental oxygen: Secondary | ICD-10-CM | POA: Diagnosis not present

## 2016-05-15 DIAGNOSIS — E785 Hyperlipidemia, unspecified: Secondary | ICD-10-CM | POA: Diagnosis not present

## 2016-05-15 DIAGNOSIS — N952 Postmenopausal atrophic vaginitis: Secondary | ICD-10-CM | POA: Diagnosis not present

## 2016-05-15 DIAGNOSIS — Z85828 Personal history of other malignant neoplasm of skin: Secondary | ICD-10-CM | POA: Diagnosis not present

## 2016-05-15 DIAGNOSIS — E876 Hypokalemia: Secondary | ICD-10-CM | POA: Diagnosis not present

## 2016-05-15 LAB — PHOSPHORUS: Phosphorus: 2.8 mg/dL (ref 2.5–4.6)

## 2016-05-15 LAB — CBC
HCT: 38.4 % (ref 35.0–47.0)
HEMOGLOBIN: 12.7 g/dL (ref 12.0–16.0)
MCH: 29.7 pg (ref 26.0–34.0)
MCHC: 33.1 g/dL (ref 32.0–36.0)
MCV: 89.8 fL (ref 80.0–100.0)
PLATELETS: 196 10*3/uL (ref 150–440)
RBC: 4.27 MIL/uL (ref 3.80–5.20)
RDW: 13.7 % (ref 11.5–14.5)
WBC: 7.5 10*3/uL (ref 3.6–11.0)

## 2016-05-15 LAB — BASIC METABOLIC PANEL
ANION GAP: 6 (ref 5–15)
BUN: 15 mg/dL (ref 6–20)
CALCIUM: 8.6 mg/dL — AB (ref 8.9–10.3)
CO2: 31 mmol/L (ref 22–32)
CREATININE: 0.82 mg/dL (ref 0.44–1.00)
Chloride: 106 mmol/L (ref 101–111)
GFR calc non Af Amer: >60 mL/min (ref >60)
Glucose, Bld: 85 mg/dL (ref 65–99)
Potassium: 3.8 mmol/L (ref 3.5–5.1)
SODIUM: 143 mmol/L (ref 135–145)

## 2016-05-15 LAB — MAGNESIUM: MAGNESIUM: 1.8 mg/dL (ref 1.7–2.4)

## 2016-05-15 MED ORDER — ONDANSETRON HCL 4 MG PO TABS: 4.0000 mg | ORAL_TABLET | Freq: Four times a day (QID) | ORAL | Status: DC | PRN

## 2016-05-15 MED ORDER — MAGNESIUM CITRATE PO SOLN: 1.0000 | Freq: Once | ORAL | Status: DC | PRN

## 2016-05-15 MED ORDER — TIOTROPIUM BROMIDE MONOHYDRATE 18 MCG IN CAPS
18.0000 ug | ORAL_CAPSULE | Freq: Every day | RESPIRATORY_TRACT | Status: DC
  Administered 2016-05-15: 18 ug via RESPIRATORY_TRACT
  Filled 2016-05-15: qty 5

## 2016-05-15 MED ORDER — VENLAFAXINE HCL 75 MG PO TABS
75.0000 mg | ORAL_TABLET | Freq: Every day | ORAL | Status: DC
  Administered 2016-05-15: 75 mg via ORAL
  Filled 2016-05-15: qty 1

## 2016-05-15 MED ORDER — ONDANSETRON HCL 4 MG/2ML IJ SOLN: 4.0000 mg | Freq: Four times a day (QID) | INTRAMUSCULAR | Status: DC | PRN

## 2016-05-15 MED ORDER — BISACODYL 5 MG PO TBEC: 5.0000 mg | DELAYED_RELEASE_TABLET | Freq: Every day | ORAL | Status: DC | PRN

## 2016-05-15 MED ORDER — VENLAFAXINE HCL 37.5 MG PO TABS: 37.5000 mg | ORAL_TABLET | Freq: Two times a day (BID) | ORAL | Status: DC

## 2016-05-15 MED ORDER — IPRATROPIUM BROMIDE 0.02 % IN SOLN: 0.5000 mg | Freq: Four times a day (QID) | RESPIRATORY_TRACT | Status: DC | PRN

## 2016-05-15 MED ORDER — ACETAMINOPHEN 650 MG RE SUPP
650.0000 mg | Freq: Four times a day (QID) | RECTAL | Status: DC | PRN
Start: 2016-05-15 — End: 2016-05-15

## 2016-05-15 MED ORDER — ALPRAZOLAM 0.5 MG PO TABS
0.5000 mg | ORAL_TABLET | Freq: Three times a day (TID) | ORAL | Status: DC | PRN
  Administered 2016-05-15 (×2): 0.5 mg via ORAL
  Filled 2016-05-15 (×2): qty 1

## 2016-05-15 MED ORDER — ENOXAPARIN SODIUM 40 MG/0.4ML ~~LOC~~ SOLN
40.0000 mg | Freq: Every day | SUBCUTANEOUS | Status: DC
  Administered 2016-05-15: 01:00:00 40 mg via SUBCUTANEOUS
  Filled 2016-05-15: qty 0.4

## 2016-05-15 MED ORDER — OXYCODONE HCL 5 MG PO TABS: 5.0000 mg | ORAL_TABLET | ORAL | Status: DC | PRN

## 2016-05-15 MED ORDER — ALBUTEROL SULFATE (2.5 MG/3ML) 0.083% IN NEBU: 2.5000 mg | INHALATION_SOLUTION | Freq: Four times a day (QID) | RESPIRATORY_TRACT | Status: DC | PRN

## 2016-05-15 MED ORDER — MECLIZINE HCL 25 MG PO TABS: 25.0000 mg | ORAL_TABLET | Freq: Three times a day (TID) | ORAL | 0 refills | Status: DC | PRN

## 2016-05-15 MED ORDER — SODIUM CHLORIDE 0.9 % IV SOLN
INTRAVENOUS | Status: DC
  Administered 2016-05-15: 01:00:00 via INTRAVENOUS

## 2016-05-15 MED ORDER — VENLAFAXINE HCL 37.5 MG PO TABS
37.5000 mg | ORAL_TABLET | Freq: Every day | ORAL | Status: DC
  Filled 2016-05-15: qty 1

## 2016-05-15 MED ORDER — CYANOCOBALAMIN 1000 MCG/ML IJ SOLN: 1000.0000 ug | INTRAMUSCULAR | Status: DC

## 2016-05-15 MED ORDER — POTASSIUM CHLORIDE CRYS ER 20 MEQ PO TBCR
40.0000 meq | EXTENDED_RELEASE_TABLET | Freq: Two times a day (BID) | ORAL | Status: AC
  Administered 2016-05-15 (×2): 40 meq via ORAL
  Filled 2016-05-15 (×2): qty 2

## 2016-05-15 MED ORDER — METOPROLOL SUCCINATE ER 50 MG PO TB24
50.0000 mg | ORAL_TABLET | Freq: Every day | ORAL | Status: DC
  Administered 2016-05-15: 50 mg via ORAL
  Filled 2016-05-15: qty 1

## 2016-05-15 MED ORDER — CLOPIDOGREL BISULFATE 75 MG PO TABS
75.0000 mg | ORAL_TABLET | Freq: Every day | ORAL | Status: DC
  Administered 2016-05-15: 75 mg via ORAL
  Filled 2016-05-15: qty 1

## 2016-05-15 MED ORDER — FUROSEMIDE 20 MG PO TABS
20.0000 mg | ORAL_TABLET | Freq: Every day | ORAL | Status: DC
  Administered 2016-05-15: 20 mg via ORAL
  Filled 2016-05-15: qty 1

## 2016-05-15 MED ORDER — ACETAMINOPHEN 325 MG PO TABS: 650.0000 mg | ORAL_TABLET | Freq: Four times a day (QID) | ORAL | Status: DC | PRN

## 2016-05-15 MED ORDER — ASPIRIN EC 81 MG PO TBEC
81.0000 mg | DELAYED_RELEASE_TABLET | Freq: Every day | ORAL | Status: DC
  Administered 2016-05-15: 10:00:00 81 mg via ORAL
  Filled 2016-05-15: qty 1

## 2016-05-15 MED ORDER — MECLIZINE HCL 25 MG PO TABS: 50.0000 mg | ORAL_TABLET | Freq: Three times a day (TID) | ORAL | Status: DC | PRN

## 2016-05-15 MED ORDER — ATORVASTATIN CALCIUM 20 MG PO TABS
20.0000 mg | ORAL_TABLET | Freq: Every day | ORAL | Status: DC
  Administered 2016-05-15: 20 mg via ORAL
  Filled 2016-05-15: qty 1

## 2016-05-15 MED ORDER — SENNOSIDES-DOCUSATE SODIUM 8.6-50 MG PO TABS: 1.0000 | ORAL_TABLET | Freq: Every evening | ORAL | Status: DC | PRN

## 2016-05-15 MED ORDER — MOMETASONE FURO-FORMOTEROL FUM 200-5 MCG/ACT IN AERO
2.0000 | INHALATION_SPRAY | Freq: Two times a day (BID) | RESPIRATORY_TRACT | Status: DC
  Administered 2016-05-15: 2 via RESPIRATORY_TRACT
  Filled 2016-05-15: qty 8.8

## 2016-05-15 MED ORDER — NITROGLYCERIN 0.4 MG SL SUBL: 0.4000 mg | SUBLINGUAL_TABLET | SUBLINGUAL | Status: DC | PRN

## 2016-05-15 NOTE — Progress Notes (Signed)
Pt transferred to [facility] via car by her family.

## 2016-05-15 NOTE — Clinical Social Work Note (Signed)
Clinical Social Work Assessment  Patient Details  Name: Mckenzie Mclaughlin MRN: [id number] Date of Birth: 01/02/1941  Date of referral:  05/15/16               Reason for consult:  Facility Placement                Permission sought to share information with:  Facility Sport and exercise psychologist, Family Supports Permission granted to share information::  Yes, Verbal Permission Granted  Name::     Handrich,Richard Spouse [phone] or Gae Dry [phone]   Agency::  SNF admissions  Relationship::     Contact Information:     Housing/Transportation Living arrangements for the past 2 months:  Single Family Home Source of Information:  Patient, Spouse Patient Interpreter Needed:  None Criminal Activity/Legal Involvement Pertinent to Current Situation/Hospitalization:  No - Comment as needed Significant Relationships:  Spouse Lives with:  Spouse Do you feel safe going back to the place where you live?  No Need for family participation in patient care:  No (Coment)  Care giving concerns:  Patient feels she needs short term rehab before she is able to return back home.   Social Worker assessment / plan:  Patient is a 76 year old female who is married and lives with her husband.  Patient is alert and oriented x4 and able to express her feelings.  Patient states she has been to [facility] for rehab in the past, and is familiar with the process.  Patient was explained how insurance will pay for stay and what to expect at SNF for rehab.  CSW informed patient what role of CSW is to find placement.  Patient said her husband would be able to transport her, and he will bring in her portable tank.  Patient did not express any other questions or concerns.  CSW was given permission to begin bed search process in [facility].   Employment status:  Retired Nurse, adult PT Recommendations:  [place] / Referral to community resources:   [place]  Patient/Family's Response to care: Patient in agreement to going to SNF for short term rehab.  Patient/Family's Understanding of and Emotional Response to Diagnosis, Current Treatment, and Prognosis:  Patient is aware of current treatment plan and prognosis.  Patient expressed she is hopeful she will not have to be in rehab very long.  Emotional Assessment Appearance:  Appears stated age Attitude/Demeanor/Rapport:    Affect (typically observed):  Appropriate, Calm, Stable Orientation:  Oriented to Self, Oriented to Place, Oriented to  Time, Oriented to Situation Alcohol / Substance use:  Not Applicable Psych involvement (Current and /or in the community):  No (Comment)  Discharge Needs  Concerns to be addressed:  Lack of Support Readmission within the last 30 days:  No Current discharge risk:  Lack of support system Barriers to Discharge:  No Barriers Identified   Ross Ludwig, LCSWA 05/15/2016, 3:58 PM

## 2016-05-15 NOTE — Progress Notes (Signed)
[place] at [place] NAME: Kenyada Blizard    MR#:  [id number]  DATE OF BIRTH:  July 20, 1940  SUBJECTIVE:   Dizziness is improved. She has been evaluated by physical therapy is recommending skilled nursing facility. Patient is very weak. Patient has had a cough for the past 3 months. On neurological deficits.  REVIEW OF SYSTEMS:    Review of Systems  Constitutional: Positive for malaise/fatigue. Negative for chills and fever.  HENT: Negative.  Negative for ear discharge, ear pain, hearing loss, nosebleeds and sore throat.   Eyes: Negative.  Negative for blurred vision and pain.  Respiratory: Positive for cough. Negative for hemoptysis, shortness of breath and wheezing.   Cardiovascular: Negative.  Negative for chest pain, palpitations and leg swelling.  Gastrointestinal: Positive for nausea. Negative for abdominal pain, blood in stool, diarrhea and vomiting.  Genitourinary: Negative.  Negative for dysuria.  Musculoskeletal: Negative.  Negative for back pain.  Skin: Negative.   Neurological: Positive for weakness. Negative for dizziness, tremors, speech change, focal weakness, seizures and headaches.  Endo/Heme/Allergies: Negative.  Does not bruise/bleed easily.  Psychiatric/Behavioral: Negative.  Negative for depression, hallucinations and suicidal ideas.    Tolerating Diet: yes      DRUG ALLERGIES:   Allergies  Allergen Reactions  . Amoxicillin Shortness Of Breath  . Doxycycline Nausea Only and Other (See Comments)    Dizzy Reaction:  Dizziness   . Sertraline Hcl Nausea And Vomiting    REACTION: trembling    VITALS:  Blood pressure (!) 144/61, pulse 86, temperature 98.3 F (36.8 C), resp. rate 20, height 5\' 7"  (1.702 m), weight 71.4 kg (157 lb 4.8 oz), SpO2 100 %.  PHYSICAL EXAMINATION:   Physical Exam    LABORATORY PANEL:   CBC  Recent Labs Lab 05/15/16 0605  WBC 7.5  HGB 12.7  HCT 38.4  PLT 196    ------------------------------------------------------------------------------------------------------------------  Chemistries   Recent Labs Lab 05/14/16 1754 05/15/16 0605  NA 140 143  K 3.0* 3.8  CL 99* 106  CO2 34* 31  GLUCOSE 114* 85  BUN 19 15  CREATININE 0.84 0.82  CALCIUM 9.3 8.6*  MG 1.8  --   AST 26  --   ALT 20  --   ALKPHOS 72  --   BILITOT 0.6  --    ------------------------------------------------------------------------------------------------------------------  Cardiac Enzymes  Recent Labs Lab 05/14/16 1754  TROPONINI <0.03   ------------------------------------------------------------------------------------------------------------------  RADIOLOGY:  Dg Chest 2 View  Result Date: 05/14/2016 CLINICAL DATA:  76 y/o F; weakness and shortness of breath, and wheezing. EXAM: CHEST  2 VIEW COMPARISON:  02/03/2016 chest radiograph. FINDINGS: Stable cardiac silhouette within normal limits given projection and technique. Aortic atherosclerosis with calcification. Emphysema with upper lobe predominance. Bronchitic changes in the lung bases. No focal consolidation. No pleural effusion. No pneumothorax. Demineralized bones. No acute osseous abnormality is identified. IMPRESSION: Emphysema. Aortic atherosclerosis. Lung base bronchitic changes. No focal consolidation. Electronically Signed   By: Kristine Garbe M.D.   On: 05/14/2016 22:05   Ct Head Wo Contrast  Result Date: 05/14/2016 CLINICAL DATA:  Dizziness and nausea. EXAM: CT HEAD WITHOUT CONTRAST TECHNIQUE: Contiguous axial images were obtained from the base of the skull through the vertex without intravenous contrast. COMPARISON:  08/02/2009 FINDINGS: Brain: There is no evidence of acute cortical infarct, intracranial hemorrhage, mass, midline shift, or extra-axial fluid collection. Mild cerebral atrophy is unchanged. Periventricular white matter hypodensities are nonspecific but compatible with mild  chronic small  vessel ischemic disease. Vascular: Calcified atherosclerosis at the skullbase. No hyperdense vessel. Skull: No fracture or focal osseous lesion. Sinuses/Orbits: No significant sinus disease. Prior bilateral cataract extraction. Other: None. IMPRESSION: 1. No evidence of acute intracranial abnormality. 2. Mild chronic small vessel ischemic disease. Electronically Signed   By: Logan Bores M.D.   On: 05/14/2016 18:21     ASSESSMENT AND PLAN:   76 year old female with a history of vertigo and COPD on chronic oxygen who presents with dizziness.  1. Vertigo: This has resolved  2. Generalized weakness due to receiving medications for vertigo and anxiety. Physical therapy is recommending skilled nursing facility  3. Chronic cough: This is due to chronic bronchitis from COPD   4. Hypokalemia: Replete  5. History of hypertension: Continue metoprolol  6. History of COPD without exacerbation Continue inhalers  7. History of CAD: Continue aspirin, Plavix, statin    8 history of depression: Continue Effexor and when necessary Xanax    Management plans discussed with the patient and she  is in agreement.  CODE STATUS: Full  TOTAL TIME TAKING CARE OF THIS PATIENT: 30  minutes.     POSSIBLE D/C today, DEPENDING ON CLINICAL CONDITION.   Dominik Yordy M.D on 05/15/2016 at 12:01 PM  Between 7am to 6pm - Pager - [phone] After 6pm go to [email or web address] - password [email or web address] [place] [facility]  Office  [phone]  CC: Primary care physician; Elsie Stain, MD  Note: This dictation was prepared with Dragon dictation along with smaller phrase technology. Any transcriptional errors that result from this process are unintentional.

## 2016-05-15 NOTE — Clinical Social Work Placement (Signed)
   CLINICAL SOCIAL WORK PLACEMENT  NOTE  Date:  05/15/2016  Patient Details  Name: AKILA HAYASHI MRN: [id number] Date of Birth: [birth year]  Clinical Social Work is seeking post-discharge placement for this patient at the [place] level of care (*CSW will initial, date and re-position this form in  chart as items are completed):  Yes   Patient/family provided with [place] Work Department's list of facilities offering this level of care within the geographic area requested by the patient (or if unable, by the patient's family).  Yes   Patient/family informed of their freedom to choose among providers that offer the needed level of care, that participate in Medicare, Medicaid or managed care program needed by the patient, have an available bed and are willing to accept the patient.  Yes   Patient/family informed of [place]'s ownership interest in [facility] and [facility], as well as of the fact that they are under no obligation to receive care at these facilities.  PASRR submitted to EDS on 05/15/16     PASRR number received on       Existing PASRR number confirmed on 05/15/16     FL2 transmitted to all facilities in geographic area requested by pt/family on 05/15/16     FL2 transmitted to all facilities within larger geographic area on       Patient informed that his/her managed care company has contracts with or will negotiate with certain facilities, including the following:        Yes   Patient/family informed of bed offers received.  Patient chooses bed at [facility]     Physician recommends and patient chooses bed at      Patient to be transferred to [facility] on 05/15/16.  Patient to be transferred to facility by Patient's husband's car     Patient family notified on 05/15/16 of transfer.  Name of family member notified:  Cheron Rodrigues patient's husband.     PHYSICIAN Please sign DNR, Please sign FL2      Additional Comment:    _______________________________________________ Ross Ludwig, LCSWA 05/15/2016, 4:17 PM

## 2016-05-15 NOTE — Care Management Obs Status (Signed)
[place] NOTIFICATION   Patient Details  Name: Mckenzie Mclaughlin MRN: [id number] Date of Birth: 15-Oct-1940   Medicare Observation Status Notification Given:  No (Admitted observation less than 24 hours)    Beverly Sessions, RN 05/15/2016, 2:22 PM

## 2016-05-15 NOTE — NC FL2 (Signed)
[place] LEVEL OF CARE SCREENING TOOL     IDENTIFICATION  Patient Name: Mckenzie Mclaughlin Birthdate: [birth year] Sex: female Admission Date (Current Location): 05/14/2016  [place] and [place] Number:  Engineering geologist and Address:  [facility], [address], [place], [place] [address]      Provider Number: [id number]  Attending Physician Name and Address:  Bettey Costa, MD  Relative Name and Phone Number:  Mishra,Richard Spouse [phone] and Gae Dry [id number]     Current Level of Care: Hospital Recommended Level of Care: [place] Prior Approval Number:    Date Approved/Denied:   PASRR Number: [id number]  Discharge Plan: SNF    Current Diagnoses: Patient Active Problem List   Diagnosis Date Noted  . Dizziness 05/14/2016  . Atrophic vaginitis 02/28/2015  . Gross hematuria 02/26/2015  . Radicular pain in right arm 11/21/2014  . Pupil asymmetry 11/16/2014  . Osteoporosis 11/16/2014  . Senile purpura ([place]) 11/16/2014  . Right hand pain 11/16/2014  . Advance care planning 08/15/2014  . Other specified counseling 08/15/2014  . Constipation 06/21/2014  . NSTEMI (non-ST elevated myocardial infarction) ([place]) 12/17/2013  . Acute subendocardial infarction ([place]) 12/17/2013  . History of cardiac catheterization 09/15/2013  . Chest pain on exertion 08/25/2013  . Medicare annual wellness visit, subsequent 07/09/2013  . Beat, premature ventricular 06/26/2013  . Awareness of heartbeats 06/26/2013  . Arteriosclerosis of coronary artery 01/05/2013  . Pulmonary emphysema ([place]) 01/05/2013  . Acid reflux 01/05/2013  . H/O pneumothorax 01/05/2013  . Neck pain 08/31/2012  . Anxiety and depression 07/11/2012  . Dysthymia 07/11/2012  . Esophageal dilatation   . Esophageal stricture   . Nutritional marasmus ([place]) 06/22/2012  . COPD with acute exacerbation ([place]) 06/17/2012  . Chronic obstructive pulmonary  disease with acute exacerbation ([place]) 06/17/2012  . Lung nodule 04/17/2012  . Edema of foot 04/01/2012  . Paresthesia 10/27/2011  . Atypical chest pain 05/13/2011  . HLD (hyperlipidemia) 02/26/2011  . History of mammogram 02/26/2011  . Vertigo 02/18/2011  . B12 deficiency 01/31/2009  . HEADACHE 10/22/2006  . PREMATURE VENTRICULAR CONTRACTIONS, FREQUENT 07/19/2006  . HYPERTENSION 03/30/2000  . Essential (primary) hypertension 03/30/2000  . CORONARY ARTERY DISEASE 01/22/1999  . Atherosclerosis of coronary artery 01/22/1999  . GERD 08/29/1998  . Chronic obstructive pulmonary disease ([place]) 07/29/1998    Orientation RESPIRATION BLADDER Height & Weight     Self, Time, Situation, Place  O2 (2L of oxygen) Continent Weight: 157 lb 4.8 oz (71.4 kg) Height:  5\' 7"  (170.2 cm)  BEHAVIORAL SYMPTOMS/MOOD NEUROLOGICAL BOWEL NUTRITION STATUS      Continent Diet (Cardiac)  AMBULATORY STATUS COMMUNICATION OF NEEDS Skin   Limited Assist Verbally Normal                       Personal Care Assistance Level of Assistance  Bathing, Feeding, Dressing Bathing Assistance: Limited assistance Feeding assistance: Independent Dressing Assistance: Limited assistance     Functional Limitations Info  Sight, Hearing, Speech Sight Info: Adequate Hearing Info: Adequate Speech Info: Adequate    SPECIAL CARE FACTORS FREQUENCY  PT (By licensed PT)     PT Frequency: 5x a week              Contractures Contractures Info: Not present    Additional Factors Info  Code Status, Allergies, Psychotropic Code Status Info: Full Code Allergies Info: AMOXICILLIN, DOXYCYCLINE, SERTRALINE HCL  Psychotropic Info: venlafaxine (EFFEXOR) tablet 75 mg and venlafaxine (  EFFEXOR) tablet 37.5 mg         Current Medications (05/15/2016):  This is the current hospital active medication list Current Facility-Administered Medications  Medication Dose Route Frequency Provider Last Rate Last Dose  . acetaminophen  (TYLENOL) tablet 650 mg  650 mg Oral Q6H PRN Alexis Hugelmeyer, DO       Or  . acetaminophen (TYLENOL) suppository 650 mg  650 mg Rectal Q6H PRN Alexis Hugelmeyer, DO      . albuterol (PROVENTIL) (2.5 MG/3ML) 0.083% nebulizer solution 2.5 mg  2.5 mg Nebulization Q6H PRN Alexis Hugelmeyer, DO      . ALPRAZolam Duanne Moron) tablet 0.5 mg  0.5 mg Oral TID PRN Alexis Hugelmeyer, DO   0.5 mg at 05/15/16 [id number]  . aspirin EC tablet 81 mg  81 mg Oral Daily Alexis Hugelmeyer, DO   81 mg at 05/15/16 0942  . atorvastatin (LIPITOR) tablet 20 mg  20 mg Oral Daily Alexis Hugelmeyer, DO   20 mg at 05/15/16 0942  . bisacodyl (DULCOLAX) EC tablet 5 mg  5 mg Oral Daily PRN Alexis Hugelmeyer, DO      . clopidogrel (PLAVIX) tablet 75 mg  75 mg Oral Daily Alexis Hugelmeyer, DO   75 mg at 05/15/16 0942  . [START ON 05/30/2016] cyanocobalamin ((VITAMIN B-12)) injection 1,000 mcg  1,000 mcg Intramuscular Q30 days [employer], DO      . enoxaparin (LOVENOX) injection 40 mg  40 mg Subcutaneous QHS Alexis Hugelmeyer, DO   40 mg at 05/15/16 0126  . furosemide (LASIX) tablet 20 mg  20 mg Oral Daily Alexis Hugelmeyer, DO   20 mg at 05/15/16 0942  . ipratropium (ATROVENT) nebulizer solution 0.5 mg  0.5 mg Nebulization Q6H PRN Alexis Hugelmeyer, DO      . magnesium citrate solution 1 Bottle  1 Bottle Oral Once PRN Alexis Hugelmeyer, DO      . meclizine (ANTIVERT) tablet 50 mg  50 mg Oral TID PRN Alexis Hugelmeyer, DO      . metoprolol succinate (TOPROL-XL) 24 hr tablet 50 mg  50 mg Oral Daily Alexis Hugelmeyer, DO   50 mg at 05/15/16 0942  . mometasone-formoterol (DULERA) 200-5 MCG/ACT inhaler 2 puff  2 puff Inhalation BID Alexis Hugelmeyer, DO   2 puff at 05/15/16 0943  . nitroGLYCERIN (NITROSTAT) SL tablet 0.4 mg  0.4 mg Sublingual Q5 min PRN Alexis Hugelmeyer, DO      . ondansetron (ZOFRAN) tablet 4 mg  4 mg Oral Q6H PRN Alexis Hugelmeyer, DO       Or  . ondansetron (ZOFRAN) injection 4 mg  4 mg Intravenous Q6H PRN Alexis  Hugelmeyer, DO      . oxyCODONE (Oxy IR/ROXICODONE) immediate release tablet 5 mg  5 mg Oral Q4H PRN Alexis Hugelmeyer, DO      . senna-docusate (Senokot-S) tablet 1 tablet  1 tablet Oral QHS PRN Alexis Hugelmeyer, DO      . tiotropium (SPIRIVA) inhalation capsule 18 mcg  18 mcg Inhalation Daily Alexis Hugelmeyer, DO   18 mcg at 05/15/16 0943  . venlafaxine [facility]) tablet 75 mg  75 mg Oral Q breakfast Alexis Hugelmeyer, DO   75 mg at 05/15/16 [id number]   And  . venlafaxine [facility]) tablet 37.5 mg  37.5 mg Oral Q supper [employer], DO         Discharge Medications: Please see discharge summary for a list of discharge medications.  Relevant Imaging Results:  Relevant Lab Results:   Additional Information SSN  [id number]  Ross Ludwig, LCSWA

## 2016-05-15 NOTE — Discharge Summary (Signed)
[place] at [place] NAME: Mckenzie Mclaughlin    MR#:  [id number]  DATE OF BIRTH:  [birth year]  DATE OF ADMISSION:  05/14/2016 ADMITTING PHYSICIAN: Harvie Bridge, DO  DATE OF DISCHARGE: 05/15/2016  PRIMARY CARE PHYSICIAN: Elsie Stain, MD    ADMISSION DIAGNOSIS:  Vertigo [R42] B12 deficiency [E53.8]  DISCHARGE DIAGNOSIS:  Active Problems:   Dizziness   SECONDARY DIAGNOSIS:   Past Medical History:  Diagnosis Date  . Anginal pain [facility])    see dr [name]  "Spams"  . Anxiety   . Arthritis   . Cancer (HCC)    side of head- skin cancer, squamous cell ca on left foot.  . Complication of anesthesia   . Constipation   . COPD (chronic obstructive pulmonary disease) ([place])   . COPD, severe 07/29/98  . Coronary artery disease 01/22/99   Non Q-wave MI, stent RCA  . Depression   . Depression   . Dysrhythmia    Palpations at times. last time 10/29 ish 2014  . Esophageal dilatation    Pt needs appple sauce to take meds.  . Esophageal stricture    PT needs apple sauce to take meds  . GERD (gastroesophageal reflux disease) 08/29/98   severe-no meds now  . Head injury, acute, with loss of consciousness ([place]) 11/2012   unsure how long  . History of blood transfusion   . History of blurry vision 05/06-05/10/2009   Hospital [facility] CP R/O'D Blurry vision, smoking  . History of CT scan of head 08/02/09   w/o mild age appropr atrophy  . History of ETT 08/03/09   Myoview Normal  . History of ETT 05/1999   Cardiolite pos ETT, neg Cardiolite  . History of ETT 09/02/01   Normal  . History of ETT 04/28/2006   Myoview normal EF 83%  . History of ETT 04/10/11   normal EF and no ischemia per Dr. Saralyn Pilar  . Hx of cardiac catheterization 08/29/01   60% RCA 0/w 20-30% lesions  . Hx of cardiac catheterization 01/22/99   w/Stent 90% RCA lesion  . Hypertension    03/30/00  . Mental disorder   . On home oxygen therapy    as needed-has a travel  tank  . Pneumothorax 2/14  . PONV (postoperative nausea and vomiting)    ad breast remocved and see was under anesthesia a long time  . Shortness of breath   . Status post aortic coarctation stent placement     HOSPITAL COURSE:  76 year old female with a history of vertigo and COPD on chronic oxygen who presents with dizziness.  1. Vertigo: This has resolved  2. Generalized weakness due to receiving medications for vertigo and anxiety. Physical therapy Has recommended skilled nursing facility.  3. Chronic cough: This is due to chronic bronchitis from COPD   4. Hypokalemia: Repleted  5. History of hypertension: Continue metoprolol  6. History of COPD without exacerbation Continue inhalers  7. History of CAD: Continue aspirin, Plavix, statin    8 history of depression: Continue Effexor and when necessary Xanax   DISCHARGE CONDITIONS AND DIET:   Stable for discharge resume heart healthy diet  CONSULTS OBTAINED:    DRUG ALLERGIES:   Allergies  Allergen Reactions  . Amoxicillin Shortness Of Breath  . Doxycycline Nausea Only and Other (See Comments)    Dizzy Reaction:  Dizziness   . Sertraline Hcl Nausea And Vomiting    REACTION: trembling    DISCHARGE MEDICATIONS:  Current Discharge Medication List    START taking these medications   Details  meclizine (ANTIVERT) 25 MG tablet Take 1 tablet (25 mg total) by mouth 3 (three) times daily as needed for dizziness. Qty: 30 tablet, Refills: 0      CONTINUE these medications which have NOT CHANGED   Details  albuterol (PROAIR HFA) 108 (90 Base) MCG/ACT inhaler INHALE TWO PUFFS BY MOUTH EVERY 6 HOURS AS NEEDED Qty: 18 each, Refills: 6    ALPRAZolam (XANAX) 0.5 MG tablet Take 1 tablet (0.5 mg total) by mouth 3 (three) times daily as needed. Qty: 90 tablet, Refills: 5    aspirin EC 81 MG tablet Take 81 mg by mouth daily.    atorvastatin (LIPITOR) 20 MG tablet TAKE 1 TABLET BY MOUTH ONCE A DAY Qty:  30 tablet, Refills: 12    budesonide-formoterol (SYMBICORT) 160-4.5 MCG/ACT inhaler Inhale 2 puffs into the lungs 2 (two) times daily. Qty: 1 Inhaler, Refills: 5    clopidogrel (PLAVIX) 75 MG tablet TAKE 1 TABLET BY MOUTH ONCE A DAY Qty: 30 tablet, Refills: 12    furosemide (LASIX) 20 MG tablet Take 20 mg by mouth daily.    metoprolol succinate (TOPROL-XL) 50 MG 24 hr tablet Take 1 tablet by mouth daily.    nitroGLYCERIN (NITROSTAT) 0.4 MG SL tablet Place 1 tablet (0.4 mg total) under the tongue every 5 (five) minutes as needed for chest pain (max 3 doses in 15 min). Qty: 25 tablet, Refills: 2    polyethylene glycol powder (GLYCOLAX/MIRALAX) powder Take by mouth.    tiotropium (SPIRIVA HANDIHALER) 18 MCG inhalation capsule INHALE ONE DOSE ONCE DAILY    venlafaxine (EFFEXOR) 37.5 MG tablet 2 tabs in the AM, 1 tab in the PM. Qty: 90 tablet, Refills: 5          Today   CHIEF COMPLAINT:   doing well  Vertigo resolved. Has generalized weakness no focal deficits   VITAL SIGNS:  Blood pressure (!) 144/61, pulse 86, temperature 98.3 F (36.8 C), resp. rate 20, height 5\' 7"  (1.702 m), weight 71.4 kg (157 lb 4.8 oz), SpO2 100 %.   REVIEW OF SYSTEMS:  Review of Systems  Constitutional: Negative for chills, fever and malaise/fatigue.  HENT: Negative.  Negative for ear discharge, ear pain, hearing loss, nosebleeds and sore throat.   Eyes: Negative.  Negative for blurred vision and pain.  Respiratory: Positive for cough. Negative for hemoptysis, shortness of breath and wheezing.   Cardiovascular: Negative.  Negative for chest pain, palpitations and leg swelling.  Gastrointestinal: Negative.  Negative for abdominal pain, blood in stool, diarrhea, nausea and vomiting.  Genitourinary: Negative.  Negative for dysuria.  Musculoskeletal: Negative.  Negative for back pain.  Skin: Negative.   Neurological: Positive for weakness. Negative for dizziness, tremors, speech change, focal  weakness, seizures and headaches.  Endo/Heme/Allergies: Negative.  Does not bruise/bleed easily.  Psychiatric/Behavioral: Negative.  Negative for depression, hallucinations and suicidal ideas.     PHYSICAL EXAMINATION:  GENERAL:  75 y.o.-year-old patient lying in the bed with no acute distress.  NECK:  Supple, no jugular venous distention. No thyroid enlargement, no tenderness.  LUNGS: Normal breath sounds bilaterally, no wheezing, rales,++bilateral rhonchi  No use of accessory muscles of respiration.  CARDIOVASCULAR: S1, S2 normal. No murmurs, rubs, or gallops.  ABDOMEN: Soft, non-tender, non-distended. Bowel sounds present. No organomegaly or mass.  EXTREMITIES: No pedal edema, cyanosis, or clubbing.  PSYCHIATRIC: The patient is alert and oriented x 3.  SKIN: No  obvious rash, lesion, or ulcer.   DATA REVIEW:   CBC  Recent Labs Lab 05/15/16 0605  WBC 7.5  HGB 12.7  HCT 38.4  PLT 196    Chemistries   Recent Labs Lab 05/14/16 1754 05/15/16 0605  NA 140 143  K 3.0* 3.8  CL 99* 106  CO2 34* 31  GLUCOSE 114* 85  BUN 19 15  CREATININE 0.84 0.82  CALCIUM 9.3 8.6*  MG 1.8  --   AST 26  --   ALT 20  --   ALKPHOS 72  --   BILITOT 0.6  --     Cardiac Enzymes  Recent Labs Lab 05/14/16 1754  TROPONINI <0.03    Microbiology Results  @[email or web address] @  RADIOLOGY:  Dg Chest 2 View  Result Date: 05/14/2016 CLINICAL DATA:  76 y/o F; weakness and shortness of breath, and wheezing. EXAM: CHEST  2 VIEW COMPARISON:  02/03/2016 chest radiograph. FINDINGS: Stable cardiac silhouette within normal limits given projection and technique. Aortic atherosclerosis with calcification. Emphysema with upper lobe predominance. Bronchitic changes in the lung bases. No focal consolidation. No pleural effusion. No pneumothorax. Demineralized bones. No acute osseous abnormality is identified. IMPRESSION: Emphysema. Aortic atherosclerosis. Lung base bronchitic changes. No focal consolidation.  Electronically Signed   By: Kristine Garbe M.D.   On: 05/14/2016 22:05   Ct Head Wo Contrast  Result Date: 05/14/2016 CLINICAL DATA:  Dizziness and nausea. EXAM: CT HEAD WITHOUT CONTRAST TECHNIQUE: Contiguous axial images were obtained from the base of the skull through the vertex without intravenous contrast. COMPARISON:  08/02/2009 FINDINGS: Brain: There is no evidence of acute cortical infarct, intracranial hemorrhage, mass, midline shift, or extra-axial fluid collection. Mild cerebral atrophy is unchanged. Periventricular white matter hypodensities are nonspecific but compatible with mild chronic small vessel ischemic disease. Vascular: Calcified atherosclerosis at the skullbase. No hyperdense vessel. Skull: No fracture or focal osseous lesion. Sinuses/Orbits: No significant sinus disease. Prior bilateral cataract extraction. Other: None. IMPRESSION: 1. No evidence of acute intracranial abnormality. 2. Mild chronic small vessel ischemic disease. Electronically Signed   By: Logan Bores M.D.   On: 05/14/2016 18:21      Current Discharge Medication List    START taking these medications   Details  meclizine (ANTIVERT) 25 MG tablet Take 1 tablet (25 mg total) by mouth 3 (three) times daily as needed for dizziness. Qty: 30 tablet, Refills: 0      CONTINUE these medications which have NOT CHANGED   Details  albuterol (PROAIR HFA) 108 (90 Base) MCG/ACT inhaler INHALE TWO PUFFS BY MOUTH EVERY 6 HOURS AS NEEDED Qty: 18 each, Refills: 6    ALPRAZolam (XANAX) 0.5 MG tablet Take 1 tablet (0.5 mg total) by mouth 3 (three) times daily as needed. Qty: 90 tablet, Refills: 5    aspirin EC 81 MG tablet Take 81 mg by mouth daily.    atorvastatin (LIPITOR) 20 MG tablet TAKE 1 TABLET BY MOUTH ONCE A DAY Qty: 30 tablet, Refills: 12    budesonide-formoterol (SYMBICORT) 160-4.5 MCG/ACT inhaler Inhale 2 puffs into the lungs 2 (two) times daily. Qty: 1 Inhaler, Refills: 5    clopidogrel  (PLAVIX) 75 MG tablet TAKE 1 TABLET BY MOUTH ONCE A DAY Qty: 30 tablet, Refills: 12    furosemide (LASIX) 20 MG tablet Take 20 mg by mouth daily.    metoprolol succinate (TOPROL-XL) 50 MG 24 hr tablet Take 1 tablet by mouth daily.    nitroGLYCERIN (NITROSTAT) 0.4 MG SL tablet Place 1 tablet (  0.4 mg total) under the tongue every 5 (five) minutes as needed for chest pain (max 3 doses in 15 min). Qty: 25 tablet, Refills: 2    polyethylene glycol powder (GLYCOLAX/MIRALAX) powder Take by mouth.    tiotropium (SPIRIVA HANDIHALER) 18 MCG inhalation capsule INHALE ONE DOSE ONCE DAILY    venlafaxine (EFFEXOR) 37.5 MG tablet 2 tabs in the AM, 1 tab in the PM. Qty: 90 tablet, Refills: 5         Management plans discussed with the patient and she is in agreement. Stable for discharge   Patient should follow up with pcp  CODE STATUS:     Code Status Orders        Start     Ordered   05/15/16 0052  Full code  Continuous     05/15/16 0051    Code Status History    Date Active Date Inactive Code Status Order ID Comments User Context   05/14/2013 12:07 PM 05/19/2013  5:37 PM Full Code [id number]  Domenic Polite, MD Inpatient   06/17/2012 11:52 PM 06/22/2012  4:15 PM Full Code [id number]  Orvan Falconer, MD Inpatient      TOTAL TIME TAKING CARE OF THIS PATIENT: 37 minutes.    Note: This dictation was prepared with Dragon dictation along with smaller phrase technology. Any transcriptional errors that result from this process are unintentional.  Hiroto Saltzman M.D on 05/15/2016 at 12:09 PM  Between 7am to 6pm - Pager - [phone] After 6pm go to [email or web address] - password [email or web address] [facility]  Office  [phone]  CC: Primary care physician; Elsie Stain, MD

## 2016-05-15 NOTE — Clinical Social Work Note (Signed)
Patient to be d/c'ed today to [facility].  Patient and family agreeable to plans will transport via husband's vehicle, RN to call report to room 210B [phone].  Evette Cristal, MSW, [place]

## 2016-05-15 NOTE — Progress Notes (Signed)
Report called to Benjie Karvonen, Therapist, sports at [facility].  Husband brought in portable oxygen tank and will transport patient.  Clarise Cruz, RN

## 2016-05-15 NOTE — Evaluation (Addendum)
Physical Therapy Evaluation Patient Details Name: Mckenzie Mclaughlin MRN: [id number] DOB: 1940-04-23 Today's Date: 05/15/2016   History of Present Illness  Mckenzie Mclaughlin is a 76 y.o. female with a known history of vertigo, HTN, COPD, CAD was in a usual state of health until yesterday afternoon when she describes worsening dizziness associated with nausea. Patient has a history of vertigo. Her only other complaint is a chronic cough for the past 2 months which is productive of some yellowish sputum and associated with generalized weakness. Otherwise there has been no change in status. Patient has been taking medication as prescribed and there has been no recent change in medication or diet.  No recent antibiotics.  There has been no recent illness, hospitalizations, travel or sick contacts. Patient denies fevers/chills, weakness, dizziness, chest pain, shortness of breath, N/V/C/D, abdominal pain, dysuria/frequency, changes in mental status.  Clinical Impression  Pt admitted with above diagnosis. Pt currently with functional limitations due to the deficits listed below (see PT Problem List).  Pt requires assist for transfers and ambulation due to generalized weakness and fatigued. She ambulates with significant decrease in normal gait speed. Short steps and pt leans heavily on walker. VSS throughout ambulation on 2L/min but pt reports severe fatigue. She turns around in the hallway and almost doesn't make it back to bed before legs collapse. Vestibular screening reveals no spontaneous or gaze evoked nystagmus. Normal ocular alignment and ROM. Smooth pursuit is slow and saccadic and horizontal/vertical saccade testing is also abnormally slow. VOR is positive for dizziness and VOR thrust is positive to the right. No head shaking nystagmus. She denies any auditory or visual changes with symptoms. No abnormal neurological findings. Pt has had a recent productive cough for 2 months and symptoms could be possible R  sided vestibular neuritis. Consult to ENT if symptoms are believed to be of vestibular etiology. Orthostatics obtained during evaluation and are negative. Pt is exceedingly weak with therapist and will need SNF placement at discharge in order to facilitate safe return home. Pt will benefit from skilled PT services to address deficits in strength, balance, and mobility in order to return to full function at home.     Follow Up Recommendations SNF    Equipment Recommendations  None recommended by PT    Recommendations for Other Services       Precautions / Restrictions Precautions Precautions: Fall Restrictions Weight Bearing Restrictions: No      Mobility  Bed Mobility Overal bed mobility: Modified Independent             General bed mobility comments: HOB elevated, bed rails, increased time. Pt moves very slowly  Transfers Overall transfer level: Needs assistance Equipment used: Rolling walker (2 wheeled) Transfers: Sit to/from Stand Sit to Stand: Min assist         General transfer comment: Pt requires minA+1 to stabilize. Performs transfers slowly with LE weakness evident. Heavy UE reliance. Able to stabilize once standing with bilateral UE support on walker  Ambulation/Gait Ambulation/Gait assistance: Min assist Ambulation Distance (Feet): 60 Feet Assistive device: Rolling walker (2 wheeled) Gait Pattern/deviations: Decreased step length - right;Decreased step length - left;Trunk flexed Gait velocity: Decreased Gait velocity interpretation: <1.8 ft/sec, indicative of risk for recurrent falls General Gait Details: Pt ambulates with significant decrease in normal gait speed. Short steps and pt leans heavily on walker. VSS throughout ambulation on 2L/min but pt reports severe fatigue. She turns around in the hallway and almost doesn't make it back to bed before  legs collapse.  Stairs            Wheelchair Mobility    Modified Rankin (Stroke Patients Only)        Balance Overall balance assessment: Needs assistance Sitting-balance support: No upper extremity supported Sitting balance-Leahy Scale: Good     Standing balance support: Bilateral upper extremity supported Standing balance-Leahy Scale: Fair Standing balance comment: Fair balance with feet apart. Struggles to place feet together. Positive Rhomberg and single leg balance is approximately 2 seconds on each LE                              Description of dizziness:  vertigo, lightheadedness, general unsteadiness Duration: Constant, gradually improving since admission Symptom nature: constant  Provocative Factors: unknown Easing Factors: improved since admission, unable to report known easing factors  Progression of symptoms: (better, worse, no change since onset): better History of similar episodes: History of vertigo  Falls (yes/no): No Number of falls in past 6 months: None  Auditory complaints (tinnitus, pain, drainage): Denies Vision (last eye exam, diplopia, recent changes): Denies  Current Symptoms: Denies dysarthria, headache, Confirms: vertigo rocking, general unsteadiness, imbalance, migraines)  POSTURE: Forward head and rounded shoulders.    SOMATOSENSORY:         Sensation           Intact      Diminished         Absent  Light touch Appears intact grossly bilateral UE/LE      COORDINATION: Finger to Nose: WNL Pronator Drift: Negative; Heel to shin: WNL  Gait: Scanning of visual environment with gait is: very limited during ambulation.   Balance: Heavy UE support required with transfers and in standing  OCULOMOTOR / VESTIBULAR TESTING:  Oculomotor Exam- Room Light  Normal Abnormal Comments  Ocular Alignment WNL    Ocular ROM WNL    Spontaneous Nystagmus WNL    End-Gaze Nystagmus WNL  Age appropriate  Smooth Pursuit  Abn Saccadic and slow  Saccades  Abn Multiple saccades to hit target, exceedingly slow  VOR  Abn Blurring and double  vision  VOR Cancellation   NT  Left Head Thrust WNL    Right Head Thrust  Abn Corrective saccades required for all trials  Head Shaking Nystagmus WNL    Static Acuity     Dynamic Acuity      BPPV TESTS: Deferred testing at this time due to pt feeling unwell. History not consistent with BPPV     Pertinent Vitals/Pain Pain Assessment: No/denies pain    Home Living Family/patient expects to be discharged to:: Private residence Living Arrangements: Spouse/significant other Available Help at Discharge: Family Type of Home: House Home Access: Stairs to enter Entrance Stairs-Rails: Psychiatric nurse of Steps: 3 Home Layout: One level Home Equipment: Environmental consultant - 2 wheels;Cane - single point;Shower seat;Bedside commode (home O2, no hospital bed, no wc)      Prior Function Level of Independence: Needs assistance   Gait / Transfers Assistance Needed: Independent for ambulation limited community distances without assistive device. Limited by breathing. No falls in the last 12 months.   ADL's / Homemaking Assistance Needed: Independent with ADLs, assist from husband with IADLs        Hand Dominance   Dominant Hand: Right    Extremity/Trunk Assessment   Upper Extremity Assessment Upper Extremity Assessment: Generalized weakness    Lower Extremity Assessment Lower Extremity Assessment: Generalized weakness  Communication   Communication: No difficulties  Cognition Arousal/Alertness: Awake/alert Behavior During Therapy: WFL for tasks assessed/performed Overall Cognitive Status: Within Functional Limits for tasks assessed                      General Comments      Exercises     Assessment/Plan    PT Assessment Patient needs continued PT services  PT Problem List Decreased strength;Decreased activity tolerance;Decreased balance;Decreased mobility;Decreased knowledge of use of DME;Decreased safety awareness;Other (comment) (Dizziness)           PT Treatment Interventions DME instruction;Gait training;Functional mobility training;Therapeutic activities;Therapeutic exercise;Balance training;Neuromuscular re-education;Patient/family education    PT Goals (Current goals can be found in the Care Plan section)  Acute Rehab PT Goals Patient Stated Goal: Return to prior function at home PT Goal Formulation: With patient/family Time For Goal Achievement: 05/29/16 Potential to Achieve Goals: Good    Frequency Min 2X/week   Barriers to discharge Decreased caregiver support Lives with husband but he is unable to provide all the care she requires currently    Co-evaluation               End of Session Equipment Utilized During Treatment: Gait belt Activity Tolerance: Patient limited by fatigue Patient left: in bed;with call bell/phone within reach;with bed alarm set;with family/visitor present      Functional Assessment Tool Used: clinical judgement Functional Limitation: Mobility: Walking and moving around Mobility: Walking and Moving Around Current Status [id number]): At least 40 percent but less than 60 percent impaired, limited or restricted Mobility: Walking and Moving Around Goal Status [phone]): At least 20 percent but less than 40 percent impaired, limited or restricted    Time: 0832-0928 PT Time Calculation (min) (ACUTE ONLY): 56 min   Charges:   PT Evaluation $PT Eval Moderate Complexity: 1 Procedure PT Treatments $Gait Training: 8-22 mins   PT G Codes:   PT G-Codes **NOT FOR INPATIENT CLASS** Functional Assessment Tool Used: clinical judgement Functional Limitation: Mobility: Walking and moving around Mobility: Walking and Moving Around Current Status [id number]): At least 40 percent but less than 60 percent impaired, limited or restricted Mobility: Walking and Moving Around Goal Status [phone]): At least 20 percent but less than 40 percent impaired, limited or restricted   Phillips Grout PT, DPT    Mckenzie Mclaughlin 05/15/2016, 11:06 AM

## 2016-05-15 NOTE — Clinical Social Work Note (Signed)
CSW presented bed offers to patient and she chose [facility].  CSW contacted [facility] who said they can accept patient today, patient expressed that she is glad she is able to go to SNF of her choice.  Patient stated she would like to have her husband transport patient to SNF, she said they have oxygen tank in car that she uses.  Jones Broom. [place], MSW, [place]  05/15/2016 2:21 PM

## 2016-05-18 DIAGNOSIS — J9611 Chronic respiratory failure with hypoxia: Secondary | ICD-10-CM | POA: Diagnosis not present

## 2016-05-18 DIAGNOSIS — R531 Weakness: Secondary | ICD-10-CM | POA: Diagnosis not present

## 2016-05-18 DIAGNOSIS — R42 Dizziness and giddiness: Secondary | ICD-10-CM | POA: Diagnosis not present

## 2016-05-18 DIAGNOSIS — J439 Emphysema, unspecified: Secondary | ICD-10-CM | POA: Diagnosis not present

## 2016-05-18 DIAGNOSIS — J410 Simple chronic bronchitis: Secondary | ICD-10-CM | POA: Diagnosis not present

## 2016-05-19 ENCOUNTER — Ambulatory Visit: Payer: Medicare Other | Admitting: Urology

## 2016-05-20 ENCOUNTER — Ambulatory Visit: Payer: Medicare Other

## 2016-05-26 ENCOUNTER — Non-Acute Institutional Stay (SKILLED_NURSING_FACILITY): Payer: Medicare Other | Admitting: Gerontology

## 2016-05-26 DIAGNOSIS — F419 Anxiety disorder, unspecified: Secondary | ICD-10-CM

## 2016-05-26 DIAGNOSIS — F418 Other specified anxiety disorders: Secondary | ICD-10-CM | POA: Diagnosis not present

## 2016-05-26 DIAGNOSIS — R42 Dizziness and giddiness: Secondary | ICD-10-CM | POA: Diagnosis not present

## 2016-05-26 DIAGNOSIS — F329 Major depressive disorder, single episode, unspecified: Secondary | ICD-10-CM

## 2016-05-28 ENCOUNTER — Encounter
Admission: RE | Admit: 2016-05-28 | Discharge: 2016-05-28 | Disposition: A | Discharge status: Home / Self Care | Payer: Medicare Other | Source: Ambulatory Visit | Attending: Internal Medicine | Admitting: Internal Medicine

## 2016-05-29 ENCOUNTER — Non-Acute Institutional Stay (SKILLED_NURSING_FACILITY): Payer: Medicare Other | Admitting: Gerontology

## 2016-05-29 DIAGNOSIS — R42 Dizziness and giddiness: Secondary | ICD-10-CM | POA: Diagnosis not present

## 2016-05-29 DIAGNOSIS — F329 Major depressive disorder, single episode, unspecified: Secondary | ICD-10-CM

## 2016-05-29 DIAGNOSIS — F419 Anxiety disorder, unspecified: Secondary | ICD-10-CM

## 2016-05-29 DIAGNOSIS — F418 Other specified anxiety disorders: Secondary | ICD-10-CM | POA: Diagnosis not present

## 2016-05-29 DIAGNOSIS — F32A Depression, unspecified: Secondary | ICD-10-CM

## 2016-06-01 ENCOUNTER — Telehealth: Payer: Self-pay | Admitting: *Deleted

## 2016-06-01 ENCOUNTER — Ambulatory Visit: Payer: Medicare Other

## 2016-06-01 ENCOUNTER — Telehealth: Payer: Self-pay

## 2016-06-01 DIAGNOSIS — J441 Chronic obstructive pulmonary disease with (acute) exacerbation: Secondary | ICD-10-CM | POA: Diagnosis not present

## 2016-06-01 DIAGNOSIS — I10 Essential (primary) hypertension: Secondary | ICD-10-CM | POA: Diagnosis not present

## 2016-06-01 DIAGNOSIS — Z7982 Long term (current) use of aspirin: Secondary | ICD-10-CM | POA: Diagnosis not present

## 2016-06-01 DIAGNOSIS — E538 Deficiency of other specified B group vitamins: Secondary | ICD-10-CM | POA: Diagnosis not present

## 2016-06-01 DIAGNOSIS — M1990 Unspecified osteoarthritis, unspecified site: Secondary | ICD-10-CM | POA: Diagnosis not present

## 2016-06-01 DIAGNOSIS — Z9981 Dependence on supplemental oxygen: Secondary | ICD-10-CM | POA: Diagnosis not present

## 2016-06-01 DIAGNOSIS — I2510 Atherosclerotic heart disease of native coronary artery without angina pectoris: Secondary | ICD-10-CM | POA: Diagnosis not present

## 2016-06-01 DIAGNOSIS — Z955 Presence of coronary angioplasty implant and graft: Secondary | ICD-10-CM | POA: Diagnosis not present

## 2016-06-01 DIAGNOSIS — R42 Dizziness and giddiness: Secondary | ICD-10-CM | POA: Diagnosis not present

## 2016-06-01 DIAGNOSIS — Z7902 Long term (current) use of antithrombotics/antiplatelets: Secondary | ICD-10-CM | POA: Diagnosis not present

## 2016-06-01 NOTE — Telephone Encounter (Signed)
If the patient is not taking Lasix, then please take this off the med list. At that point the med lists should match up. Thanks.

## 2016-06-01 NOTE — Telephone Encounter (Signed)
Please give the order.  The med list we have here looks accurate.  If there is a concern or discrepancy, then let me know.  Okay to send them a copy of current med list.  Thanks.

## 2016-06-01 NOTE — Telephone Encounter (Signed)
Please give the order.  Thanks.   

## 2016-06-01 NOTE — Telephone Encounter (Signed)
[facility]ckenzie Mclaughlin with [facility] contacted office stating pt recently released from [place] [facility] [place]. She is requesting home health orders 2xs/wk for 4wks and 1/wk for 5wks and 2 PRN visits to help manage her COPD and dizziness. She is also requesting Dr Damita Dunnings to review her medication list and Hx to ensure all meds are correct.

## 2016-06-01 NOTE — Telephone Encounter (Signed)
Mckenzie Mclaughlin with [facility] notified as instructed by telephone and verbalized understanding. Mckenzie Mclaughlin stated that the list that she has from [facility] [place] does not have Lasix on it.  Medication list faxed to Mckenzie Mclaughlin at [phone].

## 2016-06-01 NOTE — Telephone Encounter (Signed)
Mckenzie Mclaughlin Pt with [facility] left v/m requesting verbal orders for [facility] PT 2 x a week for 8 more weeks  To continue for cardio pulmonary training, balance and gait training.

## 2016-06-02 NOTE — Telephone Encounter (Signed)
Spoke to [facility] who states pt re-started lasix after being d/c. Medication remains on med list

## 2016-06-02 NOTE — Telephone Encounter (Signed)
Spoke to [place] and provided verbal orders for [facility]

## 2016-06-03 DIAGNOSIS — I2510 Atherosclerotic heart disease of native coronary artery without angina pectoris: Secondary | ICD-10-CM | POA: Diagnosis not present

## 2016-06-03 DIAGNOSIS — R42 Dizziness and giddiness: Secondary | ICD-10-CM | POA: Diagnosis not present

## 2016-06-03 DIAGNOSIS — I10 Essential (primary) hypertension: Secondary | ICD-10-CM | POA: Diagnosis not present

## 2016-06-03 DIAGNOSIS — Z955 Presence of coronary angioplasty implant and graft: Secondary | ICD-10-CM | POA: Diagnosis not present

## 2016-06-03 DIAGNOSIS — M1990 Unspecified osteoarthritis, unspecified site: Secondary | ICD-10-CM | POA: Diagnosis not present

## 2016-06-03 DIAGNOSIS — Z7982 Long term (current) use of aspirin: Secondary | ICD-10-CM | POA: Diagnosis not present

## 2016-06-03 DIAGNOSIS — J441 Chronic obstructive pulmonary disease with (acute) exacerbation: Secondary | ICD-10-CM | POA: Diagnosis not present

## 2016-06-03 DIAGNOSIS — Z9981 Dependence on supplemental oxygen: Secondary | ICD-10-CM | POA: Diagnosis not present

## 2016-06-03 DIAGNOSIS — E538 Deficiency of other specified B group vitamins: Secondary | ICD-10-CM | POA: Diagnosis not present

## 2016-06-03 DIAGNOSIS — Z7902 Long term (current) use of antithrombotics/antiplatelets: Secondary | ICD-10-CM | POA: Diagnosis not present

## 2016-06-03 NOTE — Telephone Encounter (Signed)
Noted. Thanks.

## 2016-06-04 ENCOUNTER — Ambulatory Visit (INDEPENDENT_AMBULATORY_CARE_PROVIDER_SITE_OTHER): Payer: Medicare Other

## 2016-06-04 ENCOUNTER — Encounter (INDEPENDENT_AMBULATORY_CARE_PROVIDER_SITE_OTHER): Payer: Self-pay

## 2016-06-04 DIAGNOSIS — E538 Deficiency of other specified B group vitamins: Secondary | ICD-10-CM | POA: Diagnosis not present

## 2016-06-04 MED ORDER — CYANOCOBALAMIN 1000 MCG/ML IJ SOLN
1000.0000 ug | Freq: Once | INTRAMUSCULAR | Status: AC
  Administered 2016-06-04: 1000 ug via INTRAMUSCULAR

## 2016-06-05 DIAGNOSIS — E538 Deficiency of other specified B group vitamins: Secondary | ICD-10-CM | POA: Diagnosis not present

## 2016-06-05 DIAGNOSIS — I10 Essential (primary) hypertension: Secondary | ICD-10-CM | POA: Diagnosis not present

## 2016-06-05 DIAGNOSIS — I2510 Atherosclerotic heart disease of native coronary artery without angina pectoris: Secondary | ICD-10-CM | POA: Diagnosis not present

## 2016-06-05 DIAGNOSIS — Z7902 Long term (current) use of antithrombotics/antiplatelets: Secondary | ICD-10-CM | POA: Diagnosis not present

## 2016-06-05 DIAGNOSIS — M1990 Unspecified osteoarthritis, unspecified site: Secondary | ICD-10-CM | POA: Diagnosis not present

## 2016-06-05 DIAGNOSIS — R42 Dizziness and giddiness: Secondary | ICD-10-CM | POA: Diagnosis not present

## 2016-06-05 DIAGNOSIS — Z955 Presence of coronary angioplasty implant and graft: Secondary | ICD-10-CM | POA: Diagnosis not present

## 2016-06-05 DIAGNOSIS — Z9981 Dependence on supplemental oxygen: Secondary | ICD-10-CM | POA: Diagnosis not present

## 2016-06-05 DIAGNOSIS — Z7982 Long term (current) use of aspirin: Secondary | ICD-10-CM | POA: Diagnosis not present

## 2016-06-05 DIAGNOSIS — J441 Chronic obstructive pulmonary disease with (acute) exacerbation: Secondary | ICD-10-CM | POA: Diagnosis not present

## 2016-06-05 NOTE — Progress Notes (Signed)
Location:      Place of Service:  SNF (31) Provider:  Toni Arthurs, NP-C  Elsie Stain, MD  Patient Care Team: Tonia Ghent, MD as PCP - General (Family Medicine) Grace Isaac, MD as Consulting Physician (Cardiothoracic Surgery) Nestor Lewandowsky, MD as Referring Physician (Thoracic Diseases) Erby Pian, MD as Referring Physician Erby Pian, MD as Referring Physician  Extended Emergency Contact Information Primary Emergency Contact: Norton,Richard Address: [address]          [place], [place] [address] Johnnette Litter of [place] Phone: [phone] Relation: Spouse Secondary Emergency Contact: Geralyn Corwin Address: [place], [place] [address] Johnnette Litter of [place] Phone: [phone] Mobile Phone: [phone] Relation: Sister  Code Status:  full Goals of care: Advanced Directive information Advanced Directives 05/15/2016  Does Patient Have a Medical Advance Directive? No  Type of Advance Directive -  Does patient want to make changes to medical advance directive? -  Copy of [place] in Chart? -  Would patient like information on creating a medical advance directive? No - Patient declined  Pre-existing out of facility DNR order (yellow form or pink MOST form) -     Chief Complaint  Patient presents with  . Discharge Note    HPI:  Pt is a 76 y.o. female seen today for a follow up visit for dizziness and vertigo and discharge evaluation. Adjustments were made to her anti-depressant medication and anti-anxiety medications. Pt reported the "room is spinning" even when she is lying down and her eyes are closed. She endorses that if she turns her head quickly, she becomes dizzy. Pt reports she is eating well at home, drinking well. No chest pain or shortness of breath. After a great deal of discussion about the dizziness/ symptoms wel discussed any recent changes in her health or medications. She reports she has  been on alprazolam for her anxiety for a while and had not been taking the effexor "very long." She then reports she has "felt this way" ever since she started taking this medication. We then discussed changing her medications, she agrees to this plan "if I think it might help." Today, pt reports she is feeling better. Still has occasional, mild dizziness, but much better than what it was on admission. Pt is now able to follow my finger with the 6 fields of gaze without becoming dizzy or nauseated. Pt is scheduled to discharge today.Pt and husband verbalizes understanding about follow up with PCP so they can monitor progress and safety. VSS. No other complaints.    Past Medical History:  Diagnosis Date  . Anginal pain [facility])    see dr [name]  "Spams"  . Anxiety   . Arthritis   . Cancer (HCC)    side of head- skin cancer, squamous cell ca on left foot.  . Complication of anesthesia   . Constipation   . COPD (chronic obstructive pulmonary disease) ([place])   . COPD, severe 07/29/98  . Coronary artery disease 01/22/99   Non Q-wave MI, stent RCA  . Depression   . Depression   . Dysrhythmia    Palpations at times. last time 10/29 ish 2014  . Esophageal dilatation    Pt needs appple sauce to take meds.  . Esophageal stricture    PT needs apple sauce to take meds  . GERD (gastroesophageal reflux disease) 08/29/98   severe-no meds now  . Head  injury, acute, with loss of consciousness ([place]) 11/2012   unsure how long  . History of blood transfusion   . History of blurry vision 05/06-05/10/2009   Hospital [facility] CP R/O'D Blurry vision, smoking  . History of CT scan of head 08/02/09   w/o mild age appropr atrophy  . History of ETT 08/03/09   Myoview Normal  . History of ETT 05/1999   Cardiolite pos ETT, neg Cardiolite  . History of ETT 09/02/01   Normal  . History of ETT 04/28/2006   Myoview normal EF 83%  . History of ETT 04/10/11   normal EF and no ischemia per Dr. Saralyn Pilar  . Hx of  cardiac catheterization 08/29/01   60% RCA 0/w 20-30% lesions  . Hx of cardiac catheterization 01/22/99   w/Stent 90% RCA lesion  . Hypertension    03/30/00  . Mental disorder   . On home oxygen therapy    as needed-has a travel tank  . Pneumothorax 2/14  . PONV (postoperative nausea and vomiting)    ad breast remocved and see was under anesthesia a long time  . Shortness of breath   . Status post aortic coarctation stent placement    Past Surgical History:  Procedure Laterality Date  . ABDOMINAL HYSTERECTOMY    . BACK SURGERY    . BREAST SURGERY     implant removal, R breast  . CAROTID STENT Right 2000  . CATARACT EXTRACTION Bilateral    2016  . COLONOSCOPY    . ESOPHAGEAL DILATION  06/00   EGD  . LAMINECTOMY  1976   Disc Removal X 2 Lumbar  . MASTECTOMY  03/1976   Bilateral due FCBD with implants [facility])  . OOPHORECTOMY    . TISSUE EXPANDER PLACEMENT Right 02/03/2013   Procedure: REMOVAL OF RIGHT BREAST IMPLANT AND IMPLANT MATERIAL  CAPSULECTOMY;  Surgeon: Irene Limbo, MD;  Location: [facility];  Service: Plastics;  Laterality: Right;    Allergies  Allergen Reactions  . Amoxicillin Shortness Of Breath  . Doxycycline Nausea Only and Other (See Comments)    Dizzy Reaction:  Dizziness   . Sertraline Hcl Nausea And Vomiting    REACTION: trembling    Allergies as of 05/29/2016      Reactions   Amoxicillin Shortness Of Breath   Doxycycline Nausea Only, Other (See Comments)   Dizzy Reaction:  Dizziness    Sertraline Hcl Nausea And Vomiting   REACTION: trembling      Medication List       Accurate as of 05/29/16 11:59 PM. Always use your most recent med list.          albuterol 108 (90 Base) MCG/ACT inhaler Commonly known as:  PROAIR HFA INHALE TWO PUFFS BY MOUTH EVERY 6 HOURS AS NEEDED   ALPRAZolam 0.5 MG tablet Commonly known as:  XANAX Take 1 tablet (0.5 mg total) by mouth 3 (three) times daily as needed.   aspirin EC 81 MG tablet Take 81 mg by mouth  daily.   atorvastatin 20 MG tablet Commonly known as:  LIPITOR TAKE 1 TABLET BY MOUTH ONCE A DAY   budesonide-formoterol 160-4.5 MCG/ACT inhaler Commonly known as:  SYMBICORT Inhale 2 puffs into the lungs 2 (two) times daily.   clopidogrel 75 MG tablet Commonly known as:  PLAVIX TAKE 1 TABLET BY MOUTH ONCE A DAY   furosemide 20 MG tablet Commonly known as:  LASIX Take 20 mg by mouth daily.   meclizine 25 MG tablet Commonly known  as:  ANTIVERT Take 1 tablet (25 mg total) by mouth 3 (three) times daily as needed for dizziness.   metoprolol succinate 50 MG 24 hr tablet Commonly known as:  TOPROL-XL Take 1 tablet by mouth daily.   nitroGLYCERIN 0.4 MG SL tablet Commonly known as:  NITROSTAT Place 1 tablet (0.4 mg total) under the tongue every 5 (five) minutes as needed for chest pain (max 3 doses in 15 min).   polyethylene glycol powder powder Commonly known as:  GLYCOLAX/MIRALAX Take by mouth.   SPIRIVA HANDIHALER 18 MCG inhalation capsule Generic drug:  tiotropium INHALE ONE DOSE ONCE DAILY   venlafaxine 37.5 MG tablet Commonly known as:  EFFEXOR 2 tabs in the AM, 1 tab in the PM.       Review of Systems  Constitutional: Negative for activity change, appetite change, chills, diaphoresis and fever.  HENT: Negative for congestion, sneezing, sore throat, trouble swallowing and voice change.   Respiratory: Negative for apnea, cough, choking, chest tightness, shortness of breath and wheezing.   Cardiovascular: Negative for chest pain, palpitations and leg swelling.  Gastrointestinal: Negative for abdominal distention, abdominal pain, constipation, diarrhea and nausea.  Genitourinary: Negative for difficulty urinating, dysuria, frequency and urgency.  Musculoskeletal: Negative for back pain, gait problem and myalgias. Arthralgias: typical arthritis.  Skin: Negative for color change, pallor, rash and wound.  Neurological: Positive for weakness. Negative for dizziness,  tremors, syncope, speech difficulty, light-headedness, numbness and headaches.  Psychiatric/Behavioral: Negative for agitation and behavioral problems.  All other systems reviewed and are negative.   Immunization History  Administered Date(s) Administered  . Influenza Split 01/23/2011, 12/16/2011  . Influenza Whole 12/28/2001, 01/03/2008, 12/11/2009  . Influenza,inj,Quad PF,36+ Mos 12/21/2012, 12/20/2013, 12/18/2014, 12/19/2015  . Pneumococcal Conjugate-13 08/10/2014  . Pneumococcal Polysaccharide-23 12/05/2015  . Td 04/30/2005   Pertinent  Health Maintenance Due  Topic Date Due  . MAMMOGRAM  12/04/2025 (Originally 12/31/2014)  . COLONOSCOPY  05/22/2017  . INFLUENZA VACCINE  Completed  . DEXA SCAN  Completed  . PNA vac Low Risk Adult  Completed   Fall Risk  12/05/2015 01/22/2015 08/10/2014 07/07/2013  Falls in the past year? No No No Yes  Number falls in past yr: - - - 1  Injury with Fall? - - - Yes   Functional Status Survey:    Vitals:   05/29/16 0530  BP: 122/73  Pulse: 73  Resp: 19  Temp: 97.7 F (36.5 C)  SpO2: 97%   There is no height or weight on file to calculate BMI. Physical Exam  Constitutional: She is oriented to person, place, and time. Vital signs are normal. She appears well-developed and well-nourished. She is active and cooperative. She does not appear ill. No distress.  HENT:  Head: Normocephalic and atraumatic.  Mouth/Throat: Uvula is midline, oropharynx is clear and moist and mucous membranes are normal. Mucous membranes are not pale, not dry and not cyanotic.  Eyes: Conjunctivae, EOM and lids are normal. Pupils are equal, round, and reactive to light.  Neck: Trachea normal, normal range of motion and full passive range of motion without pain. Neck supple. No JVD present. No tracheal deviation, no edema and no erythema present. No thyromegaly present.  Cardiovascular: Normal rate, regular rhythm, normal heart sounds, intact distal pulses and normal  pulses.  Exam reveals no gallop, no distant heart sounds and no friction rub.   No murmur heard. Pulmonary/Chest: Effort normal and breath sounds normal. No accessory muscle usage. No respiratory distress. She has no wheezes.  She has no rales. She exhibits no tenderness.  Abdominal: Normal appearance and bowel sounds are normal. She exhibits no distension and no ascites. There is no tenderness.  Musculoskeletal: Normal range of motion. She exhibits no edema or tenderness.  Expected osteoarthritis, stiffness  Neurological: She is alert and oriented to person, place, and time. She has normal strength.  Skin: Skin is warm, dry and intact. No rash noted. She is not diaphoretic. No cyanosis or erythema. No pallor. Nails show no clubbing.  Psychiatric: She has a normal mood and affect. Her speech is normal and behavior is normal. Judgment and thought content normal. Cognition and memory are normal.  Nursing note and vitals reviewed.   Labs reviewed:  Recent Labs  02/03/16 1112 05/14/16 1754 05/15/16 0605  NA 136 140 143  K 3.6 3.0* 3.8  CL 98* 99* 106  CO2 31 34* 31  GLUCOSE 81 114* 85  BUN 14 19 15   CREATININE 0.85 0.84 0.82  CALCIUM 9.5 9.3 8.6*  MG  --  1.8  --   PHOS  --  2.8  --     Recent Labs  12/05/15 1047 05/14/16 1754  AST 23 26  ALT 16 20  ALKPHOS 87 72  BILITOT 0.4 0.6  PROT 7.2 6.4*  ALBUMIN 3.8 3.4*    Recent Labs  12/05/15 1047 02/03/16 1112 05/14/16 1754 05/15/16 0605  WBC 12.4* 11.7* 10.5 7.5  NEUTROABS 9.7*  --  7.2*  --   HGB 14.1 14.4 13.3 12.7  HCT 42.5 42.5 37.8 38.4  MCV 89.5 87.2 87.8 89.8  PLT 367.0 259 227 196   Lab Results  Component Value Date   TSH 0.223 (L) 05/27/2013   Lab Results  Component Value Date   HGBA1C 5.2 04/21/2011   Lab Results  Component Value Date   CHOL 103 12/05/2015   HDL 35.90 (L) 12/05/2015   LDLCALC 31 12/05/2015   TRIG 177.0 (H) 12/05/2015   CHOLHDL 3 12/05/2015    Significant Diagnostic Results in  last 30 days:  Dg Chest 2 View  Result Date: 05/14/2016 CLINICAL DATA:  76 y/o F; weakness and shortness of breath, and wheezing. EXAM: CHEST  2 VIEW COMPARISON:  02/03/2016 chest radiograph. FINDINGS: Stable cardiac silhouette within normal limits given projection and technique. Aortic atherosclerosis with calcification. Emphysema with upper lobe predominance. Bronchitic changes in the lung bases. No focal consolidation. No pleural effusion. No pneumothorax. Demineralized bones. No acute osseous abnormality is identified. IMPRESSION: Emphysema. Aortic atherosclerosis. Lung base bronchitic changes. No focal consolidation. Electronically Signed   By: Kristine Garbe M.D.   On: 05/14/2016 22:05   Ct Head Wo Contrast  Result Date: 05/14/2016 CLINICAL DATA:  Dizziness and nausea. EXAM: CT HEAD WITHOUT CONTRAST TECHNIQUE: Contiguous axial images were obtained from the base of the skull through the vertex without intravenous contrast. COMPARISON:  08/02/2009 FINDINGS: Brain: There is no evidence of acute cortical infarct, intracranial hemorrhage, mass, midline shift, or extra-axial fluid collection. Mild cerebral atrophy is unchanged. Periventricular white matter hypodensities are nonspecific but compatible with mild chronic small vessel ischemic disease. Vascular: Calcified atherosclerosis at the skullbase. No hyperdense vessel. Skull: No fracture or focal osseous lesion. Sinuses/Orbits: No significant sinus disease. Prior bilateral cataract extraction. Other: None. IMPRESSION: 1. No evidence of acute intracranial abnormality. 2. Mild chronic small vessel ischemic disease. Electronically Signed   By: Logan Bores M.D.   On: 05/14/2016 18:21    Assessment/Plan 1. Vertigo  Continue Cymbalta 30 mg po Q  Day  Continue Alprazolam to 0.25 mg po TID  Meclizine 25 mg po TID PRN- dizziness  Follow up with PCP asap after discharge for continuity of care  2. Anxiety and depression  Cymbalta 30 mg po Q  day  Alprazolam 0.25 mg PO TID- anxiety- RX written for # 15 (fifteen), no refills  Follow up with PCP asap after discharge for continuity of care and medication management  Family/ staff Communication:   Total Time:  Documentation:  Face to Face:  Family/Phone:   Labs/tests ordered:    Patient is being discharged with the following home health services:  [facility] PT/OT/Nsg through [facility]  Patient is being discharged with the following durable medical equipment:  none  Patient has been advised to f/u with their PCP in 1-2 weeks to bring them up to date on their rehab stay.  Social services at facility was responsible for arranging this appointment.  Pt was provided with a 30 day supply of prescriptions for medications and refills must be obtained from their PCP.  For controlled substances, a more limited supply may be provided adequate until PCP appointment only.  Medication list reviewed and assessed for continued appropriateness.  Vikki Ports, NP-C Geriatrics [facility] [phone], [place] [address] Cell Phone (Mon-Fri 8am-5pm):  [phone] On Call:  [phone] & follow prompts after 5pm & weekends Office Phone:  [phone] Office Fax:  [phone]

## 2016-06-05 NOTE — Progress Notes (Signed)
Location:      Place of Service:  SNF (31) Provider:  Toni Arthurs, NP-C  Elsie Stain, MD  Patient Care Team: Tonia Ghent, MD as PCP - General (Family Medicine) Grace Isaac, MD as Consulting Physician (Cardiothoracic Surgery) Nestor Lewandowsky, MD as Referring Physician (Thoracic Diseases) Erby Pian, MD as Referring Physician Erby Pian, MD as Referring Physician  Extended Emergency Contact Information Primary Emergency Contact: Wiland,Richard Address: [address]          [place], [place] [address] Johnnette Litter of [place] Phone: [phone] Relation: Spouse Secondary Emergency Contact: Geralyn Corwin Address: [place], [place] [address] Johnnette Litter of [place] Phone: [phone] Mobile Phone: [phone] Relation: Sister  Code Status:  full Goals of care: Advanced Directive information Advanced Directives 05/15/2016  Does Patient Have a Medical Advance Directive? No  Type of Advance Directive -  Does patient want to make changes to medical advance directive? -  Copy of [place] in Chart? -  Would patient like information on creating a medical advance directive? No - Patient declined  Pre-existing out of facility DNR order (yellow form or pink MOST form) -     Chief Complaint  Patient presents with  . Acute Visit    HPI:  Pt is a 76 y.o. female seen today for an acute visit for dizziness and vertigo. Pt reports the "room is spinning" even when she is lying down and her eyes are closed. She endorses that if she turns her head quickly, she becomes dizzy. Pt reports she is eating well at home, drinking well. No chest pain or shortness of breath. After a great deal of discussion about the dizziness/ symptoms wel discussed any recent changes in her health or medications. She reports she has been on alprazolam for her anxiety for a while and had not been taking the effexor "very long." She then reports she  has "felt this way" ever since she started taking this medication. We then discussed changing her medications, she agrees to this plan "if I think it might help." Pt is scheduled to discharge tomorrow per family request. I will speak with SW to discuss extending pt's stay to monitor progress and safety. VSS. No other complaints.    Past Medical History:  Diagnosis Date  . Anginal pain [facility])    see dr [name]  "Spams"  . Anxiety   . Arthritis   . Cancer (HCC)    side of head- skin cancer, squamous cell ca on left foot.  . Complication of anesthesia   . Constipation   . COPD (chronic obstructive pulmonary disease) ([place])   . COPD, severe 07/29/98  . Coronary artery disease 01/22/99   Non Q-wave MI, stent RCA  . Depression   . Depression   . Dysrhythmia    Palpations at times. last time 10/29 ish 2014  . Esophageal dilatation    Pt needs appple sauce to take meds.  . Esophageal stricture    PT needs apple sauce to take meds  . GERD (gastroesophageal reflux disease) 08/29/98   severe-no meds now  . Head injury, acute, with loss of consciousness ([place]) 11/2012   unsure how long  . History of blood transfusion   . History of blurry vision 05/06-05/10/2009   Hospital [facility] CP R/O'D Blurry vision, smoking  . History of CT scan of head 08/02/09   w/o mild age appropr atrophy  .  History of ETT 08/03/09   Myoview Normal  . History of ETT 05/1999   Cardiolite pos ETT, neg Cardiolite  . History of ETT 09/02/01   Normal  . History of ETT 04/28/2006   Myoview normal EF 83%  . History of ETT 04/10/11   normal EF and no ischemia per Dr. Saralyn Pilar  . Hx of cardiac catheterization 08/29/01   60% RCA 0/w 20-30% lesions  . Hx of cardiac catheterization 01/22/99   w/Stent 90% RCA lesion  . Hypertension    03/30/00  . Mental disorder   . On home oxygen therapy    as needed-has a travel tank  . Pneumothorax 2/14  . PONV (postoperative nausea and vomiting)    ad breast remocved and see  was under anesthesia a long time  . Shortness of breath   . Status post aortic coarctation stent placement    Past Surgical History:  Procedure Laterality Date  . ABDOMINAL HYSTERECTOMY    . BACK SURGERY    . BREAST SURGERY     implant removal, R breast  . CAROTID STENT Right 2000  . CATARACT EXTRACTION Bilateral    2016  . COLONOSCOPY    . ESOPHAGEAL DILATION  06/00   EGD  . LAMINECTOMY  1976   Disc Removal X 2 Lumbar  . MASTECTOMY  03/1976   Bilateral due FCBD with implants [facility])  . OOPHORECTOMY    . TISSUE EXPANDER PLACEMENT Right 02/03/2013   Procedure: REMOVAL OF RIGHT BREAST IMPLANT AND IMPLANT MATERIAL  CAPSULECTOMY;  Surgeon: Irene Limbo, MD;  Location: [place];  Service: Plastics;  Laterality: Right;    Allergies  Allergen Reactions  . Amoxicillin Shortness Of Breath  . Doxycycline Nausea Only and Other (See Comments)    Dizzy Reaction:  Dizziness   . Sertraline Hcl Nausea And Vomiting    REACTION: trembling    Allergies as of 05/26/2016      Reactions   Amoxicillin Shortness Of Breath   Doxycycline Nausea Only, Other (See Comments)   Dizzy Reaction:  Dizziness    Sertraline Hcl Nausea And Vomiting   REACTION: trembling      Medication List       Accurate as of 05/26/16 11:59 PM. Always use your most recent med list.          albuterol 108 (90 Base) MCG/ACT inhaler Commonly known as:  PROAIR HFA INHALE TWO PUFFS BY MOUTH EVERY 6 HOURS AS NEEDED   ALPRAZolam 0.5 MG tablet Commonly known as:  XANAX Take 1 tablet (0.5 mg total) by mouth 3 (three) times daily as needed.   aspirin EC 81 MG tablet Take 81 mg by mouth daily.   atorvastatin 20 MG tablet Commonly known as:  LIPITOR TAKE 1 TABLET BY MOUTH ONCE A DAY   budesonide-formoterol 160-4.5 MCG/ACT inhaler Commonly known as:  SYMBICORT Inhale 2 puffs into the lungs 2 (two) times daily.   clopidogrel 75 MG tablet Commonly known as:  PLAVIX TAKE 1 TABLET BY MOUTH ONCE A DAY     furosemide 20 MG tablet Commonly known as:  LASIX Take 20 mg by mouth daily.   meclizine 25 MG tablet Commonly known as:  ANTIVERT Take 1 tablet (25 mg total) by mouth 3 (three) times daily as needed for dizziness.   metoprolol succinate 50 MG 24 hr tablet Commonly known as:  TOPROL-XL Take 1 tablet by mouth daily.   nitroGLYCERIN 0.4 MG SL tablet Commonly known as:  NITROSTAT Place 1  tablet (0.4 mg total) under the tongue every 5 (five) minutes as needed for chest pain (max 3 doses in 15 min).   polyethylene glycol powder powder Commonly known as:  GLYCOLAX/MIRALAX Take by mouth.   SPIRIVA HANDIHALER 18 MCG inhalation capsule Generic drug:  tiotropium INHALE ONE DOSE ONCE DAILY   venlafaxine 37.5 MG tablet Commonly known as:  EFFEXOR 2 tabs in the AM, 1 tab in the PM.       Review of Systems  Constitutional: Negative for activity change, appetite change, chills, diaphoresis and fever.  HENT: Negative for congestion, sneezing, sore throat, trouble swallowing and voice change.   Respiratory: Negative for apnea, cough, choking, chest tightness, shortness of breath and wheezing.   Cardiovascular: Negative for chest pain, palpitations and leg swelling.  Gastrointestinal: Negative for abdominal distention, abdominal pain, constipation, diarrhea and nausea.  Genitourinary: Negative for difficulty urinating, dysuria, frequency and urgency.  Musculoskeletal: Negative for back pain, gait problem and myalgias. Arthralgias: typical arthritis.  Skin: Negative for color change, pallor, rash and wound.  Neurological: Positive for dizziness, syncope, weakness and light-headedness. Negative for tremors, speech difficulty, numbness and headaches.  Psychiatric/Behavioral: Negative for agitation and behavioral problems.  All other systems reviewed and are negative.   Immunization History  Administered Date(s) Administered  . Influenza Split 01/23/2011, 12/16/2011  . Influenza Whole  12/28/2001, 01/03/2008, 12/11/2009  . Influenza,inj,Quad PF,36+ Mos 12/21/2012, 12/20/2013, 12/18/2014, 12/19/2015  . Pneumococcal Conjugate-13 08/10/2014  . Pneumococcal Polysaccharide-23 12/05/2015  . Td 04/30/2005   Pertinent  Health Maintenance Due  Topic Date Due  . MAMMOGRAM  12/04/2025 (Originally 12/31/2014)  . COLONOSCOPY  05/22/2017  . INFLUENZA VACCINE  Completed  . DEXA SCAN  Completed  . PNA vac Low Risk Adult  Completed   Fall Risk  12/05/2015 01/22/2015 08/10/2014 07/07/2013  Falls in the past year? No No No Yes  Number falls in past yr: - - - 1  Injury with Fall? - - - Yes   Functional Status Survey:    Vitals:   05/26/16 0430  BP: (!) 130/48  Pulse: 78  Resp: 20  Temp: 97.7 F (36.5 C)  SpO2: 97%  Weight: 156 lb 11.2 oz (71.1 kg)   Body mass index is 24.54 kg/m. Physical Exam  Constitutional: She is oriented to person, place, and time. Vital signs are normal. She appears well-developed and well-nourished. She is active and cooperative. She does not appear ill. No distress.  HENT:  Head: Normocephalic and atraumatic.  Mouth/Throat: Uvula is midline, oropharynx is clear and moist and mucous membranes are normal. Mucous membranes are not pale, not dry and not cyanotic.  Eyes: Conjunctivae, EOM and lids are normal. Pupils are equal, round, and reactive to light.  Neck: Trachea normal, normal range of motion and full passive range of motion without pain. Neck supple. No JVD present. No tracheal deviation, no edema and no erythema present. No thyromegaly present.  Cardiovascular: Normal rate, regular rhythm, normal heart sounds, intact distal pulses and normal pulses.  Exam reveals no gallop, no distant heart sounds and no friction rub.   No murmur heard. Pulmonary/Chest: Effort normal and breath sounds normal. No accessory muscle usage. No respiratory distress. She has no wheezes. She has no rales. She exhibits no tenderness.  Abdominal: Normal appearance and bowel  sounds are normal. She exhibits no distension and no ascites. There is no tenderness.  Musculoskeletal: Normal range of motion. She exhibits no edema or tenderness.  Expected osteoarthritis, stiffness  Neurological: She is  alert and oriented to person, place, and time. She has normal strength.  Skin: Skin is warm, dry and intact. No rash noted. She is not diaphoretic. No cyanosis or erythema. No pallor. Nails show no clubbing.  Psychiatric: She has a normal mood and affect. Her speech is normal and behavior is normal. Judgment and thought content normal. Cognition and memory are normal.  Nursing note and vitals reviewed.   Labs reviewed:  Recent Labs  02/03/16 1112 05/14/16 1754 05/15/16 0605  NA 136 140 143  K 3.6 3.0* 3.8  CL 98* 99* 106  CO2 31 34* 31  GLUCOSE 81 114* 85  BUN 14 19 15   CREATININE 0.85 0.84 0.82  CALCIUM 9.5 9.3 8.6*  MG  --  1.8  --   PHOS  --  2.8  --     Recent Labs  12/05/15 1047 05/14/16 1754  AST 23 26  ALT 16 20  ALKPHOS 87 72  BILITOT 0.4 0.6  PROT 7.2 6.4*  ALBUMIN 3.8 3.4*    Recent Labs  12/05/15 1047 02/03/16 1112 05/14/16 1754 05/15/16 0605  WBC 12.4* 11.7* 10.5 7.5  NEUTROABS 9.7*  --  7.2*  --   HGB 14.1 14.4 13.3 12.7  HCT 42.5 42.5 37.8 38.4  MCV 89.5 87.2 87.8 89.8  PLT 367.0 259 227 196   Lab Results  Component Value Date   TSH 0.223 (L) 05/27/2013   Lab Results  Component Value Date   HGBA1C 5.2 04/21/2011   Lab Results  Component Value Date   CHOL 103 12/05/2015   HDL 35.90 (L) 12/05/2015   LDLCALC 31 12/05/2015   TRIG 177.0 (H) 12/05/2015   CHOLHDL 3 12/05/2015    Significant Diagnostic Results in last 30 days:  Dg Chest 2 View  Result Date: 05/14/2016 CLINICAL DATA:  76 y/o F; weakness and shortness of breath, and wheezing. EXAM: CHEST  2 VIEW COMPARISON:  02/03/2016 chest radiograph. FINDINGS: Stable cardiac silhouette within normal limits given projection and technique. Aortic atherosclerosis with  calcification. Emphysema with upper lobe predominance. Bronchitic changes in the lung bases. No focal consolidation. No pleural effusion. No pneumothorax. Demineralized bones. No acute osseous abnormality is identified. IMPRESSION: Emphysema. Aortic atherosclerosis. Lung base bronchitic changes. No focal consolidation. Electronically Signed   By: Kristine Garbe M.D.   On: 05/14/2016 22:05   Ct Head Wo Contrast  Result Date: 05/14/2016 CLINICAL DATA:  Dizziness and nausea. EXAM: CT HEAD WITHOUT CONTRAST TECHNIQUE: Contiguous axial images were obtained from the base of the skull through the vertex without intravenous contrast. COMPARISON:  08/02/2009 FINDINGS: Brain: There is no evidence of acute cortical infarct, intracranial hemorrhage, mass, midline shift, or extra-axial fluid collection. Mild cerebral atrophy is unchanged. Periventricular white matter hypodensities are nonspecific but compatible with mild chronic small vessel ischemic disease. Vascular: Calcified atherosclerosis at the skullbase. No hyperdense vessel. Skull: No fracture or focal osseous lesion. Sinuses/Orbits: No significant sinus disease. Prior bilateral cataract extraction. Other: None. IMPRESSION: 1. No evidence of acute intracranial abnormality. 2. Mild chronic small vessel ischemic disease. Electronically Signed   By: Logan Bores M.D.   On: 05/14/2016 18:21    Assessment/Plan 1. Vertigo  DC Effexor  Start Cymbalta 30 mg po Q Day  Decrease Alprazolam to 0.25 mg po TID  Meclizine 25 mg po TID PRN- dizziness  2. Anxiety and depression  Cymbalta 30 mg po Q day  Alprazolam 0.25 mg PO TID- anxiety  Family/ staff Communication:   Total Time:  Documentation:  Face to Face:  Family/Phone:   Labs/tests ordered:    Medication list reviewed and assessed for continued appropriateness.  Vikki Ports, NP-C [facility] [phone], [place]  [address] Cell Phone (Mon-Fri 8am-5pm):  [phone] On Call:  [phone] & follow prompts after 5pm & weekends Office Phone:  [phone] Office Fax:  [phone]

## 2016-06-09 DIAGNOSIS — Z955 Presence of coronary angioplasty implant and graft: Secondary | ICD-10-CM | POA: Diagnosis not present

## 2016-06-09 DIAGNOSIS — Z7982 Long term (current) use of aspirin: Secondary | ICD-10-CM | POA: Diagnosis not present

## 2016-06-09 DIAGNOSIS — R42 Dizziness and giddiness: Secondary | ICD-10-CM | POA: Diagnosis not present

## 2016-06-09 DIAGNOSIS — Z7902 Long term (current) use of antithrombotics/antiplatelets: Secondary | ICD-10-CM | POA: Diagnosis not present

## 2016-06-09 DIAGNOSIS — I2510 Atherosclerotic heart disease of native coronary artery without angina pectoris: Secondary | ICD-10-CM | POA: Diagnosis not present

## 2016-06-09 DIAGNOSIS — I10 Essential (primary) hypertension: Secondary | ICD-10-CM | POA: Diagnosis not present

## 2016-06-09 DIAGNOSIS — J441 Chronic obstructive pulmonary disease with (acute) exacerbation: Secondary | ICD-10-CM | POA: Diagnosis not present

## 2016-06-09 DIAGNOSIS — M1990 Unspecified osteoarthritis, unspecified site: Secondary | ICD-10-CM | POA: Diagnosis not present

## 2016-06-09 DIAGNOSIS — Z9981 Dependence on supplemental oxygen: Secondary | ICD-10-CM | POA: Diagnosis not present

## 2016-06-09 DIAGNOSIS — E538 Deficiency of other specified B group vitamins: Secondary | ICD-10-CM | POA: Diagnosis not present

## 2016-06-11 DIAGNOSIS — Z7902 Long term (current) use of antithrombotics/antiplatelets: Secondary | ICD-10-CM | POA: Diagnosis not present

## 2016-06-11 DIAGNOSIS — Z955 Presence of coronary angioplasty implant and graft: Secondary | ICD-10-CM | POA: Diagnosis not present

## 2016-06-11 DIAGNOSIS — E538 Deficiency of other specified B group vitamins: Secondary | ICD-10-CM | POA: Diagnosis not present

## 2016-06-11 DIAGNOSIS — R42 Dizziness and giddiness: Secondary | ICD-10-CM | POA: Diagnosis not present

## 2016-06-11 DIAGNOSIS — I10 Essential (primary) hypertension: Secondary | ICD-10-CM | POA: Diagnosis not present

## 2016-06-11 DIAGNOSIS — J441 Chronic obstructive pulmonary disease with (acute) exacerbation: Secondary | ICD-10-CM | POA: Diagnosis not present

## 2016-06-11 DIAGNOSIS — Z9981 Dependence on supplemental oxygen: Secondary | ICD-10-CM | POA: Diagnosis not present

## 2016-06-11 DIAGNOSIS — M1990 Unspecified osteoarthritis, unspecified site: Secondary | ICD-10-CM | POA: Diagnosis not present

## 2016-06-11 DIAGNOSIS — I2510 Atherosclerotic heart disease of native coronary artery without angina pectoris: Secondary | ICD-10-CM | POA: Diagnosis not present

## 2016-06-11 DIAGNOSIS — Z7982 Long term (current) use of aspirin: Secondary | ICD-10-CM | POA: Diagnosis not present

## 2016-06-12 ENCOUNTER — Telehealth: Payer: Self-pay

## 2016-06-12 ENCOUNTER — Ambulatory Visit: Payer: Medicare Other | Admitting: Urology

## 2016-06-12 NOTE — Telephone Encounter (Signed)
Please give the order.  Thanks.   

## 2016-06-12 NOTE — Telephone Encounter (Signed)
Mckenzie Mclaughlin with [facility]  Calling for verbal order for OT 2 times weekly for 6 weeks---please advise

## 2016-06-12 NOTE — Telephone Encounter (Signed)
Verbal order given to Connie 

## 2016-06-13 DIAGNOSIS — I509 Heart failure, unspecified: Secondary | ICD-10-CM | POA: Diagnosis not present

## 2016-06-13 DIAGNOSIS — J449 Chronic obstructive pulmonary disease, unspecified: Secondary | ICD-10-CM | POA: Diagnosis not present

## 2016-06-13 DIAGNOSIS — G4733 Obstructive sleep apnea (adult) (pediatric): Secondary | ICD-10-CM | POA: Diagnosis not present

## 2016-06-15 ENCOUNTER — Ambulatory Visit
Admission: RE | Admit: 2016-06-15 | Discharge: 2016-06-15 | Disposition: A | Discharge status: Home / Self Care | Payer: Medicare Other | Source: Ambulatory Visit | Attending: Specialist | Admitting: Specialist

## 2016-06-15 DIAGNOSIS — R918 Other nonspecific abnormal finding of lung field: Secondary | ICD-10-CM | POA: Diagnosis not present

## 2016-06-15 DIAGNOSIS — R911 Solitary pulmonary nodule: Secondary | ICD-10-CM

## 2016-06-15 DIAGNOSIS — I708 Atherosclerosis of other arteries: Secondary | ICD-10-CM | POA: Insufficient documentation

## 2016-06-15 DIAGNOSIS — I700 Atherosclerosis of aorta: Secondary | ICD-10-CM | POA: Insufficient documentation

## 2016-06-15 DIAGNOSIS — J439 Emphysema, unspecified: Secondary | ICD-10-CM | POA: Diagnosis not present

## 2016-06-16 DIAGNOSIS — I10 Essential (primary) hypertension: Secondary | ICD-10-CM | POA: Diagnosis not present

## 2016-06-16 DIAGNOSIS — E538 Deficiency of other specified B group vitamins: Secondary | ICD-10-CM | POA: Diagnosis not present

## 2016-06-16 DIAGNOSIS — Z955 Presence of coronary angioplasty implant and graft: Secondary | ICD-10-CM | POA: Diagnosis not present

## 2016-06-16 DIAGNOSIS — Z7982 Long term (current) use of aspirin: Secondary | ICD-10-CM | POA: Diagnosis not present

## 2016-06-16 DIAGNOSIS — J441 Chronic obstructive pulmonary disease with (acute) exacerbation: Secondary | ICD-10-CM | POA: Diagnosis not present

## 2016-06-16 DIAGNOSIS — I2510 Atherosclerotic heart disease of native coronary artery without angina pectoris: Secondary | ICD-10-CM | POA: Diagnosis not present

## 2016-06-16 DIAGNOSIS — Z9981 Dependence on supplemental oxygen: Secondary | ICD-10-CM | POA: Diagnosis not present

## 2016-06-16 DIAGNOSIS — Z7902 Long term (current) use of antithrombotics/antiplatelets: Secondary | ICD-10-CM | POA: Diagnosis not present

## 2016-06-16 DIAGNOSIS — R42 Dizziness and giddiness: Secondary | ICD-10-CM | POA: Diagnosis not present

## 2016-06-16 DIAGNOSIS — M1990 Unspecified osteoarthritis, unspecified site: Secondary | ICD-10-CM | POA: Diagnosis not present

## 2016-06-18 DIAGNOSIS — Z7902 Long term (current) use of antithrombotics/antiplatelets: Secondary | ICD-10-CM | POA: Diagnosis not present

## 2016-06-18 DIAGNOSIS — E538 Deficiency of other specified B group vitamins: Secondary | ICD-10-CM | POA: Diagnosis not present

## 2016-06-18 DIAGNOSIS — Z7982 Long term (current) use of aspirin: Secondary | ICD-10-CM | POA: Diagnosis not present

## 2016-06-18 DIAGNOSIS — Z9981 Dependence on supplemental oxygen: Secondary | ICD-10-CM | POA: Diagnosis not present

## 2016-06-18 DIAGNOSIS — I10 Essential (primary) hypertension: Secondary | ICD-10-CM | POA: Diagnosis not present

## 2016-06-18 DIAGNOSIS — R42 Dizziness and giddiness: Secondary | ICD-10-CM | POA: Diagnosis not present

## 2016-06-18 DIAGNOSIS — J441 Chronic obstructive pulmonary disease with (acute) exacerbation: Secondary | ICD-10-CM | POA: Diagnosis not present

## 2016-06-18 DIAGNOSIS — M1990 Unspecified osteoarthritis, unspecified site: Secondary | ICD-10-CM | POA: Diagnosis not present

## 2016-06-18 DIAGNOSIS — Z955 Presence of coronary angioplasty implant and graft: Secondary | ICD-10-CM | POA: Diagnosis not present

## 2016-06-18 DIAGNOSIS — I2510 Atherosclerotic heart disease of native coronary artery without angina pectoris: Secondary | ICD-10-CM | POA: Diagnosis not present

## 2016-06-19 DIAGNOSIS — I2510 Atherosclerotic heart disease of native coronary artery without angina pectoris: Secondary | ICD-10-CM | POA: Diagnosis not present

## 2016-06-19 DIAGNOSIS — J441 Chronic obstructive pulmonary disease with (acute) exacerbation: Secondary | ICD-10-CM | POA: Diagnosis not present

## 2016-06-19 DIAGNOSIS — M1990 Unspecified osteoarthritis, unspecified site: Secondary | ICD-10-CM | POA: Diagnosis not present

## 2016-06-19 DIAGNOSIS — E538 Deficiency of other specified B group vitamins: Secondary | ICD-10-CM | POA: Diagnosis not present

## 2016-06-19 DIAGNOSIS — Z7902 Long term (current) use of antithrombotics/antiplatelets: Secondary | ICD-10-CM | POA: Diagnosis not present

## 2016-06-19 DIAGNOSIS — Z7982 Long term (current) use of aspirin: Secondary | ICD-10-CM | POA: Diagnosis not present

## 2016-06-19 DIAGNOSIS — R42 Dizziness and giddiness: Secondary | ICD-10-CM | POA: Diagnosis not present

## 2016-06-19 DIAGNOSIS — Z9981 Dependence on supplemental oxygen: Secondary | ICD-10-CM | POA: Diagnosis not present

## 2016-06-19 DIAGNOSIS — I10 Essential (primary) hypertension: Secondary | ICD-10-CM | POA: Diagnosis not present

## 2016-06-19 DIAGNOSIS — Z955 Presence of coronary angioplasty implant and graft: Secondary | ICD-10-CM | POA: Diagnosis not present

## 2016-06-22 DIAGNOSIS — M1990 Unspecified osteoarthritis, unspecified site: Secondary | ICD-10-CM | POA: Diagnosis not present

## 2016-06-22 DIAGNOSIS — J441 Chronic obstructive pulmonary disease with (acute) exacerbation: Secondary | ICD-10-CM | POA: Diagnosis not present

## 2016-06-22 DIAGNOSIS — Z7902 Long term (current) use of antithrombotics/antiplatelets: Secondary | ICD-10-CM | POA: Diagnosis not present

## 2016-06-22 DIAGNOSIS — Z955 Presence of coronary angioplasty implant and graft: Secondary | ICD-10-CM | POA: Diagnosis not present

## 2016-06-22 DIAGNOSIS — Z7982 Long term (current) use of aspirin: Secondary | ICD-10-CM | POA: Diagnosis not present

## 2016-06-22 DIAGNOSIS — G4733 Obstructive sleep apnea (adult) (pediatric): Secondary | ICD-10-CM | POA: Diagnosis not present

## 2016-06-22 DIAGNOSIS — E538 Deficiency of other specified B group vitamins: Secondary | ICD-10-CM | POA: Diagnosis not present

## 2016-06-22 DIAGNOSIS — R42 Dizziness and giddiness: Secondary | ICD-10-CM | POA: Diagnosis not present

## 2016-06-22 DIAGNOSIS — I509 Heart failure, unspecified: Secondary | ICD-10-CM | POA: Diagnosis not present

## 2016-06-22 DIAGNOSIS — J449 Chronic obstructive pulmonary disease, unspecified: Secondary | ICD-10-CM | POA: Diagnosis not present

## 2016-06-22 DIAGNOSIS — Z9981 Dependence on supplemental oxygen: Secondary | ICD-10-CM | POA: Diagnosis not present

## 2016-06-22 DIAGNOSIS — I10 Essential (primary) hypertension: Secondary | ICD-10-CM | POA: Diagnosis not present

## 2016-06-22 DIAGNOSIS — I2510 Atherosclerotic heart disease of native coronary artery without angina pectoris: Secondary | ICD-10-CM | POA: Diagnosis not present

## 2016-06-22 IMAGING — CT CT ABD-PEL WO/W CM
2 of 4 series · 12 of 32 positions shown, 17 images · IV contrast (omnipaque)
Comparison: CT the abdomen and pelvis 11/12/2014.

CLINICAL DATA: 74-year-old female with history of gross hematuria
for the past 2 months. Burning during urination. History of kidney
stones.

EXAM:
CT ABDOMEN AND PELVIS WITHOUT AND WITH CONTRAST
TECHNIQUE: Multidetector CT imaging of the abdomen and pelvis was performed
following the standard protocol before and following the bolus
administration of intravenous contrast.
CONTRAST:  125mL OMNIPAQUE IOHEXOL 300 MG/ML  SOLN

[Series 2: hematuria > 45 wo · axial · 0.72mm/px · z∈[-1074,-759]mm · 8 of 82 slices shown, 13 images]
[im 10/82  soft-tissue]
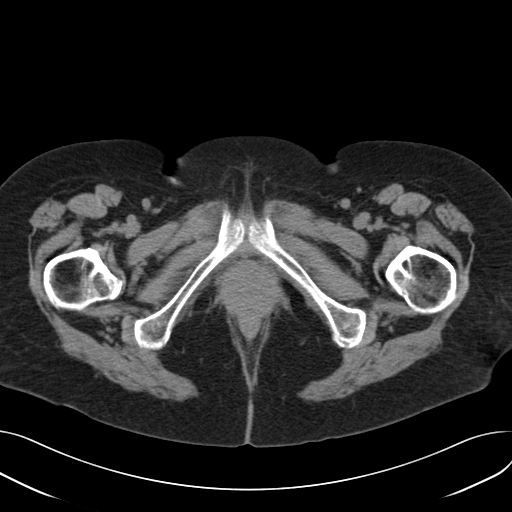
[im 10/82  bone]
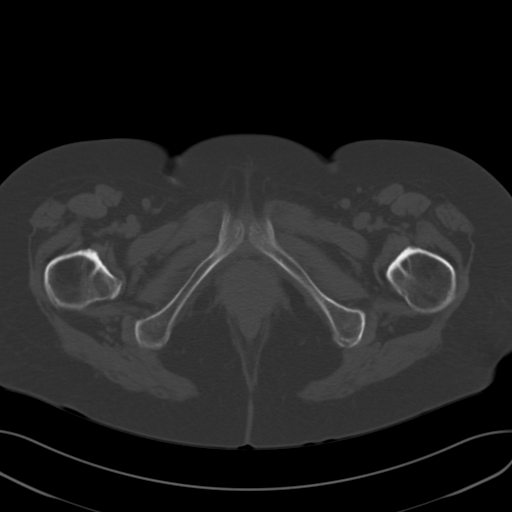
[im 19/82  soft-tissue]
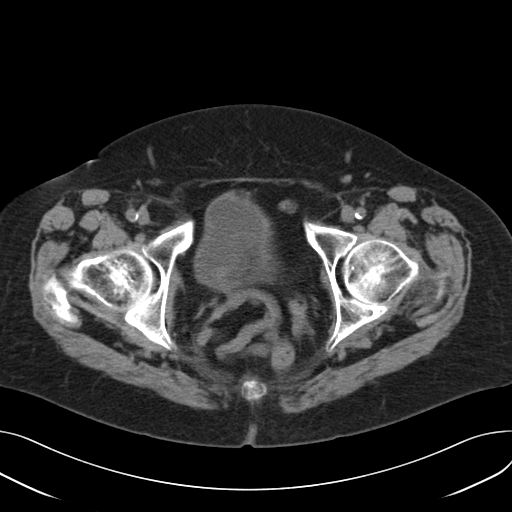
[im 28/82  soft-tissue]
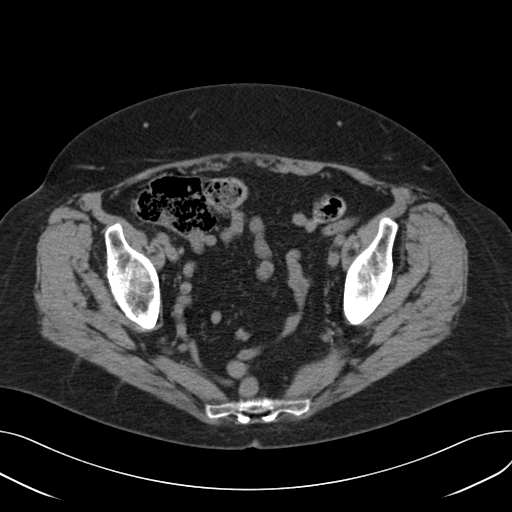
[im 37/82  soft-tissue]
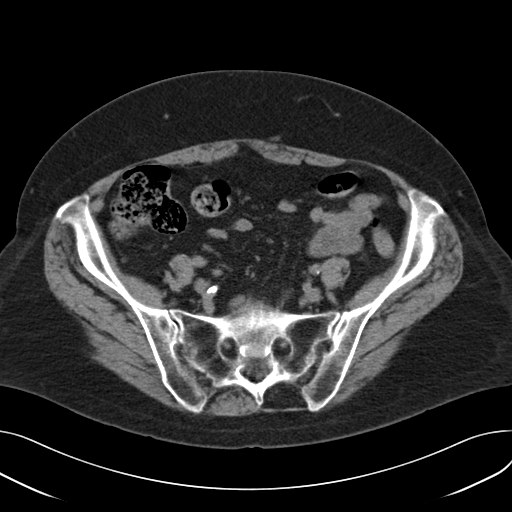
[im 46/82  soft-tissue]
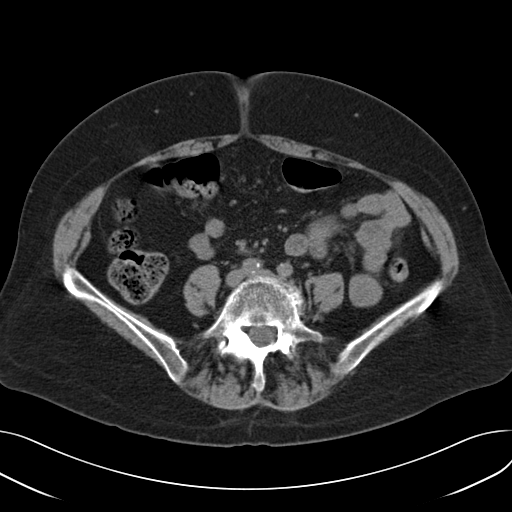
[im 46/82  lung]
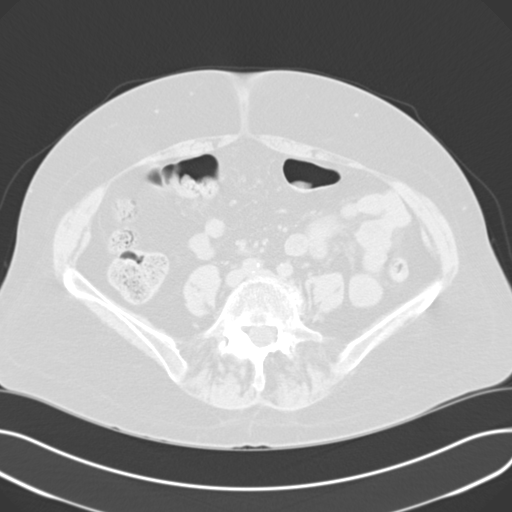
[im 55/82  soft-tissue]
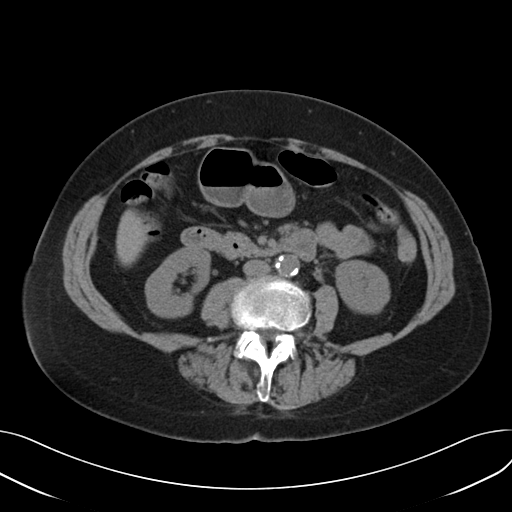
[im 55/82  lung]
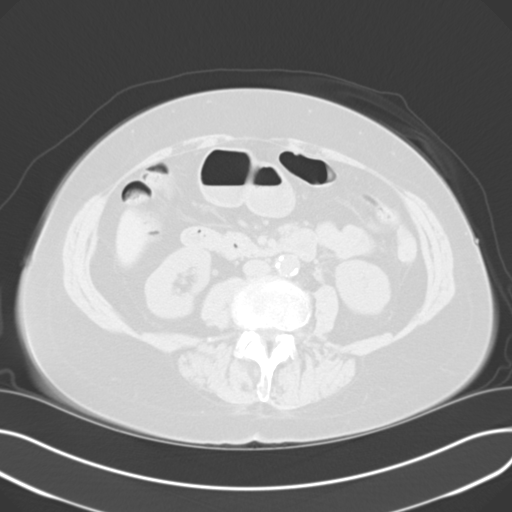
[im 64/82  soft-tissue]
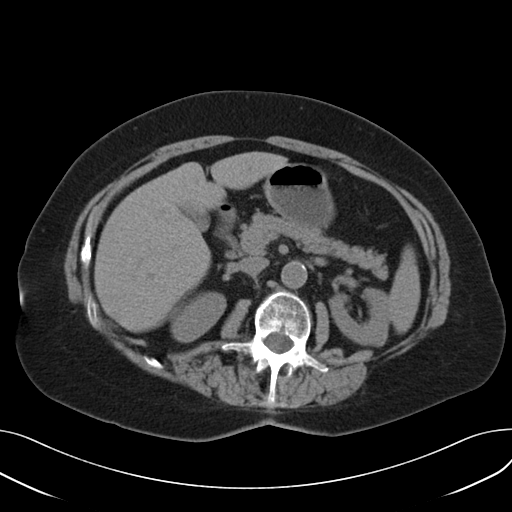
[im 64/82  lung]
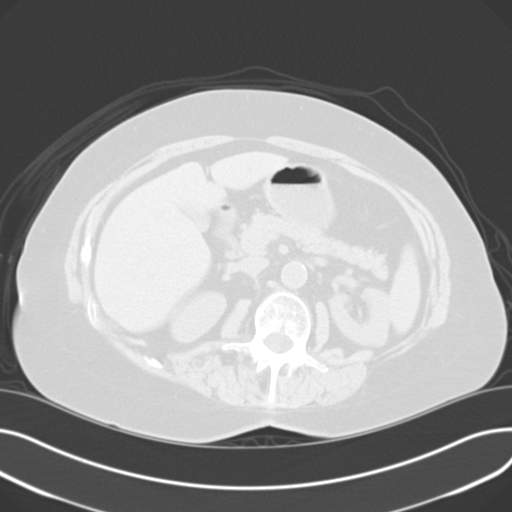
[im 73/82  soft-tissue]
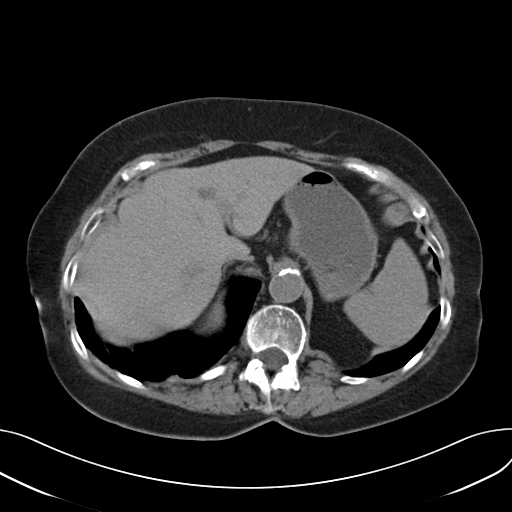
[im 73/82  lung]
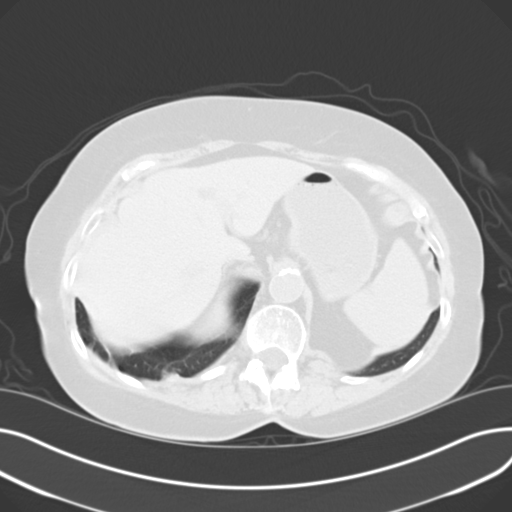

[Series 4: hematuria > 45 post · axial · 0.72mm/px · z∈[-1074,-939]mm · 4 of 82 slices shown]
[im 10/82  soft-tissue]
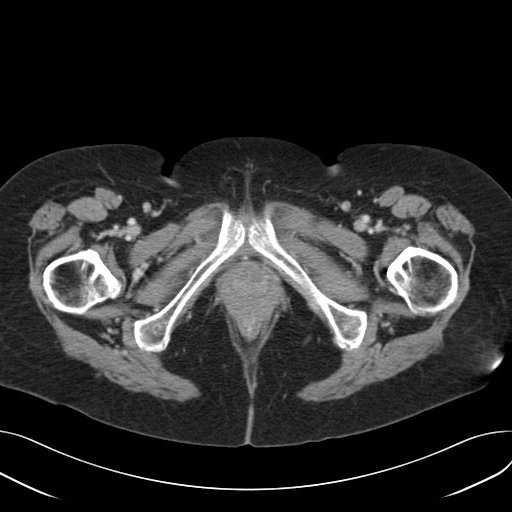
[im 19/82  soft-tissue]
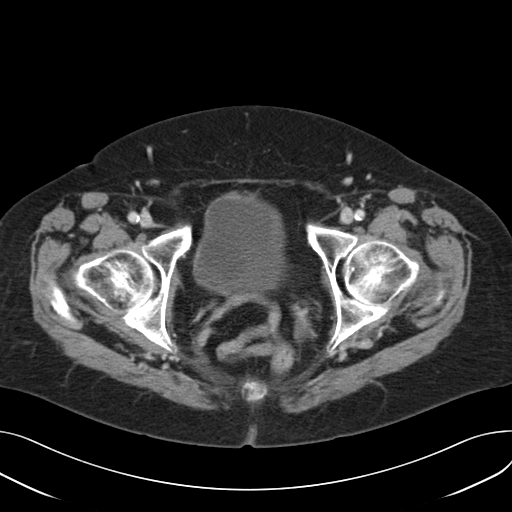
[im 28/82  soft-tissue]
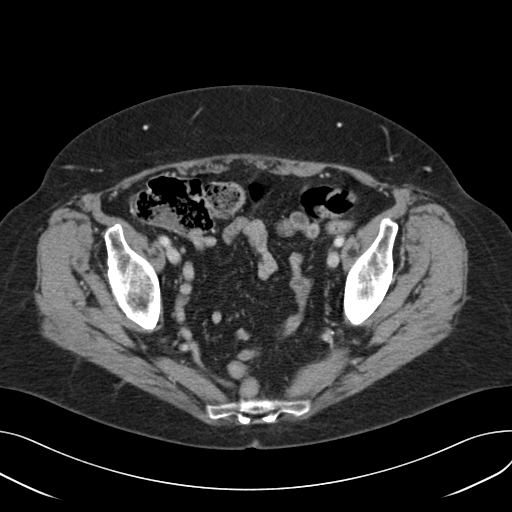
[im 37/82  soft-tissue]
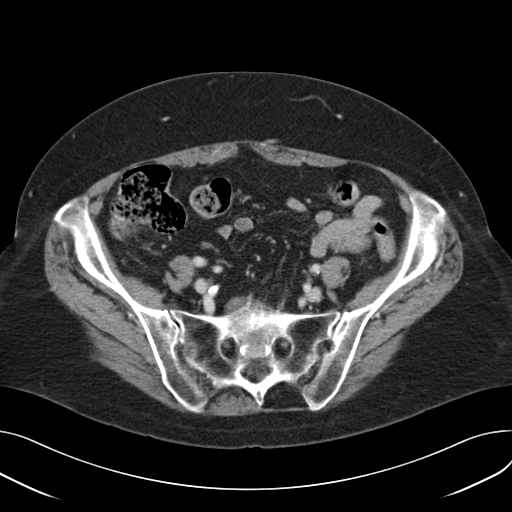

[12 of 32 positions shown; findings below may reference images not displayed]

FINDINGS: Lower chest: Mild scarring in the right lung base. Atherosclerotic
calcifications in the right coronary artery.

Hepatobiliary: Multiple small low-attenuation liver lesions,
unchanged in size, number and distribution compared to prior
studies. The largest of these measures 1.5 cm and is compatible with
a simple cyst. The smaller lesions are too small to characterize,
but also likely represent tiny benign lesions such as cysts. No
suspicious hepatic lesions. No intra or extrahepatic biliary ductal
dilatation. Gallbladder is unremarkable in appearance.

Pancreas: No pancreatic mass. No pancreatic ductal dilatation. No
pancreatic or peripancreatic fluid or inflammatory changes.

Spleen: Unremarkable.

Adrenals/Urinary Tract: 3 nonobstructive calculi are present within
the interpolar and lower pole collecting system of the left kidney,
largest of which measures 9 mm. No additional calculi are identified
within the right renal collecting system, along the course of either
ureter, or within the lumen of the urinary bladder. Fullness of the
left renal pelvis and left renal collecting system, without frank
hydronephrosis. This is also associated with some enhancement of the
urothelium throughout the left renal pelvis and left renal
collecting system. Sub cm low-attenuation lesion in the posterior
aspect of the interpolar region of the right kidney. No other
suspicious renal lesions are noted. On delayed images, there are no
filling defects within the collecting system of either kidney, along
the well opacified portions of either ureter (the distal third of
the right ureter is incompletely opacified), or within the lumen of
the urinary bladder to strongly suggest the presence of a urothelial
neoplasm at this time. Urinary bladder is normal in appearance.
Bilateral adrenal glands are normal in appearance.

Stomach/Bowel: Normal appearance of the stomach. No pathologic
dilatation of small bowel or colon. Normal appendix.

Vascular/Lymphatic: Atherosclerosis throughout the abdominal and
pelvic vasculature, without evidence of aneurysm and dissection. No
lymphadenopathy noted in the abdomen or pelvis.

Reproductive: Status post hysterectomy. Ovaries are not confidently
identified, and may be surgically absent or atrophic.

Other: No significant volume of ascites.  No pneumoperitoneum.

Musculoskeletal: There are no aggressive appearing lytic or blastic
lesions noted in the visualized portions of the skeleton.
IMPRESSION: 1. Several nonobstructive calculi are present within the left renal
collecting system. No ureteral stones or findings to suggest urinary
tract obstruction are noted at this time.
2. However, there is mild prominence of the left renal pelvis, as
well as avid enhancement of the urothelium in the left renal pelvis
and left renal collecting system, which is favored to be related to
chronic inflammation from the indwelling stones. The possibility of
upper tract infection is not excluded, however, there are no overt
imaging findings on today's examination to suggest pyelonephritis.
Correlation with urinalysis and urine cytology is recommended.
3. Atherosclerosis.
4. Additional incidental findings, as above.

## 2016-06-23 DIAGNOSIS — E538 Deficiency of other specified B group vitamins: Secondary | ICD-10-CM | POA: Diagnosis not present

## 2016-06-23 DIAGNOSIS — I10 Essential (primary) hypertension: Secondary | ICD-10-CM | POA: Diagnosis not present

## 2016-06-23 DIAGNOSIS — R42 Dizziness and giddiness: Secondary | ICD-10-CM | POA: Diagnosis not present

## 2016-06-23 DIAGNOSIS — Z7902 Long term (current) use of antithrombotics/antiplatelets: Secondary | ICD-10-CM | POA: Diagnosis not present

## 2016-06-23 DIAGNOSIS — J441 Chronic obstructive pulmonary disease with (acute) exacerbation: Secondary | ICD-10-CM | POA: Diagnosis not present

## 2016-06-23 DIAGNOSIS — M1990 Unspecified osteoarthritis, unspecified site: Secondary | ICD-10-CM | POA: Diagnosis not present

## 2016-06-23 DIAGNOSIS — Z955 Presence of coronary angioplasty implant and graft: Secondary | ICD-10-CM | POA: Diagnosis not present

## 2016-06-23 DIAGNOSIS — I2510 Atherosclerotic heart disease of native coronary artery without angina pectoris: Secondary | ICD-10-CM | POA: Diagnosis not present

## 2016-06-23 DIAGNOSIS — Z7982 Long term (current) use of aspirin: Secondary | ICD-10-CM | POA: Diagnosis not present

## 2016-06-23 DIAGNOSIS — Z9981 Dependence on supplemental oxygen: Secondary | ICD-10-CM | POA: Diagnosis not present

## 2016-06-24 DIAGNOSIS — I2510 Atherosclerotic heart disease of native coronary artery without angina pectoris: Secondary | ICD-10-CM | POA: Diagnosis not present

## 2016-06-24 DIAGNOSIS — E538 Deficiency of other specified B group vitamins: Secondary | ICD-10-CM | POA: Diagnosis not present

## 2016-06-24 DIAGNOSIS — Z7982 Long term (current) use of aspirin: Secondary | ICD-10-CM | POA: Diagnosis not present

## 2016-06-24 DIAGNOSIS — Z9981 Dependence on supplemental oxygen: Secondary | ICD-10-CM | POA: Diagnosis not present

## 2016-06-24 DIAGNOSIS — R42 Dizziness and giddiness: Secondary | ICD-10-CM | POA: Diagnosis not present

## 2016-06-24 DIAGNOSIS — Z955 Presence of coronary angioplasty implant and graft: Secondary | ICD-10-CM | POA: Diagnosis not present

## 2016-06-24 DIAGNOSIS — I10 Essential (primary) hypertension: Secondary | ICD-10-CM | POA: Diagnosis not present

## 2016-06-24 DIAGNOSIS — Z7902 Long term (current) use of antithrombotics/antiplatelets: Secondary | ICD-10-CM | POA: Diagnosis not present

## 2016-06-24 DIAGNOSIS — M1990 Unspecified osteoarthritis, unspecified site: Secondary | ICD-10-CM | POA: Diagnosis not present

## 2016-06-24 DIAGNOSIS — J441 Chronic obstructive pulmonary disease with (acute) exacerbation: Secondary | ICD-10-CM | POA: Diagnosis not present

## 2016-06-25 DIAGNOSIS — M1990 Unspecified osteoarthritis, unspecified site: Secondary | ICD-10-CM | POA: Diagnosis not present

## 2016-06-25 DIAGNOSIS — Z955 Presence of coronary angioplasty implant and graft: Secondary | ICD-10-CM | POA: Diagnosis not present

## 2016-06-25 DIAGNOSIS — J441 Chronic obstructive pulmonary disease with (acute) exacerbation: Secondary | ICD-10-CM | POA: Diagnosis not present

## 2016-06-25 DIAGNOSIS — E538 Deficiency of other specified B group vitamins: Secondary | ICD-10-CM | POA: Diagnosis not present

## 2016-06-25 DIAGNOSIS — Z7982 Long term (current) use of aspirin: Secondary | ICD-10-CM | POA: Diagnosis not present

## 2016-06-25 DIAGNOSIS — Z7902 Long term (current) use of antithrombotics/antiplatelets: Secondary | ICD-10-CM | POA: Diagnosis not present

## 2016-06-25 DIAGNOSIS — R42 Dizziness and giddiness: Secondary | ICD-10-CM | POA: Diagnosis not present

## 2016-06-25 DIAGNOSIS — Z9981 Dependence on supplemental oxygen: Secondary | ICD-10-CM | POA: Diagnosis not present

## 2016-06-25 DIAGNOSIS — I10 Essential (primary) hypertension: Secondary | ICD-10-CM | POA: Diagnosis not present

## 2016-06-25 DIAGNOSIS — I2510 Atherosclerotic heart disease of native coronary artery without angina pectoris: Secondary | ICD-10-CM | POA: Diagnosis not present

## 2016-06-29 DIAGNOSIS — R42 Dizziness and giddiness: Secondary | ICD-10-CM | POA: Diagnosis not present

## 2016-06-29 DIAGNOSIS — Z7982 Long term (current) use of aspirin: Secondary | ICD-10-CM | POA: Diagnosis not present

## 2016-06-29 DIAGNOSIS — J441 Chronic obstructive pulmonary disease with (acute) exacerbation: Secondary | ICD-10-CM | POA: Diagnosis not present

## 2016-06-29 DIAGNOSIS — M1990 Unspecified osteoarthritis, unspecified site: Secondary | ICD-10-CM | POA: Diagnosis not present

## 2016-06-29 DIAGNOSIS — I2510 Atherosclerotic heart disease of native coronary artery without angina pectoris: Secondary | ICD-10-CM | POA: Diagnosis not present

## 2016-06-29 DIAGNOSIS — E538 Deficiency of other specified B group vitamins: Secondary | ICD-10-CM | POA: Diagnosis not present

## 2016-06-29 DIAGNOSIS — Z7902 Long term (current) use of antithrombotics/antiplatelets: Secondary | ICD-10-CM | POA: Diagnosis not present

## 2016-06-29 DIAGNOSIS — Z955 Presence of coronary angioplasty implant and graft: Secondary | ICD-10-CM | POA: Diagnosis not present

## 2016-06-29 DIAGNOSIS — Z9981 Dependence on supplemental oxygen: Secondary | ICD-10-CM | POA: Diagnosis not present

## 2016-06-29 DIAGNOSIS — I10 Essential (primary) hypertension: Secondary | ICD-10-CM | POA: Diagnosis not present

## 2016-06-30 DIAGNOSIS — R42 Dizziness and giddiness: Secondary | ICD-10-CM | POA: Diagnosis not present

## 2016-06-30 DIAGNOSIS — M1990 Unspecified osteoarthritis, unspecified site: Secondary | ICD-10-CM | POA: Diagnosis not present

## 2016-06-30 DIAGNOSIS — J441 Chronic obstructive pulmonary disease with (acute) exacerbation: Secondary | ICD-10-CM | POA: Diagnosis not present

## 2016-06-30 DIAGNOSIS — I10 Essential (primary) hypertension: Secondary | ICD-10-CM | POA: Diagnosis not present

## 2016-06-30 DIAGNOSIS — Z955 Presence of coronary angioplasty implant and graft: Secondary | ICD-10-CM | POA: Diagnosis not present

## 2016-06-30 DIAGNOSIS — I2510 Atherosclerotic heart disease of native coronary artery without angina pectoris: Secondary | ICD-10-CM | POA: Diagnosis not present

## 2016-06-30 DIAGNOSIS — E538 Deficiency of other specified B group vitamins: Secondary | ICD-10-CM | POA: Diagnosis not present

## 2016-06-30 DIAGNOSIS — Z9981 Dependence on supplemental oxygen: Secondary | ICD-10-CM | POA: Diagnosis not present

## 2016-06-30 DIAGNOSIS — Z7982 Long term (current) use of aspirin: Secondary | ICD-10-CM | POA: Diagnosis not present

## 2016-06-30 DIAGNOSIS — Z7902 Long term (current) use of antithrombotics/antiplatelets: Secondary | ICD-10-CM | POA: Diagnosis not present

## 2016-07-01 DIAGNOSIS — Z955 Presence of coronary angioplasty implant and graft: Secondary | ICD-10-CM | POA: Diagnosis not present

## 2016-07-01 DIAGNOSIS — I10 Essential (primary) hypertension: Secondary | ICD-10-CM | POA: Diagnosis not present

## 2016-07-01 DIAGNOSIS — Z9981 Dependence on supplemental oxygen: Secondary | ICD-10-CM | POA: Diagnosis not present

## 2016-07-01 DIAGNOSIS — E538 Deficiency of other specified B group vitamins: Secondary | ICD-10-CM | POA: Diagnosis not present

## 2016-07-01 DIAGNOSIS — J441 Chronic obstructive pulmonary disease with (acute) exacerbation: Secondary | ICD-10-CM | POA: Diagnosis not present

## 2016-07-01 DIAGNOSIS — R42 Dizziness and giddiness: Secondary | ICD-10-CM | POA: Diagnosis not present

## 2016-07-01 DIAGNOSIS — Z7982 Long term (current) use of aspirin: Secondary | ICD-10-CM | POA: Diagnosis not present

## 2016-07-01 DIAGNOSIS — M1990 Unspecified osteoarthritis, unspecified site: Secondary | ICD-10-CM | POA: Diagnosis not present

## 2016-07-01 DIAGNOSIS — Z7902 Long term (current) use of antithrombotics/antiplatelets: Secondary | ICD-10-CM | POA: Diagnosis not present

## 2016-07-01 DIAGNOSIS — I2510 Atherosclerotic heart disease of native coronary artery without angina pectoris: Secondary | ICD-10-CM | POA: Diagnosis not present

## 2016-07-03 DIAGNOSIS — Z9981 Dependence on supplemental oxygen: Secondary | ICD-10-CM | POA: Diagnosis not present

## 2016-07-03 DIAGNOSIS — R42 Dizziness and giddiness: Secondary | ICD-10-CM | POA: Diagnosis not present

## 2016-07-03 DIAGNOSIS — I10 Essential (primary) hypertension: Secondary | ICD-10-CM | POA: Diagnosis not present

## 2016-07-03 DIAGNOSIS — Z7902 Long term (current) use of antithrombotics/antiplatelets: Secondary | ICD-10-CM | POA: Diagnosis not present

## 2016-07-03 DIAGNOSIS — Z955 Presence of coronary angioplasty implant and graft: Secondary | ICD-10-CM | POA: Diagnosis not present

## 2016-07-03 DIAGNOSIS — E538 Deficiency of other specified B group vitamins: Secondary | ICD-10-CM | POA: Diagnosis not present

## 2016-07-03 DIAGNOSIS — M1990 Unspecified osteoarthritis, unspecified site: Secondary | ICD-10-CM | POA: Diagnosis not present

## 2016-07-03 DIAGNOSIS — Z7982 Long term (current) use of aspirin: Secondary | ICD-10-CM | POA: Diagnosis not present

## 2016-07-03 DIAGNOSIS — J441 Chronic obstructive pulmonary disease with (acute) exacerbation: Secondary | ICD-10-CM | POA: Diagnosis not present

## 2016-07-03 DIAGNOSIS — I2510 Atherosclerotic heart disease of native coronary artery without angina pectoris: Secondary | ICD-10-CM | POA: Diagnosis not present

## 2016-07-06 DIAGNOSIS — J441 Chronic obstructive pulmonary disease with (acute) exacerbation: Secondary | ICD-10-CM | POA: Diagnosis not present

## 2016-07-06 DIAGNOSIS — I10 Essential (primary) hypertension: Secondary | ICD-10-CM | POA: Diagnosis not present

## 2016-07-06 DIAGNOSIS — Z9981 Dependence on supplemental oxygen: Secondary | ICD-10-CM | POA: Diagnosis not present

## 2016-07-06 DIAGNOSIS — M1990 Unspecified osteoarthritis, unspecified site: Secondary | ICD-10-CM | POA: Diagnosis not present

## 2016-07-06 DIAGNOSIS — I2510 Atherosclerotic heart disease of native coronary artery without angina pectoris: Secondary | ICD-10-CM | POA: Diagnosis not present

## 2016-07-06 DIAGNOSIS — E538 Deficiency of other specified B group vitamins: Secondary | ICD-10-CM | POA: Diagnosis not present

## 2016-07-06 DIAGNOSIS — Z7902 Long term (current) use of antithrombotics/antiplatelets: Secondary | ICD-10-CM | POA: Diagnosis not present

## 2016-07-06 DIAGNOSIS — R42 Dizziness and giddiness: Secondary | ICD-10-CM | POA: Diagnosis not present

## 2016-07-06 DIAGNOSIS — Z955 Presence of coronary angioplasty implant and graft: Secondary | ICD-10-CM | POA: Diagnosis not present

## 2016-07-06 DIAGNOSIS — Z7982 Long term (current) use of aspirin: Secondary | ICD-10-CM | POA: Diagnosis not present

## 2016-07-07 DIAGNOSIS — E538 Deficiency of other specified B group vitamins: Secondary | ICD-10-CM | POA: Diagnosis not present

## 2016-07-07 DIAGNOSIS — R42 Dizziness and giddiness: Secondary | ICD-10-CM | POA: Diagnosis not present

## 2016-07-07 DIAGNOSIS — Z9981 Dependence on supplemental oxygen: Secondary | ICD-10-CM | POA: Diagnosis not present

## 2016-07-07 DIAGNOSIS — Z7902 Long term (current) use of antithrombotics/antiplatelets: Secondary | ICD-10-CM | POA: Diagnosis not present

## 2016-07-07 DIAGNOSIS — M1990 Unspecified osteoarthritis, unspecified site: Secondary | ICD-10-CM | POA: Diagnosis not present

## 2016-07-07 DIAGNOSIS — J441 Chronic obstructive pulmonary disease with (acute) exacerbation: Secondary | ICD-10-CM | POA: Diagnosis not present

## 2016-07-07 DIAGNOSIS — I10 Essential (primary) hypertension: Secondary | ICD-10-CM | POA: Diagnosis not present

## 2016-07-07 DIAGNOSIS — Z7982 Long term (current) use of aspirin: Secondary | ICD-10-CM | POA: Diagnosis not present

## 2016-07-07 DIAGNOSIS — Z955 Presence of coronary angioplasty implant and graft: Secondary | ICD-10-CM | POA: Diagnosis not present

## 2016-07-07 DIAGNOSIS — I2510 Atherosclerotic heart disease of native coronary artery without angina pectoris: Secondary | ICD-10-CM | POA: Diagnosis not present

## 2016-07-08 DIAGNOSIS — Z955 Presence of coronary angioplasty implant and graft: Secondary | ICD-10-CM | POA: Diagnosis not present

## 2016-07-08 DIAGNOSIS — Z7982 Long term (current) use of aspirin: Secondary | ICD-10-CM | POA: Diagnosis not present

## 2016-07-08 DIAGNOSIS — E538 Deficiency of other specified B group vitamins: Secondary | ICD-10-CM | POA: Diagnosis not present

## 2016-07-08 DIAGNOSIS — Z9981 Dependence on supplemental oxygen: Secondary | ICD-10-CM | POA: Diagnosis not present

## 2016-07-08 DIAGNOSIS — M1990 Unspecified osteoarthritis, unspecified site: Secondary | ICD-10-CM | POA: Diagnosis not present

## 2016-07-08 DIAGNOSIS — I2510 Atherosclerotic heart disease of native coronary artery without angina pectoris: Secondary | ICD-10-CM | POA: Diagnosis not present

## 2016-07-08 DIAGNOSIS — I10 Essential (primary) hypertension: Secondary | ICD-10-CM | POA: Diagnosis not present

## 2016-07-08 DIAGNOSIS — J441 Chronic obstructive pulmonary disease with (acute) exacerbation: Secondary | ICD-10-CM | POA: Diagnosis not present

## 2016-07-08 DIAGNOSIS — R42 Dizziness and giddiness: Secondary | ICD-10-CM | POA: Diagnosis not present

## 2016-07-08 DIAGNOSIS — Z7902 Long term (current) use of antithrombotics/antiplatelets: Secondary | ICD-10-CM | POA: Diagnosis not present

## 2016-07-09 ENCOUNTER — Ambulatory Visit (INDEPENDENT_AMBULATORY_CARE_PROVIDER_SITE_OTHER): Payer: Medicare Other

## 2016-07-09 DIAGNOSIS — I10 Essential (primary) hypertension: Secondary | ICD-10-CM | POA: Diagnosis not present

## 2016-07-09 DIAGNOSIS — E538 Deficiency of other specified B group vitamins: Secondary | ICD-10-CM | POA: Diagnosis not present

## 2016-07-09 DIAGNOSIS — I2510 Atherosclerotic heart disease of native coronary artery without angina pectoris: Secondary | ICD-10-CM | POA: Diagnosis not present

## 2016-07-09 DIAGNOSIS — J441 Chronic obstructive pulmonary disease with (acute) exacerbation: Secondary | ICD-10-CM | POA: Diagnosis not present

## 2016-07-09 DIAGNOSIS — R42 Dizziness and giddiness: Secondary | ICD-10-CM | POA: Diagnosis not present

## 2016-07-09 DIAGNOSIS — M1990 Unspecified osteoarthritis, unspecified site: Secondary | ICD-10-CM | POA: Diagnosis not present

## 2016-07-09 DIAGNOSIS — Z7902 Long term (current) use of antithrombotics/antiplatelets: Secondary | ICD-10-CM | POA: Diagnosis not present

## 2016-07-09 DIAGNOSIS — Z7982 Long term (current) use of aspirin: Secondary | ICD-10-CM | POA: Diagnosis not present

## 2016-07-09 DIAGNOSIS — Z9981 Dependence on supplemental oxygen: Secondary | ICD-10-CM | POA: Diagnosis not present

## 2016-07-09 DIAGNOSIS — Z955 Presence of coronary angioplasty implant and graft: Secondary | ICD-10-CM | POA: Diagnosis not present

## 2016-07-09 MED ORDER — CYANOCOBALAMIN 1000 MCG/ML IJ SOLN
1000.0000 ug | Freq: Once | INTRAMUSCULAR | Status: AC
  Administered 2016-07-09: 1000 ug via INTRAMUSCULAR

## 2016-07-13 DIAGNOSIS — E538 Deficiency of other specified B group vitamins: Secondary | ICD-10-CM | POA: Diagnosis not present

## 2016-07-13 DIAGNOSIS — R42 Dizziness and giddiness: Secondary | ICD-10-CM | POA: Diagnosis not present

## 2016-07-13 DIAGNOSIS — J441 Chronic obstructive pulmonary disease with (acute) exacerbation: Secondary | ICD-10-CM | POA: Diagnosis not present

## 2016-07-13 DIAGNOSIS — Z7902 Long term (current) use of antithrombotics/antiplatelets: Secondary | ICD-10-CM | POA: Diagnosis not present

## 2016-07-13 DIAGNOSIS — M1990 Unspecified osteoarthritis, unspecified site: Secondary | ICD-10-CM | POA: Diagnosis not present

## 2016-07-13 DIAGNOSIS — I2510 Atherosclerotic heart disease of native coronary artery without angina pectoris: Secondary | ICD-10-CM | POA: Diagnosis not present

## 2016-07-13 DIAGNOSIS — Z7982 Long term (current) use of aspirin: Secondary | ICD-10-CM | POA: Diagnosis not present

## 2016-07-13 DIAGNOSIS — I10 Essential (primary) hypertension: Secondary | ICD-10-CM | POA: Diagnosis not present

## 2016-07-13 DIAGNOSIS — Z9981 Dependence on supplemental oxygen: Secondary | ICD-10-CM | POA: Diagnosis not present

## 2016-07-13 DIAGNOSIS — Z955 Presence of coronary angioplasty implant and graft: Secondary | ICD-10-CM | POA: Diagnosis not present

## 2016-07-14 DIAGNOSIS — R42 Dizziness and giddiness: Secondary | ICD-10-CM | POA: Diagnosis not present

## 2016-07-14 DIAGNOSIS — Z7902 Long term (current) use of antithrombotics/antiplatelets: Secondary | ICD-10-CM | POA: Diagnosis not present

## 2016-07-14 DIAGNOSIS — Z7982 Long term (current) use of aspirin: Secondary | ICD-10-CM | POA: Diagnosis not present

## 2016-07-14 DIAGNOSIS — I2510 Atherosclerotic heart disease of native coronary artery without angina pectoris: Secondary | ICD-10-CM | POA: Diagnosis not present

## 2016-07-14 DIAGNOSIS — J449 Chronic obstructive pulmonary disease, unspecified: Secondary | ICD-10-CM | POA: Diagnosis not present

## 2016-07-14 DIAGNOSIS — G4733 Obstructive sleep apnea (adult) (pediatric): Secondary | ICD-10-CM | POA: Diagnosis not present

## 2016-07-14 DIAGNOSIS — Z9981 Dependence on supplemental oxygen: Secondary | ICD-10-CM | POA: Diagnosis not present

## 2016-07-14 DIAGNOSIS — I10 Essential (primary) hypertension: Secondary | ICD-10-CM | POA: Diagnosis not present

## 2016-07-14 DIAGNOSIS — E538 Deficiency of other specified B group vitamins: Secondary | ICD-10-CM | POA: Diagnosis not present

## 2016-07-14 DIAGNOSIS — J441 Chronic obstructive pulmonary disease with (acute) exacerbation: Secondary | ICD-10-CM | POA: Diagnosis not present

## 2016-07-14 DIAGNOSIS — Z955 Presence of coronary angioplasty implant and graft: Secondary | ICD-10-CM | POA: Diagnosis not present

## 2016-07-14 DIAGNOSIS — M1990 Unspecified osteoarthritis, unspecified site: Secondary | ICD-10-CM | POA: Diagnosis not present

## 2016-07-14 DIAGNOSIS — I509 Heart failure, unspecified: Secondary | ICD-10-CM | POA: Diagnosis not present

## 2016-07-15 DIAGNOSIS — I2510 Atherosclerotic heart disease of native coronary artery without angina pectoris: Secondary | ICD-10-CM | POA: Diagnosis not present

## 2016-07-15 DIAGNOSIS — R42 Dizziness and giddiness: Secondary | ICD-10-CM | POA: Diagnosis not present

## 2016-07-15 DIAGNOSIS — I10 Essential (primary) hypertension: Secondary | ICD-10-CM | POA: Diagnosis not present

## 2016-07-15 DIAGNOSIS — J441 Chronic obstructive pulmonary disease with (acute) exacerbation: Secondary | ICD-10-CM | POA: Diagnosis not present

## 2016-07-15 DIAGNOSIS — E538 Deficiency of other specified B group vitamins: Secondary | ICD-10-CM | POA: Diagnosis not present

## 2016-07-15 DIAGNOSIS — Z7982 Long term (current) use of aspirin: Secondary | ICD-10-CM | POA: Diagnosis not present

## 2016-07-15 DIAGNOSIS — Z955 Presence of coronary angioplasty implant and graft: Secondary | ICD-10-CM | POA: Diagnosis not present

## 2016-07-15 DIAGNOSIS — M1990 Unspecified osteoarthritis, unspecified site: Secondary | ICD-10-CM | POA: Diagnosis not present

## 2016-07-15 DIAGNOSIS — Z9981 Dependence on supplemental oxygen: Secondary | ICD-10-CM | POA: Diagnosis not present

## 2016-07-15 DIAGNOSIS — Z7902 Long term (current) use of antithrombotics/antiplatelets: Secondary | ICD-10-CM | POA: Diagnosis not present

## 2016-07-16 DIAGNOSIS — R42 Dizziness and giddiness: Secondary | ICD-10-CM | POA: Diagnosis not present

## 2016-07-16 DIAGNOSIS — I10 Essential (primary) hypertension: Secondary | ICD-10-CM | POA: Diagnosis not present

## 2016-07-16 DIAGNOSIS — Z9981 Dependence on supplemental oxygen: Secondary | ICD-10-CM | POA: Diagnosis not present

## 2016-07-16 DIAGNOSIS — Z7902 Long term (current) use of antithrombotics/antiplatelets: Secondary | ICD-10-CM | POA: Diagnosis not present

## 2016-07-16 DIAGNOSIS — J441 Chronic obstructive pulmonary disease with (acute) exacerbation: Secondary | ICD-10-CM | POA: Diagnosis not present

## 2016-07-16 DIAGNOSIS — E538 Deficiency of other specified B group vitamins: Secondary | ICD-10-CM | POA: Diagnosis not present

## 2016-07-16 DIAGNOSIS — Z955 Presence of coronary angioplasty implant and graft: Secondary | ICD-10-CM | POA: Diagnosis not present

## 2016-07-16 DIAGNOSIS — M1990 Unspecified osteoarthritis, unspecified site: Secondary | ICD-10-CM | POA: Diagnosis not present

## 2016-07-16 DIAGNOSIS — I2510 Atherosclerotic heart disease of native coronary artery without angina pectoris: Secondary | ICD-10-CM | POA: Diagnosis not present

## 2016-07-16 DIAGNOSIS — Z7982 Long term (current) use of aspirin: Secondary | ICD-10-CM | POA: Diagnosis not present

## 2016-07-20 DIAGNOSIS — R42 Dizziness and giddiness: Secondary | ICD-10-CM | POA: Diagnosis not present

## 2016-07-20 DIAGNOSIS — I2510 Atherosclerotic heart disease of native coronary artery without angina pectoris: Secondary | ICD-10-CM | POA: Diagnosis not present

## 2016-07-20 DIAGNOSIS — Z7982 Long term (current) use of aspirin: Secondary | ICD-10-CM | POA: Diagnosis not present

## 2016-07-20 DIAGNOSIS — J441 Chronic obstructive pulmonary disease with (acute) exacerbation: Secondary | ICD-10-CM | POA: Diagnosis not present

## 2016-07-20 DIAGNOSIS — Z955 Presence of coronary angioplasty implant and graft: Secondary | ICD-10-CM | POA: Diagnosis not present

## 2016-07-20 DIAGNOSIS — Z7902 Long term (current) use of antithrombotics/antiplatelets: Secondary | ICD-10-CM | POA: Diagnosis not present

## 2016-07-20 DIAGNOSIS — M1990 Unspecified osteoarthritis, unspecified site: Secondary | ICD-10-CM | POA: Diagnosis not present

## 2016-07-20 DIAGNOSIS — Z9981 Dependence on supplemental oxygen: Secondary | ICD-10-CM | POA: Diagnosis not present

## 2016-07-20 DIAGNOSIS — I10 Essential (primary) hypertension: Secondary | ICD-10-CM | POA: Diagnosis not present

## 2016-07-20 DIAGNOSIS — E538 Deficiency of other specified B group vitamins: Secondary | ICD-10-CM | POA: Diagnosis not present

## 2016-07-21 DIAGNOSIS — Z7982 Long term (current) use of aspirin: Secondary | ICD-10-CM | POA: Diagnosis not present

## 2016-07-21 DIAGNOSIS — I10 Essential (primary) hypertension: Secondary | ICD-10-CM | POA: Diagnosis not present

## 2016-07-21 DIAGNOSIS — Z9981 Dependence on supplemental oxygen: Secondary | ICD-10-CM | POA: Diagnosis not present

## 2016-07-21 DIAGNOSIS — E538 Deficiency of other specified B group vitamins: Secondary | ICD-10-CM | POA: Diagnosis not present

## 2016-07-21 DIAGNOSIS — I2510 Atherosclerotic heart disease of native coronary artery without angina pectoris: Secondary | ICD-10-CM | POA: Diagnosis not present

## 2016-07-21 DIAGNOSIS — Z955 Presence of coronary angioplasty implant and graft: Secondary | ICD-10-CM | POA: Diagnosis not present

## 2016-07-21 DIAGNOSIS — R42 Dizziness and giddiness: Secondary | ICD-10-CM | POA: Diagnosis not present

## 2016-07-21 DIAGNOSIS — J441 Chronic obstructive pulmonary disease with (acute) exacerbation: Secondary | ICD-10-CM | POA: Diagnosis not present

## 2016-07-21 DIAGNOSIS — Z7902 Long term (current) use of antithrombotics/antiplatelets: Secondary | ICD-10-CM | POA: Diagnosis not present

## 2016-07-21 DIAGNOSIS — M1990 Unspecified osteoarthritis, unspecified site: Secondary | ICD-10-CM | POA: Diagnosis not present

## 2016-07-22 DIAGNOSIS — Z9981 Dependence on supplemental oxygen: Secondary | ICD-10-CM | POA: Diagnosis not present

## 2016-07-22 DIAGNOSIS — J441 Chronic obstructive pulmonary disease with (acute) exacerbation: Secondary | ICD-10-CM | POA: Diagnosis not present

## 2016-07-22 DIAGNOSIS — Z7902 Long term (current) use of antithrombotics/antiplatelets: Secondary | ICD-10-CM | POA: Diagnosis not present

## 2016-07-22 DIAGNOSIS — R42 Dizziness and giddiness: Secondary | ICD-10-CM | POA: Diagnosis not present

## 2016-07-22 DIAGNOSIS — I10 Essential (primary) hypertension: Secondary | ICD-10-CM | POA: Diagnosis not present

## 2016-07-22 DIAGNOSIS — E538 Deficiency of other specified B group vitamins: Secondary | ICD-10-CM | POA: Diagnosis not present

## 2016-07-22 DIAGNOSIS — I2510 Atherosclerotic heart disease of native coronary artery without angina pectoris: Secondary | ICD-10-CM | POA: Diagnosis not present

## 2016-07-22 DIAGNOSIS — M1990 Unspecified osteoarthritis, unspecified site: Secondary | ICD-10-CM | POA: Diagnosis not present

## 2016-07-22 DIAGNOSIS — Z955 Presence of coronary angioplasty implant and graft: Secondary | ICD-10-CM | POA: Diagnosis not present

## 2016-07-22 DIAGNOSIS — Z7982 Long term (current) use of aspirin: Secondary | ICD-10-CM | POA: Diagnosis not present

## 2016-07-23 DIAGNOSIS — Z955 Presence of coronary angioplasty implant and graft: Secondary | ICD-10-CM | POA: Diagnosis not present

## 2016-07-23 DIAGNOSIS — Z7902 Long term (current) use of antithrombotics/antiplatelets: Secondary | ICD-10-CM | POA: Diagnosis not present

## 2016-07-23 DIAGNOSIS — R42 Dizziness and giddiness: Secondary | ICD-10-CM | POA: Diagnosis not present

## 2016-07-23 DIAGNOSIS — I509 Heart failure, unspecified: Secondary | ICD-10-CM | POA: Diagnosis not present

## 2016-07-23 DIAGNOSIS — Z7982 Long term (current) use of aspirin: Secondary | ICD-10-CM | POA: Diagnosis not present

## 2016-07-23 DIAGNOSIS — E538 Deficiency of other specified B group vitamins: Secondary | ICD-10-CM | POA: Diagnosis not present

## 2016-07-23 DIAGNOSIS — J441 Chronic obstructive pulmonary disease with (acute) exacerbation: Secondary | ICD-10-CM | POA: Diagnosis not present

## 2016-07-23 DIAGNOSIS — I2510 Atherosclerotic heart disease of native coronary artery without angina pectoris: Secondary | ICD-10-CM | POA: Diagnosis not present

## 2016-07-23 DIAGNOSIS — J449 Chronic obstructive pulmonary disease, unspecified: Secondary | ICD-10-CM | POA: Diagnosis not present

## 2016-07-23 DIAGNOSIS — G4733 Obstructive sleep apnea (adult) (pediatric): Secondary | ICD-10-CM | POA: Diagnosis not present

## 2016-07-23 DIAGNOSIS — Z9981 Dependence on supplemental oxygen: Secondary | ICD-10-CM | POA: Diagnosis not present

## 2016-07-23 DIAGNOSIS — I10 Essential (primary) hypertension: Secondary | ICD-10-CM | POA: Diagnosis not present

## 2016-07-23 DIAGNOSIS — M1990 Unspecified osteoarthritis, unspecified site: Secondary | ICD-10-CM | POA: Diagnosis not present

## 2016-07-27 DIAGNOSIS — Z7982 Long term (current) use of aspirin: Secondary | ICD-10-CM | POA: Diagnosis not present

## 2016-07-27 DIAGNOSIS — R42 Dizziness and giddiness: Secondary | ICD-10-CM | POA: Diagnosis not present

## 2016-07-27 DIAGNOSIS — Z955 Presence of coronary angioplasty implant and graft: Secondary | ICD-10-CM | POA: Diagnosis not present

## 2016-07-27 DIAGNOSIS — E538 Deficiency of other specified B group vitamins: Secondary | ICD-10-CM | POA: Diagnosis not present

## 2016-07-27 DIAGNOSIS — I2510 Atherosclerotic heart disease of native coronary artery without angina pectoris: Secondary | ICD-10-CM | POA: Diagnosis not present

## 2016-07-27 DIAGNOSIS — M1990 Unspecified osteoarthritis, unspecified site: Secondary | ICD-10-CM | POA: Diagnosis not present

## 2016-07-27 DIAGNOSIS — J441 Chronic obstructive pulmonary disease with (acute) exacerbation: Secondary | ICD-10-CM | POA: Diagnosis not present

## 2016-07-27 DIAGNOSIS — Z9981 Dependence on supplemental oxygen: Secondary | ICD-10-CM | POA: Diagnosis not present

## 2016-07-27 DIAGNOSIS — Z7902 Long term (current) use of antithrombotics/antiplatelets: Secondary | ICD-10-CM | POA: Diagnosis not present

## 2016-07-27 DIAGNOSIS — I10 Essential (primary) hypertension: Secondary | ICD-10-CM | POA: Diagnosis not present

## 2016-07-30 DIAGNOSIS — R42 Dizziness and giddiness: Secondary | ICD-10-CM | POA: Diagnosis not present

## 2016-07-30 DIAGNOSIS — I10 Essential (primary) hypertension: Secondary | ICD-10-CM | POA: Diagnosis not present

## 2016-07-30 DIAGNOSIS — I2510 Atherosclerotic heart disease of native coronary artery without angina pectoris: Secondary | ICD-10-CM | POA: Diagnosis not present

## 2016-07-30 DIAGNOSIS — M1990 Unspecified osteoarthritis, unspecified site: Secondary | ICD-10-CM | POA: Diagnosis not present

## 2016-08-11 ENCOUNTER — Ambulatory Visit (INDEPENDENT_AMBULATORY_CARE_PROVIDER_SITE_OTHER): Payer: Medicare Other

## 2016-08-11 DIAGNOSIS — E538 Deficiency of other specified B group vitamins: Secondary | ICD-10-CM | POA: Diagnosis not present

## 2016-08-11 MED ORDER — CYANOCOBALAMIN 1000 MCG/ML IJ SOLN
1000.0000 ug | Freq: Once | INTRAMUSCULAR | Status: AC
  Administered 2016-08-11: 1000 ug via INTRAMUSCULAR

## 2016-08-13 DIAGNOSIS — G4733 Obstructive sleep apnea (adult) (pediatric): Secondary | ICD-10-CM | POA: Diagnosis not present

## 2016-08-13 DIAGNOSIS — J449 Chronic obstructive pulmonary disease, unspecified: Secondary | ICD-10-CM | POA: Diagnosis not present

## 2016-08-13 DIAGNOSIS — I509 Heart failure, unspecified: Secondary | ICD-10-CM | POA: Diagnosis not present

## 2016-08-22 DIAGNOSIS — G4733 Obstructive sleep apnea (adult) (pediatric): Secondary | ICD-10-CM | POA: Diagnosis not present

## 2016-08-22 DIAGNOSIS — J449 Chronic obstructive pulmonary disease, unspecified: Secondary | ICD-10-CM | POA: Diagnosis not present

## 2016-08-22 DIAGNOSIS — I509 Heart failure, unspecified: Secondary | ICD-10-CM | POA: Diagnosis not present

## 2016-08-28 ENCOUNTER — Other Ambulatory Visit: Payer: Self-pay | Admitting: Family Medicine

## 2016-08-28 DIAGNOSIS — R911 Solitary pulmonary nodule: Secondary | ICD-10-CM | POA: Diagnosis not present

## 2016-08-28 DIAGNOSIS — R0902 Hypoxemia: Secondary | ICD-10-CM | POA: Diagnosis not present

## 2016-08-28 DIAGNOSIS — R0609 Other forms of dyspnea: Secondary | ICD-10-CM | POA: Diagnosis not present

## 2016-08-28 DIAGNOSIS — J449 Chronic obstructive pulmonary disease, unspecified: Secondary | ICD-10-CM | POA: Diagnosis not present

## 2016-08-28 NOTE — Telephone Encounter (Signed)
Ok to refill? Electronically refill request for ALPRAZolam (XANAX) 0.5 MG tablet.  Last prescribed on 02/11/2016.  Last office visit on 02/07/2016.

## 2016-08-30 NOTE — Telephone Encounter (Signed)
Please call in.  Thanks.   

## 2016-08-31 NOTE — Telephone Encounter (Signed)
Medication phoned to pharmacy.  

## 2016-09-04 DIAGNOSIS — J410 Simple chronic bronchitis: Secondary | ICD-10-CM | POA: Diagnosis not present

## 2016-09-04 DIAGNOSIS — I214 Non-ST elevation (NSTEMI) myocardial infarction: Secondary | ICD-10-CM | POA: Diagnosis not present

## 2016-09-04 DIAGNOSIS — R000 Tachycardia, unspecified: Secondary | ICD-10-CM | POA: Diagnosis not present

## 2016-09-04 DIAGNOSIS — R002 Palpitations: Secondary | ICD-10-CM | POA: Diagnosis not present

## 2016-09-04 DIAGNOSIS — I10 Essential (primary) hypertension: Secondary | ICD-10-CM | POA: Diagnosis not present

## 2016-09-04 DIAGNOSIS — I493 Ventricular premature depolarization: Secondary | ICD-10-CM | POA: Diagnosis not present

## 2016-09-04 DIAGNOSIS — I2510 Atherosclerotic heart disease of native coronary artery without angina pectoris: Secondary | ICD-10-CM | POA: Diagnosis not present

## 2016-09-09 DIAGNOSIS — I491 Atrial premature depolarization: Secondary | ICD-10-CM | POA: Diagnosis not present

## 2016-09-10 ENCOUNTER — Ambulatory Visit (INDEPENDENT_AMBULATORY_CARE_PROVIDER_SITE_OTHER): Payer: Medicare Other | Admitting: *Deleted

## 2016-09-10 DIAGNOSIS — I2510 Atherosclerotic heart disease of native coronary artery without angina pectoris: Secondary | ICD-10-CM | POA: Diagnosis not present

## 2016-09-10 DIAGNOSIS — E538 Deficiency of other specified B group vitamins: Secondary | ICD-10-CM

## 2016-09-10 DIAGNOSIS — I214 Non-ST elevation (NSTEMI) myocardial infarction: Secondary | ICD-10-CM | POA: Diagnosis not present

## 2016-09-10 DIAGNOSIS — I493 Ventricular premature depolarization: Secondary | ICD-10-CM | POA: Diagnosis not present

## 2016-09-10 DIAGNOSIS — I10 Essential (primary) hypertension: Secondary | ICD-10-CM | POA: Diagnosis not present

## 2016-09-10 DIAGNOSIS — J410 Simple chronic bronchitis: Secondary | ICD-10-CM | POA: Diagnosis not present

## 2016-09-13 DIAGNOSIS — I509 Heart failure, unspecified: Secondary | ICD-10-CM | POA: Diagnosis not present

## 2016-09-13 DIAGNOSIS — G4733 Obstructive sleep apnea (adult) (pediatric): Secondary | ICD-10-CM | POA: Diagnosis not present

## 2016-09-13 DIAGNOSIS — J449 Chronic obstructive pulmonary disease, unspecified: Secondary | ICD-10-CM | POA: Diagnosis not present

## 2016-09-14 ENCOUNTER — Other Ambulatory Visit: Payer: Self-pay | Admitting: Family Medicine

## 2016-09-17 ENCOUNTER — Telehealth: Payer: Self-pay | Admitting: Family Medicine

## 2016-09-17 NOTE — Telephone Encounter (Signed)
Patient Name: Mckenzie Mclaughlin  DOB: Dec 28, 1940    Initial Comment Caller states she has sharp pains on the right side of her face shooting up to her head and it makes her eye water.   Nurse Assessment  Nurse: [name], RN, [name] Date/Time [name] Time): 09/17/2016 8:56:23 AM  Confirm and document reason for call. If symptomatic, describe symptoms. ---Caller states that she has pain on the right side of her face which began Monday. Denies toothache. The pain shoots up into her head. She rates her pain as 7 on 0-10 scale. Unsure if she has a fever. Denies swelling of the face. Her heart rate has been up to 124 this morning, but is about 90 at present. Her breathing is about normal for her, she has COPD.  Does the patient have any new or worsening symptoms? ---Yes  Will a triage be completed? ---Yes  Related visit to physician within the last 2 weeks? ---No  Does the PT have any chronic conditions? (i.e. diabetes, asthma, etc.) ---Yes  List chronic conditions. ---COPD  Is this a behavioral health or substance abuse call? ---No     Guidelines    Guideline Title Affirmed Question Affirmed Notes  Face Pain Swelling around the eye    Final Disposition User   See Physician within 4 Hours (or PCP triage) [name], RN, [name]    Comments  There is some swelling under her right eye.  No appointments available at [facility]. Offered to check other facilities, but she declined. She states she will go to [facility] PCP OFFICE   Disagree/Comply: Comply

## 2016-09-22 DIAGNOSIS — J449 Chronic obstructive pulmonary disease, unspecified: Secondary | ICD-10-CM | POA: Diagnosis not present

## 2016-09-22 DIAGNOSIS — G4733 Obstructive sleep apnea (adult) (pediatric): Secondary | ICD-10-CM | POA: Diagnosis not present

## 2016-09-22 DIAGNOSIS — I509 Heart failure, unspecified: Secondary | ICD-10-CM | POA: Diagnosis not present

## 2016-09-25 ENCOUNTER — Encounter: Payer: Self-pay | Admitting: Emergency Medicine

## 2016-09-25 ENCOUNTER — Emergency Department: Payer: Medicare Other

## 2016-09-25 ENCOUNTER — Inpatient Hospital Stay
Admission: EM | Admit: 2016-09-25 | Discharge: 2016-10-05 | DRG: 191 | Disposition: A | Discharge status: Skilled Nursing Facility | Payer: Medicare Other | Attending: Internal Medicine | Admitting: Internal Medicine

## 2016-09-25 DIAGNOSIS — J9690 Respiratory failure, unspecified, unspecified whether with hypoxia or hypercapnia: Secondary | ICD-10-CM | POA: Diagnosis not present

## 2016-09-25 DIAGNOSIS — J440 Chronic obstructive pulmonary disease with acute lower respiratory infection: Secondary | ICD-10-CM | POA: Diagnosis present

## 2016-09-25 DIAGNOSIS — J9611 Chronic respiratory failure with hypoxia: Secondary | ICD-10-CM | POA: Diagnosis present

## 2016-09-25 DIAGNOSIS — J441 Chronic obstructive pulmonary disease with (acute) exacerbation: Principal | ICD-10-CM | POA: Diagnosis present

## 2016-09-25 DIAGNOSIS — Z9981 Dependence on supplemental oxygen: Secondary | ICD-10-CM

## 2016-09-25 DIAGNOSIS — K5900 Constipation, unspecified: Secondary | ICD-10-CM | POA: Diagnosis not present

## 2016-09-25 DIAGNOSIS — Z8249 Family history of ischemic heart disease and other diseases of the circulatory system: Secondary | ICD-10-CM | POA: Diagnosis not present

## 2016-09-25 DIAGNOSIS — Z79899 Other long term (current) drug therapy: Secondary | ICD-10-CM

## 2016-09-25 DIAGNOSIS — R001 Bradycardia, unspecified: Secondary | ICD-10-CM

## 2016-09-25 DIAGNOSIS — Z801 Family history of malignant neoplasm of trachea, bronchus and lung: Secondary | ICD-10-CM | POA: Diagnosis not present

## 2016-09-25 DIAGNOSIS — I25110 Atherosclerotic heart disease of native coronary artery with unstable angina pectoris: Secondary | ICD-10-CM | POA: Diagnosis not present

## 2016-09-25 DIAGNOSIS — R072 Precordial pain: Secondary | ICD-10-CM

## 2016-09-25 DIAGNOSIS — Z825 Family history of asthma and other chronic lower respiratory diseases: Secondary | ICD-10-CM | POA: Diagnosis not present

## 2016-09-25 DIAGNOSIS — K219 Gastro-esophageal reflux disease without esophagitis: Secondary | ICD-10-CM | POA: Diagnosis present

## 2016-09-25 DIAGNOSIS — F419 Anxiety disorder, unspecified: Secondary | ICD-10-CM | POA: Diagnosis present

## 2016-09-25 DIAGNOSIS — J209 Acute bronchitis, unspecified: Secondary | ICD-10-CM | POA: Diagnosis present

## 2016-09-25 DIAGNOSIS — Z823 Family history of stroke: Secondary | ICD-10-CM | POA: Diagnosis not present

## 2016-09-25 DIAGNOSIS — R845 Abnormal microbiological findings in specimens from respiratory organs and thorax: Secondary | ICD-10-CM | POA: Diagnosis present

## 2016-09-25 DIAGNOSIS — Z881 Allergy status to other antibiotic agents status: Secondary | ICD-10-CM

## 2016-09-25 DIAGNOSIS — R911 Solitary pulmonary nodule: Secondary | ICD-10-CM | POA: Diagnosis present

## 2016-09-25 DIAGNOSIS — Z9010 Acquired absence of unspecified breast and nipple: Secondary | ICD-10-CM

## 2016-09-25 DIAGNOSIS — Z9882 Breast implant status: Secondary | ICD-10-CM | POA: Diagnosis not present

## 2016-09-25 DIAGNOSIS — N200 Calculus of kidney: Secondary | ICD-10-CM | POA: Diagnosis not present

## 2016-09-25 DIAGNOSIS — R319 Hematuria, unspecified: Secondary | ICD-10-CM | POA: Diagnosis present

## 2016-09-25 DIAGNOSIS — E538 Deficiency of other specified B group vitamins: Secondary | ICD-10-CM | POA: Diagnosis not present

## 2016-09-25 DIAGNOSIS — E785 Hyperlipidemia, unspecified: Secondary | ICD-10-CM | POA: Diagnosis present

## 2016-09-25 DIAGNOSIS — Z955 Presence of coronary angioplasty implant and graft: Secondary | ICD-10-CM

## 2016-09-25 DIAGNOSIS — Z85828 Personal history of other malignant neoplasm of skin: Secondary | ICD-10-CM | POA: Diagnosis not present

## 2016-09-25 DIAGNOSIS — Z9911 Dependence on respirator [ventilator] status: Secondary | ICD-10-CM | POA: Diagnosis not present

## 2016-09-25 DIAGNOSIS — F329 Major depressive disorder, single episode, unspecified: Secondary | ICD-10-CM | POA: Diagnosis present

## 2016-09-25 DIAGNOSIS — Y92230 Patient room in hospital as the place of occurrence of the external cause: Secondary | ICD-10-CM | POA: Diagnosis not present

## 2016-09-25 DIAGNOSIS — Z7982 Long term (current) use of aspirin: Secondary | ICD-10-CM

## 2016-09-25 DIAGNOSIS — M6281 Muscle weakness (generalized): Secondary | ICD-10-CM | POA: Diagnosis not present

## 2016-09-25 DIAGNOSIS — R0602 Shortness of breath: Secondary | ICD-10-CM | POA: Diagnosis not present

## 2016-09-25 DIAGNOSIS — K222 Esophageal obstruction: Secondary | ICD-10-CM | POA: Diagnosis present

## 2016-09-25 DIAGNOSIS — I200 Unstable angina: Secondary | ICD-10-CM | POA: Diagnosis present

## 2016-09-25 DIAGNOSIS — I10 Essential (primary) hypertension: Secondary | ICD-10-CM | POA: Diagnosis not present

## 2016-09-25 DIAGNOSIS — Z9842 Cataract extraction status, left eye: Secondary | ICD-10-CM

## 2016-09-25 DIAGNOSIS — Z87891 Personal history of nicotine dependence: Secondary | ICD-10-CM

## 2016-09-25 DIAGNOSIS — R262 Difficulty in walking, not elsewhere classified: Secondary | ICD-10-CM | POA: Diagnosis not present

## 2016-09-25 DIAGNOSIS — R079 Chest pain, unspecified: Secondary | ICD-10-CM | POA: Diagnosis not present

## 2016-09-25 DIAGNOSIS — I252 Old myocardial infarction: Secondary | ICD-10-CM

## 2016-09-25 DIAGNOSIS — R42 Dizziness and giddiness: Secondary | ICD-10-CM | POA: Diagnosis not present

## 2016-09-25 DIAGNOSIS — Z888 Allergy status to other drugs, medicaments and biological substances status: Secondary | ICD-10-CM

## 2016-09-25 DIAGNOSIS — Z79891 Long term (current) use of opiate analgesic: Secondary | ICD-10-CM

## 2016-09-25 DIAGNOSIS — T45515A Adverse effect of anticoagulants, initial encounter: Secondary | ICD-10-CM | POA: Diagnosis not present

## 2016-09-25 DIAGNOSIS — Z7902 Long term (current) use of antithrombotics/antiplatelets: Secondary | ICD-10-CM

## 2016-09-25 DIAGNOSIS — J449 Chronic obstructive pulmonary disease, unspecified: Secondary | ICD-10-CM | POA: Diagnosis not present

## 2016-09-25 DIAGNOSIS — Z90710 Acquired absence of both cervix and uterus: Secondary | ICD-10-CM | POA: Diagnosis not present

## 2016-09-25 DIAGNOSIS — Z9841 Cataract extraction status, right eye: Secondary | ICD-10-CM

## 2016-09-25 DIAGNOSIS — Z7951 Long term (current) use of inhaled steroids: Secondary | ICD-10-CM

## 2016-09-25 DIAGNOSIS — I493 Ventricular premature depolarization: Secondary | ICD-10-CM | POA: Diagnosis present

## 2016-09-25 DIAGNOSIS — J9809 Other diseases of bronchus, not elsewhere classified: Secondary | ICD-10-CM | POA: Diagnosis not present

## 2016-09-25 LAB — TROPONIN I
Troponin I: <0.03 ng/mL (ref <0.03)
Troponin I: <0.03 ng/mL (ref <0.03)

## 2016-09-25 LAB — CBC WITH DIFFERENTIAL/PLATELET
BASOS ABS: 0.1 10*3/uL (ref 0–0.1)
Basophils Relative: 1 %
Eosinophils Absolute: 0.2 10*3/uL (ref 0–0.7)
Eosinophils Relative: 1 %
HCT: 45.8 % (ref 35.0–47.0)
HEMOGLOBIN: 14.8 g/dL (ref 12.0–16.0)
LYMPHS ABS: 2.7 10*3/uL (ref 1.0–3.6)
LYMPHS PCT: 21 %
MCH: 28.8 pg (ref 26.0–34.0)
MCHC: 32.3 g/dL (ref 32.0–36.0)
MCV: 89.1 fL (ref 80.0–100.0)
Monocytes Absolute: 0.8 10*3/uL (ref 0.2–0.9)
Monocytes Relative: 6 %
NEUTROS ABS: 9.2 10*3/uL — AB (ref 1.4–6.5)
NEUTROS PCT: 71 %
PLATELETS: 280 10*3/uL (ref 150–440)
RBC: 5.14 MIL/uL (ref 3.80–5.20)
RDW: 13.2 % (ref 11.5–14.5)
WBC: 13 10*3/uL — AB (ref 3.6–11.0)

## 2016-09-25 LAB — COMPREHENSIVE METABOLIC PANEL
ALBUMIN: 4 g/dL (ref 3.5–5.0)
ALK PHOS: 85 U/L (ref 38–126)
ALT: 22 U/L (ref 14–54)
ANION GAP: 10 (ref 5–15)
AST: 32 U/L (ref 15–41)
BUN: 18 mg/dL (ref 6–20)
CO2: 29 mmol/L (ref 22–32)
Calcium: 10.2 mg/dL (ref 8.9–10.3)
Chloride: 98 mmol/L — ABNORMAL LOW (ref 101–111)
Creatinine, Ser: 1.12 mg/dL — ABNORMAL HIGH (ref 0.44–1.00)
GFR calc Af Amer: 55 mL/min — ABNORMAL LOW (ref >60)
GFR calc non Af Amer: 48 mL/min — ABNORMAL LOW (ref >60)
GLUCOSE: 98 mg/dL (ref 65–99)
POTASSIUM: 3.5 mmol/L (ref 3.5–5.1)
SODIUM: 137 mmol/L (ref 135–145)
Total Bilirubin: 0.6 mg/dL (ref 0.3–1.2)
Total Protein: 7.9 g/dL (ref 6.5–8.1)

## 2016-09-25 LAB — PHOSPHORUS: Phosphorus: 3 mg/dL (ref 2.5–4.6)

## 2016-09-25 LAB — PROTIME-INR
INR: 0.95
Prothrombin Time: 12.7 seconds (ref 11.4–15.2)

## 2016-09-25 LAB — APTT: APTT: 28 s (ref 24–36)

## 2016-09-25 LAB — MAGNESIUM: Magnesium: 2 mg/dL (ref 1.7–2.4)

## 2016-09-25 MED ORDER — METHYLPREDNISOLONE SODIUM SUCC 40 MG IJ SOLR
INTRAMUSCULAR | Status: AC
  Filled 2016-09-25: qty 1

## 2016-09-25 MED ORDER — VENLAFAXINE HCL 75 MG PO TABS: 75.0000 mg | ORAL_TABLET | Freq: Every day | ORAL | Status: DC

## 2016-09-25 MED ORDER — FUROSEMIDE 40 MG PO TABS
20.0000 mg | ORAL_TABLET | Freq: Every day | ORAL | Status: DC
  Administered 2016-09-26 – 2016-09-30 (×5): 20 mg via ORAL
  Filled 2016-09-25 (×5): qty 1

## 2016-09-25 MED ORDER — BUDESONIDE 0.5 MG/2ML IN SUSP
0.5000 mg | Freq: Two times a day (BID) | RESPIRATORY_TRACT | Status: DC
  Administered 2016-09-25 – 2016-10-05 (×19): 0.5 mg via RESPIRATORY_TRACT
  Filled 2016-09-25 (×20): qty 2

## 2016-09-25 MED ORDER — ATORVASTATIN CALCIUM 20 MG PO TABS
20.0000 mg | ORAL_TABLET | Freq: Every day | ORAL | Status: DC
  Administered 2016-09-26 – 2016-10-04 (×9): 20 mg via ORAL
  Filled 2016-09-25 (×9): qty 1

## 2016-09-25 MED ORDER — ACETAMINOPHEN 650 MG RE SUPP: 650.0000 mg | Freq: Four times a day (QID) | RECTAL | Status: DC | PRN

## 2016-09-25 MED ORDER — IPRATROPIUM-ALBUTEROL 0.5-2.5 (3) MG/3ML IN SOLN
3.0000 mL | Freq: Four times a day (QID) | RESPIRATORY_TRACT | Status: DC
  Administered 2016-09-25 – 2016-10-05 (×39): 3 mL via RESPIRATORY_TRACT
  Filled 2016-09-25 (×39): qty 3

## 2016-09-25 MED ORDER — HEPARIN BOLUS VIA INFUSION
4000.0000 [IU] | Freq: Once | INTRAVENOUS | Status: AC
  Administered 2016-09-25: 4000 [IU] via INTRAVENOUS
  Filled 2016-09-25: qty 4000

## 2016-09-25 MED ORDER — VENLAFAXINE HCL 37.5 MG PO TABS
37.5000 mg | ORAL_TABLET | Freq: Every day | ORAL | Status: DC
  Administered 2016-09-25 – 2016-10-04 (×10): 37.5 mg via ORAL
  Filled 2016-09-25 (×11): qty 1

## 2016-09-25 MED ORDER — NITROGLYCERIN 2 % TD OINT
0.5000 [in_us] | TOPICAL_OINTMENT | Freq: Four times a day (QID) | TRANSDERMAL | Status: DC
  Administered 2016-09-25 – 2016-09-27 (×6): 0.5 [in_us] via TOPICAL
  Filled 2016-09-25 (×6): qty 1

## 2016-09-25 MED ORDER — MECLIZINE HCL 25 MG PO TABS
25.0000 mg | ORAL_TABLET | Freq: Three times a day (TID) | ORAL | Status: DC | PRN
  Administered 2016-09-25: 25 mg via ORAL
  Filled 2016-09-25 (×2): qty 1

## 2016-09-25 MED ORDER — VENLAFAXINE HCL 37.5 MG PO TABS
75.0000 mg | ORAL_TABLET | Freq: Every morning | ORAL | Status: DC
  Administered 2016-09-26 – 2016-10-05 (×10): 75 mg via ORAL
  Filled 2016-09-25 (×11): qty 2

## 2016-09-25 MED ORDER — ACETAMINOPHEN 325 MG PO TABS
650.0000 mg | ORAL_TABLET | Freq: Four times a day (QID) | ORAL | Status: DC | PRN
  Administered 2016-09-27 – 2016-10-01 (×3): 650 mg via ORAL
  Filled 2016-09-25 (×3): qty 2

## 2016-09-25 MED ORDER — ASPIRIN EC 81 MG PO TBEC
81.0000 mg | DELAYED_RELEASE_TABLET | Freq: Every day | ORAL | Status: DC
  Administered 2016-09-26 – 2016-10-05 (×10): 81 mg via ORAL
  Filled 2016-09-25 (×10): qty 1

## 2016-09-25 MED ORDER — METHYLPREDNISOLONE SODIUM SUCC 40 MG IJ SOLR
40.0000 mg | Freq: Every day | INTRAMUSCULAR | Status: DC
  Administered 2016-09-25 – 2016-09-28 (×4): 40 mg via INTRAVENOUS
  Filled 2016-09-25 (×3): qty 1

## 2016-09-25 MED ORDER — ALPRAZOLAM 0.5 MG PO TABS
0.5000 mg | ORAL_TABLET | Freq: Three times a day (TID) | ORAL | Status: DC | PRN
  Administered 2016-09-25 – 2016-10-05 (×27): 0.5 mg via ORAL
  Filled 2016-09-25 (×28): qty 1

## 2016-09-25 MED ORDER — NITROGLYCERIN 0.4 MG SL SUBL: 0.4000 mg | SUBLINGUAL_TABLET | SUBLINGUAL | Status: DC | PRN

## 2016-09-25 MED ORDER — HEPARIN (PORCINE) IN NACL 100-0.45 UNIT/ML-% IJ SOLN
800.0000 [IU]/h | INTRAMUSCULAR | Status: DC
  Administered 2016-09-25: 800 [IU]/h via INTRAVENOUS
  Filled 2016-09-25 (×2): qty 250

## 2016-09-25 MED ORDER — CLOPIDOGREL BISULFATE 75 MG PO TABS
75.0000 mg | ORAL_TABLET | Freq: Every day | ORAL | Status: DC
  Administered 2016-09-26 – 2016-10-05 (×10): 75 mg via ORAL
  Filled 2016-09-25 (×10): qty 1

## 2016-09-25 NOTE — ED Notes (Addendum)
Pt assisted up to toilet. Pt HR 43 at time of assisting pt back into bed. Pt stated she felt her HR go down. Code Cart to pt bedside. Pads placed on pt per MD [facility]. Pt resting comfortable with no complaints at this time.

## 2016-09-25 NOTE — ED Notes (Signed)
Pt given water. [place] per MD

## 2016-09-25 NOTE — ED Provider Notes (Signed)
[facility] Emergency Department Provider Note  ____________________________________________  Time seen: Approximately 4:06 PM  I have reviewed the triage vital signs and the nursing notes.   HISTORY  Chief Complaint Chest Pain    HPI [name] is a 76 y.o. female who complains of episodes of central chest heaviness which is nonradiating but associated with shortness of breath and dizziness. No diaphoresis or vomiting. Not exertional. No specific aggravating or alleviating factors. She reports that this started yesterday and is occurring about once an hour lasting for a few minutes at a time. When this happened she checked her blood pressure and found that her blood pressure is normal but her heart rate is in the 40s. She has no history of this. In the electronic medical record I see that she saw her cardiologist Dr. Saralyn Pilar about 6 weeks ago where she had been complaining of fast heart rate and had a Holter that showed frequent PVCs.     Past Medical History:  Diagnosis Date  . Anginal pain [facility])    see dr [name]  "Spams"  . Anxiety   . Arthritis   . Cancer (HCC)    side of head- skin cancer, squamous cell ca on left foot.  . Complication of anesthesia   . Constipation   . COPD (chronic obstructive pulmonary disease) ([place])   . COPD, severe 07/29/98  . Coronary artery disease 01/22/99   Non Q-wave MI, stent RCA  . Depression   . Depression   . Dysrhythmia    Palpations at times. last time 10/29 ish 2014  . Esophageal dilatation    Pt needs appple sauce to take meds.  . Esophageal stricture    PT needs apple sauce to take meds  . GERD (gastroesophageal reflux disease) 08/29/98   severe-no meds now  . Head injury, acute, with loss of consciousness ([place]) 11/2012   unsure how long  . History of blood transfusion   . History of blurry vision 05/06-05/10/2009   Hospital [facility] CP R/O'D Blurry vision, smoking  . History of CT scan of head  08/02/09   w/o mild age appropr atrophy  . History of ETT 08/03/09   Myoview Normal  . History of ETT 05/1999   Cardiolite pos ETT, neg Cardiolite  . History of ETT 09/02/01   Normal  . History of ETT 04/28/2006   Myoview normal EF 83%  . History of ETT 04/10/11   normal EF and no ischemia per Dr. Saralyn Pilar  . Hx of cardiac catheterization 08/29/01   60% RCA 0/w 20-30% lesions  . Hx of cardiac catheterization 01/22/99   w/Stent 90% RCA lesion  . Hypertension    03/30/00  . Mental disorder   . On home oxygen therapy    as needed-has a travel tank  . Pneumothorax 2/14  . PONV (postoperative nausea and vomiting)    ad breast remocved and see was under anesthesia a long time  . Shortness of breath   . Status post aortic coarctation stent placement      Patient Active Problem List   Diagnosis Date Noted  . Dizziness 05/14/2016  . Atrophic vaginitis 02/28/2015  . Gross hematuria 02/26/2015  . Radicular pain in right arm 11/21/2014  . Pupil asymmetry 11/16/2014  . Osteoporosis 11/16/2014  . Senile purpura ([place]) 11/16/2014  . Right hand pain 11/16/2014  . Advance care planning 08/15/2014  . Other specified counseling 08/15/2014  . Constipation 06/21/2014  . NSTEMI (non-ST elevated  myocardial infarction) ([place]) 12/17/2013  . Acute subendocardial infarction ([place]) 12/17/2013  . History of cardiac catheterization 09/15/2013  . Chest pain on exertion 08/25/2013  . Medicare annual wellness visit, subsequent 07/09/2013  . Beat, premature ventricular 06/26/2013  . Awareness of heartbeats 06/26/2013  . Arteriosclerosis of coronary artery 01/05/2013  . Pulmonary emphysema ([place]) 01/05/2013  . Acid reflux 01/05/2013  . H/O pneumothorax 01/05/2013  . Neck pain 08/31/2012  . Anxiety and depression 07/11/2012  . Dysthymia 07/11/2012  . Esophageal dilatation   . Esophageal stricture   . Nutritional marasmus ([place]) 06/22/2012  . COPD with acute exacerbation ([place]) 06/17/2012  . Chronic  obstructive pulmonary disease with acute exacerbation ([place]) 06/17/2012  . Lung nodule 04/17/2012  . Edema of foot 04/01/2012  . Paresthesia 10/27/2011  . Atypical chest pain 05/13/2011  . HLD (hyperlipidemia) 02/26/2011  . History of mammogram 02/26/2011  . Vertigo 02/18/2011  . B12 deficiency 01/31/2009  . HEADACHE 10/22/2006  . PREMATURE VENTRICULAR CONTRACTIONS, FREQUENT 07/19/2006  . HYPERTENSION 03/30/2000  . Essential (primary) hypertension 03/30/2000  . CORONARY ARTERY DISEASE 01/22/1999  . Atherosclerosis of coronary artery 01/22/1999  . GERD 08/29/1998  . Chronic obstructive pulmonary disease ([place]) 07/29/1998     Past Surgical History:  Procedure Laterality Date  . ABDOMINAL HYSTERECTOMY    . BACK SURGERY    . BREAST SURGERY     implant removal, R breast  . CAROTID STENT Right 2000  . CATARACT EXTRACTION Bilateral    2016  . COLONOSCOPY    . ESOPHAGEAL DILATION  06/00   EGD  . LAMINECTOMY  1976   Disc Removal X 2 Lumbar  . MASTECTOMY  03/1976   Bilateral due FCBD with implants [facility])  . OOPHORECTOMY    . TISSUE EXPANDER PLACEMENT Right 02/03/2013   Procedure: REMOVAL OF RIGHT BREAST IMPLANT AND IMPLANT MATERIAL  CAPSULECTOMY;  Surgeon: Irene Limbo, MD;  Location: [place];  Service: Plastics;  Laterality: Right;     Prior to Admission medications   Medication Sig Start Date End Date Taking? Authorizing Provider  albuterol [facility] HFA) 108 (90 Base) MCG/ACT inhaler INHALE TWO PUFFS BY MOUTH EVERY 6 HOURS AS NEEDED 08/13/15   Tonia Ghent, MD  ALPRAZolam Duanne Moron) 0.5 MG tablet TAKE 1 TABLET BY MOUTH 3 TIMES A DAY AS NEEDED 08/30/16   Tonia Ghent, MD  aspirin EC 81 MG tablet Take 81 mg by mouth daily.    [provider]  atorvastatin (LIPITOR) 20 MG tablet TAKE 1 TABLET BY MOUTH ONCE A DAY 12/10/15   Tonia Ghent, MD  budesonide-formoterol [facility]) 160-4.5 MCG/ACT inhaler Inhale 2 puffs into the lungs 2 (two) times daily. 06/18/14   Tonia Ghent, MD  clopidogrel (PLAVIX) 75 MG tablet TAKE 1 TABLET BY MOUTH ONCE A DAY 12/10/15   Tonia Ghent, MD  furosemide (LASIX) 20 MG tablet Take 20 mg by mouth daily.    [provider]  meclizine (ANTIVERT) 25 MG tablet Take 1 tablet (25 mg total) by mouth 3 (three) times daily as needed for dizziness. 05/15/16   Bettey Costa, MD  metoprolol succinate (TOPROL-XL) 50 MG 24 hr tablet Take 1 tablet by mouth daily. 05/04/16   [provider]  nitroGLYCERIN (NITROSTAT) 0.4 MG SL tablet Place 1 tablet (0.4 mg total) under the tongue every 5 (five) minutes as needed for chest pain (max 3 doses in 15 min). 12/29/13   Tonia Ghent, MD  polyethylene glycol powder [facility]) powder Take  by mouth. 06/20/14   [provider]  tiotropium (SPIRIVA HANDIHALER) 18 MCG inhalation capsule INHALE ONE DOSE ONCE DAILY 07/04/14   [provider]  venlafaxine (EFFEXOR) 37.5 MG tablet TAKE 2 TABLETS BY MOUTH IN THE MORNING AND 1 TABLET IN THE EVENING 09/14/16   Tonia Ghent, MD     Allergies Amoxicillin; Doxycycline; and Sertraline hcl   Family History  Problem Relation Age of Onset  . Stroke Mother   . Heart disease Mother        MI  . Cancer Father        Lung  . COPD Brother   . Cancer Brother        Lung with mets 2011  . Heart disease Brother        CAD  . Heart disease Sister        cirrhosis due heart disease  . Cirrhosis Sister   . Colon cancer Neg Hx   . Breast cancer Neg Hx     Social History Social History  Substance Use Topics  . Smoking status: Former Smoker    Packs/day: 0.40    Years: 50.00    Types: Cigarettes    Quit date: 02/28/2012  . Smokeless tobacco: Never Used     Comment: 1/4 PPD as of 2012  . Alcohol use No    Review of Systems  Constitutional:   No fever or chills.  ENT:   No sore throat. No rhinorrhea. Cardiovascular:   Positive chest pain as above without syncope.Marland Kitchen Respiratory:   No dyspnea or  cough. Gastrointestinal:   Negative for abdominal pain, vomiting and diarrhea.  Musculoskeletal:   Negative for focal pain or swelling. No falls All other systems reviewed and are negative except as documented above in ROS and HPI.  ____________________________________________   PHYSICAL EXAM:  VITAL SIGNS: ED Triage Vitals  Enc Vitals Group     BP 09/25/16 1426 (!) 174/79     Pulse Rate 09/25/16 1426 88     Resp 09/25/16 1426 16     Temp 09/25/16 1426 97.9 F (36.6 C)     Temp Source 09/25/16 1426 Oral     SpO2 09/25/16 1426 97 %     Weight --      Height --      Head Circumference --      Peak Flow --      Pain Score 09/25/16 1422 2     Pain Loc --      Pain Edu? --      Excl. in [place]? --     Vital signs reviewed, nursing assessments reviewed.   Constitutional:   Alert and oriented. Well appearing and in no distress. Eyes:   No scleral icterus.  EOMI. No nystagmus. No conjunctival pallor. PERRL. ENT   Head:   Normocephalic and atraumatic.   Nose:   No congestion/rhinnorhea.    Mouth/Throat:   MMM, no pharyngeal erythema. No peritonsillar mass.    Neck:   No meningismus. Full ROM Hematological/Lymphatic/Immunilogical:   No cervical lymphadenopathy. Cardiovascular:   RRR with frequent ectopic beat. Symmetric bilateral radial and DP pulses.  No murmurs.  Respiratory:   Normal respiratory effort without tachypnea/retractions. Diminished air movement in right lung. No wheezes/rales/rhonchi. Gastrointestinal:   Soft and nontender. Non distended. There is no CVA tenderness.  No rebound, rigidity, or guarding. Genitourinary:   deferred Musculoskeletal:   Normal range of motion in all extremities. No joint effusions.  No lower extremity  tenderness.  No edema. Neurologic:   Normal speech and language.  Motor grossly intact. No gross focal neurologic deficits are appreciated.  Skin:    Skin is warm, dry and intact. No rash noted.  No petechiae, purpura, or  bullae.  ____________________________________________    LABS (pertinent positives/negatives) (all labs ordered are listed, but only abnormal results are displayed) Labs Reviewed  COMPREHENSIVE METABOLIC PANEL - Abnormal; Notable for the following:       Result Value   Chloride 98 (*)    Creatinine, Ser 1.12 (*)    GFR calc non Af Amer 48 (*)    GFR calc Af Amer 55 (*)    All other components within normal limits  CBC WITH DIFFERENTIAL/PLATELET - Abnormal; Notable for the following:    WBC 13.0 (*)    Neutro Abs 9.2 (*)    All other components within normal limits  TROPONIN I  PROTIME-INR  APTT  MAGNESIUM  PHOSPHORUS   ____________________________________________   EKG  Interpreted by me Sinus rhythm rate of 90, normal axis and intervals. Poor R referral physician in anterior precordial leads. Normal ST segments. Normal T waves. Unchanged from 02/02/2017  ____________________________________________    RADIOLOGY  Dg Chest 2 View  Result Date: 09/25/2016 CLINICAL DATA:  Chest heaviness and shortness of Breath EXAM: CHEST  2 VIEW COMPARISON:  06/15/2016 FINDINGS: Cardiac shadow is stable. Left breast implant is noted. Aortic calcifications are again seen. Diffuse interstitial changes are seen without focal infiltrate or sizable effusion. No acute bony abnormality is noted. IMPRESSION: Chronic changes without acute abnormality. Electronically Signed   By: Inez Catalina M.D.   On: 09/25/2016 15:14   Ct Head Wo Contrast  Result Date: 09/25/2016 CLINICAL DATA:  Chest heaviness beginning yesterday.  Dizziness. EXAM: CT HEAD WITHOUT CONTRAST TECHNIQUE: Contiguous axial images were obtained from the base of the skull through the vertex without intravenous contrast. COMPARISON:  05/14/2016 FINDINGS: Brain: Age related atrophy. Mild to moderate chronic small-vessel ischemic change of the deep white matter. No sign of acute infarction, mass lesion, hemorrhage, hydrocephalus or  extra-axial collection. Vascular: There is atherosclerotic calcification of the major vessels at the base of the brain. Skull: Negative Sinuses/Orbits: Clear/normal. Other: None significant IMPRESSION: Atrophy and chronic small-vessel ischemic changes. No acute finding by CT. No finding to explain dizziness. Electronically Signed   By: Nelson Chimes M.D.   On: 09/25/2016 14:55    ____________________________________________   PROCEDURES Procedures  ____________________________________________   INITIAL IMPRESSION / ASSESSMENT AND PLAN / ED COURSE  Pertinent labs & imaging results that were available during my care of the patient were reviewed by me and considered in my medical decision making (see chart for details).  Patient presents with episodes of bradycardia producing precordial chest heaviness and dizziness. has frequent PVCs on monitor. Consistent with symptomatically bradycardia, concern for paroxysmal unstable rhythm . We'll keep patient on telemetry monitor, observed in hospital for further serial troponins and cardiology evaluation. Her present pain is resolved.    ____________________________________________   FINAL CLINICAL IMPRESSION(S) / ED DIAGNOSES  Final diagnoses:  Precordial pain  Symptomatic bradycardia      New Prescriptions   No medications on file     Portions of this note were generated with dragon dictation software. Dictation errors may occur despite best attempts at proofreading.    Carrie Mew, MD 09/25/16 1616

## 2016-09-25 NOTE — Progress Notes (Signed)
ANTICOAGULATION CONSULT NOTE - Initial Consult  Pharmacy Consult for Heparin drip Indication: chest pain/ACS  Allergies  Allergen Reactions  . Amoxicillin Shortness Of Breath  . Doxycycline Nausea Only and Other (See Comments)    Dizzy Reaction:  Dizziness   . Sertraline Hcl Nausea And Vomiting    REACTION: trembling    Patient Measurements: Weight: 156 lb (70.8 kg) Heparin Dosing Weight: 70.8 kg  Vital Signs: Temp: 97.9 F (36.6 C) (06/29 1426) Temp Source: Oral (06/29 1426) BP: 147/63 (06/29 1730) Pulse Rate: 69 (06/29 1730)  Labs:  Recent Labs  09/25/16 1437  HGB 14.8  HCT 45.8  PLT 280  APTT 28  LABPROT 12.7  INR 0.95  CREATININE 1.12*  TROPONINI <0.03    Estimated Creatinine Clearance: 43.5 mL/min (A) (by C-G formula based on SCr of 1.12 mg/dL (H)).   Assessment: 76 yo female here with chest pain starting on heparin drip.   Hgb 14.8, Plt 280, aPTT 28, INR 0.95  Goal of Therapy:  Heparin level 0.3-0.7 units/ml Monitor platelets by anticoagulation protocol: Yes   Plan:  Heparin 4000 units IV x1 then heparin drip at 800 units/hr (=8 ml/hr) Heparin level 8h after start of infusion - for 0300 in AM CBC in AM  Pharmacy will continue to follow.    Rocky Morel 09/25/2016,5:47 PM

## 2016-09-25 NOTE — ED Notes (Signed)
Family updated that pt would be admitted to hospital

## 2016-09-25 NOTE — ED Notes (Signed)
MD at bedside. 

## 2016-09-25 NOTE — H&P (Signed)
[place] [facility] [place] NAME: Mckenzie Mclaughlin    MR#:  [id number]  DATE OF BIRTH:  1941-03-02  DATE OF ADMISSION:  09/25/2016  PRIMARY CARE PHYSICIAN: Tonia Ghent, MD   REQUESTING/REFERRING PHYSICIAN: Dr Maisie Fus  CHIEF COMPLAINT:   Chief Complaint  Patient presents with  . Chest Pain    HISTORY OF PRESENT ILLNESS:  Mckenzie Mclaughlin  is a 76 y.o. female with a known history of COPD on home oxygen and CAD. She presents with chest pain lasting hours at a time. She also states that her heart rates have been dropping down into the 40s and she's not feeling well. She describes her chest pain as a heaviness and chest 2 out of 10 in intensity. Associated with shortness of breath and cough and dizziness. No nausea or diaphoresis. In the ER, her first cardiac enzyme is negative. She does have some flipped T waves laterally on EKG. Hospitalist services were contacted for further evaluation. Patient states that she's unable to have a stress test because the chemical test would cause her to have anaphylactic reaction and she is unable to walk on the treadmill.  PAST MEDICAL HISTORY:   Past Medical History:  Diagnosis Date  . Anginal pain [facility])    see dr [name]  "Spams"  . Anxiety   . Arthritis   . Cancer (HCC)    side of head- skin cancer, squamous cell ca on left foot.  . Complication of anesthesia   . Constipation   . COPD (chronic obstructive pulmonary disease) ([place])   . COPD, severe 07/29/98  . Coronary artery disease 01/22/99   Non Q-wave MI, stent RCA  . Depression   . Depression   . Dysrhythmia    Palpations at times. last time 10/29 ish 2014  . Esophageal dilatation    Pt needs appple sauce to take meds.  . Esophageal stricture    PT needs apple sauce to take meds  . GERD (gastroesophageal reflux disease) 08/29/98   severe-no meds now  . Head injury, acute, with loss of consciousness ([place]) 11/2012   unsure how  long  . History of blood transfusion   . History of blurry vision 05/06-05/10/2009   Hospital [facility] CP R/O'D Blurry vision, smoking  . History of CT scan of head 08/02/09   w/o mild age appropr atrophy  . History of ETT 08/03/09   Myoview Normal  . History of ETT 05/1999   Cardiolite pos ETT, neg Cardiolite  . History of ETT 09/02/01   Normal  . History of ETT 04/28/2006   Myoview normal EF 83%  . History of ETT 04/10/11   normal EF and no ischemia per Dr. Saralyn Pilar  . Hx of cardiac catheterization 08/29/01   60% RCA 0/w 20-30% lesions  . Hx of cardiac catheterization 01/22/99   w/Stent 90% RCA lesion  . Hypertension    03/30/00  . Mental disorder   . On home oxygen therapy    as needed-has a travel tank  . Pneumothorax 2/14  . PONV (postoperative nausea and vomiting)    ad breast remocved and see was under anesthesia a long time  . Shortness of breath   . Status post aortic coarctation stent placement     PAST SURGICAL HISTORY:   Past Surgical History:  Procedure Laterality Date  . ABDOMINAL HYSTERECTOMY    . BACK SURGERY    . BREAST SURGERY     implant removal,  R breast  . CAROTID STENT Right 2000  . CATARACT EXTRACTION Bilateral    2016  . COLONOSCOPY    . ESOPHAGEAL DILATION  06/00   EGD  . LAMINECTOMY  1976   Disc Removal X 2 Lumbar  . MASTECTOMY  03/1976   Bilateral due FCBD with implants [facility])  . OOPHORECTOMY    . TISSUE EXPANDER PLACEMENT Right 02/03/2013   Procedure: REMOVAL OF RIGHT BREAST IMPLANT AND IMPLANT MATERIAL  CAPSULECTOMY;  Surgeon: Irene Limbo, MD;  Location: [facility];  Service: Plastics;  Laterality: Right;    SOCIAL HISTORY:   Social History  Substance Use Topics  . Smoking status: Former Smoker    Packs/day: 0.40    Years: 50.00    Types: Cigarettes    Quit date: 02/28/2012  . Smokeless tobacco: Never Used     Comment: 1/4 PPD as of 2012  . Alcohol use No    FAMILY HISTORY:   Family History  Problem Relation Age of Onset   . Stroke Mother   . Heart disease Mother        MI  . Heart failure Mother   . Cancer Father        Lung  . COPD Brother   . Cancer Brother        Lung with mets 2011  . Heart disease Brother        CAD  . Heart disease Sister        cirrhosis due heart disease  . Cirrhosis Sister   . Colon cancer Neg Hx   . Breast cancer Neg Hx     DRUG ALLERGIES:   Allergies  Allergen Reactions  . Amoxicillin Shortness Of Breath  . Doxycycline Nausea Only and Other (See Comments)    Dizzy Reaction:  Dizziness   . Sertraline Hcl Nausea And Vomiting    REACTION: trembling    REVIEW OF SYSTEMS:  CONSTITUTIONAL: No fever. Positive for weakness.  EYES: No blurred or double vision.  EARS, NOSE, AND THROAT: No tinnitus or ear pain. Occasional sore throat. Pill dysphagia RESPIRATORY: Positive for cough and shortness of breath. no  wheezing or hemoptysis.  CARDIOVASCULAR: Positive for chest pain, no orthopnea, edema.  GASTROINTESTINAL: No nausea, vomiting, diarrhea or abdominal pain. No blood in bowel movements. Positive for constipation  GENITOURINARY: No dysuria.  occasional hematuria.  ENDOCRINE: No polyuria, nocturia,  HEMATOLOGY: No anemia.  positive easy bruising or bleeding SKIN: No rash or lesion. MUSCULOSKELETAL: No joint pain or arthritis.   NEUROLOGIC: No tingling, numbness, weakness.  PSYCHIATRY: No anxiety or depression.   MEDICATIONS AT HOME:   Prior to Admission medications   Medication Sig Start Date End Date Taking? Authorizing Provider  albuterol (PROAIR HFA) 108 (90 Base) MCG/ACT inhaler INHALE TWO PUFFS BY MOUTH EVERY 6 HOURS AS NEEDED 08/13/15  Yes Tonia Ghent, MD  ALPRAZolam Duanne Moron) 0.5 MG tablet TAKE 1 TABLET BY MOUTH 3 TIMES A DAY AS NEEDED 08/30/16  Yes Tonia Ghent, MD  aspirin EC 81 MG tablet Take 81 mg by mouth daily.   Yes [provider]  atorvastatin (LIPITOR) 20 MG tablet TAKE 1 TABLET BY MOUTH ONCE A DAY 12/10/15  Yes Tonia Ghent, MD   budesonide-formoterol [facility]) 160-4.5 MCG/ACT inhaler Inhale 2 puffs into the lungs 2 (two) times daily. 06/18/14  Yes Tonia Ghent, MD  clopidogrel (PLAVIX) 75 MG tablet TAKE 1 TABLET BY MOUTH ONCE A DAY 12/10/15  Yes Tonia Ghent, MD  furosemide (LASIX) 20 MG tablet Take 20 mg by mouth daily.   Yes [provider]  metoprolol succinate (TOPROL-XL) 50 MG 24 hr tablet Take 1 tablet by mouth daily. 05/04/16  Yes [provider]  tiotropium (SPIRIVA HANDIHALER) 18 MCG inhalation capsule INHALE ONE DOSE ONCE DAILY 07/04/14  Yes [provider]  venlafaxine (EFFEXOR) 37.5 MG tablet TAKE 2 TABLETS BY MOUTH IN THE MORNING AND 1 TABLET IN THE EVENING 09/14/16  Yes Tonia Ghent, MD  meclizine (ANTIVERT) 25 MG tablet Take 1 tablet (25 mg total) by mouth 3 (three) times daily as needed for dizziness. 05/15/16   Bettey Costa, MD  nitroGLYCERIN (NITROSTAT) 0.4 MG SL tablet Place 1 tablet (0.4 mg total) under the tongue every 5 (five) minutes as needed for chest pain (max 3 doses in 15 min). 12/29/13   Tonia Ghent, MD  polyethylene glycol powder [facility]) powder Take by mouth. 06/20/14   [provider]      VITAL SIGNS:  Blood pressure (!) 170/82, pulse 73, temperature 97.9 F (36.6 C), temperature source Oral, resp. rate 17, SpO2 97 %.  PHYSICAL EXAMINATION:  GENERAL:  76 y.o.-year-old patient lying in the bed with no acute distress.  EYES: Pupils equal, round, reactive to light and accommodation. No scleral icterus. Extraocular muscles intact.  HEENT: Head atraumatic, normocephalic. Oropharynx and nasopharynx clear.  NECK:  Supple, no jugular venous distention. No thyroid enlargement, no tenderness.  LUNGS: Decrease scar d breath sounds bilaterally. Scattered Wheezing.  no rales,rhonchi or crepitation. No use of accessory muscles of respiration.  CARDIOVASCULAR: S1, S2 normal. No murmurs, rubs, or gallops. Reproducible component to chest pain on  the left side  ABDOMEN: Soft, nontender, nondistended. Bowel sounds present. No organomegaly or mass.  EXTREMITIES: No pedal edema, cyanosis, or clubbing.  NEUROLOGIC: Cranial nerves II through XII are intact. Muscle strength 5/5 in all extremities. Sensation intact. Gait not checked.  PSYCHIATRIC: The patient is alert and oriented x 3.  SKIN: No rash, lesion, or ulcer. Bruises on the upper extremities  LABORATORY PANEL:   CBC  Recent Labs Lab 09/25/16 1437  WBC 13.0*  HGB 14.8  HCT 45.8  PLT 280   ------------------------------------------------------------------------------------------------------------------  Chemistries   Recent Labs Lab 09/25/16 1437  NA 137  K 3.5  CL 98*  CO2 29  GLUCOSE 98  BUN 18  CREATININE 1.12*  CALCIUM 10.2  MG 2.0  AST 32  ALT 22  ALKPHOS 85  BILITOT 0.6   ------------------------------------------------------------------------------------------------------------------  Cardiac Enzymes  Recent Labs Lab 09/25/16 1437  TROPONINI <0.03   ------------------------------------------------------------------------------------------------------------------  RADIOLOGY:  Dg Chest 2 View  Result Date: 09/25/2016 CLINICAL DATA:  Chest heaviness and shortness of Breath EXAM: CHEST  2 VIEW COMPARISON:  06/15/2016 FINDINGS: Cardiac shadow is stable. Left breast implant is noted. Aortic calcifications are again seen. Diffuse interstitial changes are seen without focal infiltrate or sizable effusion. No acute bony abnormality is noted. IMPRESSION: Chronic changes without acute abnormality. Electronically Signed   By: Inez Catalina M.D.   On: 09/25/2016 15:14   Ct Head Wo Contrast  Result Date: 09/25/2016 CLINICAL DATA:  Chest heaviness beginning yesterday.  Dizziness. EXAM: CT HEAD WITHOUT CONTRAST TECHNIQUE: Contiguous axial images were obtained from the base of the skull through the vertex without intravenous contrast. COMPARISON:  05/14/2016  FINDINGS: Brain: Age related atrophy. Mild to moderate chronic small-vessel ischemic change of the deep white matter. No sign of acute infarction, mass lesion, hemorrhage, hydrocephalus or extra-axial collection.  Vascular: There is atherosclerotic calcification of the major vessels at the base of the brain. Skull: Negative Sinuses/Orbits: Clear/normal. Other: None significant IMPRESSION: Atrophy and chronic small-vessel ischemic changes. No acute finding by CT. No finding to explain dizziness. Electronically Signed   By: Nelson Chimes M.D.   On: 09/25/2016 14:55    EKG:   Normal sinus rhythm 90 bpm, left atrial enlargement  IMPRESSION AND PLAN:   1. Unstable angina with chest pain and bradycardia episodes. Started on heparin drip. Serial cardiac enzymes. Continue aspirin and Plavix. Add nitroglycerin patch. Hold metoprolol for right now with these bradycardia episodes. Cardiology consultation. Patient states that she's unable to get a stress test because of reaction to the chemical and she is unable to walk on a treadmill. 2. COPD exacerbation and reproducible chest discomfort. Start Solu-Medrol 40 mg daily nebulizer treatments with budesonide nebulizer. Chronic respiratory failure on oxygen 2 L 3. Essential hypertension. Hold Toprol at this time with bradycardia but continue other medications 4. Depression on Effexor 5. Hyperlipidemia unspecified on atorvastatin   All the records are reviewed and case discussed with ED provider. Management plans discussed with the patient, family and they are in agreement.  CODE STATUS: Full code  TOTAL TIME TAKING CARE OF THIS PATIENT:  50 minutes.    Loletha Grayer M.D on 09/25/2016 at 5:22 PM  Between 7am to 6pm - Pager - [phone]  After 6pm call admission pager [phone]  [facility] Office  [phone]  CC: Primary care physician; Tonia Ghent, MD

## 2016-09-25 NOTE — ED Triage Notes (Signed)
Pt to ED via POV for chest heaviness that started yesterday. Pt states that the pain is 2/10. Pt denies radiation of pain. Pt denies shortness of breath any more than normal. Pt states that she has slight dizziness when she gets up. Pt states that this is a new symptom that started about 2 hour PTA. Pt does not appear to be in any distress at this time. Skin is warm and dry, breathing is equal and unlabored. Color is WNL.

## 2016-09-25 NOTE — Progress Notes (Signed)
Patient arrived on unit via stretcher from ED. Patient is  AAOx3. IV intact. Patient and family oriented to room. Patient denies pain and SOB at this time.  Cardiac monitor placed on patient. Bed in lowest position, call bell in place.

## 2016-09-26 LAB — CBC
HCT: 41.9 % (ref 35.0–47.0)
Hemoglobin: 14.2 g/dL (ref 12.0–16.0)
MCH: 29.3 pg (ref 26.0–34.0)
MCHC: 33.9 g/dL (ref 32.0–36.0)
MCV: 86.4 fL (ref 80.0–100.0)
PLATELETS: 264 10*3/uL (ref 150–440)
RBC: 4.85 MIL/uL (ref 3.80–5.20)
RDW: 13.1 % (ref 11.5–14.5)
WBC: 7.4 10*3/uL (ref 3.6–11.0)

## 2016-09-26 LAB — BASIC METABOLIC PANEL
Anion gap: 7 (ref 5–15)
BUN: 16 mg/dL (ref 6–20)
CALCIUM: 9.4 mg/dL (ref 8.9–10.3)
CHLORIDE: 101 mmol/L (ref 101–111)
CO2: 28 mmol/L (ref 22–32)
CREATININE: 0.74 mg/dL (ref 0.44–1.00)
GFR calc non Af Amer: >60 mL/min (ref >60)
Glucose, Bld: 148 mg/dL — ABNORMAL HIGH (ref 65–99)
Potassium: 4.2 mmol/L (ref 3.5–5.1)
SODIUM: 136 mmol/L (ref 135–145)

## 2016-09-26 LAB — HEPARIN LEVEL (UNFRACTIONATED)
HEPARIN UNFRACTIONATED: 0.65 [IU]/mL (ref 0.30–0.70)
HEPARIN UNFRACTIONATED: 0.54 [IU]/mL (ref 0.30–0.70)

## 2016-09-26 NOTE — Progress Notes (Signed)
ANTICOAGULATION CONSULT NOTE - Initial Consult  Pharmacy Consult for Heparin drip Indication: chest pain/ACS  Allergies  Allergen Reactions  . Amoxicillin Shortness Of Breath  . Doxycycline Nausea Only and Other (See Comments)    Dizzy Reaction:  Dizziness   . Sertraline Hcl Nausea And Vomiting    REACTION: trembling    Patient Measurements: Height: 5\' 7"  (170.2 cm) Weight: 145 lb 8 oz (66 kg) IBW/kg (Calculated) : 61.6 Heparin Dosing Weight: 70.8 kg  Vital Signs: Temp: 97.5 F (36.4 C) (06/29 1951) Temp Source: Oral (06/29 1951) BP: 132/54 (06/29 2304) Pulse Rate: 82 (06/29 2304)  Labs:  Recent Labs  09/25/16 1437 09/25/16 1824 09/25/16 2149 09/26/16 0257 09/26/16 0301  HGB 14.8  --   --   --  14.2  HCT 45.8  --   --   --  41.9  PLT 280  --   --   --  264  APTT 28  --   --   --   --   LABPROT 12.7  --   --   --   --   INR 0.95  --   --   --   --   HEPARINUNFRC  --   --   --  0.65  --   CREATININE 1.12*  --   --   --  0.74  TROPONINI <0.03 <0.03 <0.03  --   --     Estimated Creatinine Clearance: 60.9 mL/min (by C-G formula based on SCr of 0.74 mg/dL).   Assessment: 76 yo female here with chest pain starting on heparin drip.   Hgb 14.8, Plt 280, aPTT 28, INR 0.95  Goal of Therapy:  Heparin level 0.3-0.7 units/ml Monitor platelets by anticoagulation protocol: Yes   Plan:  Heparin 4000 units IV x1 then heparin drip at 800 units/hr (=8 ml/hr) Heparin level 8h after start of infusion - for 0300 in AM CBC in AM  6/30 0300 heparin level 0.65. Continue current regimen. Recheck in 8 hours to confirm.  Pharmacy will continue to follow.    Tayli Buch S 09/26/2016,3:46 AM

## 2016-09-26 NOTE — Progress Notes (Signed)
ANTICOAGULATION CONSULT NOTE - FOLLOW UP Consult  Pharmacy Consult for Heparin drip Indication: chest pain/ACS  Allergies  Allergen Reactions  . Amoxicillin Shortness Of Breath  . Doxycycline Nausea Only and Other (See Comments)    Dizzy Reaction:  Dizziness   . Sertraline Hcl Nausea And Vomiting    REACTION: trembling    Patient Measurements: Height: 5\' 7"  (170.2 cm) Weight: 145 lb 8 oz (66 kg) IBW/kg (Calculated) : 61.6 Heparin Dosing Weight: 70.8 kg  Vital Signs: Temp: 98.5 F (36.9 C) (06/30 1202) Temp Source: Oral (06/30 0437) BP: 142/61 (06/30 1202) Pulse Rate: 76 (06/30 1202)  Labs:  Recent Labs  09/25/16 1437 09/25/16 1824 09/25/16 2149 09/26/16 0257 09/26/16 0301 09/26/16 1059  HGB 14.8  --   --   --  14.2  --   HCT 45.8  --   --   --  41.9  --   PLT 280  --   --   --  264  --   APTT 28  --   --   --   --   --   LABPROT 12.7  --   --   --   --   --   INR 0.95  --   --   --   --   --   HEPARINUNFRC  --   --   --  0.65  --  0.54  CREATININE 1.12*  --   --   --  0.74  --   TROPONINI <0.03 <0.03 <0.03  --   --   --     Estimated Creatinine Clearance: 60.9 mL/min (by C-G formula based on SCr of 0.74 mg/dL).   Assessment: 76 yo female here with chest pain starting on heparin drip.   Hgb 14.8, Plt 280, aPTT 28, INR 0.95  Goal of Therapy:  Heparin level 0.3-0.7 units/ml Monitor platelets by anticoagulation protocol: Yes   Plan:  Heparin level within goal range. Will recheck Heparin level with am labs.   Araeya Lamb D 09/26/2016,12:06 PM

## 2016-09-26 NOTE — Progress Notes (Signed)
[place] at [place] NAME: Mckenzie Mclaughlin    MR#:  [id number]  DATE OF BIRTH:  1941-02-09  SUBJECTIVE: Admitted for chest pain, bradycardia. Chest pain resolved, bradycardia also resolved. This morning she has no chest pain but complains of hematuria and also mild shortness of breath.   CHIEF COMPLAINT:   Chief Complaint  Patient presents with  . Chest Pain    REVIEW OF SYSTEMS:   ROS CONSTITUTIONAL: No fever, fatigue or weakness.  EYES: No blurred or double vision.  EARS, NOSE, AND THROAT: No tinnitus or ear pain.  RESPIRATORY: No cough, shortness of breath, wheezing or hemoptysis.  CARDIOVASCULAR: No chest pain, orthopnea, edema.  GASTROINTESTINAL: No nausea, vomiting, diarrhea or abdominal pain.  GENITOURINARY: No dysuria.Patient has hematuria. ENDOCRINE: No polyuria, nocturia,  HEMATOLOGY: No anemia, easy bruising or bleeding SKIN: No rash or lesion. MUSCULOSKELETAL: No joint pain or arthritis.   NEUROLOGIC: No tingling, numbness, weakness.  PSYCHIATRY: No anxiety or depression.   DRUG ALLERGIES:   Allergies  Allergen Reactions  . Amoxicillin Shortness Of Breath  . Doxycycline Nausea Only and Other (See Comments)    Dizzy Reaction:  Dizziness   . Sertraline Hcl Nausea And Vomiting    REACTION: trembling    VITALS:  Blood pressure (!) 142/61, pulse 76, temperature 98.5 F (36.9 C), resp. rate 16, height 5\' 7"  (1.702 m), weight 66 kg (145 lb 8 oz), SpO2 100 %.  PHYSICAL EXAMINATION:  GENERAL:  76 y.o.-year-old patient lying in the bed with no acute distress.  EYES: Pupils equal, round, reactive to light and accommodation. No scleral icterus. Extraocular muscles intact.  HEENT: Head atraumatic, normocephalic. Oropharynx and nasopharynx clear.  NECK:  Supple, no jugular venous distention. No thyroid enlargement, no tenderness.  LUNGS: Normal breath sounds bilaterally, has faint expiratory wheezes bilaterally and  gurgling in her throat. rales,rhonchi or crepitation. No use of accessory muscles of respiration.  CARDIOVASCULAR: S1, S2 normal. No murmurs, rubs, or gallops.  ABDOMEN: Soft, nontender, nondistended. Bowel sounds present. No organomegaly or mass.  EXTREMITIES: No pedal edema, cyanosis, or clubbing.  NEUROLOGIC: Cranial nerves II through XII are intact. Muscle strength 5/5 in all extremities. Sensation intact. Gait not checked.  PSYCHIATRIC: The patient is alert and oriented x 3.  SKIN: No obvious rash, lesion, or ulcer.    LABORATORY PANEL:   CBC  Recent Labs Lab 09/26/16 0301  WBC 7.4  HGB 14.2  HCT 41.9  PLT 264   ------------------------------------------------------------------------------------------------------------------  Chemistries   Recent Labs Lab 09/25/16 1437 09/26/16 0301  NA 137 136  K 3.5 4.2  CL 98* 101  CO2 29 28  GLUCOSE 98 148*  BUN 18 16  CREATININE 1.12* 0.74  CALCIUM 10.2 9.4  MG 2.0  --   AST 32  --   ALT 22  --   ALKPHOS 85  --   BILITOT 0.6  --    ------------------------------------------------------------------------------------------------------------------  Cardiac Enzymes  Recent Labs Lab 09/25/16 2149  TROPONINI <0.03   ------------------------------------------------------------------------------------------------------------------  RADIOLOGY:  Dg Chest 2 View  Result Date: 09/25/2016 CLINICAL DATA:  Chest heaviness and shortness of Breath EXAM: CHEST  2 VIEW COMPARISON:  06/15/2016 FINDINGS: Cardiac shadow is stable. Left breast implant is noted. Aortic calcifications are again seen. Diffuse interstitial changes are seen without focal infiltrate or sizable effusion. No acute bony abnormality is noted. IMPRESSION: Chronic changes without acute abnormality. Electronically Signed   By: Inez Catalina M.D.   On:  09/25/2016 15:14   Ct Head Wo Contrast  Result Date: 09/25/2016 CLINICAL DATA:  Chest heaviness beginning yesterday.   Dizziness. EXAM: CT HEAD WITHOUT CONTRAST TECHNIQUE: Contiguous axial images were obtained from the base of the skull through the vertex without intravenous contrast. COMPARISON:  05/14/2016 FINDINGS: Brain: Age related atrophy. Mild to moderate chronic small-vessel ischemic change of the deep white matter. No sign of acute infarction, mass lesion, hemorrhage, hydrocephalus or extra-axial collection. Vascular: There is atherosclerotic calcification of the major vessels at the base of the brain. Skull: Negative Sinuses/Orbits: Clear/normal. Other: None significant IMPRESSION: Atrophy and chronic small-vessel ischemic changes. No acute finding by CT. No finding to explain dizziness. Electronically Signed   By: Nelson Chimes M.D.   On: 09/25/2016 14:55    EKG:   Orders placed or performed during the hospital encounter of 09/25/16  . ED EKG within 10 minutes  . ED EKG within 10 minutes  . EKG 12-Lead  . EKG 12-Lead  . ED EKG  . ED EKG    ASSESSMENT AND PLAN:   : Left-sided chest pain sounds atypical, troponins are negative for 2 times. Cardiology consult pending. Because of hematuria stopped the heparin drip. Patient  Says that she has hematuria and gets a workup with Dr. Hollice Espy . Supposed to get abdominal CAT scan for hematuria workup. But the patient did not show up for appointment. Patient had bradycardia at home but no heart rate is in 70s. A stopped e metoprolol, cardiology consult is pending. #2 .emphysema, incidental pulmonary nodule, dyspnea on exertion. Patient follows up with Dr. Raul Del.  Has  chronic respiratory failure and on oxygen 2 L all the time.  Now has  COPD exacerbation, continue IV Solu-Medrol, nebulizers.   3 .history of COPD, severe #4. depression; continue home medicines  #5 GERD #6 constipation #7 anxiety Possible discharge tomorrow.   All the records are reviewed and case discussed with Care Management/Social Workerr. Management plans discussed with the  patient, family and they are in agreement.  CODE STATUS: full  TOTAL TIME TAKING CARE OF THIS PATIENT:35 minutes.   POSSIBLE D/C IN 1-2 DAYS, DEPENDING ON CLINICAL CONDITION.   Epifanio Lesches M.D on 09/26/2016 at 12:24 PM  Between 7am to 6pm - Pager - [phone]  After 6pm go to [email or web address] - password [email or web address] [facility]  Office  [phone]  CC: Primary care physician; Tonia Ghent, MD   Note: This dictation was prepared with Dragon dictation along with smaller phrase technology. Any transcriptional errors that result from this process are unintentional.

## 2016-09-27 LAB — CBC
HCT: 38.5 % (ref 35.0–47.0)
Hemoglobin: 13.3 g/dL (ref 12.0–16.0)
MCH: 30.1 pg (ref 26.0–34.0)
MCHC: 34.5 g/dL (ref 32.0–36.0)
MCV: 87.4 fL (ref 80.0–100.0)
PLATELETS: 248 10*3/uL (ref 150–440)
RBC: 4.4 MIL/uL (ref 3.80–5.20)
RDW: 12.9 % (ref 11.5–14.5)
WBC: 10.8 10*3/uL (ref 3.6–11.0)

## 2016-09-27 LAB — HEPARIN LEVEL (UNFRACTIONATED)

## 2016-09-27 NOTE — Progress Notes (Signed)
Subjective:  Patient states no further chest pain still has dyspnea especially with exertion no leg swelling. Patient's feels much better than she did yesterday  Objective:  Vital Signs in the last 24 hours: Temp:  [98 F (36.7 C)-98.6 F (37 C)] 98.6 F (37 C) (07/01 1114) Pulse Rate:  [80-96] 94 (07/01 1114) Resp:  [18-20] 18 (07/01 1114) BP: (124-136)/(59-70) 131/61 (07/01 1114) SpO2:  [96 %-100 %] 98 % (07/01 1114)  Intake/Output from previous day: 06/30 0701 - 07/01 0700 In: 600 [P.O.:600] Out: 400 [Urine:400] Intake/Output from this shift: Total I/O In: 240 [P.O.:240] Out: 300 [Urine:300]  Physical Exam: General appearance: appears older than stated age Neck: no adenopathy, no carotid bruit, no JVD, supple, symmetrical, trachea midline and thyroid not enlarged, symmetric, no tenderness/mass/nodules Lungs: diminished breath sounds bilaterally Heart: regular rate and rhythm, S1, S2 normal, no murmur, click, rub or gallop Abdomen: soft, non-tender; bowel sounds normal; no masses,  no organomegaly Extremities: extremities normal, atraumatic, no cyanosis or edema Pulses: 2+ and symmetric Skin: Skin color, texture, turgor normal. No rashes or lesions Neurologic: Alert and oriented X 3, normal strength and tone. Normal symmetric reflexes. Normal coordination and gait  Lab Results:  Recent Labs  09/26/16 0301 09/27/16 0450  WBC 7.4 10.8  HGB 14.2 13.3  PLT 264 248    Recent Labs  09/25/16 1437 09/26/16 0301  NA 137 136  K 3.5 4.2  CL 98* 101  CO2 29 28  GLUCOSE 98 148*  BUN 18 16  CREATININE 1.12* 0.74    Recent Labs  09/25/16 1824 09/25/16 2149  TROPONINI <0.03 <0.03   Hepatic Function Panel  Recent Labs  09/25/16 1437  PROT 7.9  ALBUMIN 4.0  AST 32  ALT 22  ALKPHOS 85  BILITOT 0.6   No results for input(s): CHOL in the last 72 hours. No results for input(s): PROTIME in the last 72 hours.  Imaging: Imaging results have been  reviewed  Cardiac Studies:  Assessment/Plan:  Angina  Coronary artery disease History of PCI and stent COPD Hypoxemia History of aortic coarctation with stent placement GERD . Plan Agree with telemetry Continue short-term anticoagulation Recommended plans angina medications Imdur Add Ranexa Defer cardiac catheter for medical therapy Supplemental oxygen for COPD/SOB Continue inhalers for COPD symptoms We'll review the case with Dr. Saralyn Pilar  LOS: 2 days    Rihana Kiddy D Madiline Saffran 09/27/2016, 1:44 PM

## 2016-09-27 NOTE — Progress Notes (Signed)
ANTICOAGULATION CONSULT NOTE - FOLLOW UP Consult  Pharmacy Consult for Heparin drip Indication: chest pain/ACS  Allergies  Allergen Reactions  . Amoxicillin Shortness Of Breath  . Doxycycline Nausea Only and Other (See Comments)    Dizzy Reaction:  Dizziness   . Sertraline Hcl Nausea And Vomiting    REACTION: trembling    Patient Measurements: Height: 5\' 7"  (170.2 cm) Weight: 145 lb 8 oz (66 kg) IBW/kg (Calculated) : 61.6 Heparin Dosing Weight: 70.8 kg  Vital Signs: Temp: 98.1 F (36.7 C) (07/01 0332) Temp Source: Oral (07/01 0332) BP: 131/59 (07/01 0332) Pulse Rate: 96 (07/01 0332)  Labs:  Recent Labs  09/25/16 1437 09/25/16 1824 09/25/16 2149 09/26/16 0257 09/26/16 0301 09/26/16 1059 09/27/16 0450  HGB 14.8  --   --   --  14.2  --  13.3  HCT 45.8  --   --   --  41.9  --  38.5  PLT 280  --   --   --  264  --  248  APTT 28  --   --   --   --   --   --   LABPROT 12.7  --   --   --   --   --   --   INR 0.95  --   --   --   --   --   --   HEPARINUNFRC  --   --   --  0.65  --  0.54 <0.10*  CREATININE 1.12*  --   --   --  0.74  --   --   TROPONINI <0.03 <0.03 <0.03  --   --   --   --     Estimated Creatinine Clearance: 60.9 mL/min (by C-G formula based on SCr of 0.74 mg/dL).   Assessment: 76 yo female here with chest pain starting on heparin drip.   Hgb 14.8, Plt 280, aPTT 28, INR 0.95  Goal of Therapy:  Heparin level 0.3-0.7 units/ml Monitor platelets by anticoagulation protocol: Yes   Plan:  Heparin level within goal range. Will recheck Heparin level with am labs.   7/1 AM heparin level <0.1. Drip d/c by MD. RN will ask MD to d/c consult art rounding.  Ezinne Yogi S 09/27/2016,6:09 AM

## 2016-09-27 NOTE — Progress Notes (Signed)
[facility] NAME: Mckenzie Mclaughlin    MR#:  [id number]  DATE OF BIRTH:  [birth year]  SUBJECTIVE: Admitted for chest pain, bradycardia. Chest pain resolved, bradycardia also resolved. Patient denies any shortness of breath or chest pain. Heart rate is in 90s.   CHIEF COMPLAINT:   Chief Complaint  Patient presents with  . Chest Pain    REVIEW OF SYSTEMS:   ROS CONSTITUTIONAL: No fever, fatigue or weakness.  EYES: No blurred or double vision.  EARS, NOSE, AND THROAT: No tinnitus or ear pain.  RESPIRATORY: No cough, shortness of breath, wheezing or hemoptysis.  CARDIOVASCULAR: No chest pain, orthopnea, edema.  GASTROINTESTINAL: No nausea, vomiting, diarrhea or abdominal pain.  GENITOURINARY: No dysuria.Patient has hematuria. ENDOCRINE: No polyuria, nocturia,  HEMATOLOGY: No anemia, easy bruising or bleeding SKIN: No rash or lesion. MUSCULOSKELETAL: No joint pain or arthritis.   NEUROLOGIC: No tingling, numbness, weakness.  PSYCHIATRY: No anxiety or depression.   DRUG ALLERGIES:   Allergies  Allergen Reactions  . Amoxicillin Shortness Of Breath  . Doxycycline Nausea Only and Other (See Comments)    Dizzy Reaction:  Dizziness   . Sertraline Hcl Nausea And Vomiting    REACTION: trembling    VITALS:  Blood pressure 131/61, pulse 94, temperature 98.6 F (37 C), temperature source Oral, resp. rate 18, height 5\' 7"  (1.702 m), weight 66 kg (145 lb 8 oz), SpO2 98 %.  PHYSICAL EXAMINATION:  GENERAL:  76 y.o.-year-old patient lying in the bed with no acute distress.  EYES: Pupils equal, round, reactive to light . No scleral icterus. Extraocular muscles intact.  HEENT: Head atraumatic, normocephalic. Oropharynx and nasopharynx clear.  NECK:  Supple, no jugular venous distention. No thyroid enlargement, no tenderness.  LUNGS: Normal breath sounds bilaterally, CARDIOVASCULAR: S1, S2 normal. No murmurs, rubs, or gallops.   ABDOMEN: Soft, nontender, nondistended. Bowel sounds present. No organomegaly or mass.  EXTREMITIES: No pedal edema, cyanosis, or clubbing.  NEUROLOGIC: Cranial nerves II through XII are intact. Muscle strength 5/5 in all extremities. Sensation intact. Gait not checked.  PSYCHIATRIC: The patient is alert and oriented x 3.  SKIN: No obvious rash, lesion, or ulcer.    LABORATORY PANEL:   CBC  Recent Labs Lab 09/27/16 0450  WBC 10.8  HGB 13.3  HCT 38.5  PLT 248   ------------------------------------------------------------------------------------------------------------------  Chemistries   Recent Labs Lab 09/25/16 1437 09/26/16 0301  NA 137 136  K 3.5 4.2  CL 98* 101  CO2 29 28  GLUCOSE 98 148*  BUN 18 16  CREATININE 1.12* 0.74  CALCIUM 10.2 9.4  MG 2.0  --   AST 32  --   ALT 22  --   ALKPHOS 85  --   BILITOT 0.6  --    ------------------------------------------------------------------------------------------------------------------  Cardiac Enzymes  Recent Labs Lab 09/25/16 2149  TROPONINI <0.03   ------------------------------------------------------------------------------------------------------------------  RADIOLOGY:  Dg Chest 2 View  Result Date: 09/25/2016 CLINICAL DATA:  Chest heaviness and shortness of Breath EXAM: CHEST  2 VIEW COMPARISON:  06/15/2016 FINDINGS: Cardiac shadow is stable. Left breast implant is noted. Aortic calcifications are again seen. Diffuse interstitial changes are seen without focal infiltrate or sizable effusion. No acute bony abnormality is noted. IMPRESSION: Chronic changes without acute abnormality. Electronically Signed   By: Inez Catalina M.D.   On: 09/25/2016 15:14   Ct Head Wo Contrast  Result Date: 09/25/2016 CLINICAL DATA:  Chest heaviness beginning yesterday.  Dizziness. EXAM: CT  HEAD WITHOUT CONTRAST TECHNIQUE: Contiguous axial images were obtained from the base of the skull through the vertex without intravenous  contrast. COMPARISON:  05/14/2016 FINDINGS: Brain: Age related atrophy. Mild to moderate chronic small-vessel ischemic change of the deep white matter. No sign of acute infarction, mass lesion, hemorrhage, hydrocephalus or extra-axial collection. Vascular: There is atherosclerotic calcification of the major vessels at the base of the brain. Skull: Negative Sinuses/Orbits: Clear/normal. Other: None significant IMPRESSION: Atrophy and chronic small-vessel ischemic changes. No acute finding by CT. No finding to explain dizziness. Electronically Signed   By: Nelson Chimes M.D.   On: 09/25/2016 14:55    EKG:   Orders placed or performed during the hospital encounter of 09/25/16  . ED EKG within 10 minutes  . ED EKG within 10 minutes  . EKG 12-Lead  . EKG 12-Lead  . ED EKG  . ED EKG    ASSESSMENT AND PLAN:   : Left-sided chest pain sounds atypical, troponins are negative for 2 times. Cardiology consult pending.Patient has no further chest pain today. Treating her for acute COPD exacerbation. Patient wants to see Dr. Quentin Mulling.   Because of hematuria stopped the heparin drip. Patient  Says that she has hematuria and gets a workup with Dr. Hollice Espy . Supposed to get abdominal CAT scan for hematuria workup. But the patient did not show up for appointment  . Patient had bradycardia witth HR 40  at home but  Now  heart rate is in 90. Continue to hold beta blockers because of episode  Of symptomatic bradycardia ,appreciated cardiology following the patient for possible discharge tomorrow.  #2 .emphysema, incidental pulmonary nodule, dyspnea on exertion. Patient follows up with Dr. Raul Del.  Has  chronic respiratory failure and on oxygen 2 L all the time.  Now has  COPD exacerbation, continue IV Solu-Medrol, nebulizers.   3 .history of COPD, severe  #4. depression; continue home medicines  #5 GERD #6 constipation #7 anxiety    All the records are reviewed and case discussed with Care  Management/Social Workerr. Management plans discussed with the patient, family and they are in agreement.  CODE STATUS: full  TOTAL TIME TAKING CARE OF THIS PATIENT:35 minutes.   POSSIBLE D/C IN 1-2 DAYS, DEPENDING ON CLINICAL CONDITION.   Epifanio Lesches M.D on 09/27/2016 at 1:13 PM  Between 7am to 6pm - Pager - [phone]  After 6pm go to [email or web address] - password [email or web address] [facility]  Office  [phone]  CC: Primary care physician; Tonia Ghent, MD   Note: This dictation was prepared with Dragon dictation along with smaller phrase technology. Any transcriptional errors that result from this process are unintentional.

## 2016-09-28 ENCOUNTER — Inpatient Hospital Stay: Payer: Medicare Other

## 2016-09-28 DIAGNOSIS — J441 Chronic obstructive pulmonary disease with (acute) exacerbation: Principal | ICD-10-CM

## 2016-09-28 DIAGNOSIS — J9809 Other diseases of bronchus, not elsewhere classified: Secondary | ICD-10-CM

## 2016-09-28 LAB — EXPECTORATED SPUTUM ASSESSMENT W REFEX TO RESP CULTURE: SPECIAL REQUESTS: NORMAL

## 2016-09-28 LAB — EXPECTORATED SPUTUM ASSESSMENT W GRAM STAIN, RFLX TO RESP C

## 2016-09-28 MED ORDER — METHYLPREDNISOLONE SODIUM SUCC 125 MG IJ SOLR
60.0000 mg | Freq: Four times a day (QID) | INTRAMUSCULAR | Status: DC
  Administered 2016-09-28: 60 mg via INTRAVENOUS
  Filled 2016-09-28: qty 2

## 2016-09-28 MED ORDER — METHYLPREDNISOLONE SODIUM SUCC 40 MG IJ SOLR
40.0000 mg | Freq: Two times a day (BID) | INTRAMUSCULAR | Status: DC
  Administered 2016-09-29 – 2016-10-01 (×5): 40 mg via INTRAVENOUS
  Filled 2016-09-28 (×5): qty 1

## 2016-09-28 MED ORDER — LEVOFLOXACIN 500 MG PO TABS
500.0000 mg | ORAL_TABLET | Freq: Every day | ORAL | Status: AC
  Administered 2016-09-28 – 2016-10-02 (×5): 500 mg via ORAL
  Filled 2016-09-28 (×5): qty 1

## 2016-09-28 MED ORDER — DOCUSATE SODIUM 100 MG PO CAPS
100.0000 mg | ORAL_CAPSULE | Freq: Two times a day (BID) | ORAL | Status: DC
  Administered 2016-09-28 – 2016-10-02 (×9): 100 mg via ORAL
  Filled 2016-09-28 (×9): qty 1

## 2016-09-28 MED ORDER — DILTIAZEM HCL 30 MG PO TABS
60.0000 mg | ORAL_TABLET | Freq: Three times a day (TID) | ORAL | Status: DC
Start: 2016-09-28 — End: 2016-10-05
  Administered 2016-09-28 – 2016-10-05 (×22): 60 mg via ORAL
  Filled 2016-09-28 (×22): qty 2

## 2016-09-28 MED ORDER — LACTULOSE 10 GM/15ML PO SOLN
20.0000 g | Freq: Every day | ORAL | Status: DC | PRN
  Administered 2016-09-28 – 2016-09-29 (×2): 20 g
  Filled 2016-09-28 (×3): qty 30

## 2016-09-28 NOTE — Plan of Care (Signed)
Problem: Activity: Goal: Ability to tolerate increased activity will improve Outcome: Not Progressing Heart rate increasing to 140's with ambulation.

## 2016-09-28 NOTE — Consult Note (Signed)
PULMONARY CONSULT NOTE  Requesting MD/Service: Mckenzie Mclaughlin Date of initial consultation: 09/28/16 Reason for consultation: COPD with exacerbation   HPI:  76 y.o. female former smoker, patient of Dr Raul Del with oxygen dependent COPD admitted 06/29 with bradycardia, chest pressure and light headedness. She ruled out for MI and has been seen by Cardiology. She was also noted to be wheezing on admission exam and has been treated with systemic steroids, nebulized steroids and bronchodilators and oxygen. These symptoms have persisted prompting Pulmonary Med consultation. In addition, she has now developed rattling cough with scant discolored mucus. She denies pleuritic CP, fever, hemoptysis, LE edema and calf tenderness.   Past Medical History:  Diagnosis Date  . Anginal pain [facility])    see dr [name]  "Spams"  . Anxiety   . Arthritis   . Cancer (HCC)    side of head- skin cancer, squamous cell ca on left foot.  . Complication of anesthesia   . Constipation   . COPD (chronic obstructive pulmonary disease) ([place])   . COPD, severe 07/29/98  . Coronary artery disease 01/22/99   Non Q-wave MI, stent RCA  . Depression   . Depression   . Dysrhythmia    Palpations at times. last time 10/29 ish 2014  . Esophageal dilatation    Pt needs appple sauce to take meds.  . Esophageal stricture    PT needs apple sauce to take meds  . GERD (gastroesophageal reflux disease) 08/29/98   severe-no meds now  . Head injury, acute, with loss of consciousness ([place]) 11/2012   unsure how long  . History of blood transfusion   . History of blurry vision 05/06-05/10/2009   Hospital [facility] CP R/O'D Blurry vision, smoking  . History of CT scan of head 08/02/09   w/o mild age appropr atrophy  . History of ETT 08/03/09   Myoview Normal  . History of ETT 05/1999   Cardiolite pos ETT, neg Cardiolite  . History of ETT 09/02/01   Normal  . History of ETT 04/28/2006   Myoview normal EF 83%  . History of ETT 04/10/11    normal EF and no ischemia per Dr. Saralyn Pilar  . Hx of cardiac catheterization 08/29/01   60% RCA 0/w 20-30% lesions  . Hx of cardiac catheterization 01/22/99   w/Stent 90% RCA lesion  . Hypertension    03/30/00  . Mental disorder   . On home oxygen therapy    as needed-has a travel tank  . Pneumothorax 2/14  . PONV (postoperative nausea and vomiting)    ad breast remocved and see was under anesthesia a long time  . Shortness of breath   . Status post aortic coarctation stent placement     Past Surgical History:  Procedure Laterality Date  . ABDOMINAL HYSTERECTOMY    . BACK SURGERY    . BREAST SURGERY     implant removal, R breast  . CAROTID STENT Right 2000  . CATARACT EXTRACTION Bilateral    2016  . COLONOSCOPY    . ESOPHAGEAL DILATION  06/00   EGD  . LAMINECTOMY  1976   Disc Removal X 2 Lumbar  . MASTECTOMY  03/1976   Bilateral due FCBD with implants [facility])  . OOPHORECTOMY    . TISSUE EXPANDER PLACEMENT Right 02/03/2013   Procedure: REMOVAL OF RIGHT BREAST IMPLANT AND IMPLANT MATERIAL  CAPSULECTOMY;  Surgeon: Irene Limbo, MD;  Location: [facility];  Service: Plastics;  Laterality: Right;    MEDICATIONS: I have reviewed  all medications and confirmed regimen as documented  Social History   Social History  . Marital status: Married    Spouse name: N/A  . Number of children: 0  . Years of education: N/A   Occupational History  . 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 Smoking status: Former Smoker    Packs/day: 0.40    Years: 50.00    Types: Cigarettes    Quit date: 02/28/2012  . Smokeless tobacco: Never Used     Comment: 1/4 PPD as of 2012  . Alcohol use No  . Drug use: No  . Sexual activity: No   Other Topics Concern  . Not on file   Social History Narrative   Retired Disability 2004 Lung disease   Married 1962   Prev rode her Tony with husband    Family History   Problem Relation Age of Onset  . Stroke Mother   . Heart disease Mother        MI  . Heart failure Mother   . Cancer Father        Lung  . COPD Brother   . Cancer Brother        Lung with mets 2011  . Heart disease Brother        CAD  . Heart disease Sister        cirrhosis due heart disease  . Cirrhosis Sister   . Colon cancer Neg Hx   . Breast cancer Neg Hx     ROS: No fever, myalgias/arthralgias, unexplained weight loss or weight gain No new focal weakness or sensory deficits No otalgia, hearing loss, visual changes, nasal and sinus symptoms, mouth and throat problems No neck pain or adenopathy No abdominal pain, N/V/D, diarrhea, change in bowel pattern No dysuria, change in urinary pattern   Vitals:   09/28/16 0401 09/28/16 0752 09/28/16 1141 09/28/16 1340  BP: 119/61  140/74   Pulse: (!) 103  (!) 109   Resp: 18  18   Temp: 97.8 F (36.6 C)  97.8 F (36.6 C)   TempSrc: Oral  Oral   SpO2: 95% 95% 98% 100%  Weight:      Height:         EXAM:  Gen: WDWN, No overt respiratory distress HEENT: NCAT, sclera white, oropharynx normal Neck: Supple without LAN, thyromegaly, JVD Lungs: breath sounds full, percussion normal, diffuse rhonchi which clear partially with cough. No wheezes Cardiovascular: RRR, no murmurs noted Abdomen: Soft, nontender, normal BS Ext: without clubbing, cyanosis, edema Neuro: CNs grossly intact, motor and sensory intact Skin: Limited exam, no lesions noted  DATA:   BMP Latest Ref Rng & Units 09/26/2016 09/25/2016 05/15/2016  Glucose 65 - 99 mg/dL 148(H) 98 85  BUN 6 - 20 mg/dL 16 18 15   Creatinine 0.44 - 1.00 mg/dL 0.74 1.12(H) 0.82  Sodium 135 - 145 mmol/L 136 137 143  Potassium 3.5 - 5.1 mmol/L 4.2 3.5 3.8  Chloride 101 - 111 mmol/L 101 98(L) 106  CO2 22 - 32 mmol/L 28 29 31   Calcium 8.9 - 10.3 mg/dL 9.4 10.2 8.6(L)    CBC Latest Ref Rng & Units 09/27/2016 09/26/2016 09/25/2016  WBC 3.6 - 11.0 K/uL 10.8 7.4 13.0(H)  Hemoglobin 12.0 -  16.0 g/dL 13.3 14.2 14.8  Hematocrit 35.0 - 47.0 % 38.5 41.9 45.8  Platelets 150 - 440 K/uL 248 264 280    CXR:  Chronic changes.  No edema or acute infiltrates   IMPRESSION:   Former smoker COPD - appears to be moderate to severe at baseline Acute COPD exacerbation manifesting mostly as mucus retention/purulent bronchitis  PLAN:  Continue systemic steroids - dosage adjusted  Taper to off over next 5-7 days Continue supplemental O2 Continue nebulized steroids and bronchodilators Add levofloxacin 500 mg daily - complete 5-7 day course Flutter valve to facilitate mucus clearance  I have notified Dr Raul Del who will follow up during this hospitalization  Merton Border, MD PCCM service Mobile [phone] Pager [phone] 09/28/2016 4:48 PM

## 2016-09-28 NOTE — Progress Notes (Signed)
[facility] [place] NAME: Mckenzie Mclaughlin    MR#:  [id number]  DATE OF BIRTH:  1941/02/02  SUBJECTIVE; having no shortness of breath with nebulization, heart rate up to  110 bpm, going up to 120s to 1 30 bpm with activity. Also having cough and green phlegm. Has constipation.   CHIEF COMPLAINT:   Chief Complaint  Patient presents with  . Chest Pain    REVIEW OF SYSTEMS:   ROS CONSTITUTIONAL: No fever, fatigue or weakness.  EYES: No blurred or double vision.  EARS, NOSE, AND THROAT: No tinnitus or ear pain.  RESPIRATORY: Cough, shortness of breath, wheezing, green phlegm.  CARDIOVASCULAR: No chest pain, orthopnea, edema.  GASTROINTESTINAL: No nausea, vomiting, diarrhea or abdominal pain.  GENITOURINARY: No dysuria.Patient has hematuria. ENDOCRINE: No polyuria, nocturia,  HEMATOLOGY: No anemia, easy bruising or bleeding SKIN: No rash or lesion. MUSCULOSKELETAL: No joint pain or arthritis.   NEUROLOGIC: No tingling, numbness, weakness.  PSYCHIATRY: No anxiety or depression.   DRUG ALLERGIES:   Allergies  Allergen Reactions  . Amoxicillin Shortness Of Breath  . Doxycycline Nausea Only and Other (See Comments)    Dizzy Reaction:  Dizziness   . Sertraline Hcl Nausea And Vomiting    REACTION: trembling    VITALS:  Blood pressure 140/74, pulse (!) 109, temperature 97.8 F (36.6 C), temperature source Oral, resp. rate 18, height 5\' 7"  (1.702 m), weight 66 kg (145 lb 8 oz), SpO2 100 %.  PHYSICAL EXAMINATION:  GENERAL:  76 y.o.-year-old patient lying in the bed with no acute distress.  EYES: Pupils equal, round, reactive to light . No scleral icterus. Extraocular muscles intact.  HEENT: Head atraumatic, normocephalic. Oropharynx and nasopharynx clear.  NECK:  Supple, no jugular venous distention. No thyroid enlargement, no tenderness.  LUNGS;  Poor air entry bilaterally. CARDIOVASCULAR: S1, S2 normal. No murmurs, rubs, or  gallops.  ABDOMEN: Soft, nontender, nondistended. Bowel sounds present. No organomegaly or mass.  EXTREMITIES: No pedal edema, cyanosis, or clubbing.  NEUROLOGIC: Cranial nerves II through XII are intact. Muscle strength 5/5 in all extremities. Sensation intact. Gait not checked.  PSYCHIATRIC: The patient is alert and oriented x 3.  SKIN: No obvious rash, lesion, or ulcer.    LABORATORY PANEL:   CBC  Recent Labs Lab 09/27/16 0450  WBC 10.8  HGB 13.3  HCT 38.5  PLT 248   ------------------------------------------------------------------------------------------------------------------  Chemistries   Recent Labs Lab 09/25/16 1437 09/26/16 0301  NA 137 136  K 3.5 4.2  CL 98* 101  CO2 29 28  GLUCOSE 98 148*  BUN 18 16  CREATININE 1.12* 0.74  CALCIUM 10.2 9.4  MG 2.0  --   AST 32  --   ALT 22  --   ALKPHOS 85  --   BILITOT 0.6  --    ------------------------------------------------------------------------------------------------------------------  Cardiac Enzymes  Recent Labs Lab 09/25/16 2149  TROPONINI <0.03   ------------------------------------------------------------------------------------------------------------------  RADIOLOGY:  No results found.  EKG:   Orders placed or performed during the hospital encounter of 09/25/16  . ED EKG within 10 minutes  . ED EKG within 10 minutes  . EKG 12-Lead  . EKG 12-Lead  . ED EKG  . ED EKG    ASSESSMENT AND PLAN:   : Left-sided chest pain Due to acute bronchitis. Troponins are negative for 3 times. Seen by cardiology. Added Ranexa for chronic angina. No further cardiac workup is planned.    Because of hematuria stopped  the heparin drip. Patient  Says that she has hematuria and gets a workup with Dr. Hollice Espy . Supposed to get abdominal CAT scan for hematuria workup. But the patient did not show up for appointment  . Patient had bradycardia witth HR 40  at home but now has tachycardia, heart rate  up to 120-1 30 bpm with ambulation, went up to 170 beats AT  One point. Started on Cardizem, monitor on telemetry, has respiratory distress, increasing the steroids, to repeat chest x-ray. hAs tachycardia likely due to respiratory distress also.  #2 .emphysema, incidental pulmonary nodule, dyspnea on exertion. Patient follows up with Dr. Raul Del.  Has  chronic respiratory failure and on oxygen 2 L all the time.  Now has  COPD exacerbation, continue IV Solu-Medrol, nebulizers. Check sputum cultures, patient is requesting to see Dr. Raul Del.  3 .history of COPD, severe  #4. depression; continue home medicines  #5 GERD #6 constipation;add lactulose,colace #7 anxiety    All the records are reviewed and case discussed with Care Management/Social Workerr. Management plans discussed with the patient, family and they are in agreement.  CODE STATUS: full  TOTAL TIME TAKING CARE OF THIS PATIENT:35 minutes.   POSSIBLE D/C IN 1-2 DAYS, DEPENDING ON CLINICAL CONDITION.   Epifanio Lesches M.D on 09/28/2016 at 1:53 PM  Between 7am to 6pm - Pager - [phone]  After 6pm go to [email or web address] - password [email or web address] [facility]  Office  [phone]  CC: Primary care physician; Tonia Ghent, MD   Note: This dictation was prepared with Dragon dictation along with smaller phrase technology. Any transcriptional errors that result from this process are unintentional.

## 2016-09-29 MED ORDER — FUROSEMIDE 10 MG/ML IJ SOLN
40.0000 mg | Freq: Once | INTRAMUSCULAR | Status: AC
  Administered 2016-09-29: 40 mg via INTRAVENOUS
  Filled 2016-09-29: qty 4

## 2016-09-29 NOTE — Progress Notes (Signed)
[facility] [place] NAME: Anabelle Bungert    MR#:  [id number]  DATE OF BIRTH:  Sep 08, 1940  SUBJECTIVE;  Seen at bedside, shortness of breath and cough. chest x-rays did not show any pneumonia.   CHIEF COMPLAINT:   Chief Complaint  Patient presents with  . Chest Pain    REVIEW OF SYSTEMS:   ROS CONSTITUTIONAL: No fever, fatigue or weakness.  EYES: No blurred or double vision.  EARS, NOSE, AND THROAT: No tinnitus or ear pain.  RESPIRATORY: Cough, shortness of breath, wheezing, green phlegm.  CARDIOVASCULAR: No chest pain, orthopnea, edema.  GASTROINTESTINAL: No nausea, vomiting, diarrhea or abdominal pain.  GENITOURINARY: No dysuria.Patient has hematuria. ENDOCRINE: No polyuria, nocturia,  HEMATOLOGY: No anemia, easy bruising or bleeding SKIN: No rash or lesion. MUSCULOSKELETAL: No joint pain or arthritis.   NEUROLOGIC: No tingling, numbness, weakness.  PSYCHIATRY: No anxiety or depression.   DRUG ALLERGIES:   Allergies  Allergen Reactions  . Amoxicillin Shortness Of Breath  . Doxycycline Nausea Only and Other (See Comments)    Dizzy Reaction:  Dizziness   . Sertraline Hcl Nausea And Vomiting    REACTION: trembling    VITALS:  Blood pressure 132/62, pulse (!) 102, temperature 97.9 F (36.6 C), temperature source Oral, resp. rate 18, height 5\' 7"  (1.702 m), weight 66 kg (145 lb 8 oz), SpO2 99 %.  PHYSICAL EXAMINATION:  GENERAL:  76 y.o.-year-old patient lying in the bed with no acute distress.  EYES: Pupils equal, round, reactive to light . No scleral icterus. Extraocular muscles intact.  HEENT: Head atraumatic, normocephalic. Oropharynx and nasopharynx clear.  NECK:  Supple, no jugular venous distention. No thyroid enlargement, no tenderness.  LUNGS;  Poor air entry bilaterally.No wheezing. CARDIOVASCULAR: S1, S2 normal. No murmurs, rubs, or gallops.  ABDOMEN: Soft, nontender, nondistended. Bowel sounds present. No  organomegaly or mass.  EXTREMITIES: No pedal edema, cyanosis, or clubbing.  NEUROLOGIC: Cranial nerves II through XII are intact. Muscle strength 5/5 in all extremities. Sensation intact. Gait not checked.  PSYCHIATRIC: The patient is alert and oriented x 3.  SKIN: No obvious rash, lesion, or ulcer.    LABORATORY PANEL:   CBC  Recent Labs Lab 09/27/16 0450  WBC 10.8  HGB 13.3  HCT 38.5  PLT 248   ------------------------------------------------------------------------------------------------------------------  Chemistries   Recent Labs Lab 09/25/16 1437 09/26/16 0301  NA 137 136  K 3.5 4.2  CL 98* 101  CO2 29 28  GLUCOSE 98 148*  BUN 18 16  CREATININE 1.12* 0.74  CALCIUM 10.2 9.4  MG 2.0  --   AST 32  --   ALT 22  --   ALKPHOS 85  --   BILITOT 0.6  --    ------------------------------------------------------------------------------------------------------------------  Cardiac Enzymes  Recent Labs Lab 09/25/16 2149  TROPONINI <0.03   ------------------------------------------------------------------------------------------------------------------  RADIOLOGY:  Dg Chest 1 View  Result Date: 09/28/2016 CLINICAL DATA:  Chest pain and bradycardia EXAM: CHEST 1 VIEW COMPARISON:  September 25, 2016. FINDINGS: There is scarring in the right base. There is no edema or consolidation. Heart size and pulmonary vascularity are normal. No adenopathy. There is a breast implant on the left. There is aortic atherosclerosis. No bone lesions. No pneumothorax. IMPRESSION: Scarring right base. No edema or consolidation. Stable cardiac silhouette. There is aortic atherosclerosis. Aortic Atherosclerosis (ICD10-I70.0). Electronically Signed   By: Lowella Grip III M.D.   On: 09/28/2016 15:02    EKG:   Orders placed  or performed during the hospital encounter of 09/25/16  . ED EKG within 10 minutes  . ED EKG within 10 minutes  . EKG 12-Lead  . EKG 12-Lead  . ED EKG  . ED EKG     ASSESSMENT AND PLAN:   : Left-sided chest pain Due to acute bronchitis. Troponins are negative for 3 times. Seen by cardiology. Added Ranexa for chronic angina. No further cardiac workup is planned.    Because of hematuria stopped the heparin drip.  . Patient had bradycardia witth HR 40  at home but now has tachycardia, heart rate up to 120-1 30 bpm with ambulation, went up to 170 beats AT  One point.  continue Cardizem, heart rate is better   #2 .emphysema, incidental pulmonary nodule, dyspnea on exertion. Patient follows up with Dr. Raul Del.  Seen By pulmonary. Dr. Rosita Fire. Has  chronic respiratory failure and on oxygen 2 L all the time.  Now has  COPD exacerbation, continue IV Solu-Medrol, nebulizers. Follow sputum culture data, increase oxygen to 3 L on ambulation and patient does this at home also. Physical therapy, home health aide and RN services. Discussed this with patient.   3 .history of COPD, severe  #4. depression; continue home medicines  #5 GERD #6 constipation;add lactulose,colace #7 anxiety    All the records are reviewed and case discussed with Care Management/Social Workerr. Management plans discussed with the patient, family and they are in agreement.  CODE STATUS: full  TOTAL TIME TAKING CARE OF THIS PATIENT:35 minutes.   POSSIBLE D/C IN 1-2 DAYS, DEPENDING ON CLINICAL CONDITION.   Epifanio Lesches M.D on 09/29/2016 at 11:05 AM  Between 7am to 6pm - Pager - [phone]  After 6pm go to [email or web address] - password [email or web address]  Office  [phone]  CC: Primary care physician; Tonia Ghent, MD   Note: This dictation was prepared with Dragon dictation along with smaller phrase technology. Any transcriptional errors that result from this process are unintentional.

## 2016-09-30 MED ORDER — FUROSEMIDE 40 MG PO TABS
40.0000 mg | ORAL_TABLET | Freq: Every day | ORAL | Status: DC
  Administered 2016-10-01 – 2016-10-05 (×5): 40 mg via ORAL
  Filled 2016-09-30 (×5): qty 1

## 2016-09-30 MED ORDER — ACETYLCYSTEINE 20 % IN SOLN
600.0000 mg | Freq: Two times a day (BID) | RESPIRATORY_TRACT | Status: DC
  Administered 2016-09-30: 600 mg via ORAL
  Filled 2016-09-30 (×4): qty 4

## 2016-09-30 MED ORDER — SODIUM CHLORIDE 0.9% FLUSH
3.0000 mL | Freq: Two times a day (BID) | INTRAVENOUS | Status: DC
  Administered 2016-09-30 – 2016-10-05 (×10): 3 mL via INTRAVENOUS

## 2016-09-30 MED ORDER — BISACODYL 10 MG RE SUPP
10.0000 mg | Freq: Once | RECTAL | Status: AC
  Administered 2016-09-30: 10 mg via RECTAL
  Filled 2016-09-30: qty 1

## 2016-09-30 NOTE — Progress Notes (Signed)
   09/30/16 1500  Clinical Encounter Type  Visited With Other (Comment);Health care provider  Visit Type Initial  Referral From [facility] was rounding on unit. [facility] knocked pt was busy with staff. [facility] will try again later.

## 2016-09-30 NOTE — Plan of Care (Signed)
Problem: Bowel/Gastric: Goal: Will not experience complications related to bowel motility Outcome: Not Progressing Patient having difficulty having bowel movement despite receiving Lactulose & prune juice.

## 2016-09-30 NOTE — Progress Notes (Signed)
[facility] [place] NAME: Mckenzie Mclaughlin    MR#:  [id number]  DATE OF BIRTH:  04-Jun-1940  SUBJECTIVE;  having shortness of breath, unable to get the phlegm out, tachycardia with minimal ambulation, today her main complaint is constipation.   CHIEF COMPLAINT:   Chief Complaint  Patient presents with  . Chest Pain    REVIEW OF SYSTEMS:   ROS CONSTITUTIONAL: No fever, fatigue or weakness.  EYES: No blurred or double vision.  EARS, NOSE, AND THROAT: No tinnitus or ear pain.  RESPIRATORY: Cough, shortness of breath, wheezing, green phlegm.  CARDIOVASCULAR: No chest pain, orthopnea, edema.  GASTROINTESTINAL: No nausea, vomiting, diarrhea or abdominal pain.  C/o of constipation. GENITOURINARY: No dysuria.Patient has hematuria. ENDOCRINE: No polyuria, nocturia,  HEMATOLOGY: No anemia, easy bruising or bleeding SKIN: No rash or lesion. MUSCULOSKELETAL: No joint pain or arthritis.   NEUROLOGIC: No tingling, numbness, weakness.  PSYCHIATRY: No anxiety or depression.   DRUG ALLERGIES:   Allergies  Allergen Reactions  . Amoxicillin Shortness Of Breath  . Doxycycline Nausea Only and Other (See Comments)    Dizzy Reaction:  Dizziness   . Sertraline Hcl Nausea And Vomiting    REACTION: trembling    VITALS:  Blood pressure (!) 145/59, pulse (!) 112, temperature 98.4 F (36.9 C), temperature source Oral, resp. rate 15, height 5\' 7"  (1.702 m), weight 66 kg (145 lb 8 oz), SpO2 100 %.  PHYSICAL EXAMINATION:  GENERAL:  76 y.o.-year-old patient lying in the bed with no acute distress.  EYES: Pupils equal, round, reactive to light . No scleral icterus. Extraocular muscles intact.  HEENT: Head atraumatic, normocephalic. Oropharynx and nasopharynx clear.  NECK:  Supple, no jugular venous distention. No thyroid enlargement, no tenderness.  LUNGS;  Poor air entry bilaterally.No wheezing. CARDIOVASCULAR: S1, S2 normal. No murmurs, rubs, or  gallops.  ABDOMEN: Soft, nontender, nondistended. Bowel sounds present. No organomegaly or mass.  EXTREMITIES: No pedal edema, cyanosis, or clubbing.  NEUROLOGIC: Cranial nerves II through XII are intact. Muscle strength 5/5 in all extremities. Sensation intact. Gait not checked.  PSYCHIATRIC: The patient is alert and oriented x 3.  SKIN: No obvious rash, lesion, or ulcer.    LABORATORY PANEL:   CBC  Recent Labs Lab 09/27/16 0450  WBC 10.8  HGB 13.3  HCT 38.5  PLT 248   ------------------------------------------------------------------------------------------------------------------  Chemistries   Recent Labs Lab 09/25/16 1437 09/26/16 0301  NA 137 136  K 3.5 4.2  CL 98* 101  CO2 29 28  GLUCOSE 98 148*  BUN 18 16  CREATININE 1.12* 0.74  CALCIUM 10.2 9.4  MG 2.0  --   AST 32  --   ALT 22  --   ALKPHOS 85  --   BILITOT 0.6  --    ------------------------------------------------------------------------------------------------------------------  Cardiac Enzymes  Recent Labs Lab 09/25/16 2149  TROPONINI <0.03   ------------------------------------------------------------------------------------------------------------------  RADIOLOGY:  Dg Chest 1 View  Result Date: 09/28/2016 CLINICAL DATA:  Chest pain and bradycardia EXAM: CHEST 1 VIEW COMPARISON:  September 25, 2016. FINDINGS: There is scarring in the right base. There is no edema or consolidation. Heart size and pulmonary vascularity are normal. No adenopathy. There is a breast implant on the left. There is aortic atherosclerosis. No bone lesions. No pneumothorax. IMPRESSION: Scarring right base. No edema or consolidation. Stable cardiac silhouette. There is aortic atherosclerosis. Aortic Atherosclerosis (ICD10-I70.0). Electronically Signed   By: Lowella Grip III M.D.   On: 09/28/2016  15:02    EKG:   Orders placed or performed during the hospital encounter of 09/25/16  . ED EKG within 10 minutes  . ED EKG  within 10 minutes  . EKG 12-Lead  . EKG 12-Lead  . ED EKG  . ED EKG    ASSESSMENT AND PLAN:   : Left-sided chest pain Due to acute bronchitis. Troponins are negative for 3 times. Seen by cardiology. Added Ranexa for chronic angina. No further cardiac workup is planned.    Because of hematuria stopped the heparin drip.  . Patient had bradycardia witth HR 40  at home but now has tachycardia, heart rate up to 120-1 30 bpm with ambulation, went up to 170 beats AT  One point.  continue Cardizem, heart rate is better   #2 .emphysema, incidental pulmonary nodule, dyspnea on exertion. Patient follows up with Dr. Raul Del.  Seen By pulmonary. Dr. Rosita Fire. Has  chronic respiratory failure and on oxygen 2 L all the time.  Now has  COPD exacerbation, continue IV Solu-Medrol, nebulizers. Follow sputum culture data, increase oxygen to 3 L on ambulation and patient does this at home also. Physical therapy, home health aide and RN services. Discussed this with patient.   3 .history of COPD, severe; aCOPD exacerbation. Add Mucinex nebulizers for mucolytic effect. Lungs respiratory failure, on 2 L of oxygen, advised the patient and the nurse to use more oxygen when she ambulates up to 3 L.  #4. depression; continue home medicines  #5 GERD  #6 constipation;add lactulose,colace  #7 anxiety;on XANAX  constipation: Continue stool softeners, lactulose.   All the records are reviewed and case discussed with Care Management/Social Workerr. Management plans discussed with the patient, family and they are in agreement.  CODE STATUS: full  TOTAL TIME TAKING CARE OF THIS PATIENT:35 minutes.   POSSIBLE D/C IN 1-2 DAYS, DEPENDING ON CLINICAL CONDITION.   Epifanio Lesches M.D on 09/30/2016 at 11:52 AM  Between 7am to 6pm - Pager - [phone]  After 6pm go to [email or web address] - password [email or web address] [facility]  Office  [phone]  CC: Primary care physician; Tonia Ghent,  MD   Note: This dictation was prepared with Dragon dictation along with smaller phrase technology. Any transcriptional errors that result from this process are unintentional.

## 2016-10-01 ENCOUNTER — Inpatient Hospital Stay: Payer: Medicare Other

## 2016-10-01 LAB — CULTURE, RESPIRATORY W GRAM STAIN: Special Requests: NORMAL

## 2016-10-01 LAB — CULTURE, RESPIRATORY

## 2016-10-01 MED ORDER — RANOLAZINE ER 500 MG PO TB12
500.0000 mg | ORAL_TABLET | Freq: Two times a day (BID) | ORAL | Status: DC
  Administered 2016-10-01 – 2016-10-05 (×9): 500 mg via ORAL
  Filled 2016-10-01 (×10): qty 1

## 2016-10-01 MED ORDER — METHYLPREDNISOLONE SODIUM SUCC 40 MG IJ SOLR
40.0000 mg | INTRAMUSCULAR | Status: DC
  Administered 2016-10-02 – 2016-10-05 (×4): 40 mg via INTRAVENOUS
  Filled 2016-10-01 (×4): qty 1

## 2016-10-01 MED ORDER — LACTULOSE 10 GM/15ML PO SOLN
20.0000 g | Freq: Two times a day (BID) | ORAL | Status: DC
  Administered 2016-10-01 – 2016-10-05 (×7): 20 g
  Filled 2016-10-01 (×8): qty 30

## 2016-10-01 NOTE — Progress Notes (Signed)
End of shift note: Patient had one episode of HR in the 140's with exertion. No bowel movement this shift. ABD xray shows moderate stool, no obstruction. Lactulose to be given per MD orders. Q hr PO ativan given for anxiety. Patient up in chair for lunch and dinner meals. Ambulated with PT. No adverse reaction to medications or treatment during the shift.

## 2016-10-01 NOTE — Care Management (Signed)
Physical therapy consult is recommending skilled nursing placement.   Contacted PCP regarding Ranexa and spoke with primary nurse regarding cardiology follow up. CSW is aware.

## 2016-10-01 NOTE — Plan of Care (Signed)
Problem: Safety: Goal: Ability to remain free from injury will improve Outcome: Progressing Patient will remain free from falls and injury during the shift.  Problem: Activity: Goal: Risk for activity intolerance will decrease Outcome: Progressing Patient will sit in chair for meals, ambulate to bathroom, and to hallway.  Problem: Cardiac: Goal: Ability to achieve and maintain adequate cardiovascular perfusion will improve Outcome: Progressing Patient remain in SR and have decrease episodes of elevated HR of 140's with exertion.

## 2016-10-01 NOTE — Progress Notes (Signed)
[facility] Mclaughlin at [place] NAME: Mckenzie Mclaughlin    MR#:  [id number]  DATE OF BIRTH:  11/15/1940  SUBJECTIVE;  she still says she has shortness of breath, unable to tolerate U, S nebulizer because of palpitations. Has cough unable to get the phlegm out , still constipated despite enemas and intense bowel egiments yesterday. She says she is not passing gas either. Appears comfortable, no abdominal pain.   CHIEF COMPLAINT:   Chief Complaint  Patient presents with  . Chest Pain    REVIEW OF SYSTEMS:   ROS CONSTITUTIONAL: No fever, fatigue or weakness.  EYES: No blurred or double vision.  EARS, NOSE, AND THROAT: No tinnitus or ear pain.  RESPIRATORY: Cough, shortness of breath, wheezing, green phlegm.  CARDIOVASCULAR: No chest pain, orthopnea, edema.  GASTROINTESTINAL: No nausea, vomiting, diarrhea or abdominal pain.  C/o of constipation. GENITOURINARY: No dysuria.Patient has hematuria. ENDOCRINE: No polyuria, nocturia,  HEMATOLOGY: No anemia, easy bruising or bleeding SKIN: No rash or lesion. MUSCULOSKELETAL: No joint pain or arthritis.   NEUROLOGIC: No tingling, numbness, weakness.  PSYCHIATRY: No anxiety or depression.   DRUG ALLERGIES:   Allergies  Allergen Reactions  . Amoxicillin Shortness Of Breath  . Doxycycline Nausea Only and Other (See Comments)    Dizzy Reaction:  Dizziness   . Sertraline Hcl Nausea And Vomiting    REACTION: trembling    VITALS:  Blood pressure (!) 141/49, pulse 99, temperature 97.7 F (36.5 C), temperature source Oral, resp. rate 16, height 5\' 7"  (1.702 m), weight 66 kg (145 lb 8 oz), SpO2 99 %.  PHYSICAL EXAMINATION:  GENERAL:  76 y.o.-year-old patient lying in the bed with no acute distress.  EYES: Pupils equal, round, reactive to light . No scleral icterus. Extraocular muscles intact.  HEENT: Head atraumatic, normocephalic. Oropharynx and nasopharynx clear.  NECK:  Supple, no jugular venous  distention. No thyroid enlargement, no tenderness.  LUNGS;  Poor air entry bilaterally.No wheezing. CARDIOVASCULAR: S1, S2 normal. No murmurs, rubs, or gallops.  ABDOMEN: Soft, nontender, nondistended. Bowel sounds present. No organomegaly or mass.  EXTREMITIES: No pedal edema, cyanosis, or clubbing.  NEUROLOGIC: Cranial nerves II through XII are intact. Muscle strength 5/5 in all extremities. Sensation intact. Gait not checked.  PSYCHIATRIC: The patient is alert and oriented x 3.  SKIN: No obvious rash, lesion, or ulcer.    LABORATORY PANEL:   CBC  Recent Labs Lab 09/27/16 0450  WBC 10.8  HGB 13.3  HCT 38.5  PLT 248   ------------------------------------------------------------------------------------------------------------------  Chemistries   Recent Labs Lab 09/25/16 1437 09/26/16 0301  NA 137 136  K 3.5 4.2  CL 98* 101  CO2 29 28  GLUCOSE 98 148*  BUN 18 16  CREATININE 1.12* 0.74  CALCIUM 10.2 9.4  MG 2.0  --   AST 32  --   ALT 22  --   ALKPHOS 85  --   BILITOT 0.6  --    ------------------------------------------------------------------------------------------------------------------  Cardiac Enzymes  Recent Labs Lab 09/25/16 2149  TROPONINI <0.03   ------------------------------------------------------------------------------------------------------------------  RADIOLOGY:  No results found.  EKG:   Orders placed or performed during the hospital encounter of 09/25/16  . ED EKG within 10 minutes  . ED EKG within 10 minutes  . EKG 12-Lead  . EKG 12-Lead  . ED EKG  . ED EKG    ASSESSMENT AND PLAN:   : Left-sided chest pain Due to acute bronchitis. Troponins are negative for 3 times.  Seen by cardiology. Added Ranexa for chronic angina. No further cardiac workup is planned.    Because of hematuria stopped the heparin drip.  . Patient had bradycardia witth HR 40  at home but now has tachycardia, heart rate up to 120-1 30 bpm with  ambulation, went up to 170 beats AT  One point.  continue Cardizem, heart rate is better,   #2 .emphysema, incidental pulmonary nodule, dyspnea on exertion. Patient follows up with Dr. Raul Del.  Seen By pulmonary. Dr. Rosita Fire. Has  chronic respiratory failure and on oxygen 2 L all the time.  Now has  COPD exacerbation, Decrease the dose of Solu-Medrol, continue  nebulizers.  increase oxygen to 3 L on ambulation and patient does this at home also. Physical therapy, home health aide and RN services. Discussed this with patient.   3 .history of COPD, severe; aCOPD exacerbation.  Unable to  tolerate Mucomyst nebs.add po mucinex.continue flutter valve. #5 GERD  #6 constipation;add lactulose,colace, abdominal x-ray today,if  negative continue stool softeners # 7 anxiety;on [facility]  Discharge tomorrow.   All the records are reviewed and case discussed with Care Management/Social Workerr. Management plans discussed with the patient, family and they are in agreement.  CODE STATUS: full  TOTAL TIME TAKING CARE OF THIS PATIENT:35 minutes.   POSSIBLE D/C IN 1-2 DAYS, DEPENDING ON CLINICAL CONDITION.   Epifanio Lesches M.D on 10/01/2016 at 12:21 PM  Between 7am to 6pm - Pager - [phone]  After 6pm go to [email or web address] - password [email or web address] [facility]  Office  [phone]  CC: Primary care physician; Tonia Ghent, MD   Note: This dictation was prepared with Dragon dictation along with smaller phrase technology. Any transcriptional errors that result from this process are unintentional.

## 2016-10-01 NOTE — Evaluation (Signed)
Physical Therapy Evaluation Patient Details Name: Mckenzie Mclaughlin MRN: [id number] DOB: January 02, 1941 Today's Date: 10/01/2016   History of Present Illness  76 y/o female admitted for chest pain on 09/25/16. Pt found to have symptomatic bradycardia (HR in 40's) and was diagnosed with unstable angina. Negative for any acute cardiac disease. Pt begun have hematuria on 09/26/16, which pt has had on/off prior to hospital admission (abdominal CT scan pending). Negative for pneumonia, so pt being treated for COPD exacerbation. PMH includes COPD (O2 dep), CAD, skin cancer, anxiety, depression, dysrhythmia, GERD, head injury with LOC, ETT, HTN, cardiac cath, pneumothorax, and aortic stunt.     Clinical Impression  Pt is a pleasant 76 year old admitted for chest pain, bradycardia (now tachycardia), and COPD exacerbation. Prior to admission, pt was entirely independent in all ADL's, she took care of her husband, and ambulated without an AD both within and outside her home. Pt now requires physical assist for basic bed mobility, transfers and ambulation (min guard to min assist). Ambulated 72ft with RW on 3L of O2. Pt's HR rose to 136bpm after ambulating this distance, and her SAO2 remained above 90% on 3L O2 when ambulating. Pt able to ambulate 59ft a 2nd time after a 45min standing rest break. After this, pt states she was very fatigued and unable to perform more there-ex or ambulate. During supine there-ex, pt required multiple rest breaks due to fatigue (HR remained between 120-130bpm during there-ex). Pt demonstrates deficits in activity tolerance, endurance, and strength. Would benefit from skilled PT to address above deficits and promote optimal return to PLOF. Pt is motivated to participate in therapy. PT recommends dc to SNF given pt's current mobility/function well-below her baseline.     Follow Up Recommendations SNF    Equipment Recommendations  None recommended by PT    Recommendations for Other  Services       Precautions / Restrictions Precautions Precautions: Fall Restrictions Weight Bearing Restrictions: No      Mobility  Bed Mobility Overal bed mobility: Needs Assistance Bed Mobility: Supine to Sit     Supine to sit: Min guard;Min assist     General bed mobility comments: Min guard-min assist for supine to EOB for scooting. Verbal cueing for hand placement and sequencing. No dizziness noted once EOB. Pt able to sit with feet supported and BUE support. HR remained below 125bpm with bed mobility. SAO2 remained above 93% on 2L O2.   Transfers Overall transfer level: Needs assistance Equipment used: Rolling walker (2 wheeled) Transfers: Sit to/from Stand Sit to Stand: Min guard;Min assist         General transfer comment: Min guard-min assist for sit to stand to achieve upright posture with RW. Pt steady on feet once standing. HR rose to 126bpm when standing/transferring. SAO2 remained above 90% on 2L O2.   Ambulation/Gait Ambulation/Gait assistance: Min guard;Min assist Ambulation Distance (Feet): 15 Feet Assistive device: Rolling walker (2 wheeled) Gait Pattern/deviations: Step-to pattern;Trunk flexed;Wide base of support     General Gait Details: Ambulated 71ft with RW and min guard-min assist. Pt ambulated 2 laps in her room (totaling 50ft) with a 32min standing rest break between laps. Pt stated she was very fatigued after ambulating both laps. HR after 1st lap 136bpm and 134bpm after 2nd. SAO2 remained above 90% on 3L O2. Pt stated she could not ambulate further due to fatigue and SOB. Once seated, pt's HR settled to 116bpm within 1-4min. Pt's HR at rest was measured at 106bpm.  Stairs            Wheelchair Mobility    Modified Rankin (Stroke Patients Only)       Balance Overall balance assessment: No apparent balance deficits (not formally assessed) (no falls in last 3-49yrs)                                            Pertinent Vitals/Pain Pain Assessment: No/denies pain    Home Living Family/patient expects to be discharged to:: Private residence Living Arrangements: Spouse/significant other (pt takes care of husband (he is unable to help her)) Available Help at Discharge: Other (Comment) (no one - pt states all friends/family unable to help) Type of Home: House Home Access: Stairs to enter Entrance Stairs-Rails: Can reach both Entrance Stairs-Number of Steps: 5 Home Layout: One level Home Equipment: Walker - 2 wheels;Bedside commode Additional Comments: 5 steps divided into two sets (first has 2 steps, second has 3).    Prior Function Level of Independence: Independent         Comments: Ambulated within home without AD. Ambulated outside home some, but not often.     Hand Dominance        Extremity/Trunk Assessment   Upper Extremity Assessment Upper Extremity Assessment: Overall WFL for tasks assessed (4/5 B for elbow flex/ext and grip)    Lower Extremity Assessment Lower Extremity Assessment: Generalized weakness (grossly 4-/5 B for DF/PF and knee flex/ext)       Communication   Communication: No difficulties  Cognition Arousal/Alertness: Awake/alert Behavior During Therapy: WFL for tasks assessed/performed Overall Cognitive Status: Within Functional Limits for tasks assessed                                        General Comments      Exercises Other Exercises Other Exercises: Supine ther-ex x10 B included: ankle pumps, SLR's, and hip abd. Performed ankle pumps with supervision and SLR's/hip abd with min assist due to pt's weakness. Pt required multiple rest breaks both within and between sets due to fatigue and rising HR. HR remained between 120-130bpm with ther-ex. SAO2 remained above 90% on 2L O2.    Assessment/Plan    PT Assessment Patient needs continued PT services  PT Problem List Decreased strength;Decreased activity  tolerance;Cardiopulmonary status limiting activity       PT Treatment Interventions Gait training;Stair training;Therapeutic activities;Therapeutic exercise;Patient/family education    PT Goals (Current goals can be found in the Care Plan section)  Acute Rehab PT Goals Patient Stated Goal: to get stronger PT Goal Formulation: With patient Time For Goal Achievement: 10/15/16 Potential to Achieve Goals: Good    Frequency Min 2X/week   Barriers to discharge        Co-evaluation               AM-PAC PT "6 Clicks" Daily Activity  Outcome Measure Difficulty turning over in bed (including adjusting bedclothes, sheets and blankets)?: A Lot Difficulty moving from lying on back to sitting on the side of the bed? : Total Difficulty sitting down on and standing up from a chair with arms (e.g., wheelchair, bedside commode, etc,.)?: Total Help needed moving to and from a bed to chair (including a wheelchair)?: A Little Help needed walking in hospital room?: A Little Help needed  climbing 3-5 steps with a railing? : A Lot 6 Click Score: 12    End of Session Equipment Utilized During Treatment: Gait belt;Oxygen Activity Tolerance: Patient limited by fatigue (Limited by HR (peaked at 136bpm)) Patient left: in chair;with call bell/phone within reach;with chair alarm set Nurse Communication: Mobility status PT Visit Diagnosis: Muscle weakness (generalized) (M62.81);Other abnormalities of gait and mobility (R26.89)    Time: 5188-4166 PT Time Calculation (min) (ACUTE ONLY): 33 min   Charges:         PT G Codes:        Donaciano Eva, PT, SPT  Marni Griffon 10/01/2016, 12:33 PM

## 2016-10-02 MED ORDER — DOCUSATE SODIUM 100 MG PO CAPS
200.0000 mg | ORAL_CAPSULE | Freq: Two times a day (BID) | ORAL | Status: DC
  Administered 2016-10-02 – 2016-10-05 (×6): 200 mg via ORAL
  Filled 2016-10-02 (×6): qty 2

## 2016-10-02 MED ORDER — LEVOFLOXACIN 500 MG PO TABS
500.0000 mg | ORAL_TABLET | Freq: Every day | ORAL | Status: DC
  Administered 2016-10-03 – 2016-10-05 (×3): 500 mg via ORAL
  Filled 2016-10-02 (×3): qty 1

## 2016-10-02 NOTE — NC FL2 (Signed)
[place] LEVEL OF CARE SCREENING TOOL     IDENTIFICATION  Patient Name: Mckenzie Mclaughlin Birthdate: 1940-10-17 Sex: female Admission Date (Current Location): 09/25/2016  [place] and [place] Number:  Engineering geologist and Address:  [facility], [address] [place], [place] [address]      Provider Number: [id number]  Attending Physician Name and Address:  Epifanio Lesches, MD  Relative Name and Phone Number:  Woehrle,Richard Spouse [phone] or Gae Dry [phone]  [phone]     Current Level of Care: Hospital Recommended Level of Care: [place] Prior Approval Number:    Date Approved/Denied:   PASRR Number: [id number]  Discharge Plan: SNF    Current Diagnoses: Patient Active Problem List   Diagnosis Date Noted  . Unstable angina ([place]) 09/25/2016  . Dizziness 05/14/2016  . Atrophic vaginitis 02/28/2015  . Gross hematuria 02/26/2015  . Radicular pain in right arm 11/21/2014  . Pupil asymmetry 11/16/2014  . Osteoporosis 11/16/2014  . Senile purpura ([place]) 11/16/2014  . Right hand pain 11/16/2014  . Advance care planning 08/15/2014  . Other specified counseling 08/15/2014  . Constipation 06/21/2014  . NSTEMI (non-ST elevated myocardial infarction) ([place]) 12/17/2013  . Acute subendocardial infarction ([place]) 12/17/2013  . History of cardiac catheterization 09/15/2013  . Chest pain on exertion 08/25/2013  . Medicare annual wellness visit, subsequent 07/09/2013  . Beat, premature ventricular 06/26/2013  . Awareness of heartbeats 06/26/2013  . Arteriosclerosis of coronary artery 01/05/2013  . Pulmonary emphysema ([place]) 01/05/2013  . Acid reflux 01/05/2013  . H/O pneumothorax 01/05/2013  . Neck pain 08/31/2012  . Anxiety and depression 07/11/2012  . Dysthymia 07/11/2012  . Esophageal dilatation   . Esophageal stricture   . Nutritional marasmus ([place]) 06/22/2012  . COPD with acute  exacerbation ([place]) 06/17/2012  . Chronic obstructive pulmonary disease with acute exacerbation ([place]) 06/17/2012  . Lung nodule 04/17/2012  . Edema of foot 04/01/2012  . Paresthesia 10/27/2011  . Atypical chest pain 05/13/2011  . HLD (hyperlipidemia) 02/26/2011  . History of mammogram 02/26/2011  . Vertigo 02/18/2011  . B12 deficiency 01/31/2009  . HEADACHE 10/22/2006  . PREMATURE VENTRICULAR CONTRACTIONS, FREQUENT 07/19/2006  . HYPERTENSION 03/30/2000  . Essential (primary) hypertension 03/30/2000  . CORONARY ARTERY DISEASE 01/22/1999  . Atherosclerosis of coronary artery 01/22/1999  . GERD 08/29/1998  . Chronic obstructive pulmonary disease ([place]) 07/29/1998    Orientation RESPIRATION BLADDER Height & Weight     Self, Time, Situation, Place  O2 (2L) Continent Weight: 145 lb 8 oz (66 kg) Height:  5\' 7"  (170.2 cm)  BEHAVIORAL SYMPTOMS/MOOD NEUROLOGICAL BOWEL NUTRITION STATUS      Continent Diet (Cardiac Diet)  AMBULATORY STATUS COMMUNICATION OF NEEDS Skin   Limited Assist Verbally Normal                       Personal Care Assistance Level of Assistance  Bathing, Feeding, Dressing Bathing Assistance: Limited assistance Feeding assistance: Independent Dressing Assistance: Limited assistance     Functional Limitations Info  Sight, Hearing, Speech Sight Info: Adequate Hearing Info: Adequate Speech Info: Adequate    SPECIAL CARE FACTORS FREQUENCY  PT (By licensed PT)     PT Frequency: 5x a week               Contractures Contractures Info: Not present    Additional Factors Info  Code Status, Allergies, Psychotropic Code Status Info: Full Code Allergies Info: AMOXICILLIN, DOXYCYCLINE, SERTRALINE HCL Psychotropic Info:  venlafaxine [facility]) tablet 37.5 mg and venlafaxine [facility]) tablet 75 mg         Current Medications (10/02/2016):  This is the current hospital active medication list Current Facility-Administered Medications  Medication Dose Route  Frequency Provider Last Rate Last Dose  . acetaminophen (TYLENOL) tablet 650 mg  650 mg Oral Q6H PRN Loletha Grayer, MD   650 mg at 10/01/16 1941  . ALPRAZolam Duanne Moron) tablet 0.5 mg  0.5 mg Oral TID PRN Loletha Grayer, MD   0.5 mg at 10/02/16 0549  . aspirin EC tablet 81 mg  81 mg Oral Daily Loletha Grayer, MD   81 mg at 10/02/16 1029  . atorvastatin (LIPITOR) tablet 20 mg  20 mg Oral q1800 Loletha Grayer, MD   20 mg at 10/01/16 1654  . budesonide (PULMICORT) nebulizer solution 0.5 mg  0.5 mg Nebulization BID Loletha Grayer, MD   0.5 mg at 10/02/16 0753  . clopidogrel (PLAVIX) tablet 75 mg  75 mg Oral Daily Loletha Grayer, MD   75 mg at 10/02/16 1029  . diltiazem (CARDIZEM) tablet 60 mg  60 mg Oral Q8H Epifanio Lesches, MD   60 mg at 10/02/16 1539  . docusate sodium (COLACE) capsule 200 mg  200 mg Oral BID Epifanio Lesches, MD      . furosemide (LASIX) tablet 40 mg  40 mg Oral Daily Epifanio Lesches, MD   40 mg at 10/02/16 1029  . ipratropium-albuterol (DUONEB) 0.5-2.5 (3) MG/3ML nebulizer solution 3 mL  3 mL Nebulization Q6H Wieting, Richard, MD   3 mL at 10/02/16 1331  . lactulose (CHRONULAC) 10 GM/15ML solution 20 g  20 g Per Tube BID Epifanio Lesches, MD   20 g at 10/02/16 1028  . [START ON 10/03/2016] levofloxacin (LEVAQUIN) tablet 500 mg  500 mg Oral Daily Epifanio Lesches, MD      . meclizine (ANTIVERT) tablet 25 mg  25 mg Oral TID PRN Loletha Grayer, MD   25 mg at 09/25/16 2114  . methylPREDNISolone sodium succinate (SOLU-MEDROL) 40 mg/mL injection 40 mg  40 mg Intravenous Q24H Epifanio Lesches, MD   40 mg at 10/02/16 0112  . nitroGLYCERIN (NITROSTAT) SL tablet 0.4 mg  0.4 mg Sublingual Q5 min PRN Loletha Grayer, MD      . ranolazine (RANEXA) 12 hr tablet 500 mg  500 mg Oral BID Epifanio Lesches, MD   500 mg at 10/02/16 1029  . sodium chloride flush (NS) 0.9 % injection 3 mL  3 mL Intravenous Q12H Epifanio Lesches, MD   3 mL at 10/02/16 1030  .  venlafaxine (EFFEXOR) tablet 37.5 mg  37.5 mg Oral Q2200 Loletha Grayer, MD   37.5 mg at 10/01/16 2159  . venlafaxine (EFFEXOR) tablet 75 mg  75 mg Oral q morning - 10a Loletha Grayer, MD   75 mg at 10/02/16 1029     Discharge Medications: Please see discharge summary for a list of discharge medications.  Relevant Imaging Results:  Relevant Lab Results:   Additional Information SSN [id number]  Ross Ludwig, [place]

## 2016-10-02 NOTE — Care Management (Signed)
Spoke with patient and her husband and both in agreement with short term skilled nursing placement.  Patient has not had adequate bowel movement since admission.  She does have chronic constipation.

## 2016-10-02 NOTE — Clinical Social Work Placement (Addendum)
   CLINICAL SOCIAL WORK PLACEMENT  NOTE  Date:  10/02/2016  Patient Details  Name: Mckenzie Mclaughlin MRN: [id number] Date of Birth: 05/28/1940  Clinical Social Work is seeking post-discharge placement for this patient at the [place] level of care (*CSW will initial, date and re-position this form in  chart as items are completed):  Yes   Patient/family provided with [place] Work Department's list of facilities offering this level of care within the geographic area requested by the patient (or if unable, by the patient's family).  Yes   Patient/family informed of their freedom to choose among providers that offer the needed level of care, that participate in Medicare, Medicaid or managed care program needed by the patient, have an available bed and are willing to accept the patient.  Yes   Patient/family informed of [facility]'s ownership interest in [facility] and [facility], as well as of the fact that they are under no obligation to receive care at these facilities.  PASRR submitted to EDS on       PASRR number received on       Existing PASRR number confirmed on 10/02/16     FL2 transmitted to all facilities in geographic area requested by pt/family on 10/02/16     FL2 transmitted to all facilities within larger geographic area on       Patient informed that his/her managed care company has contracts with or will negotiate with certain facilities, including the following:         10-03-16   Patient/family informed of bed offers received. (updated Evette Cristal, MSW, [place], 10-05-16)  Patient chooses bed at  [facility] (updated Evette Cristal, MSW, [place], 10-05-16)    Physician recommends and patient chooses bed at      Patient to be transferred to  [facility] on 10-05-16 (updated Evette Cristal, MSW, LCSWA, 10-05-16).  Patient to be transferred to facility by   [facility] (updated Evette Cristal, MSW, [place], 10-05-16)     Patient family notified on  10-05-16 of transfer. (updated Evette Cristal, MSW, LCSWA, 10-05-16)  Name of family member notified:   Patient's husband was at bedside (updated Evette Cristal, MSW, [place], 10-05-16)     PHYSICIAN Please sign FL2     Additional Comment:    _______________________________________________ Ross Ludwig, LCSWA 10/02/2016, 6:00 PM  (updated Evette Cristal, MSW, LCSWA, 10-05-16)

## 2016-10-02 NOTE — Progress Notes (Signed)
Physical Therapy Treatment Patient Details Name: Mckenzie Mclaughlin MRN: [id number] DOB: 1940/07/30 Today's Date: 10/02/2016    History of Present Illness 76 y/o female admitted for chest pain on 09/25/16. Pt found to have symptomatic bradycardia (HR in 40's) and was diagnosed with unstable angina. Negative for any acute cardiac disease. Pt begun have hematuria on 09/26/16, which pt has had on/off prior to hospital admission (abdominal CT scan pending). Negative for pneumonia, so pt being treated for COPD exacerbation. PMH includes COPD (O2 dep), CAD, skin cancer, anxiety, depression, dysrhythmia, GERD, head injury with LOC, ETT, HTN, cardiac cath, pneumothorax, and aortic stunt.     PT Comments    Pt progressing well toward goals. Ambulated 60ft 4x with RW and min guard on 2L O2. Pt ambulated a total of 61ft (3 laps in her room and 1 trip to bathroom). SAO2 remained above 90% when ambulating. HR peaked at 141 after ambulating to/from bathroom, but pt did not complain of dizziness/lightheadedness/etc. Pt required standing or seated breaks after ambulating 61ft to maintain her SAO2 over 90% and HR below 141bpm. Pt tolerated today's treatment well and has demonstrated improved activity tolerance since last session. Will continue to progress.    Follow Up Recommendations  SNF     Equipment Recommendations  None recommended by PT    Recommendations for Other Services       Precautions / Restrictions Precautions Precautions: Fall Restrictions Weight Bearing Restrictions: No    Mobility  Bed Mobility Overal bed mobility: Needs Assistance Bed Mobility: Supine to Sit     Supine to sit: Min guard     General bed mobility comments: Supine to EOB with min guard for safety. Pt demonstrated good technique with BUE use on hand rails. Pt's HR after transfer <125bpm with no dizziness reported. Performed on 2L O2: SAO2 >95%.   Transfers Overall transfer level: Needs assistance Equipment used:  Rolling walker (2 wheeled) Transfers: Sit to/from Stand Sit to Stand: Supervision         General transfer comment: Supervision with verbal cueing for upright posture. Pt able to stand without UE support - pt remained steady on feet with no LOB noted.   Ambulation/Gait Ambulation/Gait assistance: Min guard Ambulation Distance (Feet): 20 Feet Assistive device: Rolling walker (2 wheeled) Gait Pattern/deviations: Step-through pattern;Trunk flexed;Wide base of support     General Gait Details: Ambulated 89ft with RW and min guard on 2L of O2. Pt ambulated 3 laps in her room with one seated break and one standing break after each lap. SAO2 remained above 94% while ambulating. Pt's HR remained below 140 while ambulating.   Stairs            Wheelchair Mobility    Modified Rankin (Stroke Patients Only)       Balance Overall balance assessment: No apparent balance deficits (not formally assessed)                                          Cognition Arousal/Alertness: Awake/alert Behavior During Therapy: WFL for tasks assessed/performed Overall Cognitive Status: Within Functional Limits for tasks assessed                                        Exercises Other Exercises Other Exercises: Supine ther-ex x10 B included: ankle pumps,  SLR's, and hip abd. Performed with supervision on 2L O2. SAO2 remained above 95% and HR remained below 120bpm. Other Exercises: Pt ambulated from EOB to/from bathroom (91ft) with RW and min guard. Supervision for toilet hygiene and clothing manipulation. SAO2 remained above 90% and HR peaked at 141bpm.    General Comments        Pertinent Vitals/Pain Pain Assessment: No/denies pain    Home Living                      Prior Function            PT Goals (current goals can now be found in the care plan section) Acute Rehab PT Goals Patient Stated Goal: to get stronger PT Goal Formulation: With  patient Time For Goal Achievement: 10/15/16 Potential to Achieve Goals: Good Progress towards PT goals: Progressing toward goals    Frequency    Min 2X/week      PT Plan Current plan remains appropriate    Co-evaluation              AM-PAC PT "6 Clicks" Daily Activity  Outcome Measure  Difficulty turning over in bed (including adjusting bedclothes, sheets and blankets)?: A Little Difficulty moving from lying on back to sitting on the side of the bed? : Total Difficulty sitting down on and standing up from a chair with arms (e.g., wheelchair, bedside commode, etc,.)?: A Little Help needed moving to and from a bed to chair (including a wheelchair)?: A Little Help needed walking in hospital room?: A Little Help needed climbing 3-5 steps with a railing? : A Lot 6 Click Score: 15    End of Session Equipment Utilized During Treatment: Gait belt;Oxygen Activity Tolerance: Patient tolerated treatment well (Limited by fatigue (mildly) and HR (peaked at 141bpm)) Patient left: in bed;with bed alarm set Nurse Communication: Mobility status PT Visit Diagnosis: Muscle weakness (generalized) (M62.81);Other abnormalities of gait and mobility (R26.89)     Time: 3159-4585 PT Time Calculation (min) (ACUTE ONLY): 29 min  Charges:                       G Codes:       Mckenzie Mclaughlin, PT, SPT  Mckenzie Mclaughlin 10/02/2016, 5:01 PM

## 2016-10-02 NOTE — Progress Notes (Signed)
[place] [facility] [place] NAME: Mckenzie Mclaughlin    MR#:  [id number]  DATE OF BIRTH:  1940/09/01  SUBJECTIVE; still has constipation.sputum cultures showed pseudomonas.wants to go to SNF.  CHIEF COMPLAINT:   Chief Complaint  Patient presents with  . Chest Pain    REVIEW OF SYSTEMS:   ROS CONSTITUTIONAL: No fever, fatigue or weakness.  EYES: No blurred or double vision.  EARS, NOSE, AND THROAT: No tinnitus or ear pain.  RESPIRATORY: Cough, shortness of breath, wheezing, green phlegm.  CARDIOVASCULAR: No chest pain, orthopnea, edema.  GASTROINTESTINAL: No nausea, vomiting, diarrhea or abdominal pain.  C/o of constipation. GENITOURINARY: No dysuria.Patient has hematuria. ENDOCRINE: No polyuria, nocturia,  HEMATOLOGY: No anemia, easy bruising or bleeding SKIN: No rash or lesion. MUSCULOSKELETAL: No joint pain or arthritis.   NEUROLOGIC: No tingling, numbness, weakness.  PSYCHIATRY: No anxiety or depression.   DRUG ALLERGIES:   Allergies  Allergen Reactions  . Amoxicillin Shortness Of Breath  . Doxycycline Nausea Only and Other (See Comments)    Dizzy Reaction:  Dizziness   . Sertraline Hcl Nausea And Vomiting    REACTION: trembling    VITALS:  Blood pressure (!) 140/54, pulse 89, temperature 98 F (36.7 C), temperature source Oral, resp. rate 16, height 5\' 7"  (1.702 m), weight 66 kg (145 lb 8 oz), SpO2 100 %.  PHYSICAL EXAMINATION:  GENERAL:  76 y.o.-year-old patient lying in the bed with no acute distress.  EYES: Pupils equal, round, reactive to light . No scleral icterus. Extraocular muscles intact.  HEENT: Head atraumatic, normocephalic. Oropharynx and nasopharynx clear.  NECK:  Supple, no jugular venous distention. No thyroid enlargement, no tenderness.  LUNGS;  Poor air entry bilaterally.No wheezing. CARDIOVASCULAR: S1, S2 normal. No murmurs, rubs, or gallops.  ABDOMEN: Soft, nontender, nondistended. Bowel sounds  present. No organomegaly or mass.  EXTREMITIES: No pedal edema, cyanosis, or clubbing.  NEUROLOGIC: Cranial nerves II through XII are intact. Muscle strength 5/5 in all extremities. Sensation intact. Gait not checked.  PSYCHIATRIC: The patient is alert and oriented x 3.  SKIN: No obvious rash, lesion, or ulcer.    LABORATORY PANEL:   CBC  Recent Labs Lab 09/27/16 0450  WBC 10.8  HGB 13.3  HCT 38.5  PLT 248   ------------------------------------------------------------------------------------------------------------------  Chemistries   Recent Labs Lab 09/26/16 0301  NA 136  K 4.2  CL 101  CO2 28  GLUCOSE 148*  BUN 16  CREATININE 0.74  CALCIUM 9.4   ------------------------------------------------------------------------------------------------------------------  Cardiac Enzymes  Recent Labs Lab 09/25/16 2149  TROPONINI <0.03   ------------------------------------------------------------------------------------------------------------------  RADIOLOGY:  Dg Abd 1 View  Result Date: 10/01/2016 CLINICAL DATA:  76 year old female with a history of constipation EXAM: ABDOMEN - 1 VIEW COMPARISON:  CT 10/03/2015 FINDINGS: Gas within stomach, small bowel, colon. No abnormally distended small bowel or colon. Gas extends to the rectum. Formed stool within the right colon and proximal transverse colon. No displaced fracture.  Degenerative changes of the spine. Rounded radiopaque density in the left abdomen measures approximately 14- 15 mm, compatible with known left collecting system nephrolithiasis. IMPRESSION: Moderate stool burden without evidence of obstruction. Known left-sided nephrolithiasis Electronically Signed   By: Corrie Mckusick D.O.   On: 10/01/2016 13:32    EKG:   Orders placed or performed during the hospital encounter of 09/25/16  . ED EKG within 10 minutes  . ED EKG within 10 minutes  . EKG 12-Lead  . EKG 12-Lead  .  ED EKG  . ED EKG    ASSESSMENT AND  PLAN:   : Left-sided chest pain Due to acute bronchitis. Troponins are negative for 3 times. Seen by cardiology. Added Ranexa for chronic angina. No further cardiac workup is planned.    Because of hematuria stopped the heparin drip.  . Patient had bradycardia witth HR 40  at home but now has tachycardia, heart rate up to 120-1 30 bpm with ambulation, went up to 170 beats AT  One point.  continue Cardizem, heart rate is better   #2 .emphysema, incidental pulmonary nodule, dyspnea on exertion. Patient follows up with Dr. Raul Del.  Seen By pulmonary. Dr. Rosita Fire. Has  chronic respiratory failure and on oxygen 2 L all the time.  Now has  COPD exacerbation, continue IV Solu-Medrol, nebulizers. Sputum showed pseudomonas.continue levaquin Discharge;pt wants to go to SNF,sw consulted,t.   3 .history of COPD, severe; aCOPD exacerbation. Add Mucinex nebulizers for mucolytic effect. Lungs respiratory failure, on 2 L of oxygen, advised the patient and the nurse to use more oxygen when she ambulates up to 3 L.  #4. depression; continue home medicines  #5 GERD  #6 constipation;add lactulose,increased colace,continue warm rune juice,abdominal xray negative  For obstruction,  #7 anxiety;on XANAX  constipation: Continue stool softeners, lactulose.   All the records are reviewed and case discussed with Care Management/Social Workerr. Management plans discussed with the patient, family and they are in agreement.  CODE STATUS: full  TOTAL TIME TAKING CARE OF THIS PATIENT:35 minutes.   POSSIBLE D/C IN 1-2 DAYS, DEPENDING ON CLINICAL CONDITION.   Epifanio Lesches M.D on 10/02/2016 at 3:36 PM  Between 7am to 6pm - Pager - [phone]  After 6pm go to [email or web address] - password [email or web address] [facility]  Office  [phone]  CC: Primary care physician; Tonia Ghent, MD   Note: This dictation was prepared with Dragon dictation along with smaller phrase technology. Any  transcriptional errors that result from this process are unintentional.

## 2016-10-02 NOTE — Care Management Important Message (Signed)
Important Message  Patient Details  Name: Mckenzie Mclaughlin MRN: [id number] Date of Birth: 08/16/1940   Medicare Important Message Given:  Yes    Katrina Stack, RN 10/02/2016, 7:53 AM

## 2016-10-02 NOTE — Clinical Social Work Note (Signed)
Clinical Social Work Assessment  Patient Details  Name: Mckenzie Mclaughlin MRN: [id number] Date of Birth: 09-Sep-1940  Date of referral:  10/02/16               Reason for consult:  Facility Placement                Permission sought to share information with:  Facility Sport and exercise psychologist, Family Supports Permission granted to share information::  Yes, Verbal Permission Granted  Name::     Ruud,Richard Spouse [phone] or Gae Dry [phone]  [phone]   Agency::  SNF admissions  Relationship::     Contact Information:     Housing/Transportation Living arrangements for the past 2 months:  [place] of Information:  Patient Patient Interpreter Needed:  None Criminal Activity/Legal Involvement Pertinent to Current Situation/Hospitalization:  No - Comment as needed Significant Relationships:  Spouse Lives with:  Spouse Do you feel safe going back to the place where you live?  No Need for family participation in patient care:  No (Coment)  Care giving concerns: Patient and family feel she needs some short term rehab before she is able to return back home.   Social Worker assessment / plan:  Patient is a 76 year old female who is married and lives with her husband.  Patient is alert and oriented x4 and able to express her opinions.  CSW explained to patient role of CSW and process for looking for SNF.  Patient states she has been to rehab before and would prefer [facility], CSW explained to patient that if [facility] does not have any beds available CSW will have to look at other options for patient.  Patient stated she understands, and is in agreement to having CSW begin bed search process at other snfs in [facility].  Patient did not express any other questions or concerns.  Employment status:  Retired Nurse, adult PT Recommendations:  [place] / Referral to community resources:   [place]  Patient/Family's Response to care:  Patient is in agreement to going to SNF for short term rehab.  Patient/Family's Understanding of and Emotional Response to Diagnosis, Current Treatment, and Prognosis: Patient is hopeful that she will not have to be in SNF for very long.  Emotional Assessment Appearance:  Appears stated age Attitude/Demeanor/Rapport:    Affect (typically observed):  Appropriate, Calm, Pleasant Orientation:  Oriented to Self, Oriented to Place, Oriented to  Time, Oriented to Situation Alcohol / Substance use:  Not Applicable Psych involvement (Current and /or in the community):  No (Comment)  Discharge Needs  Concerns to be addressed:  Lack of Support Readmission within the last 30 days:  No Current discharge risk:  Lack of support system Barriers to Discharge:  Continued Medical Work up   Anell Barr 10/02/2016, 5:53 PM

## 2016-10-03 MED ORDER — POLYETHYLENE GLYCOL 3350 17 G PO PACK
17.0000 g | PACK | Freq: Every day | ORAL | Status: DC
  Filled 2016-10-03: qty 1

## 2016-10-03 MED ORDER — BISACODYL 10 MG RE SUPP
10.0000 mg | Freq: Once | RECTAL | Status: AC
  Administered 2016-10-03: 10 mg via RECTAL
  Filled 2016-10-03: qty 1

## 2016-10-03 NOTE — Progress Notes (Signed)
[place] at [place] NAME: Semiyah Newgent    MR#:  [id number]  DATE OF BIRTH:  1940-07-23  SUBJECTIVE; shortness of breath lately better, has cough and phlegm. Still has constipation did not move bowels yet. pas sign gas..  CHIEF COMPLAINT:   Chief Complaint  Patient presents with  . Chest Pain    REVIEW OF SYSTEMS:   ROS CONSTITUTIONAL: No fever, fatigue or weakness.  EYES: No blurred or double vision.  EARS, NOSE, AND THROAT: No tinnitus or ear pain.  RESPIRATORY: Cough, shortness of breath, wheezing, green phlegm.  CARDIOVASCULAR: No chest pain, orthopnea, edema.  GASTROINTESTINAL: No nausea, vomiting, diarrhea or abdominal pain.  C/o of constipation. GENITOURINARY: No dysuria.Patient has hematuria. ENDOCRINE: No polyuria, nocturia,  HEMATOLOGY: No anemia, easy bruising or bleeding SKIN: No rash or lesion. MUSCULOSKELETAL: No joint pain or arthritis.   NEUROLOGIC: No tingling, numbness, weakness.  PSYCHIATRY: No anxiety or depression.   DRUG ALLERGIES:   Allergies  Allergen Reactions  . Amoxicillin Shortness Of Breath  . Doxycycline Nausea Only and Other (See Comments)    Dizzy Reaction:  Dizziness   . Sertraline Hcl Nausea And Vomiting    REACTION: trembling    VITALS:  Blood pressure (!) 131/52, pulse 83, temperature 98.4 F (36.9 C), temperature source Oral, resp. rate 18, height 5\' 7"  (1.702 m), weight 66 kg (145 lb 8 oz), SpO2 95 %.  PHYSICAL EXAMINATION:  GENERAL:  76 y.o.-year-old patient lying in the bed with no acute distress.  EYES: Pupils equal, round, reactive to light . No scleral icterus. Extraocular muscles intact.  HEENT: Head atraumatic, normocephalic. Oropharynx and nasopharynx clear.  NECK:  Supple, no jugular venous distention. No thyroid enlargement, no tenderness.  LUNGS;Bilateral coarse breath sounds. CARDIOVASCULAR: S1, S2 normal. No murmurs, rubs, or gallops.  ABDOMEN: Soft, nontender,  nondistended. Bowel sounds present. No organomegaly or mass.  EXTREMITIES: No pedal edema, cyanosis, or clubbing.  NEUROLOGIC: Cranial nerves II through XII are intact. Muscle strength 5/5 in all extremities. Sensation intact. Gait not checked.  PSYCHIATRIC: The patient is alert and oriented x 3.  SKIN: No obvious rash, lesion, or ulcer.    LABORATORY PANEL:   CBC  Recent Labs Lab 09/27/16 0450  WBC 10.8  HGB 13.3  HCT 38.5  PLT 248   ------------------------------------------------------------------------------------------------------------------  Chemistries  No results for input(s): NA, K, CL, CO2, GLUCOSE, BUN, CREATININE, CALCIUM, MG, AST, ALT, ALKPHOS, BILITOT in the last 168 hours.  Invalid input(s): GFRCGP ------------------------------------------------------------------------------------------------------------------  Cardiac Enzymes No results for input(s): TROPONINI in the last 168 hours. ------------------------------------------------------------------------------------------------------------------  RADIOLOGY:  Dg Abd 1 View  Result Date: 10/01/2016 CLINICAL DATA:  76 year old female with a history of constipation EXAM: ABDOMEN - 1 VIEW COMPARISON:  CT 10/03/2015 FINDINGS: Gas within stomach, small bowel, colon. No abnormally distended small bowel or colon. Gas extends to the rectum. Formed stool within the right colon and proximal transverse colon. No displaced fracture.  Degenerative changes of the spine. Rounded radiopaque density in the left abdomen measures approximately 14- 15 mm, compatible with known left collecting system nephrolithiasis. IMPRESSION: Moderate stool burden without evidence of obstruction. Known left-sided nephrolithiasis Electronically Signed   By: Corrie Mckusick D.O.   On: 10/01/2016 13:32    EKG:   Orders placed or performed during the hospital encounter of 09/25/16  . ED EKG within 10 minutes  . ED EKG within 10 minutes  . EKG 12-Lead   . EKG 12-Lead  .  ED EKG  . ED EKG    ASSESSMENT AND PLAN:   : Left-sided chest pain Due to acute bronchitis. Troponins are negative for 3 times. Seen by cardiology. Added Ranexa for chronic angina. No further cardiac workup is planned.    Because of hematuria stopped the heparin drip.  . Patient had bradycardia witth HR 40  , no heart rate improved, started on Cardizem because of tachycardia secondary to her respiratory difficulties. Heart rate is stable now.   #2 .emphysema, incidental pulmonary nodule, dyspnea on exertion. Patient follows up with Dr. Raul Del.  Seen By pulmonary. Dr. Rosita Fire. Has  chronic respiratory failure and on oxygen 2 L all the time.  Now has  COPD exacerbation, Pseudomonas in the sputum. Continue nebulizers, Mucinex, weaned down the steroids.continue Levaquin.    .3. Pt requesting  skilled nursing facility, likely discharge on Monday when arrangements are made. #4. depression; continue home medicines  #5 GERD  #6 .constipation;add lactulose,increased colace,continue warm rune juice,abdominal xray negative  For obstruction,add miralax,if needed enemas.,  #7 anxiety;on XANAX  constipation: Continue stool softeners, lactulose.   All the records are reviewed and case discussed with Care Management/Social Workerr. Management plans discussed with the patient, family and they are in agreement.  CODE STATUS: full  TOTAL TIME TAKING CARE OF THIS PATIENT:35 minutes.   POSSIBLE D/C IN 1-2 DAYS, DEPENDING ON CLINICAL CONDITION.   Epifanio Lesches M.D on 10/03/2016 at 11:07 AM  Between 7am to 6pm - Pager - [phone]  After 6pm go to [email or web address] - password [email or web address] [facility]  Office  [phone]  CC: Primary care physician; Tonia Ghent, MD   Note: This dictation was prepared with Dragon dictation along with smaller phrase technology. Any transcriptional errors that result from this process are unintentional.

## 2016-10-03 NOTE — Clinical Social Work Note (Signed)
CSW spoke with patient at bedside to deliver bed offers for SNF. The patient reported that she is only interested in [facility]. If they are not able to accept, the patient would like to return home with home health. The CSW has alerted the admissions coordinator at [facility], and she is going to look into possible admission as soon as Monday. The CSW will inform the patient as more information is available.   Santiago Bumpers, MSW, [place] [phone]

## 2016-10-04 MED ORDER — BISACODYL 5 MG PO TBEC: 5.0000 mg | DELAYED_RELEASE_TABLET | Freq: Once | ORAL | Status: DC

## 2016-10-04 MED ORDER — MAGNESIUM HYDROXIDE 400 MG/5ML PO SUSP
30.0000 mL | Freq: Every day | ORAL | Status: DC
  Administered 2016-10-04 – 2016-10-05 (×2): 30 mL via ORAL
  Filled 2016-10-04 (×2): qty 30

## 2016-10-04 MED ORDER — BISACODYL 5 MG PO TBEC
5.0000 mg | DELAYED_RELEASE_TABLET | Freq: Every day | ORAL | Status: DC | PRN
  Administered 2016-10-04: 5 mg via ORAL
  Filled 2016-10-04: qty 1

## 2016-10-04 NOTE — Progress Notes (Signed)
[place] at [place] NAME: Mckenzie Mclaughlin    MR#:  [id number]  DATE OF BIRTH:  12-22-1940  SUBJECTIVE; still constipated, no BM yet. Says breathing is better. Abdomen is soft, no abdominal pain or vomiting. Marland Kitchen  CHIEF COMPLAINT:   Chief Complaint  Patient presents with  . Chest Pain    REVIEW OF SYSTEMS:   ROS CONSTITUTIONAL: No fever, fatigue or weakness.  EYES: No blurred or double vision.  EARS, NOSE, AND THROAT: No tinnitus or ear pain.  RESPIRATORY: Improving shortness of breath. Able to get the phlegm out.  CARDIOVASCULAR: No chest pain, orthopnea, edema.  GASTROINTESTINAL: No nausea, vomiting, diarrhea or abdominal pain.  C/o of constipation. GENITOURINARY: No dysuria.Patient has hematuria. ENDOCRINE: No polyuria, nocturia,  HEMATOLOGY: No anemia, easy bruising or bleeding SKIN: No rash or lesion. MUSCULOSKELETAL: No joint pain or arthritis.   NEUROLOGIC: No tingling, numbness, weakness.  PSYCHIATRY: No anxiety or depression.   DRUG ALLERGIES:   Allergies  Allergen Reactions  . Amoxicillin Shortness Of Breath  . Doxycycline Nausea Only and Other (See Comments)    Dizzy Reaction:  Dizziness   . Sertraline Hcl Nausea And Vomiting    REACTION: trembling    VITALS:  Blood pressure (!) 117/48, pulse 91, temperature 98.3 F (36.8 C), temperature source Oral, resp. rate 18, height 5\' 7"  (1.702 m), weight 66 kg (145 lb 8 oz), SpO2 97 %.  PHYSICAL EXAMINATION:  GENERAL:  76 y.o.-year-old patient lying in the bed with no acute distress.  EYES: Pupils equal, round, reactive to light . No scleral icterus. Extraocular muscles intact.  HEENT: Head atraumatic, normocephalic. Oropharynx and nasopharynx clear.  NECK:  Supple, no jugular venous distention. No thyroid enlargement, no tenderness.  LUNGS;Mostly clear to auscultation, no wheeze. CARDIOVASCULAR: S1, S2 normal. No murmurs, rubs, or gallops.  ABDOMEN: Soft,  nontender, nondistended. Bowel sounds present. No organomegaly or mass.  EXTREMITIES: No pedal edema, cyanosis, or clubbing.  NEUROLOGIC: Cranial nerves II through XII are intact. Muscle strength 5/5 in all extremities. Sensation intact. Gait not checked.  PSYCHIATRIC: The patient is alert and oriented x 3.  SKIN: No obvious rash, lesion, or ulcer.    LABORATORY PANEL:   CBC No results for input(s): WBC, HGB, HCT, PLT in the last 168 hours. ------------------------------------------------------------------------------------------------------------------  Chemistries  No results for input(s): NA, K, CL, CO2, GLUCOSE, BUN, CREATININE, CALCIUM, MG, AST, ALT, ALKPHOS, BILITOT in the last 168 hours.  Invalid input(s): GFRCGP ------------------------------------------------------------------------------------------------------------------  Cardiac Enzymes No results for input(s): TROPONINI in the last 168 hours. ------------------------------------------------------------------------------------------------------------------  RADIOLOGY:  No results found.  EKG:   Orders placed or performed during the hospital encounter of 09/25/16  . ED EKG within 10 minutes  . ED EKG within 10 minutes  . EKG 12-Lead  . EKG 12-Lead  . ED EKG  . ED EKG    ASSESSMENT AND PLAN:   : Left-sided chest pain Due to acute bronchitis. Troponins are negative for 3 times. Seen by cardiology. Added Ranexa for chronic angina. No further cardiac workup is planned.    Because of hematuria stopped the heparin drip.  . Patient had bradycardia witth HR 40  , no heart rate improved, started on Cardizem because of tachycardia secondary to her respiratory difficulties. Heart rate is stable now.   #2 .emphysema, incidental pulmonary nodule, dyspnea on exertion. Patient follows up with Dr. Raul Del.  Seen By pulmonary. Dr. Rosita Fire. Has  chronic respiratory failure and on oxygen  2 L all the time.  Now has  COPD  exacerbation, Pseudomonas in the sputum. Continue nebulizers, Mucinex, weaned down the steroids.continue Levaquin.    .3. Pt requesting  skilled nursing facility, likely discharge on Monday when arrangements are made. #4. depression; continue home medicines  #5 GERD  #6 .constipation;add lactulose,increased colace,continue warm rune juice,abdominal xray negative  For obstruction,add miralax,if needed enemas.,  #7 anxiety;on XANAX  constipation: Continue stool softeners, lactulose.; No improvement. Continue to watch, abdomen is soft.add dulocolax. Encouraged ambulation.  All the records are reviewed and case discussed with Care Management/Social Workerr. Management plans discussed with the patient, family and they are in agreement.  CODE STATUS: full  TOTAL TIME TAKING CARE OF THIS PATIENT:35 minutes.   POSSIBLE D/C IN 1-2 DAYS, DEPENDING ON CLINICAL CONDITION.   Epifanio Lesches M.D on 10/04/2016 at 10:40 AM  Between 7am to 6pm - Pager - [phone]  After 6pm go to [email or web address] - password [email or web address] [facility]  Office  [phone]  CC: Primary care physician; Tonia Ghent, MD   Note: This dictation was prepared with Dragon dictation along with smaller phrase technology. Any transcriptional errors that result from this process are unintentional.

## 2016-10-04 NOTE — Plan of Care (Signed)
Problem: Bowel/Gastric: Goal: Will not experience complications related to bowel motility Outcome: Not Progressing Patient experiencing constipation. Lactulose, miralax, and dulcolax given.

## 2016-10-05 ENCOUNTER — Encounter
Admission: RE | Admit: 2016-10-05 | Discharge: 2016-10-05 | Disposition: A | Discharge status: Home / Self Care | Payer: Medicare Other | Source: Ambulatory Visit | Attending: Internal Medicine | Admitting: Internal Medicine

## 2016-10-05 DIAGNOSIS — F419 Anxiety disorder, unspecified: Secondary | ICD-10-CM | POA: Diagnosis not present

## 2016-10-05 DIAGNOSIS — Z9981 Dependence on supplemental oxygen: Secondary | ICD-10-CM | POA: Diagnosis not present

## 2016-10-05 DIAGNOSIS — I25118 Atherosclerotic heart disease of native coronary artery with other forms of angina pectoris: Secondary | ICD-10-CM | POA: Diagnosis not present

## 2016-10-05 DIAGNOSIS — F329 Major depressive disorder, single episode, unspecified: Secondary | ICD-10-CM | POA: Diagnosis not present

## 2016-10-05 DIAGNOSIS — I25110 Atherosclerotic heart disease of native coronary artery with unstable angina pectoris: Secondary | ICD-10-CM | POA: Diagnosis not present

## 2016-10-05 DIAGNOSIS — R002 Palpitations: Secondary | ICD-10-CM | POA: Diagnosis not present

## 2016-10-05 DIAGNOSIS — M6281 Muscle weakness (generalized): Secondary | ICD-10-CM | POA: Diagnosis not present

## 2016-10-05 DIAGNOSIS — J441 Chronic obstructive pulmonary disease with (acute) exacerbation: Secondary | ICD-10-CM | POA: Diagnosis not present

## 2016-10-05 DIAGNOSIS — E785 Hyperlipidemia, unspecified: Secondary | ICD-10-CM | POA: Diagnosis not present

## 2016-10-05 DIAGNOSIS — I10 Essential (primary) hypertension: Secondary | ICD-10-CM | POA: Diagnosis not present

## 2016-10-05 DIAGNOSIS — I252 Old myocardial infarction: Secondary | ICD-10-CM | POA: Diagnosis not present

## 2016-10-05 DIAGNOSIS — R262 Difficulty in walking, not elsewhere classified: Secondary | ICD-10-CM | POA: Diagnosis not present

## 2016-10-05 DIAGNOSIS — I509 Heart failure, unspecified: Secondary | ICD-10-CM | POA: Diagnosis not present

## 2016-10-05 DIAGNOSIS — J9611 Chronic respiratory failure with hypoxia: Secondary | ICD-10-CM | POA: Diagnosis not present

## 2016-10-05 DIAGNOSIS — I493 Ventricular premature depolarization: Secondary | ICD-10-CM | POA: Diagnosis not present

## 2016-10-05 DIAGNOSIS — Z9911 Dependence on respirator [ventilator] status: Secondary | ICD-10-CM | POA: Diagnosis not present

## 2016-10-05 DIAGNOSIS — R531 Weakness: Secondary | ICD-10-CM | POA: Diagnosis not present

## 2016-10-05 DIAGNOSIS — I200 Unstable angina: Secondary | ICD-10-CM | POA: Diagnosis not present

## 2016-10-05 DIAGNOSIS — Z85828 Personal history of other malignant neoplasm of skin: Secondary | ICD-10-CM | POA: Diagnosis not present

## 2016-10-05 DIAGNOSIS — R001 Bradycardia, unspecified: Secondary | ICD-10-CM | POA: Diagnosis not present

## 2016-10-05 DIAGNOSIS — J9690 Respiratory failure, unspecified, unspecified whether with hypoxia or hypercapnia: Secondary | ICD-10-CM | POA: Diagnosis not present

## 2016-10-05 DIAGNOSIS — J209 Acute bronchitis, unspecified: Secondary | ICD-10-CM | POA: Diagnosis not present

## 2016-10-05 DIAGNOSIS — Z955 Presence of coronary angioplasty implant and graft: Secondary | ICD-10-CM | POA: Diagnosis not present

## 2016-10-05 DIAGNOSIS — J449 Chronic obstructive pulmonary disease, unspecified: Secondary | ICD-10-CM | POA: Diagnosis not present

## 2016-10-05 DIAGNOSIS — Z7902 Long term (current) use of antithrombotics/antiplatelets: Secondary | ICD-10-CM | POA: Diagnosis not present

## 2016-10-05 DIAGNOSIS — Z9889 Other specified postprocedural states: Secondary | ICD-10-CM | POA: Diagnosis not present

## 2016-10-05 DIAGNOSIS — Z7982 Long term (current) use of aspirin: Secondary | ICD-10-CM | POA: Diagnosis not present

## 2016-10-05 DIAGNOSIS — G4733 Obstructive sleep apnea (adult) (pediatric): Secondary | ICD-10-CM | POA: Diagnosis not present

## 2016-10-05 LAB — BASIC METABOLIC PANEL
Anion gap: 5 (ref 5–15)
BUN: 25 mg/dL — ABNORMAL HIGH (ref 6–20)
CALCIUM: 8.8 mg/dL — AB (ref 8.9–10.3)
CHLORIDE: 95 mmol/L — AB (ref 101–111)
CO2: 37 mmol/L — ABNORMAL HIGH (ref 22–32)
CREATININE: 0.97 mg/dL (ref 0.44–1.00)
GFR calc non Af Amer: 57 mL/min — ABNORMAL LOW (ref >60)
Glucose, Bld: 121 mg/dL — ABNORMAL HIGH (ref 65–99)
Potassium: 4.7 mmol/L (ref 3.5–5.1)
SODIUM: 137 mmol/L (ref 135–145)

## 2016-10-05 LAB — CBC
HCT: 41.1 % (ref 35.0–47.0)
HEMOGLOBIN: 13.5 g/dL (ref 12.0–16.0)
MCH: 29.3 pg (ref 26.0–34.0)
MCHC: 32.8 g/dL (ref 32.0–36.0)
MCV: 89.3 fL (ref 80.0–100.0)
Platelets: 221 10*3/uL (ref 150–440)
RBC: 4.61 MIL/uL (ref 3.80–5.20)
RDW: 13 % (ref 11.5–14.5)
WBC: 11.8 10*3/uL — ABNORMAL HIGH (ref 3.6–11.0)

## 2016-10-05 MED ORDER — MAGNESIUM HYDROXIDE 400 MG/5ML PO SUSP: 30.0000 mL | Freq: Every day | ORAL | 0 refills | Status: AC

## 2016-10-05 MED ORDER — VENLAFAXINE HCL 37.5 MG PO TABS: 37.5000 mg | ORAL_TABLET | Freq: Every day | ORAL | 0 refills | Status: DC

## 2016-10-05 MED ORDER — IPRATROPIUM-ALBUTEROL 0.5-2.5 (3) MG/3ML IN SOLN: 3.0000 mL | RESPIRATORY_TRACT | 0 refills | Status: DC | PRN

## 2016-10-05 MED ORDER — RANOLAZINE ER 500 MG PO TB12: 500.0000 mg | ORAL_TABLET | Freq: Two times a day (BID) | ORAL | 0 refills | Status: DC

## 2016-10-05 MED ORDER — LACTULOSE 10 GM/15ML PO SOLN: 20.0000 g | Freq: Two times a day (BID) | ORAL | 0 refills | Status: DC

## 2016-10-05 MED ORDER — DILTIAZEM HCL 60 MG PO TABS: 60.0000 mg | ORAL_TABLET | Freq: Three times a day (TID) | ORAL | 0 refills | Status: DC

## 2016-10-05 MED ORDER — DOCUSATE SODIUM 100 MG PO CAPS: 200.0000 mg | ORAL_CAPSULE | Freq: Two times a day (BID) | ORAL | 0 refills | Status: DC

## 2016-10-05 MED ORDER — FUROSEMIDE 40 MG PO TABS: 40.0000 mg | ORAL_TABLET | Freq: Every day | ORAL | 0 refills | Status: DC

## 2016-10-05 MED ORDER — LEVOFLOXACIN 500 MG PO TABS
500.0000 mg | ORAL_TABLET | Freq: Every day | ORAL | 0 refills | Status: DC
Start: 2016-10-05 — End: 2016-10-12

## 2016-10-05 MED ORDER — ALPRAZOLAM 0.5 MG PO TABS: 0.5000 mg | ORAL_TABLET | Freq: Three times a day (TID) | ORAL | 0 refills | Status: DC | PRN

## 2016-10-05 NOTE — Clinical Social Work Note (Signed)
Patient to be d/c'ed today to [facility].  Patient and family agreeable to plans will transport via ems RN to call report to room 208A [phone].  Evette Cristal, MSW, [place]

## 2016-10-05 NOTE — Care Management Important Message (Signed)
Important Message  Patient Details  Name: Mckenzie Mclaughlin MRN: [id number] Date of Birth: [birth year]   Medicare Important Message Given:  Yes  Signed IM notice given   Katrina Stack, RN 10/05/2016, 10:21 AM

## 2016-10-05 NOTE — Progress Notes (Signed)
Physical Therapy Treatment Patient Details Name: ELNORIA LIVINGSTON MRN: [id number] DOB: 04/08/1940 Today's Date: 10/05/2016    History of Present Illness 76 y/o female admitted for chest pain on 09/25/16. Pt found to have symptomatic bradycardia (HR in 40's) and was diagnosed with unstable angina. Negative for any acute cardiac disease. Pt begun have hematuria on 09/26/16, which pt has had on/off prior to hospital admission (abdominal CT scan pending). Negative for pneumonia, so pt being treated for COPD exacerbation. PMH includes COPD (O2 dep), CAD, skin cancer, anxiety, depression, dysrhythmia, GERD, head injury with LOC, ETT, HTN, cardiac cath, pneumothorax, and aortic stunt.     PT Comments    Pt progressing toward her goals. Pt able to perform sit to/from stand with supervision on 1st two repetitions performed before and after ambulating 71ft. However, pt required min guard-min assist for transfer off toilet due to weakness noted. Pt ambulated 53ft (2 times) with RW on 1L O2. Slow gait speed noted that is well below pt's baseline (nor does she ambulate with an AD at home). SAO2 remained above 93% and HR remained below 125bpm this session. Pt continues to recommend dc to SNF given pt's status well below her baseline. Will continue to progress.    Follow Up Recommendations  SNF     Equipment Recommendations  None recommended by PT    Recommendations for Other Services       Precautions / Restrictions Precautions Precautions: Fall Restrictions Weight Bearing Restrictions: No    Mobility  Bed Mobility Overal bed mobility: Needs Assistance Bed Mobility: Supine to Sit     Supine to sit: Supervision     General bed mobility comments: Supine to EOB with supervision for safety. No dizziness noted once EOB. Performed on 1L O2. SAO2 remained above 93%.  Transfers Overall transfer level: Needs assistance Equipment used: Rolling walker (2 wheeled) Transfers: Sit to/from Stand Sit to  Stand: Supervision;Min guard         General transfer comment: Supervision for 1st 2 sit to/from stands. Min guard-min assist for sit to/from stand off toilet due to weakness coming to standing position. Performed on 1L O2. SAO2 remained over 93%.   Ambulation/Gait Ambulation/Gait assistance: Min guard Ambulation Distance (Feet): 30 Feet Assistive device: Rolling walker (2 wheeled) Gait Pattern/deviations: Step-through pattern;Trunk flexed;Wide base of support     General Gait Details: Ambulated 95ft with RW and min guard on 1L O2. Pt required seated rest break for 1-69min due to fatigue. SAO2 remained above 93% while ambulating and HR remained below 125bpm. Pt then able to ambulate 30ft again with slow gait speed noted. Ambulated from toilet to bed (68ft) with RW and min guard. Min guard for toilet hygiene due to weakness noted with sit to/from stand on toilet.   Stairs            Wheelchair Mobility    Modified Rankin (Stroke Patients Only)       Balance Overall balance assessment: No apparent balance deficits (not formally assessed)                                          Cognition Arousal/Alertness: Awake/alert Behavior During Therapy: WFL for tasks assessed/performed Overall Cognitive Status: Within Functional Limits for tasks assessed  Exercises Other Exercises Other Exercises: Supine ther-ex x12 B included ankle pumps, SLR's and hip abd. Performed with supervision on 1L O2. SAO2 remained above 93% and HR remained below 101bpm.    General Comments        Pertinent Vitals/Pain Pain Assessment: No/denies pain    Home Living                      Prior Function            PT Goals (current goals can now be found in the care plan section) Acute Rehab PT Goals Patient Stated Goal: to get stronger PT Goal Formulation: With patient Time For Goal Achievement:  10/15/16 Potential to Achieve Goals: Good Progress towards PT goals: Progressing toward goals    Frequency    Min 2X/week      PT Plan Current plan remains appropriate    Co-evaluation              AM-PAC PT "6 Clicks" Daily Activity  Outcome Measure  Difficulty turning over in bed (including adjusting bedclothes, sheets and blankets)?: A Little Difficulty moving from lying on back to sitting on the side of the bed? : A Little Difficulty sitting down on and standing up from a chair with arms (e.g., wheelchair, bedside commode, etc,.)?: Total Help needed moving to and from a bed to chair (including a wheelchair)?: A Little Help needed walking in hospital room?: A Little Help needed climbing 3-5 steps with a railing? : A Lot 6 Click Score: 15    End of Session Equipment Utilized During Treatment: Gait belt;Oxygen Activity Tolerance: Patient tolerated treatment well Patient left: in chair;with call bell/phone within reach Nurse Communication: Mobility status PT Visit Diagnosis: Muscle weakness (generalized) (M62.81);Other abnormalities of gait and mobility (R26.89)     Time: 9758-8325 PT Time Calculation (min) (ACUTE ONLY): 26 min  Charges:                       G Codes:       Donaciano Eva, PT, SPT  Amori Colomb 10/05/2016, 11:26 AM

## 2016-10-05 NOTE — Clinical Social Work Note (Signed)
CSW was informed that patient prefers [employer], [place] contacted [facility] and they can accept patient today if she is medically ready for discharge and orders have been received.  Jones Broom. Stella, MSW, [place]  10/05/2016 10:27 AM

## 2016-10-05 NOTE — Discharge Summary (Signed)
Mckenzie Mclaughlin, is a 76 y.o. female  DOB 11/11/1940  MRN [id number].  Admission date:  09/25/2016  Admitting Physician  Loletha Grayer, MD  Discharge Date:  10/05/2016   Primary MD  Tonia Ghent, MD  Recommendations for primary care physician for things to follow:   Discharged to [facility], follow-up with primary doctor in one week Follow up with Dr. Clayborn Bigness in 2 weeks   Admission Diagnosis  Precordial pain [R07.2] Symptomatic bradycardia [R00.1] B12 deficiency [E53.8]   Discharge Diagnosis  Precordial pain [R07.2] Symptomatic bradycardia [R00.1] B12 deficiency [E53.8]    Active Problems:   Unstable angina [facility])      Past Medical History:  Diagnosis Date  . Anginal pain [facility])    see dr [name]  "Spams"  . Anxiety   . Arthritis   . Cancer (HCC)    side of head- skin cancer, squamous cell ca on left foot.  . Complication of anesthesia   . Constipation   . COPD (chronic obstructive pulmonary disease) ([place])   . COPD, severe 07/29/98  . Coronary artery disease 01/22/99   Non Q-wave MI, stent RCA  . Depression   . Depression   . Dysrhythmia    Palpations at times. last time 10/29 ish 2014  . Esophageal dilatation    Pt needs appple sauce to take meds.  . Esophageal stricture    PT needs apple sauce to take meds  . GERD (gastroesophageal reflux disease) 08/29/98   severe-no meds now  . Head injury, acute, with loss of consciousness ([place]) 11/2012   unsure how long  . History of blood transfusion   . History of blurry vision 05/06-05/10/2009   Hospital [facility] CP R/O'D Blurry vision, smoking  . History of CT scan of head 08/02/09   w/o mild age appropr atrophy  . History of ETT 08/03/09   Myoview Normal  . History of ETT 05/1999   Cardiolite pos ETT, neg Cardiolite  . History of ETT 09/02/01    Normal  . History of ETT 04/28/2006   Myoview normal EF 83%  . History of ETT 04/10/11   normal EF and no ischemia per Dr. Saralyn Pilar  . Hx of cardiac catheterization 08/29/01   60% RCA 0/w 20-30% lesions  . Hx of cardiac catheterization 01/22/99   w/Stent 90% RCA lesion  . Hypertension    03/30/00  . Mental disorder   . On home oxygen therapy    as needed-has a travel tank  . Pneumothorax 2/14  . PONV (postoperative nausea and vomiting)    ad breast remocved and see was under anesthesia a long time  . Shortness of breath   . Status post aortic coarctation stent placement     Past Surgical History:  Procedure Laterality Date  . ABDOMINAL HYSTERECTOMY    . BACK SURGERY    . BREAST SURGERY     implant removal, R breast  . CAROTID STENT Right 2000  . CATARACT EXTRACTION Bilateral    2016  . COLONOSCOPY    . ESOPHAGEAL DILATION  06/00   EGD  . LAMINECTOMY  1976   Disc Removal X 2 Lumbar  . MASTECTOMY  03/1976   Bilateral due FCBD with implants [facility])  . OOPHORECTOMY    . TISSUE EXPANDER PLACEMENT Right 02/03/2013   Procedure: REMOVAL OF RIGHT BREAST IMPLANT AND IMPLANT MATERIAL  CAPSULECTOMY;  Surgeon: Irene Limbo, MD;  Location: [place];  Service: Plastics;  Laterality: Right;  History of present illness and  Hospital Course:     Kindly see H&P for history of present illness and admission details, please review complete Labs, Consult reports and Test reports for all details in brief  HPI  from the history and physical done on the day of admission 2 68-year-old female patient with end-stage COPD admitted for left-sided chest pain. Admitted to telemetry for chest pain evaluation.   Hospital Course  #1 acute bronchitis. Troponins negative for 3 times. Seen by cardiology. Added Ranexa for chronic angina. Initially he was on heparin drip but stopped because of hematuria.  #2 bradycardia on admission with heart rate 40s. Patient was on metoprolol at home so we  held the metoprolol, heart rate improved patient did have tachycardia started on Cardizem. And it's helping her. She is on Cardizem 60 mg every 8 hours.   #3 COPD exacerbation, acute bronchitis, Pseudomonas in the sputum. Patient has chronic respiratory failure, oxygen 2 L at rest and increased to 3 L on ambulation. Seen by pulmonary. Patient is on oxygen at home chronically at 2 L. She was given IV steroids, nebulizers, Mucinex, Levaquin. She is feeling better, able to get the phlegm out, continue Levaquin for 5 days," dilators as per discharge instructions. Less wheezing today.   #4, deconditioning: Patient requested to go to rehabilitation, she will go to [facility] rehabilitation.  5. severe constipation. Abdominal x-ray did not show obstruction. Patient is on multiple stool softeners.     Discharge Condition: stable   Follow UP   Contact information for follow-up providers    Tonia Ghent, MD. Go on 10/09/2016.   Specialty:  Family Medicine Why:  Time: 12:15 p.m. Contact information: [place] [address] [phone]        Isaias Cowman, MD. Daphane Shepherd on 10/16/2016.   Specialty:  Cardiology Why:  Time: 11:00 a.m. Contact information: [facility] [place] [address] [phone]            Contact information for after-discharge care    Destination    [facility] Follow up.   Specialty:  [place] information: [address] [place] [phone]                    Discharge Instructions  and  Discharge Medications      Allergies as of 10/05/2016      Reactions   Amoxicillin Shortness Of Breath   Doxycycline Nausea Only, Other (See Comments)   Dizzy Reaction:  Dizziness    Sertraline Hcl Nausea And Vomiting   REACTION: trembling      Medication List    STOP taking these medications    metoprolol succinate 50 MG 24 hr tablet Commonly known as:  TOPROL-XL     TAKE these medications   albuterol 108 (90 Base) MCG/ACT inhaler Commonly known as:  PROAIR HFA INHALE TWO PUFFS BY MOUTH EVERY 6 HOURS AS NEEDED   ALPRAZolam 0.5 MG tablet Commonly known as:  XANAX Take 1 tablet (0.5 mg total) by mouth 3 (three) times daily as needed. What changed:  See the new instructions.   aspirin EC 81 MG tablet Take 81 mg by mouth daily.   atorvastatin 20 MG tablet Commonly known as:  LIPITOR TAKE 1 TABLET BY MOUTH ONCE A DAY   budesonide-formoterol 160-4.5 MCG/ACT inhaler Commonly known as:  SYMBICORT Inhale 2 puffs into the lungs 2 (  two) times daily.   clopidogrel 75 MG tablet Commonly known as:  PLAVIX TAKE 1 TABLET BY MOUTH ONCE A DAY   diltiazem 60 MG tablet Commonly known as:  CARDIZEM Take 1 tablet (60 mg total) by mouth every 8 (eight) hours.   docusate sodium 100 MG capsule Commonly known as:  COLACE Take 2 capsules (200 mg total) by mouth 2 (two) times daily.   furosemide 20 MG tablet Commonly known as:  LASIX Take 20 mg by mouth daily. What changed:  Another medication with the same name was added. Make sure you understand how and when to take each.   furosemide 40 MG tablet Commonly known as:  LASIX Take 1 tablet (40 mg total) by mouth daily. What changed:  You were already taking a medication with the same name, and this prescription was added. Make sure you understand how and when to take each.   ipratropium-albuterol 0.5-2.5 (3) MG/3ML Soln Commonly known as:  DUONEB Take 3 mLs by nebulization every 4 (four) hours as needed.   lactulose 10 GM/15ML solution Commonly known as:  CHRONULAC Place 30 mLs (20 g total) into feeding tube 2 (two) times daily.   levofloxacin 500 MG tablet Commonly known as:  LEVAQUIN Take 1 tablet (500 mg total) by mouth daily.   magnesium hydroxide 400 MG/5ML suspension Commonly known as:  MILK OF MAGNESIA Take 30 mLs  by mouth daily.   meclizine 25 MG tablet Commonly known as:  ANTIVERT Take 1 tablet (25 mg total) by mouth 3 (three) times daily as needed for dizziness.   nitroGLYCERIN 0.4 MG SL tablet Commonly known as:  NITROSTAT Place 1 tablet (0.4 mg total) under the tongue every 5 (five) minutes as needed for chest pain (max 3 doses in 15 min).   polyethylene glycol powder powder Commonly known as:  GLYCOLAX/MIRALAX Take by mouth.   ranolazine 500 MG 12 hr tablet Commonly known as:  RANEXA Take 1 tablet (500 mg total) by mouth 2 (two) times daily.   SPIRIVA HANDIHALER 18 MCG inhalation capsule Generic drug:  tiotropium INHALE ONE DOSE ONCE DAILY   venlafaxine 37.5 MG tablet Commonly known as:  EFFEXOR Take 1 tablet (37.5 mg total) by mouth daily at 10 pm. What changed:  See the new instructions.         Diet and Activity recommendation: See Discharge Instructions above   Consults obtained - cardio Pulmonary PT  Major procedures and Radiology Reports - PLEASE review detailed and final reports for all details, in brief -     Dg Chest 1 View  Result Date: 09/28/2016 CLINICAL DATA:  Chest pain and bradycardia EXAM: CHEST 1 VIEW COMPARISON:  September 25, 2016. FINDINGS: There is scarring in the right base. There is no edema or consolidation. Heart size and pulmonary vascularity are normal. No adenopathy. There is a breast implant on the left. There is aortic atherosclerosis. No bone lesions. No pneumothorax. IMPRESSION: Scarring right base. No edema or consolidation. Stable cardiac silhouette. There is aortic atherosclerosis. Aortic Atherosclerosis (ICD10-I70.0). Electronically Signed   By: Lowella Grip III M.D.   On: 09/28/2016 15:02   Dg Chest 2 View  Result Date: 09/25/2016 CLINICAL DATA:  Chest heaviness and shortness of Breath EXAM: CHEST  2 VIEW COMPARISON:  06/15/2016 FINDINGS: Cardiac shadow is stable. Left breast implant is noted. Aortic calcifications are again seen.  Diffuse interstitial changes are seen without focal infiltrate or sizable effusion. No acute bony abnormality is noted. IMPRESSION: Chronic changes without  acute abnormality. Electronically Signed   By: Inez Catalina M.D.   On: 09/25/2016 15:14   Dg Abd 1 View  Result Date: 10/01/2016 CLINICAL DATA:  76 year old female with a history of constipation EXAM: ABDOMEN - 1 VIEW COMPARISON:  CT 10/03/2015 FINDINGS: Gas within stomach, small bowel, colon. No abnormally distended small bowel or colon. Gas extends to the rectum. Formed stool within the right colon and proximal transverse colon. No displaced fracture.  Degenerative changes of the spine. Rounded radiopaque density in the left abdomen measures approximately 14- 15 mm, compatible with known left collecting system nephrolithiasis. IMPRESSION: Moderate stool burden without evidence of obstruction. Known left-sided nephrolithiasis Electronically Signed   By: Corrie Mckusick D.O.   On: 10/01/2016 13:32   Ct Head Wo Contrast  Result Date: 09/25/2016 CLINICAL DATA:  Chest heaviness beginning yesterday.  Dizziness. EXAM: CT HEAD WITHOUT CONTRAST TECHNIQUE: Contiguous axial images were obtained from the base of the skull through the vertex without intravenous contrast. COMPARISON:  05/14/2016 FINDINGS: Brain: Age related atrophy. Mild to moderate chronic small-vessel ischemic change of the deep white matter. No sign of acute infarction, mass lesion, hemorrhage, hydrocephalus or extra-axial collection. Vascular: There is atherosclerotic calcification of the major vessels at the base of the brain. Skull: Negative Sinuses/Orbits: Clear/normal. Other: None significant IMPRESSION: Atrophy and chronic small-vessel ischemic changes. No acute finding by CT. No finding to explain dizziness. Electronically Signed   By: Nelson Chimes M.D.   On: 09/25/2016 14:55    Micro Results     Recent Results (from the past 240 hour(s))  Culture, expectorated sputum-assessment      Status: None   Collection Time: 09/28/16  3:09 PM  Result Value Ref Range Status   Specimen Description SPU  Final   Special Requests Normal  Final   Sputum evaluation THIS SPECIMEN IS ACCEPTABLE FOR SPUTUM CULTURE  Final   Report Status 09/28/2016 FINAL  Final  Culture, respiratory (NON-Expectorated)     Status: None   Collection Time: 09/28/16  3:09 PM  Result Value Ref Range Status   Specimen Description SPU  Final   Special Requests Normal Reflexed from [id number]  Final   Gram Stain   Final    ABUNDANT WBC PRESENT,BOTH PMN AND MONONUCLEAR ABUNDANT GRAM POSITIVE RODS ABUNDANT GRAM NEGATIVE RODS ABUNDANT GRAM POSITIVE COCCI IN PAIRS RARE GRAM NEGATIVE COCCI IN PAIRS Performed at [facility], [place] [address] [place], [place] [address]    Culture   Final    ABUNDANT STAPHYLOCOCCUS AUREUS MODERATE PSEUDOMONAS AERUGINOSA    Report Status 10/01/2016 FINAL  Final   Organism ID, Bacteria STAPHYLOCOCCUS AUREUS  Final   Organism ID, Bacteria PSEUDOMONAS AERUGINOSA  Final      Susceptibility   Pseudomonas aeruginosa - MIC*    CEFTAZIDIME 4 SENSITIVE Sensitive     CIPROFLOXACIN <=0.25 SENSITIVE Sensitive     GENTAMICIN <=1 SENSITIVE Sensitive     IMIPENEM 1 SENSITIVE Sensitive     PIP/TAZO 8 SENSITIVE Sensitive     CEFEPIME 2 SENSITIVE Sensitive     * MODERATE PSEUDOMONAS AERUGINOSA   Staphylococcus aureus - MIC*    CIPROFLOXACIN <=0.5 SENSITIVE Sensitive     ERYTHROMYCIN <=0.25 SENSITIVE Sensitive     GENTAMICIN <=0.5 SENSITIVE Sensitive     OXACILLIN 0.5 SENSITIVE Sensitive     TETRACYCLINE <=1 SENSITIVE Sensitive     VANCOMYCIN <=0.5 SENSITIVE Sensitive     TRIMETH/SULFA <=10 SENSITIVE Sensitive     CLINDAMYCIN <=0.25 SENSITIVE Sensitive  RIFAMPIN <=0.5 SENSITIVE Sensitive     Inducible Clindamycin NEGATIVE Sensitive     * ABUNDANT STAPHYLOCOCCUS AUREUS       Today   Subjective:   [place] today ,stable  for discharge to rehab.  Objective:   Blood  pressure (!) 127/52, pulse 81, temperature 98.7 F (37.1 C), temperature source Oral, resp. rate 20, height 5\' 7"  (1.702 m), weight 66 kg (145 lb 8 oz), SpO2 100 %.   Intake/Output Summary (Last 24 hours) at 10/05/16 1156 Last data filed at 10/05/16 0947  Gross per 24 hour  Intake              480 ml  Output              400 ml  Net               80 ml    Exam Awake Alert, Oriented x 3, No new F.N deficits, Normal affect [place].AT,PERRAL Supple Neck,No JVD, No cervical lymphadenopathy appriciated.  Symmetrical Chest wall movement, Good air movement bilaterally, CTAB RRR,No Gallops,Rubs or new Murmurs, No Parasternal Heave +ve B.Sounds, Abd Soft, Non tender, No organomegaly appriciated, No rebound -guarding or rigidity. No Cyanosis, Clubbing or edema, No new Rash or bruise  Data Review   CBC w Diff: Lab Results  Component Value Date   WBC 11.8 (H) 10/05/2016   HGB 13.5 10/05/2016   HGB 13.7 03/25/2014   HCT 41.1 10/05/2016   HCT 42.9 03/25/2014   PLT 221 10/05/2016   PLT 230 03/25/2014   LYMPHOPCT 21 09/25/2016   LYMPHOPCT 7.4 12/06/2013   MONOPCT 6 09/25/2016   MONOPCT 0.6 12/06/2013   EOSPCT 1 09/25/2016   EOSPCT 0.0 12/06/2013   BASOPCT 1 09/25/2016   BASOPCT 0.2 12/06/2013    CMP: Lab Results  Component Value Date   NA 137 10/05/2016   NA 138 03/25/2014   K 4.7 10/05/2016   K 4.1 03/25/2014   CL 95 (L) 10/05/2016   CL 104 03/25/2014   CO2 37 (H) 10/05/2016   CO2 28 03/25/2014   BUN 25 (H) 10/05/2016   BUN 18 03/25/2014   CREATININE 0.97 10/05/2016   CREATININE 0.84 03/25/2014   PROT 7.9 09/25/2016   PROT 7.1 03/25/2014   ALBUMIN 4.0 09/25/2016   ALBUMIN 3.2 (L) 03/25/2014   BILITOT 0.6 09/25/2016   BILITOT 0.5 03/25/2014   ALKPHOS 85 09/25/2016   ALKPHOS 85 03/25/2014   AST 32 09/25/2016   AST 34 03/25/2014   ALT 22 09/25/2016   ALT 32 03/25/2014  .   Total Time in preparing paper work, data evaluation and todays exam - 37  minutes  Vendela Troung M.D on 10/05/2016 at 11:56 AM    Note: This dictation was prepared with Dragon dictation along with smaller phrase technology. Any transcriptional errors that result from this process are unintentional.

## 2016-10-05 NOTE — Progress Notes (Signed)
Pt to be discharged to [facility] this afternoon. Iv and tele removed. Pt had large bm earlier. Report called to brittney at the facility. Transport by e.m.s.

## 2016-10-06 DIAGNOSIS — J9611 Chronic respiratory failure with hypoxia: Secondary | ICD-10-CM | POA: Diagnosis not present

## 2016-10-06 DIAGNOSIS — R531 Weakness: Secondary | ICD-10-CM | POA: Diagnosis not present

## 2016-10-06 DIAGNOSIS — J441 Chronic obstructive pulmonary disease with (acute) exacerbation: Secondary | ICD-10-CM | POA: Diagnosis not present

## 2016-10-06 DIAGNOSIS — R001 Bradycardia, unspecified: Secondary | ICD-10-CM | POA: Diagnosis not present

## 2016-10-06 DIAGNOSIS — I25118 Atherosclerotic heart disease of native coronary artery with other forms of angina pectoris: Secondary | ICD-10-CM | POA: Diagnosis not present

## 2016-10-09 ENCOUNTER — Non-Acute Institutional Stay (SKILLED_NURSING_FACILITY): Payer: Medicare Other | Admitting: Gerontology

## 2016-10-09 ENCOUNTER — Ambulatory Visit: Payer: Medicare Other | Admitting: Family Medicine

## 2016-10-09 DIAGNOSIS — F32A Depression, unspecified: Secondary | ICD-10-CM

## 2016-10-09 DIAGNOSIS — J449 Chronic obstructive pulmonary disease, unspecified: Secondary | ICD-10-CM | POA: Diagnosis not present

## 2016-10-09 DIAGNOSIS — Z0289 Encounter for other administrative examinations: Secondary | ICD-10-CM

## 2016-10-09 DIAGNOSIS — F329 Major depressive disorder, single episode, unspecified: Secondary | ICD-10-CM | POA: Diagnosis not present

## 2016-10-09 DIAGNOSIS — F419 Anxiety disorder, unspecified: Secondary | ICD-10-CM

## 2016-10-09 DIAGNOSIS — E785 Hyperlipidemia, unspecified: Secondary | ICD-10-CM

## 2016-10-10 ENCOUNTER — Other Ambulatory Visit
Admission: RE | Admit: 2016-10-10 | Discharge: 2016-10-10 | Disposition: A | Discharge status: Home / Self Care | Payer: Medicare Other | Source: Skilled Nursing Facility | Attending: Gerontology | Admitting: Gerontology

## 2016-10-10 DIAGNOSIS — J449 Chronic obstructive pulmonary disease, unspecified: Secondary | ICD-10-CM | POA: Insufficient documentation

## 2016-10-10 DIAGNOSIS — E785 Hyperlipidemia, unspecified: Secondary | ICD-10-CM | POA: Insufficient documentation

## 2016-10-10 LAB — COMPREHENSIVE METABOLIC PANEL
ALBUMIN: 3.1 g/dL — AB (ref 3.5–5.0)
ALK PHOS: 61 U/L (ref 38–126)
ALT: 17 U/L (ref 14–54)
AST: 21 U/L (ref 15–41)
Anion gap: 7 (ref 5–15)
BUN: 22 mg/dL — ABNORMAL HIGH (ref 6–20)
CALCIUM: 9.7 mg/dL (ref 8.9–10.3)
CHLORIDE: 97 mmol/L — AB (ref 101–111)
CO2: 34 mmol/L — AB (ref 22–32)
CREATININE: 1.2 mg/dL — AB (ref 0.44–1.00)
GFR calc non Af Amer: 44 mL/min — ABNORMAL LOW (ref >60)
GFR, EST AFRICAN AMERICAN: 51 mL/min — AB (ref >60)
GLUCOSE: 119 mg/dL — AB (ref 65–99)
Potassium: 4.7 mmol/L (ref 3.5–5.1)
SODIUM: 138 mmol/L (ref 135–145)
Total Bilirubin: 0.4 mg/dL (ref 0.3–1.2)
Total Protein: 6.2 g/dL — ABNORMAL LOW (ref 6.5–8.1)

## 2016-10-10 LAB — LIPID PANEL
Cholesterol: 125 mg/dL (ref 0–200)
HDL: 52 mg/dL (ref >40)
LDL CALC: 55 mg/dL (ref 0–99)
TRIGLYCERIDES: 89 mg/dL (ref <150)
Total CHOL/HDL Ratio: 2.4 RATIO
VLDL: 18 mg/dL (ref 0–40)

## 2016-10-10 LAB — CBC WITH DIFFERENTIAL/PLATELET
BASOS ABS: 0 10*3/uL (ref 0–0.1)
BASOS PCT: 0 %
EOS ABS: 0.1 10*3/uL (ref 0–0.7)
EOS PCT: 1 %
HCT: 39 % (ref 35.0–47.0)
HEMOGLOBIN: 12.9 g/dL (ref 12.0–16.0)
LYMPHS ABS: 1.7 10*3/uL (ref 1.0–3.6)
Lymphocytes Relative: 14 %
MCH: 29 pg (ref 26.0–34.0)
MCHC: 33.1 g/dL (ref 32.0–36.0)
MCV: 87.6 fL (ref 80.0–100.0)
Monocytes Absolute: 1.2 10*3/uL — ABNORMAL HIGH (ref 0.2–0.9)
Monocytes Relative: 10 %
NEUTROS PCT: 75 %
Neutro Abs: 9.1 10*3/uL — ABNORMAL HIGH (ref 1.4–6.5)
PLATELETS: 207 10*3/uL (ref 150–440)
RBC: 4.46 MIL/uL (ref 3.80–5.20)
RDW: 13.5 % (ref 11.5–14.5)
WBC: 12 10*3/uL — AB (ref 3.6–11.0)

## 2016-10-12 ENCOUNTER — Encounter: Payer: Self-pay | Admitting: Gerontology

## 2016-10-12 NOTE — Progress Notes (Signed)
Location:   [facility] Room Number: [place] of Service:  SNF [phone]) Provider:  Toni Arthurs, NP-C  Tonia Ghent, MD  Patient Care Team: Tonia Ghent, MD as PCP - General (Family Medicine) Grace Isaac, MD as Consulting Physician (Cardiothoracic Surgery) Nestor Lewandowsky, MD as Referring Physician (Thoracic Diseases) Erby Pian, MD as Referring Physician Erby Pian, MD as Referring Physician  Extended Emergency Contact Information Primary Emergency Contact: Steeber,Richard Address: [address]          [place], [place] [address] Johnnette Litter of [place] Phone: [phone] Relation: Spouse Secondary Emergency Contact: Geralyn Corwin Address: [place], [place] [address] Johnnette Litter of [place] Phone: [phone] Mobile Phone: [phone] Relation: Sister  Code Status:  Full  Goals of care: Advanced Directive information Advanced Directives 10/12/2016  Does Patient Have a Medical Advance Directive? No  Type of Advance Directive -  Does patient want to make changes to medical advance directive? -  Copy of [place] in Chart? -  Would patient like information on creating a medical advance directive? -  Pre-existing out of facility DNR order (yellow form or pink MOST form) -     Chief Complaint  Patient presents with  . Acute Visit    Follow up on Dyspnea    HPI:  Pt is a 76 y.o. female seen today for an acute visit for dyspnea. Pt having increased shortness of breath and cough- nonproductive. Pt is pale with a gray tint undertone to her skin. She appears tachypneic with some tachycardia. She says she just doesn't feel well. Pt is sitting in the common area in the wheelchair. Appears lethargic, minimal verbal interaction. She endorses some anxiety. Pt denies n/v/d/f/c/cp/ha/abd pain/dizziness. Afebrile. Pt is still on levaquin for bronchitis from the hospital. No other  complaints.    Past Medical History:  Diagnosis Date  . Anginal pain [facility])    see dr [name]  "Spams"  . Anxiety   . Arthritis   . Cancer (HCC)    side of head- skin cancer, squamous cell ca on left foot.  . Chronic airway obstruction (HCC)    chronic airway obstruction not elsewhere classified, unspecified  . Chronic obstructive asthma ([place])    unspecified  . Complication of anesthesia   . Constipation   . COPD (chronic obstructive pulmonary disease) ([place])   . COPD with emphysema ([place]) 01/05/2013  . COPD, severe 07/29/98  . Coronary artery disease 01/22/99   Non Q-wave MI, stent RCA  . Coronary atherosclerosis of native coronary artery 01/22/1999   status post stent RCA,   . Depression   . Depression   . Dysrhythmia    Palpations at times. last time 10/29 ish 2014  . Esophageal dilatation    Pt needs appple sauce to take meds.  . Esophageal stricture    PT needs apple sauce to take meds  . GERD (gastroesophageal reflux disease) 08/29/98   severe-no meds now  . Head injury, acute, with loss of consciousness ([place]) 11/2012   unsure how long  . History of blood transfusion   . History of blurry vision 05/06-05/10/2009   Hospital [facility] CP R/O'D Blurry vision, smoking  . History of CT scan of head 08/02/09   w/o mild age appropr atrophy  . History of ETT 08/03/09   Myoview Normal  . History of ETT 05/1999   Cardiolite  pos ETT, neg Cardiolite  . History of ETT 09/02/01   Normal  . History of ETT 04/28/2006   Myoview normal EF 83%  . History of ETT 04/10/11   normal EF and no ischemia per Dr. Saralyn Pilar  . History of pneumothorax 01/05/2013  . Hx of cardiac catheterization 08/29/01   60% RCA 0/w 20-30% lesions  . Hx of cardiac catheterization 01/22/99   w/Stent 90% RCA lesion  . Hyperlipidemia, unspecified   . Hypertension    03/30/00  . Mental disorder   . On home oxygen therapy    as needed-has a travel tank  . Pneumothorax 2/14  . PONV (postoperative nausea  and vomiting)    ad breast remocved and see was under anesthesia a long time  . Shortness of breath   . Status post aortic coarctation stent placement   . Tobacco abuse    1/4 pack daily   Past Surgical History:  Procedure Laterality Date  . BACK SURGERY    . BREAST SURGERY     implant removal, R breast  . CARDIAC CATHETERIZATION    . CAROTID STENT Right 2000  . CATARACT EXTRACTION Bilateral    2016  . COLONOSCOPY    . ESOPHAGEAL DILATION  06/00   EGD  . LAMINECTOMY  1976   Disc Removal X 2 Lumbar  . MASTECTOMY  03/1976   Bilateral due FCBD with implants [facility])  . MASTECTOMY Left    due to fibrocystic breast disease  . OOPHORECTOMY    . TISSUE EXPANDER PLACEMENT Right 02/03/2013   Procedure: REMOVAL OF RIGHT BREAST IMPLANT AND IMPLANT MATERIAL  CAPSULECTOMY;  Surgeon: Irene Limbo, MD;  Location: [facility];  Service: Plastics;  Laterality: Right;  . TOTAL ABDOMINAL HYSTERECTOMY W/ BILATERAL SALPINGOOPHORECTOMY      Allergies  Allergen Reactions  . Amoxicillin Shortness Of Breath  . Doxycycline Nausea Only and Other (See Comments)    Dizzy Reaction:  Dizziness   . Ibuprofen   . Sertraline Hcl Nausea And Vomiting    REACTION: trembling    Allergies as of 10/09/2016      Reactions   Amoxicillin Shortness Of Breath   Doxycycline Nausea Only, Other (See Comments)   Dizzy Reaction:  Dizziness    Sertraline Hcl Nausea And Vomiting   REACTION: trembling      Medication List       Accurate as of 10/09/16 11:59 PM. Always use your most recent med list.          albuterol 108 (90 Base) MCG/ACT inhaler Commonly known as:  PROAIR HFA INHALE TWO PUFFS BY MOUTH EVERY 6 HOURS AS NEEDED   ALPRAZolam 0.5 MG tablet Commonly known as:  XANAX Take 0.5 mg by mouth 4 (four) times daily.   aspirin EC 81 MG tablet Take 81 mg by mouth daily.   budesonide-formoterol 160-4.5 MCG/ACT inhaler Commonly known as:  SYMBICORT Inhale 2 puffs into the lungs 2 (two) times daily.    clopidogrel 75 MG tablet Commonly known as:  PLAVIX TAKE 1 TABLET BY MOUTH ONCE A DAY   diltiazem 60 MG tablet Commonly known as:  CARDIZEM Take 1 tablet (60 mg total) by mouth every 8 (eight) hours.   furosemide 40 MG tablet Commonly known as:  LASIX Take 1 tablet (40 mg total) by mouth daily.   ipratropium-albuterol 0.5-2.5 (3) MG/3ML Soln Commonly known as:  DUONEB Take 3 mLs by nebulization 3 (three) times daily.   ipratropium-albuterol 0.5-2.5 (3) MG/3ML Soln Commonly known  as:  DUONEB Take 3 mLs by nebulization every 4 (four) hours as needed.   lactulose 10 GM/15ML solution Commonly known as:  CHRONULAC Take 30 g by mouth 2 (two) times daily.   magnesium hydroxide 400 MG/5ML suspension Commonly known as:  MILK OF MAGNESIA Take 30 mLs by mouth daily.   meclizine 25 MG tablet Commonly known as:  ANTIVERT Take 1 tablet (25 mg total) by mouth 3 (three) times daily as needed for dizziness.   nitroGLYCERIN 0.4 MG SL tablet Commonly known as:  NITROSTAT Place 1 tablet (0.4 mg total) under the tongue every 5 (five) minutes as needed for chest pain (max 3 doses in 15 min).   OXYGEN Inhale 2-3 L into the lungs continuous.   polyethylene glycol packet Commonly known as:  MIRALAX / GLYCOLAX Take 17 g by mouth daily.   ranolazine 500 MG 12 hr tablet Commonly known as:  RANEXA Take 1 tablet (500 mg total) by mouth 2 (two) times daily.   sennosides-docusate sodium 8.6-50 MG tablet Commonly known as:  SENOKOT-S Take 1 tablet by mouth 2 (two) times daily.   SPIRIVA HANDIHALER 18 MCG inhalation capsule Generic drug:  tiotropium INHALE ONE DOSE ONCE DAILY   venlafaxine XR 75 MG 24 hr capsule Commonly known as:  EFFEXOR-XR Take 75 mg by mouth daily at 10 pm.       Review of Systems  Constitutional: Positive for fatigue. Negative for activity change, appetite change, chills, diaphoresis and fever.  HENT: Negative for congestion, sneezing, sore throat, trouble  swallowing and voice change.   Respiratory: Positive for cough, shortness of breath and wheezing. Negative for apnea, choking and chest tightness.   Cardiovascular: Negative for chest pain, palpitations and leg swelling.  Gastrointestinal: Negative for abdominal distention, abdominal pain, constipation, diarrhea and nausea.  Genitourinary: Negative for difficulty urinating, dysuria, frequency and urgency.  Musculoskeletal: Positive for arthralgias (typical arthritis). Negative for back pain, gait problem and myalgias.  Skin: Positive for color change. Negative for pallor, rash and wound.  Neurological: Positive for weakness. Negative for dizziness, tremors, syncope, speech difficulty, light-headedness, numbness and headaches.  Psychiatric/Behavioral: Negative for agitation and behavioral problems.  All other systems reviewed and are negative.   Immunization History  Administered Date(s) Administered  . Influenza Split 01/23/2011, 12/16/2011  . Influenza Whole 12/28/2001, 01/03/2008, 12/11/2009  . Influenza,inj,Quad PF,36+ Mos 12/21/2012, 12/20/2013, 12/18/2014, 12/19/2015  . Pneumococcal Conjugate-13 08/10/2014  . Pneumococcal Polysaccharide-23 01/20/2010, 12/05/2015  . Td 04/30/2005   Pertinent  Health Maintenance Due  Topic Date Due  . MAMMOGRAM  12/04/2025 (Originally 12/31/2014)  . INFLUENZA VACCINE  10/28/2016  . COLONOSCOPY  05/22/2017  . DEXA SCAN  Completed  . PNA vac Low Risk Adult  Completed   Fall Risk  12/05/2015 01/22/2015 08/10/2014 07/07/2013  Falls in the past year? No No No Yes  Number falls in past yr: - - - 1  Injury with Fall? - - - Yes   Functional Status Survey:    Vitals:   10/09/16 1231  BP: (!) 133/49  Pulse: 80  Resp: 20  Temp: 97.8 F (36.6 C)  SpO2: 98%  Weight: 155 lb 14.4 oz (70.7 kg)  Height: '5\' 7"'  (1.702 m)   Body mass index is 24.42 kg/m. Physical Exam  Constitutional: She is oriented to person, place, and time. Vital signs are normal.  She appears well-developed and well-nourished. She is active and cooperative. She does not appear ill. No distress. Nasal cannula in place.  HENT:  Head: Normocephalic and atraumatic.  Mouth/Throat: Uvula is midline, oropharynx is clear and moist and mucous membranes are normal. Mucous membranes are not pale, not dry and not cyanotic.  Eyes: Pupils are equal, round, and reactive to light. Conjunctivae, EOM and lids are normal.  Neck: Trachea normal, normal range of motion and full passive range of motion without pain. Neck supple. No JVD present. No tracheal deviation, no edema and no erythema present. No thyromegaly present.  Cardiovascular: Regular rhythm, normal heart sounds and intact distal pulses.  Tachycardia present.  Exam reveals no gallop, no distant heart sounds and no friction rub.   No murmur heard. Pulses:      Dorsalis pedis pulses are 1+ on the right side, and 1+ on the left side.  Pulmonary/Chest: Effort normal. No accessory muscle usage. Tachypnea noted. No respiratory distress. She has decreased breath sounds in the right lower field and the left lower field. She has wheezes in the right upper field and the left upper field. She has rhonchi in the right upper field and the left upper field. She has no rales. She exhibits no tenderness.  Abdominal: Normal appearance and bowel sounds are normal. She exhibits no distension and no ascites. There is no tenderness.  Musculoskeletal: Normal range of motion. She exhibits no edema or tenderness.  Expected osteoarthritis, stiffness  Neurological: She is alert and oriented to person, place, and time. She has normal strength.  Skin: Skin is warm, dry and intact. No rash noted. She is not diaphoretic. No cyanosis or erythema. There is pallor (gray tinting). Nails show no clubbing.  Psychiatric: She has a normal mood and affect. Her speech is normal and behavior is normal. Judgment and thought content normal. Cognition and memory are normal.    Nursing note and vitals reviewed.   Labs reviewed:  Recent Labs  05/14/16 1754  09/25/16 1437 09/26/16 0301 10/05/16 0704 10/10/16 0053  NA 140  < > 137 136 137 138  K 3.0*  < > 3.5 4.2 4.7 4.7  CL 99*  < > 98* 101 95* 97*  CO2 34*  < > 29 28 37* 34*  GLUCOSE 114*  < > 98 148* 121* 119*  BUN 19  < > 18 16 25* 22*  CREATININE 0.84  < > 1.12* 0.74 0.97 1.20*  CALCIUM 9.3  < > 10.2 9.4 8.8* 9.7  MG 1.8  --  2.0  --   --   --   PHOS 2.8  --  3.0  --   --   --   < > = values in this interval not displayed.  Recent Labs  05/14/16 1754 09/25/16 1437 10/10/16 0053  AST 26 32 21  ALT '20 22 17  ' ALKPHOS 72 85 61  BILITOT 0.6 0.6 0.4  PROT 6.4* 7.9 6.2*  ALBUMIN 3.4* 4.0 3.1*    Recent Labs  05/14/16 1754  09/25/16 1437  09/27/16 0450 10/05/16 0704 10/10/16 0053  WBC 10.5  < > 13.0*  < > 10.8 11.8* 12.0*  NEUTROABS 7.2*  --  9.2*  --   --   --  9.1*  HGB 13.3  < > 14.8  < > 13.3 13.5 12.9  HCT 37.8  < > 45.8  < > 38.5 41.1 39.0  MCV 87.8  < > 89.1  < > 87.4 89.3 87.6  PLT 227  < > 280  < > 248 221 207  < > = values in this interval not displayed. Lab  Results  Component Value Date   TSH 0.223 (L) 05/27/2013   Lab Results  Component Value Date   HGBA1C 5.2 04/21/2011   Lab Results  Component Value Date   CHOL 125 10/10/2016   HDL 52 10/10/2016   LDLCALC 55 10/10/2016   TRIG 89 10/10/2016   CHOLHDL 2.4 10/10/2016    Significant Diagnostic Results in last 30 days:  Dg Chest 1 View  Result Date: 09/28/2016 CLINICAL DATA:  Chest pain and bradycardia EXAM: CHEST 1 VIEW COMPARISON:  September 25, 2016. FINDINGS: There is scarring in the right base. There is no edema or consolidation. Heart size and pulmonary vascularity are normal. No adenopathy. There is a breast implant on the left. There is aortic atherosclerosis. No bone lesions. No pneumothorax. IMPRESSION: Scarring right base. No edema or consolidation. Stable cardiac silhouette. There is aortic atherosclerosis.  Aortic Atherosclerosis (ICD10-I70.0). Electronically Signed   By: Lowella Grip III M.D.   On: 09/28/2016 15:02   Dg Chest 2 View  Result Date: 09/25/2016 CLINICAL DATA:  Chest heaviness and shortness of Breath EXAM: CHEST  2 VIEW COMPARISON:  06/15/2016 FINDINGS: Cardiac shadow is stable. Left breast implant is noted. Aortic calcifications are again seen. Diffuse interstitial changes are seen without focal infiltrate or sizable effusion. No acute bony abnormality is noted. IMPRESSION: Chronic changes without acute abnormality. Electronically Signed   By: Inez Catalina M.D.   On: 09/25/2016 15:14   Dg Abd 1 View  Result Date: 10/01/2016 CLINICAL DATA:  76 year old female with a history of constipation EXAM: ABDOMEN - 1 VIEW COMPARISON:  CT 10/03/2015 FINDINGS: Gas within stomach, small bowel, colon. No abnormally distended small bowel or colon. Gas extends to the rectum. Formed stool within the right colon and proximal transverse colon. No displaced fracture.  Degenerative changes of the spine. Rounded radiopaque density in the left abdomen measures approximately 14- 15 mm, compatible with known left collecting system nephrolithiasis. IMPRESSION: Moderate stool burden without evidence of obstruction. Known left-sided nephrolithiasis Electronically Signed   By: Corrie Mckusick D.O.   On: 10/01/2016 13:32   Ct Head Wo Contrast  Result Date: 09/25/2016 CLINICAL DATA:  Chest heaviness beginning yesterday.  Dizziness. EXAM: CT HEAD WITHOUT CONTRAST TECHNIQUE: Contiguous axial images were obtained from the base of the skull through the vertex without intravenous contrast. COMPARISON:  05/14/2016 FINDINGS: Brain: Age related atrophy. Mild to moderate chronic small-vessel ischemic change of the deep white matter. No sign of acute infarction, mass lesion, hemorrhage, hydrocephalus or extra-axial collection. Vascular: There is atherosclerotic calcification of the major vessels at the base of the brain. Skull:  Negative Sinuses/Orbits: Clear/normal. Other: None significant IMPRESSION: Atrophy and chronic small-vessel ischemic changes. No acute finding by CT. No finding to explain dizziness. Electronically Signed   By: Nelson Chimes M.D.   On: 09/25/2016 14:55    Assessment/Plan 1. Chronic obstructive pulmonary disease, unspecified COPD type (HCC)  Schedule Duonebs TID  Monitor O2 sats  2. Anxiety and depression   DC Venlafaxin 37.5 mg  Venlafaxin ER 75 mg po Q Day  Continue Alprazolam 0.5 mg po QID per home regimen  3. Hyperlipidemia, unspecified hyperlipidemia type  Stable  DC Atorvastatin  Family/ staff Communication:   Total Time:  Documentation:  Face to Face:  Family/Phone:   Labs/tests ordered: cbc, met c, lipid panel  Medication list reviewed and assessed for continued appropriateness.  Vikki Ports, NP-C Geriatrics [facility] [phone] [place] [address] Cell  Phone (Mon-Fri 8am-5pm):  [phone] On Call:  [phone] & follow prompts after 5pm & weekends Office Phone:  [phone] Office Fax:  [phone]

## 2016-10-13 ENCOUNTER — Non-Acute Institutional Stay (SKILLED_NURSING_FACILITY): Payer: Medicare Other | Admitting: Gerontology

## 2016-10-13 ENCOUNTER — Ambulatory Visit: Payer: Medicare Other

## 2016-10-13 DIAGNOSIS — J449 Chronic obstructive pulmonary disease, unspecified: Secondary | ICD-10-CM | POA: Diagnosis not present

## 2016-10-15 ENCOUNTER — Encounter: Payer: Self-pay | Admitting: Gerontology

## 2016-10-15 NOTE — Progress Notes (Signed)
Location:   [facility] Room Number: [place] of Service:  SNF [phone]) Provider:  Toni Arthurs, NP-C  Tonia Ghent, MD  Patient Care Team: Tonia Ghent, MD as PCP - General (Family Medicine) Grace Isaac, MD as Consulting Physician (Cardiothoracic Surgery) Nestor Lewandowsky, MD as Referring Physician (Thoracic Diseases) Erby Pian, MD as Referring Physician Erby Pian, MD as Referring Physician  Extended Emergency Contact Information Primary Emergency Contact: Speedy,Richard Address: [address]          [place], [place] [address] Johnnette Litter of [place] Phone: [phone] Relation: Spouse Secondary Emergency Contact: Geralyn Corwin Address: [place], [place] [address] Johnnette Litter of [place] Phone: [phone] Mobile Phone: [phone] Relation: Sister  Code Status:  FULL Goals of care: Advanced Directive information Advanced Directives 10/13/2016  Does Patient Have a Medical Advance Directive? No  Type of Advance Directive -  Does patient want to make changes to medical advance directive? -  Copy of [place] in Chart? -  Would patient like information on creating a medical advance directive? -  Pre-existing out of facility DNR order (yellow form or pink MOST form) -     Chief Complaint  Patient presents with  . Acute Visit    Follow up on dyspnea    HPI:  Pt is a 76 y.o. female seen today for an acute visit for dyspnea and congestion. Pt was seen by the Palliative Care NP and was noted to be short of breath with rhonchi. Pt continues to c/o non-productive cough. Of note, her coloring and mentation is better than it was at the last visit. Pt denies chest pains, does endorse some dyspnea. O2 2L  is in place. Pt denies n/v/d/f/c/cp/ha/abd pain/dizziness. VSS. No other complaints.    Past Medical History:  Diagnosis Date  . Anginal pain [facility])    see dr [name]   "Spams"  . Anxiety   . Arthritis   . Cancer (HCC)    side of head- skin cancer, squamous cell ca on left foot.  . Chronic airway obstruction (HCC)    chronic airway obstruction not elsewhere classified, unspecified  . Chronic obstructive asthma ([place])    unspecified  . Complication of anesthesia   . Constipation   . COPD (chronic obstructive pulmonary disease) ([place])   . COPD with emphysema ([place]) 01/05/2013  . COPD, severe 07/29/98  . Coronary artery disease 01/22/99   Non Q-wave MI, stent RCA  . Coronary atherosclerosis of native coronary artery 01/22/1999   status post stent RCA,   . Depression   . Depression   . Dysrhythmia    Palpations at times. last time 10/29 ish 2014  . Esophageal dilatation    Pt needs appple sauce to take meds.  . Esophageal stricture    PT needs apple sauce to take meds  . GERD (gastroesophageal reflux disease) 08/29/98   severe-no meds now  . Head injury, acute, with loss of consciousness ([place]) 11/2012   unsure how long  . History of blood transfusion   . History of blurry vision 05/06-05/10/2009   Hospital [facility] CP R/O'D Blurry vision, smoking  . History of CT scan of head 08/02/09   w/o mild age appropr atrophy  . History of ETT 08/03/09   Myoview Normal  . History of ETT 05/1999   Cardiolite pos ETT, neg Cardiolite  . History of  ETT 09/02/01   Normal  . History of ETT 04/28/2006   Myoview normal EF 83%  . History of ETT 04/10/11   normal EF and no ischemia per Dr. Saralyn Pilar  . History of pneumothorax 01/05/2013  . Hx of cardiac catheterization 08/29/01   60% RCA 0/w 20-30% lesions  . Hx of cardiac catheterization 01/22/99   w/Stent 90% RCA lesion  . Hyperlipidemia, unspecified   . Hypertension    03/30/00  . Mental disorder   . On home oxygen therapy    as needed-has a travel tank  . Pneumothorax 2/14  . PONV (postoperative nausea and vomiting)    ad breast remocved and see was under anesthesia a long time  . Shortness of breath   .  Status post aortic coarctation stent placement   . Tobacco abuse    1/4 pack daily   Past Surgical History:  Procedure Laterality Date  . BACK SURGERY    . BREAST SURGERY     implant removal, R breast  . CARDIAC CATHETERIZATION    . CAROTID STENT Right 2000  . CATARACT EXTRACTION Bilateral    2016  . COLONOSCOPY    . ESOPHAGEAL DILATION  06/00   EGD  . LAMINECTOMY  1976   Disc Removal X 2 Lumbar  . MASTECTOMY  03/1976   Bilateral due FCBD with implants [facility])  . MASTECTOMY Left    due to fibrocystic breast disease  . OOPHORECTOMY    . TISSUE EXPANDER PLACEMENT Right 02/03/2013   Procedure: REMOVAL OF RIGHT BREAST IMPLANT AND IMPLANT MATERIAL  CAPSULECTOMY;  Surgeon: Irene Limbo, MD;  Location: [facility];  Service: Plastics;  Laterality: Right;  . TOTAL ABDOMINAL HYSTERECTOMY W/ BILATERAL SALPINGOOPHORECTOMY      Allergies  Allergen Reactions  . Amoxicillin Shortness Of Breath  . Doxycycline Nausea Only and Other (See Comments)    Dizzy Reaction:  Dizziness   . Ibuprofen   . Sertraline Hcl Nausea And Vomiting    REACTION: trembling    Allergies as of 10/13/2016      Reactions   Amoxicillin Shortness Of Breath   Doxycycline Nausea Only, Other (See Comments)   Dizzy Reaction:  Dizziness    Ibuprofen    Sertraline Hcl Nausea And Vomiting   REACTION: trembling      Medication List       Accurate as of 10/13/16 11:59 PM. Always use your most recent med list.          albuterol 108 (90 Base) MCG/ACT inhaler Commonly known as:  PROAIR HFA INHALE TWO PUFFS BY MOUTH EVERY 6 HOURS AS NEEDED   ALPRAZolam 0.5 MG tablet Commonly known as:  XANAX Take 0.5 mg by mouth 3 (three) times daily.   aspirin EC 81 MG tablet Take 81 mg by mouth daily.   budesonide-formoterol 160-4.5 MCG/ACT inhaler Commonly known as:  SYMBICORT Inhale 2 puffs into the lungs 2 (two) times daily.   clopidogrel 75 MG tablet Commonly known as:  PLAVIX TAKE 1 TABLET BY MOUTH ONCE A DAY    diltiazem 60 MG tablet Commonly known as:  CARDIZEM Take 1 tablet (60 mg total) by mouth every 8 (eight) hours.   furosemide 40 MG tablet Commonly known as:  LASIX Take 1 tablet (40 mg total) by mouth daily.   ipratropium-albuterol 0.5-2.5 (3) MG/3ML Soln Commonly known as:  DUONEB Take 3 mLs by nebulization 3 (three) times daily.   ipratropium-albuterol 0.5-2.5 (3) MG/3ML Soln Commonly known as:  DUONEB Take  3 mLs by nebulization every 4 (four) hours as needed.   lactulose 10 GM/15ML solution Commonly known as:  CHRONULAC Take 30 g by mouth 2 (two) times daily.   magnesium hydroxide 400 MG/5ML suspension Commonly known as:  MILK OF MAGNESIA Take 30 mLs by mouth daily.   meclizine 25 MG tablet Commonly known as:  ANTIVERT Take 1 tablet (25 mg total) by mouth 3 (three) times daily as needed for dizziness.   nitroGLYCERIN 0.4 MG SL tablet Commonly known as:  NITROSTAT Place 1 tablet (0.4 mg total) under the tongue every 5 (five) minutes as needed for chest pain (max 3 doses in 15 min).   OXYGEN Inhale 2-3 L into the lungs continuous.   polyethylene glycol packet Commonly known as:  MIRALAX / GLYCOLAX Take 17 g by mouth daily as needed.   ranolazine 500 MG 12 hr tablet Commonly known as:  RANEXA Take 1 tablet (500 mg total) by mouth 2 (two) times daily.   sennosides-docusate sodium 8.6-50 MG tablet Commonly known as:  SENOKOT-S Take 1 tablet by mouth 2 (two) times daily.   SPIRIVA HANDIHALER 18 MCG inhalation capsule Generic drug:  tiotropium INHALE ONE DOSE ONCE DAILY   venlafaxine XR 75 MG 24 hr capsule Commonly known as:  EFFEXOR-XR Take 75 mg by mouth daily at 10 pm.       Review of Systems  Constitutional: Positive for fatigue. Negative for activity change, appetite change, chills, diaphoresis and fever.  HENT: Negative for congestion, sneezing, sore throat, trouble swallowing and voice change.   Respiratory: Positive for cough and shortness of breath.  Negative for apnea, choking, chest tightness and wheezing.   Cardiovascular: Negative for chest pain, palpitations and leg swelling.  Gastrointestinal: Negative for abdominal distention, abdominal pain, constipation, diarrhea and nausea.  Genitourinary: Negative for difficulty urinating, dysuria, frequency and urgency.  Musculoskeletal: Positive for arthralgias (typical arthritis). Negative for back pain, gait problem and myalgias.  Skin: Positive for color change. Negative for pallor, rash and wound.  Neurological: Positive for weakness. Negative for dizziness, tremors, syncope, speech difficulty, light-headedness, numbness and headaches.  Psychiatric/Behavioral: Negative for agitation and behavioral problems.  All other systems reviewed and are negative.   Immunization History  Administered Date(s) Administered  . Influenza Split 01/23/2011, 12/16/2011  . Influenza Whole 12/28/2001, 01/03/2008, 12/11/2009  . Influenza,inj,Quad PF,36+ Mos 12/21/2012, 12/20/2013, 12/18/2014, 12/19/2015  . Pneumococcal Conjugate-13 08/10/2014  . Pneumococcal Polysaccharide-23 01/20/2010, 12/05/2015  . Td 04/30/2005   Pertinent  Health Maintenance Due  Topic Date Due  . MAMMOGRAM  12/04/2025 (Originally 12/31/2014)  . INFLUENZA VACCINE  10/28/2016  . COLONOSCOPY  05/22/2017  . DEXA SCAN  Completed  . PNA vac Low Risk Adult  Completed   Fall Risk  12/05/2015 01/22/2015 08/10/2014 07/07/2013  Falls in the past year? No No No Yes  Number falls in past yr: - - - 1  Injury with Fall? - - - Yes   Functional Status Survey:    Vitals:   10/13/16 0851  BP: 128/66  Pulse: 87  Resp: 17  Temp: 98 F (36.7 C)  SpO2: 98%  Weight: 153 lb 11.2 oz (69.7 kg)  Height: 5\' 7"  (1.702 m)   Body mass index is 24.07 kg/m. Physical Exam  Constitutional: She is oriented to person, place, and time. Vital signs are normal. She appears well-developed and well-nourished. She is active and cooperative. She does not appear  ill. No distress. Nasal cannula in place.  HENT:  Head: Normocephalic and  atraumatic.  Mouth/Throat: Uvula is midline, oropharynx is clear and moist and mucous membranes are normal. Mucous membranes are not pale, not dry and not cyanotic.  Eyes: Pupils are equal, round, and reactive to light. Conjunctivae, EOM and lids are normal.  Neck: Trachea normal, normal range of motion and full passive range of motion without pain. Neck supple. No JVD present. No tracheal deviation, no edema and no erythema present. No thyromegaly present.  Cardiovascular: Regular rhythm, normal heart sounds and intact distal pulses.  Tachycardia present.  Exam reveals no gallop, no distant heart sounds and no friction rub.   No murmur heard. Pulses:      Dorsalis pedis pulses are 1+ on the right side, and 1+ on the left side.  Pulmonary/Chest: Effort normal. No accessory muscle usage. No tachypnea. No respiratory distress. She has decreased breath sounds in the right lower field and the left lower field. She has no wheezes. She has rhonchi in the right upper field and the left upper field. She has rales in the right upper field and the left upper field. She exhibits no tenderness.  Abdominal: Normal appearance and bowel sounds are normal. She exhibits no distension and no ascites. There is no tenderness.  Musculoskeletal: Normal range of motion. She exhibits no edema or tenderness.  Expected osteoarthritis, stiffness  Neurological: She is alert and oriented to person, place, and time. She has normal strength.  Skin: Skin is warm, dry and intact. No rash noted. She is not diaphoretic. No cyanosis or erythema. There is pallor (gray tinting). Nails show no clubbing.  Psychiatric: She has a normal mood and affect. Her speech is normal and behavior is normal. Judgment and thought content normal. Cognition and memory are normal.  Nursing note and vitals reviewed.   Labs reviewed:  Recent Labs  05/14/16 1754  09/25/16 1437  09/26/16 0301 10/05/16 0704 10/10/16 0053  NA 140  < > 137 136 137 138  K 3.0*  < > 3.5 4.2 4.7 4.7  CL 99*  < > 98* 101 95* 97*  CO2 34*  < > 29 28 37* 34*  GLUCOSE 114*  < > 98 148* 121* 119*  BUN 19  < > 18 16 25* 22*  CREATININE 0.84  < > 1.12* 0.74 0.97 1.20*  CALCIUM 9.3  < > 10.2 9.4 8.8* 9.7  MG 1.8  --  2.0  --   --   --   PHOS 2.8  --  3.0  --   --   --   < > = values in this interval not displayed.  Recent Labs  05/14/16 1754 09/25/16 1437 10/10/16 0053  AST 26 32 21  ALT 20 22 17   ALKPHOS 72 85 61  BILITOT 0.6 0.6 0.4  PROT 6.4* 7.9 6.2*  ALBUMIN 3.4* 4.0 3.1*    Recent Labs  05/14/16 1754  09/25/16 1437  09/27/16 0450 10/05/16 0704 10/10/16 0053  WBC 10.5  < > 13.0*  < > 10.8 11.8* 12.0*  NEUTROABS 7.2*  --  9.2*  --   --   --  9.1*  HGB 13.3  < > 14.8  < > 13.3 13.5 12.9  HCT 37.8  < > 45.8  < > 38.5 41.1 39.0  MCV 87.8  < > 89.1  < > 87.4 89.3 87.6  PLT 227  < > 280  < > 248 221 207  < > = values in this interval not displayed. Lab Results  Component Value  Date   TSH 0.223 (L) 05/27/2013   Lab Results  Component Value Date   HGBA1C 5.2 04/21/2011   Lab Results  Component Value Date   CHOL 125 10/10/2016   HDL 52 10/10/2016   LDLCALC 55 10/10/2016   TRIG 89 10/10/2016   CHOLHDL 2.4 10/10/2016    Significant Diagnostic Results in last 30 days:  Dg Chest 1 View  Result Date: 09/28/2016 CLINICAL DATA:  Chest pain and bradycardia EXAM: CHEST 1 VIEW COMPARISON:  September 25, 2016. FINDINGS: There is scarring in the right base. There is no edema or consolidation. Heart size and pulmonary vascularity are normal. No adenopathy. There is a breast implant on the left. There is aortic atherosclerosis. No bone lesions. No pneumothorax. IMPRESSION: Scarring right base. No edema or consolidation. Stable cardiac silhouette. There is aortic atherosclerosis. Aortic Atherosclerosis (ICD10-I70.0). Electronically Signed   By: Lowella Grip III M.D.   On:  09/28/2016 15:02   Dg Chest 2 View  Result Date: 09/25/2016 CLINICAL DATA:  Chest heaviness and shortness of Breath EXAM: CHEST  2 VIEW COMPARISON:  06/15/2016 FINDINGS: Cardiac shadow is stable. Left breast implant is noted. Aortic calcifications are again seen. Diffuse interstitial changes are seen without focal infiltrate or sizable effusion. No acute bony abnormality is noted. IMPRESSION: Chronic changes without acute abnormality. Electronically Signed   By: Inez Catalina M.D.   On: 09/25/2016 15:14   Dg Abd 1 View  Result Date: 10/01/2016 CLINICAL DATA:  76 year old female with a history of constipation EXAM: ABDOMEN - 1 VIEW COMPARISON:  CT 10/03/2015 FINDINGS: Gas within stomach, small bowel, colon. No abnormally distended small bowel or colon. Gas extends to the rectum. Formed stool within the right colon and proximal transverse colon. No displaced fracture.  Degenerative changes of the spine. Rounded radiopaque density in the left abdomen measures approximately 14- 15 mm, compatible with known left collecting system nephrolithiasis. IMPRESSION: Moderate stool burden without evidence of obstruction. Known left-sided nephrolithiasis Electronically Signed   By: Corrie Mckusick D.O.   On: 10/01/2016 13:32   Ct Head Wo Contrast  Result Date: 09/25/2016 CLINICAL DATA:  Chest heaviness beginning yesterday.  Dizziness. EXAM: CT HEAD WITHOUT CONTRAST TECHNIQUE: Contiguous axial images were obtained from the base of the skull through the vertex without intravenous contrast. COMPARISON:  05/14/2016 FINDINGS: Brain: Age related atrophy. Mild to moderate chronic small-vessel ischemic change of the deep white matter. No sign of acute infarction, mass lesion, hemorrhage, hydrocephalus or extra-axial collection. Vascular: There is atherosclerotic calcification of the major vessels at the base of the brain. Skull: Negative Sinuses/Orbits: Clear/normal. Other: None significant IMPRESSION: Atrophy and chronic  small-vessel ischemic changes. No acute finding by CT. No finding to explain dizziness. Electronically Signed   By: Nelson Chimes M.D.   On: 09/25/2016 14:55    Assessment/Plan 1. Chronic obstructive pulmonary disease, unspecified COPD type ([place])  Continue TID scheduled Duonebs  Given Lasix 40 mg IM X 1 now  Monitor O2 sats  Family/ staff Communication:   Total Time:  Documentation:  Face to Face:  Family/Phone:   Labs/tests ordered:    Medication list reviewed and assessed for continued appropriateness.  Vikki Ports, NP-C Geriatrics [facility] [phone] [place], [place] [address] Cell Phone (Mon-Fri 8am-5pm):  [phone] On Call:  [phone] & follow prompts after 5pm & weekends Office Phone:  [phone] Office Fax:  [phone]

## 2016-10-16 DIAGNOSIS — I10 Essential (primary) hypertension: Secondary | ICD-10-CM | POA: Diagnosis not present

## 2016-10-16 DIAGNOSIS — Z9889 Other specified postprocedural states: Secondary | ICD-10-CM | POA: Diagnosis not present

## 2016-10-16 DIAGNOSIS — I25118 Atherosclerotic heart disease of native coronary artery with other forms of angina pectoris: Secondary | ICD-10-CM | POA: Diagnosis not present

## 2016-10-16 DIAGNOSIS — R002 Palpitations: Secondary | ICD-10-CM | POA: Diagnosis not present

## 2016-10-16 DIAGNOSIS — I493 Ventricular premature depolarization: Secondary | ICD-10-CM | POA: Diagnosis not present

## 2016-10-20 ENCOUNTER — Non-Acute Institutional Stay (SKILLED_NURSING_FACILITY): Payer: Medicare Other | Admitting: Gerontology

## 2016-10-20 DIAGNOSIS — F329 Major depressive disorder, single episode, unspecified: Secondary | ICD-10-CM | POA: Diagnosis not present

## 2016-10-20 DIAGNOSIS — J449 Chronic obstructive pulmonary disease, unspecified: Secondary | ICD-10-CM

## 2016-10-20 DIAGNOSIS — F419 Anxiety disorder, unspecified: Secondary | ICD-10-CM

## 2016-10-23 ENCOUNTER — Encounter: Payer: Self-pay | Admitting: Gerontology

## 2016-10-23 NOTE — Progress Notes (Signed)
Location:   [facility] Room Number: [place] of Service:  SNF [phone]) Provider:  Toni Arthurs, NP-C  Tonia Ghent, MD  Patient Care Team: Tonia Ghent, MD as PCP - General (Family Medicine) Grace Isaac, MD as Consulting Physician (Cardiothoracic Surgery) Nestor Lewandowsky, MD as Referring Physician (Thoracic Diseases) Erby Pian, MD as Referring Physician Erby Pian, MD as Referring Physician  Extended Emergency Contact Information Primary Emergency Contact: Granda,Richard Address: [address]          [place], [place] [address] Johnnette Litter of [place] Phone: [phone] Relation: Spouse Secondary Emergency Contact: Geralyn Corwin Address: [place], [place] [address] [place] Litter of [place] Phone: [phone] Mobile Phone: [phone] Relation: Sister  Code Status:  DNR Goals of care: Advanced Directive information Advanced Directives 10/20/2016  Does Patient Have a Medical Advance Directive? Yes  Type of Advance Directive Out of facility DNR (pink MOST or yellow form)  Does patient want to make changes to medical advance directive? No - Patient declined  Copy of [place] in Chart? -  Would patient like information on creating a medical advance directive? -  Pre-existing out of facility DNR order (yellow form or pink MOST form) -     Chief Complaint  Patient presents with  . Acute Visit    Follow up on dyspnea    HPI:  Pt is a 76 y.o. female seen today for an acute visit for dyspnea and congestion. Pt was noted to be short of breath with rhonchi by staff. Pt continues to c/o non-productive cough. Her coloring and mentation is better than it was initially, but continues to be pale with gray tinting. Pt denies chest pains, does endorse some dyspnea. O2 2L [place] is in place. Pt denies n/v/d/f/c/cp/ha/abd pain/dizziness. VSS. No other complaints.      Past Medical History:    Diagnosis Date  . Anginal pain [facility])    see dr [name]  "Spams"  . Anxiety   . Arthritis   . Cancer (HCC)    side of head- skin cancer, squamous cell ca on left foot.  . Chronic airway obstruction (HCC)    chronic airway obstruction not elsewhere classified, unspecified  . Chronic obstructive asthma ([place])    unspecified  . Complication of anesthesia   . Constipation   . COPD (chronic obstructive pulmonary disease) ([place])   . COPD with emphysema ([place]) 01/05/2013  . COPD, severe 07/29/98  . Coronary artery disease 01/22/99   Non Q-wave MI, stent RCA  . Coronary atherosclerosis of native coronary artery 01/22/1999   status post stent RCA,   . Depression   . Dysrhythmia    Palpations at times. last time 10/29 ish 2014  . Esophageal dilatation    Pt needs appple sauce to take meds.  . Esophageal stricture    PT needs apple sauce to take meds  . GERD (gastroesophageal reflux disease) 08/29/98   severe-no meds now  . Head injury, acute, with loss of consciousness ([place]) 11/2012   unsure how long  . History of blood transfusion   . History of blurry vision 05/06-05/10/2009   Hospital [facility] CP R/O'D Blurry vision, smoking  . History of CT scan of head 08/02/09   w/o mild age appropr atrophy  . History of ETT 08/03/09   Myoview Normal  . History of ETT 05/1999   Cardiolite pos  ETT, neg Cardiolite  . History of ETT 09/02/01   Normal  . History of ETT 04/28/2006   Myoview normal EF 83%  . History of ETT 04/10/11   normal EF and no ischemia per Dr. Saralyn Pilar  . History of pneumothorax 01/05/2013  . Hx of cardiac catheterization 08/29/01   60% RCA 0/w 20-30% lesions  . Hx of cardiac catheterization 01/22/99   w/Stent 90% RCA lesion  . Hyperlipidemia, unspecified   . Hypertension    03/30/00  . Mental disorder   . On home oxygen therapy    as needed-has a travel tank  . Pneumothorax 2/14  . PONV (postoperative nausea and vomiting)    ad breast remocved and see was under  anesthesia a long time  . Shortness of breath   . Status post aortic coarctation stent placement   . Tobacco abuse    1/4 pack daily   Past Surgical History:  Procedure Laterality Date  . BACK SURGERY    . BREAST SURGERY     implant removal, R breast  . CARDIAC CATHETERIZATION    . CAROTID STENT Right 2000  . CATARACT EXTRACTION Bilateral    2016  . COLONOSCOPY    . ESOPHAGEAL DILATION  06/00   EGD  . LAMINECTOMY  1976   Disc Removal X 2 Lumbar  . MASTECTOMY  03/1976   Bilateral due FCBD with implants [facility])  . MASTECTOMY Left    due to fibrocystic breast disease  . OOPHORECTOMY    . TISSUE EXPANDER PLACEMENT Right 02/03/2013   Procedure: REMOVAL OF RIGHT BREAST IMPLANT AND IMPLANT MATERIAL  CAPSULECTOMY;  Surgeon: Irene Limbo, MD;  Location: [place];  Service: Plastics;  Laterality: Right;  . TOTAL ABDOMINAL HYSTERECTOMY W/ BILATERAL SALPINGOOPHORECTOMY      Allergies  Allergen Reactions  . Amoxicillin Shortness Of Breath  . Doxycycline Nausea Only and Other (See Comments)    Dizzy Reaction:  Dizziness   . Ibuprofen   . Sertraline Hcl Nausea And Vomiting    REACTION: trembling    Allergies as of 10/20/2016      Reactions   Amoxicillin Shortness Of Breath   Doxycycline Nausea Only, Other (See Comments)   Dizzy Reaction:  Dizziness    Ibuprofen    Sertraline Hcl Nausea And Vomiting   REACTION: trembling      Medication List       Accurate as of 10/20/16 11:59 PM. Always use your most recent med list.          albuterol 108 (90 Base) MCG/ACT inhaler Commonly known as:  PROAIR HFA INHALE TWO PUFFS BY MOUTH EVERY 6 HOURS AS NEEDED   ALPRAZolam 0.5 MG tablet Commonly known as:  XANAX Take 0.5 mg by mouth 3 (three) times daily.   aspirin EC 81 MG tablet Take 81 mg by mouth daily.   budesonide-formoterol 160-4.5 MCG/ACT inhaler Commonly known as:  SYMBICORT Inhale 2 puffs into the lungs 2 (two) times daily.   clopidogrel 75 MG tablet Commonly  known as:  PLAVIX TAKE 1 TABLET BY MOUTH ONCE A DAY   diltiazem 60 MG tablet Commonly known as:  CARDIZEM Take 1 tablet (60 mg total) by mouth every 8 (eight) hours.   furosemide 40 MG tablet Commonly known as:  LASIX Take 1 tablet (40 mg total) by mouth daily.   ipratropium-albuterol 0.5-2.5 (3) MG/3ML Soln Commonly known as:  DUONEB Take 3 mLs by nebulization 3 (three) times daily.   ipratropium-albuterol 0.5-2.5 (3) MG/3ML  Soln Commonly known as:  DUONEB Take 3 mLs by nebulization every 4 (four) hours as needed.   lactulose 10 GM/15ML solution Commonly known as:  CHRONULAC Take 30 g by mouth 2 (two) times daily.   magnesium hydroxide 400 MG/5ML suspension Commonly known as:  MILK OF MAGNESIA Take 30 mLs by mouth daily.   meclizine 25 MG tablet Commonly known as:  ANTIVERT Take 1 tablet (25 mg total) by mouth 3 (three) times daily as needed for dizziness.   metoprolol succinate 25 MG 24 hr tablet Commonly known as:  TOPROL-XL Take 25 mg by mouth daily.   nitroGLYCERIN 0.4 MG SL tablet Commonly known as:  NITROSTAT Place 1 tablet (0.4 mg total) under the tongue every 5 (five) minutes as needed for chest pain (max 3 doses in 15 min).   OXYGEN Inhale 2-3 L into the lungs continuous.   polyethylene glycol packet Commonly known as:  MIRALAX / GLYCOLAX Take 17 g by mouth daily as needed.   ranolazine 500 MG 12 hr tablet Commonly known as:  RANEXA Take 1 tablet (500 mg total) by mouth 2 (two) times daily.   sennosides-docusate sodium 8.6-50 MG tablet Commonly known as:  SENOKOT-S Take 1 tablet by mouth 2 (two) times daily.   SPIRIVA HANDIHALER 18 MCG inhalation capsule Generic drug:  tiotropium INHALE ONE DOSE ONCE DAILY   venlafaxine XR 75 MG 24 hr capsule Commonly known as:  EFFEXOR-XR Take 75 mg by mouth daily at 10 pm.       Review of Systems  Constitutional: Positive for fatigue. Negative for activity change, appetite change, chills, diaphoresis and  fever.  HENT: Negative for congestion, sneezing, sore throat, trouble swallowing and voice change.   Respiratory: Positive for cough and shortness of breath. Negative for apnea, choking, chest tightness and wheezing.   Cardiovascular: Negative for chest pain, palpitations and leg swelling.  Gastrointestinal: Negative for abdominal distention, abdominal pain, constipation, diarrhea and nausea.  Genitourinary: Negative for difficulty urinating, dysuria, frequency and urgency.  Musculoskeletal: Positive for arthralgias (typical arthritis). Negative for back pain, gait problem and myalgias.  Skin: Positive for color change. Negative for pallor, rash and wound.  Neurological: Positive for weakness. Negative for dizziness, tremors, syncope, speech difficulty, light-headedness, numbness and headaches.  Psychiatric/Behavioral: Negative for agitation and behavioral problems.  All other systems reviewed and are negative.   Immunization History  Administered Date(s) Administered  . Influenza Split 01/23/2011, 12/16/2011  . Influenza Whole 12/28/2001, 01/03/2008, 12/11/2009  . Influenza,inj,Quad PF,36+ Mos 12/21/2012, 12/20/2013, 12/18/2014, 12/19/2015  . Pneumococcal Conjugate-13 08/10/2014  . Pneumococcal Polysaccharide-23 01/20/2010, 12/05/2015  . Td 04/30/2005   Pertinent  Health Maintenance Due  Topic Date Due  . MAMMOGRAM  12/04/2025 (Originally 12/31/2014)  . INFLUENZA VACCINE  10/28/2016  . COLONOSCOPY  05/22/2017  . DEXA SCAN  Completed  . PNA vac Low Risk Adult  Completed   Fall Risk  12/05/2015 01/22/2015 08/10/2014 07/07/2013  Falls in the past year? No No No Yes  Number falls in past yr: - - - 1  Injury with Fall? - - - Yes   Functional Status Survey:    Vitals:   10/20/16 1102  BP: 121/65  Pulse: 86  Resp: 18  Temp: 97.9 F (36.6 C)  SpO2: 98%  Weight: 153 lb 3.2 oz (69.5 kg)  Height: '5\' 7"'  (1.702 m)   Body mass index is 23.99 kg/m. Physical Exam  Constitutional: She  is oriented to person, place, and time. Vital signs are normal.  She appears well-developed and well-nourished. She is active and cooperative. She does not appear ill. No distress. Nasal cannula in place.  HENT:  Head: Normocephalic and atraumatic.  Mouth/Throat: Uvula is midline, oropharynx is clear and moist and mucous membranes are normal. Mucous membranes are not pale, not dry and not cyanotic.  Eyes: Pupils are equal, round, and reactive to light. Conjunctivae, EOM and lids are normal.  Neck: Trachea normal, normal range of motion and full passive range of motion without pain. Neck supple. No JVD present. No tracheal deviation, no edema and no erythema present. No thyromegaly present.  Cardiovascular: Regular rhythm, normal heart sounds and intact distal pulses.  Tachycardia present.  Exam reveals no gallop, no distant heart sounds and no friction rub.   No murmur heard. Pulses:      Dorsalis pedis pulses are 1+ on the right side, and 1+ on the left side.  Pulmonary/Chest: Effort normal. No accessory muscle usage. No tachypnea. No respiratory distress. She has decreased breath sounds in the right lower field and the left lower field. She has no wheezes. She has rhonchi in the right upper field and the left upper field. She has rales in the right upper field and the left upper field. She exhibits no tenderness.  Abdominal: Normal appearance and bowel sounds are normal. She exhibits no distension and no ascites. There is no tenderness.  Musculoskeletal: Normal range of motion. She exhibits no edema or tenderness.  Expected osteoarthritis, stiffness  Neurological: She is alert and oriented to person, place, and time. She has normal strength.  Skin: Skin is warm, dry and intact. No rash noted. She is not diaphoretic. No cyanosis or erythema. There is pallor (gray tinting). Nails show no clubbing.  Psychiatric: She has a normal mood and affect. Her speech is normal and behavior is normal. Judgment and  thought content normal. Cognition and memory are normal.  Nursing note and vitals reviewed.   Labs reviewed:  Recent Labs  05/14/16 1754  09/25/16 1437 09/26/16 0301 10/05/16 0704 10/10/16 0053  NA 140  < > 137 136 137 138  K 3.0*  < > 3.5 4.2 4.7 4.7  CL 99*  < > 98* 101 95* 97*  CO2 34*  < > 29 28 37* 34*  GLUCOSE 114*  < > 98 148* 121* 119*  BUN 19  < > 18 16 25* 22*  CREATININE 0.84  < > 1.12* 0.74 0.97 1.20*  CALCIUM 9.3  < > 10.2 9.4 8.8* 9.7  MG 1.8  --  2.0  --   --   --   PHOS 2.8  --  3.0  --   --   --   < > = values in this interval not displayed.  Recent Labs  05/14/16 1754 09/25/16 1437 10/10/16 0053  AST 26 32 21  ALT '20 22 17  ' ALKPHOS 72 85 61  BILITOT 0.6 0.6 0.4  PROT 6.4* 7.9 6.2*  ALBUMIN 3.4* 4.0 3.1*    Recent Labs  05/14/16 1754  09/25/16 1437  09/27/16 0450 10/05/16 0704 10/10/16 0053  WBC 10.5  < > 13.0*  < > 10.8 11.8* 12.0*  NEUTROABS 7.2*  --  9.2*  --   --   --  9.1*  HGB 13.3  < > 14.8  < > 13.3 13.5 12.9  HCT 37.8  < > 45.8  < > 38.5 41.1 39.0  MCV 87.8  < > 89.1  < > 87.4 89.3 87.6  PLT 227  < > 280  < > 248 221 207  < > = values in this interval not displayed. Lab Results  Component Value Date   TSH 0.223 (L) 05/27/2013   Lab Results  Component Value Date   HGBA1C 5.2 04/21/2011   Lab Results  Component Value Date   CHOL 125 10/10/2016   HDL 52 10/10/2016   LDLCALC 55 10/10/2016   TRIG 89 10/10/2016   CHOLHDL 2.4 10/10/2016    Significant Diagnostic Results in last 30 days:  Dg Chest 1 View  Result Date: 09/28/2016 CLINICAL DATA:  Chest pain and bradycardia EXAM: CHEST 1 VIEW COMPARISON:  September 25, 2016. FINDINGS: There is scarring in the right base. There is no edema or consolidation. Heart size and pulmonary vascularity are normal. No adenopathy. There is a breast implant on the left. There is aortic atherosclerosis. No bone lesions. No pneumothorax. IMPRESSION: Scarring right base. No edema or consolidation.  Stable cardiac silhouette. There is aortic atherosclerosis. Aortic Atherosclerosis (ICD10-I70.0). Electronically Signed   By: Lowella Grip III M.D.   On: 09/28/2016 15:02   Dg Chest 2 View  Result Date: 09/25/2016 CLINICAL DATA:  Chest heaviness and shortness of Breath EXAM: CHEST  2 VIEW COMPARISON:  06/15/2016 FINDINGS: Cardiac shadow is stable. Left breast implant is noted. Aortic calcifications are again seen. Diffuse interstitial changes are seen without focal infiltrate or sizable effusion. No acute bony abnormality is noted. IMPRESSION: Chronic changes without acute abnormality. Electronically Signed   By: Inez Catalina M.D.   On: 09/25/2016 15:14   Dg Abd 1 View  Result Date: 10/01/2016 CLINICAL DATA:  76 year old female with a history of constipation EXAM: ABDOMEN - 1 VIEW COMPARISON:  CT 10/03/2015 FINDINGS: Gas within stomach, small bowel, colon. No abnormally distended small bowel or colon. Gas extends to the rectum. Formed stool within the right colon and proximal transverse colon. No displaced fracture.  Degenerative changes of the spine. Rounded radiopaque density in the left abdomen measures approximately 14- 15 mm, compatible with known left collecting system nephrolithiasis. IMPRESSION: Moderate stool burden without evidence of obstruction. Known left-sided nephrolithiasis Electronically Signed   By: Corrie Mckusick D.O.   On: 10/01/2016 13:32   Ct Head Wo Contrast  Result Date: 09/25/2016 CLINICAL DATA:  Chest heaviness beginning yesterday.  Dizziness. EXAM: CT HEAD WITHOUT CONTRAST TECHNIQUE: Contiguous axial images were obtained from the base of the skull through the vertex without intravenous contrast. COMPARISON:  05/14/2016 FINDINGS: Brain: Age related atrophy. Mild to moderate chronic small-vessel ischemic change of the deep white matter. No sign of acute infarction, mass lesion, hemorrhage, hydrocephalus or extra-axial collection. Vascular: There is atherosclerotic  calcification of the major vessels at the base of the brain. Skull: Negative Sinuses/Orbits: Clear/normal. Other: None significant IMPRESSION: Atrophy and chronic small-vessel ischemic changes. No acute finding by CT. No finding to explain dizziness. Electronically Signed   By: Nelson Chimes M.D.   On: 09/25/2016 14:55    Assessment/Plan 1. Chronic obstructive pulmonary disease, unspecified COPD type (HCC)  Continue TID scheduled Duonebs  Given Lasix 40 mg IM X 1 now  Increase daily Lasix to 60 mg po Q Day  Monitor O2 sats  2. Anxiety and depression  Improved  Alprazolam 0.5 mg po TID scheduled  Family/ staff Communication:   Total Time:  Documentation:  Face to Face:  Family/Phone:   Labs/tests ordered:  Cbc, met c on monday  Medication list reviewed and assessed for continued appropriateness.  Larene Beach H.  Payton Mccallum, NP-C [facility] [phone], [place] [address] Cell Phone (Mon-Fri 8am-5pm):  [phone] On Call:  [phone] & follow prompts after 5pm & weekends Office Phone:  [phone] Office Fax:  [phone]

## 2016-10-26 ENCOUNTER — Other Ambulatory Visit
Admission: RE | Admit: 2016-10-26 | Discharge: 2016-10-26 | Disposition: A | Discharge status: Home / Self Care | Payer: Medicare Other | Source: Ambulatory Visit | Attending: Gerontology | Admitting: Gerontology

## 2016-10-26 DIAGNOSIS — E785 Hyperlipidemia, unspecified: Secondary | ICD-10-CM | POA: Insufficient documentation

## 2016-10-26 DIAGNOSIS — J449 Chronic obstructive pulmonary disease, unspecified: Secondary | ICD-10-CM | POA: Insufficient documentation

## 2016-10-26 LAB — COMPREHENSIVE METABOLIC PANEL
ALT: 17 U/L (ref 14–54)
AST: 22 U/L (ref 15–41)
Albumin: 3.4 g/dL — ABNORMAL LOW (ref 3.5–5.0)
Alkaline Phosphatase: 65 U/L (ref 38–126)
Anion gap: 4 — ABNORMAL LOW (ref 5–15)
BUN: 17 mg/dL (ref 6–20)
CHLORIDE: 101 mmol/L (ref 101–111)
CO2: 34 mmol/L — AB (ref 22–32)
Calcium: 10 mg/dL (ref 8.9–10.3)
Creatinine, Ser: 0.94 mg/dL (ref 0.44–1.00)
GFR, EST NON AFRICAN AMERICAN: 59 mL/min — AB (ref >60)
Glucose, Bld: 99 mg/dL (ref 65–99)
POTASSIUM: 5.1 mmol/L (ref 3.5–5.1)
Sodium: 139 mmol/L (ref 135–145)
Total Bilirubin: 0.4 mg/dL (ref 0.3–1.2)
Total Protein: 7 g/dL (ref 6.5–8.1)

## 2016-10-26 LAB — CBC WITH DIFFERENTIAL/PLATELET
BASOS ABS: 0 10*3/uL (ref 0–0.1)
Basophils Relative: 1 %
EOS ABS: 0.1 10*3/uL (ref 0–0.7)
EOS PCT: 1 %
HCT: 37.6 % (ref 35.0–47.0)
Hemoglobin: 12.5 g/dL (ref 12.0–16.0)
LYMPHS PCT: 24 %
Lymphs Abs: 1.3 10*3/uL (ref 1.0–3.6)
MCH: 29.3 pg (ref 26.0–34.0)
MCHC: 33.2 g/dL (ref 32.0–36.0)
MCV: 88.4 fL (ref 80.0–100.0)
MONO ABS: 0.7 10*3/uL (ref 0.2–0.9)
Monocytes Relative: 12 %
Neutro Abs: 3.5 10*3/uL (ref 1.4–6.5)
Neutrophils Relative %: 62 %
PLATELETS: 314 10*3/uL (ref 150–440)
RBC: 4.26 MIL/uL (ref 3.80–5.20)
RDW: 14 % (ref 11.5–14.5)
WBC: 5.6 10*3/uL (ref 3.6–11.0)

## 2016-10-27 ENCOUNTER — Encounter: Payer: Self-pay | Admitting: Family Medicine

## 2016-10-27 DIAGNOSIS — Z515 Encounter for palliative care: Secondary | ICD-10-CM | POA: Insufficient documentation

## 2016-11-02 ENCOUNTER — Telehealth: Payer: Self-pay

## 2016-11-02 DIAGNOSIS — J441 Chronic obstructive pulmonary disease with (acute) exacerbation: Secondary | ICD-10-CM | POA: Diagnosis not present

## 2016-11-02 DIAGNOSIS — I10 Essential (primary) hypertension: Secondary | ICD-10-CM | POA: Diagnosis not present

## 2016-11-02 DIAGNOSIS — Z9889 Other specified postprocedural states: Secondary | ICD-10-CM | POA: Diagnosis not present

## 2016-11-02 DIAGNOSIS — I25118 Atherosclerotic heart disease of native coronary artery with other forms of angina pectoris: Secondary | ICD-10-CM | POA: Diagnosis not present

## 2016-11-02 DIAGNOSIS — I493 Ventricular premature depolarization: Secondary | ICD-10-CM | POA: Diagnosis not present

## 2016-11-02 NOTE — Telephone Encounter (Signed)
Pt wants to know how Dr Damita Dunnings knew she needed to have hospice. Pt request cb with info.

## 2016-11-02 NOTE — Telephone Encounter (Signed)
Looking back at the fax cover sheet, the NP at [facility] had recommended the patient for Palliative Care.  That is apparently how the paperwork came to [place].  This was explained to the patient.

## 2016-11-02 NOTE — Telephone Encounter (Signed)
Hospice had sent the papers to me my approval.  Every other times this has happened, it meant the patient had been evaluated by hospice, deemed appropriate for hospice, and that the patient had opted for their help/care.  When that happens I don't dispute it.  If any of this isn't the case, then let me know.   I though this was started with her approval.  Thanks.

## 2016-11-03 NOTE — Telephone Encounter (Signed)
Late entry. Called and talked to patient. It was my understanding that palliative care have been following the patient and that the patient wanted hospice care at home. That was why I signed off on the order. She doesn't want hospice care. Apologized to patient for the confusion. I've never had anything like this happen with a patient before. She understood. Orders faxed to rescind hospice care.  She thanked me for the call.

## 2016-11-13 DIAGNOSIS — G4733 Obstructive sleep apnea (adult) (pediatric): Secondary | ICD-10-CM | POA: Diagnosis not present

## 2016-11-13 DIAGNOSIS — J449 Chronic obstructive pulmonary disease, unspecified: Secondary | ICD-10-CM | POA: Diagnosis not present

## 2016-11-13 DIAGNOSIS — I509 Heart failure, unspecified: Secondary | ICD-10-CM | POA: Diagnosis not present

## 2016-11-22 DIAGNOSIS — G4733 Obstructive sleep apnea (adult) (pediatric): Secondary | ICD-10-CM | POA: Diagnosis not present

## 2016-11-22 DIAGNOSIS — J449 Chronic obstructive pulmonary disease, unspecified: Secondary | ICD-10-CM | POA: Diagnosis not present

## 2016-11-22 DIAGNOSIS — I509 Heart failure, unspecified: Secondary | ICD-10-CM | POA: Diagnosis not present

## 2016-11-26 DIAGNOSIS — L812 Freckles: Secondary | ICD-10-CM | POA: Diagnosis not present

## 2016-11-26 DIAGNOSIS — L578 Other skin changes due to chronic exposure to nonionizing radiation: Secondary | ICD-10-CM | POA: Diagnosis not present

## 2016-11-26 DIAGNOSIS — L821 Other seborrheic keratosis: Secondary | ICD-10-CM | POA: Diagnosis not present

## 2016-11-26 DIAGNOSIS — L720 Epidermal cyst: Secondary | ICD-10-CM | POA: Diagnosis not present

## 2016-11-26 DIAGNOSIS — D692 Other nonthrombocytopenic purpura: Secondary | ICD-10-CM | POA: Diagnosis not present

## 2016-12-08 ENCOUNTER — Encounter: Payer: Self-pay | Admitting: Family Medicine

## 2016-12-08 ENCOUNTER — Ambulatory Visit (INDEPENDENT_AMBULATORY_CARE_PROVIDER_SITE_OTHER): Payer: Medicare Other | Admitting: Family Medicine

## 2016-12-08 VITALS — BP 160/70 | HR 90 | Temp 98.4°F | Wt 157.8 lb

## 2016-12-08 DIAGNOSIS — E538 Deficiency of other specified B group vitamins: Secondary | ICD-10-CM

## 2016-12-08 DIAGNOSIS — R600 Localized edema: Secondary | ICD-10-CM

## 2016-12-08 DIAGNOSIS — Z23 Encounter for immunization: Secondary | ICD-10-CM

## 2016-12-08 DIAGNOSIS — J449 Chronic obstructive pulmonary disease, unspecified: Secondary | ICD-10-CM

## 2016-12-08 DIAGNOSIS — R0602 Shortness of breath: Secondary | ICD-10-CM | POA: Diagnosis not present

## 2016-12-08 LAB — BASIC METABOLIC PANEL
BUN: 16 mg/dL (ref 6–23)
CHLORIDE: 99 meq/L (ref 96–112)
CO2: 38 mEq/L — ABNORMAL HIGH (ref 19–32)
CREATININE: 0.96 mg/dL (ref 0.40–1.20)
Calcium: 10.5 mg/dL (ref 8.4–10.5)
GFR: 60.43 mL/min (ref >60.00)
Glucose, Bld: 118 mg/dL — ABNORMAL HIGH (ref 70–99)
POTASSIUM: 3.6 meq/L (ref 3.5–5.1)
Sodium: 142 mEq/L (ref 135–145)

## 2016-12-08 LAB — BRAIN NATRIURETIC PEPTIDE: Pro B Natriuretic peptide (BNP): 99 pg/mL (ref 0.0–100.0)

## 2016-12-08 LAB — VITAMIN B12: VITAMIN B 12: 500 pg/mL (ref 211–911)

## 2016-12-08 MED ORDER — DILTIAZEM HCL 60 MG PO TABS: 60.0000 mg | ORAL_TABLET | Freq: Four times a day (QID) | ORAL | Status: DC

## 2016-12-08 MED ORDER — CYANOCOBALAMIN 1000 MCG/ML IJ SOLN
1000.0000 ug | Freq: Once | INTRAMUSCULAR | Status: AC
  Administered 2016-12-08: 1000 ug via INTRAMUSCULAR

## 2016-12-08 NOTE — Progress Notes (Signed)
BP has been ~160/75 on home checks. She had episodic foot swelling, L>R, better with elevation, going on for about 4 weeks.  Edema is better in the AM, worse as the day goes on. SOB at baseline, on 2L at baseline.  No CP usually, only "once in a blue moon" with a burning near the epigastrum that isn't exertional.  She has some tenderness on the R chest wall- chronically noted- she can feel a pulling with a deep breath.  This isn't a new sx.  Taking lasix daily.  She is trying to avoid salt in her diet.    H/o B12 def.  Due for injection.  Due for f/u labs.    Flu shot done at [facility].   Meds, vitals, and allergies reviewed.   ROS: Per HPI unless specifically indicated in ROS section   GEN: nad, alert and oriented, on O2 at baseline.  HEENT: mucous membranes moist NECK: supple w/o LA CV: rrr PULM: ctab, no inc wob, R chest wall ttp (at baseline per patient report). ABD: soft, +bs EXT: no edema SKIN: no acute rash

## 2016-12-08 NOTE — Patient Instructions (Signed)
We'll contact you with your lab report. Don't change your meds for now. Take care.  Glad to see you.  

## 2016-12-09 NOTE — Assessment & Plan Note (Signed)
She is O2 dependent and severe COPD at baseline. She does not have exertional chest pain. She does have a burning occasionally noted near the epigastrium that is not exertional. She does not look to be in overt heart failure at this point.  Would check routine labs before we consider a change in her Lasix dose.

## 2016-12-09 NOTE — Assessment & Plan Note (Signed)
Injection done today. Lab pending. See notes on labs.

## 2016-12-09 NOTE — Assessment & Plan Note (Signed)
Variable. Worse as the day goes on. Better with elevation. Already taking Lasix at baseline. She has shortness of breath at baseline. Reasonable to recheck routine labs today. Systolic blood pressure has been slightly elevated at home usually. See notes on labs. We did not change her Lasix yet.

## 2016-12-14 DIAGNOSIS — I509 Heart failure, unspecified: Secondary | ICD-10-CM | POA: Diagnosis not present

## 2016-12-14 DIAGNOSIS — J449 Chronic obstructive pulmonary disease, unspecified: Secondary | ICD-10-CM | POA: Diagnosis not present

## 2016-12-14 DIAGNOSIS — G4733 Obstructive sleep apnea (adult) (pediatric): Secondary | ICD-10-CM | POA: Diagnosis not present

## 2016-12-21 IMAGING — CT CT CHEST W/O CM
1 series · 13 of 34 positions shown, 17 images · non-contrast
Comparison: Chest CT 11/12/2014.

CLINICAL DATA: 74-year-old female with pulling sensation in the
right lower chest. Patient reportedly had a pneumothorax and
right-sided chest tube in place approximately 4-5 years ago.

EXAM:
CT CHEST WITHOUT CONTRAST
TECHNIQUE: Multidetector CT imaging of the chest was performed following the
standard protocol without IV contrast.

[Series 2: thorax · axial · 0.73mm/px · z∈[-635,-359]mm · 13 of 162 slices shown, 17 images]
[im 12/162  mediastinal]
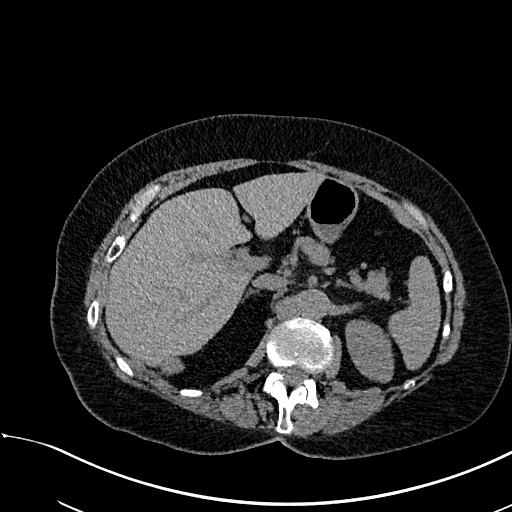
[im 12/162  lung]
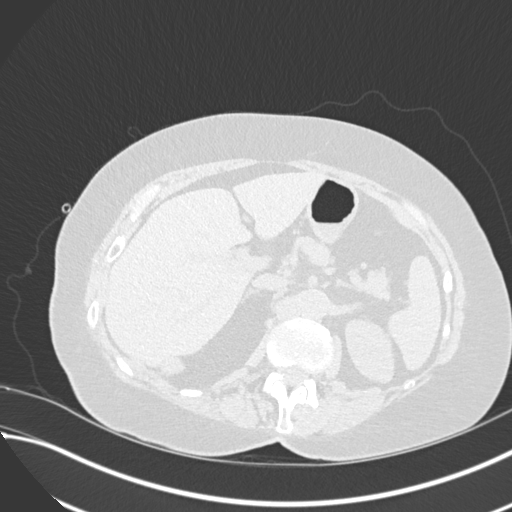
[im 24/162  lung]
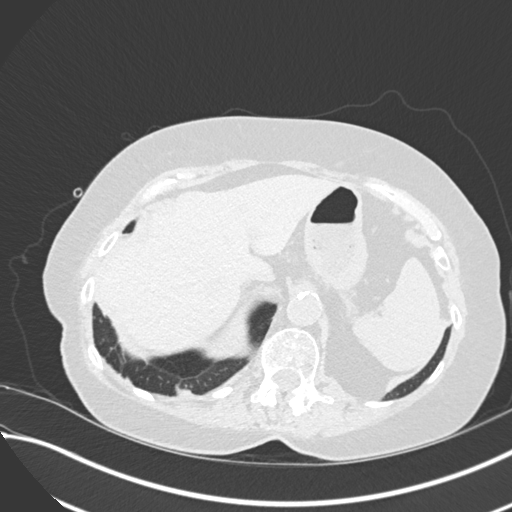
[im 36/162  lung]
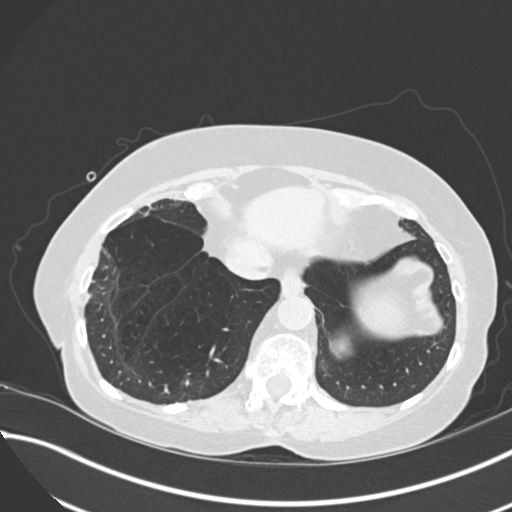
[im 48/162  lung]
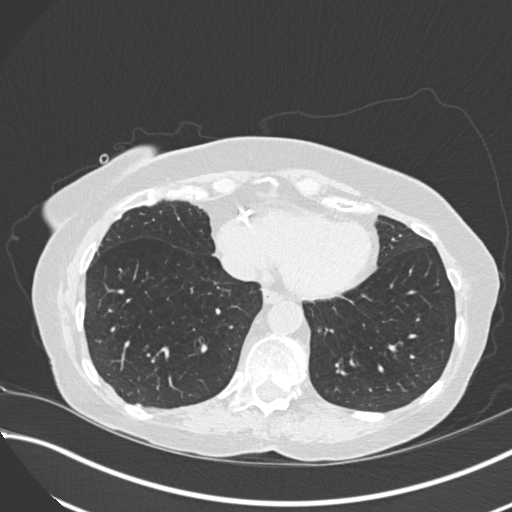
[im 65/162  mediastinal]
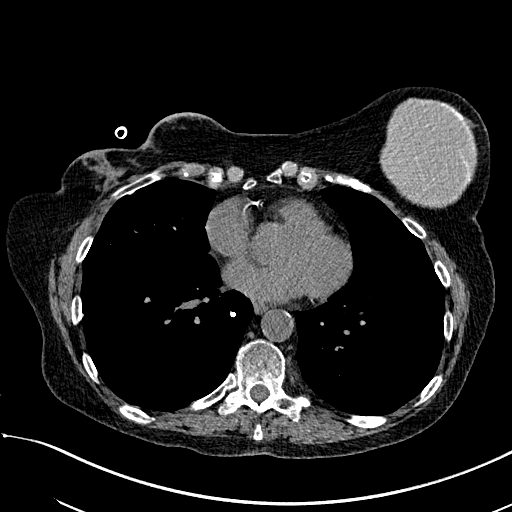
[im 65/162  lung]
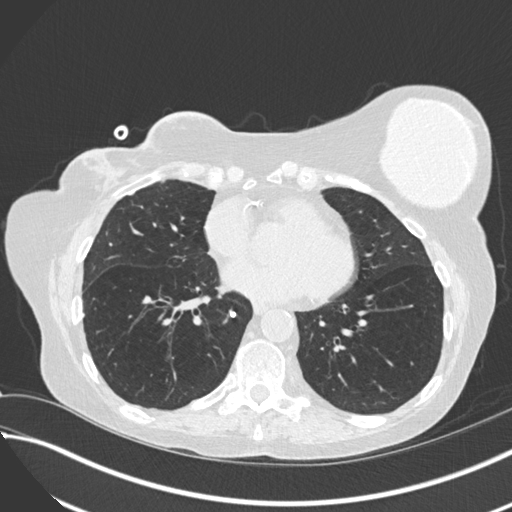
[im 72/162  lung]
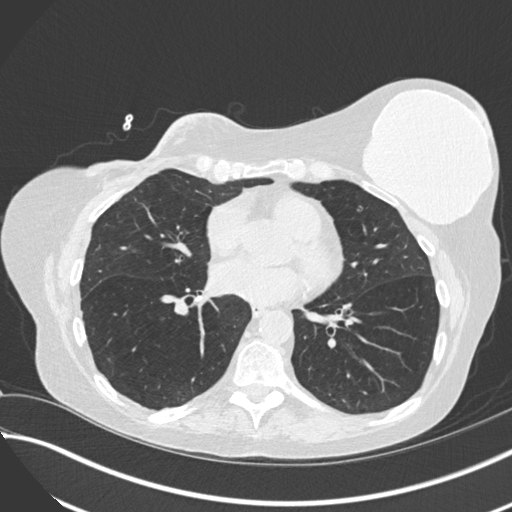
[im 84/162  lung]
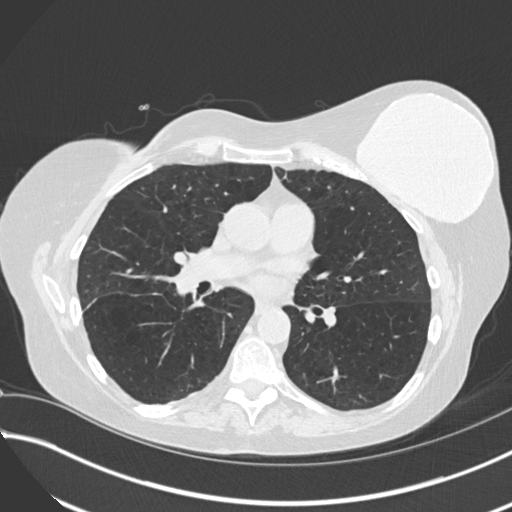
[im 90/162  lung]
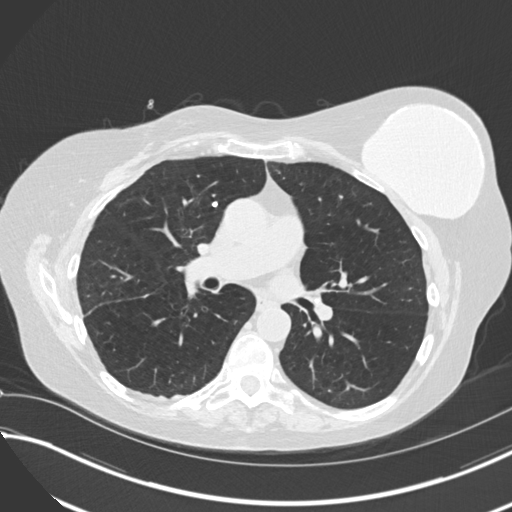
[im 97/162  mediastinal]
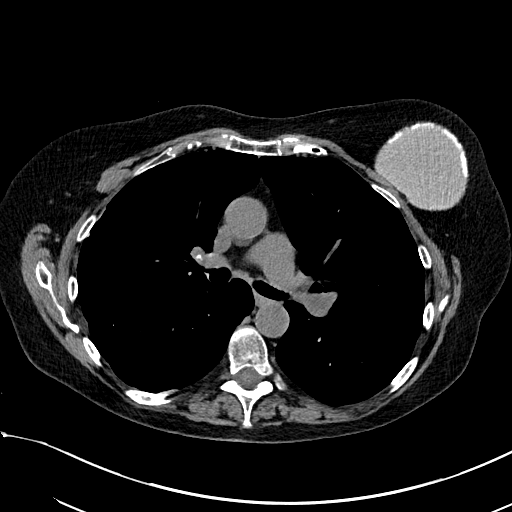
[im 97/162  lung]
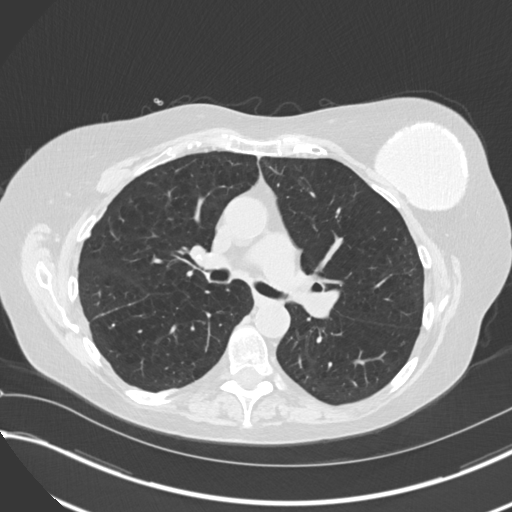
[im 114/162  lung]
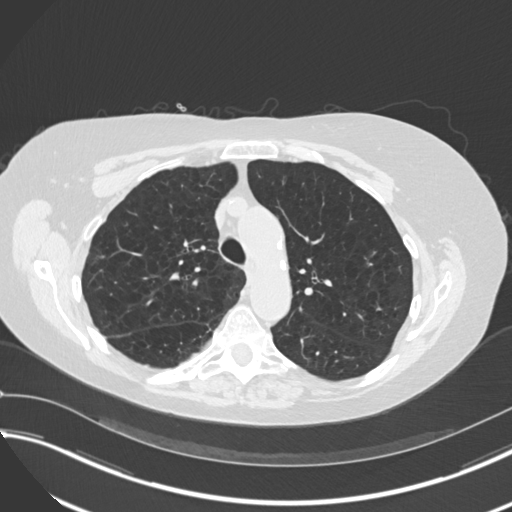
[im 126/162  lung]
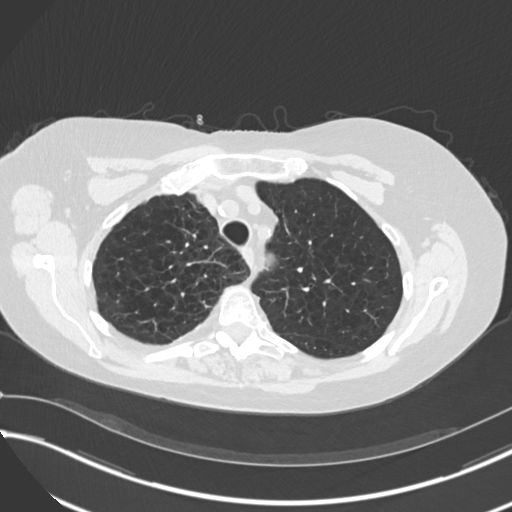
[im 138/162  lung]
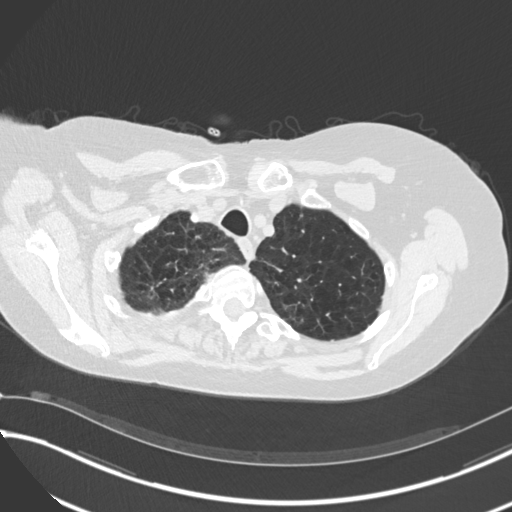
[im 150/162  mediastinal]
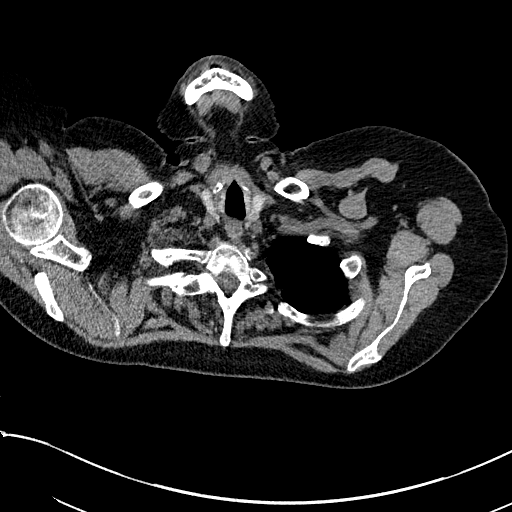
[im 150/162  lung]
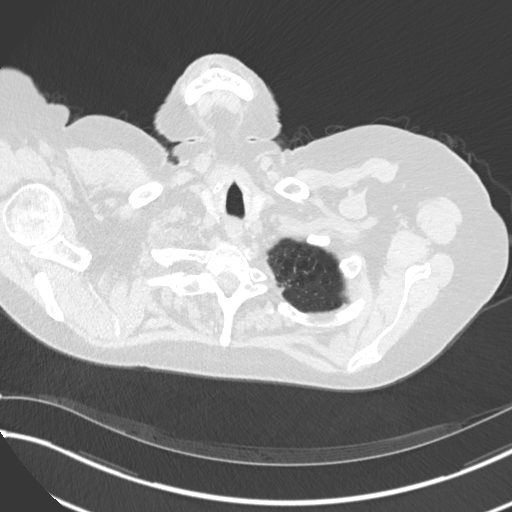

[13 of 34 positions shown; findings below may reference images not displayed]

FINDINGS: Mediastinum/Lymph Nodes: Heart size is normal. There is no
significant pericardial fluid, thickening or pericardial
calcification. There is atherosclerosis of the thoracic aorta, the
great vessels of the mediastinum and the coronary arteries,
including calcified atherosclerotic plaque in the left main, left
anterior descending, left circumflex and right coronary arteries. No
pathologically enlarged mediastinal or hilar lymph nodes. Please
note that accurate exclusion of hilar adenopathy is limited on
noncontrast CT scans. Esophagus is unremarkable in appearance. No
axillary lymphadenopathy.

Lungs/Pleura: New 13 x 7 mm (mean diameter of 10 mm) elongated
nodular opacity in the medial aspect of the left lower lobe (image
119 of series 3), favored to represent an area of post
infectious/inflammatory scarring. Previously noted nodule in the
apex of the right upper lobe measures 8 x 6 mm on today's
examination (image 28 of series 3), similar to the prior study,
likely part of the underlying pleural-parenchymal scarring in this
region, which is evidenced by additional areas of pleural
parenchymal thickening and nodularity. Small area of architectural
distortion in the periphery of the right upper lobe (image 53 of
series 3) slightly less conspicuous and less nodular in appearance
than the prior study, presumably some mucoid impaction and post
infectious scarring. No other new suspicious appearing pulmonary
nodules or masses are noted. No acute consolidative airspace
disease. No pleural effusions. Diffuse bronchial wall thickening
with moderate to severe centrilobular emphysema. A few scattered
calcified granulomas are again noted throughout the lungs
bilaterally.

Upper Abdomen: Multiple low-attenuation lesions are again noted in
the liver, incompletely characterized on today's noncontrast CT
examination, but similar to prior studies, likely to represent
cysts, with the largest of these lesions measuring 1.5 cm in segment
4A. Atherosclerosis. Calcified granulomas in the spleen.

Musculoskeletal/Soft Tissues: Status post explantation of the
patient's right breast implant. Again noted is architectural
distortion in the right chest wall at the site of resection which
extends to the underlying pectoralis major musculature. Status post
left subglandular breast implant with extensive capsular
calcification. Chronic compression fracture at T8 with approximately
20% loss of anterior vertebral body height is unchanged. There are
no aggressive appearing lytic or blastic lesions noted in the
visualized portions of the skeleton.
IMPRESSION: 1. No acute findings in the thorax to account for the patient's
symptoms.
2. Architectural distortion and postoperative scarring in the right
breast which appears similar to the prior study from 05/26/2013,
presumably related to prior right breast implant explantation. This
scar tissue appears to extend to the underlying right pectoralis
major musculature, and may be associated with the patient's
symptoms.
3. New elongated nodular density in the medial aspect of the left
lower lobe which has a mean diameter of 1 cm (image 119 of series
3). This is nonspecific and favored to represent an area of evolving
post infectious/ inflammatory scarring. Consider one of the
following in 3 months for both low-risk and high-risk individuals:
(a) repeat chest CT, (b) follow-up PET-CT, or (c) tissue sampling.
Given the high likelihood of benign disease, repeat chest CT is
favored course of action. This recommendation follows the consensus
statement: Guidelines for Management of Incidental Pulmonary Nodules
Detected on CT Images:From the [HOSPITAL] 4047; published
online before print (10.1148/[id number]).
4. Mild diffuse bronchial thickening with moderate to severe
centrilobular emphysema; imaging findings suggestive of underlying
COPD.
5. Atherosclerosis, including left main and 3 vessel coronary artery
disease. Assessment for potential risk factor modification, dietary
therapy or pharmacologic therapy may be warranted, if clinically
indicated.
6. Additional incidental findings, as above.

## 2016-12-22 ENCOUNTER — Other Ambulatory Visit: Payer: Self-pay | Admitting: Family Medicine

## 2016-12-22 NOTE — Telephone Encounter (Signed)
Electronic refill request.  Last office visit:   12/08/16 Last Filled:    60 tablet 0 10/05/2016  Is this prescribed by you? Please advise.

## 2016-12-23 ENCOUNTER — Other Ambulatory Visit: Payer: Self-pay | Admitting: Family Medicine

## 2016-12-23 DIAGNOSIS — I509 Heart failure, unspecified: Secondary | ICD-10-CM | POA: Diagnosis not present

## 2016-12-23 DIAGNOSIS — J449 Chronic obstructive pulmonary disease, unspecified: Secondary | ICD-10-CM | POA: Diagnosis not present

## 2016-12-23 DIAGNOSIS — G4733 Obstructive sleep apnea (adult) (pediatric): Secondary | ICD-10-CM | POA: Diagnosis not present

## 2016-12-23 NOTE — Telephone Encounter (Signed)
Last Rx 10/05/2016, given at ED. Last OV 12/08/2016

## 2016-12-23 NOTE — Telephone Encounter (Signed)
Please send this to Dr. Saralyn Pilar- it needs to come through him with cardiology.  Thanks

## 2016-12-23 NOTE — Telephone Encounter (Signed)
This needs to come through cardiology.  Thanks.

## 2016-12-30 DIAGNOSIS — R0609 Other forms of dyspnea: Secondary | ICD-10-CM | POA: Diagnosis not present

## 2016-12-30 DIAGNOSIS — J432 Centrilobular emphysema: Secondary | ICD-10-CM | POA: Diagnosis not present

## 2016-12-30 DIAGNOSIS — Z9981 Dependence on supplemental oxygen: Secondary | ICD-10-CM | POA: Diagnosis not present

## 2016-12-30 DIAGNOSIS — R918 Other nonspecific abnormal finding of lung field: Secondary | ICD-10-CM | POA: Diagnosis not present

## 2017-01-13 ENCOUNTER — Ambulatory Visit (INDEPENDENT_AMBULATORY_CARE_PROVIDER_SITE_OTHER): Payer: Medicare Other

## 2017-01-13 DIAGNOSIS — J449 Chronic obstructive pulmonary disease, unspecified: Secondary | ICD-10-CM | POA: Diagnosis not present

## 2017-01-13 DIAGNOSIS — E538 Deficiency of other specified B group vitamins: Secondary | ICD-10-CM

## 2017-01-13 DIAGNOSIS — I509 Heart failure, unspecified: Secondary | ICD-10-CM | POA: Diagnosis not present

## 2017-01-13 DIAGNOSIS — G4733 Obstructive sleep apnea (adult) (pediatric): Secondary | ICD-10-CM | POA: Diagnosis not present

## 2017-01-13 MED ORDER — CYANOCOBALAMIN 1000 MCG/ML IJ SOLN
1000.0000 ug | Freq: Once | INTRAMUSCULAR | Status: AC
  Administered 2017-01-13: 1000 ug via INTRAMUSCULAR

## 2017-01-22 DIAGNOSIS — I509 Heart failure, unspecified: Secondary | ICD-10-CM | POA: Diagnosis not present

## 2017-01-22 DIAGNOSIS — G4733 Obstructive sleep apnea (adult) (pediatric): Secondary | ICD-10-CM | POA: Diagnosis not present

## 2017-01-22 DIAGNOSIS — J449 Chronic obstructive pulmonary disease, unspecified: Secondary | ICD-10-CM | POA: Diagnosis not present

## 2017-02-02 ENCOUNTER — Other Ambulatory Visit: Payer: Self-pay | Admitting: Family Medicine

## 2017-02-09 DIAGNOSIS — I10 Essential (primary) hypertension: Secondary | ICD-10-CM | POA: Diagnosis not present

## 2017-02-09 DIAGNOSIS — I493 Ventricular premature depolarization: Secondary | ICD-10-CM | POA: Diagnosis not present

## 2017-02-09 DIAGNOSIS — J432 Centrilobular emphysema: Secondary | ICD-10-CM | POA: Diagnosis not present

## 2017-02-09 DIAGNOSIS — Z9889 Other specified postprocedural states: Secondary | ICD-10-CM | POA: Diagnosis not present

## 2017-02-09 DIAGNOSIS — I25118 Atherosclerotic heart disease of native coronary artery with other forms of angina pectoris: Secondary | ICD-10-CM | POA: Diagnosis not present

## 2017-02-13 DIAGNOSIS — G4733 Obstructive sleep apnea (adult) (pediatric): Secondary | ICD-10-CM | POA: Diagnosis not present

## 2017-02-13 DIAGNOSIS — J449 Chronic obstructive pulmonary disease, unspecified: Secondary | ICD-10-CM | POA: Diagnosis not present

## 2017-02-13 DIAGNOSIS — I509 Heart failure, unspecified: Secondary | ICD-10-CM | POA: Diagnosis not present

## 2017-02-16 ENCOUNTER — Ambulatory Visit (INDEPENDENT_AMBULATORY_CARE_PROVIDER_SITE_OTHER): Payer: Medicare Other

## 2017-02-16 DIAGNOSIS — E538 Deficiency of other specified B group vitamins: Secondary | ICD-10-CM | POA: Diagnosis not present

## 2017-02-16 MED ORDER — CYANOCOBALAMIN 1000 MCG/ML IJ SOLN
1000.0000 ug | Freq: Once | INTRAMUSCULAR | Status: AC
Start: 2017-02-16 — End: 2017-02-16
  Administered 2017-02-16: 1000 ug via INTRAMUSCULAR

## 2017-02-22 DIAGNOSIS — J449 Chronic obstructive pulmonary disease, unspecified: Secondary | ICD-10-CM | POA: Diagnosis not present

## 2017-02-22 DIAGNOSIS — I509 Heart failure, unspecified: Secondary | ICD-10-CM | POA: Diagnosis not present

## 2017-02-22 DIAGNOSIS — G4733 Obstructive sleep apnea (adult) (pediatric): Secondary | ICD-10-CM | POA: Diagnosis not present

## 2017-03-15 DIAGNOSIS — G4733 Obstructive sleep apnea (adult) (pediatric): Secondary | ICD-10-CM | POA: Diagnosis not present

## 2017-03-15 DIAGNOSIS — J449 Chronic obstructive pulmonary disease, unspecified: Secondary | ICD-10-CM | POA: Diagnosis not present

## 2017-03-15 DIAGNOSIS — I509 Heart failure, unspecified: Secondary | ICD-10-CM | POA: Diagnosis not present

## 2017-03-20 ENCOUNTER — Emergency Department: Payer: Medicare Other

## 2017-03-20 ENCOUNTER — Emergency Department
Admission: EM | Admit: 2017-03-20 | Discharge: 2017-03-21 | Disposition: A | Discharge status: Home / Self Care | Payer: Medicare Other | Attending: Emergency Medicine | Admitting: Emergency Medicine

## 2017-03-20 ENCOUNTER — Other Ambulatory Visit: Payer: Self-pay

## 2017-03-20 ENCOUNTER — Encounter: Payer: Self-pay | Admitting: Emergency Medicine

## 2017-03-20 DIAGNOSIS — Z7902 Long term (current) use of antithrombotics/antiplatelets: Secondary | ICD-10-CM | POA: Insufficient documentation

## 2017-03-20 DIAGNOSIS — I252 Old myocardial infarction: Secondary | ICD-10-CM | POA: Insufficient documentation

## 2017-03-20 DIAGNOSIS — J449 Chronic obstructive pulmonary disease, unspecified: Secondary | ICD-10-CM | POA: Diagnosis not present

## 2017-03-20 DIAGNOSIS — Z87891 Personal history of nicotine dependence: Secondary | ICD-10-CM | POA: Diagnosis not present

## 2017-03-20 DIAGNOSIS — I2510 Atherosclerotic heart disease of native coronary artery without angina pectoris: Secondary | ICD-10-CM | POA: Diagnosis not present

## 2017-03-20 DIAGNOSIS — I10 Essential (primary) hypertension: Secondary | ICD-10-CM | POA: Insufficient documentation

## 2017-03-20 DIAGNOSIS — R319 Hematuria, unspecified: Secondary | ICD-10-CM | POA: Diagnosis not present

## 2017-03-20 DIAGNOSIS — Z85828 Personal history of other malignant neoplasm of skin: Secondary | ICD-10-CM | POA: Diagnosis not present

## 2017-03-20 DIAGNOSIS — Z79899 Other long term (current) drug therapy: Secondary | ICD-10-CM | POA: Insufficient documentation

## 2017-03-20 DIAGNOSIS — N200 Calculus of kidney: Secondary | ICD-10-CM | POA: Diagnosis not present

## 2017-03-20 DIAGNOSIS — N132 Hydronephrosis with renal and ureteral calculous obstruction: Secondary | ICD-10-CM | POA: Diagnosis not present

## 2017-03-20 DIAGNOSIS — Z7982 Long term (current) use of aspirin: Secondary | ICD-10-CM | POA: Diagnosis not present

## 2017-03-20 DIAGNOSIS — R1030 Lower abdominal pain, unspecified: Secondary | ICD-10-CM | POA: Insufficient documentation

## 2017-03-20 LAB — COMPREHENSIVE METABOLIC PANEL
ALK PHOS: 71 U/L (ref 38–126)
ALT: 16 U/L (ref 14–54)
AST: 26 U/L (ref 15–41)
Albumin: 3.7 g/dL (ref 3.5–5.0)
Anion gap: 8 (ref 5–15)
BUN: 18 mg/dL (ref 6–20)
CALCIUM: 9.7 mg/dL (ref 8.9–10.3)
CO2: 30 mmol/L (ref 22–32)
CREATININE: 0.97 mg/dL (ref 0.44–1.00)
Chloride: 100 mmol/L — ABNORMAL LOW (ref 101–111)
GFR, EST NON AFRICAN AMERICAN: 56 mL/min — AB (ref >60)
Glucose, Bld: 119 mg/dL — ABNORMAL HIGH (ref 65–99)
Potassium: 3.7 mmol/L (ref 3.5–5.1)
Sodium: 138 mmol/L (ref 135–145)
Total Bilirubin: 0.6 mg/dL (ref 0.3–1.2)
Total Protein: 7.4 g/dL (ref 6.5–8.1)

## 2017-03-20 LAB — CBC
HEMATOCRIT: 40 % (ref 35.0–47.0)
HEMOGLOBIN: 13.1 g/dL (ref 12.0–16.0)
MCH: 29.3 pg (ref 26.0–34.0)
MCHC: 32.7 g/dL (ref 32.0–36.0)
MCV: 89.5 fL (ref 80.0–100.0)
Platelets: 272 10*3/uL (ref 150–440)
RBC: 4.47 MIL/uL (ref 3.80–5.20)
RDW: 14 % (ref 11.5–14.5)
WBC: 11.1 10*3/uL — ABNORMAL HIGH (ref 3.6–11.0)

## 2017-03-20 NOTE — ED Provider Notes (Signed)
[facility] Emergency Department Provider Note  Time seen: 11:03 PM  I have reviewed the triage vital signs and the nursing notes.   HISTORY  Chief Complaint Hematuria    HPI Mckenzie Mclaughlin is a 76 y.o. female with a past medical history of anxiety, arthritis, COPD on 2 L of oxygen 24/7, hypertension, hyperlipidemia, presents to the emergency department for hematuria.  According to the patient approxi-1 year ago she was having hematuria was diagnosed with a possible kidney stone as well as a mass on her kidney.  Patient is following up with urology Dr. Erlene Quan regarding this, states they are planning to do an MRI in the future.  Patient states today she has been feeling a sensation of fullness to her lower abdomen like she needs to urinate, when she was able to urinate she noted blood within her urine and mild dysuria.  Patient denies any abdominal "pain."  Denies any fever, no vomiting or diarrhea.   Past Medical History:  Diagnosis Date  . Anginal pain [facility])    see dr [name]  "Spams"  . Anxiety   . Arthritis   . Cancer (HCC)    side of head- skin cancer, squamous cell ca on left foot.  . Chronic airway obstruction (HCC)    chronic airway obstruction not elsewhere classified, unspecified  . Chronic obstructive asthma ([place])    unspecified  . Complication of anesthesia   . Constipation   . COPD (chronic obstructive pulmonary disease) ([place])   . COPD with emphysema ([place]) 01/05/2013  . COPD, severe 07/29/98  . Coronary artery disease 01/22/99   Non Q-wave MI, stent RCA  . Coronary atherosclerosis of native coronary artery 01/22/1999   status post stent RCA,   . Depression   . Dysrhythmia    Palpations at times. last time 10/29 ish 2014  . Esophageal dilatation    Pt needs appple sauce to take meds.  . Esophageal stricture    PT needs apple sauce to take meds  . GERD (gastroesophageal reflux disease) 08/29/98   severe-no meds now  . Head injury,  acute, with loss of consciousness ([place]) 11/2012   unsure how long  . History of blood transfusion   . History of blurry vision 05/06-05/10/2009   Hospital [facility] CP R/O'D Blurry vision, smoking  . History of CT scan of head 08/02/09   w/o mild age appropr atrophy  . History of ETT 08/03/09   Myoview Normal  . History of ETT 05/1999   Cardiolite pos ETT, neg Cardiolite  . History of ETT 09/02/01   Normal  . History of ETT 04/28/2006   Myoview normal EF 83%  . History of ETT 04/10/11   normal EF and no ischemia per Dr. Saralyn Pilar  . History of pneumothorax 01/05/2013  . Hx of cardiac catheterization 08/29/01   60% RCA 0/w 20-30% lesions  . Hx of cardiac catheterization 01/22/99   w/Stent 90% RCA lesion  . Hyperlipidemia, unspecified   . Hypertension    03/30/00  . Mental disorder   . On home oxygen therapy    as needed-has a travel tank  . Pneumothorax 2/14  . PONV (postoperative nausea and vomiting)    ad breast remocved and see was under anesthesia a long time  . Shortness of breath   . Status post aortic coarctation stent placement   . Tobacco abuse    1/4 pack daily    Patient Active Problem List   Diagnosis Date  Noted  . Unstable angina ([place]) 09/25/2016  . Dizziness 05/14/2016  . Atrophic vaginitis 02/28/2015  . Gross hematuria 02/26/2015  . Radicular pain in right arm 11/21/2014  . Pupil asymmetry 11/16/2014  . Osteoporosis 11/16/2014  . Senile purpura ([place]) 11/16/2014  . Right hand pain 11/16/2014  . Advance care planning 08/15/2014  . Other specified counseling 08/15/2014  . Constipation 06/21/2014  . NSTEMI (non-ST elevated myocardial infarction) ([place]) 12/17/2013  . Acute subendocardial infarction ([place]) 12/17/2013  . History of cardiac catheterization 09/15/2013  . Chest pain on exertion 08/25/2013  . Medicare annual wellness visit, subsequent 07/09/2013  . Beat, premature ventricular 06/26/2013  . Awareness of heartbeats 06/26/2013  . Arteriosclerosis of  coronary artery 01/05/2013  . Pulmonary emphysema ([place]) 01/05/2013  . Acid reflux 01/05/2013  . H/O pneumothorax 01/05/2013  . Neck pain 08/31/2012  . Anxiety and depression 07/11/2012  . Dysthymia 07/11/2012  . Esophageal dilatation   . Esophageal stricture   . Nutritional marasmus ([place]) 06/22/2012  . COPD with acute exacerbation ([place]) 06/17/2012  . Chronic obstructive pulmonary disease with acute exacerbation ([place]) 06/17/2012  . Lung nodule 04/17/2012  . Edema of foot 04/01/2012  . Paresthesia 10/27/2011  . Atypical chest pain 05/13/2011  . HLD (hyperlipidemia) 02/26/2011  . History of mammogram 02/26/2011  . Vertigo 02/18/2011  . B12 deficiency 01/31/2009  . HEADACHE 10/22/2006  . PREMATURE VENTRICULAR CONTRACTIONS, FREQUENT 07/19/2006  . HYPERTENSION 03/30/2000  . Essential (primary) hypertension 03/30/2000  . CORONARY ARTERY DISEASE 01/22/1999  . Atherosclerosis of coronary artery 01/22/1999  . GERD 08/29/1998  . Chronic obstructive pulmonary disease ([place]) 07/29/1998    Past Surgical History:  Procedure Laterality Date  . BACK SURGERY    . BREAST SURGERY     implant removal, R breast  . CARDIAC CATHETERIZATION    . CAROTID STENT Right 2000  . CATARACT EXTRACTION Bilateral    2016  . COLONOSCOPY    . ESOPHAGEAL DILATION  06/00   EGD  . LAMINECTOMY  1976   Disc Removal X 2 Lumbar  . MASTECTOMY  03/1976   Bilateral due FCBD with implants [facility])  . MASTECTOMY Left    due to fibrocystic breast disease  . OOPHORECTOMY    . TISSUE EXPANDER PLACEMENT Right 02/03/2013   Procedure: REMOVAL OF RIGHT BREAST IMPLANT AND IMPLANT MATERIAL  CAPSULECTOMY;  Surgeon: Irene Limbo, MD;  Location: [place];  Service: Plastics;  Laterality: Right;  . TOTAL ABDOMINAL HYSTERECTOMY W/ BILATERAL SALPINGOOPHORECTOMY      Prior to Admission medications   Medication Sig Start Date End Date Taking? Authorizing Provider  albuterol (PROAIR HFA) 108 (90 Base) MCG/ACT inhaler INHALE TWO  PUFFS BY MOUTH EVERY 6 HOURS AS NEEDED 08/13/15   Tonia Ghent, MD  ALPRAZolam Duanne Moron) 0.5 MG tablet Take 0.5 mg by mouth 3 (three) times daily.     [provider]  aspirin EC 81 MG tablet Take 81 mg by mouth daily.     [provider]  budesonide-formoterol (SYMBICORT) 160-4.5 MCG/ACT inhaler Inhale 2 puffs into the lungs 2 (two) times daily. 06/18/14   Tonia Ghent, MD  clopidogrel (PLAVIX) 75 MG tablet TAKE 1 TABLET BY MOUTH ONCE A DAY 12/10/15   Tonia Ghent, MD  diltiazem (CARDIZEM) 60 MG tablet Take 1 tablet (60 mg total) by mouth every 6 (six) hours. 12/08/16   Tonia Ghent, MD  furosemide (LASIX) 40 MG tablet Take 1 tablet (40 mg total) by mouth daily. 10/05/16  Epifanio Lesches, MD  ipratropium-albuterol (DUONEB) 0.5-2.5 (3) MG/3ML SOLN Take 3 mLs by nebulization every 4 (four) hours as needed. 10/05/16   Epifanio Lesches, MD  magnesium hydroxide (MILK OF MAGNESIA) 400 MG/5ML suspension Take 30 mLs by mouth daily. 10/05/16   Epifanio Lesches, MD  metoprolol succinate (TOPROL-XL) 25 MG 24 hr tablet Take 25 mg by mouth daily.    [provider]  metoprolol succinate (TOPROL-XL) 50 MG 24 hr tablet TAKE 1 TABLET BY MOUTH ONCE A DAY WITH OR IMMEDIATELY FOLLOWING A MEAL 02/02/17   Tonia Ghent, MD  nitroGLYCERIN (NITROSTAT) 0.4 MG SL tablet Place 1 tablet (0.4 mg total) under the tongue every 5 (five) minutes as needed for chest pain (max 3 doses in 15 min). 12/29/13   Tonia Ghent, MD  OXYGEN Inhale 2-3 L into the lungs continuous.    [provider]  ranolazine (RANEXA) 500 MG 12 hr tablet Take 1 tablet (500 mg total) by mouth 2 (two) times daily. 10/05/16   Epifanio Lesches, MD  tiotropium (SPIRIVA HANDIHALER) 18 MCG inhalation capsule INHALE ONE DOSE ONCE DAILY 07/04/14   [provider]  venlafaxine XR (EFFEXOR-XR) 75 MG 24 hr capsule Take 75 mg by mouth daily at 10 pm.    [provider]    Allergies  Allergen  Reactions  . Amoxicillin Shortness Of Breath  . Doxycycline Nausea Only and Other (See Comments)    Dizzy Reaction:  Dizziness   . Ibuprofen   . Sertraline Hcl Nausea And Vomiting    REACTION: trembling    Family History  Problem Relation Age of Onset  . Stroke Mother   . Heart disease Mother        MI  . Heart failure Mother   . Cancer Father        Lung  . Lung cancer Father   . COPD Brother   . Lung cancer Brother   . Heart disease Brother        CAD  . Heart disease Sister        cirrhosis due heart disease  . Cirrhosis Sister   . Coronary artery disease Sister   . Colon cancer Neg Hx   . Breast cancer Neg Hx     Social History Social History   Tobacco Use  . Smoking status: Former Smoker    Packs/day: 1.50    Years: 53.00    Pack years: 79.50    Types: Cigarettes    Last attempt to quit: 03/31/2011    Years since quitting: 5.9  . Smokeless tobacco: Never Used  . Tobacco comment: 1/4 PPD as of 2012  Substance Use Topics  . Alcohol use: No    Alcohol/week: 0.0 oz  . Drug use: No    Review of Systems Constitutional: Negative for fever Cardiovascular: Negative for chest pain. Respiratory: Negative for shortness of breath. Gastrointestinal: Negative for abdominal pain.  Positive for fullness like she needs to urinate per patient.  Negative for vomiting or diarrhea Genitourinary: Positive for dysuria, positive for hematuria. Musculoskeletal: Negative for back pain. Neurological: Negative for headache All other ROS negative  ____________________________________________   PHYSICAL EXAM:  VITAL SIGNS: ED Triage Vitals [03/20/17 1830]  Enc Vitals Group     BP (!) 155/78     Pulse Rate 83     Resp 16     Temp 97.6 F (36.4 C)     Temp src      SpO2 100 %  Weight 155 lb (70.3 kg)     Height 5\' 7"  (1.702 m)     Head Circumference      Peak Flow      Pain Score 3     Pain Loc      Pain Edu?      Excl. in [place]?    Constitutional: Alert. Well  appearing and in no distress. Eyes: Normal exam ENT   Head: Normocephalic and atraumatic.   Mouth/Throat: Mucous membranes are moist. Cardiovascular: Normal rate, regular rhythm.  Respiratory: Normal respiratory effort without tachypnea nor retractions. Breath sounds are clear  Gastrointestinal: Soft, mild suprapubic fullness, abdomen is nontender to palpation.  No rebound or guarding. Musculoskeletal: Nontender with normal range of motion in all extremities.  Neurologic:  Normal speech and language. No gross focal neurologic deficits Skin:  Skin is warm, dry and intact.  Psychiatric: Mood and affect are normal.   ____________________________________________   RADIOLOGY  IMPRESSION: 1. Increased size of left renal pelvis stone, now measuring 16 x 9 mm and causing moderate left hydronephrosis with perinephric and periureteral stranding. No stone in the ureter. 2. Multiple smaller left renal stones at the lower pole. 3. Aortic Atherosclerosis (ICD10-I70.0).  ____________________________________________   INITIAL IMPRESSION / ASSESSMENT AND PLAN / ED COURSE  Pertinent labs & imaging results that were available during my care of the patient were reviewed by me and considered in my medical decision making (see chart for details).  Patient presents to the emergency department for hematuria and dysuria beginning this morning.  Differential would include urinary tract infection, cystitis, ureterolithiasis, oncologic process.  We will check labs, urinary tract infection and proceed with a CT scan of the abdomen/pelvis to further evaluate.  Patient agreeable to this plan of care.  Blood work has resulted largely within normal limits with a slight leukocytosis and overall normal kidney function.  Patient believes she can urinate we will attempt to get the patient to the restroom to collect a urine sample and obtain a post void bladder scan in addition to a CT renal scan.  CT shows a  obstructing renal pelvis stone up to 16 mm.  Discussed this patient with Dr. Junious Silk of urology.  We will cover with antibiotics as a precaution, patient will follow up with Dr. Erlene Quan.  I discussed follow-up for any fever abdominal pain or urinary retention.  Patient agreeable to plan.  ____________________________________________   FINAL CLINICAL IMPRESSION(S) / ED DIAGNOSES  hematuria Kidney stone   Harvest Dark, MD 03/21/17 [phone]

## 2017-03-20 NOTE — ED Notes (Signed)
Patient transported to CT 

## 2017-03-20 NOTE — ED Notes (Signed)
Bladder scanned with a finding no greater than 8ml. MD made aware.

## 2017-03-20 NOTE — ED Notes (Signed)
Pt is on plavix 

## 2017-03-20 NOTE — ED Triage Notes (Signed)
Pt states dx with kidney stone and a mass on her kidney a year ago. Per pt this episode of blood in urine started today - pt to bathroom during triage with nurse. The urine in the toilet had clots of blood - unsure if from vagina or bladder. Pt states she has frequency, urgency and pain and that her stomach fills "full"

## 2017-03-21 LAB — URINALYSIS, COMPLETE (UACMP) WITH MICROSCOPIC
Bacteria, UA: NONE SEEN
SPECIFIC GRAVITY, URINE: 1.02 (ref 1.005–1.030)
SQUAMOUS EPITHELIAL / LPF: NONE SEEN

## 2017-03-21 MED ORDER — SULFAMETHOXAZOLE-TRIMETHOPRIM 800-160 MG PO TABS
1.0000 | ORAL_TABLET | Freq: Once | ORAL | Status: AC
  Administered 2017-03-21: 1 via ORAL
  Filled 2017-03-21: qty 1

## 2017-03-21 MED ORDER — SULFAMETHOXAZOLE-TRIMETHOPRIM 800-160 MG PO TABS: 1.0000 | ORAL_TABLET | Freq: Two times a day (BID) | ORAL | 0 refills | Status: DC

## 2017-03-21 NOTE — Discharge Instructions (Signed)
Please call to arrange a follow-up appointment with Dr. Erlene Quan as soon as possible.  Please take your antibiotics as prescribed.  Return to the emergency department for any fever, abdominal pain, or inability to urinate.

## 2017-03-23 LAB — URINE CULTURE: Culture: >=100000 — AB

## 2017-03-24 DIAGNOSIS — G4733 Obstructive sleep apnea (adult) (pediatric): Secondary | ICD-10-CM | POA: Diagnosis not present

## 2017-03-24 DIAGNOSIS — I509 Heart failure, unspecified: Secondary | ICD-10-CM | POA: Diagnosis not present

## 2017-03-24 DIAGNOSIS — J449 Chronic obstructive pulmonary disease, unspecified: Secondary | ICD-10-CM | POA: Diagnosis not present

## 2017-03-28 IMAGING — US US ABDOMEN LIMITED
1 series · 14 of 25 positions shown · non-contrast
Comparison: 12/01/2012

CLINICAL DATA: Right upper quadrant pain with nausea

EXAM:
US ABDOMEN LIMITED - RIGHT UPPER QUADRANT

[Series 1: us abdomen limited · 0.21mm/px · 14 of 34 slices shown]
[im 1/34]
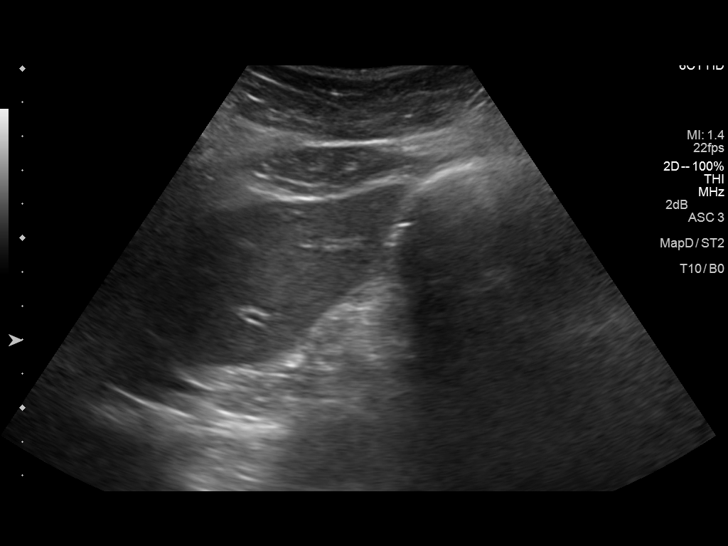
[im 3/34]
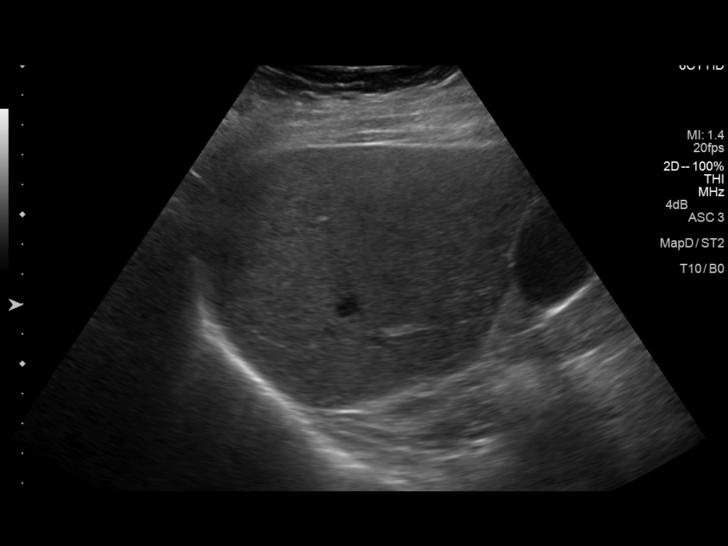
[im 6/34]
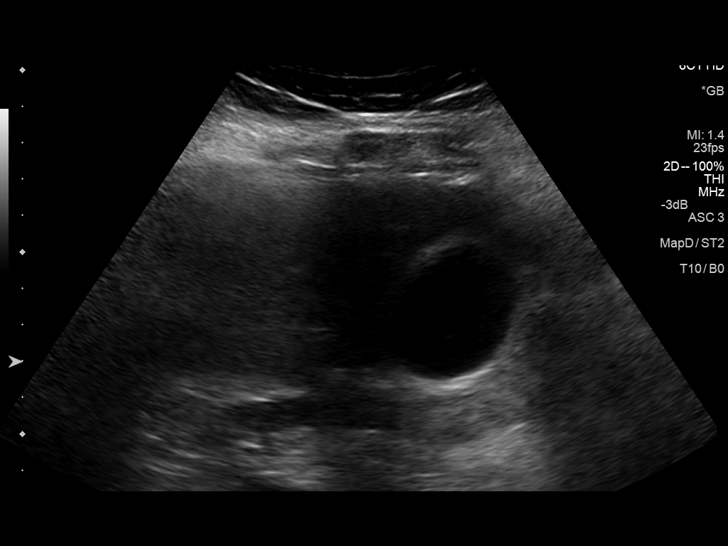
[im 9/34]
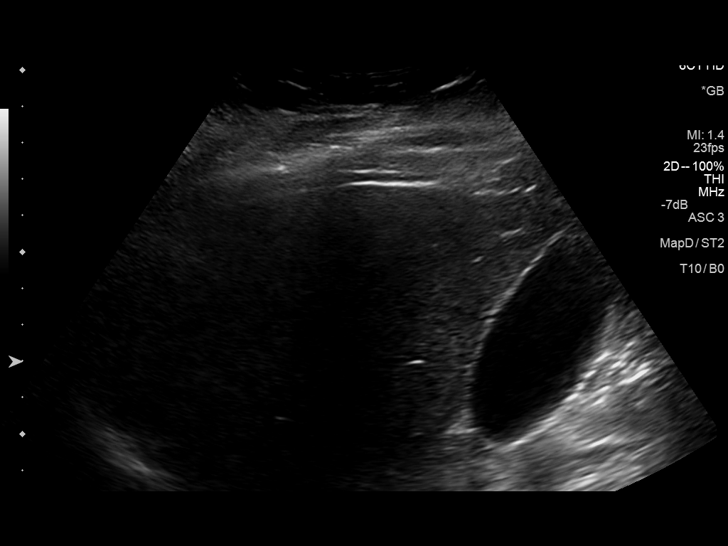
[im 12/34]
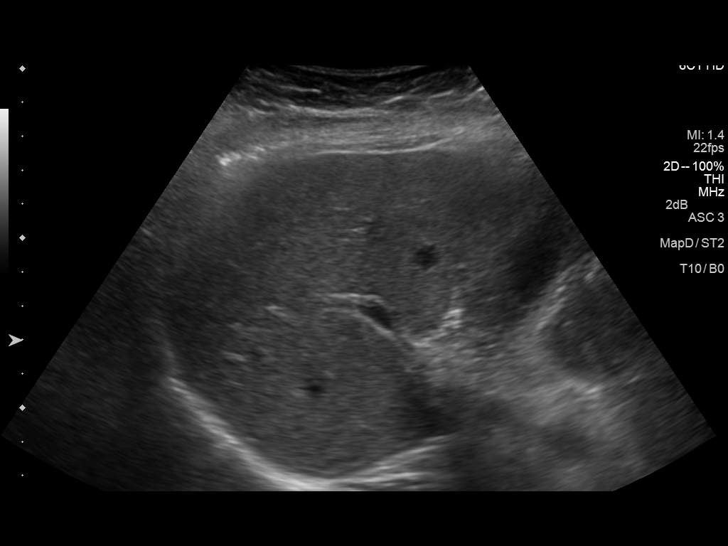
[im 13/34]
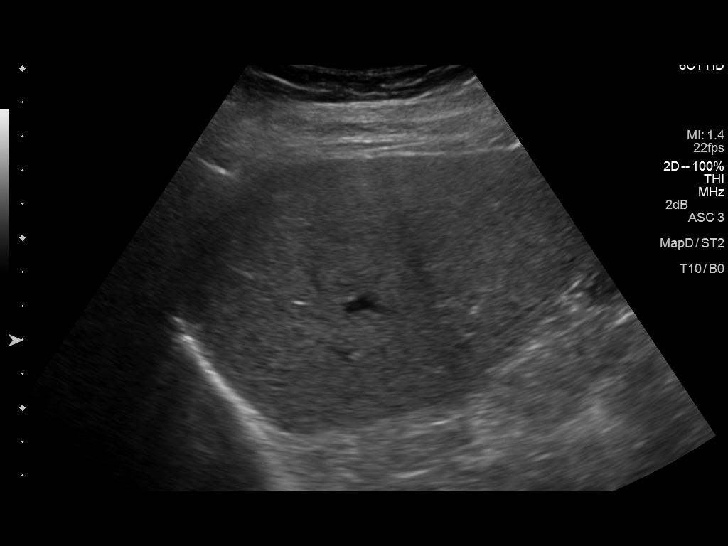
[im 16/34]
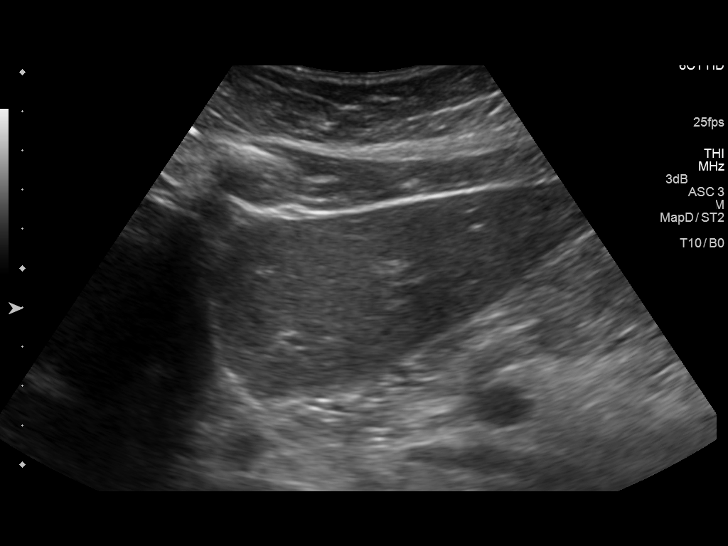
[im 18/34]
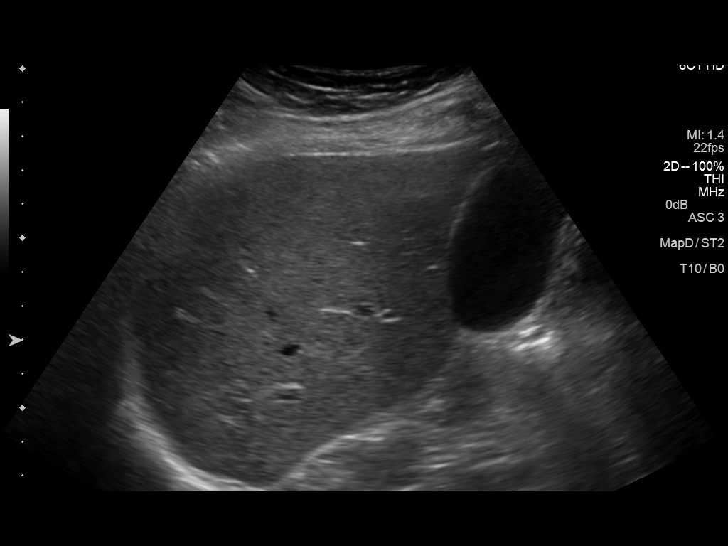
[im 21/34]
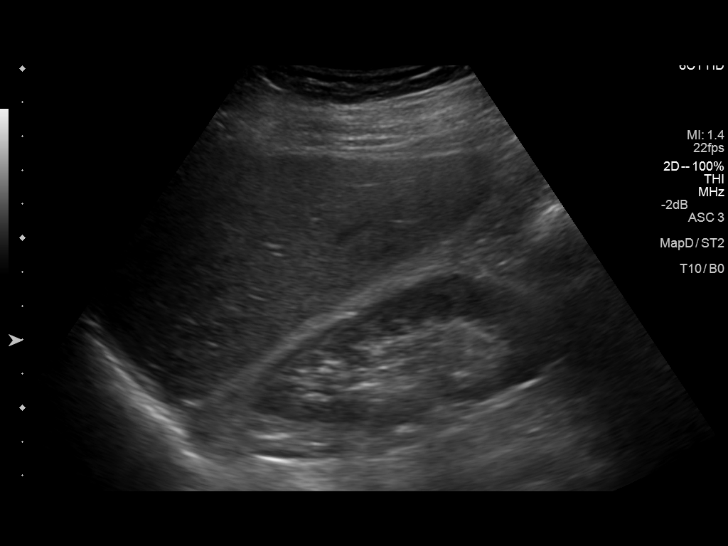
[im 23/34]
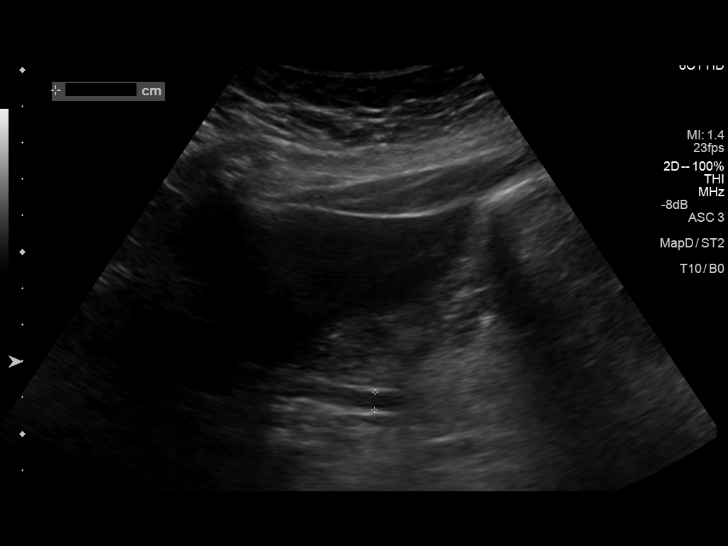
[im 25/34]
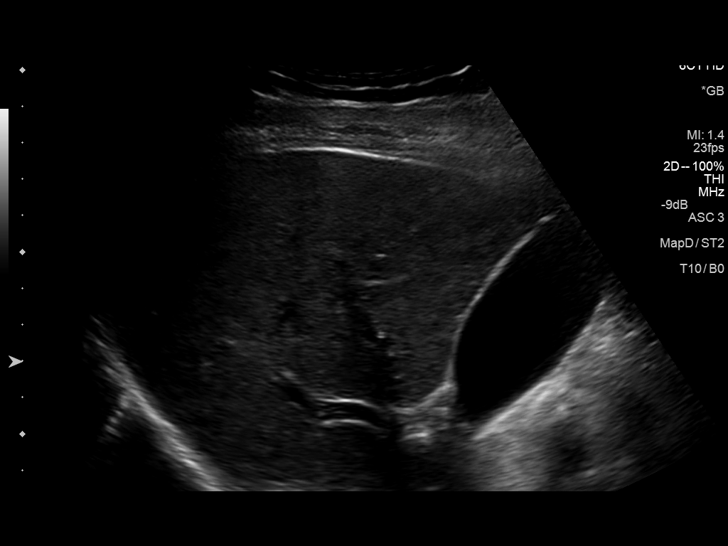
[im 28/34]
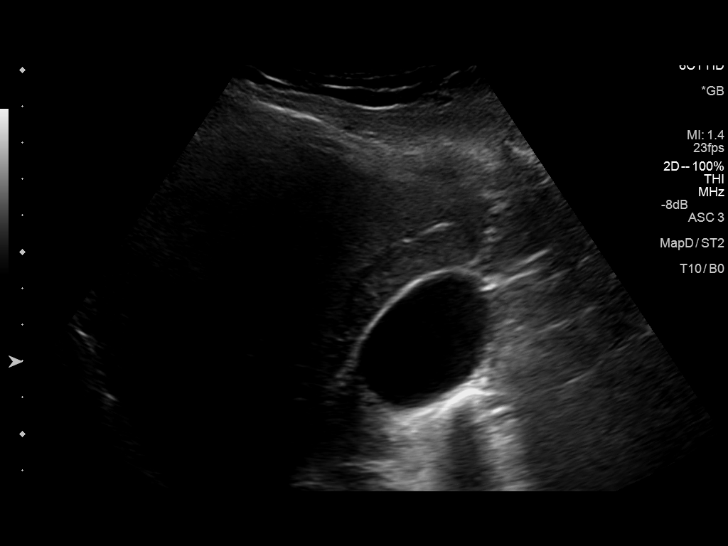
[im 31/34]
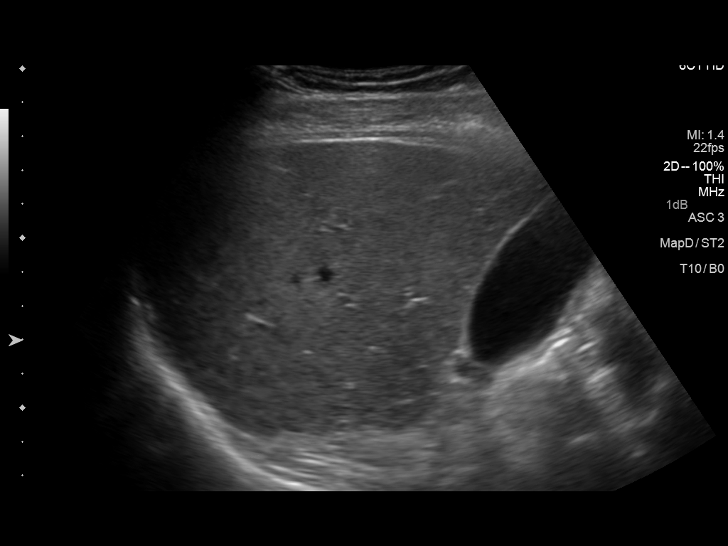
[im 34/34]
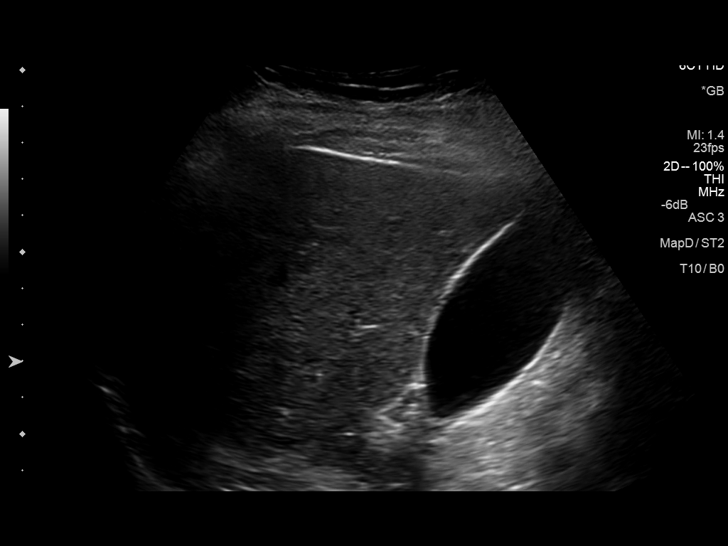

[14 of 25 positions shown; findings below may reference images not displayed]

FINDINGS: Gallbladder:

No gallstones or wall thickening visualized. Suspected gallstones on
abdominal CT may reflect folds at the gallbladder neck. No
sonographic Murphy sign noted.

Common bile duct:

Diameter: 4 mm

Liver:

No focal lesion identified (left liver cyst seen on CT from earlier
today is not visualized). Within normal limits in parenchymal
echogenicity. Antegrade flow in the imaged portal venous system
IMPRESSION: Normal right upper quadrant ultrasound.

## 2017-03-30 DIAGNOSIS — Z87442 Personal history of urinary calculi: Secondary | ICD-10-CM

## 2017-03-30 HISTORY — DX: Personal history of urinary calculi: Z87.442

## 2017-04-01 ENCOUNTER — Other Ambulatory Visit: Payer: Self-pay | Admitting: Family Medicine

## 2017-04-01 DIAGNOSIS — N200 Calculus of kidney: Secondary | ICD-10-CM

## 2017-04-01 NOTE — Telephone Encounter (Signed)
Electronic refill request. Alprazolam Last office visit:   12/08/16 Last Filled:   Historical, Sherilyn Dacosta, NP Please advise.   Electronic refill request. Clopidogrel Patient seen at ER 03/20/17 for hematuria Last office visit:   12/08/16 Last Filled:     30 tablet 12 12/10/2015  Please advise.

## 2017-04-02 NOTE — Telephone Encounter (Signed)
Patient has not seen Urology and doesn't know anything about an upcoming appointment.  Patient says she fell in the bathtub yesterday and is real bruised up and doesn't know if her arm needs stitches or not.

## 2017-04-02 NOTE — Telephone Encounter (Signed)
Please call in xanax.  Thanks.  Has she seen urology?

## 2017-04-03 ENCOUNTER — Emergency Department
Admission: EM | Admit: 2017-04-03 | Discharge: 2017-04-03 | Disposition: A | Discharge status: Home / Self Care | Payer: Medicare Other | Attending: Student in an Organized Health Care Education/Training Program | Admitting: Student in an Organized Health Care Education/Training Program

## 2017-04-03 ENCOUNTER — Other Ambulatory Visit: Payer: Self-pay

## 2017-04-03 ENCOUNTER — Encounter: Payer: Self-pay | Admitting: Emergency Medicine

## 2017-04-03 DIAGNOSIS — Y92002 Bathroom of unspecified non-institutional (private) residence single-family (private) house as the place of occurrence of the external cause: Secondary | ICD-10-CM | POA: Insufficient documentation

## 2017-04-03 DIAGNOSIS — I2510 Atherosclerotic heart disease of native coronary artery without angina pectoris: Secondary | ICD-10-CM | POA: Diagnosis not present

## 2017-04-03 DIAGNOSIS — Y92009 Unspecified place in unspecified non-institutional (private) residence as the place of occurrence of the external cause: Secondary | ICD-10-CM

## 2017-04-03 DIAGNOSIS — I10 Essential (primary) hypertension: Secondary | ICD-10-CM | POA: Diagnosis not present

## 2017-04-03 DIAGNOSIS — Z9981 Dependence on supplemental oxygen: Secondary | ICD-10-CM | POA: Insufficient documentation

## 2017-04-03 DIAGNOSIS — Z79899 Other long term (current) drug therapy: Secondary | ICD-10-CM | POA: Diagnosis not present

## 2017-04-03 DIAGNOSIS — Z7982 Long term (current) use of aspirin: Secondary | ICD-10-CM | POA: Diagnosis not present

## 2017-04-03 DIAGNOSIS — J45909 Unspecified asthma, uncomplicated: Secondary | ICD-10-CM | POA: Insufficient documentation

## 2017-04-03 DIAGNOSIS — S61419A Laceration without foreign body of unspecified hand, initial encounter: Secondary | ICD-10-CM | POA: Diagnosis not present

## 2017-04-03 DIAGNOSIS — W19XXXA Unspecified fall, initial encounter: Secondary | ICD-10-CM | POA: Insufficient documentation

## 2017-04-03 DIAGNOSIS — Z7902 Long term (current) use of antithrombotics/antiplatelets: Secondary | ICD-10-CM | POA: Insufficient documentation

## 2017-04-03 DIAGNOSIS — Z87891 Personal history of nicotine dependence: Secondary | ICD-10-CM | POA: Insufficient documentation

## 2017-04-03 DIAGNOSIS — Y939 Activity, unspecified: Secondary | ICD-10-CM | POA: Insufficient documentation

## 2017-04-03 DIAGNOSIS — J449 Chronic obstructive pulmonary disease, unspecified: Secondary | ICD-10-CM | POA: Diagnosis not present

## 2017-04-03 DIAGNOSIS — Y999 Unspecified external cause status: Secondary | ICD-10-CM | POA: Diagnosis not present

## 2017-04-03 DIAGNOSIS — S51811A Laceration without foreign body of right forearm, initial encounter: Secondary | ICD-10-CM | POA: Diagnosis not present

## 2017-04-03 MED ORDER — BACITRACIN ZINC 500 UNIT/GM EX OINT
TOPICAL_OINTMENT | Freq: Once | CUTANEOUS | Status: AC
  Administered 2017-04-03: 16:00:00 via TOPICAL
  Filled 2017-04-03: qty 0.9

## 2017-04-03 NOTE — Discharge Instructions (Signed)
You have sustained a large skin tear to the hand. Keep the wound clean, dry, and covered. Use the supplies to dress the wound daily.

## 2017-04-03 NOTE — ED Notes (Signed)
Patient's wound was dressed by PA prior to this RN's assessment.

## 2017-04-03 NOTE — ED Notes (Signed)
Pt reports that she does not want an xray just wants the bleeding to stop. Has no other complaints.

## 2017-04-03 NOTE — ED Triage Notes (Signed)
Pt reports that she fell on the third of Jan and has skin tears. Pt has several skin tears that are still bleeding.

## 2017-04-03 NOTE — ED Provider Notes (Signed)
[facility] Emergency Department Provider Note ____________________________________________  Time seen: 1524  I have reviewed the triage vital signs and the nursing notes.  HISTORY  Chief Complaint  Fall  HPI Mckenzie Mclaughlin is a 77 y.o. female presents to the ED accompanied by her husband, for evaluation of a skin tear to the right forearm.  Patient describes falling about 2 days prior, when she sustained a large skin tear to her right forearm.  She describes hitting the forearm on the bathroom sink, but denies any musculoskeletal pain or disability.  She has been dressing her skin tear daily with nonstick gauze, after daily hydrogen peroxide cleansing.  She presents with complaints of continued bleeding to the skin tear.  No other injury reported at this time.  Past Medical History:  Diagnosis Date  . Anginal pain [facility])    see dr [name]  "Spams"  . Anxiety   . Arthritis   . Cancer (HCC)    side of head- skin cancer, squamous cell ca on left foot.  . Chronic airway obstruction (HCC)    chronic airway obstruction not elsewhere classified, unspecified  . Chronic obstructive asthma ([place])    unspecified  . Complication of anesthesia   . Constipation   . COPD (chronic obstructive pulmonary disease) ([place])   . COPD with emphysema ([place]) 01/05/2013  . COPD, severe 07/29/98  . Coronary artery disease 01/22/99   Non Q-wave MI, stent RCA  . Coronary atherosclerosis of native coronary artery 01/22/1999   status post stent RCA,   . Depression   . Dysrhythmia    Palpations at times. last time 10/29 ish 2014  . Esophageal dilatation    Pt needs appple sauce to take meds.  . Esophageal stricture    PT needs apple sauce to take meds  . GERD (gastroesophageal reflux disease) 08/29/98   severe-no meds now  . Head injury, acute, with loss of consciousness ([place]) 11/2012   unsure how long  . History of blood transfusion   . History of blurry vision  05/06-05/10/2009   Hospital [facility] CP R/O'D Blurry vision, smoking  . History of CT scan of head 08/02/09   w/o mild age appropr atrophy  . History of ETT 08/03/09   Myoview Normal  . History of ETT 05/1999   Cardiolite pos ETT, neg Cardiolite  . History of ETT 09/02/01   Normal  . History of ETT 04/28/2006   Myoview normal EF 83%  . History of ETT 04/10/11   normal EF and no ischemia per Dr. Saralyn Pilar  . History of pneumothorax 01/05/2013  . Hx of cardiac catheterization 08/29/01   60% RCA 0/w 20-30% lesions  . Hx of cardiac catheterization 01/22/99   w/Stent 90% RCA lesion  . Hyperlipidemia, unspecified   . Hypertension    03/30/00  . Mental disorder   . On home oxygen therapy    as needed-has a travel tank  . Pneumothorax 2/14  . PONV (postoperative nausea and vomiting)    ad breast remocved and see was under anesthesia a long time  . Shortness of breath   . Status post aortic coarctation stent placement   . Tobacco abuse    1/4 pack daily    Patient Active Problem List   Diagnosis Date Noted  . Unstable angina ([place]) 09/25/2016  . Dizziness 05/14/2016  . Atrophic vaginitis 02/28/2015  . Gross hematuria 02/26/2015  . Radicular pain in right arm 11/21/2014  . Pupil asymmetry 11/16/2014  .  Osteoporosis 11/16/2014  . Senile purpura ([place]) 11/16/2014  . Right hand pain 11/16/2014  . Advance care planning 08/15/2014  . Other specified counseling 08/15/2014  . Constipation 06/21/2014  . NSTEMI (non-ST elevated myocardial infarction) ([place]) 12/17/2013  . Acute subendocardial infarction ([place]) 12/17/2013  . History of cardiac catheterization 09/15/2013  . Chest pain on exertion 08/25/2013  . Medicare annual wellness visit, subsequent 07/09/2013  . Beat, premature ventricular 06/26/2013  . Awareness of heartbeats 06/26/2013  . Arteriosclerosis of coronary artery 01/05/2013  . Pulmonary emphysema ([place]) 01/05/2013  . Acid reflux 01/05/2013  . H/O pneumothorax 01/05/2013  .  Neck pain 08/31/2012  . Anxiety and depression 07/11/2012  . Dysthymia 07/11/2012  . Esophageal dilatation   . Esophageal stricture   . Nutritional marasmus ([place]) 06/22/2012  . COPD with acute exacerbation ([place]) 06/17/2012  . Chronic obstructive pulmonary disease with acute exacerbation ([place]) 06/17/2012  . Lung nodule 04/17/2012  . Edema of foot 04/01/2012  . Paresthesia 10/27/2011  . Atypical chest pain 05/13/2011  . HLD (hyperlipidemia) 02/26/2011  . History of mammogram 02/26/2011  . Vertigo 02/18/2011  . B12 deficiency 01/31/2009  . HEADACHE 10/22/2006  . PREMATURE VENTRICULAR CONTRACTIONS, FREQUENT 07/19/2006  . HYPERTENSION 03/30/2000  . Essential (primary) hypertension 03/30/2000  . CORONARY ARTERY DISEASE 01/22/1999  . Atherosclerosis of coronary artery 01/22/1999  . GERD 08/29/1998  . Chronic obstructive pulmonary disease ([place]) 07/29/1998    Past Surgical History:  Procedure Laterality Date  . BACK SURGERY    . BREAST SURGERY     implant removal, R breast  . CARDIAC CATHETERIZATION    . CAROTID STENT Right 2000  . CATARACT EXTRACTION Bilateral    2016  . COLONOSCOPY    . ESOPHAGEAL DILATION  06/00   EGD  . LAMINECTOMY  1976   Disc Removal X 2 Lumbar  . MASTECTOMY  03/1976   Bilateral due FCBD with implants [facility])  . MASTECTOMY Left    due to fibrocystic breast disease  . OOPHORECTOMY    . TISSUE EXPANDER PLACEMENT Right 02/03/2013   Procedure: REMOVAL OF RIGHT BREAST IMPLANT AND IMPLANT MATERIAL  CAPSULECTOMY;  Surgeon: Irene Limbo, MD;  Location: [facility];  Service: Plastics;  Laterality: Right;  . TOTAL ABDOMINAL HYSTERECTOMY W/ BILATERAL SALPINGOOPHORECTOMY      Prior to Admission medications   Medication Sig Start Date End Date Taking? Authorizing Provider  albuterol [facility] HFA) 108 (90 Base) MCG/ACT inhaler INHALE TWO PUFFS BY MOUTH EVERY 6 HOURS AS NEEDED 08/13/15   Tonia Ghent, MD  ALPRAZolam Duanne Moron) 0.5 MG tablet TAKE 1 TABLET BY MOUTH 3  TIMES A DAY AS NEEDED 04/02/17   Tonia Ghent, MD  aspirin EC 81 MG tablet Take 81 mg by mouth daily.     [provider]  budesonide-formoterol (SYMBICORT) 160-4.5 MCG/ACT inhaler Inhale 2 puffs into the lungs 2 (two) times daily. 06/18/14   Tonia Ghent, MD  clopidogrel (PLAVIX) 75 MG tablet TAKE 1 TABLET BY MOUTH ONCE A DAY 04/02/17   Tonia Ghent, MD  diltiazem (CARDIZEM) 60 MG tablet Take 1 tablet (60 mg total) by mouth every 6 (six) hours. 12/08/16   Tonia Ghent, MD  furosemide (LASIX) 40 MG tablet Take 1 tablet (40 mg total) by mouth daily. 10/05/16   Epifanio Lesches, MD  ipratropium-albuterol (DUONEB) 0.5-2.5 (3) MG/3ML SOLN Take 3 mLs by nebulization every 4 (four) hours as needed. 10/05/16   Epifanio Lesches, MD  magnesium hydroxide (MILK OF MAGNESIA) 400  MG/5ML suspension Take 30 mLs by mouth daily. 10/05/16   Epifanio Lesches, MD  metoprolol succinate (TOPROL-XL) 25 MG 24 hr tablet Take 25 mg by mouth daily.    [provider]  metoprolol succinate (TOPROL-XL) 50 MG 24 hr tablet TAKE 1 TABLET BY MOUTH ONCE A DAY WITH OR IMMEDIATELY FOLLOWING A MEAL 02/02/17   Tonia Ghent, MD  nitroGLYCERIN (NITROSTAT) 0.4 MG SL tablet Place 1 tablet (0.4 mg total) under the tongue every 5 (five) minutes as needed for chest pain (max 3 doses in 15 min). 12/29/13   Tonia Ghent, MD  OXYGEN Inhale 2-3 L into the lungs continuous.    [provider]  ranolazine (RANEXA) 500 MG 12 hr tablet Take 1 tablet (500 mg total) by mouth 2 (two) times daily. 10/05/16   Epifanio Lesches, MD  sulfamethoxazole-trimethoprim (BACTRIM DS,SEPTRA DS) 800-160 MG tablet Take 1 tablet by mouth 2 (two) times daily. 03/21/17   Harvest Dark, MD  tiotropium (SPIRIVA HANDIHALER) 18 MCG inhalation capsule INHALE ONE DOSE ONCE DAILY 07/04/14   [provider]  venlafaxine XR (EFFEXOR-XR) 75 MG 24 hr capsule Take 75 mg by mouth daily at 10 pm.    [provider]     Allergies Amoxicillin; Doxycycline; Ibuprofen; and Sertraline hcl  Family History  Problem Relation Age of Onset  . Stroke Mother   . Heart disease Mother        MI  . Heart failure Mother   . Cancer Father        Lung  . Lung cancer Father   . COPD Brother   . Lung cancer Brother   . Heart disease Brother        CAD  . Heart disease Sister        cirrhosis due heart disease  . Cirrhosis Sister   . Coronary artery disease Sister   . Colon cancer Neg Hx   . Breast cancer Neg Hx     Social History Social History   Tobacco Use  . Smoking status: Former Smoker    Packs/day: 1.50    Years: 53.00    Pack years: 79.50    Types: Cigarettes    Last attempt to quit: 03/31/2011    Years since quitting: 6.0  . Smokeless tobacco: Never Used  . Tobacco comment: 1/4 PPD as of 2012  Substance Use Topics  . Alcohol use: No    Alcohol/week: 0.0 oz  . Drug use: No    Review of Systems  Constitutional: Negative for fever. Cardiovascular: Negative for chest pain. Respiratory: Negative for shortness of breath. Musculoskeletal: Negative for back pain. Skin: Negative for rash.  Skin tear to the right forearm as above. Neurological: Negative for headaches, focal weakness or numbness. ____________________________________________  PHYSICAL EXAM:  VITAL SIGNS: ED Triage Vitals  Enc Vitals Group     BP 04/03/17 1348 (!) 120/58     Pulse Rate 04/03/17 1348 71     Resp 04/03/17 1348 16     Temp 04/03/17 1348 97.8 F (36.6 C)     Temp Source 04/03/17 1348 Oral     SpO2 04/03/17 1348 100 %     Weight 04/03/17 1349 155 lb (70.3 kg)     Height 04/03/17 1349 5\' 7"  (1.702 m)     Head Circumference --      Peak Flow --      Pain Score 04/03/17 1354 4     Pain Loc --  Pain Edu? --      Excl. in [place]? --     Constitutional: Alert and oriented. Well appearing and in no distress. Head: Normocephalic and atraumatic. Cardiovascular: Normal rate, regular rhythm. Normal distal  pulses. Respiratory: Normal respiratory effort. No wheezes/rales/rhonchi. Musculoskeletal: Nontender with normal range of motion in all extremities.  Neurologic:  Normal gait without ataxia. Normal speech and language. No gross focal neurologic deficits are appreciated. Skin:  Skin is warm, dry and intact. No rash noted. Large skin tear to the dorsolateral right forearm. No signs of skin infection. Near-normal approximation of skin edges.  Psychiatric: Mood and affect are normal. Patient exhibits appropriate insight and judgment. ___________________________________________  PROCEDURES  Procedures Wound cleansed with wound soap & saline Bacitracin ointment applied Petroleum jelly gauze applied. Telfa applied Gauze wrap + tube gauze applied ____________________________________________  INITIAL IMPRESSION / ASSESSMENT AND PLAN / ED COURSE  ED evaluation of a skin tear to the right forearm.  Patient's wound is overall healing well.  Patient is advised that the use of daily hydrogen peroxide with slow and deteriorating wound healing.  She is advised to keep the wound clean, dry, and covered.  Wound care supplies were provided.  Patient verbalizes understanding and will follow-up as needed. ____________________________________________  FINAL CLINICAL IMPRESSION(S) / ED DIAGNOSES  Final diagnoses:  Fall in home, initial encounter  Skin tear of hand without complication, initial encounter      Melvenia Needles, PA-C 04/03/17 2328    Merlyn Lot, MD 04/03/17 [phone]

## 2017-04-04 NOTE — Telephone Encounter (Signed)
Seen at ER in the meantime about the skin tear.  She has a 57mm stone.  Per ER notes, she was to f/u with urology.  I put in a referral.  Thanks.

## 2017-04-05 ENCOUNTER — Encounter: Payer: Self-pay | Admitting: Family Medicine

## 2017-04-05 ENCOUNTER — Ambulatory Visit (INDEPENDENT_AMBULATORY_CARE_PROVIDER_SITE_OTHER): Payer: Medicare Other | Admitting: Family Medicine

## 2017-04-05 VITALS — BP 122/62 | HR 71 | Temp 98.3°F | Wt 158.0 lb

## 2017-04-05 DIAGNOSIS — Z23 Encounter for immunization: Secondary | ICD-10-CM | POA: Diagnosis not present

## 2017-04-05 DIAGNOSIS — Y92009 Unspecified place in unspecified non-institutional (private) residence as the place of occurrence of the external cause: Secondary | ICD-10-CM

## 2017-04-05 DIAGNOSIS — W182XXA Fall in (into) shower or empty bathtub, initial encounter: Secondary | ICD-10-CM | POA: Diagnosis not present

## 2017-04-05 DIAGNOSIS — E538 Deficiency of other specified B group vitamins: Secondary | ICD-10-CM

## 2017-04-05 DIAGNOSIS — S51811A Laceration without foreign body of right forearm, initial encounter: Secondary | ICD-10-CM | POA: Insufficient documentation

## 2017-04-05 DIAGNOSIS — W19XXXA Unspecified fall, initial encounter: Secondary | ICD-10-CM | POA: Insufficient documentation

## 2017-04-05 IMAGING — CT CT CHEST W/O CM
2 of 4 series · 15 of 36 positions shown, 18 images · non-contrast
Comparison: Chest CT 09/10/2015

CLINICAL DATA: Followup pulmonary nodules.  History of emphysema.

EXAM:
CT CHEST WITHOUT CONTRAST
TECHNIQUE: Multidetector CT imaging of the chest was performed following the
standard protocol without IV contrast.

[Series 2: thorax · axial · 0.75mm/px · z∈[-125,+181]mm · 12 of 179 slices shown, 15 images]
[im 13/179  mediastinal]
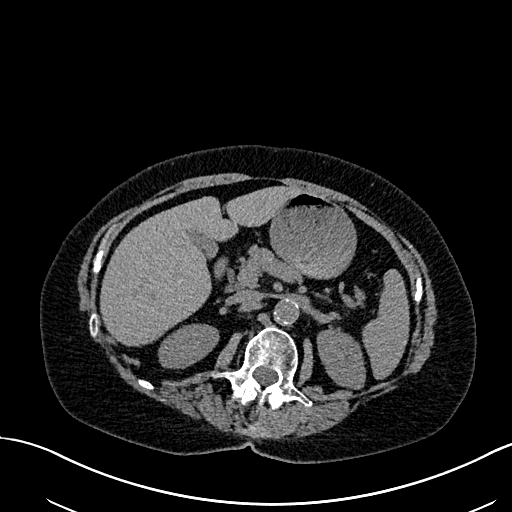
[im 13/179  lung]
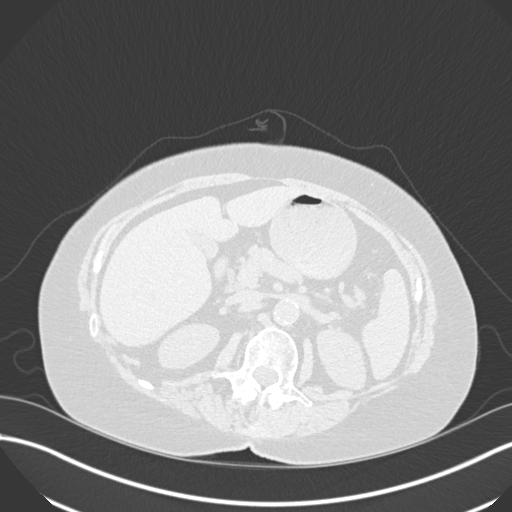
[im 26/179  lung]
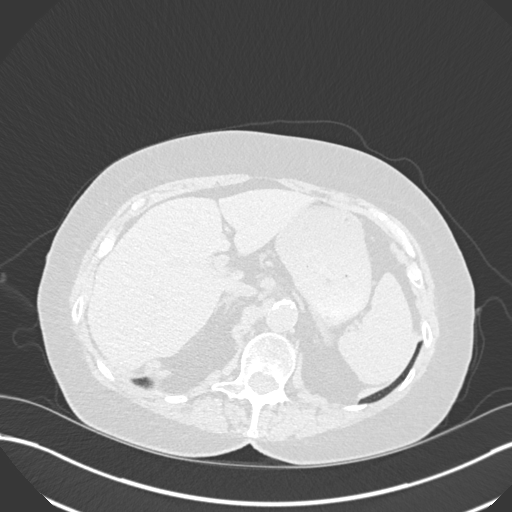
[im 39/179  lung]
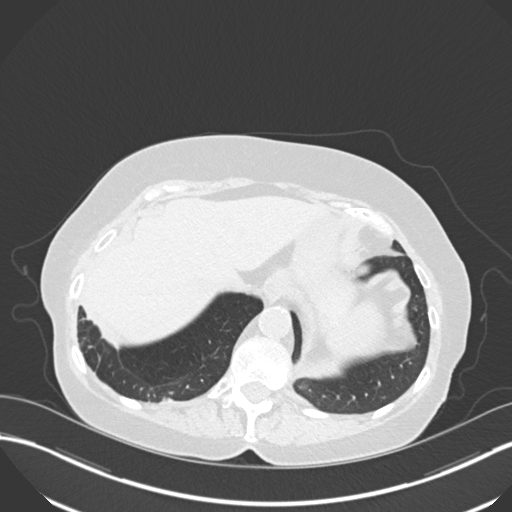
[im 51/179  lung]
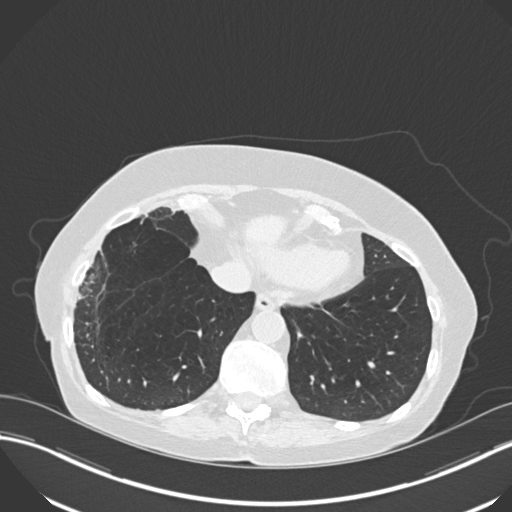
[im 64/179  mediastinal]
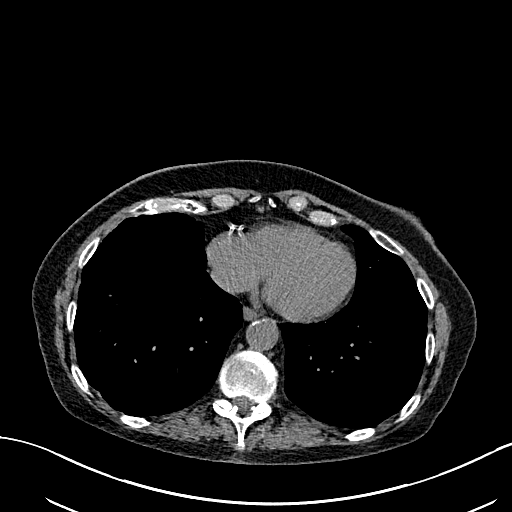
[im 64/179  lung]
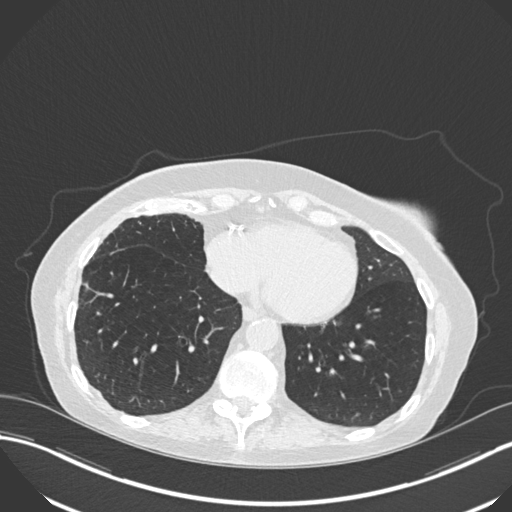
[im 77/179  lung]
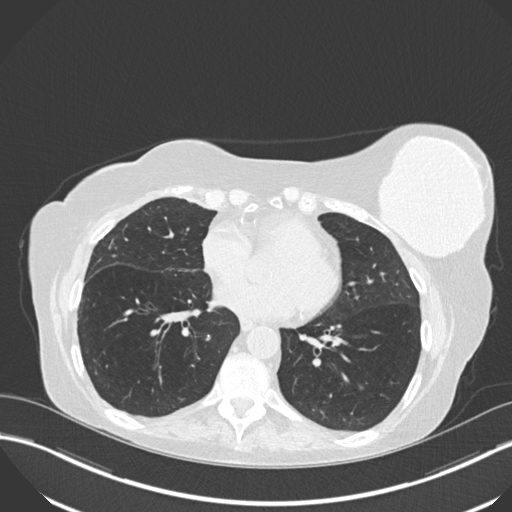
[im 102/179  lung]
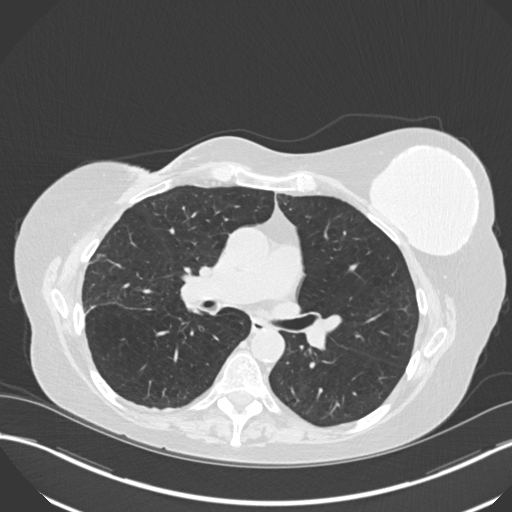
[im 115/179  lung]
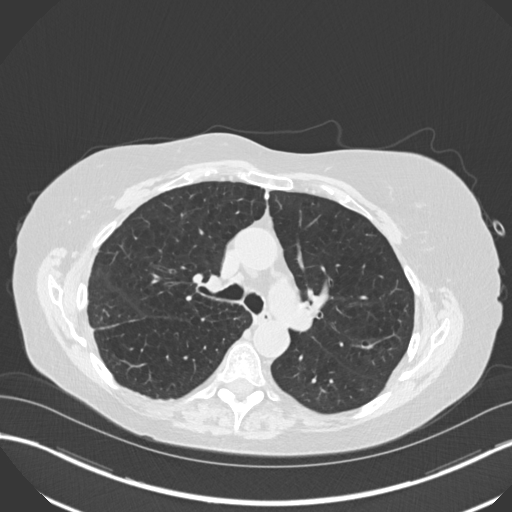
[im 128/179  mediastinal]
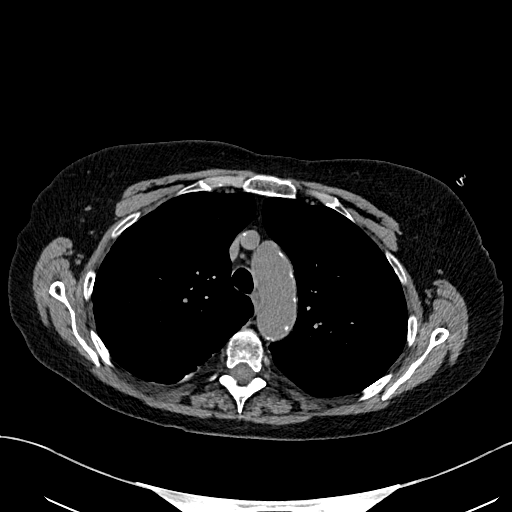
[im 128/179  lung]
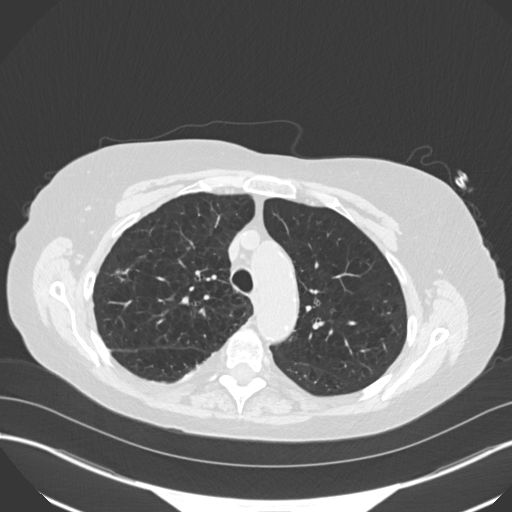
[im 140/179  lung]
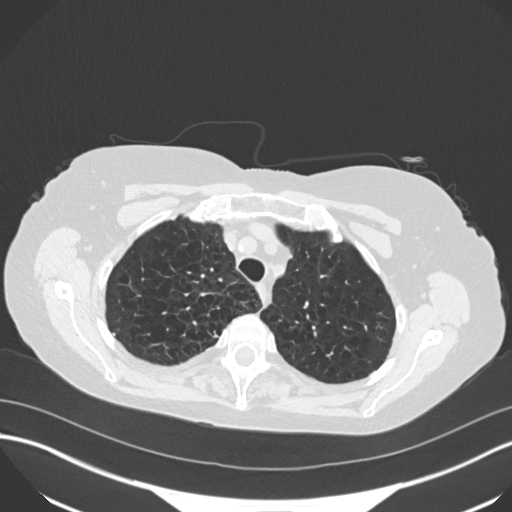
[im 153/179  lung]
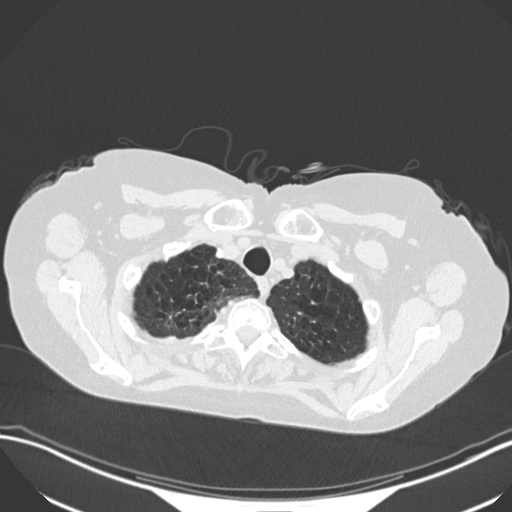
[im 166/179  lung]
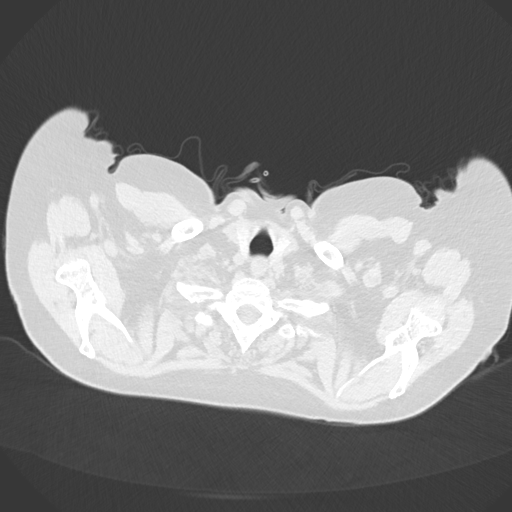

[Series 5: coronal · coronal · 0.70mm/px · 3 of 111 slices shown]
[im 23/111  lung]
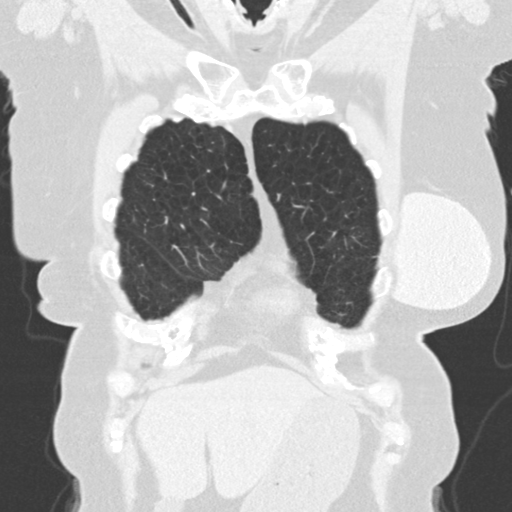
[im 45/111  lung]
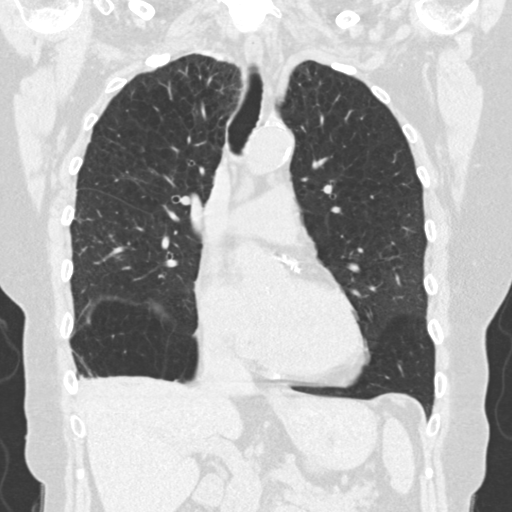
[im 67/111  lung]
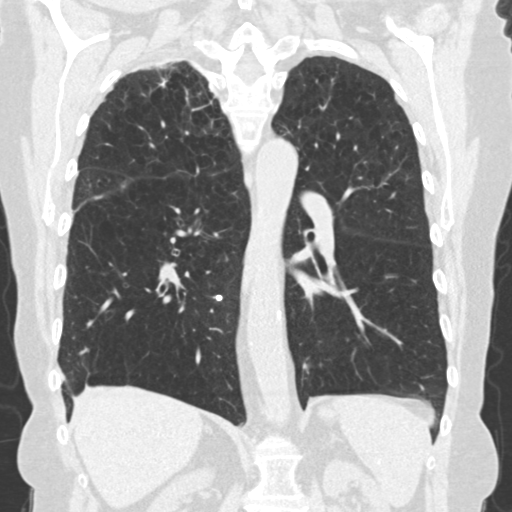

[15 of 36 positions shown; findings below may reference images not displayed]

FINDINGS: Chest wall: Stable postoperative changes involving the right breast
with a partial mastectomy and skin thickening and skin retraction.
No new masses identified. A left breast prosthesis is noted. No
supraclavicular or axillary lymphadenopathy.

Cardiovascular: The heart is normal in size and stable. No
pericardial effusion. The aorta is normal in caliber. Moderate
atherosclerotic calcifications. Stable three-vessel Coronary artery
calcifications.

Mediastinum/Nodes: No mediastinal or hilar mass or adenopathy. Small
scattered lymph nodes are stable. The esophagus is grossly normal.

Lungs/Pleura: Stable severe emphysematous changes and pulmonary
scarring.

Stable 7 mm right apical density, likely scar.

The left lower lobe density on image number 130 is smaller and has
the appearance of scar tissue.

Extensive right-sided pleural thickening, nodularity and
calcification likely due to remote pleural process such as infection
or hemorrhage.

8 mm pleural density on image number 36 in the right upper lobe is
stable.

Stable calcified granulomas.

New 4 mm nodule in the right upper lobe on image number 51.

Stable basilar scarring changes, right greater than left. No
infiltrate or effusion. No edema.

Upper Abdomen: Cholelithiasis. 9 mm left renal calculus with chronic
appearing inflammation of the left collecting system. Small
calcified granulomas in the spleen.

Musculoskeletal: No significant osseous findings. No acute bony
findings. Remote compression fracture of T8 is again noted.
IMPRESSION: 1. Stable postoperative changes involving the right breast. No
findings for recurrent tumor or adenopathy.
2. Stable advanced emphysematous changes and pulmonary scarring.
3. Stable 7 mm right apical density, likely scar.
4. Interval decrease in size of the linear density in the left lower
lobe, most likely scar.
5. New 4 mm right upper lobe pulmonary nodule possibly inflammatory.
Recommend followup noncontrast chest CT in 6 months.
6. Stable extensive pleural thickening and nodularity involving the
right hemi thorax.
7. Stable cholelithiasis and left renal calculus.

## 2017-04-05 MED ORDER — CYANOCOBALAMIN 1000 MCG/ML IJ SOLN
1000.0000 ug | Freq: Once | INTRAMUSCULAR | Status: AC
Start: 2017-04-05 — End: 2017-04-05
  Administered 2017-04-05: 1000 ug via INTRAMUSCULAR

## 2017-04-05 NOTE — Progress Notes (Signed)
BP 122/62 (BP Location: Left Arm, Patient Position: Sitting, Cuff Size: Normal)   Pulse 71   Temp 98.3 F (36.8 C) (Oral)   Wt 158 lb (71.7 kg)   SpO2 96% Comment: 3 L  BMI 24.75 kg/m    CC: hand laceration Subjective:    Patient ID: Mckenzie Mclaughlin, female    DOB: 10-10-1940, 77 y.o.   MRN: [id number]  HPI: Mckenzie Mclaughlin is a 77 y.o. female presenting on 04/05/2017 for Laceration (located on medial, posterior right hand. Fell at home in the bathroom 1/3/119. Not sure what she cut hand on. Last TD, 2007); Fall (Also, has pain in bilateral knees due to fall); and Injections (Requests vit B12 inj)   In wheelchair today. She uses oxygen regularly. Husband drove her here today, waits in waiting room. She has rolling walker at home but doesn't use.  DOI: 04/01/2017 Suffered fall at home in bathroom tub, hit bilateral knees and R forearm and R shoulder with large bruise anterior shoulder/chest. She lost her balance and endorses ongoing leg weakness.  She was seen at ER 1/5 - notes reviewed. Suffered large skin tear to R forearm. Treating wound with neosporin.   Interested in fall prevention program [place].  She has bath tub she uses, does have shower chair but no handle on the wall.   Relevant past medical, surgical, family and social history reviewed and updated as indicated. Interim medical history since our last visit reviewed. Allergies and medications reviewed and updated. Outpatient Medications Prior to Visit  Medication Sig Dispense Refill  . albuterol (PROAIR HFA) 108 (90 Base) MCG/ACT inhaler INHALE TWO PUFFS BY MOUTH EVERY 6 HOURS AS NEEDED 18 each 6  . ALPRAZolam (XANAX) 0.5 MG tablet TAKE 1 TABLET BY MOUTH 3 TIMES A DAY AS NEEDED 90 tablet 1  . aspirin EC 81 MG tablet Take 81 mg by mouth daily.     . budesonide-formoterol (SYMBICORT) 160-4.5 MCG/ACT inhaler Inhale 2 puffs into the lungs 2 (two) times daily. 1 Inhaler 5  . clopidogrel (PLAVIX) 75 MG tablet TAKE 1 TABLET BY MOUTH  ONCE A DAY 30 tablet 12  . diltiazem (CARDIZEM) 60 MG tablet Take 1 tablet (60 mg total) by mouth every 6 (six) hours.    . furosemide (LASIX) 40 MG tablet Take 1 tablet (40 mg total) by mouth daily. 30 tablet 0  . ipratropium-albuterol (DUONEB) 0.5-2.5 (3) MG/3ML SOLN Take 3 mLs by nebulization every 4 (four) hours as needed. 360 mL 0  . magnesium hydroxide (MILK OF MAGNESIA) 400 MG/5ML suspension Take 30 mLs by mouth daily. 360 mL 0  . metoprolol succinate (TOPROL-XL) 25 MG 24 hr tablet Take 25 mg by mouth daily.    . metoprolol succinate (TOPROL-XL) 50 MG 24 hr tablet TAKE 1 TABLET BY MOUTH ONCE A DAY WITH OR IMMEDIATELY FOLLOWING A MEAL 30 tablet 12  . nitroGLYCERIN (NITROSTAT) 0.4 MG SL tablet Place 1 tablet (0.4 mg total) under the tongue every 5 (five) minutes as needed for chest pain (max 3 doses in 15 min). 25 tablet 2  . OXYGEN Inhale 2-3 L into the lungs continuous.    . ranolazine (RANEXA) 500 MG 12 hr tablet Take 1 tablet (500 mg total) by mouth 2 (two) times daily. 60 tablet 0  . sulfamethoxazole-trimethoprim (BACTRIM DS,SEPTRA DS) 800-160 MG tablet Take 1 tablet by mouth 2 (two) times daily. 10 tablet 0  . tiotropium (SPIRIVA HANDIHALER) 18 MCG inhalation capsule INHALE ONE DOSE ONCE DAILY    .  venlafaxine XR (EFFEXOR-XR) 75 MG 24 hr capsule Take 75 mg by mouth daily at 10 pm.     Facility-Administered Medications Prior to Visit  Medication Dose Route Frequency Provider Last Rate Last Dose  . cyanocobalamin ((VITAMIN B-12)) injection 1,000 mcg  1,000 mcg Intramuscular Q30 days Tonia Ghent, MD   1,000 mcg at 09/10/16 1621     Per HPI unless specifically indicated in ROS section below Review of Systems     Objective:    BP 122/62 (BP Location: Left Arm, Patient Position: Sitting, Cuff Size: Normal)   Pulse 71   Temp 98.3 F (36.8 C) (Oral)   Wt 158 lb (71.7 kg)   SpO2 96% Comment: 3 L  BMI 24.75 kg/m   Wt Readings from Last 3 Encounters:  04/05/17 158 lb (71.7 kg)    04/03/17 155 lb (70.3 kg)  03/20/17 155 lb (70.3 kg)    Physical Exam  Constitutional: She appears well-developed and well-nourished. No distress.  Musculoskeletal: She exhibits no edema.  Bilateral knee pain with full extension but ROM preserved Swelling bilateral knees No popliteal fullness No pain with valgus/varus testing No pain with rotation of legs at hip  Skin: Skin is warm and dry. Bruising and ecchymosis noted. No rash noted.     Bruising throughout body after fall Skin tear longitudinally R forearm Skin tear crescent shaped R dorsal wrist No erythema or drainage  Psychiatric: She has a normal mood and affect.  Nursing note and vitals reviewed.  Forearm wound: R forearm laceration dressing changed today, pt tolerated well.    Assessment & Plan:   Problem List Items Addressed This Visit    B12 deficiency    B12 shot today.      Relevant Medications   cyanocobalamin ((VITAMIN B-12)) injection 1,000 mcg (Completed)   Fall in (into) shower or empty bathtub, initial encounter    Fall with injury. Pt is home bound due to fall risk. Interested in [facility] referral for further evaluation, fall prevention and home safety evaluation. She does have shower chair she uses.       Relevant Orders   Ambulatory referral to Home Health   Laceration of right forearm without complication - Primary    Healing well. Wound dressed with abx ointment and gauze. Home care reviewed.  Td updated.       Relevant Orders   Ambulatory referral to [facility]    Other Visit Diagnoses    Need for Td vaccine       Relevant Orders   Td vaccine greater than or equal to 7yo preservative free IM (Completed)       Follow up plan: Return if symptoms worsen or fail to improve.  Ria Bush, MD

## 2017-04-05 NOTE — Assessment & Plan Note (Signed)
B12 shot today. 

## 2017-04-05 NOTE — Assessment & Plan Note (Signed)
Healing well. Wound dressed with abx ointment and gauze. Home care reviewed.  Td updated.

## 2017-04-05 NOTE — Assessment & Plan Note (Addendum)
Fall with injury. Pt is home bound due to fall risk. Interested in [facility] referral for further evaluation, fall prevention and home safety evaluation. She does have shower chair she uses.

## 2017-04-05 NOTE — Patient Instructions (Addendum)
B12 shot today Td today Continue daily dressing changes as up to now. We will ask home health to come out to the house after recent fall.  Let us know right away of worsening pain, or any spreading redness around wound.  Nice to see you today, call us with questions.

## 2017-04-06 ENCOUNTER — Other Ambulatory Visit: Payer: Self-pay

## 2017-04-07 ENCOUNTER — Encounter: Payer: Self-pay | Admitting: Urology

## 2017-04-07 ENCOUNTER — Other Ambulatory Visit: Payer: Self-pay | Admitting: Radiology

## 2017-04-07 ENCOUNTER — Other Ambulatory Visit: Payer: Self-pay

## 2017-04-07 ENCOUNTER — Ambulatory Visit: Payer: Medicare Other | Admitting: Urology

## 2017-04-07 VITALS — BP 118/60 | HR 82 | Ht 67.0 in | Wt 155.0 lb

## 2017-04-07 DIAGNOSIS — N3001 Acute cystitis with hematuria: Secondary | ICD-10-CM | POA: Diagnosis not present

## 2017-04-07 DIAGNOSIS — N200 Calculus of kidney: Secondary | ICD-10-CM | POA: Diagnosis not present

## 2017-04-07 DIAGNOSIS — R310 Gross hematuria: Secondary | ICD-10-CM

## 2017-04-07 NOTE — Patient Outreach (Signed)
[facility]) Care Management  04/07/2017  NORETTA FRIER 1940-04-16 [id number]   Referral Date: 04-06-17 Referral Source: ED Census    Outreach Attempt: #1. Spoke with patient she is able to verify HIPAA.  Patient reports she is doing ok and that she saw the urologist today.  She reports that she has kidney stones and has some problems with hematuria.  Patient says that she will have a procedure at the end of the month for stent placement for her stones.    Social: Patient lives in the home with her spouse and he helps but is limited due to his health issues.  Patient reports she is able to do some things around the house and is independent with ADL's   Conditions: Patient has COPD and is oxygen dependent at 3 liters. Patient unfamiliar with COPD zones and feels she could know more in managing her COPD.  Patient also reports HTN but is controlled presently. Patient recently went to the hospital for a fall. Patient reports that is her first fall in over 2-3 years. Patient has followed up with her primary care physician this week after her fall. Patient to have home health for evaluation but has not heard from them presently.    Medications: Patient takes her medications as prescribed and is able to afford medications presently.  However, patient reports when she gets in the doughnut hole it is tough to afford medications.     Appointments: Patient saw primary doctor and urologist this week.    Advanced Directives: Patient does not have an advanced directive.   Consent: Discussed with patient [place] and is in agreement for health coach outreach.   Plan: RN CM will refer patient to health coach for disease management and support of COPD.     Jone Baseman, RN, MSN [facility] Care Management Care Management Coordinator Direct Line [phone] Toll Free: [phone]  Fax: [phone]

## 2017-04-07 NOTE — Progress Notes (Signed)
04/07/2017 10:26 AM   Mckenzie Mclaughlin 1940-04-25 [id number]  Referring provider: Tonia Ghent, MD [address] [place], [place] [address]  Chief Complaint  Patient presents with  . Nephrolithiasis    HPI: 77 year old female who returns to the office today following an emergency room visit for gross hematuria/UTI.  Gross hematuria Patient states she had her first episode of painless gross hematuria x single episode 01/2015.   Hematuria work up 12/16 including CT urogram (as below) and cystoscopy (negative)  Extensive smoking history, 1ppd x 45 years, quit 6-7 year ago.   Recurrent episode leading to emergency room visit last month.  Positive UA/urine culture growing pansensitive E. coli for which she was treated with Bactrim and her urine has since cleared.  Kidney stones No flank pain since last visit Never previously passed stones or required surgical intervention. Previously followed for an asymptomatic 8.8 mm left renal pelvis stone along with other much smaller non obstructing lower pole stones on CT Urogram 02/2015 --> repeat CT scan from 02/2017 shows marked increase in size of stone measuring 16 x 9 compared to previously measured 13 x 6 in 09/2015.  She also has associated hydronephrosis and perinephric and periureteral stranding. Multiple medical comorbidities including 02 depent/ CAD  Left renal pelvic abnormality Incidental urothelial enhancement and mild fullness of the left renal upper tract collecting system, suspect chronic inflammation from stone.  Urine cytology negative 02/2015 No flank pain Repeat CT urogram on 10/03/2015 shows persistent enhancement of the urothelium on the left --> appears to be stable  She has multiple medical comorbidities including severe COPD requiring oxygen, coronary artery disease on medical management including aspirin and Plavix, amongst others.  She continues to live at home with her husband take care of each  other.   PMH: Past Medical History:  Diagnosis Date  . Anginal pain [facility])    see dr [name]  "Sp[place]"  . Anxiety   . Arthritis   . Cancer (HCC)    side of head- skin cancer, squamous cell ca on left foot.  . Chronic airway obstruction (HCC)    chronic airway obstruction not elsewhere classified, unspecified  . Chronic obstructive asthma ([place])    unspecified  . Complication of anesthesia   . Constipation   . COPD (chronic obstructive pulmonary disease) ([place])   . COPD with emphysema ([place]) 01/05/2013  . COPD, severe 07/29/98  . Coronary artery disease 01/22/99   Non Q-wave MI, stent RCA  . Coronary atherosclerosis of native coronary artery 01/22/1999   status post stent RCA,   . Depression   . Dysrhythmia    Palpations at times. last time 10/29 ish 2014  . Esophageal dilatation    Pt needs appple sauce to take meds.  . Esophageal stricture    PT needs apple sauce to take meds  . GERD (gastroesophageal reflux disease) 08/29/98   severe-no meds now  . Head injury, acute, with loss of consciousness ([place]) 11/2012   unsure how long  . History of blood transfusion   . History of blurry vision 05/06-05/10/2009   Hospital [facility] CP R/O'D Blurry vision, smoking  . History of CT scan of head 08/02/09   w/o mild age appropr atrophy  . History of ETT 08/03/09   Myoview Normal  . History of ETT 05/1999   Cardiolite pos ETT, neg Cardiolite  . History of ETT 09/02/01   Normal  . History of ETT 04/28/2006   Myoview normal EF 83%  .  History of ETT 04/10/11   normal EF and no ischemia per Dr. Saralyn Pilar  . History of pneumothorax 01/05/2013  . Hx of cardiac catheterization 08/29/01   60% RCA 0/w 20-30% lesions  . Hx of cardiac catheterization 01/22/99   w/Stent 90% RCA lesion  . Hyperlipidemia, unspecified   . Hypertension    03/30/00  . Mental disorder   . On home oxygen therapy    as needed-has a travel tank  . Pneumothorax 2/14  . PONV (postoperative nausea and vomiting)     ad breast remocved and see was under anesthesia a long time  . Shortness of breath   . Status post aortic coarctation stent placement   . Tobacco abuse    1/4 pack daily    Surgical History: Past Surgical History:  Procedure Laterality Date  . BACK SURGERY    . BREAST SURGERY     implant removal, R breast  . CARDIAC CATHETERIZATION    . CAROTID STENT Right 2000  . CATARACT EXTRACTION Bilateral    2016  . COLONOSCOPY    . ESOPHAGEAL DILATION  06/00   EGD  . LAMINECTOMY  1976   Disc Removal X 2 Lumbar  . MASTECTOMY  03/1976   Bilateral due FCBD with implants [facility])  . MASTECTOMY Left    due to fibrocystic breast disease  . OOPHORECTOMY    . TISSUE EXPANDER PLACEMENT Right 02/03/2013   Procedure: REMOVAL OF RIGHT BREAST IMPLANT AND IMPLANT MATERIAL  CAPSULECTOMY;  Surgeon: Irene Limbo, MD;  Location: [place];  Service: Plastics;  Laterality: Right;  . TOTAL ABDOMINAL HYSTERECTOMY W/ BILATERAL SALPINGOOPHORECTOMY      Home Medications:  Allergies as of 04/07/2017      Reactions   Amoxicillin Shortness Of Breath   Doxycycline Nausea Only, Other (See Comments)   Dizzy Reaction:  Dizziness    Ibuprofen    Sertraline Hcl Nausea And Vomiting   REACTION: trembling      Medication List        Accurate as of 04/07/17 10:26 AM. Always use your most recent med list.          albuterol 108 (90 Base) MCG/ACT inhaler Commonly known as:  PROAIR HFA INHALE TWO PUFFS BY MOUTH EVERY 6 HOURS AS NEEDED   ALPRAZolam 0.5 MG tablet Commonly known as:  XANAX TAKE 1 TABLET BY MOUTH 3 TIMES A DAY AS NEEDED   aspirin EC 81 MG tablet Take 81 mg by mouth daily.   budesonide-formoterol 160-4.5 MCG/ACT inhaler Commonly known as:  SYMBICORT Inhale 2 puffs into the lungs 2 (two) times daily.   clopidogrel 75 MG tablet Commonly known as:  PLAVIX TAKE 1 TABLET BY MOUTH ONCE A DAY   diltiazem 60 MG tablet Commonly known as:  CARDIZEM Take 1 tablet (60 mg total) by mouth every 6  (six) hours.   furosemide 40 MG tablet Commonly known as:  LASIX Take 1 tablet (40 mg total) by mouth daily.   ipratropium-albuterol 0.5-2.5 (3) MG/3ML Soln Commonly known as:  DUONEB Take 3 mLs by nebulization every 4 (four) hours as needed.   magnesium hydroxide 400 MG/5ML suspension Commonly known as:  MILK OF MAGNESIA Take 30 mLs by mouth daily.   metoprolol succinate 25 MG 24 hr tablet Commonly known as:  TOPROL-XL Take 25 mg by mouth daily.   metoprolol succinate 50 MG 24 hr tablet Commonly known as:  TOPROL-XL TAKE 1 TABLET BY MOUTH ONCE A DAY WITH OR IMMEDIATELY FOLLOWING A  MEAL   nitroGLYCERIN 0.4 MG SL tablet Commonly known as:  NITROSTAT Place 1 tablet (0.4 mg total) under the tongue every 5 (five) minutes as needed for chest pain (max 3 doses in 15 min).   OXYGEN Inhale 2-3 L into the lungs continuous.   ranolazine 500 MG 12 hr tablet Commonly known as:  RANEXA Take 1 tablet (500 mg total) by mouth 2 (two) times daily.   SPIRIVA HANDIHALER 18 MCG inhalation capsule Generic drug:  tiotropium INHALE ONE DOSE ONCE DAILY   sulfamethoxazole-trimethoprim 800-160 MG tablet Commonly known as:  BACTRIM DS,SEPTRA DS Take 1 tablet by mouth 2 (two) times daily.   venlafaxine XR 75 MG 24 hr capsule Commonly known as:  EFFEXOR-XR Take 75 mg by mouth daily at 10 pm.       Allergies:  Allergies  Allergen Reactions  . Amoxicillin Shortness Of Breath  . Doxycycline Nausea Only and Other (See Comments)    Dizzy Reaction:  Dizziness   . Ibuprofen   . Sertraline Hcl Nausea And Vomiting    REACTION: trembling    Family History: Family History  Problem Relation Age of Onset  . Stroke Mother   . Heart disease Mother        MI  . Heart failure Mother   . Cancer Father        Lung  . Lung cancer Father   . COPD Brother   . Lung cancer Brother   . Heart disease Brother        CAD  . Heart disease Sister        cirrhosis due heart disease  . Cirrhosis  Sister   . Coronary artery disease Sister   . Colon cancer Neg Hx   . Breast cancer Neg Hx     Social History:  reports that she quit smoking about 6 years ago. Her smoking use included cigarettes. She has a 79.50 pack-year smoking history. she has never used smokeless tobacco. She reports that she does not drink alcohol or use drugs.  ROS: UROLOGY Frequent Urination?: No Hard to postpone urination?: Yes Burning/pain with urination?: No Get up at night to urinate?: Yes Leakage of urine?: No Urine stream starts and stops?: Yes Trouble starting stream?: Yes Do you have to strain to urinate?: Yes Blood in urine?: Yes Urinary tract infection?: No Sexually transmitted disease?: No Injury to kidneys or bladder?: No Painful intercourse?: No Weak stream?: Yes Currently pregnant?: No Vaginal bleeding?: Yes Last menstrual period?: n  Gastrointestinal Nausea?: Yes Vomiting?: No Indigestion/heartburn?: No Diarrhea?: No Constipation?: No  Constitutional Fever: No Night sweats?: No Weight loss?: No Fatigue?: No  Skin Skin rash/lesions?: No Itching?: No  Eyes Blurred vision?: No Double vision?: No  Ears/Nose/Throat Sore throat?: No Sinus problems?: No  Hematologic/Lymphatic Swollen glands?: No Easy bruising?: No  Cardiovascular Leg swelling?: No Chest pain?: No  Respiratory Cough?: No Shortness of breath?: Yes  Endocrine Excessive thirst?: No  Musculoskeletal Back pain?: No Joint pain?: No  Neurological Headaches?: No Dizziness?: No  Psychologic Depression?: Yes Anxiety?: Yes  Physical Exam: BP 118/60   Pulse 82   Ht 5\' 7"  (1.702 m)   Wt 155 lb (70.3 kg)   BMI 24.28 kg/m   Constitutional:  Alert and oriented, No acute distress.  In a wheelchair, frail appearing.  Appears older than stated age. HEENT: [place] AT, moist mucus membranes.  Trachea midline, no masses. Cardiovascular: No clubbing, cyanosis, or edema. Respiratory: Wearing oxygen, no  increased work of  breathing. GI: Abdomen is soft, nontender, nondistended, no abdominal masses GU: No CVA tenderness.  Skin: No rashes, bruises or suspicious lesions. Neurologic: Grossly intact, no focal deficits, moving all 4 extremities. Psychiatric: Normal mood and affect.  Laboratory Data: Lab Results  Component Value Date   WBC 11.1 (H) 03/20/2017   HGB 13.1 03/20/2017   HCT 40.0 03/20/2017   MCV 89.5 03/20/2017   PLT 272 03/20/2017    Lab Results  Component Value Date   CREATININE 0.97 03/20/2017    Lab Results  Component Value Date   HGBA1C 5.2 04/21/2011    Urinalysis UA reviewed from 02/2017, Unable to provide sample today  Pertinent Imaging: Results for orders placed during the hospital encounter of 03/20/17  CT Renal Stone Study   Narrative CLINICAL DATA:  Hematuria  EXAM: CT ABDOMEN AND PELVIS WITHOUT CONTRAST  TECHNIQUE: Multidetector CT imaging of the abdomen and pelvis was performed following the standard protocol without IV contrast.  COMPARISON:  Abdominal CT 10/02/2013  FINDINGS: Lower chest: No pulmonary nodules or pleural effusion. No visible pericardial effusion.  Hepatobiliary: Normal hepatic contours and density. No visible biliary dilatation. Normal gallbladder.  Pancreas: Normal contours without ductal dilatation. No peripancreatic fluid collection.  Spleen: Normal.  Adrenals/Urinary Tract:  --Adrenal glands: Normal.  --Right kidney/ureter: No hydronephrosis or perinephric stranding. No nephrolithiasis. No obstructing ureteral stones.  --Left kidney/ureter: There is an obstructing stone with the left renal pelvis, measuring 16 x 9 mm. This stone is markedly enlarged compared to the prior study, where it measured approximately 13 x 6 mm. There are multiple smaller stones at the lower pole left kidney. There is moderate hydronephrosis with perinephric and periureteral stranding. There is no stone in the ureter.  --Urinary  bladder: Unremarkable.  Stomach/Bowel:  --Stomach/Duodenum: No hiatal hernia or other gastric abnormality. Normal duodenal course and caliber.  --Small bowel: No dilatation or inflammation.  --Colon: No focal abnormality.  --Appendix: Normal.  Vascular/Lymphatic: Atherosclerotic calcification is present within the non-aneurysmal abdominal aorta, without hemodynamically significant stenosis. No abdominal or pelvic lymphadenopathy.  Reproductive: Status post hysterectomy. No adnexal mass.  Musculoskeletal. No bony spinal canal stenosis or focal osseous abnormality.  Other: None.  IMPRESSION: 1. Increased size of left renal pelvis stone, now measuring 16 x 9 mm and causing moderate left hydronephrosis with perinephric and periureteral stranding. No stone in the ureter. 2. Multiple smaller left renal stones at the lower pole. 3.  Aortic Atherosclerosis (ICD10-I70.0).   Electronically Signed   By: Ulyses Jarred M.D.   On: 03/21/2017 00:08    CT scan personally reviewed today with the patient.  Assessment & Plan:    1. Nephrolithiasis Enlarging left renal pelvic stone now measuring 16 mm x 9 mm with worsening perinephric inflammation likely partial intermittent obstruction We had a lengthy and honest discussion today about how to manage the stone.  Other than her recent UTI and gross hematuria, she is otherwise asymptomatic.  Based on the size of the stone, given its fairly significant interval growth, and concern for impending obstruction/infection, I would more strongly recommend treatment for the stone.  Unfortunately, she continues to have multiple medical comorbidities which may limit our ability to treat.  She will likely need to remain on some degree of antiplatelet therapy thus percutaneous nephrolithotomy and ESWL is not a reasonable intervention.   We did discuss the possibility of staged left ureteroscopy in detail.  She could stay on aspirin therapy for this  intervention.Risks and benefits of ureteroscopy were reviewed  including but not limited to infection, bleeding, pain, ureteral injury which could require open surgery versus prolonged indwelling if ureteralperforation occurs, persistent stone disease, requirement for staged procedure, possible stent, and global anesthesia risks. Patient expressed understanding and desires to proceed with ureteroscopy.  We will arrange for her to be seen by her pulmonologist, Dr. Raul Del as well as her cardiologist, Dr. Drema Dallas prior to surgery to optimization/clearance.  2. Gross hematuria Likely secondary to #1 Will evaluate bladder, upper tracts at the time of ureteroscopy  3. Acute cystitis with hematuria Recently treated for E. coli urinary tract infection We will repeat UA/urine culture prior to treatment Cover with double antibiotics at the time the procedure Still may be colonized with bacteria which increases risk for sepsis which was discussed with the patient   Hollice Espy, MD  [facility] [address] [place], [place] [address] [phone]  I spent 25 min with this patient of which greater than 50% was spent in counseling and coordination of care with the patient.

## 2017-04-07 NOTE — H&P (View-Only) (Signed)
04/07/2017 10:26 AM   Mckenzie Mclaughlin 1940/06/25 [id number]  Referring provider: Tonia Ghent, MD [address] [place], [place] [address]  Chief Complaint  Patient presents with  . Nephrolithiasis    HPI: 77 year old female who returns to the office today following an emergency room visit for gross hematuria/UTI.  Gross hematuria Patient states she had her first episode of painless gross hematuria x single episode 01/2015.   Hematuria work up 12/16 including CT urogram (as below) and cystoscopy (negative)  Extensive smoking history, 1ppd x 45 years, quit 6-7 year ago.   Recurrent episode leading to emergency room visit last month.  Positive UA/urine culture growing pansensitive E. coli for which she was treated with Bactrim and her urine has since cleared.  Kidney stones No flank pain since last visit Never previously passed stones or required surgical intervention. Previously followed for an asymptomatic 8.8 mm left renal pelvis stone along with other much smaller non obstructing lower pole stones on CT Urogram 02/2015 --> repeat CT scan from 02/2017 shows marked increase in size of stone measuring 16 x 9 compared to previously measured 13 x 6 in 09/2015.  She also has associated hydronephrosis and perinephric and periureteral stranding. Multiple medical comorbidities including 02 depent/ CAD  Left renal pelvic abnormality Incidental urothelial enhancement and mild fullness of the left renal upper tract collecting system, suspect chronic inflammation from stone.  Urine cytology negative 02/2015 No flank pain Repeat CT urogram on 10/03/2015 shows persistent enhancement of the urothelium on the left --> appears to be stable  She has multiple medical comorbidities including severe COPD requiring oxygen, coronary artery disease on medical management including aspirin and Plavix, amongst others.  She continues to live at home with her husband take care of each  other.   PMH: Past Medical History:  Diagnosis Date  . Anginal pain [facility])    see dr [name]  "Spams"  . Anxiety   . Arthritis   . Cancer (HCC)    side of head- skin cancer, squamous cell ca on left foot.  . Chronic airway obstruction (HCC)    chronic airway obstruction not elsewhere classified, unspecified  . Chronic obstructive asthma ([place])    unspecified  . Complication of anesthesia   . Constipation   . COPD (chronic obstructive pulmonary disease) ([place])   . COPD with emphysema ([place]) 01/05/2013  . COPD, severe 07/29/98  . Coronary artery disease 01/22/99   Non Q-wave MI, stent RCA  . Coronary atherosclerosis of native coronary artery 01/22/1999   status post stent RCA,   . Depression   . Dysrhythmia    Palpations at times. last time 10/29 ish 2014  . Esophageal dilatation    Pt needs appple sauce to take meds.  . Esophageal stricture    PT needs apple sauce to take meds  . GERD (gastroesophageal reflux disease) 08/29/98   severe-no meds now  . Head injury, acute, with loss of consciousness ([place]) 11/2012   unsure how long  . History of blood transfusion   . History of blurry vision 05/06-05/10/2009   Hospital [facility] CP R/O'D Blurry vision, smoking  . History of CT scan of head 08/02/09   w/o mild age appropr atrophy  . History of ETT 08/03/09   Myoview Normal  . History of ETT 05/1999   Cardiolite pos ETT, neg Cardiolite  . History of ETT 09/02/01   Normal  . History of ETT 04/28/2006   Myoview normal EF 83%  .  History of ETT 04/10/11   normal EF and no ischemia per Dr. Saralyn Pilar  . History of pneumothorax 01/05/2013  . Hx of cardiac catheterization 08/29/01   60% RCA 0/w 20-30% lesions  . Hx of cardiac catheterization 01/22/99   w/Stent 90% RCA lesion  . Hyperlipidemia, unspecified   . Hypertension    03/30/00  . Mental disorder   . On home oxygen therapy    as needed-has a travel tank  . Pneumothorax 2/14  . PONV (postoperative nausea and vomiting)     ad breast remocved and see was under anesthesia a long time  . Shortness of breath   . Status post aortic coarctation stent placement   . Tobacco abuse    1/4 pack daily    Surgical History: Past Surgical History:  Procedure Laterality Date  . BACK SURGERY    . BREAST SURGERY     implant removal, R breast  . CARDIAC CATHETERIZATION    . CAROTID STENT Right 2000  . CATARACT EXTRACTION Bilateral    2016  . COLONOSCOPY    . ESOPHAGEAL DILATION  06/00   EGD  . LAMINECTOMY  1976   Disc Removal X 2 Lumbar  . MASTECTOMY  03/1976   Bilateral due FCBD with implants [facility])  . MASTECTOMY Left    due to fibrocystic breast disease  . OOPHORECTOMY    . TISSUE EXPANDER PLACEMENT Right 02/03/2013   Procedure: REMOVAL OF RIGHT BREAST IMPLANT AND IMPLANT MATERIAL  CAPSULECTOMY;  Surgeon: Irene Limbo, MD;  Location: [facility];  Service: Plastics;  Laterality: Right;  . TOTAL ABDOMINAL HYSTERECTOMY W/ BILATERAL SALPINGOOPHORECTOMY      Home Medications:  Allergies as of 04/07/2017      Reactions   Amoxicillin Shortness Of Breath   Doxycycline Nausea Only, Other (See Comments)   Dizzy Reaction:  Dizziness    Ibuprofen    Sertraline Hcl Nausea And Vomiting   REACTION: trembling      Medication List        Accurate as of 04/07/17 10:26 AM. Always use your most recent med list.          albuterol 108 (90 Base) MCG/ACT inhaler Commonly known as:  PROAIR HFA INHALE TWO PUFFS BY MOUTH EVERY 6 HOURS AS NEEDED   ALPRAZolam 0.5 MG tablet Commonly known as:  XANAX TAKE 1 TABLET BY MOUTH 3 TIMES A DAY AS NEEDED   aspirin EC 81 MG tablet Take 81 mg by mouth daily.   budesonide-formoterol 160-4.5 MCG/ACT inhaler Commonly known as:  SYMBICORT Inhale 2 puffs into the lungs 2 (two) times daily.   clopidogrel 75 MG tablet Commonly known as:  PLAVIX TAKE 1 TABLET BY MOUTH ONCE A DAY   diltiazem 60 MG tablet Commonly known as:  CARDIZEM Take 1 tablet (60 mg total) by mouth every 6  (six) hours.   furosemide 40 MG tablet Commonly known as:  LASIX Take 1 tablet (40 mg total) by mouth daily.   ipratropium-albuterol 0.5-2.5 (3) MG/3ML Soln Commonly known as:  DUONEB Take 3 mLs by nebulization every 4 (four) hours as needed.   magnesium hydroxide 400 MG/5ML suspension Commonly known as:  MILK OF MAGNESIA Take 30 mLs by mouth daily.   metoprolol succinate 25 MG 24 hr tablet Commonly known as:  TOPROL-XL Take 25 mg by mouth daily.   metoprolol succinate 50 MG 24 hr tablet Commonly known as:  TOPROL-XL TAKE 1 TABLET BY MOUTH ONCE A DAY WITH OR IMMEDIATELY FOLLOWING A  MEAL   nitroGLYCERIN 0.4 MG SL tablet Commonly known as:  NITROSTAT Place 1 tablet (0.4 mg total) under the tongue every 5 (five) minutes as needed for chest pain (max 3 doses in 15 min).   OXYGEN Inhale 2-3 L into the lungs continuous.   ranolazine 500 MG 12 hr tablet Commonly known as:  RANEXA Take 1 tablet (500 mg total) by mouth 2 (two) times daily.   SPIRIVA HANDIHALER 18 MCG inhalation capsule Generic drug:  tiotropium INHALE ONE DOSE ONCE DAILY   sulfamethoxazole-trimethoprim 800-160 MG tablet Commonly known as:  BACTRIM DS,SEPTRA DS Take 1 tablet by mouth 2 (two) times daily.   venlafaxine XR 75 MG 24 hr capsule Commonly known as:  EFFEXOR-XR Take 75 mg by mouth daily at 10 pm.       Allergies:  Allergies  Allergen Reactions  . Amoxicillin Shortness Of Breath  . Doxycycline Nausea Only and Other (See Comments)    Dizzy Reaction:  Dizziness   . Ibuprofen   . Sertraline Hcl Nausea And Vomiting    REACTION: trembling    Family History: Family History  Problem Relation Age of Onset  . Stroke Mother   . Heart disease Mother        MI  . Heart failure Mother   . Cancer Father        Lung  . Lung cancer Father   . COPD Brother   . Lung cancer Brother   . Heart disease Brother        CAD  . Heart disease Sister        cirrhosis due heart disease  . Cirrhosis  Sister   . Coronary artery disease Sister   . Colon cancer Neg Hx   . Breast cancer Neg Hx     Social History:  reports that she quit smoking about 6 years ago. Her smoking use included cigarettes. She has a 79.50 pack-year smoking history. she has never used smokeless tobacco. She reports that she does not drink alcohol or use drugs.  ROS: UROLOGY Frequent Urination?: No Hard to postpone urination?: Yes Burning/pain with urination?: No Get up at night to urinate?: Yes Leakage of urine?: No Urine stream starts and stops?: Yes Trouble starting stream?: Yes Do you have to strain to urinate?: Yes Blood in urine?: Yes Urinary tract infection?: No Sexually transmitted disease?: No Injury to kidneys or bladder?: No Painful intercourse?: No Weak stream?: Yes Currently pregnant?: No Vaginal bleeding?: Yes Last menstrual period?: n  Gastrointestinal Nausea?: Yes Vomiting?: No Indigestion/heartburn?: No Diarrhea?: No Constipation?: No  Constitutional Fever: No Night sweats?: No Weight loss?: No Fatigue?: No  Skin Skin rash/lesions?: No Itching?: No  Eyes Blurred vision?: No Double vision?: No  Ears/Nose/Throat Sore throat?: No Sinus problems?: No  Hematologic/Lymphatic Swollen glands?: No Easy bruising?: No  Cardiovascular Leg swelling?: No Chest pain?: No  Respiratory Cough?: No Shortness of breath?: Yes  Endocrine Excessive thirst?: No  Musculoskeletal Back pain?: No Joint pain?: No  Neurological Headaches?: No Dizziness?: No  Psychologic Depression?: Yes Anxiety?: Yes  Physical Exam: BP 118/60   Pulse 82   Ht 5\' 7"  (1.702 m)   Wt 155 lb (70.3 kg)   BMI 24.28 kg/m   Constitutional:  Alert and oriented, No acute distress.  In a wheelchair, frail appearing.  Appears older than stated age. HEENT: [place] AT, moist mucus membranes.  Trachea midline, no masses. Cardiovascular: No clubbing, cyanosis, or edema. Respiratory: Wearing oxygen, no  increased work of  breathing. GI: Abdomen is soft, nontender, nondistended, no abdominal masses GU: No CVA tenderness.  Skin: No rashes, bruises or suspicious lesions. Neurologic: Grossly intact, no focal deficits, moving all 4 extremities. Psychiatric: Normal mood and affect.  Laboratory Data: Lab Results  Component Value Date   WBC 11.1 (H) 03/20/2017   HGB 13.1 03/20/2017   HCT 40.0 03/20/2017   MCV 89.5 03/20/2017   PLT 272 03/20/2017    Lab Results  Component Value Date   CREATININE 0.97 03/20/2017    Lab Results  Component Value Date   HGBA1C 5.2 04/21/2011    Urinalysis UA reviewed from 02/2017, Unable to provide sample today  Pertinent Imaging: Results for orders placed during the hospital encounter of 03/20/17  CT Renal Stone Study   Narrative CLINICAL DATA:  Hematuria  EXAM: CT ABDOMEN AND PELVIS WITHOUT CONTRAST  TECHNIQUE: Multidetector CT imaging of the abdomen and pelvis was performed following the standard protocol without IV contrast.  COMPARISON:  Abdominal CT 10/02/2013  FINDINGS: Lower chest: No pulmonary nodules or pleural effusion. No visible pericardial effusion.  Hepatobiliary: Normal hepatic contours and density. No visible biliary dilatation. Normal gallbladder.  Pancreas: Normal contours without ductal dilatation. No peripancreatic fluid collection.  Spleen: Normal.  Adrenals/Urinary Tract:  --Adrenal glands: Normal.  --Right kidney/ureter: No hydronephrosis or perinephric stranding. No nephrolithiasis. No obstructing ureteral stones.  --Left kidney/ureter: There is an obstructing stone with the left renal pelvis, measuring 16 x 9 mm. This stone is markedly enlarged compared to the prior study, where it measured approximately 13 x 6 mm. There are multiple smaller stones at the lower pole left kidney. There is moderate hydronephrosis with perinephric and periureteral stranding. There is no stone in the ureter.  --Urinary  bladder: Unremarkable.  Stomach/Bowel:  --Stomach/Duodenum: No hiatal hernia or other gastric abnormality. Normal duodenal course and caliber.  --Small bowel: No dilatation or inflammation.  --Colon: No focal abnormality.  --Appendix: Normal.  Vascular/Lymphatic: Atherosclerotic calcification is present within the non-aneurysmal abdominal aorta, without hemodynamically significant stenosis. No abdominal or pelvic lymphadenopathy.  Reproductive: Status post hysterectomy. No adnexal mass.  Musculoskeletal. No bony spinal canal stenosis or focal osseous abnormality.  Other: None.  IMPRESSION: 1. Increased size of left renal pelvis stone, now measuring 16 x 9 mm and causing moderate left hydronephrosis with perinephric and periureteral stranding. No stone in the ureter. 2. Multiple smaller left renal stones at the lower pole. 3.  Aortic Atherosclerosis (ICD10-I70.0).   Electronically Signed   By: Ulyses Jarred M.D.   On: 03/21/2017 00:08    CT scan personally reviewed today with the patient.  Assessment & Plan:    1. Nephrolithiasis Enlarging left renal pelvic stone now measuring 16 mm x 9 mm with worsening perinephric inflammation likely partial intermittent obstruction We had a lengthy and honest discussion today about how to manage the stone.  Other than her recent UTI and gross hematuria, she is otherwise asymptomatic.  Based on the size of the stone, given its fairly significant interval growth, and concern for impending obstruction/infection, I would more strongly recommend treatment for the stone.  Unfortunately, she continues to have multiple medical comorbidities which may limit our ability to treat.  She will likely need to remain on some degree of antiplatelet therapy thus percutaneous nephrolithotomy and ESWL is not a reasonable intervention.   We did discuss the possibility of staged left ureteroscopy in detail.  She could stay on aspirin therapy for this  intervention.Risks and benefits of ureteroscopy were reviewed  including but not limited to infection, bleeding, pain, ureteral injury which could require open surgery versus prolonged indwelling if ureteralperforation occurs, persistent stone disease, requirement for staged procedure, possible stent, and global anesthesia risks. Patient expressed understanding and desires to proceed with ureteroscopy.  We will arrange for her to be seen by her pulmonologist, Dr. Raul Del as well as her cardiologist, Dr. Drema Dallas prior to surgery to optimization/clearance.  2. Gross hematuria Likely secondary to #1 Will evaluate bladder, upper tracts at the time of ureteroscopy  3. Acute cystitis with hematuria Recently treated for E. coli urinary tract infection We will repeat UA/urine culture prior to treatment Cover with double antibiotics at the time the procedure Still may be colonized with bacteria which increases risk for sepsis which was discussed with the patient   Hollice Espy, MD  [facility] [address], [place], [place] [address] [phone]  I spent 25 min with this patient of which greater than 50% was spent in counseling and coordination of care with the patient.

## 2017-04-08 ENCOUNTER — Other Ambulatory Visit: Payer: Self-pay | Admitting: Radiology

## 2017-04-08 ENCOUNTER — Encounter: Payer: Self-pay | Admitting: *Deleted

## 2017-04-08 DIAGNOSIS — N200 Calculus of kidney: Secondary | ICD-10-CM

## 2017-04-09 ENCOUNTER — Telehealth: Payer: Self-pay | Admitting: *Deleted

## 2017-04-09 DIAGNOSIS — I2510 Atherosclerotic heart disease of native coronary artery without angina pectoris: Secondary | ICD-10-CM | POA: Diagnosis not present

## 2017-04-09 DIAGNOSIS — I10 Essential (primary) hypertension: Secondary | ICD-10-CM | POA: Diagnosis not present

## 2017-04-09 DIAGNOSIS — M810 Age-related osteoporosis without current pathological fracture: Secondary | ICD-10-CM | POA: Diagnosis not present

## 2017-04-09 DIAGNOSIS — Z9981 Dependence on supplemental oxygen: Secondary | ICD-10-CM | POA: Diagnosis not present

## 2017-04-09 DIAGNOSIS — Z7902 Long term (current) use of antithrombotics/antiplatelets: Secondary | ICD-10-CM | POA: Diagnosis not present

## 2017-04-09 DIAGNOSIS — E785 Hyperlipidemia, unspecified: Secondary | ICD-10-CM | POA: Diagnosis not present

## 2017-04-09 DIAGNOSIS — E538 Deficiency of other specified B group vitamins: Secondary | ICD-10-CM | POA: Diagnosis not present

## 2017-04-09 DIAGNOSIS — Z7982 Long term (current) use of aspirin: Secondary | ICD-10-CM | POA: Diagnosis not present

## 2017-04-09 DIAGNOSIS — I252 Old myocardial infarction: Secondary | ICD-10-CM | POA: Diagnosis not present

## 2017-04-09 DIAGNOSIS — K219 Gastro-esophageal reflux disease without esophagitis: Secondary | ICD-10-CM | POA: Diagnosis not present

## 2017-04-09 DIAGNOSIS — J439 Emphysema, unspecified: Secondary | ICD-10-CM | POA: Diagnosis not present

## 2017-04-09 NOTE — Telephone Encounter (Signed)
Copied from [place] [phone]. Topic: General - Other >> Apr 09, 2017  1:40 PM Synthia Innocent wrote: Reason for CRM: Needing verbal orders for PT 2x a week for 8 weeks, eval for OT

## 2017-04-11 NOTE — Telephone Encounter (Signed)
Please give the order.  Thanks.   

## 2017-04-12 NOTE — Telephone Encounter (Addendum)
Kristin with [facility] advised.

## 2017-04-13 DIAGNOSIS — I10 Essential (primary) hypertension: Secondary | ICD-10-CM | POA: Diagnosis not present

## 2017-04-13 DIAGNOSIS — I2510 Atherosclerotic heart disease of native coronary artery without angina pectoris: Secondary | ICD-10-CM | POA: Diagnosis not present

## 2017-04-13 DIAGNOSIS — I252 Old myocardial infarction: Secondary | ICD-10-CM | POA: Diagnosis not present

## 2017-04-13 DIAGNOSIS — E538 Deficiency of other specified B group vitamins: Secondary | ICD-10-CM | POA: Diagnosis not present

## 2017-04-13 DIAGNOSIS — E785 Hyperlipidemia, unspecified: Secondary | ICD-10-CM | POA: Diagnosis not present

## 2017-04-13 DIAGNOSIS — K219 Gastro-esophageal reflux disease without esophagitis: Secondary | ICD-10-CM | POA: Diagnosis not present

## 2017-04-13 DIAGNOSIS — Z7902 Long term (current) use of antithrombotics/antiplatelets: Secondary | ICD-10-CM | POA: Diagnosis not present

## 2017-04-13 DIAGNOSIS — Z9981 Dependence on supplemental oxygen: Secondary | ICD-10-CM | POA: Diagnosis not present

## 2017-04-13 DIAGNOSIS — J439 Emphysema, unspecified: Secondary | ICD-10-CM | POA: Diagnosis not present

## 2017-04-13 DIAGNOSIS — Z7982 Long term (current) use of aspirin: Secondary | ICD-10-CM | POA: Diagnosis not present

## 2017-04-13 DIAGNOSIS — M810 Age-related osteoporosis without current pathological fracture: Secondary | ICD-10-CM | POA: Diagnosis not present

## 2017-04-14 DIAGNOSIS — E785 Hyperlipidemia, unspecified: Secondary | ICD-10-CM | POA: Diagnosis not present

## 2017-04-14 DIAGNOSIS — Z9981 Dependence on supplemental oxygen: Secondary | ICD-10-CM | POA: Diagnosis not present

## 2017-04-14 DIAGNOSIS — Z7982 Long term (current) use of aspirin: Secondary | ICD-10-CM | POA: Diagnosis not present

## 2017-04-14 DIAGNOSIS — E538 Deficiency of other specified B group vitamins: Secondary | ICD-10-CM | POA: Diagnosis not present

## 2017-04-14 DIAGNOSIS — I2510 Atherosclerotic heart disease of native coronary artery without angina pectoris: Secondary | ICD-10-CM | POA: Diagnosis not present

## 2017-04-14 DIAGNOSIS — M810 Age-related osteoporosis without current pathological fracture: Secondary | ICD-10-CM | POA: Diagnosis not present

## 2017-04-14 DIAGNOSIS — Z7902 Long term (current) use of antithrombotics/antiplatelets: Secondary | ICD-10-CM | POA: Diagnosis not present

## 2017-04-14 DIAGNOSIS — K219 Gastro-esophageal reflux disease without esophagitis: Secondary | ICD-10-CM | POA: Diagnosis not present

## 2017-04-14 DIAGNOSIS — I252 Old myocardial infarction: Secondary | ICD-10-CM | POA: Diagnosis not present

## 2017-04-14 DIAGNOSIS — J439 Emphysema, unspecified: Secondary | ICD-10-CM | POA: Diagnosis not present

## 2017-04-14 DIAGNOSIS — I10 Essential (primary) hypertension: Secondary | ICD-10-CM | POA: Diagnosis not present

## 2017-04-15 ENCOUNTER — Telehealth: Payer: Self-pay | Admitting: Family Medicine

## 2017-04-15 ENCOUNTER — Ambulatory Visit: Payer: Self-pay | Admitting: *Deleted

## 2017-04-15 DIAGNOSIS — I10 Essential (primary) hypertension: Secondary | ICD-10-CM | POA: Diagnosis not present

## 2017-04-15 DIAGNOSIS — Z7902 Long term (current) use of antithrombotics/antiplatelets: Secondary | ICD-10-CM | POA: Diagnosis not present

## 2017-04-15 DIAGNOSIS — M810 Age-related osteoporosis without current pathological fracture: Secondary | ICD-10-CM | POA: Diagnosis not present

## 2017-04-15 DIAGNOSIS — J439 Emphysema, unspecified: Secondary | ICD-10-CM | POA: Diagnosis not present

## 2017-04-15 DIAGNOSIS — E538 Deficiency of other specified B group vitamins: Secondary | ICD-10-CM | POA: Diagnosis not present

## 2017-04-15 DIAGNOSIS — K219 Gastro-esophageal reflux disease without esophagitis: Secondary | ICD-10-CM | POA: Diagnosis not present

## 2017-04-15 DIAGNOSIS — I252 Old myocardial infarction: Secondary | ICD-10-CM | POA: Diagnosis not present

## 2017-04-15 DIAGNOSIS — E785 Hyperlipidemia, unspecified: Secondary | ICD-10-CM | POA: Diagnosis not present

## 2017-04-15 DIAGNOSIS — I509 Heart failure, unspecified: Secondary | ICD-10-CM | POA: Diagnosis not present

## 2017-04-15 DIAGNOSIS — G4733 Obstructive sleep apnea (adult) (pediatric): Secondary | ICD-10-CM | POA: Diagnosis not present

## 2017-04-15 DIAGNOSIS — J449 Chronic obstructive pulmonary disease, unspecified: Secondary | ICD-10-CM | POA: Diagnosis not present

## 2017-04-15 DIAGNOSIS — Z7982 Long term (current) use of aspirin: Secondary | ICD-10-CM | POA: Diagnosis not present

## 2017-04-15 DIAGNOSIS — Z9981 Dependence on supplemental oxygen: Secondary | ICD-10-CM | POA: Diagnosis not present

## 2017-04-15 DIAGNOSIS — I2510 Atherosclerotic heart disease of native coronary artery without angina pectoris: Secondary | ICD-10-CM | POA: Diagnosis not present

## 2017-04-15 NOTE — Telephone Encounter (Signed)
Copied from [place] [id number]. Topic: General - Other >> Apr 15, 2017 11:56 AM Darl Householder, RMA wrote: Reason for CRM: Marlowe Kays from [facility] is requesting verbal orders for OT twice a week x6 weeks and Home health aide for twice a week x5 weeks, please return call to [phone]

## 2017-04-16 DIAGNOSIS — E785 Hyperlipidemia, unspecified: Secondary | ICD-10-CM | POA: Diagnosis not present

## 2017-04-16 DIAGNOSIS — I252 Old myocardial infarction: Secondary | ICD-10-CM | POA: Diagnosis not present

## 2017-04-16 DIAGNOSIS — I2510 Atherosclerotic heart disease of native coronary artery without angina pectoris: Secondary | ICD-10-CM | POA: Diagnosis not present

## 2017-04-16 DIAGNOSIS — K219 Gastro-esophageal reflux disease without esophagitis: Secondary | ICD-10-CM | POA: Diagnosis not present

## 2017-04-16 DIAGNOSIS — M810 Age-related osteoporosis without current pathological fracture: Secondary | ICD-10-CM | POA: Diagnosis not present

## 2017-04-16 DIAGNOSIS — Z7982 Long term (current) use of aspirin: Secondary | ICD-10-CM | POA: Diagnosis not present

## 2017-04-16 DIAGNOSIS — E538 Deficiency of other specified B group vitamins: Secondary | ICD-10-CM | POA: Diagnosis not present

## 2017-04-16 DIAGNOSIS — I10 Essential (primary) hypertension: Secondary | ICD-10-CM | POA: Diagnosis not present

## 2017-04-16 DIAGNOSIS — Z7902 Long term (current) use of antithrombotics/antiplatelets: Secondary | ICD-10-CM | POA: Diagnosis not present

## 2017-04-16 DIAGNOSIS — J439 Emphysema, unspecified: Secondary | ICD-10-CM | POA: Diagnosis not present

## 2017-04-16 DIAGNOSIS — Z9981 Dependence on supplemental oxygen: Secondary | ICD-10-CM | POA: Diagnosis not present

## 2017-04-16 NOTE — Telephone Encounter (Signed)
Please give the order.  Thanks.   

## 2017-04-16 NOTE — Telephone Encounter (Signed)
Connie from [facility] advised.

## 2017-04-19 ENCOUNTER — Other Ambulatory Visit: Payer: Self-pay | Admitting: Radiology

## 2017-04-20 ENCOUNTER — Encounter
Admission: RE | Admit: 2017-04-20 | Discharge: 2017-04-20 | Disposition: A | Discharge status: Home / Self Care | Payer: Medicare Other | Source: Ambulatory Visit | Attending: Urology | Admitting: Urology

## 2017-04-20 ENCOUNTER — Other Ambulatory Visit: Payer: Self-pay

## 2017-04-20 DIAGNOSIS — R0602 Shortness of breath: Secondary | ICD-10-CM | POA: Diagnosis not present

## 2017-04-20 DIAGNOSIS — Z01812 Encounter for preprocedural laboratory examination: Secondary | ICD-10-CM | POA: Diagnosis not present

## 2017-04-20 DIAGNOSIS — I2510 Atherosclerotic heart disease of native coronary artery without angina pectoris: Secondary | ICD-10-CM | POA: Insufficient documentation

## 2017-04-20 DIAGNOSIS — J449 Chronic obstructive pulmonary disease, unspecified: Secondary | ICD-10-CM | POA: Diagnosis not present

## 2017-04-20 DIAGNOSIS — J9611 Chronic respiratory failure with hypoxia: Secondary | ICD-10-CM | POA: Diagnosis not present

## 2017-04-20 DIAGNOSIS — Z01818 Encounter for other preprocedural examination: Secondary | ICD-10-CM | POA: Diagnosis not present

## 2017-04-20 DIAGNOSIS — R9431 Abnormal electrocardiogram [ECG] [EKG]: Secondary | ICD-10-CM | POA: Diagnosis not present

## 2017-04-20 HISTORY — DX: Personal history of urinary calculi: Z87.442

## 2017-04-20 LAB — CBC
HEMATOCRIT: 33.2 % — AB (ref 35.0–47.0)
HEMOGLOBIN: 11.2 g/dL — AB (ref 12.0–16.0)
MCH: 29.8 pg (ref 26.0–34.0)
MCHC: 33.9 g/dL (ref 32.0–36.0)
MCV: 87.9 fL (ref 80.0–100.0)
Platelets: 378 10*3/uL (ref 150–440)
RBC: 3.77 MIL/uL — ABNORMAL LOW (ref 3.80–5.20)
RDW: 13.6 % (ref 11.5–14.5)
WBC: 9.3 10*3/uL (ref 3.6–11.0)

## 2017-04-20 LAB — BASIC METABOLIC PANEL
ANION GAP: 11 (ref 5–15)
BUN: 16 mg/dL (ref 6–20)
CO2: 33 mmol/L — ABNORMAL HIGH (ref 22–32)
Calcium: 9.8 mg/dL (ref 8.9–10.3)
Chloride: 98 mmol/L — ABNORMAL LOW (ref 101–111)
Creatinine, Ser: 1.06 mg/dL — ABNORMAL HIGH (ref 0.44–1.00)
GFR calc Af Amer: 58 mL/min — ABNORMAL LOW (ref >60)
GFR, EST NON AFRICAN AMERICAN: 50 mL/min — AB (ref >60)
GLUCOSE: 95 mg/dL (ref 65–99)
POTASSIUM: 3.6 mmol/L (ref 3.5–5.1)
Sodium: 142 mmol/L (ref 135–145)

## 2017-04-20 NOTE — Patient Instructions (Signed)
Your procedure is scheduled on:Wednesday Jan. 30th , 2019. Report to Same Day Surgery on the second floor of the [facility]. To find out your arrival time please call [phone] between 1PM - 3PM on Tuesday Jan. 29, 2019.  Remember: Instructions that are not followed completely may result in serious medical risk, up to and including death, or upon the discretion of your surgeon and anesthesiologist your surgery may need to be rescheduled.    _x___ 1. Do not eat food after midnight night prior to surgery.     No gum chewing or hard candies, snacks or breakfast.     May drink the following     up until two hours prior to your arrival at the hospital:      water      Gatorade     clear apple juice      black coffee      or black tea      ____ 2. No Alcohol for 24 hours before or after surgery.   ____ 3. Bring all medications with you on the day of surgery if instructed.    __x__ 4. Notify your doctor if there is any change in your medical condition     (cold, fever, infections).    _____ 5.   Do Not Smoke or use e-cigarettes For 24 Hours Prior to Your   Surgery.  Do not use any chewable tobacco products for at least 6   hours prior to  surgery.                  Do not wear jewelry, make-up, hairpins, clips or nail polish.  Do not wear lotions, powders, or perfumes.   Do not shave 48 hours prior to surgery. Men may shave face and neck.  Do not bring valuables to the hospital.    [facility] is not responsible for any belongings or valuables.               Contacts, dentures or bridgework may not be worn into surgery.  Leave your suitcase in the car. After surgery it may be brought to your room.  For patients admitted to the hospital, discharge time is determined by your  treatment team.   Patients discharged the day of surgery will not be allowed to drive home.    Please read over the following fact sheets that you were given:   [facility] [place] Preparing for  Surgery             _x___ Take these medicines the morning of surgery with A SIP OF WATER:    1. ALPRAZolam (XANAX)  2. atorvastatin (LIPITOR)  3. diltiazem (CARDIZEM)  4. metoprolol succinate (TOPROL-XL)   5. ranolazine (RANEXA)        ____ Fleet Enema (as directed)   ____ Use CHG Soap as directed on instruction sheet  _x__ Use inhalers on the day of surgery and bring to hospital day of surgery  ____ Stop metformin 2 days prior to surgery    ____ Take 1/2 of usual insulin dose the night before surgery and none on the morning of          surgery.   __x__ Stop PLAVIX as instructed by Dr. Saralyn Pilar, stop 5 days prior to surgery, last dose will be Jan. 24th, 2019  ____ Stop Anti-inflammatories such as Advil, Aleve, Ibuprofen, Motrin, Naproxen,  Naprosyn, Goodies powders or aspirin products. OK to take Tylenol.   ____ Stop supplements  until after surgery.    ____ Bring C-Pap to the hospital.

## 2017-04-20 NOTE — Pre-Procedure Instructions (Signed)
Pt was unable to void for UA with culture and sensitivity, specimen cup and wipes sent home with pt with instructions for husband to bring urine specimen as soon as possible after void to PAT.  Ginger Carne RN reviewed/compared today's EKG to prior EKG 09/25/2016, OK to proceed.

## 2017-04-21 ENCOUNTER — Encounter
Admission: RE | Admit: 2017-04-21 | Discharge: 2017-04-21 | Disposition: A | Discharge status: Home / Self Care | Payer: Medicare Other | Source: Ambulatory Visit | Attending: Urology | Admitting: Urology

## 2017-04-21 ENCOUNTER — Other Ambulatory Visit: Payer: Self-pay | Admitting: *Deleted

## 2017-04-21 DIAGNOSIS — Z01818 Encounter for other preprocedural examination: Secondary | ICD-10-CM | POA: Diagnosis not present

## 2017-04-21 DIAGNOSIS — Z01812 Encounter for preprocedural laboratory examination: Secondary | ICD-10-CM | POA: Diagnosis not present

## 2017-04-21 DIAGNOSIS — J439 Emphysema, unspecified: Secondary | ICD-10-CM | POA: Diagnosis not present

## 2017-04-21 DIAGNOSIS — E538 Deficiency of other specified B group vitamins: Secondary | ICD-10-CM | POA: Diagnosis not present

## 2017-04-21 DIAGNOSIS — J449 Chronic obstructive pulmonary disease, unspecified: Secondary | ICD-10-CM | POA: Diagnosis not present

## 2017-04-21 DIAGNOSIS — M810 Age-related osteoporosis without current pathological fracture: Secondary | ICD-10-CM | POA: Diagnosis not present

## 2017-04-21 DIAGNOSIS — E785 Hyperlipidemia, unspecified: Secondary | ICD-10-CM | POA: Diagnosis not present

## 2017-04-21 DIAGNOSIS — I10 Essential (primary) hypertension: Secondary | ICD-10-CM | POA: Diagnosis not present

## 2017-04-21 DIAGNOSIS — I2510 Atherosclerotic heart disease of native coronary artery without angina pectoris: Secondary | ICD-10-CM | POA: Diagnosis not present

## 2017-04-21 DIAGNOSIS — I252 Old myocardial infarction: Secondary | ICD-10-CM | POA: Diagnosis not present

## 2017-04-21 DIAGNOSIS — Z9981 Dependence on supplemental oxygen: Secondary | ICD-10-CM | POA: Diagnosis not present

## 2017-04-21 DIAGNOSIS — R9431 Abnormal electrocardiogram [ECG] [EKG]: Secondary | ICD-10-CM | POA: Diagnosis not present

## 2017-04-21 DIAGNOSIS — Z7902 Long term (current) use of antithrombotics/antiplatelets: Secondary | ICD-10-CM | POA: Diagnosis not present

## 2017-04-21 DIAGNOSIS — K219 Gastro-esophageal reflux disease without esophagitis: Secondary | ICD-10-CM | POA: Diagnosis not present

## 2017-04-21 DIAGNOSIS — Z7982 Long term (current) use of aspirin: Secondary | ICD-10-CM | POA: Diagnosis not present

## 2017-04-21 LAB — URINALYSIS, ROUTINE W REFLEX MICROSCOPIC
BACTERIA UA: NONE SEEN
Bilirubin Urine: NEGATIVE
GLUCOSE, UA: NEGATIVE mg/dL
Ketones, ur: NEGATIVE mg/dL
NITRITE: NEGATIVE
PROTEIN: 30 mg/dL — AB
SPECIFIC GRAVITY, URINE: 1.01 (ref 1.005–1.030)
pH: 6 (ref 5.0–8.0)

## 2017-04-21 NOTE — Patient Outreach (Signed)
[facility]) Care Management  04/21/2017   Mckenzie Mclaughlin 06/22/1940 [id number]  RN Health Coach telephone call to patient.  [employer] compliance verified. Per patient she is doing fair. Patient stated she  is short of breath. She stated she always gets this way when she has her physical therapy. Patient is on continuous oxygen. Patient is not on a CPAP. Per patient she is able to go to her Dr appointments but she stated her medications is a little expensive and sometimes hard to afford. Patient is not aware of what the COPD zone and action plan is. Patient had 2 recent falls with skin tears. Patient does not have a medical alert and at this time cannot afford one. Per patient her husband is usually there all the time. Patient decline advance directive and does not want any information at this time. Patient stated she is tired now but  has agreed to further outreach calls on COPD  Current Medications:  Current Outpatient Medications  Medication Sig Dispense Refill  . albuterol (PROAIR HFA) 108 (90 Base) MCG/ACT inhaler INHALE TWO PUFFS BY MOUTH EVERY 6 HOURS AS NEEDED (Patient taking differently: Inhale 2 puffs into the lungs 2 (two) times daily. ) 18 each 6  . ALPRAZolam (XANAX) 0.5 MG tablet TAKE 1 TABLET BY MOUTH 3 TIMES A DAY AS NEEDED (Patient taking differently: Take 0.5 mg by mouth 3 times daily) 90 tablet 1  . aspirin EC 81 MG tablet Take 81 mg by mouth daily.     Marland Kitchen atorvastatin (LIPITOR) 20 MG tablet Take 20 mg by mouth daily.    . budesonide-formoterol (SYMBICORT) 160-4.5 MCG/ACT inhaler Inhale 2 puffs into the lungs 2 (two) times daily. 1 Inhaler 5  . clopidogrel (PLAVIX) 75 MG tablet TAKE 1 TABLET BY MOUTH ONCE A DAY 30 tablet 12  . Cyanocobalamin (B-12 COMPLIANCE INJECTION IJ) Inject 1 Dose as directed every 30 (thirty) days.    Marland Kitchen diltiazem (CARDIZEM) 60 MG tablet Take 1 tablet (60 mg total) by mouth every 6 (six) hours. (Patient taking differently: Take 60 mg by mouth  3 (three) times daily. )    . furosemide (LASIX) 40 MG tablet Take 1 tablet (40 mg total) by mouth daily. 30 tablet 0  . ipratropium-albuterol (DUONEB) 0.5-2.5 (3) MG/3ML SOLN Take 3 mLs by nebulization every 4 (four) hours as needed. (Patient taking differently: Take 3 mLs by nebulization every 4 (four) hours as needed (shortness of breath). ) 360 mL 0  . magnesium hydroxide (MILK OF MAGNESIA) 400 MG/5ML suspension Take 30 mLs by mouth daily. (Patient taking differently: Take 30 mLs by mouth daily as needed for moderate constipation or indigestion. ) 360 mL 0  . metoprolol succinate (TOPROL-XL) 50 MG 24 hr tablet TAKE 1 TABLET BY MOUTH ONCE A DAY WITH OR IMMEDIATELY FOLLOWING A MEAL (Patient taking differently: TAKE 25 MG BY MOUTH ONCE A DAY WITH OR IMMEDIATELY FOLLOWING A MEAL) 30 tablet 12  . neomycin-bacitracin-polymyxin (NEOSPORIN) ointment Apply 1 application topically as needed for wound care.    . nitroGLYCERIN (NITROSTAT) 0.4 MG SL tablet Place 1 tablet (0.4 mg total) under the tongue every 5 (five) minutes as needed for chest pain (max 3 doses in 15 min). 25 tablet 2  . OXYGEN Inhale 2-3 L into the lungs continuous.    . ranolazine (RANEXA) 500 MG 12 hr tablet Take 1 tablet (500 mg total) by mouth 2 (two) times daily. 60 tablet 0  . sulfamethoxazole-trimethoprim (BACTRIM DS,SEPTRA DS) 800-160  MG tablet Take 1 tablet by mouth 2 (two) times daily. (Patient not taking: Reported on 04/13/2017) 10 tablet 0  . tiotropium (SPIRIVA HANDIHALER) 18 MCG inhalation capsule Inhale 63mcg once daily    . venlafaxine (EFFEXOR) 37.5 MG tablet Take 37.5 mg by mouth daily.     Current Facility-Administered Medications  Medication Dose Route Frequency Provider Last Rate Last Dose  . cyanocobalamin ((VITAMIN B-12)) injection 1,000 mcg  1,000 mcg Intramuscular Q30 days Tonia Ghent, MD   1,000 mcg at 09/10/16 1621    Functional Status:  In your present state of health, do you have any difficulty performing  the following activities: 04/21/2017 04/20/2017  Hearing? N N  Vision? N N  Comment per patient wears reading glasses only wears readers  Difficulty concentrating or making decisions? N N  Walking or climbing stairs? Y Y  Comment patient is short of breath on minimal exertion SOB  Dressing or bathing? N N  Doing errands, shopping? Y Y  Comment Husband takes patient to all appointments and errands husband drives pt  Preparing Food and eating ? N -  Using the Toilet? N -  In the past six months, have you accidently leaked urine? N -  Do you have problems with loss of bowel control? N -  Managing your Medications? N -  Managing your Finances? N -  Housekeeping or managing your Housekeeping? Y -  Some recent data might be hidden    Fall/Depression Screening: Fall Risk  04/21/2017 04/05/2017 12/05/2015  Falls in the past year? Yes Yes No  Number falls in past yr: 1 1 -  Injury with Fall? Yes Yes -  Risk Factor Category  High Fall Risk High Fall Risk -  Risk for fall due to : History of fall(s);Impaired balance/gait;Impaired mobility History of fall(s);Impaired mobility -  Follow up Falls evaluation completed;Falls prevention discussed;Education provided Falls evaluation completed;Falls prevention discussed -   PHQ 2/9 Scores 04/21/2017 04/07/2017 12/05/2015 01/22/2015 08/10/2014 07/07/2013  PHQ - 2 Score 3 3 3 2 4 4   PHQ- 9 Score 6 6 13 6  - -   THN CM Care Plan Problem One     Most Recent Value  Care Plan Problem One  Knowledgement Deficit in Self Management of COPD  Role Documenting the Problem One  Marina for Problem One  Active  THN Long Term Goal   Patient will not have any readmissions within the next 90 days  THN Long Term Goal Start Date  04/21/17  Interventions for Problem One Long Term Goal  RN reminded patient to keep appointments as per ordered. Patient reminded to take medication as per Dr ordered. RN discussed with patient about COPD zones and action plan. RN sent  patient a 24 hour nurse contact magnet for her refrigerator.  RN will follow up outreach with further discussion and teach back                                                                          THN CM Short Term Goal #1   Patient will be able to verbalize green zone and action plan within the next 30 days  THN CM Short Term Goal #1 Start  Date  04/21/17  Interventions for Short Term Goal #1  RN discussed the zones and action plan of COPD. RN sent patient a refrigerator magnet with the zones of COPD and action plan. RN sent patient a COPD packet. RN will follow up with further discussion and teach back  THN CM Short Term Goal #2   Patient will not have any falls within the next 30 days  THN CM Short Term Goal #2 Start Date  04/21/17  Interventions for Short Term Goal #2  RN discussed fall prevention. RN sent EMMI educational material on falls prevention. RN will follow up with further discussion and teach back    [facility] CM Care Plan Problem Two     Most Recent Value  THN CM Short Term Goal #1   Patient will be able to verbal the green zone and action plan within 30 days  THN CM Short Term Goal #1 Start Date  04/21/17  Interventions for Short Term Goal #2   with further discussion and teach back       Assessment:  Patient does not know the COPD zones or action plan Patient is receiving physical therapy Patient is on continuous oxygen 2 liters Patient is short of breath with activity Patient will benefit from Health Coach telephonic outreach for education and support for COPD self management.  Plan:  RN sent patient a calendar book to keep up with appointments RN discussed COPD zones and action plan RN sent COPD packet RN sent patient a COPD magnet for refrigerator RN sent EMMI educational material on falls prevention RN sent 24 hr Nurse advise line magnet RN will follow up within the month of February RN sent assessment and barriers letter to [place] Management [phone]

## 2017-04-21 NOTE — Pre-Procedure Instructions (Signed)
Clearance by DR PARASCHOS ON CHART. CLEARANCE FROM DR Raul Del MOD TO HIGH RISK ON CHART

## 2017-04-22 DIAGNOSIS — K219 Gastro-esophageal reflux disease without esophagitis: Secondary | ICD-10-CM | POA: Diagnosis not present

## 2017-04-22 DIAGNOSIS — E785 Hyperlipidemia, unspecified: Secondary | ICD-10-CM | POA: Diagnosis not present

## 2017-04-22 DIAGNOSIS — J439 Emphysema, unspecified: Secondary | ICD-10-CM | POA: Diagnosis not present

## 2017-04-22 DIAGNOSIS — I2510 Atherosclerotic heart disease of native coronary artery without angina pectoris: Secondary | ICD-10-CM | POA: Diagnosis not present

## 2017-04-22 DIAGNOSIS — E538 Deficiency of other specified B group vitamins: Secondary | ICD-10-CM | POA: Diagnosis not present

## 2017-04-22 DIAGNOSIS — I252 Old myocardial infarction: Secondary | ICD-10-CM | POA: Diagnosis not present

## 2017-04-22 DIAGNOSIS — Z7902 Long term (current) use of antithrombotics/antiplatelets: Secondary | ICD-10-CM | POA: Diagnosis not present

## 2017-04-22 DIAGNOSIS — Z7982 Long term (current) use of aspirin: Secondary | ICD-10-CM | POA: Diagnosis not present

## 2017-04-22 DIAGNOSIS — I10 Essential (primary) hypertension: Secondary | ICD-10-CM | POA: Diagnosis not present

## 2017-04-22 DIAGNOSIS — Z9981 Dependence on supplemental oxygen: Secondary | ICD-10-CM | POA: Diagnosis not present

## 2017-04-22 DIAGNOSIS — M810 Age-related osteoporosis without current pathological fracture: Secondary | ICD-10-CM | POA: Diagnosis not present

## 2017-04-22 NOTE — Pre-Procedure Instructions (Signed)
UA FAXED TO DR BRANDON 

## 2017-04-23 ENCOUNTER — Telehealth: Payer: Self-pay | Admitting: Radiology

## 2017-04-23 ENCOUNTER — Telehealth: Payer: Self-pay

## 2017-04-23 ENCOUNTER — Other Ambulatory Visit: Payer: Self-pay

## 2017-04-23 DIAGNOSIS — E785 Hyperlipidemia, unspecified: Secondary | ICD-10-CM | POA: Diagnosis not present

## 2017-04-23 DIAGNOSIS — M810 Age-related osteoporosis without current pathological fracture: Secondary | ICD-10-CM | POA: Diagnosis not present

## 2017-04-23 DIAGNOSIS — Z7902 Long term (current) use of antithrombotics/antiplatelets: Secondary | ICD-10-CM | POA: Diagnosis not present

## 2017-04-23 DIAGNOSIS — I2510 Atherosclerotic heart disease of native coronary artery without angina pectoris: Secondary | ICD-10-CM | POA: Diagnosis not present

## 2017-04-23 DIAGNOSIS — J439 Emphysema, unspecified: Secondary | ICD-10-CM | POA: Diagnosis not present

## 2017-04-23 DIAGNOSIS — I10 Essential (primary) hypertension: Secondary | ICD-10-CM | POA: Diagnosis not present

## 2017-04-23 DIAGNOSIS — E538 Deficiency of other specified B group vitamins: Secondary | ICD-10-CM | POA: Diagnosis not present

## 2017-04-23 DIAGNOSIS — Z7982 Long term (current) use of aspirin: Secondary | ICD-10-CM | POA: Diagnosis not present

## 2017-04-23 DIAGNOSIS — N390 Urinary tract infection, site not specified: Secondary | ICD-10-CM

## 2017-04-23 DIAGNOSIS — R319 Hematuria, unspecified: Principal | ICD-10-CM

## 2017-04-23 DIAGNOSIS — K219 Gastro-esophageal reflux disease without esophagitis: Secondary | ICD-10-CM | POA: Diagnosis not present

## 2017-04-23 DIAGNOSIS — I252 Old myocardial infarction: Secondary | ICD-10-CM | POA: Diagnosis not present

## 2017-04-23 DIAGNOSIS — Z9981 Dependence on supplemental oxygen: Secondary | ICD-10-CM | POA: Diagnosis not present

## 2017-04-23 LAB — URINE CULTURE

## 2017-04-23 MED ORDER — SULFAMETHOXAZOLE-TRIMETHOPRIM 800-160 MG PO TABS: 1.0000 | ORAL_TABLET | Freq: Two times a day (BID) | ORAL | 0 refills | Status: DC

## 2017-04-23 NOTE — Telephone Encounter (Signed)
-----   Message from Hollice Espy, MD sent at 04/23/2017  8:13 AM EST ----- + preoop urine culture.  Please treat with Bactrim DS bid x 7 days.  Hollice Espy, MD

## 2017-04-23 NOTE — Telephone Encounter (Signed)
Pt notified of +ucx & script sent to pharmacy. Dosing instructions given & advised pt to start medication immediately & continue x 7 days including day of surgery. Pt voices understanding with no further questions at this time.

## 2017-04-24 DIAGNOSIS — G4733 Obstructive sleep apnea (adult) (pediatric): Secondary | ICD-10-CM | POA: Diagnosis not present

## 2017-04-24 DIAGNOSIS — J449 Chronic obstructive pulmonary disease, unspecified: Secondary | ICD-10-CM | POA: Diagnosis not present

## 2017-04-24 DIAGNOSIS — I509 Heart failure, unspecified: Secondary | ICD-10-CM | POA: Diagnosis not present

## 2017-04-26 DIAGNOSIS — E538 Deficiency of other specified B group vitamins: Secondary | ICD-10-CM | POA: Diagnosis not present

## 2017-04-26 DIAGNOSIS — M810 Age-related osteoporosis without current pathological fracture: Secondary | ICD-10-CM | POA: Diagnosis not present

## 2017-04-26 DIAGNOSIS — J439 Emphysema, unspecified: Secondary | ICD-10-CM | POA: Diagnosis not present

## 2017-04-26 DIAGNOSIS — K219 Gastro-esophageal reflux disease without esophagitis: Secondary | ICD-10-CM | POA: Diagnosis not present

## 2017-04-26 DIAGNOSIS — Z7902 Long term (current) use of antithrombotics/antiplatelets: Secondary | ICD-10-CM | POA: Diagnosis not present

## 2017-04-26 DIAGNOSIS — I2510 Atherosclerotic heart disease of native coronary artery without angina pectoris: Secondary | ICD-10-CM | POA: Diagnosis not present

## 2017-04-26 DIAGNOSIS — I252 Old myocardial infarction: Secondary | ICD-10-CM | POA: Diagnosis not present

## 2017-04-26 DIAGNOSIS — Z7982 Long term (current) use of aspirin: Secondary | ICD-10-CM | POA: Diagnosis not present

## 2017-04-26 DIAGNOSIS — I10 Essential (primary) hypertension: Secondary | ICD-10-CM | POA: Diagnosis not present

## 2017-04-26 DIAGNOSIS — Z9981 Dependence on supplemental oxygen: Secondary | ICD-10-CM | POA: Diagnosis not present

## 2017-04-26 DIAGNOSIS — E785 Hyperlipidemia, unspecified: Secondary | ICD-10-CM | POA: Diagnosis not present

## 2017-04-27 DIAGNOSIS — I252 Old myocardial infarction: Secondary | ICD-10-CM | POA: Diagnosis not present

## 2017-04-27 DIAGNOSIS — I2510 Atherosclerotic heart disease of native coronary artery without angina pectoris: Secondary | ICD-10-CM | POA: Diagnosis not present

## 2017-04-27 DIAGNOSIS — E538 Deficiency of other specified B group vitamins: Secondary | ICD-10-CM | POA: Diagnosis not present

## 2017-04-27 DIAGNOSIS — Z7982 Long term (current) use of aspirin: Secondary | ICD-10-CM | POA: Diagnosis not present

## 2017-04-27 DIAGNOSIS — M810 Age-related osteoporosis without current pathological fracture: Secondary | ICD-10-CM | POA: Diagnosis not present

## 2017-04-27 DIAGNOSIS — K219 Gastro-esophageal reflux disease without esophagitis: Secondary | ICD-10-CM | POA: Diagnosis not present

## 2017-04-27 DIAGNOSIS — J439 Emphysema, unspecified: Secondary | ICD-10-CM | POA: Diagnosis not present

## 2017-04-27 DIAGNOSIS — E785 Hyperlipidemia, unspecified: Secondary | ICD-10-CM | POA: Diagnosis not present

## 2017-04-27 DIAGNOSIS — Z9981 Dependence on supplemental oxygen: Secondary | ICD-10-CM | POA: Diagnosis not present

## 2017-04-27 DIAGNOSIS — Z7902 Long term (current) use of antithrombotics/antiplatelets: Secondary | ICD-10-CM | POA: Diagnosis not present

## 2017-04-27 DIAGNOSIS — I10 Essential (primary) hypertension: Secondary | ICD-10-CM | POA: Diagnosis not present

## 2017-04-27 MED ORDER — CIPROFLOXACIN IN D5W 400 MG/200ML IV SOLN
400.0000 mg | Freq: Once | INTRAVENOUS | Status: AC
  Administered 2017-04-28: 400 mg via INTRAVENOUS

## 2017-04-28 ENCOUNTER — Ambulatory Visit: Payer: Medicare Other | Admitting: Certified Registered Nurse Anesthetist

## 2017-04-28 ENCOUNTER — Encounter
Admission: RE | Disposition: A | Discharge status: Home / Self Care | Payer: Self-pay | Source: Ambulatory Visit | Attending: Urology

## 2017-04-28 ENCOUNTER — Ambulatory Visit
Admission: RE | Admit: 2017-04-28 | Discharge: 2017-04-28 | Disposition: A | Discharge status: Home / Self Care | Payer: Medicare Other | Source: Ambulatory Visit | Attending: Urology | Admitting: Urology

## 2017-04-28 DIAGNOSIS — Z792 Long term (current) use of antibiotics: Secondary | ICD-10-CM | POA: Diagnosis not present

## 2017-04-28 DIAGNOSIS — Z7951 Long term (current) use of inhaled steroids: Secondary | ICD-10-CM | POA: Insufficient documentation

## 2017-04-28 DIAGNOSIS — Z955 Presence of coronary angioplasty implant and graft: Secondary | ICD-10-CM | POA: Insufficient documentation

## 2017-04-28 DIAGNOSIS — N200 Calculus of kidney: Secondary | ICD-10-CM | POA: Diagnosis not present

## 2017-04-28 DIAGNOSIS — N3001 Acute cystitis with hematuria: Secondary | ICD-10-CM | POA: Insufficient documentation

## 2017-04-28 DIAGNOSIS — Z7982 Long term (current) use of aspirin: Secondary | ICD-10-CM | POA: Diagnosis not present

## 2017-04-28 DIAGNOSIS — N132 Hydronephrosis with renal and ureteral calculous obstruction: Secondary | ICD-10-CM | POA: Insufficient documentation

## 2017-04-28 DIAGNOSIS — J449 Chronic obstructive pulmonary disease, unspecified: Secondary | ICD-10-CM | POA: Insufficient documentation

## 2017-04-28 DIAGNOSIS — I10 Essential (primary) hypertension: Secondary | ICD-10-CM | POA: Insufficient documentation

## 2017-04-28 DIAGNOSIS — Z7902 Long term (current) use of antithrombotics/antiplatelets: Secondary | ICD-10-CM | POA: Diagnosis not present

## 2017-04-28 DIAGNOSIS — I25110 Atherosclerotic heart disease of native coronary artery with unstable angina pectoris: Secondary | ICD-10-CM | POA: Diagnosis not present

## 2017-04-28 DIAGNOSIS — I252 Old myocardial infarction: Secondary | ICD-10-CM | POA: Insufficient documentation

## 2017-04-28 DIAGNOSIS — I2510 Atherosclerotic heart disease of native coronary artery without angina pectoris: Secondary | ICD-10-CM | POA: Insufficient documentation

## 2017-04-28 DIAGNOSIS — Z9013 Acquired absence of bilateral breasts and nipples: Secondary | ICD-10-CM | POA: Diagnosis not present

## 2017-04-28 DIAGNOSIS — Z87891 Personal history of nicotine dependence: Secondary | ICD-10-CM | POA: Diagnosis not present

## 2017-04-28 DIAGNOSIS — Z85828 Personal history of other malignant neoplasm of skin: Secondary | ICD-10-CM | POA: Insufficient documentation

## 2017-04-28 DIAGNOSIS — F419 Anxiety disorder, unspecified: Secondary | ICD-10-CM | POA: Insufficient documentation

## 2017-04-28 DIAGNOSIS — I739 Peripheral vascular disease, unspecified: Secondary | ICD-10-CM | POA: Insufficient documentation

## 2017-04-28 DIAGNOSIS — F329 Major depressive disorder, single episode, unspecified: Secondary | ICD-10-CM | POA: Insufficient documentation

## 2017-04-28 DIAGNOSIS — Z79899 Other long term (current) drug therapy: Secondary | ICD-10-CM | POA: Insufficient documentation

## 2017-04-28 DIAGNOSIS — J439 Emphysema, unspecified: Secondary | ICD-10-CM | POA: Diagnosis not present

## 2017-04-28 HISTORY — PX: CYSTOSCOPY/URETEROSCOPY/HOLMIUM LASER/STENT PLACEMENT: SHX6546

## 2017-04-28 HISTORY — PX: CYSTOSCOPY W/ RETROGRADES: SHX1426

## 2017-04-28 SURGERY — CYSTOSCOPY/URETEROSCOPY/HOLMIUM LASER/STENT PLACEMENT
Anesthesia: General | Site: Ureter | Laterality: Left | Wound class: Clean Contaminated

## 2017-04-28 MED ORDER — ONDANSETRON HCL 4 MG/2ML IJ SOLN
INTRAMUSCULAR | Status: AC
  Administered 2017-04-28: 4 mg via INTRAVENOUS
  Filled 2017-04-28: qty 2

## 2017-04-28 MED ORDER — DOCUSATE SODIUM 100 MG PO CAPS: 100.0000 mg | ORAL_CAPSULE | Freq: Two times a day (BID) | ORAL | 0 refills | Status: DC

## 2017-04-28 MED ORDER — ONDANSETRON HCL 4 MG/2ML IJ SOLN
4.0000 mg | Freq: Once | INTRAMUSCULAR | Status: AC
  Administered 2017-04-28: 4 mg via INTRAVENOUS

## 2017-04-28 MED ORDER — LACTATED RINGERS IV SOLN
INTRAVENOUS | Status: DC
  Administered 2017-04-28: 12:00:00 via INTRAVENOUS

## 2017-04-28 MED ORDER — DEXAMETHASONE SODIUM PHOSPHATE 10 MG/ML IJ SOLN
INTRAMUSCULAR | Status: AC
  Filled 2017-04-28: qty 1

## 2017-04-28 MED ORDER — FENTANYL CITRATE (PF) 100 MCG/2ML IJ SOLN
25.0000 ug | INTRAMUSCULAR | Status: DC | PRN
  Administered 2017-04-28: 25 ug via INTRAVENOUS

## 2017-04-28 MED ORDER — LIDOCAINE HCL (CARDIAC) 20 MG/ML IV SOLN
INTRAVENOUS | Status: DC | PRN
  Administered 2017-04-28: 60 mg via INTRAVENOUS

## 2017-04-28 MED ORDER — IOTHALAMATE MEGLUMINE 43 % IV SOLN
INTRAVENOUS | Status: DC | PRN
  Administered 2017-04-28: 15 mL via URETHRAL

## 2017-04-28 MED ORDER — PHENYLEPHRINE HCL 10 MG/ML IJ SOLN
INTRAMUSCULAR | Status: DC | PRN
  Administered 2017-04-28 (×4): 100 ug via INTRAVENOUS

## 2017-04-28 MED ORDER — FENTANYL CITRATE (PF) 100 MCG/2ML IJ SOLN
INTRAMUSCULAR | Status: AC
  Filled 2017-04-28: qty 2

## 2017-04-28 MED ORDER — FENTANYL CITRATE (PF) 100 MCG/2ML IJ SOLN
INTRAMUSCULAR | Status: DC | PRN
  Administered 2017-04-28: 50 ug via INTRAVENOUS

## 2017-04-28 MED ORDER — DEXAMETHASONE SODIUM PHOSPHATE 10 MG/ML IJ SOLN
INTRAMUSCULAR | Status: DC | PRN
  Administered 2017-04-28: 10 mg via INTRAVENOUS

## 2017-04-28 MED ORDER — SUCCINYLCHOLINE CHLORIDE 20 MG/ML IJ SOLN
INTRAMUSCULAR | Status: DC | PRN
  Administered 2017-04-28: 100 mg via INTRAVENOUS

## 2017-04-28 MED ORDER — CIPROFLOXACIN IN D5W 400 MG/200ML IV SOLN
INTRAVENOUS | Status: AC
  Filled 2017-04-28: qty 200

## 2017-04-28 MED ORDER — LIDOCAINE HCL (PF) 2 % IJ SOLN
INTRAMUSCULAR | Status: AC
  Filled 2017-04-28: qty 10

## 2017-04-28 MED ORDER — HYDROCORTISONE NA SUCCINATE PF 100 MG IJ SOLR
INTRAMUSCULAR | Status: DC | PRN
  Administered 2017-04-28: 125 mg via INTRAVENOUS

## 2017-04-28 MED ORDER — ONDANSETRON HCL 4 MG/2ML IJ SOLN
INTRAMUSCULAR | Status: DC | PRN
  Administered 2017-04-28: 4 mg via INTRAVENOUS

## 2017-04-28 MED ORDER — EPHEDRINE SULFATE 50 MG/ML IJ SOLN
INTRAMUSCULAR | Status: DC | PRN
  Administered 2017-04-28 (×3): 10 mg via INTRAVENOUS

## 2017-04-28 MED ORDER — DEXTROSE 5 % IV SOLN
5.0000 mg/kg | Freq: Once | INTRAVENOUS | Status: AC
  Administered 2017-04-28: 350 mg via INTRAVENOUS
  Filled 2017-04-28: qty 8.75

## 2017-04-28 MED ORDER — HYDROCODONE-ACETAMINOPHEN 5-325 MG PO TABS: 1.0000 | ORAL_TABLET | Freq: Four times a day (QID) | ORAL | 0 refills | Status: DC | PRN

## 2017-04-28 MED ORDER — FAMOTIDINE 20 MG PO TABS
20.0000 mg | ORAL_TABLET | Freq: Once | ORAL | Status: AC
  Administered 2017-04-28: 20 mg via ORAL

## 2017-04-28 MED ORDER — PROPOFOL 10 MG/ML IV BOLUS
INTRAVENOUS | Status: DC | PRN
  Administered 2017-04-28: 90 mg via INTRAVENOUS

## 2017-04-28 MED ORDER — ONDANSETRON HCL 4 MG/2ML IJ SOLN
4.0000 mg | Freq: Once | INTRAMUSCULAR | Status: AC | PRN
  Administered 2017-04-28: 4 mg via INTRAVENOUS

## 2017-04-28 MED ORDER — HYDROCORTISONE NA SUCCINATE PF 100 MG IJ SOLR
INTRAMUSCULAR | Status: AC
  Filled 2017-04-28: qty 4

## 2017-04-28 MED ORDER — FAMOTIDINE 20 MG PO TABS
ORAL_TABLET | ORAL | Status: AC
  Administered 2017-04-28: 20 mg via ORAL
  Filled 2017-04-28: qty 1

## 2017-04-28 MED ORDER — ONDANSETRON HCL 4 MG/2ML IJ SOLN
INTRAMUSCULAR | Status: AC
  Filled 2017-04-28: qty 2

## 2017-04-28 MED ORDER — PROPOFOL 10 MG/ML IV BOLUS
INTRAVENOUS | Status: AC
  Filled 2017-04-28: qty 20

## 2017-04-28 SURGICAL SUPPLY — 34 items
BAG DRAIN CYSTO-URO LG1000N (MISCELLANEOUS) ×4 IMPLANT
BASKET ZERO TIP 1.9FR (BASKET) ×2 IMPLANT
BRUSH SCRUB EZ  4% CHG (MISCELLANEOUS) ×2
BRUSH SCRUB EZ 1% IODOPHOR (MISCELLANEOUS) ×4 IMPLANT
BRUSH SCRUB EZ 4% CHG (MISCELLANEOUS) ×2 IMPLANT
BSKT STON RTRVL ZERO TP 1.9FR (BASKET) ×2
CATH URETL 5X70 OPEN END (CATHETERS) ×4 IMPLANT
CNTNR SPEC 2.5X3XGRAD LEK (MISCELLANEOUS) ×2
CONRAY 43 FOR UROLOGY 50M (MISCELLANEOUS) ×4 IMPLANT
CONT SPEC 4OZ STER OR WHT (MISCELLANEOUS) ×2
CONT SPEC 4OZ STRL OR WHT (MISCELLANEOUS) ×2
CONTAINER SPEC 2.5X3XGRAD LEK (MISCELLANEOUS) IMPLANT
DRAPE UTILITY 15X26 TOWEL STRL (DRAPES) ×4 IMPLANT
FIBER LASER LITHO 273 (Laser) ×2 IMPLANT
GLOVE BIO SURGEON STRL SZ 6.5 (GLOVE) ×3 IMPLANT
GLOVE BIO SURGEONS STRL SZ 6.5 (GLOVE) ×1
GOWN STRL REUS W/ TWL LRG LVL3 (GOWN DISPOSABLE) ×4 IMPLANT
GOWN STRL REUS W/TWL LRG LVL3 (GOWN DISPOSABLE) ×8
GUIDEWIRE GREEN .038 145CM (MISCELLANEOUS) ×2 IMPLANT
INFUSOR MANOMETER BAG 3000ML (MISCELLANEOUS) ×4 IMPLANT
INTRODUCER DILATOR DOUBLE (INTRODUCER) ×2 IMPLANT
KIT TURNOVER CYSTO (KITS) ×4 IMPLANT
PACK CYSTO AR (MISCELLANEOUS) ×4 IMPLANT
SENSORWIRE 0.038 NOT ANGLED (WIRE) ×4
SET CYSTO W/LG BORE CLAMP LF (SET/KITS/TRAYS/PACK) ×4 IMPLANT
SHEATH URETERAL 12FRX35CM (MISCELLANEOUS) ×2 IMPLANT
SOL .9 NS 3000ML IRR  AL (IV SOLUTION) ×2
SOL .9 NS 3000ML IRR AL (IV SOLUTION) ×2
SOL .9 NS 3000ML IRR UROMATIC (IV SOLUTION) ×2 IMPLANT
STENT URET 6FRX24 CONTOUR (STENTS) ×2 IMPLANT
STENT URET 6FRX26 CONTOUR (STENTS) IMPLANT
SURGILUBE 2OZ TUBE FLIPTOP (MISCELLANEOUS) ×4 IMPLANT
WATER STERILE IRR 1000ML POUR (IV SOLUTION) ×4 IMPLANT
WIRE SENSOR 0.038 NOT ANGLED (WIRE) ×4 IMPLANT

## 2017-04-28 NOTE — Transfer of Care (Signed)
Immediate Anesthesia Transfer of Care Note  Patient: Mckenzie Mclaughlin  Procedure(s) Performed: CYSTOSCOPY/URETEROSCOPY/HOLMIUM LASER/STENT PLACEMENT (Left Ureter) CYSTOSCOPY WITH RETROGRADE PYELOGRAM (Bilateral Ureter)  Patient Location: PACU  Anesthesia Type:General  Level of Consciousness: awake and responds to stimulation  Airway & Oxygen Therapy: Patient Spontanous Breathing and Patient connected to nasal cannula oxygen  Post-op Assessment: Report given to RN and Post -op Vital signs reviewed and stable  Post vital signs: Reviewed and stable  Last Vitals:  Vitals:   04/28/17 1201 04/28/17 1549  BP: (!) 132/49 (!) 143/72  Pulse: 66 84  Resp: (!) 22 15  Temp: (!) 36.2 C   SpO2: 100% 100%    Last Pain:  Vitals:   04/28/17 1201  TempSrc: Tympanic         Complications: No apparent anesthesia complications

## 2017-04-28 NOTE — Anesthesia Procedure Notes (Signed)
Procedure Name: Intubation Date/Time: 04/28/2017 1:40 PM Performed by: Eben Burow, CRNA Pre-anesthesia Checklist: Patient identified, Suction available, Emergency Drugs available, Patient being monitored and Timeout performed Patient Re-evaluated:Patient Re-evaluated prior to induction Oxygen Delivery Method: Circle system utilized Preoxygenation: Pre-oxygenation with 100% oxygen Induction Type: IV induction Ventilation: Mask ventilation without difficulty Laryngoscope Size: McGraph and 3 Grade View: Grade I Tube type: Oral Tube size: 7.0 mm Number of attempts: 1 Airway Equipment and Method: Stylet and Video-laryngoscopy Placement Confirmation: ETT inserted through vocal cords under direct vision,  positive ETCO2 and breath sounds checked- equal and bilateral Secured at: 22 cm Tube secured with: Tape Dental Injury: Teeth and Oropharynx as per pre-operative assessment

## 2017-04-28 NOTE — Interval H&P Note (Signed)
History and Physical Interval Note:  04/28/2017 1:26 PM  Mckenzie Mclaughlin  has presented today for surgery, with the diagnosis of left nephrolithiasis  The various methods of treatment have been discussed with the patient and family. After consideration of risks, benefits and other options for treatment, the patient has consented to  Procedure(s): CYSTOSCOPY/URETEROSCOPY/HOLMIUM LASER/STENT PLACEMENT (Left) CYSTOSCOPY WITH RETROGRADE PYELOGRAM (Bilateral) as a surgical intervention .  The patient's history has been reviewed, patient examined, no change in status, stable for surgery.  I have reviewed the patient's chart and labs.  Questions were answered to the patient's satisfaction.    RRR CTAB   Hollice Espy

## 2017-04-28 NOTE — OR Nursing (Signed)
Discharge instructions discussed with pt husband. Husband voices understanding. Pt with productive cough. Coughing up clear sputum at intervals.

## 2017-04-28 NOTE — Anesthesia Postprocedure Evaluation (Signed)
Anesthesia Post Note  Patient: Mckenzie Mclaughlin  Procedure(s) Performed: CYSTOSCOPY/URETEROSCOPY/HOLMIUM LASER/STENT PLACEMENT (Left Ureter) CYSTOSCOPY WITH RETROGRADE PYELOGRAM (Bilateral Ureter)  Patient location during evaluation: PACU Anesthesia Type: General Level of consciousness: awake and alert and oriented Pain management: pain level controlled Vital Signs Assessment: post-procedure vital signs reviewed and stable Respiratory status: spontaneous breathing, nonlabored ventilation and respiratory function stable Cardiovascular status: blood pressure returned to baseline and stable Postop Assessment: no signs of nausea or vomiting Anesthetic complications: no     Last Vitals:  Vitals:   04/28/17 1630 04/28/17 1636  BP:  (!) 141/66  Pulse: 85 85  Resp: 10 12  Temp:    SpO2: 100% 100%    Last Pain:  Vitals:   04/28/17 1630  TempSrc:   PainSc: 2                  Ronte Parker

## 2017-04-28 NOTE — Progress Notes (Signed)
ANTIBIOTIC CONSULT NOTE - INITIAL  Pharmacy Consult for gentamicin Indication: surgical prophylaxis  Allergies  Allergen Reactions  . Amoxicillin Shortness Of Breath    Has patient had a PCN reaction causing immediate rash, facial/tongue/throat swelling, SOB or lightheadedness with hypotension: Yes Has patient had a PCN reaction causing severe rash involving mucus membranes or skin necrosis: No Has patient had a PCN reaction that required hospitalization: No Has patient had a PCN reaction occurring within the last 10 years: No If all of the above answers are "NO", then may proceed with Cephalosporin use.   Marland Kitchen Doxycycline Nausea Only and Other (See Comments)    Dizziness   . Ibuprofen Nausea And Vomiting  . Sertraline Hcl Nausea And Vomiting    REACTION: trembling    Patient Measurements:   Adjusted Body Weight:   Vital Signs: Temp: 97.2 F (36.2 C) (01/30 1201) Temp Source: Tympanic (01/30 1201) BP: 132/49 (01/30 1201) Pulse Rate: 66 (01/30 1201) Intake/Output from previous day: No intake/output data recorded. Intake/Output from this shift: No intake/output data recorded.  Labs: No results for input(s): WBC, HGB, PLT, LABCREA, CREATININE in the last 72 hours. Estimated Creatinine Clearance: 45.3 mL/min (A) (by C-G formula based on SCr of 1.06 mg/dL (H)). No results for input(s): VANCOTROUGH, VANCOPEAK, VANCORANDOM, GENTTROUGH, GENTPEAK, GENTRANDOM, TOBRATROUGH, TOBRAPEAK, TOBRARND, AMIKACINPEAK, AMIKACINTROU, AMIKACIN in the last 72 hours.   Microbiology: Recent Results (from the past 720 hour(s))  Urine culture     Status: Abnormal   Collection Time: 04/21/17 10:11 AM  Result Value Ref Range Status   Specimen Description   Final    URINE, RANDOM Performed at [facility], [address] [place], [place] [address]    Special Requests   Final    NONE Performed at [facility], [place], [place] [address]    Culture >=100,000  COLONIES/mL ESCHERICHIA COLI (A)  Final   Report Status 04/23/2017 FINAL  Final   Organism ID, Bacteria ESCHERICHIA COLI (A)  Final      Susceptibility   Escherichia coli - MIC*    AMPICILLIN 8 SENSITIVE Sensitive     CEFAZOLIN <=4 SENSITIVE Sensitive     CEFTRIAXONE <=1 SENSITIVE Sensitive     CIPROFLOXACIN <=0.25 SENSITIVE Sensitive     GENTAMICIN <=1 SENSITIVE Sensitive     IMIPENEM <=0.25 SENSITIVE Sensitive     NITROFURANTOIN <=16 SENSITIVE Sensitive     TRIMETH/SULFA <=20 SENSITIVE Sensitive     AMPICILLIN/SULBACTAM 4 SENSITIVE Sensitive     PIP/TAZO <=4 SENSITIVE Sensitive     Extended ESBL NEGATIVE Sensitive     * >=100,000 COLONIES/mL ESCHERICHIA COLI    Medical History: Past Medical History:  Diagnosis Date  . Anginal pain [facility])    see dr [name]  "Sp[facility]"  . Anxiety   . Arthritis   . Cancer (HCC)    side of head- skin cancer, squamous cell ca on left foot.  . Chronic airway obstruction (HCC)    chronic airway obstruction not elsewhere classified, unspecified  . Chronic obstructive asthma ([place])    unspecified  . Complication of anesthesia   . Constipation   . COPD (chronic obstructive pulmonary disease) ([place])   . COPD with emphysema ([place]) 01/05/2013  . COPD, severe 07/29/98  . Coronary artery disease 01/22/99   Non Q-wave MI, stent RCA  . Coronary atherosclerosis of native coronary artery 01/22/1999   status post stent RCA,   . Depression   . Dysrhythmia    Palpations  at times. last time 10/29 ish 2014  . Esophageal dilatation    Pt needs appple sauce to take meds.  . Esophageal stricture    PT needs apple sauce to take meds  . GERD (gastroesophageal reflux disease) 08/29/98   severe-no meds now  . Head injury, acute, with loss of consciousness ([place]) 11/2012   unsure how long  . History of blood transfusion   . History of blurry vision 05/06-05/10/2009   Hospital [facility] CP R/O'D Blurry vision, smoking  . History of CT scan of head 08/02/09   w/o mild  age appropr atrophy  . History of ETT 08/03/09   Myoview Normal  . History of ETT 05/1999   Cardiolite pos ETT, neg Cardiolite  . History of ETT 09/02/01   Normal  . History of ETT 04/28/2006   Myoview normal EF 83%  . History of ETT 04/10/11   normal EF and no ischemia per Dr. Saralyn Pilar  . History of kidney stones 03/2017  . History of pneumothorax 01/05/2013  . Hx of cardiac catheterization 08/29/01   60% RCA 0/w 20-30% lesions  . Hx of cardiac catheterization 01/22/99   w/Stent 90% RCA lesion  . Hyperlipidemia, unspecified   . Hypertension    03/30/00  . Mental disorder   . On home oxygen therapy    as needed-has a travel tank  . Pneumothorax 2/14  . PONV (postoperative nausea and vomiting)    ad breast remocved and see was under anesthesia a long time  . Shortness of breath   . Status post aortic coarctation stent placement   . Tobacco abuse    1/4 pack daily    Medications:  Infusions:  . ciprofloxacin    . ciprofloxacin    . gentamicin    . lactated ringers 25 mL/hr at 04/28/17 1218   Assessment: 73 yof with planned cystoscopy, ureteroscopy, stent placement. Pharmacy consulted to dose gentamicin for surgical prophylaxis.  Goal of Therapy:    Plan:  Gentamicin 5 mg/kg (=350 mg) IV x 1. Postop doses are rarely required. If this case requires continued dosing please re-consult pharmacy and include duration of needed prophylaxis.  [name], Pharm.D., BCPS Clinical Pharmacist 04/28/2017,12:25 PM

## 2017-04-28 NOTE — Anesthesia Preprocedure Evaluation (Signed)
Anesthesia Evaluation  Patient identified by MRN, date of birth, ID band Patient awake    Reviewed: Allergy & Precautions, H&P , NPO status , Patient's Chart, lab work & pertinent test results, reviewed documented beta blocker date and time   History of Anesthesia Complications (+) PONV and history of anesthetic complications  Airway Mallampati: II  TM Distance: >3 FB Neck ROM: Full    Dental  (+) Edentulous Upper, Dental Advisory Given   Pulmonary shortness of breath (rsometimes uses oxygen), with exertion and Long-Term Oxygen Therapy, asthma , COPD,  COPD inhaler, former smoker,  Lung biopsy 12/13, had PTX at that time, resolved   breath sounds clear to auscultation       Cardiovascular hypertension, Pt. on medications + angina (last 3 weeks ago, stress test OK as per pt) with exertion + CAD, + Past MI, + Cardiac Stents (RCA stent, otherwise non-obstructive ASCADz) and + Peripheral Vascular Disease  + dysrhythmias  Rhythm:Regular Rate:Normal  '13 ECHO: EF>55%, valves OK 9/14 stress test: OK as per patient   Neuro/Psych  Headaches, PSYCHIATRIC DISORDERS Anxiety Depression negative neurological ROS     GI/Hepatic Neg liver ROS, GERD  Controlled,  Endo/Other  negative endocrine ROS  Renal/GU negative Renal ROS     Musculoskeletal  (+) Arthritis , Osteoarthritis,    Abdominal Normal abdominal exam  (+)   Peds negative pediatric ROS (+)  Hematology negative hematology ROS (+)   Anesthesia Other Findings   Reproductive/Obstetrics                             Anesthesia Physical  Anesthesia Plan  ASA: III  Anesthesia Plan: General   Post-op Pain Management:    Induction: Intravenous  PONV Risk Score and Plan:   Airway Management Planned: Oral ETT  Additional Equipment:   Intra-op Plan:   Post-operative Plan: Extubation in OR  Informed Consent: I have reviewed the patients  History and Physical, chart, labs and discussed the procedure including the risks, benefits and alternatives for the proposed anesthesia with the patient or authorized representative who has indicated his/her understanding and acceptance.   Dental advisory given  Plan Discussed with: CRNA, Anesthesiologist and Surgeon  Anesthesia Plan Comments: (Plan routine monitors, GA- LMA OK)        Anesthesia Quick Evaluation

## 2017-04-28 NOTE — Op Note (Signed)
Date of procedure: 04/28/17  Preoperative diagnosis:  1. Left kidney stones  Postoperative diagnosis:  1. Left kidney stone  Procedure: 1. Left ureteroscopy 2. Laser lithotripsy 3. Left retrograde pyelogram 4. Left ureteral stent placement 5. Basket extraction of stone fragment  Surgeon: Hollice Espy, MD  Anesthesia: General  Complications: None  Intraoperative findings: Large 16 mm renal pelvic stone treated.  No other renal pathology identified.  EBL: Minimal  Specimens: Stone fragment  Drains: 6 x 24 French double-J ureteral stent on the left  Indication: Mckenzie Mclaughlin is a 77 y.o. patient with history of gross hematuria and an enlarging left renal pelvic stone measuring 16 mm with associated hydronephrosis and perinephric stranding with recurrent infections.  After reviewing the management options for treatment, se elected to proceed with the above surgical procedure(s). We have discussed the potential benefits and risks of the procedure, side effects of the proposed treatment, the likelihood of the patient achieving the goals of the procedure, and any potential problems that might occur during the procedure or recuperation. Informed consent has been obtained.  Description of procedure:  The patient was taken to the operating room and general anesthesia was induced.  The patient was placed in the dorsal lithotomy position, prepped and draped in the usual sterile fashion, and preoperative antibiotics were administered. A preoperative time-out was performed.   A 21 French cystoscope was advanced per urethra into the bladder.  Attention was turned to the left ureteral orifice which was cannulated using a sensor wire up to level of the kidney.  The large renal pelvic stone was easily visible on scout imaging.  A dual-lumen sheath was then used to introduce a working [employer] wire up to level of the stone as well.  [name] 12/14 ureteral access sheath was then advanced to the  proximal ureter without difficulty.  The inner lumen was removed.  An 8 French flexible dual-lumen Wolff ureteroscope was then advanced through the access sheath into the kidney where the stone was immediately encountered.  A 273 m fiber was then brought in and using the settings of initially 0.2 J and 40 Hz followed by 1.2 J and 15 Hz, the large renal pelvic stone was obliterated into innumerable smaller pieces.  A 1.9 French tipless nitinol basket was then used to basket the majority of the stone.  Each and every calyx was directly visualized and there is no additional renal pathology identified.  There was some residual very small stone debris but of inconsequential size which could not be basketed.  The majority of this debris was no larger than the tip of the laser fiber.  Once the collecting system was adequately cleared, retrograde pyelogram was performed.  A roadmap of the kidney.  Each every calyx was then directly visualized to ensure that there was no residual significant stone burden remaining.  The scope was then backed down the length of the ureter removing the access sheath along the way.  There were no ureteral fragments or injuries appreciated.  A 6 x 24 French double-J ureteral stent was then advanced over the wire up to level of the kidney.  The wires partially drawn until coils noted within the renal pelvis.  The wire was then fully withdrawn a full coils noted within the bladder.  The bladder was then drained.  She was then cleaned and dried, repositioned in supine position, and taken to PACU in stable condition.    Plan: She will return to the office next week for  cystoscopy, stent removal.  She will complete her course of antibiotics as she was already taking.  All questions were answered.  Hollice Espy, M.D.

## 2017-04-28 NOTE — Anesthesia Post-op Follow-up Note (Signed)
Anesthesia QCDR form completed.        

## 2017-04-28 NOTE — Discharge Instructions (Signed)
You have a ureteral stent in place.  This is a tube that extends from your kidney to your bladder.  This may cause urinary bleeding, burning with urination, and urinary frequency.  Please call our office or present to the ED if you develop fevers >101 or pain which is not able to be controlled with oral pain medications.  You may be given either Flomax and/ or ditropan to help with bladder spasms and stent pain in addition to pain medications.   ° °[facility] °[address], [address] °[place], [place] [address] °[phone] ° ° ° °AMBULATORY SURGERY  °DISCHARGE INSTRUCTIONS ° ° °1) The drugs that you were given will stay in your system until tomorrow so for the next 24 hours you should not: ° °A) Drive an automobile °B) Make any legal decisions °C) Drink any alcoholic beverage ° ° °2) You may resume regular meals tomorrow.  Today it is better to start with liquids and gradually work up to solid foods. ° °You may eat anything you prefer, but it is better to start with liquids, then soup and crackers, and gradually work up to solid foods. ° ° °3) Please notify your doctor immediately if you have any unusual bleeding, trouble breathing, redness and pain at the surgery site, drainage, fever, or pain not relieved by medication. ° ° ° °4) Additional Instructions: ° ° ° ° ° ° ° °Please contact your physician with any problems or Same Day Surgery at [phone], Monday through Friday 6 am to 4 pm, or [facility] Main number at [phone]. °

## 2017-04-29 ENCOUNTER — Encounter: Payer: Self-pay | Admitting: Urology

## 2017-04-29 DIAGNOSIS — I10 Essential (primary) hypertension: Secondary | ICD-10-CM | POA: Diagnosis not present

## 2017-04-29 DIAGNOSIS — J439 Emphysema, unspecified: Secondary | ICD-10-CM | POA: Diagnosis not present

## 2017-04-29 DIAGNOSIS — Z7902 Long term (current) use of antithrombotics/antiplatelets: Secondary | ICD-10-CM | POA: Diagnosis not present

## 2017-04-29 DIAGNOSIS — E785 Hyperlipidemia, unspecified: Secondary | ICD-10-CM | POA: Diagnosis not present

## 2017-04-29 DIAGNOSIS — Z9981 Dependence on supplemental oxygen: Secondary | ICD-10-CM | POA: Diagnosis not present

## 2017-04-29 DIAGNOSIS — K219 Gastro-esophageal reflux disease without esophagitis: Secondary | ICD-10-CM | POA: Diagnosis not present

## 2017-04-29 DIAGNOSIS — Z7982 Long term (current) use of aspirin: Secondary | ICD-10-CM | POA: Diagnosis not present

## 2017-04-29 DIAGNOSIS — I252 Old myocardial infarction: Secondary | ICD-10-CM | POA: Diagnosis not present

## 2017-04-29 DIAGNOSIS — I2510 Atherosclerotic heart disease of native coronary artery without angina pectoris: Secondary | ICD-10-CM | POA: Diagnosis not present

## 2017-04-29 DIAGNOSIS — M810 Age-related osteoporosis without current pathological fracture: Secondary | ICD-10-CM | POA: Diagnosis not present

## 2017-04-29 DIAGNOSIS — E538 Deficiency of other specified B group vitamins: Secondary | ICD-10-CM | POA: Diagnosis not present

## 2017-04-30 DIAGNOSIS — Z7982 Long term (current) use of aspirin: Secondary | ICD-10-CM | POA: Diagnosis not present

## 2017-04-30 DIAGNOSIS — Z7902 Long term (current) use of antithrombotics/antiplatelets: Secondary | ICD-10-CM | POA: Diagnosis not present

## 2017-04-30 DIAGNOSIS — I252 Old myocardial infarction: Secondary | ICD-10-CM | POA: Diagnosis not present

## 2017-04-30 DIAGNOSIS — I2510 Atherosclerotic heart disease of native coronary artery without angina pectoris: Secondary | ICD-10-CM | POA: Diagnosis not present

## 2017-04-30 DIAGNOSIS — E538 Deficiency of other specified B group vitamins: Secondary | ICD-10-CM | POA: Diagnosis not present

## 2017-04-30 DIAGNOSIS — J439 Emphysema, unspecified: Secondary | ICD-10-CM | POA: Diagnosis not present

## 2017-04-30 DIAGNOSIS — M810 Age-related osteoporosis without current pathological fracture: Secondary | ICD-10-CM | POA: Diagnosis not present

## 2017-04-30 DIAGNOSIS — K219 Gastro-esophageal reflux disease without esophagitis: Secondary | ICD-10-CM | POA: Diagnosis not present

## 2017-04-30 DIAGNOSIS — I10 Essential (primary) hypertension: Secondary | ICD-10-CM | POA: Diagnosis not present

## 2017-04-30 DIAGNOSIS — E785 Hyperlipidemia, unspecified: Secondary | ICD-10-CM | POA: Diagnosis not present

## 2017-04-30 DIAGNOSIS — Z9981 Dependence on supplemental oxygen: Secondary | ICD-10-CM | POA: Diagnosis not present

## 2017-04-30 LAB — STONE ANALYSIS
Ca Oxalate,Dihydrate: 30 %
Ca Oxalate,Monohydr.: 50 %
Ca phos cry stone ql IR: 20 %
Stone Weight KSTONE: 130.6 mg

## 2017-05-04 DIAGNOSIS — I252 Old myocardial infarction: Secondary | ICD-10-CM | POA: Diagnosis not present

## 2017-05-04 DIAGNOSIS — Z7902 Long term (current) use of antithrombotics/antiplatelets: Secondary | ICD-10-CM | POA: Diagnosis not present

## 2017-05-04 DIAGNOSIS — Z9981 Dependence on supplemental oxygen: Secondary | ICD-10-CM | POA: Diagnosis not present

## 2017-05-04 DIAGNOSIS — I10 Essential (primary) hypertension: Secondary | ICD-10-CM | POA: Diagnosis not present

## 2017-05-04 DIAGNOSIS — E785 Hyperlipidemia, unspecified: Secondary | ICD-10-CM | POA: Diagnosis not present

## 2017-05-04 DIAGNOSIS — Z7982 Long term (current) use of aspirin: Secondary | ICD-10-CM | POA: Diagnosis not present

## 2017-05-04 DIAGNOSIS — I2510 Atherosclerotic heart disease of native coronary artery without angina pectoris: Secondary | ICD-10-CM | POA: Diagnosis not present

## 2017-05-04 DIAGNOSIS — M810 Age-related osteoporosis without current pathological fracture: Secondary | ICD-10-CM | POA: Diagnosis not present

## 2017-05-04 DIAGNOSIS — E538 Deficiency of other specified B group vitamins: Secondary | ICD-10-CM | POA: Diagnosis not present

## 2017-05-04 DIAGNOSIS — K219 Gastro-esophageal reflux disease without esophagitis: Secondary | ICD-10-CM | POA: Diagnosis not present

## 2017-05-04 DIAGNOSIS — J439 Emphysema, unspecified: Secondary | ICD-10-CM | POA: Diagnosis not present

## 2017-05-05 DIAGNOSIS — K219 Gastro-esophageal reflux disease without esophagitis: Secondary | ICD-10-CM | POA: Diagnosis not present

## 2017-05-05 DIAGNOSIS — Z7902 Long term (current) use of antithrombotics/antiplatelets: Secondary | ICD-10-CM | POA: Diagnosis not present

## 2017-05-05 DIAGNOSIS — I252 Old myocardial infarction: Secondary | ICD-10-CM | POA: Diagnosis not present

## 2017-05-05 DIAGNOSIS — I2510 Atherosclerotic heart disease of native coronary artery without angina pectoris: Secondary | ICD-10-CM | POA: Diagnosis not present

## 2017-05-05 DIAGNOSIS — M810 Age-related osteoporosis without current pathological fracture: Secondary | ICD-10-CM | POA: Diagnosis not present

## 2017-05-05 DIAGNOSIS — E785 Hyperlipidemia, unspecified: Secondary | ICD-10-CM | POA: Diagnosis not present

## 2017-05-05 DIAGNOSIS — I10 Essential (primary) hypertension: Secondary | ICD-10-CM | POA: Diagnosis not present

## 2017-05-05 DIAGNOSIS — Z7982 Long term (current) use of aspirin: Secondary | ICD-10-CM | POA: Diagnosis not present

## 2017-05-05 DIAGNOSIS — E538 Deficiency of other specified B group vitamins: Secondary | ICD-10-CM | POA: Diagnosis not present

## 2017-05-05 DIAGNOSIS — Z9981 Dependence on supplemental oxygen: Secondary | ICD-10-CM | POA: Diagnosis not present

## 2017-05-05 DIAGNOSIS — J439 Emphysema, unspecified: Secondary | ICD-10-CM | POA: Diagnosis not present

## 2017-05-06 DIAGNOSIS — Z9981 Dependence on supplemental oxygen: Secondary | ICD-10-CM | POA: Diagnosis not present

## 2017-05-06 DIAGNOSIS — E785 Hyperlipidemia, unspecified: Secondary | ICD-10-CM | POA: Diagnosis not present

## 2017-05-06 DIAGNOSIS — M810 Age-related osteoporosis without current pathological fracture: Secondary | ICD-10-CM | POA: Diagnosis not present

## 2017-05-06 DIAGNOSIS — K219 Gastro-esophageal reflux disease without esophagitis: Secondary | ICD-10-CM | POA: Diagnosis not present

## 2017-05-06 DIAGNOSIS — I10 Essential (primary) hypertension: Secondary | ICD-10-CM | POA: Diagnosis not present

## 2017-05-06 DIAGNOSIS — Z7902 Long term (current) use of antithrombotics/antiplatelets: Secondary | ICD-10-CM | POA: Diagnosis not present

## 2017-05-06 DIAGNOSIS — I2510 Atherosclerotic heart disease of native coronary artery without angina pectoris: Secondary | ICD-10-CM | POA: Diagnosis not present

## 2017-05-06 DIAGNOSIS — E538 Deficiency of other specified B group vitamins: Secondary | ICD-10-CM | POA: Diagnosis not present

## 2017-05-06 DIAGNOSIS — J439 Emphysema, unspecified: Secondary | ICD-10-CM | POA: Diagnosis not present

## 2017-05-06 DIAGNOSIS — Z7982 Long term (current) use of aspirin: Secondary | ICD-10-CM | POA: Diagnosis not present

## 2017-05-06 DIAGNOSIS — I252 Old myocardial infarction: Secondary | ICD-10-CM | POA: Diagnosis not present

## 2017-05-07 ENCOUNTER — Encounter: Payer: Self-pay | Admitting: Urology

## 2017-05-07 ENCOUNTER — Ambulatory Visit (INDEPENDENT_AMBULATORY_CARE_PROVIDER_SITE_OTHER): Payer: Medicare Other | Admitting: Urology

## 2017-05-07 VITALS — BP 120/76 | HR 100 | Ht 67.0 in | Wt 155.0 lb

## 2017-05-07 DIAGNOSIS — J439 Emphysema, unspecified: Secondary | ICD-10-CM | POA: Diagnosis not present

## 2017-05-07 DIAGNOSIS — K219 Gastro-esophageal reflux disease without esophagitis: Secondary | ICD-10-CM | POA: Diagnosis not present

## 2017-05-07 DIAGNOSIS — I2510 Atherosclerotic heart disease of native coronary artery without angina pectoris: Secondary | ICD-10-CM | POA: Diagnosis not present

## 2017-05-07 DIAGNOSIS — M810 Age-related osteoporosis without current pathological fracture: Secondary | ICD-10-CM | POA: Diagnosis not present

## 2017-05-07 DIAGNOSIS — N200 Calculus of kidney: Secondary | ICD-10-CM

## 2017-05-07 DIAGNOSIS — I252 Old myocardial infarction: Secondary | ICD-10-CM | POA: Diagnosis not present

## 2017-05-07 DIAGNOSIS — E785 Hyperlipidemia, unspecified: Secondary | ICD-10-CM | POA: Diagnosis not present

## 2017-05-07 DIAGNOSIS — I10 Essential (primary) hypertension: Secondary | ICD-10-CM | POA: Diagnosis not present

## 2017-05-07 DIAGNOSIS — Z7902 Long term (current) use of antithrombotics/antiplatelets: Secondary | ICD-10-CM | POA: Diagnosis not present

## 2017-05-07 DIAGNOSIS — Z7982 Long term (current) use of aspirin: Secondary | ICD-10-CM | POA: Diagnosis not present

## 2017-05-07 DIAGNOSIS — Z9981 Dependence on supplemental oxygen: Secondary | ICD-10-CM | POA: Diagnosis not present

## 2017-05-07 DIAGNOSIS — E538 Deficiency of other specified B group vitamins: Secondary | ICD-10-CM | POA: Diagnosis not present

## 2017-05-07 LAB — URINALYSIS, COMPLETE
Bilirubin, UA: NEGATIVE
Glucose, UA: NEGATIVE
Ketones, UA: NEGATIVE
Nitrite, UA: NEGATIVE
PH UA: 6 (ref 5.0–7.5)
Specific Gravity, UA: 1.02 (ref 1.005–1.030)
UUROB: 0.2 mg/dL (ref 0.2–1.0)

## 2017-05-07 LAB — MICROSCOPIC EXAMINATION: Epithelial Cells (non renal): NONE SEEN /hpf (ref 0–10)

## 2017-05-07 MED ORDER — CIPROFLOXACIN HCL 500 MG PO TABS
500.0000 mg | ORAL_TABLET | Freq: Once | ORAL | Status: AC
  Administered 2017-05-07: 500 mg via ORAL

## 2017-05-07 MED ORDER — GENTAMICIN SULFATE 40 MG/ML IJ SOLN
80.0000 mg | Freq: Once | INTRAMUSCULAR | Status: AC
  Administered 2017-05-07: 80 mg via INTRAMUSCULAR

## 2017-05-07 MED ORDER — LIDOCAINE HCL 2 % EX GEL
1.0000 "application " | Freq: Once | CUTANEOUS | Status: AC
  Administered 2017-05-07: 1 via URETHRAL

## 2017-05-07 NOTE — Progress Notes (Signed)
   05/07/17  CC: No chief complaint on file.   HPI:  77 year old female with a large 16 mm renal pelvic stone status post left ureteroscopy on 04/28/2017.  The procedure was uncomplicated.  She returns the office today for cystoscopy, stent removal.  She reports that she is been tolerating the stent very well.  No significant change in her baseline urinary symptoms.  No fevers or chills.  NED. A&Ox3.   No respiratory distress   Abd soft, NT, ND Normal phallus with bilateral descended testicles  Cystoscopy/ Stent removal procedure  Patient identification was confirmed, informed consent was obtained, and patient was prepped using Betadine solution.  Lidocaine jelly was administered per urethral meatus.    Preoperative abx where received prior to procedure.    Procedure: - Flexible cystoscope introduced, without any difficulty.   - Thorough search of the bladder revealed:    normal urethral meatus  Stent seen emanating from left ureteral orifice, grasped with stent graspers, and removed in entirety.     Post-Procedure: - Patient tolerated the procedure well  Assessment/ Plan:  1. Kidney stone on left side Uncomplicated left ureteroscopy Stent removed today without difficulty Warning symptoms reviewed Follow-up in 4 weeks with renal ultrasound - Urinalysis, Complete   Return in about 4 weeks (around 06/04/2017) for RUS.   Hollice Espy, MD

## 2017-05-07 NOTE — Addendum Note (Signed)
Addended by: Tommy Rainwater on: 05/07/2017 03:48 PM   Modules accepted: Orders

## 2017-05-11 DIAGNOSIS — I2510 Atherosclerotic heart disease of native coronary artery without angina pectoris: Secondary | ICD-10-CM | POA: Diagnosis not present

## 2017-05-11 DIAGNOSIS — Z9981 Dependence on supplemental oxygen: Secondary | ICD-10-CM | POA: Diagnosis not present

## 2017-05-11 DIAGNOSIS — M810 Age-related osteoporosis without current pathological fracture: Secondary | ICD-10-CM | POA: Diagnosis not present

## 2017-05-11 DIAGNOSIS — E785 Hyperlipidemia, unspecified: Secondary | ICD-10-CM | POA: Diagnosis not present

## 2017-05-11 DIAGNOSIS — K219 Gastro-esophageal reflux disease without esophagitis: Secondary | ICD-10-CM | POA: Diagnosis not present

## 2017-05-11 DIAGNOSIS — Z7982 Long term (current) use of aspirin: Secondary | ICD-10-CM | POA: Diagnosis not present

## 2017-05-11 DIAGNOSIS — E538 Deficiency of other specified B group vitamins: Secondary | ICD-10-CM | POA: Diagnosis not present

## 2017-05-11 DIAGNOSIS — I10 Essential (primary) hypertension: Secondary | ICD-10-CM | POA: Diagnosis not present

## 2017-05-11 DIAGNOSIS — Z7902 Long term (current) use of antithrombotics/antiplatelets: Secondary | ICD-10-CM | POA: Diagnosis not present

## 2017-05-11 DIAGNOSIS — J439 Emphysema, unspecified: Secondary | ICD-10-CM | POA: Diagnosis not present

## 2017-05-11 DIAGNOSIS — I252 Old myocardial infarction: Secondary | ICD-10-CM | POA: Diagnosis not present

## 2017-05-12 DIAGNOSIS — Z9981 Dependence on supplemental oxygen: Secondary | ICD-10-CM | POA: Diagnosis not present

## 2017-05-12 DIAGNOSIS — E785 Hyperlipidemia, unspecified: Secondary | ICD-10-CM | POA: Diagnosis not present

## 2017-05-12 DIAGNOSIS — Z7902 Long term (current) use of antithrombotics/antiplatelets: Secondary | ICD-10-CM | POA: Diagnosis not present

## 2017-05-12 DIAGNOSIS — E538 Deficiency of other specified B group vitamins: Secondary | ICD-10-CM | POA: Diagnosis not present

## 2017-05-12 DIAGNOSIS — K219 Gastro-esophageal reflux disease without esophagitis: Secondary | ICD-10-CM | POA: Diagnosis not present

## 2017-05-12 DIAGNOSIS — I252 Old myocardial infarction: Secondary | ICD-10-CM | POA: Diagnosis not present

## 2017-05-12 DIAGNOSIS — J439 Emphysema, unspecified: Secondary | ICD-10-CM | POA: Diagnosis not present

## 2017-05-12 DIAGNOSIS — I2510 Atherosclerotic heart disease of native coronary artery without angina pectoris: Secondary | ICD-10-CM | POA: Diagnosis not present

## 2017-05-12 DIAGNOSIS — I10 Essential (primary) hypertension: Secondary | ICD-10-CM | POA: Diagnosis not present

## 2017-05-12 DIAGNOSIS — Z7982 Long term (current) use of aspirin: Secondary | ICD-10-CM | POA: Diagnosis not present

## 2017-05-12 DIAGNOSIS — M810 Age-related osteoporosis without current pathological fracture: Secondary | ICD-10-CM | POA: Diagnosis not present

## 2017-05-13 DIAGNOSIS — K219 Gastro-esophageal reflux disease without esophagitis: Secondary | ICD-10-CM | POA: Diagnosis not present

## 2017-05-13 DIAGNOSIS — Z7902 Long term (current) use of antithrombotics/antiplatelets: Secondary | ICD-10-CM | POA: Diagnosis not present

## 2017-05-13 DIAGNOSIS — E538 Deficiency of other specified B group vitamins: Secondary | ICD-10-CM | POA: Diagnosis not present

## 2017-05-13 DIAGNOSIS — E785 Hyperlipidemia, unspecified: Secondary | ICD-10-CM | POA: Diagnosis not present

## 2017-05-13 DIAGNOSIS — I10 Essential (primary) hypertension: Secondary | ICD-10-CM | POA: Diagnosis not present

## 2017-05-13 DIAGNOSIS — J439 Emphysema, unspecified: Secondary | ICD-10-CM | POA: Diagnosis not present

## 2017-05-13 DIAGNOSIS — M810 Age-related osteoporosis without current pathological fracture: Secondary | ICD-10-CM | POA: Diagnosis not present

## 2017-05-13 DIAGNOSIS — I252 Old myocardial infarction: Secondary | ICD-10-CM | POA: Diagnosis not present

## 2017-05-13 DIAGNOSIS — I2510 Atherosclerotic heart disease of native coronary artery without angina pectoris: Secondary | ICD-10-CM | POA: Diagnosis not present

## 2017-05-13 DIAGNOSIS — Z7982 Long term (current) use of aspirin: Secondary | ICD-10-CM | POA: Diagnosis not present

## 2017-05-13 DIAGNOSIS — Z9981 Dependence on supplemental oxygen: Secondary | ICD-10-CM | POA: Diagnosis not present

## 2017-05-14 DIAGNOSIS — I10 Essential (primary) hypertension: Secondary | ICD-10-CM | POA: Diagnosis not present

## 2017-05-14 DIAGNOSIS — E785 Hyperlipidemia, unspecified: Secondary | ICD-10-CM | POA: Diagnosis not present

## 2017-05-14 DIAGNOSIS — I2510 Atherosclerotic heart disease of native coronary artery without angina pectoris: Secondary | ICD-10-CM | POA: Diagnosis not present

## 2017-05-14 DIAGNOSIS — Z9981 Dependence on supplemental oxygen: Secondary | ICD-10-CM | POA: Diagnosis not present

## 2017-05-14 DIAGNOSIS — Z7902 Long term (current) use of antithrombotics/antiplatelets: Secondary | ICD-10-CM | POA: Diagnosis not present

## 2017-05-14 DIAGNOSIS — Z7982 Long term (current) use of aspirin: Secondary | ICD-10-CM | POA: Diagnosis not present

## 2017-05-14 DIAGNOSIS — K219 Gastro-esophageal reflux disease without esophagitis: Secondary | ICD-10-CM | POA: Diagnosis not present

## 2017-05-14 DIAGNOSIS — E538 Deficiency of other specified B group vitamins: Secondary | ICD-10-CM | POA: Diagnosis not present

## 2017-05-14 DIAGNOSIS — M810 Age-related osteoporosis without current pathological fracture: Secondary | ICD-10-CM | POA: Diagnosis not present

## 2017-05-14 DIAGNOSIS — I252 Old myocardial infarction: Secondary | ICD-10-CM | POA: Diagnosis not present

## 2017-05-14 DIAGNOSIS — J439 Emphysema, unspecified: Secondary | ICD-10-CM | POA: Diagnosis not present

## 2017-05-16 DIAGNOSIS — G4733 Obstructive sleep apnea (adult) (pediatric): Secondary | ICD-10-CM | POA: Diagnosis not present

## 2017-05-16 DIAGNOSIS — I509 Heart failure, unspecified: Secondary | ICD-10-CM | POA: Diagnosis not present

## 2017-05-16 DIAGNOSIS — J449 Chronic obstructive pulmonary disease, unspecified: Secondary | ICD-10-CM | POA: Diagnosis not present

## 2017-05-16 IMAGING — CR DG CHEST 2V
1 series · 2 of 2 positions shown · non-contrast
Comparison: Chest CT 12/24/2015. Chest x-ray 11/12/2014 and
03/25/2014.

CLINICAL DATA: Worsening shortness of breath for 4 days. Cough with
yellow sputum.

EXAM:
CHEST  2 VIEW

[Series 1: dg chest 2 view · 0.14mm/px · 2 of 2 slices shown]
[im 1/2]
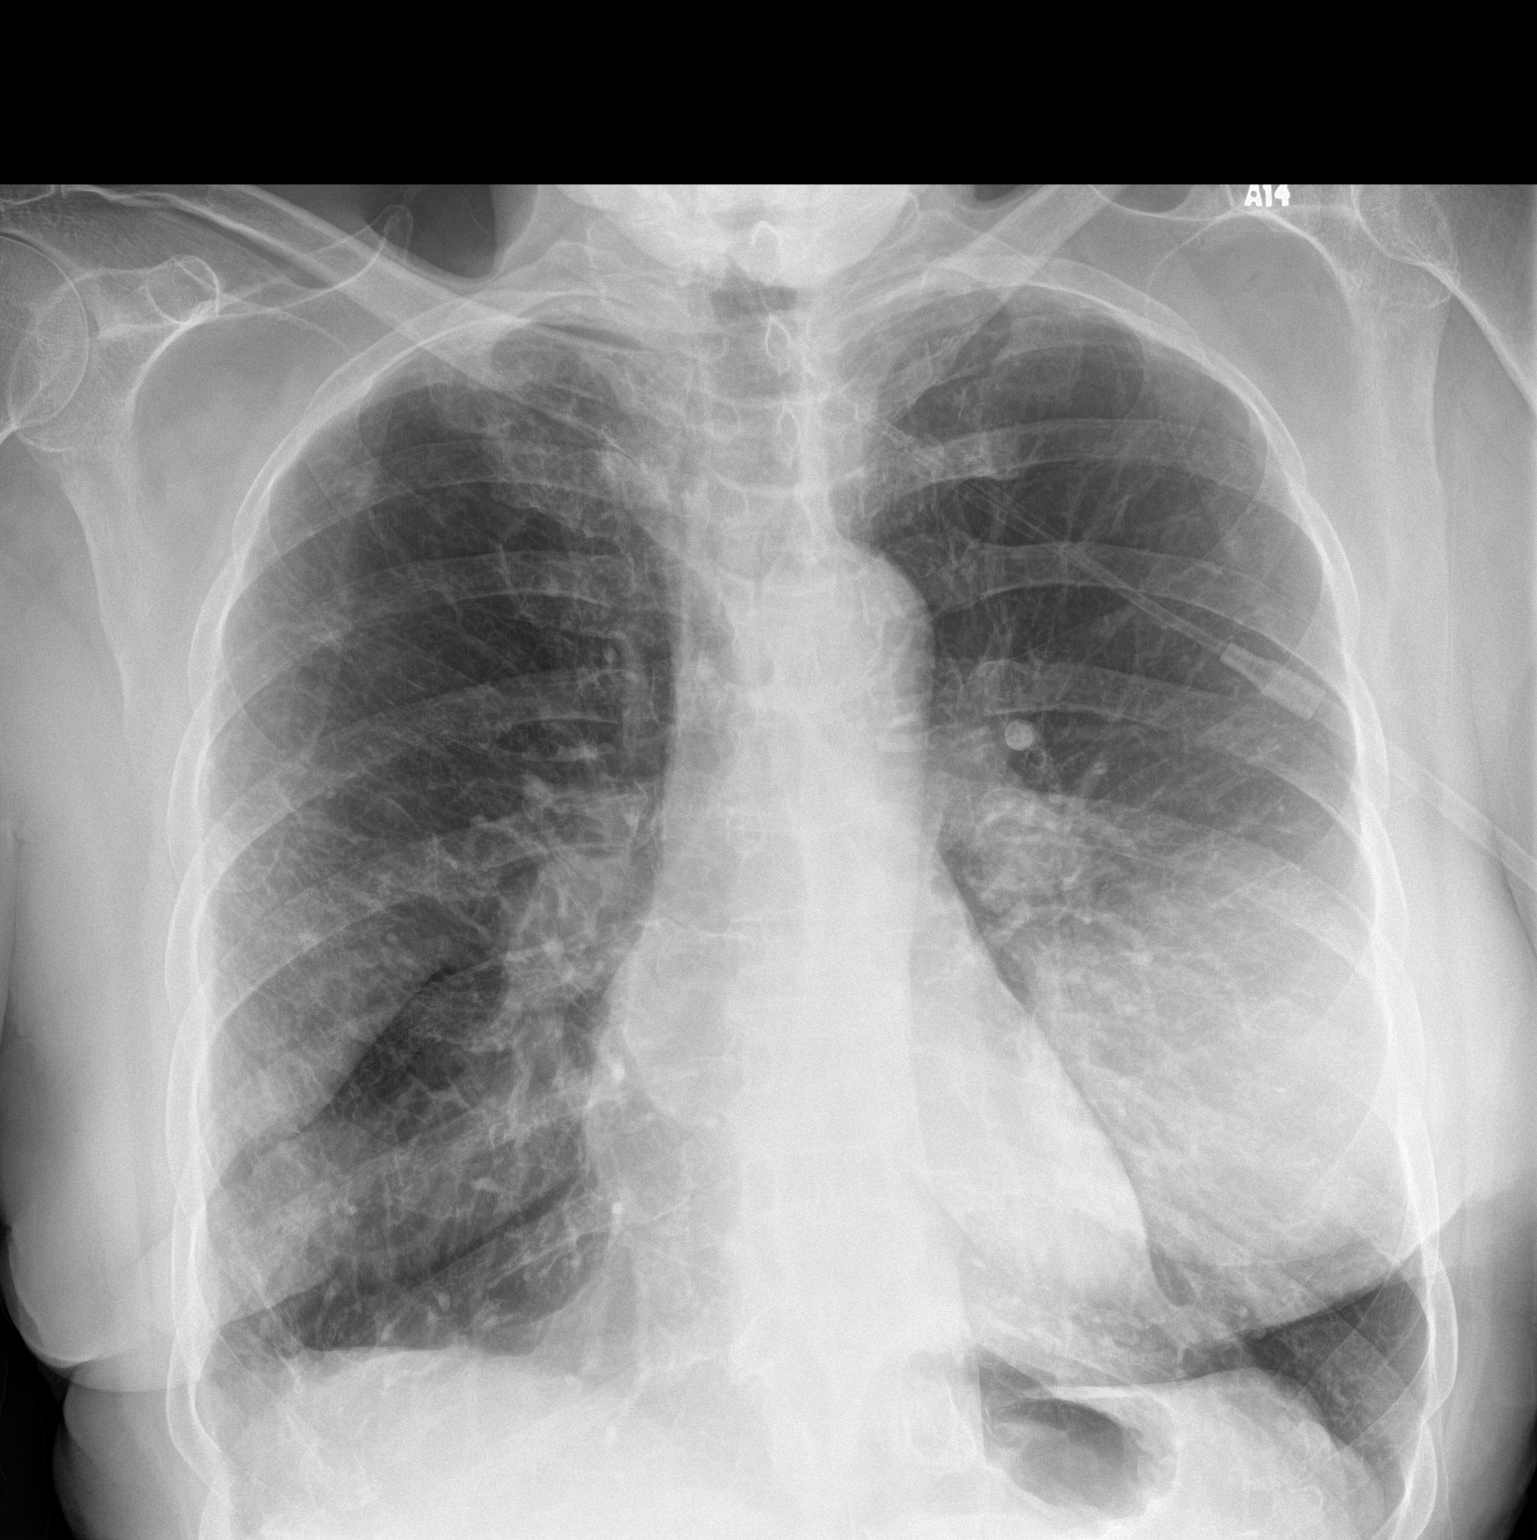
[im 2/2]
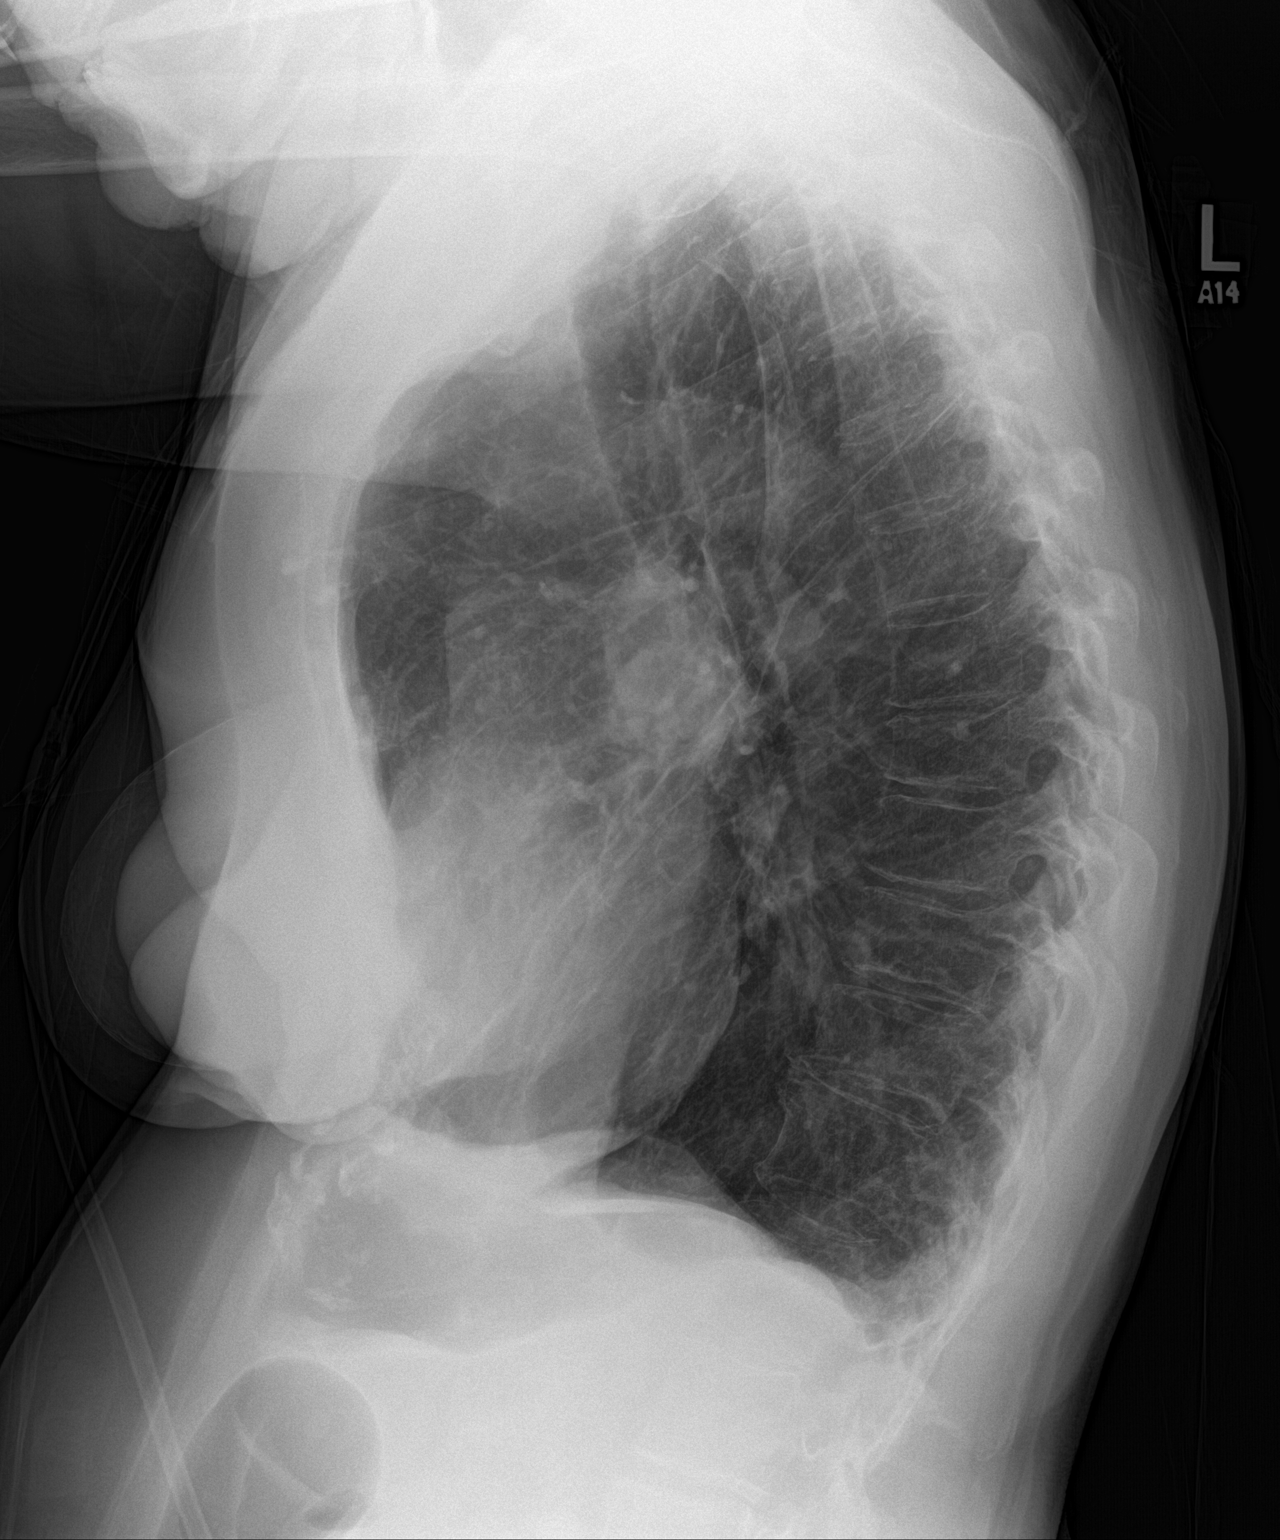

[2 of 2 positions shown; findings below may reference images not displayed]

FINDINGS: Marked changes of emphysema noted. Left breast prosthesis generates
hazy opacity over the left base. Architectural
distortion/interstitial scarring noted peripheral right lung.
Nodular densities peripheral right mid lung may reflect the tree in
bud nodularity seen in the right middle lobe on recent CT scan. No
evidence for pulmonary edema or pleural effusion. Bones are
diffusely demineralized.
IMPRESSION: Hyperinflation consistent with emphysema.

Architectural distortion with interstitial scarring peripheral right
lung with nodularity in the right mid lung potentially related to
changes seen on recent CT scan. Follow-up recommended to ensure
stability.

## 2017-05-18 DIAGNOSIS — E785 Hyperlipidemia, unspecified: Secondary | ICD-10-CM | POA: Diagnosis not present

## 2017-05-18 DIAGNOSIS — K219 Gastro-esophageal reflux disease without esophagitis: Secondary | ICD-10-CM | POA: Diagnosis not present

## 2017-05-18 DIAGNOSIS — Z9981 Dependence on supplemental oxygen: Secondary | ICD-10-CM | POA: Diagnosis not present

## 2017-05-18 DIAGNOSIS — I10 Essential (primary) hypertension: Secondary | ICD-10-CM | POA: Diagnosis not present

## 2017-05-18 DIAGNOSIS — Z7982 Long term (current) use of aspirin: Secondary | ICD-10-CM | POA: Diagnosis not present

## 2017-05-18 DIAGNOSIS — I2510 Atherosclerotic heart disease of native coronary artery without angina pectoris: Secondary | ICD-10-CM | POA: Diagnosis not present

## 2017-05-18 DIAGNOSIS — E538 Deficiency of other specified B group vitamins: Secondary | ICD-10-CM | POA: Diagnosis not present

## 2017-05-18 DIAGNOSIS — Z7902 Long term (current) use of antithrombotics/antiplatelets: Secondary | ICD-10-CM | POA: Diagnosis not present

## 2017-05-18 DIAGNOSIS — I252 Old myocardial infarction: Secondary | ICD-10-CM | POA: Diagnosis not present

## 2017-05-18 DIAGNOSIS — J439 Emphysema, unspecified: Secondary | ICD-10-CM | POA: Diagnosis not present

## 2017-05-18 DIAGNOSIS — M810 Age-related osteoporosis without current pathological fracture: Secondary | ICD-10-CM | POA: Diagnosis not present

## 2017-05-19 DIAGNOSIS — M810 Age-related osteoporosis without current pathological fracture: Secondary | ICD-10-CM | POA: Diagnosis not present

## 2017-05-19 DIAGNOSIS — Z7982 Long term (current) use of aspirin: Secondary | ICD-10-CM | POA: Diagnosis not present

## 2017-05-19 DIAGNOSIS — J439 Emphysema, unspecified: Secondary | ICD-10-CM | POA: Diagnosis not present

## 2017-05-19 DIAGNOSIS — Z9981 Dependence on supplemental oxygen: Secondary | ICD-10-CM | POA: Diagnosis not present

## 2017-05-19 DIAGNOSIS — K219 Gastro-esophageal reflux disease without esophagitis: Secondary | ICD-10-CM | POA: Diagnosis not present

## 2017-05-19 DIAGNOSIS — I10 Essential (primary) hypertension: Secondary | ICD-10-CM | POA: Diagnosis not present

## 2017-05-19 DIAGNOSIS — I252 Old myocardial infarction: Secondary | ICD-10-CM | POA: Diagnosis not present

## 2017-05-19 DIAGNOSIS — Z7902 Long term (current) use of antithrombotics/antiplatelets: Secondary | ICD-10-CM | POA: Diagnosis not present

## 2017-05-19 DIAGNOSIS — E785 Hyperlipidemia, unspecified: Secondary | ICD-10-CM | POA: Diagnosis not present

## 2017-05-19 DIAGNOSIS — I2510 Atherosclerotic heart disease of native coronary artery without angina pectoris: Secondary | ICD-10-CM | POA: Diagnosis not present

## 2017-05-19 DIAGNOSIS — E538 Deficiency of other specified B group vitamins: Secondary | ICD-10-CM | POA: Diagnosis not present

## 2017-05-20 ENCOUNTER — Telehealth: Payer: Self-pay | Admitting: Family Medicine

## 2017-05-20 DIAGNOSIS — J439 Emphysema, unspecified: Secondary | ICD-10-CM | POA: Diagnosis not present

## 2017-05-20 DIAGNOSIS — E538 Deficiency of other specified B group vitamins: Secondary | ICD-10-CM | POA: Diagnosis not present

## 2017-05-20 DIAGNOSIS — Z9981 Dependence on supplemental oxygen: Secondary | ICD-10-CM | POA: Diagnosis not present

## 2017-05-20 DIAGNOSIS — Z7902 Long term (current) use of antithrombotics/antiplatelets: Secondary | ICD-10-CM | POA: Diagnosis not present

## 2017-05-20 DIAGNOSIS — I252 Old myocardial infarction: Secondary | ICD-10-CM | POA: Diagnosis not present

## 2017-05-20 DIAGNOSIS — I10 Essential (primary) hypertension: Secondary | ICD-10-CM | POA: Diagnosis not present

## 2017-05-20 DIAGNOSIS — M810 Age-related osteoporosis without current pathological fracture: Secondary | ICD-10-CM | POA: Diagnosis not present

## 2017-05-20 DIAGNOSIS — K219 Gastro-esophageal reflux disease without esophagitis: Secondary | ICD-10-CM | POA: Diagnosis not present

## 2017-05-20 DIAGNOSIS — Z7982 Long term (current) use of aspirin: Secondary | ICD-10-CM | POA: Diagnosis not present

## 2017-05-20 DIAGNOSIS — I2510 Atherosclerotic heart disease of native coronary artery without angina pectoris: Secondary | ICD-10-CM | POA: Diagnosis not present

## 2017-05-20 DIAGNOSIS — E785 Hyperlipidemia, unspecified: Secondary | ICD-10-CM | POA: Diagnosis not present

## 2017-05-20 NOTE — Telephone Encounter (Signed)
Copied from [place] [id number]. Topic: Quick Communication - See Telephone Encounter >> May 20, 2017 11:44 AM Robina Ade, Helene Kelp D wrote: CRM for notification. See Telephone encounter for: 05/20/17. Marja Kays with [facility] called requesting verbal order for patient as follow: OT & Home therapy to be extended for 2X for 3 weeks. She can be reached at [phone].

## 2017-05-21 ENCOUNTER — Other Ambulatory Visit: Payer: Self-pay | Admitting: *Deleted

## 2017-05-21 NOTE — Telephone Encounter (Signed)
Kanie with [facility] advised.

## 2017-05-21 NOTE — Patient Outreach (Signed)
[facility]) Care Management  05/21/2017   Mckenzie Mclaughlin 15-Jul-1940 [id number]  RN Health Coach telephone call to patient.  [employer] compliance verified. Per patient she is doing pretty good. Patient states that she gets short of breath during her Occupational Therapy. RN explained this is expected. They are trying to help her increase her breathing capacity. Patient stated she is taking her medications as per ordered. RN reiterated the COPD zones. Per patient she has not received the packet sent by [facility]. RN will send packet again. Patient stated she is staying in house warm and dry. RN discussed purse lip breathing. Patient has not had any falls since last outreach.  Current Medications:  Current Outpatient Medications  Medication Sig Dispense Refill  . albuterol (PROAIR HFA) 108 (90 Base) MCG/ACT inhaler INHALE TWO PUFFS BY MOUTH EVERY 6 HOURS AS NEEDED (Patient taking differently: Inhale 2 puffs into the lungs 2 (two) times daily. ) 18 each 6  . ALPRAZolam (XANAX) 0.5 MG tablet TAKE 1 TABLET BY MOUTH 3 TIMES A DAY AS NEEDED (Patient taking differently: Take 0.5 mg by mouth 3 times daily) 90 tablet 1  . aspirin EC 81 MG tablet Take 81 mg by mouth daily.     Marland Kitchen atorvastatin (LIPITOR) 20 MG tablet Take 20 mg by mouth daily.    . budesonide-formoterol (SYMBICORT) 160-4.5 MCG/ACT inhaler Inhale 2 puffs into the lungs 2 (two) times daily. 1 Inhaler 5  . clopidogrel (PLAVIX) 75 MG tablet TAKE 1 TABLET BY MOUTH ONCE A DAY 30 tablet 12  . Cyanocobalamin (B-12 COMPLIANCE INJECTION IJ) Inject 1 Dose as directed every 30 (thirty) days.    Marland Kitchen diltiazem (CARDIZEM) 60 MG tablet Take 1 tablet (60 mg total) by mouth every 6 (six) hours. (Patient taking differently: Take 60 mg by mouth 3 (three) times daily. )    . docusate sodium (COLACE) 100 MG capsule Take 1 capsule (100 mg total) by mouth 2 (two) times daily. 60 capsule 0  . furosemide (LASIX) 40 MG tablet Take 1 tablet (40 mg  total) by mouth daily. 30 tablet 0  . HYDROcodone-acetaminophen (NORCO/VICODIN) 5-325 MG tablet Take 1-2 tablets by mouth every 6 (six) hours as needed for moderate pain. 10 tablet 0  . ipratropium-albuterol (DUONEB) 0.5-2.5 (3) MG/3ML SOLN Take 3 mLs by nebulization every 4 (four) hours as needed. (Patient taking differently: Take 3 mLs by nebulization every 4 (four) hours as needed (shortness of breath). ) 360 mL 0  . magnesium hydroxide (MILK OF MAGNESIA) 400 MG/5ML suspension Take 30 mLs by mouth daily. 360 mL 0  . metoprolol succinate (TOPROL-XL) 50 MG 24 hr tablet TAKE 1 TABLET BY MOUTH ONCE A DAY WITH OR IMMEDIATELY FOLLOWING A MEAL (Patient taking differently: TAKE 25 MG BY MOUTH ONCE A DAY WITH OR IMMEDIATELY FOLLOWING A MEAL) 30 tablet 12  . neomycin-bacitracin-polymyxin (NEOSPORIN) ointment Apply 1 application topically as needed for wound care.    . nitroGLYCERIN (NITROSTAT) 0.4 MG SL tablet Place 1 tablet (0.4 mg total) under the tongue every 5 (five) minutes as needed for chest pain (max 3 doses in 15 min). 25 tablet 2  . OXYGEN Inhale 2-3 L into the lungs continuous.    . ranolazine (RANEXA) 500 MG 12 hr tablet Take 1 tablet (500 mg total) by mouth 2 (two) times daily. 60 tablet 0  . sulfamethoxazole-trimethoprim (BACTRIM DS,SEPTRA DS) 800-160 MG tablet Take 1 tablet by mouth 2 (two) times daily. 14 tablet 0  .  tiotropium (SPIRIVA HANDIHALER) 18 MCG inhalation capsule Inhale 56mg once daily    . venlafaxine (EFFEXOR) 37.5 MG tablet Take 37.5 mg by mouth daily.     Current Facility-Administered Medications  Medication Dose Route Frequency Provider Last Rate Last Dose  . cyanocobalamin ((VITAMIN B-12)) injection 1,000 mcg  1,000 mcg Intramuscular Q30 days DTonia Ghent MD   1,000 mcg at 09/10/16 1621    Functional Status:  In your present state of health, do you have any difficulty performing the following activities: 05/21/2017 04/28/2017  Hearing? N N  Vision? N N  Comment - -   Difficulty concentrating or making decisions? N N  Walking or climbing stairs? Y Y  Comment - -  Dressing or bathing? N N  Doing errands, shopping? [place] ? N -  Using the Toilet? N -  In the past six months, have you accidently leaked urine? N -  Do you have problems with loss of bowel control? N -  Managing your Medications? N -  Managing your Finances? N -  Housekeeping or managing your Housekeeping? Y -  Some recent data might be hidden    Fall/Depression Screening: Fall Risk  05/21/2017 04/21/2017 04/05/2017  Falls in the past year? Yes Yes Yes  Number falls in past yr: _0 Injury with Fall? Yes Yes Yes  Risk Factor Category  High Fall Risk High Fall Risk High Fall Risk  Risk for fall due to : History of fall(s);Impaired mobility;Impaired balance/gait History of fall(s);Impaired balance/gait;Impaired mobility History of fall(s);Impaired mobility  Follow up Falls evaluation completed;Education provided;Falls prevention discussed Falls evaluation completed;Falls prevention discussed;Education provided Falls evaluation completed;Falls prevention discussed   PHQ 2/9 Scores 05/21/2017 04/21/2017 04/07/2017 12/05/2015 01/22/2015 08/10/2014 07/07/2013  PHQ - 2 Score _1 PHQ- 9 Score _2 - -   THN CM Care Plan Problem One     Most Recent Value  Care Plan Problem One  Knowledgement Deficit in Self Management of COPD  Role Documenting the Problem One  HUpper [place] Problem One  Active  THN Long Term Goal   Patient will not have any readmissions within the next 90 days  Interventions for Problem One Long Term Goal  RN reminded patient to keep appointments as per ordered. Patient reminded to take medication as per Dr ordered. RN discussed with patient about COPD zones and action plan. RN sent patient a 24 hour nurse contact magnet for her refrigerator.  RN will follow up outreach with further discussion and teach back                                                                           THN CM Short Term Goal #1   Patient will be able to verbalize green zone and action plan within the next 30 days  Interventions for Short Term Goal #1  RN discussed the zones and action plan of COPD. RN sent patient a refrigerator magnet with the zones of COPD and action plan. RN sent patient a COPD packet. RN will follow up with further discussion and teach back  THN CM Short Term Goal #2   Patient will not have any falls within the next 30 days  THN CM Short Term Goal #2 Met Date  05/21/17  Interventions for Short Term Goal #2  RN discussed fall prevention. RN sent EMMI educational material on falls prevention. RN will follow up with further discussion and teach back  THN CM Short Term Goal #3  Patient will verbalize understanding purse lip breathing  THN CM Short Term Goal #3 Start Date  05/21/17  Interventions for Short Tern Goal #3  RN discussed purselip breathing/ RN sent picture chart/ RN will follow up within the month of March       Assessment:  Patient is adhering to medications Occupational Therapy is working with patient Patient has productive cough of  Clear thick white sputum Patient will continue to benefit from [employer] telephonic outreach for education and support for COPD self management.  Plan:  RNre sent patient a calendar book to keep up with appointments RN discussed COPD zones and action plan RN resent COPD packet RN reseent patient a COPD magnet for refrigerator RN resent EMMI educational material on falls prevention RN resent 24 hr Nurse advise line magnet RN will follow up within the month of March RN sent educational material on purselip breathing  [facility] [phone]

## 2017-05-21 NOTE — Telephone Encounter (Signed)
Please give the order.  Thanks.   

## 2017-05-24 DIAGNOSIS — E785 Hyperlipidemia, unspecified: Secondary | ICD-10-CM | POA: Diagnosis not present

## 2017-05-24 DIAGNOSIS — M810 Age-related osteoporosis without current pathological fracture: Secondary | ICD-10-CM | POA: Diagnosis not present

## 2017-05-24 DIAGNOSIS — Z7902 Long term (current) use of antithrombotics/antiplatelets: Secondary | ICD-10-CM | POA: Diagnosis not present

## 2017-05-24 DIAGNOSIS — J439 Emphysema, unspecified: Secondary | ICD-10-CM | POA: Diagnosis not present

## 2017-05-24 DIAGNOSIS — I252 Old myocardial infarction: Secondary | ICD-10-CM | POA: Diagnosis not present

## 2017-05-24 DIAGNOSIS — I2510 Atherosclerotic heart disease of native coronary artery without angina pectoris: Secondary | ICD-10-CM | POA: Diagnosis not present

## 2017-05-24 DIAGNOSIS — E538 Deficiency of other specified B group vitamins: Secondary | ICD-10-CM | POA: Diagnosis not present

## 2017-05-24 DIAGNOSIS — Z7982 Long term (current) use of aspirin: Secondary | ICD-10-CM | POA: Diagnosis not present

## 2017-05-24 DIAGNOSIS — K219 Gastro-esophageal reflux disease without esophagitis: Secondary | ICD-10-CM | POA: Diagnosis not present

## 2017-05-24 DIAGNOSIS — Z9981 Dependence on supplemental oxygen: Secondary | ICD-10-CM | POA: Diagnosis not present

## 2017-05-24 DIAGNOSIS — I10 Essential (primary) hypertension: Secondary | ICD-10-CM | POA: Diagnosis not present

## 2017-05-25 DIAGNOSIS — M810 Age-related osteoporosis without current pathological fracture: Secondary | ICD-10-CM | POA: Diagnosis not present

## 2017-05-25 DIAGNOSIS — I509 Heart failure, unspecified: Secondary | ICD-10-CM | POA: Diagnosis not present

## 2017-05-25 DIAGNOSIS — I10 Essential (primary) hypertension: Secondary | ICD-10-CM | POA: Diagnosis not present

## 2017-05-25 DIAGNOSIS — J439 Emphysema, unspecified: Secondary | ICD-10-CM | POA: Diagnosis not present

## 2017-05-25 DIAGNOSIS — Z9981 Dependence on supplemental oxygen: Secondary | ICD-10-CM | POA: Diagnosis not present

## 2017-05-25 DIAGNOSIS — E785 Hyperlipidemia, unspecified: Secondary | ICD-10-CM | POA: Diagnosis not present

## 2017-05-25 DIAGNOSIS — I2510 Atherosclerotic heart disease of native coronary artery without angina pectoris: Secondary | ICD-10-CM | POA: Diagnosis not present

## 2017-05-25 DIAGNOSIS — G4733 Obstructive sleep apnea (adult) (pediatric): Secondary | ICD-10-CM | POA: Diagnosis not present

## 2017-05-25 DIAGNOSIS — Z7902 Long term (current) use of antithrombotics/antiplatelets: Secondary | ICD-10-CM | POA: Diagnosis not present

## 2017-05-25 DIAGNOSIS — K219 Gastro-esophageal reflux disease without esophagitis: Secondary | ICD-10-CM | POA: Diagnosis not present

## 2017-05-25 DIAGNOSIS — J449 Chronic obstructive pulmonary disease, unspecified: Secondary | ICD-10-CM | POA: Diagnosis not present

## 2017-05-25 DIAGNOSIS — E538 Deficiency of other specified B group vitamins: Secondary | ICD-10-CM | POA: Diagnosis not present

## 2017-05-25 DIAGNOSIS — Z7982 Long term (current) use of aspirin: Secondary | ICD-10-CM | POA: Diagnosis not present

## 2017-05-25 DIAGNOSIS — I252 Old myocardial infarction: Secondary | ICD-10-CM | POA: Diagnosis not present

## 2017-05-26 ENCOUNTER — Other Ambulatory Visit: Payer: Self-pay | Admitting: Family Medicine

## 2017-05-26 DIAGNOSIS — Z7902 Long term (current) use of antithrombotics/antiplatelets: Secondary | ICD-10-CM | POA: Diagnosis not present

## 2017-05-26 DIAGNOSIS — K219 Gastro-esophageal reflux disease without esophagitis: Secondary | ICD-10-CM | POA: Diagnosis not present

## 2017-05-26 DIAGNOSIS — E785 Hyperlipidemia, unspecified: Secondary | ICD-10-CM | POA: Diagnosis not present

## 2017-05-26 DIAGNOSIS — E538 Deficiency of other specified B group vitamins: Secondary | ICD-10-CM | POA: Diagnosis not present

## 2017-05-26 DIAGNOSIS — Z7982 Long term (current) use of aspirin: Secondary | ICD-10-CM | POA: Diagnosis not present

## 2017-05-26 DIAGNOSIS — M810 Age-related osteoporosis without current pathological fracture: Secondary | ICD-10-CM | POA: Diagnosis not present

## 2017-05-26 DIAGNOSIS — I2510 Atherosclerotic heart disease of native coronary artery without angina pectoris: Secondary | ICD-10-CM | POA: Diagnosis not present

## 2017-05-26 DIAGNOSIS — I252 Old myocardial infarction: Secondary | ICD-10-CM | POA: Diagnosis not present

## 2017-05-26 DIAGNOSIS — J439 Emphysema, unspecified: Secondary | ICD-10-CM | POA: Diagnosis not present

## 2017-05-26 DIAGNOSIS — I10 Essential (primary) hypertension: Secondary | ICD-10-CM | POA: Diagnosis not present

## 2017-05-26 DIAGNOSIS — Z9981 Dependence on supplemental oxygen: Secondary | ICD-10-CM | POA: Diagnosis not present

## 2017-05-26 NOTE — Telephone Encounter (Signed)
Electronic refill Last office visit 04/05/17/acute Last refill 04/02/17 #90/1

## 2017-05-27 DIAGNOSIS — I10 Essential (primary) hypertension: Secondary | ICD-10-CM | POA: Diagnosis not present

## 2017-05-27 DIAGNOSIS — I252 Old myocardial infarction: Secondary | ICD-10-CM | POA: Diagnosis not present

## 2017-05-27 DIAGNOSIS — I2510 Atherosclerotic heart disease of native coronary artery without angina pectoris: Secondary | ICD-10-CM | POA: Diagnosis not present

## 2017-05-27 DIAGNOSIS — Z7902 Long term (current) use of antithrombotics/antiplatelets: Secondary | ICD-10-CM | POA: Diagnosis not present

## 2017-05-27 DIAGNOSIS — Z9981 Dependence on supplemental oxygen: Secondary | ICD-10-CM | POA: Diagnosis not present

## 2017-05-27 DIAGNOSIS — J439 Emphysema, unspecified: Secondary | ICD-10-CM | POA: Diagnosis not present

## 2017-05-27 DIAGNOSIS — Z7982 Long term (current) use of aspirin: Secondary | ICD-10-CM | POA: Diagnosis not present

## 2017-05-27 DIAGNOSIS — E538 Deficiency of other specified B group vitamins: Secondary | ICD-10-CM | POA: Diagnosis not present

## 2017-05-27 DIAGNOSIS — K219 Gastro-esophageal reflux disease without esophagitis: Secondary | ICD-10-CM | POA: Diagnosis not present

## 2017-05-27 DIAGNOSIS — E785 Hyperlipidemia, unspecified: Secondary | ICD-10-CM | POA: Diagnosis not present

## 2017-05-27 DIAGNOSIS — M810 Age-related osteoporosis without current pathological fracture: Secondary | ICD-10-CM | POA: Diagnosis not present

## 2017-05-27 NOTE — Telephone Encounter (Signed)
Sent. Thanks.   

## 2017-05-28 ENCOUNTER — Telehealth: Payer: Self-pay | Admitting: Family Medicine

## 2017-05-28 NOTE — Telephone Encounter (Signed)
Left message on VM for [facility]

## 2017-05-28 NOTE — Telephone Encounter (Signed)
According to med list pt last got Vit B 12 injection on 04/05/17.Please advise.

## 2017-05-28 NOTE — Telephone Encounter (Signed)
Copied from [place] [phone]. Topic: Quick Communication - See Telephone Encounter >> May 28, 2017  1:50 PM Vernona Rieger wrote: CRM for notification. See Telephone encounter for:   05/28/17.  Cindy [facility] called and said that she has missed her B12 shot, she wants to know if her homehealth can give to her? Please call her @ [phone]

## 2017-05-28 NOTE — Telephone Encounter (Signed)
Yes, thanks.  Please call them back.   Please proceed but have them update [place] when done so we can track it.  Please make sure he hasn't gotten B12 done elsewhere in the last month.  Thanks.

## 2017-05-30 DIAGNOSIS — K219 Gastro-esophageal reflux disease without esophagitis: Secondary | ICD-10-CM | POA: Diagnosis not present

## 2017-05-30 DIAGNOSIS — M810 Age-related osteoporosis without current pathological fracture: Secondary | ICD-10-CM | POA: Diagnosis not present

## 2017-05-30 DIAGNOSIS — Z7902 Long term (current) use of antithrombotics/antiplatelets: Secondary | ICD-10-CM | POA: Diagnosis not present

## 2017-05-30 DIAGNOSIS — F419 Anxiety disorder, unspecified: Secondary | ICD-10-CM

## 2017-05-30 DIAGNOSIS — E329 Disease of thymus, unspecified: Secondary | ICD-10-CM | POA: Diagnosis not present

## 2017-05-30 DIAGNOSIS — Z9181 History of falling: Secondary | ICD-10-CM | POA: Diagnosis not present

## 2017-05-30 DIAGNOSIS — I252 Old myocardial infarction: Secondary | ICD-10-CM | POA: Diagnosis not present

## 2017-05-30 DIAGNOSIS — I2510 Atherosclerotic heart disease of native coronary artery without angina pectoris: Secondary | ICD-10-CM | POA: Diagnosis not present

## 2017-05-30 DIAGNOSIS — Z7982 Long term (current) use of aspirin: Secondary | ICD-10-CM | POA: Diagnosis not present

## 2017-05-30 DIAGNOSIS — E538 Deficiency of other specified B group vitamins: Secondary | ICD-10-CM | POA: Diagnosis not present

## 2017-05-30 DIAGNOSIS — Z9981 Dependence on supplemental oxygen: Secondary | ICD-10-CM | POA: Diagnosis not present

## 2017-05-30 DIAGNOSIS — I10 Essential (primary) hypertension: Secondary | ICD-10-CM | POA: Diagnosis not present

## 2017-05-30 DIAGNOSIS — E785 Hyperlipidemia, unspecified: Secondary | ICD-10-CM | POA: Diagnosis not present

## 2017-06-01 DIAGNOSIS — M810 Age-related osteoporosis without current pathological fracture: Secondary | ICD-10-CM | POA: Diagnosis not present

## 2017-06-01 DIAGNOSIS — Z7982 Long term (current) use of aspirin: Secondary | ICD-10-CM | POA: Diagnosis not present

## 2017-06-01 DIAGNOSIS — I252 Old myocardial infarction: Secondary | ICD-10-CM | POA: Diagnosis not present

## 2017-06-01 DIAGNOSIS — Z9981 Dependence on supplemental oxygen: Secondary | ICD-10-CM | POA: Diagnosis not present

## 2017-06-01 DIAGNOSIS — I2510 Atherosclerotic heart disease of native coronary artery without angina pectoris: Secondary | ICD-10-CM | POA: Diagnosis not present

## 2017-06-01 DIAGNOSIS — I10 Essential (primary) hypertension: Secondary | ICD-10-CM | POA: Diagnosis not present

## 2017-06-01 DIAGNOSIS — Z7902 Long term (current) use of antithrombotics/antiplatelets: Secondary | ICD-10-CM | POA: Diagnosis not present

## 2017-06-01 DIAGNOSIS — K219 Gastro-esophageal reflux disease without esophagitis: Secondary | ICD-10-CM | POA: Diagnosis not present

## 2017-06-01 DIAGNOSIS — E538 Deficiency of other specified B group vitamins: Secondary | ICD-10-CM | POA: Diagnosis not present

## 2017-06-01 DIAGNOSIS — J439 Emphysema, unspecified: Secondary | ICD-10-CM | POA: Diagnosis not present

## 2017-06-01 DIAGNOSIS — E785 Hyperlipidemia, unspecified: Secondary | ICD-10-CM | POA: Diagnosis not present

## 2017-06-02 DIAGNOSIS — I10 Essential (primary) hypertension: Secondary | ICD-10-CM | POA: Diagnosis not present

## 2017-06-02 DIAGNOSIS — Z9981 Dependence on supplemental oxygen: Secondary | ICD-10-CM | POA: Diagnosis not present

## 2017-06-02 DIAGNOSIS — M810 Age-related osteoporosis without current pathological fracture: Secondary | ICD-10-CM | POA: Diagnosis not present

## 2017-06-02 DIAGNOSIS — J439 Emphysema, unspecified: Secondary | ICD-10-CM | POA: Diagnosis not present

## 2017-06-02 DIAGNOSIS — E538 Deficiency of other specified B group vitamins: Secondary | ICD-10-CM | POA: Diagnosis not present

## 2017-06-02 DIAGNOSIS — Z7982 Long term (current) use of aspirin: Secondary | ICD-10-CM | POA: Diagnosis not present

## 2017-06-02 DIAGNOSIS — I252 Old myocardial infarction: Secondary | ICD-10-CM | POA: Diagnosis not present

## 2017-06-02 DIAGNOSIS — I2510 Atherosclerotic heart disease of native coronary artery without angina pectoris: Secondary | ICD-10-CM | POA: Diagnosis not present

## 2017-06-02 DIAGNOSIS — K219 Gastro-esophageal reflux disease without esophagitis: Secondary | ICD-10-CM | POA: Diagnosis not present

## 2017-06-02 DIAGNOSIS — E785 Hyperlipidemia, unspecified: Secondary | ICD-10-CM | POA: Diagnosis not present

## 2017-06-02 DIAGNOSIS — Z7902 Long term (current) use of antithrombotics/antiplatelets: Secondary | ICD-10-CM | POA: Diagnosis not present

## 2017-06-03 DIAGNOSIS — E785 Hyperlipidemia, unspecified: Secondary | ICD-10-CM | POA: Diagnosis not present

## 2017-06-03 DIAGNOSIS — I252 Old myocardial infarction: Secondary | ICD-10-CM | POA: Diagnosis not present

## 2017-06-03 DIAGNOSIS — Z9981 Dependence on supplemental oxygen: Secondary | ICD-10-CM | POA: Diagnosis not present

## 2017-06-03 DIAGNOSIS — I10 Essential (primary) hypertension: Secondary | ICD-10-CM | POA: Diagnosis not present

## 2017-06-03 DIAGNOSIS — Z7982 Long term (current) use of aspirin: Secondary | ICD-10-CM | POA: Diagnosis not present

## 2017-06-03 DIAGNOSIS — M810 Age-related osteoporosis without current pathological fracture: Secondary | ICD-10-CM | POA: Diagnosis not present

## 2017-06-03 DIAGNOSIS — E538 Deficiency of other specified B group vitamins: Secondary | ICD-10-CM | POA: Diagnosis not present

## 2017-06-03 DIAGNOSIS — Z7902 Long term (current) use of antithrombotics/antiplatelets: Secondary | ICD-10-CM | POA: Diagnosis not present

## 2017-06-03 DIAGNOSIS — J439 Emphysema, unspecified: Secondary | ICD-10-CM | POA: Diagnosis not present

## 2017-06-03 DIAGNOSIS — K219 Gastro-esophageal reflux disease without esophagitis: Secondary | ICD-10-CM | POA: Diagnosis not present

## 2017-06-03 DIAGNOSIS — I2510 Atherosclerotic heart disease of native coronary artery without angina pectoris: Secondary | ICD-10-CM | POA: Diagnosis not present

## 2017-06-04 ENCOUNTER — Ambulatory Visit
Admission: RE | Admit: 2017-06-04 | Discharge: 2017-06-04 | Disposition: A | Discharge status: Home / Self Care | Payer: Medicare Other | Source: Ambulatory Visit | Attending: Urology | Admitting: Urology

## 2017-06-04 DIAGNOSIS — N200 Calculus of kidney: Secondary | ICD-10-CM | POA: Diagnosis not present

## 2017-06-04 DIAGNOSIS — J439 Emphysema, unspecified: Secondary | ICD-10-CM | POA: Diagnosis not present

## 2017-06-04 DIAGNOSIS — Z7982 Long term (current) use of aspirin: Secondary | ICD-10-CM | POA: Diagnosis not present

## 2017-06-04 DIAGNOSIS — E785 Hyperlipidemia, unspecified: Secondary | ICD-10-CM | POA: Diagnosis not present

## 2017-06-04 DIAGNOSIS — Z9981 Dependence on supplemental oxygen: Secondary | ICD-10-CM | POA: Diagnosis not present

## 2017-06-04 DIAGNOSIS — M810 Age-related osteoporosis without current pathological fracture: Secondary | ICD-10-CM | POA: Diagnosis not present

## 2017-06-04 DIAGNOSIS — I2510 Atherosclerotic heart disease of native coronary artery without angina pectoris: Secondary | ICD-10-CM | POA: Diagnosis not present

## 2017-06-04 DIAGNOSIS — Z7902 Long term (current) use of antithrombotics/antiplatelets: Secondary | ICD-10-CM | POA: Diagnosis not present

## 2017-06-04 DIAGNOSIS — K219 Gastro-esophageal reflux disease without esophagitis: Secondary | ICD-10-CM | POA: Diagnosis not present

## 2017-06-04 DIAGNOSIS — I252 Old myocardial infarction: Secondary | ICD-10-CM | POA: Diagnosis not present

## 2017-06-04 DIAGNOSIS — E538 Deficiency of other specified B group vitamins: Secondary | ICD-10-CM | POA: Diagnosis not present

## 2017-06-04 DIAGNOSIS — I10 Essential (primary) hypertension: Secondary | ICD-10-CM | POA: Diagnosis not present

## 2017-06-07 ENCOUNTER — Telehealth: Payer: Self-pay | Admitting: Family Medicine

## 2017-06-07 DIAGNOSIS — I509 Heart failure, unspecified: Secondary | ICD-10-CM | POA: Diagnosis not present

## 2017-06-07 DIAGNOSIS — G4733 Obstructive sleep apnea (adult) (pediatric): Secondary | ICD-10-CM | POA: Diagnosis not present

## 2017-06-07 DIAGNOSIS — J449 Chronic obstructive pulmonary disease, unspecified: Secondary | ICD-10-CM | POA: Diagnosis not present

## 2017-06-07 NOTE — Telephone Encounter (Signed)
Please give the order.  Thanks.   

## 2017-06-07 NOTE — Telephone Encounter (Signed)
Left detailed message on voicemail.  

## 2017-06-07 NOTE — Telephone Encounter (Signed)
Copied from [place]. 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  Marlowe Kays Cb# [phone]

## 2017-06-07 NOTE — Telephone Encounter (Signed)
Copied from [place] [phone]. Topic: Quick Communication - See Telephone Encounter >> Jun 07, 2017  9:53 AM Burnis Medin, NT wrote: CRM for notification. See Telephone encounter for: Mckenzie Mclaughlin is calling to get verbal orders to continue PT for twice a week for 8 more weeks. She would like a call back.   06/07/17.

## 2017-06-08 ENCOUNTER — Encounter: Payer: Self-pay | Admitting: Urology

## 2017-06-08 ENCOUNTER — Ambulatory Visit: Payer: Medicare Other | Admitting: Urology

## 2017-06-08 VITALS — BP 144/69 | HR 63 | Ht 67.0 in | Wt 145.0 lb

## 2017-06-08 DIAGNOSIS — Z7982 Long term (current) use of aspirin: Secondary | ICD-10-CM | POA: Diagnosis not present

## 2017-06-08 DIAGNOSIS — E785 Hyperlipidemia, unspecified: Secondary | ICD-10-CM | POA: Diagnosis not present

## 2017-06-08 DIAGNOSIS — I2510 Atherosclerotic heart disease of native coronary artery without angina pectoris: Secondary | ICD-10-CM | POA: Diagnosis not present

## 2017-06-08 DIAGNOSIS — E538 Deficiency of other specified B group vitamins: Secondary | ICD-10-CM | POA: Diagnosis not present

## 2017-06-08 DIAGNOSIS — M810 Age-related osteoporosis without current pathological fracture: Secondary | ICD-10-CM | POA: Diagnosis not present

## 2017-06-08 DIAGNOSIS — I10 Essential (primary) hypertension: Secondary | ICD-10-CM | POA: Diagnosis not present

## 2017-06-08 DIAGNOSIS — Z87442 Personal history of urinary calculi: Secondary | ICD-10-CM | POA: Diagnosis not present

## 2017-06-08 DIAGNOSIS — N200 Calculus of kidney: Secondary | ICD-10-CM | POA: Diagnosis not present

## 2017-06-08 DIAGNOSIS — I252 Old myocardial infarction: Secondary | ICD-10-CM | POA: Diagnosis not present

## 2017-06-08 DIAGNOSIS — Z7902 Long term (current) use of antithrombotics/antiplatelets: Secondary | ICD-10-CM | POA: Diagnosis not present

## 2017-06-08 DIAGNOSIS — K219 Gastro-esophageal reflux disease without esophagitis: Secondary | ICD-10-CM | POA: Diagnosis not present

## 2017-06-08 DIAGNOSIS — Z9981 Dependence on supplemental oxygen: Secondary | ICD-10-CM | POA: Diagnosis not present

## 2017-06-08 DIAGNOSIS — J439 Emphysema, unspecified: Secondary | ICD-10-CM | POA: Diagnosis not present

## 2017-06-08 NOTE — Progress Notes (Signed)
06/08/2017 2:10 PM   KIMIKO COMMON Oct 04, 1940 [id number]  Referring provider: Tonia Ghent, MD [place]. [place], [place] [address]  Chief Complaint  Patient presents with  . Results    RUS 1 month    HPI: 77 year old female with a large 16 mm left renal pelvic stone who underwent uncomplicated left ureteroscopy on 04/28/2017.  Overall, she feels well.  No urinary issues including no dysuria/ hematuria.  No flank pain.    She follows up today with renal ultrasound which shows no residual stone burden and no evidence of hydronephrosis.  She does have cortical thinning bilaterally, right greater than left.  Stone analysis consistent with 30% calcium oxalate dihydrate, 50% calcium oxalate monohydrate, 20% calcium phosphate.  She does have a personal history of gross hematuria, kidney stones, and history of left renal pelvic abnormality on previous CT scans, likely secondary to chronic inflammation from her stone.  She had no ureteroscopy.  Never previously passed stones or required surgical intervention.   PMH: Past Medical History:  Diagnosis Date  . Anginal pain [facility])    see dr [name]  "Spams"  . Anxiety   . Arthritis   . Cancer (HCC)    side of head- skin cancer, squamous cell ca on left foot.  . Chronic airway obstruction (HCC)    chronic airway obstruction not elsewhere classified, unspecified  . Chronic obstructive asthma ([place])    unspecified  . Complication of anesthesia   . Constipation   . COPD (chronic obstructive pulmonary disease) ([place])   . COPD with emphysema ([place]) 01/05/2013  . COPD, severe 07/29/98  . Coronary artery disease 01/22/99   Non Q-wave MI, stent RCA  . Coronary atherosclerosis of native coronary artery 01/22/1999   status post stent RCA,   . Depression   . Dysrhythmia    Palpations at times. last time 10/29 ish 2014  . Esophageal dilatation    Pt needs appple sauce to take meds.  . Esophageal  stricture    PT needs apple sauce to take meds  . GERD (gastroesophageal reflux disease) 08/29/98   severe-no meds now  . Head injury, acute, with loss of consciousness ([place]) 11/2012   unsure how long  . History of blood transfusion   . History of blurry vision 05/06-05/10/2009   Hospital [facility] CP R/O'D Blurry vision, smoking  . History of CT scan of head 08/02/09   w/o mild age appropr atrophy  . History of ETT 08/03/09   Myoview Normal  . History of ETT 05/1999   Cardiolite pos ETT, neg Cardiolite  . History of ETT 09/02/01   Normal  . History of ETT 04/28/2006   Myoview normal EF 83%  . History of ETT 04/10/11   normal EF and no ischemia per Dr. Saralyn Pilar  . History of kidney stones 03/2017  . History of pneumothorax 01/05/2013  . Hx of cardiac catheterization 08/29/01   60% RCA 0/w 20-30% lesions  . Hx of cardiac catheterization 01/22/99   w/Stent 90% RCA lesion  . Hyperlipidemia, unspecified   . Hypertension    03/30/00  . Mental disorder   . On home oxygen therapy    as needed-has a travel tank  . Pneumothorax 2/14  . PONV (postoperative nausea and vomiting)    ad breast remocved and see was under anesthesia a long time  . Shortness of breath   . Status post aortic coarctation stent placement   .  Tobacco abuse    1/4 pack daily    Surgical History: Past Surgical History:  Procedure Laterality Date  . BACK SURGERY    . BREAST SURGERY     implant removal, R breast  . CARDIAC CATHETERIZATION    . CAROTID STENT Right 2000  . CATARACT EXTRACTION Bilateral    2016  . COLONOSCOPY    . CYSTOSCOPY W/ RETROGRADES Bilateral 04/28/2017   Procedure: CYSTOSCOPY WITH RETROGRADE PYELOGRAM;  Surgeon: Hollice Espy, MD;  Location: [facility] ORS;  Service: Urology;  Laterality: Bilateral;  . CYSTOSCOPY/URETEROSCOPY/HOLMIUM LASER/STENT PLACEMENT Left 04/28/2017   Procedure: CYSTOSCOPY/URETEROSCOPY/HOLMIUM LASER/STENT PLACEMENT;  Surgeon: Hollice Espy, MD;  Location: [facility] ORS;   Service: Urology;  Laterality: Left;  . ESOPHAGEAL DILATION  06/00   EGD  . LAMINECTOMY  1976   Disc Removal X 2 Lumbar  . MASTECTOMY  03/1976   Bilateral due FCBD with implants [facility])  . MASTECTOMY Left    due to fibrocystic breast disease  . OOPHORECTOMY    . TISSUE EXPANDER PLACEMENT Right 02/03/2013   Procedure: REMOVAL OF RIGHT BREAST IMPLANT AND IMPLANT MATERIAL  CAPSULECTOMY;  Surgeon: Irene Limbo, MD;  Location: [place];  Service: Plastics;  Laterality: Right;  . TOTAL ABDOMINAL HYSTERECTOMY W/ BILATERAL SALPINGOOPHORECTOMY      Home Medications:  Allergies as of 06/08/2017      Reactions   Amoxicillin Shortness Of Breath   Has patient had a PCN reaction causing immediate rash, facial/tongue/throat swelling, SOB or lightheadedness with hypotension: Yes Has patient had a PCN reaction causing severe rash involving mucus membranes or skin necrosis: No Has patient had a PCN reaction that required hospitalization: No Has patient had a PCN reaction occurring within the last 10 years: No If all of the above answers are "NO", then may proceed with Cephalosporin use.   Doxycycline Nausea Only, Other (See Comments)   Dizziness    Ibuprofen Nausea And Vomiting   Sertraline Hcl Nausea And Vomiting   REACTION: trembling      Medication List        Accurate as of 06/08/17  2:10 PM. Always use your most recent med list.          albuterol 108 (90 Base) MCG/ACT inhaler Commonly known as:  PROAIR HFA INHALE TWO PUFFS BY MOUTH EVERY 6 HOURS AS NEEDED   ALPRAZolam 0.5 MG tablet Commonly known as:  XANAX TAKE 1 TABLET BY MOUTH 3 TIMES A DAY AS NEEDED   aspirin EC 81 MG tablet Take 81 mg by mouth daily.   atorvastatin 20 MG tablet Commonly known as:  LIPITOR Take 20 mg by mouth daily.   B-12 COMPLIANCE INJECTION IJ Inject 1 Dose as directed every 30 (thirty) days.   budesonide-formoterol 160-4.5 MCG/ACT inhaler Commonly known as:  SYMBICORT Inhale 2 puffs into the  lungs 2 (two) times daily.   clopidogrel 75 MG tablet Commonly known as:  PLAVIX TAKE 1 TABLET BY MOUTH ONCE A DAY   diltiazem 60 MG tablet Commonly known as:  CARDIZEM Take 1 tablet (60 mg total) by mouth every 6 (six) hours.   docusate sodium 100 MG capsule Commonly known as:  COLACE Take 1 capsule (100 mg total) by mouth 2 (two) times daily.   furosemide 40 MG tablet Commonly known as:  LASIX Take 1 tablet (40 mg total) by mouth daily.   ipratropium-albuterol 0.5-2.5 (3) MG/3ML Soln Commonly known as:  DUONEB Take 3 mLs by nebulization every 4 (four) hours as needed.  magnesium hydroxide 400 MG/5ML suspension Commonly known as:  MILK OF MAGNESIA Take 30 mLs by mouth daily.   metoprolol succinate 50 MG 24 hr tablet Commonly known as:  TOPROL-XL TAKE 1 TABLET BY MOUTH ONCE A DAY WITH OR IMMEDIATELY FOLLOWING A MEAL   neomycin-bacitracin-polymyxin ointment Commonly known as:  NEOSPORIN Apply 1 application topically as needed for wound care.   nitroGLYCERIN 0.4 MG SL tablet Commonly known as:  NITROSTAT Place 1 tablet (0.4 mg total) under the tongue every 5 (five) minutes as needed for chest pain (max 3 doses in 15 min).   OXYGEN Inhale 2-3 L into the lungs continuous.   ranolazine 500 MG 12 hr tablet Commonly known as:  RANEXA Take 1 tablet (500 mg total) by mouth 2 (two) times daily.   SPIRIVA HANDIHALER 18 MCG inhalation capsule Generic drug:  tiotropium Inhale 34mcg once daily   venlafaxine 37.5 MG tablet Commonly known as:  EFFEXOR Take 37.5 mg by mouth daily.       Allergies:  Allergies  Allergen Reactions  . Amoxicillin Shortness Of Breath    Has patient had a PCN reaction causing immediate rash, facial/tongue/throat swelling, SOB or lightheadedness with hypotension: Yes Has patient had a PCN reaction causing severe rash involving mucus membranes or skin necrosis: No Has patient had a PCN reaction that required hospitalization: No Has patient had  a PCN reaction occurring within the last 10 years: No If all of the above answers are "NO", then may proceed with Cephalosporin use.   Marland Kitchen Doxycycline Nausea Only and Other (See Comments)    Dizziness   . Ibuprofen Nausea And Vomiting  . Sertraline Hcl Nausea And Vomiting    REACTION: trembling    Family History: Family History  Problem Relation Age of Onset  . Stroke Mother   . Heart disease Mother        MI  . Heart failure Mother   . Cancer Father        Lung  . Lung cancer Father   . COPD Brother   . Lung cancer Brother   . Heart disease Brother        CAD  . Heart disease Sister        cirrhosis due heart disease  . Cirrhosis Sister   . Coronary artery disease Sister   . Colon cancer Neg Hx   . Breast cancer Neg Hx     Social History:  reports that she quit smoking about 9 years ago. Her smoking use included cigarettes. She has a 79.50 pack-year smoking history. she has never used smokeless tobacco. She reports that she does not drink alcohol or use drugs.  ROS: UROLOGY Frequent Urination?: No Hard to postpone urination?: No Burning/pain with urination?: No Get up at night to urinate?: Yes Leakage of urine?: No Urine stream starts and stops?: No Trouble starting stream?: No Do you have to strain to urinate?: No Blood in urine?: No Urinary tract infection?: No Sexually transmitted disease?: No Injury to kidneys or bladder?: No Painful intercourse?: No Weak stream?: No Currently pregnant?: No Vaginal bleeding?: No Last menstrual period?: n  Gastrointestinal Nausea?: No Vomiting?: No Indigestion/heartburn?: No Diarrhea?: No Constipation?: No  Constitutional Fever: No Night sweats?: No Weight loss?: No Fatigue?: No  Skin Skin rash/lesions?: No Itching?: No  Eyes Blurred vision?: No Double vision?: No  Ears/Nose/Throat Sore throat?: No Sinus problems?: No  Hematologic/Lymphatic Swollen glands?: No Easy bruising?: No  Cardiovascular Leg  swelling?: Yes Chest pain?: No  Respiratory Cough?: No Shortness of breath?: Yes  Endocrine Excessive thirst?: No  Musculoskeletal Back pain?: No Joint pain?: No  Neurological Headaches?: No Dizziness?: No  Psychologic Depression?: No Anxiety?: Yes  Physical Exam: BP (!) 144/69   Pulse 63   Ht 5\' 7"  (1.702 m)   Wt 145 lb (65.8 kg)   BMI 22.71 kg/m   Constitutional:  Alert and oriented, No acute distress. HEENT: [place] AT, moist mucus membranes.  Trachea midline, no masses. Cardiovascular: No clubbing, cyanosis, or edema. Respiratory: Normal respiratory effort, no increased work of breathing. GI: Abdomen is soft, nontender, nondistended, no abdominal masses GU: No CVA tenderness. Skin: No rashes, bruises or suspicious lesions. Neurologic: Grossly intact, no focal deficits, moving all 4 extremities. Psychiatric: Normal mood and affect.  Laboratory Data: Lab Results  Component Value Date   WBC 9.3 04/20/2017   HGB 11.2 (L) 04/20/2017   HCT 33.2 (L) 04/20/2017   MCV 87.9 04/20/2017   PLT 378 04/20/2017    Lab Results  Component Value Date   CREATININE 1.06 (H) 04/20/2017    Lab Results  Component Value Date   HGBA1C 5.2 04/21/2011    Urinalysis N/a  Pertinent Imaging: CLINICAL DATA:  Left-sided kidney stone. History of lithotripsy and left renal stent.  EXAM: RENAL / URINARY TRACT ULTRASOUND COMPLETE  COMPARISON:  CT, 03/20/2017  FINDINGS: Right Kidney:  Length: 8.8 cm. Mild cortical thinning. Normal parenchymal echogenicity. No mass or stone. No hydronephrosis.  Left Kidney:  Length: 10.9 cm. Mild cortical thinning. Normal parenchymal echogenicity. No mass or stone. Stone seen within the left kidney on the prior CT are no longer visualized. No hydronephrosis.  Bladder:  Appears normal for degree of bladder distention.  IMPRESSION: 1. No renal stones or hydronephrosis. 2. Mild renal cortical thinning, right greater than left.  No other abnormality.   Electronically Signed   By: Lajean Manes M.D.   On: 06/04/2017 15:20  Renal ultrasound personally reviewed today.    Assessment & Plan:    1. History of kidney stones S/p uncomplicated left ureteroscopy No residual stone burden or hydronephrosis Reviewed stone composition We discussed general stone prevention techniques including drinking plenty water with goal of producing 2.5 L urine daily, increased citric acid intake, avoidance of high oxalate containing foods, and decreased salt intake.  Information about dietary recommendations given today.   Plan for KUB in 1 year  - DG Abd 1 View; Future  Hollice Espy, MD  [facility] [address] [place], [place] [address] [phone]

## 2017-06-09 DIAGNOSIS — Z7982 Long term (current) use of aspirin: Secondary | ICD-10-CM | POA: Diagnosis not present

## 2017-06-09 DIAGNOSIS — Z9981 Dependence on supplemental oxygen: Secondary | ICD-10-CM | POA: Diagnosis not present

## 2017-06-09 DIAGNOSIS — I252 Old myocardial infarction: Secondary | ICD-10-CM | POA: Diagnosis not present

## 2017-06-09 DIAGNOSIS — K219 Gastro-esophageal reflux disease without esophagitis: Secondary | ICD-10-CM | POA: Diagnosis not present

## 2017-06-09 DIAGNOSIS — Z7902 Long term (current) use of antithrombotics/antiplatelets: Secondary | ICD-10-CM | POA: Diagnosis not present

## 2017-06-09 DIAGNOSIS — E538 Deficiency of other specified B group vitamins: Secondary | ICD-10-CM | POA: Diagnosis not present

## 2017-06-09 DIAGNOSIS — M810 Age-related osteoporosis without current pathological fracture: Secondary | ICD-10-CM | POA: Diagnosis not present

## 2017-06-09 DIAGNOSIS — J439 Emphysema, unspecified: Secondary | ICD-10-CM | POA: Diagnosis not present

## 2017-06-09 DIAGNOSIS — I10 Essential (primary) hypertension: Secondary | ICD-10-CM | POA: Diagnosis not present

## 2017-06-09 DIAGNOSIS — E785 Hyperlipidemia, unspecified: Secondary | ICD-10-CM | POA: Diagnosis not present

## 2017-06-09 DIAGNOSIS — I2510 Atherosclerotic heart disease of native coronary artery without angina pectoris: Secondary | ICD-10-CM | POA: Diagnosis not present

## 2017-06-10 DIAGNOSIS — I25118 Atherosclerotic heart disease of native coronary artery with other forms of angina pectoris: Secondary | ICD-10-CM | POA: Diagnosis not present

## 2017-06-10 DIAGNOSIS — Z9981 Dependence on supplemental oxygen: Secondary | ICD-10-CM | POA: Diagnosis not present

## 2017-06-10 DIAGNOSIS — E538 Deficiency of other specified B group vitamins: Secondary | ICD-10-CM | POA: Diagnosis not present

## 2017-06-10 DIAGNOSIS — Z7902 Long term (current) use of antithrombotics/antiplatelets: Secondary | ICD-10-CM | POA: Diagnosis not present

## 2017-06-10 DIAGNOSIS — R531 Weakness: Secondary | ICD-10-CM | POA: Diagnosis not present

## 2017-06-10 DIAGNOSIS — J432 Centrilobular emphysema: Secondary | ICD-10-CM | POA: Diagnosis not present

## 2017-06-10 DIAGNOSIS — I10 Essential (primary) hypertension: Secondary | ICD-10-CM | POA: Diagnosis not present

## 2017-06-10 DIAGNOSIS — K219 Gastro-esophageal reflux disease without esophagitis: Secondary | ICD-10-CM | POA: Diagnosis not present

## 2017-06-10 DIAGNOSIS — M810 Age-related osteoporosis without current pathological fracture: Secondary | ICD-10-CM | POA: Diagnosis not present

## 2017-06-10 DIAGNOSIS — J439 Emphysema, unspecified: Secondary | ICD-10-CM | POA: Diagnosis not present

## 2017-06-10 DIAGNOSIS — E785 Hyperlipidemia, unspecified: Secondary | ICD-10-CM | POA: Diagnosis not present

## 2017-06-10 DIAGNOSIS — I493 Ventricular premature depolarization: Secondary | ICD-10-CM | POA: Diagnosis not present

## 2017-06-10 DIAGNOSIS — I252 Old myocardial infarction: Secondary | ICD-10-CM | POA: Diagnosis not present

## 2017-06-10 DIAGNOSIS — Z7982 Long term (current) use of aspirin: Secondary | ICD-10-CM | POA: Diagnosis not present

## 2017-06-10 DIAGNOSIS — I2510 Atherosclerotic heart disease of native coronary artery without angina pectoris: Secondary | ICD-10-CM | POA: Diagnosis not present

## 2017-06-13 DIAGNOSIS — J449 Chronic obstructive pulmonary disease, unspecified: Secondary | ICD-10-CM | POA: Diagnosis not present

## 2017-06-13 DIAGNOSIS — I509 Heart failure, unspecified: Secondary | ICD-10-CM | POA: Diagnosis not present

## 2017-06-13 DIAGNOSIS — G4733 Obstructive sleep apnea (adult) (pediatric): Secondary | ICD-10-CM | POA: Diagnosis not present

## 2017-06-14 DIAGNOSIS — Z7982 Long term (current) use of aspirin: Secondary | ICD-10-CM | POA: Diagnosis not present

## 2017-06-14 DIAGNOSIS — J439 Emphysema, unspecified: Secondary | ICD-10-CM | POA: Diagnosis not present

## 2017-06-14 DIAGNOSIS — I2510 Atherosclerotic heart disease of native coronary artery without angina pectoris: Secondary | ICD-10-CM | POA: Diagnosis not present

## 2017-06-14 DIAGNOSIS — I252 Old myocardial infarction: Secondary | ICD-10-CM | POA: Diagnosis not present

## 2017-06-14 DIAGNOSIS — I509 Heart failure, unspecified: Secondary | ICD-10-CM | POA: Diagnosis not present

## 2017-06-14 DIAGNOSIS — E538 Deficiency of other specified B group vitamins: Secondary | ICD-10-CM | POA: Diagnosis not present

## 2017-06-14 DIAGNOSIS — G4733 Obstructive sleep apnea (adult) (pediatric): Secondary | ICD-10-CM | POA: Diagnosis not present

## 2017-06-14 DIAGNOSIS — M810 Age-related osteoporosis without current pathological fracture: Secondary | ICD-10-CM | POA: Diagnosis not present

## 2017-06-14 DIAGNOSIS — I10 Essential (primary) hypertension: Secondary | ICD-10-CM | POA: Diagnosis not present

## 2017-06-14 DIAGNOSIS — Z9981 Dependence on supplemental oxygen: Secondary | ICD-10-CM | POA: Diagnosis not present

## 2017-06-14 DIAGNOSIS — J449 Chronic obstructive pulmonary disease, unspecified: Secondary | ICD-10-CM | POA: Diagnosis not present

## 2017-06-14 DIAGNOSIS — K219 Gastro-esophageal reflux disease without esophagitis: Secondary | ICD-10-CM | POA: Diagnosis not present

## 2017-06-14 DIAGNOSIS — E785 Hyperlipidemia, unspecified: Secondary | ICD-10-CM | POA: Diagnosis not present

## 2017-06-14 DIAGNOSIS — Z7902 Long term (current) use of antithrombotics/antiplatelets: Secondary | ICD-10-CM | POA: Diagnosis not present

## 2017-06-15 DIAGNOSIS — Z7982 Long term (current) use of aspirin: Secondary | ICD-10-CM | POA: Diagnosis not present

## 2017-06-15 DIAGNOSIS — E785 Hyperlipidemia, unspecified: Secondary | ICD-10-CM | POA: Diagnosis not present

## 2017-06-15 DIAGNOSIS — K219 Gastro-esophageal reflux disease without esophagitis: Secondary | ICD-10-CM | POA: Diagnosis not present

## 2017-06-15 DIAGNOSIS — I252 Old myocardial infarction: Secondary | ICD-10-CM | POA: Diagnosis not present

## 2017-06-15 DIAGNOSIS — M810 Age-related osteoporosis without current pathological fracture: Secondary | ICD-10-CM | POA: Diagnosis not present

## 2017-06-15 DIAGNOSIS — Z9981 Dependence on supplemental oxygen: Secondary | ICD-10-CM | POA: Diagnosis not present

## 2017-06-15 DIAGNOSIS — E538 Deficiency of other specified B group vitamins: Secondary | ICD-10-CM | POA: Diagnosis not present

## 2017-06-15 DIAGNOSIS — I2510 Atherosclerotic heart disease of native coronary artery without angina pectoris: Secondary | ICD-10-CM | POA: Diagnosis not present

## 2017-06-15 DIAGNOSIS — I10 Essential (primary) hypertension: Secondary | ICD-10-CM | POA: Diagnosis not present

## 2017-06-15 DIAGNOSIS — Z7902 Long term (current) use of antithrombotics/antiplatelets: Secondary | ICD-10-CM | POA: Diagnosis not present

## 2017-06-15 DIAGNOSIS — J439 Emphysema, unspecified: Secondary | ICD-10-CM | POA: Diagnosis not present

## 2017-06-16 DIAGNOSIS — I2510 Atherosclerotic heart disease of native coronary artery without angina pectoris: Secondary | ICD-10-CM | POA: Diagnosis not present

## 2017-06-16 DIAGNOSIS — M810 Age-related osteoporosis without current pathological fracture: Secondary | ICD-10-CM | POA: Diagnosis not present

## 2017-06-16 DIAGNOSIS — I252 Old myocardial infarction: Secondary | ICD-10-CM | POA: Diagnosis not present

## 2017-06-16 DIAGNOSIS — K219 Gastro-esophageal reflux disease without esophagitis: Secondary | ICD-10-CM | POA: Diagnosis not present

## 2017-06-16 DIAGNOSIS — I10 Essential (primary) hypertension: Secondary | ICD-10-CM | POA: Diagnosis not present

## 2017-06-16 DIAGNOSIS — E538 Deficiency of other specified B group vitamins: Secondary | ICD-10-CM | POA: Diagnosis not present

## 2017-06-16 DIAGNOSIS — Z7982 Long term (current) use of aspirin: Secondary | ICD-10-CM | POA: Diagnosis not present

## 2017-06-16 DIAGNOSIS — Z9981 Dependence on supplemental oxygen: Secondary | ICD-10-CM | POA: Diagnosis not present

## 2017-06-16 DIAGNOSIS — J439 Emphysema, unspecified: Secondary | ICD-10-CM | POA: Diagnosis not present

## 2017-06-16 DIAGNOSIS — Z7902 Long term (current) use of antithrombotics/antiplatelets: Secondary | ICD-10-CM | POA: Diagnosis not present

## 2017-06-16 DIAGNOSIS — E785 Hyperlipidemia, unspecified: Secondary | ICD-10-CM | POA: Diagnosis not present

## 2017-06-17 ENCOUNTER — Ambulatory Visit: Payer: Self-pay | Admitting: *Deleted

## 2017-06-17 DIAGNOSIS — I10 Essential (primary) hypertension: Secondary | ICD-10-CM | POA: Diagnosis not present

## 2017-06-17 DIAGNOSIS — J439 Emphysema, unspecified: Secondary | ICD-10-CM | POA: Diagnosis not present

## 2017-06-17 DIAGNOSIS — I252 Old myocardial infarction: Secondary | ICD-10-CM | POA: Diagnosis not present

## 2017-06-17 DIAGNOSIS — M810 Age-related osteoporosis without current pathological fracture: Secondary | ICD-10-CM | POA: Diagnosis not present

## 2017-06-17 DIAGNOSIS — Z7982 Long term (current) use of aspirin: Secondary | ICD-10-CM | POA: Diagnosis not present

## 2017-06-17 DIAGNOSIS — Z9981 Dependence on supplemental oxygen: Secondary | ICD-10-CM | POA: Diagnosis not present

## 2017-06-17 DIAGNOSIS — Z7902 Long term (current) use of antithrombotics/antiplatelets: Secondary | ICD-10-CM | POA: Diagnosis not present

## 2017-06-17 DIAGNOSIS — K219 Gastro-esophageal reflux disease without esophagitis: Secondary | ICD-10-CM | POA: Diagnosis not present

## 2017-06-17 DIAGNOSIS — I2510 Atherosclerotic heart disease of native coronary artery without angina pectoris: Secondary | ICD-10-CM | POA: Diagnosis not present

## 2017-06-17 DIAGNOSIS — E538 Deficiency of other specified B group vitamins: Secondary | ICD-10-CM | POA: Diagnosis not present

## 2017-06-17 DIAGNOSIS — E785 Hyperlipidemia, unspecified: Secondary | ICD-10-CM | POA: Diagnosis not present

## 2017-06-18 ENCOUNTER — Ambulatory Visit (INDEPENDENT_AMBULATORY_CARE_PROVIDER_SITE_OTHER): Payer: Medicare Other | Admitting: Family Medicine

## 2017-06-18 ENCOUNTER — Encounter: Payer: Self-pay | Admitting: Family Medicine

## 2017-06-18 VITALS — BP 138/62 | HR 61 | Temp 97.9°F | Wt 150.5 lb

## 2017-06-18 DIAGNOSIS — F419 Anxiety disorder, unspecified: Secondary | ICD-10-CM

## 2017-06-18 DIAGNOSIS — Y92009 Unspecified place in unspecified non-institutional (private) residence as the place of occurrence of the external cause: Secondary | ICD-10-CM

## 2017-06-18 DIAGNOSIS — N200 Calculus of kidney: Secondary | ICD-10-CM

## 2017-06-18 DIAGNOSIS — W19XXXA Unspecified fall, initial encounter: Secondary | ICD-10-CM | POA: Diagnosis not present

## 2017-06-18 DIAGNOSIS — J449 Chronic obstructive pulmonary disease, unspecified: Secondary | ICD-10-CM

## 2017-06-18 DIAGNOSIS — F329 Major depressive disorder, single episode, unspecified: Secondary | ICD-10-CM | POA: Diagnosis not present

## 2017-06-18 DIAGNOSIS — F32A Depression, unspecified: Secondary | ICD-10-CM

## 2017-06-18 LAB — CBC WITH DIFFERENTIAL/PLATELET
BASOS ABS: 0.1 10*3/uL (ref 0.0–0.1)
BASOS PCT: 0.6 % (ref 0.0–3.0)
EOS PCT: 1 % (ref 0.0–5.0)
Eosinophils Absolute: 0.1 10*3/uL (ref 0.0–0.7)
HEMATOCRIT: 38.4 % (ref 36.0–46.0)
Hemoglobin: 12.5 g/dL (ref 12.0–15.0)
LYMPHS ABS: 1.5 10*3/uL (ref 0.7–4.0)
LYMPHS PCT: 16.9 % (ref 12.0–46.0)
MCHC: 32.5 g/dL (ref 30.0–36.0)
MCV: 90.3 fl (ref 78.0–100.0)
MONOS PCT: 7.1 % (ref 3.0–12.0)
Monocytes Absolute: 0.6 10*3/uL (ref 0.1–1.0)
NEUTROS ABS: 6.5 10*3/uL (ref 1.4–7.7)
NEUTROS PCT: 74.4 % (ref 43.0–77.0)
PLATELETS: 280 10*3/uL (ref 150.0–400.0)
RBC: 4.26 Mil/uL (ref 3.87–5.11)
RDW: 14.8 % (ref 11.5–15.5)
WBC: 8.8 10*3/uL (ref 4.0–10.5)

## 2017-06-18 LAB — COMPREHENSIVE METABOLIC PANEL
ALT: 11 U/L (ref 0–35)
AST: 17 U/L (ref 0–37)
Albumin: 3.7 g/dL (ref 3.5–5.2)
Alkaline Phosphatase: 74 U/L (ref 39–117)
BILIRUBIN TOTAL: 0.4 mg/dL (ref 0.2–1.2)
BUN: 16 mg/dL (ref 6–23)
CALCIUM: 10.1 mg/dL (ref 8.4–10.5)
CO2: 34 meq/L — AB (ref 19–32)
Chloride: 102 mEq/L (ref 96–112)
Creatinine, Ser: 1.03 mg/dL (ref 0.40–1.20)
GFR: 55.63 mL/min — AB (ref >60.00)
Glucose, Bld: 119 mg/dL — ABNORMAL HIGH (ref 70–99)
POTASSIUM: 3.6 meq/L (ref 3.5–5.1)
Sodium: 142 mEq/L (ref 135–145)
Total Protein: 6.9 g/dL (ref 6.0–8.3)

## 2017-06-18 MED ORDER — ALBUTEROL SULFATE HFA 108 (90 BASE) MCG/ACT IN AERS: 2.0000 | INHALATION_SPRAY | Freq: Four times a day (QID) | RESPIRATORY_TRACT | Status: DC | PRN

## 2017-06-18 MED ORDER — METOPROLOL SUCCINATE ER 50 MG PO TB24: 50.0000 mg | ORAL_TABLET | Freq: Every day | ORAL | Status: DC

## 2017-06-18 MED ORDER — DILTIAZEM HCL 60 MG PO TABS: 60.0000 mg | ORAL_TABLET | Freq: Three times a day (TID) | ORAL | Status: DC

## 2017-06-18 MED ORDER — IPRATROPIUM-ALBUTEROL 0.5-2.5 (3) MG/3ML IN SOLN: 3.0000 mL | RESPIRATORY_TRACT | Status: AC | PRN

## 2017-06-18 NOTE — Patient Instructions (Signed)
Use ice for 5 minutes at a time on either knee.  Make sure to have a cloth between the ice and your skin.  Take tylenol as needed.  Keep going with PT for your legs.  Go to the lab on the way out.  We'll contact you with your lab report. Take care.  Glad to see you.  Update me as needed.

## 2017-06-18 NOTE — Progress Notes (Signed)
She has had falls w/o syncope.  She feels the fall when it is able to happen.   Per cards, the patient has had recent falling episodes, without apparent presyncope or syncope.  She doesn't have vertigo.  She doesn't feel stong in general.  She is O2 dependent.  No NTG use.  Fell on B knees.  Some pain with walking.  She is still in PT at home and they are working on her leg strength.     She had uro treatment for renal stones.  She isn't having pain.  Urination is okay per patient report.  No blood in urine.  She'll have yearly urology f/u, sooner if needed.   Anxiety, exacerbated by COPD.  Using BZD TID with relief of anxiety.  Still on venlafaxine w/o ADE.    COPD.  "I'm getting by."  On O2 at baseline. Some sputum, most days, white sputum. No fevers. Still on baseline inhalers.    PMH and SH reviewed  ROS: Per HPI unless specifically indicated in ROS section   Meds, vitals, and allergies reviewed.   nad ncat Chronically ill-appearing elderly lady on oxygen in no apparent distress.  Speaking in complete sentences. MMM Neck supple, no LA rrr Global but not focal dec in BS, no wheeze noted.   abd soft, not ttp B patella ttp- mild- with some faint bruising and abrasion B.  B knee joint lines not ttp o/w.  Able to bear weight.

## 2017-06-20 DIAGNOSIS — N200 Calculus of kidney: Secondary | ICD-10-CM | POA: Insufficient documentation

## 2017-06-20 NOTE — Assessment & Plan Note (Signed)
Status post treatment from urology.  I will defer.

## 2017-06-20 NOTE — Assessment & Plan Note (Signed)
Discussed with patient about routine fall cautions.  Continue physical therapy.  She does not have syncope.  She does not have vertigo.  She is likely deconditioned and it makes sense to continue with physical therapy.  Discussed.  She agrees. >25 minutes spent in face to face time with patient, >50% spent in counselling or coordination of care.

## 2017-06-20 NOTE — Assessment & Plan Note (Signed)
Continue current inhalers.  Continue oxygen.  Her husband still smokes at home.  He is not interested in quitting.  Patient does not smoke.  She does not have focal decrease in breath sounds at this point.

## 2017-06-20 NOTE — Assessment & Plan Note (Signed)
Anxiety, exacerbated by COPD.  Using BZD TID with relief of anxiety.  Still on venlafaxine w/o ADE.   Would continue current medications.  She agrees.

## 2017-06-21 ENCOUNTER — Ambulatory Visit: Payer: Medicare Other | Admitting: Family Medicine

## 2017-06-21 DIAGNOSIS — I509 Heart failure, unspecified: Secondary | ICD-10-CM | POA: Diagnosis not present

## 2017-06-21 DIAGNOSIS — J439 Emphysema, unspecified: Secondary | ICD-10-CM | POA: Diagnosis not present

## 2017-06-21 DIAGNOSIS — E538 Deficiency of other specified B group vitamins: Secondary | ICD-10-CM | POA: Diagnosis not present

## 2017-06-21 DIAGNOSIS — Z7982 Long term (current) use of aspirin: Secondary | ICD-10-CM | POA: Diagnosis not present

## 2017-06-21 DIAGNOSIS — M810 Age-related osteoporosis without current pathological fracture: Secondary | ICD-10-CM | POA: Diagnosis not present

## 2017-06-21 DIAGNOSIS — G4733 Obstructive sleep apnea (adult) (pediatric): Secondary | ICD-10-CM | POA: Diagnosis not present

## 2017-06-21 DIAGNOSIS — I10 Essential (primary) hypertension: Secondary | ICD-10-CM | POA: Diagnosis not present

## 2017-06-21 DIAGNOSIS — K219 Gastro-esophageal reflux disease without esophagitis: Secondary | ICD-10-CM | POA: Diagnosis not present

## 2017-06-21 DIAGNOSIS — Z7902 Long term (current) use of antithrombotics/antiplatelets: Secondary | ICD-10-CM | POA: Diagnosis not present

## 2017-06-21 DIAGNOSIS — I2510 Atherosclerotic heart disease of native coronary artery without angina pectoris: Secondary | ICD-10-CM | POA: Diagnosis not present

## 2017-06-21 DIAGNOSIS — Z9981 Dependence on supplemental oxygen: Secondary | ICD-10-CM | POA: Diagnosis not present

## 2017-06-21 DIAGNOSIS — I252 Old myocardial infarction: Secondary | ICD-10-CM | POA: Diagnosis not present

## 2017-06-21 DIAGNOSIS — E785 Hyperlipidemia, unspecified: Secondary | ICD-10-CM | POA: Diagnosis not present

## 2017-06-21 DIAGNOSIS — J449 Chronic obstructive pulmonary disease, unspecified: Secondary | ICD-10-CM | POA: Diagnosis not present

## 2017-06-22 DIAGNOSIS — I2510 Atherosclerotic heart disease of native coronary artery without angina pectoris: Secondary | ICD-10-CM | POA: Diagnosis not present

## 2017-06-22 DIAGNOSIS — Z7902 Long term (current) use of antithrombotics/antiplatelets: Secondary | ICD-10-CM | POA: Diagnosis not present

## 2017-06-22 DIAGNOSIS — I10 Essential (primary) hypertension: Secondary | ICD-10-CM | POA: Diagnosis not present

## 2017-06-22 DIAGNOSIS — Z9981 Dependence on supplemental oxygen: Secondary | ICD-10-CM | POA: Diagnosis not present

## 2017-06-22 DIAGNOSIS — J439 Emphysema, unspecified: Secondary | ICD-10-CM | POA: Diagnosis not present

## 2017-06-22 DIAGNOSIS — K219 Gastro-esophageal reflux disease without esophagitis: Secondary | ICD-10-CM | POA: Diagnosis not present

## 2017-06-22 DIAGNOSIS — G4733 Obstructive sleep apnea (adult) (pediatric): Secondary | ICD-10-CM | POA: Diagnosis not present

## 2017-06-22 DIAGNOSIS — I252 Old myocardial infarction: Secondary | ICD-10-CM | POA: Diagnosis not present

## 2017-06-22 DIAGNOSIS — Z7982 Long term (current) use of aspirin: Secondary | ICD-10-CM | POA: Diagnosis not present

## 2017-06-22 DIAGNOSIS — E785 Hyperlipidemia, unspecified: Secondary | ICD-10-CM | POA: Diagnosis not present

## 2017-06-22 DIAGNOSIS — E538 Deficiency of other specified B group vitamins: Secondary | ICD-10-CM | POA: Diagnosis not present

## 2017-06-22 DIAGNOSIS — I509 Heart failure, unspecified: Secondary | ICD-10-CM | POA: Diagnosis not present

## 2017-06-22 DIAGNOSIS — J449 Chronic obstructive pulmonary disease, unspecified: Secondary | ICD-10-CM | POA: Diagnosis not present

## 2017-06-22 DIAGNOSIS — M810 Age-related osteoporosis without current pathological fracture: Secondary | ICD-10-CM | POA: Diagnosis not present

## 2017-06-23 DIAGNOSIS — I10 Essential (primary) hypertension: Secondary | ICD-10-CM | POA: Diagnosis not present

## 2017-06-23 DIAGNOSIS — I252 Old myocardial infarction: Secondary | ICD-10-CM | POA: Diagnosis not present

## 2017-06-23 DIAGNOSIS — M810 Age-related osteoporosis without current pathological fracture: Secondary | ICD-10-CM | POA: Diagnosis not present

## 2017-06-23 DIAGNOSIS — Z7982 Long term (current) use of aspirin: Secondary | ICD-10-CM | POA: Diagnosis not present

## 2017-06-23 DIAGNOSIS — Z9981 Dependence on supplemental oxygen: Secondary | ICD-10-CM | POA: Diagnosis not present

## 2017-06-23 DIAGNOSIS — E538 Deficiency of other specified B group vitamins: Secondary | ICD-10-CM | POA: Diagnosis not present

## 2017-06-23 DIAGNOSIS — E785 Hyperlipidemia, unspecified: Secondary | ICD-10-CM | POA: Diagnosis not present

## 2017-06-23 DIAGNOSIS — J439 Emphysema, unspecified: Secondary | ICD-10-CM | POA: Diagnosis not present

## 2017-06-23 DIAGNOSIS — K219 Gastro-esophageal reflux disease without esophagitis: Secondary | ICD-10-CM | POA: Diagnosis not present

## 2017-06-23 DIAGNOSIS — Z7902 Long term (current) use of antithrombotics/antiplatelets: Secondary | ICD-10-CM | POA: Diagnosis not present

## 2017-06-23 DIAGNOSIS — I2510 Atherosclerotic heart disease of native coronary artery without angina pectoris: Secondary | ICD-10-CM | POA: Diagnosis not present

## 2017-06-24 DIAGNOSIS — Z7902 Long term (current) use of antithrombotics/antiplatelets: Secondary | ICD-10-CM | POA: Diagnosis not present

## 2017-06-24 DIAGNOSIS — J439 Emphysema, unspecified: Secondary | ICD-10-CM | POA: Diagnosis not present

## 2017-06-24 DIAGNOSIS — I2510 Atherosclerotic heart disease of native coronary artery without angina pectoris: Secondary | ICD-10-CM | POA: Diagnosis not present

## 2017-06-24 DIAGNOSIS — E538 Deficiency of other specified B group vitamins: Secondary | ICD-10-CM | POA: Diagnosis not present

## 2017-06-24 DIAGNOSIS — K219 Gastro-esophageal reflux disease without esophagitis: Secondary | ICD-10-CM | POA: Diagnosis not present

## 2017-06-24 DIAGNOSIS — Z9981 Dependence on supplemental oxygen: Secondary | ICD-10-CM | POA: Diagnosis not present

## 2017-06-24 DIAGNOSIS — I10 Essential (primary) hypertension: Secondary | ICD-10-CM | POA: Diagnosis not present

## 2017-06-24 DIAGNOSIS — Z7982 Long term (current) use of aspirin: Secondary | ICD-10-CM | POA: Diagnosis not present

## 2017-06-24 DIAGNOSIS — E785 Hyperlipidemia, unspecified: Secondary | ICD-10-CM | POA: Diagnosis not present

## 2017-06-24 DIAGNOSIS — M810 Age-related osteoporosis without current pathological fracture: Secondary | ICD-10-CM | POA: Diagnosis not present

## 2017-06-24 DIAGNOSIS — I252 Old myocardial infarction: Secondary | ICD-10-CM | POA: Diagnosis not present

## 2017-06-28 DIAGNOSIS — K219 Gastro-esophageal reflux disease without esophagitis: Secondary | ICD-10-CM | POA: Diagnosis not present

## 2017-06-28 DIAGNOSIS — J439 Emphysema, unspecified: Secondary | ICD-10-CM | POA: Diagnosis not present

## 2017-06-28 DIAGNOSIS — I252 Old myocardial infarction: Secondary | ICD-10-CM | POA: Diagnosis not present

## 2017-06-28 DIAGNOSIS — Z9981 Dependence on supplemental oxygen: Secondary | ICD-10-CM | POA: Diagnosis not present

## 2017-06-28 DIAGNOSIS — Z7982 Long term (current) use of aspirin: Secondary | ICD-10-CM | POA: Diagnosis not present

## 2017-06-28 DIAGNOSIS — I10 Essential (primary) hypertension: Secondary | ICD-10-CM | POA: Diagnosis not present

## 2017-06-28 DIAGNOSIS — Z7902 Long term (current) use of antithrombotics/antiplatelets: Secondary | ICD-10-CM | POA: Diagnosis not present

## 2017-06-28 DIAGNOSIS — I2510 Atherosclerotic heart disease of native coronary artery without angina pectoris: Secondary | ICD-10-CM | POA: Diagnosis not present

## 2017-06-28 DIAGNOSIS — E538 Deficiency of other specified B group vitamins: Secondary | ICD-10-CM | POA: Diagnosis not present

## 2017-06-28 DIAGNOSIS — E785 Hyperlipidemia, unspecified: Secondary | ICD-10-CM | POA: Diagnosis not present

## 2017-06-28 DIAGNOSIS — M810 Age-related osteoporosis without current pathological fracture: Secondary | ICD-10-CM | POA: Diagnosis not present

## 2017-06-29 DIAGNOSIS — J439 Emphysema, unspecified: Secondary | ICD-10-CM | POA: Diagnosis not present

## 2017-06-29 DIAGNOSIS — I2510 Atherosclerotic heart disease of native coronary artery without angina pectoris: Secondary | ICD-10-CM | POA: Diagnosis not present

## 2017-06-29 DIAGNOSIS — E785 Hyperlipidemia, unspecified: Secondary | ICD-10-CM | POA: Diagnosis not present

## 2017-06-29 DIAGNOSIS — Z7982 Long term (current) use of aspirin: Secondary | ICD-10-CM | POA: Diagnosis not present

## 2017-06-29 DIAGNOSIS — Z7902 Long term (current) use of antithrombotics/antiplatelets: Secondary | ICD-10-CM | POA: Diagnosis not present

## 2017-06-29 DIAGNOSIS — Z9981 Dependence on supplemental oxygen: Secondary | ICD-10-CM | POA: Diagnosis not present

## 2017-06-29 DIAGNOSIS — I10 Essential (primary) hypertension: Secondary | ICD-10-CM | POA: Diagnosis not present

## 2017-06-29 DIAGNOSIS — E538 Deficiency of other specified B group vitamins: Secondary | ICD-10-CM | POA: Diagnosis not present

## 2017-06-29 DIAGNOSIS — K219 Gastro-esophageal reflux disease without esophagitis: Secondary | ICD-10-CM | POA: Diagnosis not present

## 2017-06-29 DIAGNOSIS — M810 Age-related osteoporosis without current pathological fracture: Secondary | ICD-10-CM | POA: Diagnosis not present

## 2017-06-29 DIAGNOSIS — I252 Old myocardial infarction: Secondary | ICD-10-CM | POA: Diagnosis not present

## 2017-07-01 DIAGNOSIS — E538 Deficiency of other specified B group vitamins: Secondary | ICD-10-CM | POA: Diagnosis not present

## 2017-07-01 DIAGNOSIS — I10 Essential (primary) hypertension: Secondary | ICD-10-CM | POA: Diagnosis not present

## 2017-07-01 DIAGNOSIS — Z7982 Long term (current) use of aspirin: Secondary | ICD-10-CM | POA: Diagnosis not present

## 2017-07-01 DIAGNOSIS — K219 Gastro-esophageal reflux disease without esophagitis: Secondary | ICD-10-CM | POA: Diagnosis not present

## 2017-07-01 DIAGNOSIS — E785 Hyperlipidemia, unspecified: Secondary | ICD-10-CM | POA: Diagnosis not present

## 2017-07-01 DIAGNOSIS — J439 Emphysema, unspecified: Secondary | ICD-10-CM | POA: Diagnosis not present

## 2017-07-01 DIAGNOSIS — M810 Age-related osteoporosis without current pathological fracture: Secondary | ICD-10-CM | POA: Diagnosis not present

## 2017-07-01 DIAGNOSIS — Z7902 Long term (current) use of antithrombotics/antiplatelets: Secondary | ICD-10-CM | POA: Diagnosis not present

## 2017-07-01 DIAGNOSIS — I2510 Atherosclerotic heart disease of native coronary artery without angina pectoris: Secondary | ICD-10-CM | POA: Diagnosis not present

## 2017-07-01 DIAGNOSIS — Z9981 Dependence on supplemental oxygen: Secondary | ICD-10-CM | POA: Diagnosis not present

## 2017-07-01 DIAGNOSIS — I252 Old myocardial infarction: Secondary | ICD-10-CM | POA: Diagnosis not present

## 2017-07-02 DIAGNOSIS — Z9981 Dependence on supplemental oxygen: Secondary | ICD-10-CM | POA: Diagnosis not present

## 2017-07-02 DIAGNOSIS — E538 Deficiency of other specified B group vitamins: Secondary | ICD-10-CM | POA: Diagnosis not present

## 2017-07-02 DIAGNOSIS — K219 Gastro-esophageal reflux disease without esophagitis: Secondary | ICD-10-CM | POA: Diagnosis not present

## 2017-07-02 DIAGNOSIS — J439 Emphysema, unspecified: Secondary | ICD-10-CM | POA: Diagnosis not present

## 2017-07-02 DIAGNOSIS — I252 Old myocardial infarction: Secondary | ICD-10-CM | POA: Diagnosis not present

## 2017-07-02 DIAGNOSIS — I2510 Atherosclerotic heart disease of native coronary artery without angina pectoris: Secondary | ICD-10-CM | POA: Diagnosis not present

## 2017-07-02 DIAGNOSIS — E785 Hyperlipidemia, unspecified: Secondary | ICD-10-CM | POA: Diagnosis not present

## 2017-07-02 DIAGNOSIS — M810 Age-related osteoporosis without current pathological fracture: Secondary | ICD-10-CM | POA: Diagnosis not present

## 2017-07-02 DIAGNOSIS — I10 Essential (primary) hypertension: Secondary | ICD-10-CM | POA: Diagnosis not present

## 2017-07-02 DIAGNOSIS — Z7902 Long term (current) use of antithrombotics/antiplatelets: Secondary | ICD-10-CM | POA: Diagnosis not present

## 2017-07-02 DIAGNOSIS — Z7982 Long term (current) use of aspirin: Secondary | ICD-10-CM | POA: Diagnosis not present

## 2017-07-05 DIAGNOSIS — I252 Old myocardial infarction: Secondary | ICD-10-CM | POA: Diagnosis not present

## 2017-07-05 DIAGNOSIS — M810 Age-related osteoporosis without current pathological fracture: Secondary | ICD-10-CM | POA: Diagnosis not present

## 2017-07-05 DIAGNOSIS — E785 Hyperlipidemia, unspecified: Secondary | ICD-10-CM | POA: Diagnosis not present

## 2017-07-05 DIAGNOSIS — Z9981 Dependence on supplemental oxygen: Secondary | ICD-10-CM | POA: Diagnosis not present

## 2017-07-05 DIAGNOSIS — J439 Emphysema, unspecified: Secondary | ICD-10-CM | POA: Diagnosis not present

## 2017-07-05 DIAGNOSIS — I2510 Atherosclerotic heart disease of native coronary artery without angina pectoris: Secondary | ICD-10-CM | POA: Diagnosis not present

## 2017-07-05 DIAGNOSIS — Z7982 Long term (current) use of aspirin: Secondary | ICD-10-CM | POA: Diagnosis not present

## 2017-07-05 DIAGNOSIS — Z7902 Long term (current) use of antithrombotics/antiplatelets: Secondary | ICD-10-CM | POA: Diagnosis not present

## 2017-07-05 DIAGNOSIS — E538 Deficiency of other specified B group vitamins: Secondary | ICD-10-CM | POA: Diagnosis not present

## 2017-07-05 DIAGNOSIS — K219 Gastro-esophageal reflux disease without esophagitis: Secondary | ICD-10-CM | POA: Diagnosis not present

## 2017-07-05 DIAGNOSIS — I10 Essential (primary) hypertension: Secondary | ICD-10-CM | POA: Diagnosis not present

## 2017-07-06 DIAGNOSIS — I10 Essential (primary) hypertension: Secondary | ICD-10-CM | POA: Diagnosis not present

## 2017-07-06 DIAGNOSIS — E785 Hyperlipidemia, unspecified: Secondary | ICD-10-CM | POA: Diagnosis not present

## 2017-07-06 DIAGNOSIS — Z7902 Long term (current) use of antithrombotics/antiplatelets: Secondary | ICD-10-CM | POA: Diagnosis not present

## 2017-07-06 DIAGNOSIS — J439 Emphysema, unspecified: Secondary | ICD-10-CM | POA: Diagnosis not present

## 2017-07-06 DIAGNOSIS — E538 Deficiency of other specified B group vitamins: Secondary | ICD-10-CM | POA: Diagnosis not present

## 2017-07-06 DIAGNOSIS — I2510 Atherosclerotic heart disease of native coronary artery without angina pectoris: Secondary | ICD-10-CM | POA: Diagnosis not present

## 2017-07-06 DIAGNOSIS — K219 Gastro-esophageal reflux disease without esophagitis: Secondary | ICD-10-CM | POA: Diagnosis not present

## 2017-07-06 DIAGNOSIS — I252 Old myocardial infarction: Secondary | ICD-10-CM | POA: Diagnosis not present

## 2017-07-06 DIAGNOSIS — Z7982 Long term (current) use of aspirin: Secondary | ICD-10-CM | POA: Diagnosis not present

## 2017-07-06 DIAGNOSIS — M810 Age-related osteoporosis without current pathological fracture: Secondary | ICD-10-CM | POA: Diagnosis not present

## 2017-07-06 DIAGNOSIS — Z9981 Dependence on supplemental oxygen: Secondary | ICD-10-CM | POA: Diagnosis not present

## 2017-07-07 ENCOUNTER — Other Ambulatory Visit: Payer: Self-pay | Admitting: Family Medicine

## 2017-07-07 DIAGNOSIS — Z9981 Dependence on supplemental oxygen: Secondary | ICD-10-CM | POA: Diagnosis not present

## 2017-07-07 DIAGNOSIS — E538 Deficiency of other specified B group vitamins: Secondary | ICD-10-CM | POA: Diagnosis not present

## 2017-07-07 DIAGNOSIS — I10 Essential (primary) hypertension: Secondary | ICD-10-CM | POA: Diagnosis not present

## 2017-07-07 DIAGNOSIS — M810 Age-related osteoporosis without current pathological fracture: Secondary | ICD-10-CM | POA: Diagnosis not present

## 2017-07-07 DIAGNOSIS — Z7982 Long term (current) use of aspirin: Secondary | ICD-10-CM | POA: Diagnosis not present

## 2017-07-07 DIAGNOSIS — K219 Gastro-esophageal reflux disease without esophagitis: Secondary | ICD-10-CM | POA: Diagnosis not present

## 2017-07-07 DIAGNOSIS — I252 Old myocardial infarction: Secondary | ICD-10-CM | POA: Diagnosis not present

## 2017-07-07 DIAGNOSIS — E785 Hyperlipidemia, unspecified: Secondary | ICD-10-CM | POA: Diagnosis not present

## 2017-07-07 DIAGNOSIS — J439 Emphysema, unspecified: Secondary | ICD-10-CM | POA: Diagnosis not present

## 2017-07-07 DIAGNOSIS — Z7902 Long term (current) use of antithrombotics/antiplatelets: Secondary | ICD-10-CM | POA: Diagnosis not present

## 2017-07-07 DIAGNOSIS — I2510 Atherosclerotic heart disease of native coronary artery without angina pectoris: Secondary | ICD-10-CM | POA: Diagnosis not present

## 2017-07-07 NOTE — Telephone Encounter (Signed)
Electronic refill request Last office visit 06/18/17 Last refill 05/29/16

## 2017-07-08 DIAGNOSIS — E785 Hyperlipidemia, unspecified: Secondary | ICD-10-CM | POA: Diagnosis not present

## 2017-07-08 DIAGNOSIS — K219 Gastro-esophageal reflux disease without esophagitis: Secondary | ICD-10-CM | POA: Diagnosis not present

## 2017-07-08 DIAGNOSIS — I2510 Atherosclerotic heart disease of native coronary artery without angina pectoris: Secondary | ICD-10-CM | POA: Diagnosis not present

## 2017-07-08 DIAGNOSIS — I252 Old myocardial infarction: Secondary | ICD-10-CM | POA: Diagnosis not present

## 2017-07-08 DIAGNOSIS — I10 Essential (primary) hypertension: Secondary | ICD-10-CM | POA: Diagnosis not present

## 2017-07-08 DIAGNOSIS — Z9981 Dependence on supplemental oxygen: Secondary | ICD-10-CM | POA: Diagnosis not present

## 2017-07-08 DIAGNOSIS — E538 Deficiency of other specified B group vitamins: Secondary | ICD-10-CM | POA: Diagnosis not present

## 2017-07-08 DIAGNOSIS — J439 Emphysema, unspecified: Secondary | ICD-10-CM | POA: Diagnosis not present

## 2017-07-08 DIAGNOSIS — Z7982 Long term (current) use of aspirin: Secondary | ICD-10-CM | POA: Diagnosis not present

## 2017-07-08 DIAGNOSIS — Z7902 Long term (current) use of antithrombotics/antiplatelets: Secondary | ICD-10-CM | POA: Diagnosis not present

## 2017-07-08 DIAGNOSIS — M810 Age-related osteoporosis without current pathological fracture: Secondary | ICD-10-CM | POA: Diagnosis not present

## 2017-07-08 NOTE — Telephone Encounter (Signed)
Sent. Thanks.   

## 2017-07-11 DIAGNOSIS — Z7982 Long term (current) use of aspirin: Secondary | ICD-10-CM | POA: Diagnosis not present

## 2017-07-11 DIAGNOSIS — K219 Gastro-esophageal reflux disease without esophagitis: Secondary | ICD-10-CM | POA: Diagnosis not present

## 2017-07-11 DIAGNOSIS — I252 Old myocardial infarction: Secondary | ICD-10-CM | POA: Diagnosis not present

## 2017-07-11 DIAGNOSIS — Z7902 Long term (current) use of antithrombotics/antiplatelets: Secondary | ICD-10-CM | POA: Diagnosis not present

## 2017-07-11 DIAGNOSIS — M810 Age-related osteoporosis without current pathological fracture: Secondary | ICD-10-CM | POA: Diagnosis not present

## 2017-07-11 DIAGNOSIS — Z9981 Dependence on supplemental oxygen: Secondary | ICD-10-CM | POA: Diagnosis not present

## 2017-07-11 DIAGNOSIS — J439 Emphysema, unspecified: Secondary | ICD-10-CM | POA: Diagnosis not present

## 2017-07-11 DIAGNOSIS — I2510 Atherosclerotic heart disease of native coronary artery without angina pectoris: Secondary | ICD-10-CM | POA: Diagnosis not present

## 2017-07-11 DIAGNOSIS — I10 Essential (primary) hypertension: Secondary | ICD-10-CM | POA: Diagnosis not present

## 2017-07-11 DIAGNOSIS — E785 Hyperlipidemia, unspecified: Secondary | ICD-10-CM | POA: Diagnosis not present

## 2017-07-11 DIAGNOSIS — E538 Deficiency of other specified B group vitamins: Secondary | ICD-10-CM | POA: Diagnosis not present

## 2017-07-12 ENCOUNTER — Telehealth: Payer: Self-pay | Admitting: Family Medicine

## 2017-07-12 NOTE — Telephone Encounter (Signed)
Copied from [place] [phone]. Topic: Quick Communication - See Telephone Encounter >> Jul 12, 2017  4:01 PM Vernona Rieger wrote: CRM for notification. See Telephone encounter for: 07/12/17.  [place] from [facility] would like verbal orders for home health Occupational therapy for one time a week for one week & two times a week for one week Call back is [phone]

## 2017-07-13 DIAGNOSIS — M810 Age-related osteoporosis without current pathological fracture: Secondary | ICD-10-CM | POA: Diagnosis not present

## 2017-07-13 DIAGNOSIS — Z9981 Dependence on supplemental oxygen: Secondary | ICD-10-CM | POA: Diagnosis not present

## 2017-07-13 DIAGNOSIS — E538 Deficiency of other specified B group vitamins: Secondary | ICD-10-CM | POA: Diagnosis not present

## 2017-07-13 DIAGNOSIS — I252 Old myocardial infarction: Secondary | ICD-10-CM | POA: Diagnosis not present

## 2017-07-13 DIAGNOSIS — Z7902 Long term (current) use of antithrombotics/antiplatelets: Secondary | ICD-10-CM | POA: Diagnosis not present

## 2017-07-13 DIAGNOSIS — J439 Emphysema, unspecified: Secondary | ICD-10-CM | POA: Diagnosis not present

## 2017-07-13 DIAGNOSIS — K219 Gastro-esophageal reflux disease without esophagitis: Secondary | ICD-10-CM | POA: Diagnosis not present

## 2017-07-13 DIAGNOSIS — I2510 Atherosclerotic heart disease of native coronary artery without angina pectoris: Secondary | ICD-10-CM | POA: Diagnosis not present

## 2017-07-13 DIAGNOSIS — Z7982 Long term (current) use of aspirin: Secondary | ICD-10-CM | POA: Diagnosis not present

## 2017-07-13 DIAGNOSIS — E785 Hyperlipidemia, unspecified: Secondary | ICD-10-CM | POA: Diagnosis not present

## 2017-07-13 DIAGNOSIS — I10 Essential (primary) hypertension: Secondary | ICD-10-CM | POA: Diagnosis not present

## 2017-07-13 NOTE — Telephone Encounter (Signed)
Please give the order.  Thanks.   

## 2017-07-13 NOTE — Telephone Encounter (Signed)
Left detailed message on voicemail of [place] with [facility].

## 2017-07-14 DIAGNOSIS — I509 Heart failure, unspecified: Secondary | ICD-10-CM | POA: Diagnosis not present

## 2017-07-14 DIAGNOSIS — J449 Chronic obstructive pulmonary disease, unspecified: Secondary | ICD-10-CM | POA: Diagnosis not present

## 2017-07-14 DIAGNOSIS — G4733 Obstructive sleep apnea (adult) (pediatric): Secondary | ICD-10-CM | POA: Diagnosis not present

## 2017-07-15 DIAGNOSIS — I10 Essential (primary) hypertension: Secondary | ICD-10-CM | POA: Diagnosis not present

## 2017-07-15 DIAGNOSIS — E538 Deficiency of other specified B group vitamins: Secondary | ICD-10-CM | POA: Diagnosis not present

## 2017-07-15 DIAGNOSIS — Z7982 Long term (current) use of aspirin: Secondary | ICD-10-CM | POA: Diagnosis not present

## 2017-07-15 DIAGNOSIS — M810 Age-related osteoporosis without current pathological fracture: Secondary | ICD-10-CM | POA: Diagnosis not present

## 2017-07-15 DIAGNOSIS — I252 Old myocardial infarction: Secondary | ICD-10-CM | POA: Diagnosis not present

## 2017-07-15 DIAGNOSIS — K219 Gastro-esophageal reflux disease without esophagitis: Secondary | ICD-10-CM | POA: Diagnosis not present

## 2017-07-15 DIAGNOSIS — E785 Hyperlipidemia, unspecified: Secondary | ICD-10-CM | POA: Diagnosis not present

## 2017-07-15 DIAGNOSIS — Z9981 Dependence on supplemental oxygen: Secondary | ICD-10-CM | POA: Diagnosis not present

## 2017-07-15 DIAGNOSIS — I2510 Atherosclerotic heart disease of native coronary artery without angina pectoris: Secondary | ICD-10-CM | POA: Diagnosis not present

## 2017-07-15 DIAGNOSIS — J439 Emphysema, unspecified: Secondary | ICD-10-CM | POA: Diagnosis not present

## 2017-07-15 DIAGNOSIS — Z7902 Long term (current) use of antithrombotics/antiplatelets: Secondary | ICD-10-CM | POA: Diagnosis not present

## 2017-07-16 DIAGNOSIS — I2510 Atherosclerotic heart disease of native coronary artery without angina pectoris: Secondary | ICD-10-CM | POA: Diagnosis not present

## 2017-07-16 DIAGNOSIS — M810 Age-related osteoporosis without current pathological fracture: Secondary | ICD-10-CM | POA: Diagnosis not present

## 2017-07-16 DIAGNOSIS — Z7902 Long term (current) use of antithrombotics/antiplatelets: Secondary | ICD-10-CM | POA: Diagnosis not present

## 2017-07-16 DIAGNOSIS — I252 Old myocardial infarction: Secondary | ICD-10-CM | POA: Diagnosis not present

## 2017-07-16 DIAGNOSIS — Z9981 Dependence on supplemental oxygen: Secondary | ICD-10-CM | POA: Diagnosis not present

## 2017-07-16 DIAGNOSIS — E785 Hyperlipidemia, unspecified: Secondary | ICD-10-CM | POA: Diagnosis not present

## 2017-07-16 DIAGNOSIS — K219 Gastro-esophageal reflux disease without esophagitis: Secondary | ICD-10-CM | POA: Diagnosis not present

## 2017-07-16 DIAGNOSIS — I10 Essential (primary) hypertension: Secondary | ICD-10-CM | POA: Diagnosis not present

## 2017-07-16 DIAGNOSIS — J439 Emphysema, unspecified: Secondary | ICD-10-CM | POA: Diagnosis not present

## 2017-07-16 DIAGNOSIS — Z7982 Long term (current) use of aspirin: Secondary | ICD-10-CM | POA: Diagnosis not present

## 2017-07-16 DIAGNOSIS — E538 Deficiency of other specified B group vitamins: Secondary | ICD-10-CM | POA: Diagnosis not present

## 2017-07-18 ENCOUNTER — Emergency Department
Admission: EM | Admit: 2017-07-18 | Discharge: 2017-07-18 | Disposition: A | Discharge status: Home / Self Care | Payer: Medicare Other | Source: Home / Self Care | Attending: Emergency Medicine | Admitting: Emergency Medicine

## 2017-07-18 ENCOUNTER — Emergency Department: Payer: Medicare Other

## 2017-07-18 ENCOUNTER — Other Ambulatory Visit: Payer: Self-pay

## 2017-07-18 DIAGNOSIS — I5032 Chronic diastolic (congestive) heart failure: Secondary | ICD-10-CM | POA: Diagnosis not present

## 2017-07-18 DIAGNOSIS — Z9981 Dependence on supplemental oxygen: Secondary | ICD-10-CM | POA: Diagnosis not present

## 2017-07-18 DIAGNOSIS — J441 Chronic obstructive pulmonary disease with (acute) exacerbation: Secondary | ICD-10-CM | POA: Insufficient documentation

## 2017-07-18 DIAGNOSIS — M6281 Muscle weakness (generalized): Secondary | ICD-10-CM | POA: Diagnosis not present

## 2017-07-18 DIAGNOSIS — R0600 Dyspnea, unspecified: Secondary | ICD-10-CM | POA: Diagnosis not present

## 2017-07-18 DIAGNOSIS — Z9012 Acquired absence of left breast and nipple: Secondary | ICD-10-CM | POA: Diagnosis not present

## 2017-07-18 DIAGNOSIS — J9622 Acute and chronic respiratory failure with hypercapnia: Secondary | ICD-10-CM | POA: Diagnosis not present

## 2017-07-18 DIAGNOSIS — M810 Age-related osteoporosis without current pathological fracture: Secondary | ICD-10-CM | POA: Diagnosis not present

## 2017-07-18 DIAGNOSIS — K219 Gastro-esophageal reflux disease without esophagitis: Secondary | ICD-10-CM | POA: Diagnosis not present

## 2017-07-18 DIAGNOSIS — Z7982 Long term (current) use of aspirin: Secondary | ICD-10-CM | POA: Insufficient documentation

## 2017-07-18 DIAGNOSIS — Z7951 Long term (current) use of inhaled steroids: Secondary | ICD-10-CM | POA: Diagnosis not present

## 2017-07-18 DIAGNOSIS — R262 Difficulty in walking, not elsewhere classified: Secondary | ICD-10-CM | POA: Diagnosis not present

## 2017-07-18 DIAGNOSIS — I25118 Atherosclerotic heart disease of native coronary artery with other forms of angina pectoris: Secondary | ICD-10-CM | POA: Diagnosis not present

## 2017-07-18 DIAGNOSIS — R001 Bradycardia, unspecified: Secondary | ICD-10-CM | POA: Diagnosis not present

## 2017-07-18 DIAGNOSIS — I119 Hypertensive heart disease without heart failure: Secondary | ICD-10-CM | POA: Insufficient documentation

## 2017-07-18 DIAGNOSIS — I2510 Atherosclerotic heart disease of native coronary artery without angina pectoris: Secondary | ICD-10-CM | POA: Diagnosis not present

## 2017-07-18 DIAGNOSIS — M1990 Unspecified osteoarthritis, unspecified site: Secondary | ICD-10-CM | POA: Diagnosis not present

## 2017-07-18 DIAGNOSIS — Z87891 Personal history of nicotine dependence: Secondary | ICD-10-CM | POA: Insufficient documentation

## 2017-07-18 DIAGNOSIS — E876 Hypokalemia: Secondary | ICD-10-CM | POA: Insufficient documentation

## 2017-07-18 DIAGNOSIS — Z66 Do not resuscitate: Secondary | ICD-10-CM | POA: Diagnosis not present

## 2017-07-18 DIAGNOSIS — E785 Hyperlipidemia, unspecified: Secondary | ICD-10-CM | POA: Diagnosis not present

## 2017-07-18 DIAGNOSIS — Z7902 Long term (current) use of antithrombotics/antiplatelets: Secondary | ICD-10-CM | POA: Diagnosis not present

## 2017-07-18 DIAGNOSIS — J9621 Acute and chronic respiratory failure with hypoxia: Secondary | ICD-10-CM | POA: Diagnosis not present

## 2017-07-18 DIAGNOSIS — Z955 Presence of coronary angioplasty implant and graft: Secondary | ICD-10-CM | POA: Diagnosis not present

## 2017-07-18 DIAGNOSIS — R0602 Shortness of breath: Secondary | ICD-10-CM | POA: Diagnosis not present

## 2017-07-18 DIAGNOSIS — I110 Hypertensive heart disease with heart failure: Secondary | ICD-10-CM | POA: Diagnosis not present

## 2017-07-18 DIAGNOSIS — J9601 Acute respiratory failure with hypoxia: Secondary | ICD-10-CM | POA: Diagnosis not present

## 2017-07-18 DIAGNOSIS — E440 Moderate protein-calorie malnutrition: Secondary | ICD-10-CM | POA: Diagnosis not present

## 2017-07-18 DIAGNOSIS — E538 Deficiency of other specified B group vitamins: Secondary | ICD-10-CM | POA: Diagnosis not present

## 2017-07-18 DIAGNOSIS — Z79899 Other long term (current) drug therapy: Secondary | ICD-10-CM

## 2017-07-18 DIAGNOSIS — R05 Cough: Secondary | ICD-10-CM | POA: Diagnosis not present

## 2017-07-18 DIAGNOSIS — K5900 Constipation, unspecified: Secondary | ICD-10-CM | POA: Diagnosis not present

## 2017-07-18 DIAGNOSIS — J9611 Chronic respiratory failure with hypoxia: Secondary | ICD-10-CM | POA: Diagnosis not present

## 2017-07-18 DIAGNOSIS — J449 Chronic obstructive pulmonary disease, unspecified: Secondary | ICD-10-CM | POA: Diagnosis not present

## 2017-07-18 DIAGNOSIS — G4733 Obstructive sleep apnea (adult) (pediatric): Secondary | ICD-10-CM | POA: Diagnosis not present

## 2017-07-18 DIAGNOSIS — I509 Heart failure, unspecified: Secondary | ICD-10-CM | POA: Diagnosis not present

## 2017-07-18 DIAGNOSIS — J439 Emphysema, unspecified: Secondary | ICD-10-CM | POA: Diagnosis not present

## 2017-07-18 DIAGNOSIS — G9340 Encephalopathy, unspecified: Secondary | ICD-10-CM | POA: Diagnosis not present

## 2017-07-18 DIAGNOSIS — G9349 Other encephalopathy: Secondary | ICD-10-CM | POA: Diagnosis not present

## 2017-07-18 DIAGNOSIS — I252 Old myocardial infarction: Secondary | ICD-10-CM | POA: Diagnosis not present

## 2017-07-18 DIAGNOSIS — I10 Essential (primary) hypertension: Secondary | ICD-10-CM | POA: Diagnosis not present

## 2017-07-18 DIAGNOSIS — J209 Acute bronchitis, unspecified: Secondary | ICD-10-CM | POA: Diagnosis not present

## 2017-07-18 DIAGNOSIS — J440 Chronic obstructive pulmonary disease with acute lower respiratory infection: Secondary | ICD-10-CM | POA: Diagnosis not present

## 2017-07-18 DIAGNOSIS — Z7401 Bed confinement status: Secondary | ICD-10-CM | POA: Diagnosis not present

## 2017-07-18 DIAGNOSIS — Z87442 Personal history of urinary calculi: Secondary | ICD-10-CM | POA: Diagnosis not present

## 2017-07-18 DIAGNOSIS — Z85828 Personal history of other malignant neoplasm of skin: Secondary | ICD-10-CM | POA: Diagnosis not present

## 2017-07-18 LAB — CBC
HCT: 36 % (ref 35.0–47.0)
Hemoglobin: 12.4 g/dL (ref 12.0–16.0)
MCH: 29.8 pg (ref 26.0–34.0)
MCHC: 34.4 g/dL (ref 32.0–36.0)
MCV: 86.9 fL (ref 80.0–100.0)
PLATELETS: 242 10*3/uL (ref 150–440)
RBC: 4.15 MIL/uL (ref 3.80–5.20)
RDW: 14.7 % — AB (ref 11.5–14.5)
WBC: 7.7 10*3/uL (ref 3.6–11.0)

## 2017-07-18 LAB — BASIC METABOLIC PANEL
Anion gap: 7 (ref 5–15)
BUN: 13 mg/dL (ref 6–20)
CALCIUM: 9.4 mg/dL (ref 8.9–10.3)
CO2: 34 mmol/L — ABNORMAL HIGH (ref 22–32)
CREATININE: 0.81 mg/dL (ref 0.44–1.00)
Chloride: 94 mmol/L — ABNORMAL LOW (ref 101–111)
Glucose, Bld: 99 mg/dL (ref 65–99)
Potassium: 2.9 mmol/L — ABNORMAL LOW (ref 3.5–5.1)
SODIUM: 135 mmol/L (ref 135–145)

## 2017-07-18 LAB — TROPONIN I

## 2017-07-18 MED ORDER — PREDNISONE 20 MG PO TABS: 60.0000 mg | ORAL_TABLET | Freq: Every day | ORAL | 0 refills | Status: DC

## 2017-07-18 MED ORDER — GUAIFENESIN 100 MG/5ML PO SOLN
400.0000 mg | Freq: Once | ORAL | Status: AC
  Administered 2017-07-18: 400 mg via ORAL
  Filled 2017-07-18: qty 20

## 2017-07-18 MED ORDER — ONDANSETRON 4 MG PO TBDP
8.0000 mg | ORAL_TABLET | ORAL | Status: AC
  Administered 2017-07-18: 8 mg via ORAL
  Filled 2017-07-18: qty 2

## 2017-07-18 MED ORDER — POTASSIUM CHLORIDE 10 MEQ/100ML IV SOLN
10.0000 meq | Freq: Once | INTRAVENOUS | Status: AC
  Administered 2017-07-18: 10 meq via INTRAVENOUS
  Filled 2017-07-18: qty 100

## 2017-07-18 MED ORDER — ALBUTEROL SULFATE (2.5 MG/3ML) 0.083% IN NEBU
2.5000 mg | INHALATION_SOLUTION | Freq: Once | RESPIRATORY_TRACT | Status: AC
  Administered 2017-07-18: 2.5 mg via RESPIRATORY_TRACT
  Filled 2017-07-18: qty 3

## 2017-07-18 MED ORDER — POTASSIUM CHLORIDE ER 10 MEQ PO TBCR: 10.0000 meq | EXTENDED_RELEASE_TABLET | Freq: Two times a day (BID) | ORAL | 0 refills | Status: DC

## 2017-07-18 MED ORDER — GUAIFENESIN 200 MG PO TABS
400.0000 mg | ORAL_TABLET | Freq: Once | ORAL | Status: DC
  Filled 2017-07-18: qty 2

## 2017-07-18 MED ORDER — AZITHROMYCIN 250 MG PO TABS: ORAL_TABLET | ORAL | 0 refills | Status: DC

## 2017-07-18 MED ORDER — POTASSIUM CHLORIDE 20 MEQ PO PACK
40.0000 meq | PACK | Freq: Once | ORAL | Status: AC
  Administered 2017-07-18: 40 meq via ORAL
  Filled 2017-07-18: qty 2

## 2017-07-18 NOTE — ED Notes (Signed)
Pt given cup of water 

## 2017-07-18 NOTE — ED Provider Notes (Signed)
[facility] Emergency Department Provider Note ____________________________________________   First MD Initiated Contact with Patient 07/18/17 1411     (approximate)  I have reviewed the triage vital signs and the nursing notes.   HISTORY  Chief Complaint Shortness of Breath    HPI Mckenzie Mclaughlin is a 77 y.o. female with history of COPD and other PMH as noted below who presents with shortness of breath, acute onset this morning, persistent course, not relieved by albuterol inhaler at home, and associated with cough productive of whitish sputum.  Patient denies fever, vomiting, chest pain, or abdominal pain.   Past Medical History:  Diagnosis Date  . Anginal pain [facility])    see dr [name]  "Spams"  . Anxiety   . Arthritis   . Cancer (HCC)    side of head- skin cancer, squamous cell ca on left foot.  . Chronic airway obstruction (HCC)    chronic airway obstruction not elsewhere classified, unspecified  . Chronic obstructive asthma ([place])    unspecified  . Complication of anesthesia   . Constipation   . COPD (chronic obstructive pulmonary disease) ([place])   . COPD with emphysema ([place]) 01/05/2013  . COPD, severe 07/29/98  . Coronary artery disease 01/22/99   Non Q-wave MI, stent RCA  . Coronary atherosclerosis of native coronary artery 01/22/1999   status post stent RCA,   . Depression   . Dysrhythmia    Palpations at times. last time 10/29 ish 2014  . Esophageal dilatation    Pt needs appple sauce to take meds.  . Esophageal stricture    PT needs apple sauce to take meds  . GERD (gastroesophageal reflux disease) 08/29/98   severe-no meds now  . Head injury, acute, with loss of consciousness ([place]) 11/2012   unsure how long  . History of blood transfusion   . History of blurry vision 05/06-05/10/2009   Hospital [facility] CP R/O'D Blurry vision, smoking  . History of CT scan of head 08/02/09   w/o mild age appropr atrophy  . History of ETT  08/03/09   Myoview Normal  . History of ETT 05/1999   Cardiolite pos ETT, neg Cardiolite  . History of ETT 09/02/01   Normal  . History of ETT 04/28/2006   Myoview normal EF 83%  . History of ETT 04/10/11   normal EF and no ischemia per Dr. Saralyn Pilar  . History of kidney stones 03/2017  . History of pneumothorax 01/05/2013  . Hx of cardiac catheterization 08/29/01   60% RCA 0/w 20-30% lesions  . Hx of cardiac catheterization 01/22/99   w/Stent 90% RCA lesion  . Hyperlipidemia, unspecified   . Hypertension    03/30/00  . Mental disorder   . On home oxygen therapy    as needed-has a travel tank  . Pneumothorax 2/14  . PONV (postoperative nausea and vomiting)    ad breast remocved and see was under anesthesia a long time  . Shortness of breath   . Status post aortic coarctation stent placement   . Tobacco abuse    1/4 pack daily    Patient Active Problem List   Diagnosis Date Noted  . Renal stone 06/20/2017  . Fall at home 04/05/2017  . Unstable angina ([place]) 09/25/2016  . Dizziness 05/14/2016  . Atrophic vaginitis 02/28/2015  . Gross hematuria 02/26/2015  . Radicular pain in right arm 11/21/2014  . Pupil asymmetry 11/16/2014  . Osteoporosis 11/16/2014  . Senile purpura ([place]) 11/16/2014  .  Right hand pain 11/16/2014  . Advance care planning 08/15/2014  . Other specified counseling 08/15/2014  . Constipation 06/21/2014  . NSTEMI (non-ST elevated myocardial infarction) ([place]) 12/17/2013  . Acute subendocardial infarction ([place]) 12/17/2013  . History of cardiac catheterization 09/15/2013  . Chest pain on exertion 08/25/2013  . Medicare annual wellness visit, subsequent 07/09/2013  . Beat, premature ventricular 06/26/2013  . Awareness of heartbeats 06/26/2013  . Arteriosclerosis of coronary artery 01/05/2013  . Pulmonary emphysema ([place]) 01/05/2013  . Acid reflux 01/05/2013  . H/O pneumothorax 01/05/2013  . Neck pain 08/31/2012  . Anxiety and depression 07/11/2012  .  Dysthymia 07/11/2012  . Esophageal stricture   . Nutritional marasmus ([place]) 06/22/2012  . Lung nodule 04/17/2012  . Edema of foot 04/01/2012  . Paresthesia 10/27/2011  . Atypical chest pain 05/13/2011  . HLD (hyperlipidemia) 02/26/2011  . History of mammogram 02/26/2011  . Vertigo 02/18/2011  . B12 deficiency 01/31/2009  . HEADACHE 10/22/2006  . PREMATURE VENTRICULAR CONTRACTIONS, FREQUENT 07/19/2006  . HYPERTENSION 03/30/2000  . Essential (primary) hypertension 03/30/2000  . CORONARY ARTERY DISEASE 01/22/1999  . Atherosclerosis of coronary artery 01/22/1999  . GERD 08/29/1998  . Chronic obstructive pulmonary disease ([place]) 07/29/1998    Past Surgical History:  Procedure Laterality Date  . BACK SURGERY    . BREAST SURGERY     implant removal, R breast  . CARDIAC CATHETERIZATION    . CAROTID STENT Right 2000  . CATARACT EXTRACTION Bilateral    2016  . COLONOSCOPY    . CYSTOSCOPY W/ RETROGRADES Bilateral 04/28/2017   Procedure: CYSTOSCOPY WITH RETROGRADE PYELOGRAM;  Surgeon: Hollice Espy, MD;  Location: [facility] ORS;  Service: Urology;  Laterality: Bilateral;  . CYSTOSCOPY/URETEROSCOPY/HOLMIUM LASER/STENT PLACEMENT Left 04/28/2017   Procedure: CYSTOSCOPY/URETEROSCOPY/HOLMIUM LASER/STENT PLACEMENT;  Surgeon: Hollice Espy, MD;  Location: [facility] ORS;  Service: Urology;  Laterality: Left;  . ESOPHAGEAL DILATION  06/00   EGD  . LAMINECTOMY  1976   Disc Removal X 2 Lumbar  . MASTECTOMY  03/1976   Bilateral due FCBD with implants [facility])  . MASTECTOMY Left    due to fibrocystic breast disease  . OOPHORECTOMY    . TISSUE EXPANDER PLACEMENT Right 02/03/2013   Procedure: REMOVAL OF RIGHT BREAST IMPLANT AND IMPLANT MATERIAL  CAPSULECTOMY;  Surgeon: Irene Limbo, MD;  Location: [place];  Service: Plastics;  Laterality: Right;  . TOTAL ABDOMINAL HYSTERECTOMY W/ BILATERAL SALPINGOOPHORECTOMY      Prior to Admission medications   Medication Sig Start Date End Date Taking? Authorizing  Provider  ALPRAZolam Duanne Moron) 0.5 MG tablet TAKE 1 TABLET BY MOUTH 3 TIMES A DAY AS NEEDED 05/27/17   Tonia Ghent, MD  aspirin EC 81 MG tablet Take 81 mg by mouth daily.     [provider]  atorvastatin (LIPITOR) 20 MG tablet Take 20 mg by mouth daily.    [provider]  budesonide-formoterol (SYMBICORT) 160-4.5 MCG/ACT inhaler Inhale 2 puffs into the lungs 2 (two) times daily. 06/18/14   Tonia Ghent, MD  clopidogrel (PLAVIX) 75 MG tablet TAKE 1 TABLET BY MOUTH ONCE A DAY 04/02/17   Tonia Ghent, MD  Cyanocobalamin (B-12 COMPLIANCE INJECTION IJ) Inject 1 Dose as directed every 30 (thirty) days.    [provider]  diltiazem (CARDIZEM) 60 MG tablet Take 1 tablet (60 mg total) by mouth 3 (three) times daily. 06/18/17   Tonia Ghent, MD  docusate sodium (COLACE) 100 MG capsule Take 1 capsule (100 mg total) by mouth 2 (  two) times daily. 04/28/17   Hollice Espy, MD  furosemide (LASIX) 40 MG tablet Take 1 tablet (40 mg total) by mouth daily. 10/05/16   Epifanio Lesches, MD  ipratropium-albuterol (DUONEB) 0.5-2.5 (3) MG/3ML SOLN Take 3 mLs by nebulization every 4 (four) hours as needed (shortness of breath). 06/18/17   Tonia Ghent, MD  magnesium hydroxide (MILK OF MAGNESIA) 400 MG/5ML suspension Take 30 mLs by mouth daily. 10/05/16   Epifanio Lesches, MD  metoprolol succinate (TOPROL-XL) 50 MG 24 hr tablet Take 1 tablet (50 mg total) by mouth daily. 06/18/17   Tonia Ghent, MD  neomycin-bacitracin-polymyxin (NEOSPORIN) ointment Apply 1 application topically as needed for wound care.    [provider]  nitroGLYCERIN (NITROSTAT) 0.4 MG SL tablet Place 1 tablet (0.4 mg total) under the tongue every 5 (five) minutes as needed for chest pain (max 3 doses in 15 min). 12/29/13   Tonia Ghent, MD  OXYGEN Inhale 2-3 L into the lungs continuous.    [provider]  predniSONE (DELTASONE) 20 MG tablet Take 3 tablets (60 mg total) by mouth  daily for 4 days. 07/19/17 07/23/17  Arta Silence, MD  PROAIR HFA 108 [phone] Base) MCG/ACT inhaler INHALE 2 PUFFS BY MOUTH EVERY 6 HOURS ASNEEDED 07/08/17   Tonia Ghent, MD  ranolazine (RANEXA) 500 MG 12 hr tablet Take 1 tablet (500 mg total) by mouth 2 (two) times daily. 10/05/16   Epifanio Lesches, MD  tiotropium (SPIRIVA HANDIHALER) 18 MCG inhalation capsule Inhale 4mcg once daily 07/04/14   [provider]  venlafaxine (EFFEXOR) 37.5 MG tablet Take 37.5 mg by mouth daily.    [provider]    Allergies Amoxicillin; Doxycycline; Ibuprofen; and Sertraline hcl  Family History  Problem Relation Age of Onset  . Stroke Mother   . Heart disease Mother        MI  . Heart failure Mother   . Cancer Father        Lung  . Lung cancer Father   . COPD Brother   . Lung cancer Brother   . Heart disease Brother        CAD  . Heart disease Sister        cirrhosis due heart disease  . Cirrhosis Sister   . Coronary artery disease Sister   . Colon cancer Neg Hx   . Breast cancer Neg Hx     Social History Social History   Tobacco Use  . Smoking status: Former Smoker    Packs/day: 1.50    Years: 53.00    Pack years: 79.50    Types: Cigarettes    Last attempt to quit: 03/30/2008    Years since quitting: 9.3  . Smokeless tobacco: Never Used  . Tobacco comment: 1/4 PPD as of 2012  Substance Use Topics  . Alcohol use: No    Alcohol/week: 0.0 oz  . Drug use: No    Review of Systems  Constitutional: No fever. Eyes: No redness. ENT: No sore throat. Cardiovascular: Denies chest pain. Respiratory: Positive for shortness of breath. Gastrointestinal: No vomiting.  Genitourinary: Negative for dysuria.  Musculoskeletal: Negative for back pain. Skin: Negative for rash. Neurological: Negative for headache.   ____________________________________________   PHYSICAL EXAM:  VITAL SIGNS: ED Triage Vitals  Enc Vitals Group     BP 07/18/17 1405 (!) 154/58      Pulse Rate 07/18/17 1405 77     Resp 07/18/17 1405 18     Temp  07/18/17 1405 98.6 F (37 C)     Temp Source 07/18/17 1405 Oral     SpO2 07/18/17 1405 100 %     Weight 07/18/17 1406 145 lb (65.8 kg)     Height 07/18/17 1406 5\' 7"  (1.702 m)     Head Circumference --      Peak Flow --      Pain Score 07/18/17 1406 0     Pain Loc --      Pain Edu? --      Excl. in [place]? --     Constitutional: Alert and oriented.  Uncomfortable appearing but in no acute distress. Eyes: Conjunctivae are normal.  Head: Atraumatic. Nose: No congestion/rhinnorhea. Mouth/Throat: Mucous membranes are slightly dry.   Neck: Normal range of motion.  Cardiovascular: Normal rate, regular rhythm. Grossly normal heart sounds.  Good peripheral circulation. Respiratory: Normal respiratory effort.  No retractions.  Decreased and coarse breath sounds bilaterally. Gastrointestinal: Soft and nontender. No distention.  Genitourinary: No flank tenderness. Musculoskeletal: No lower extremity edema.  Extremities warm and well perfused.  Neurologic:  Normal speech and language. No gross focal neurologic deficits are appreciated.  Skin:  Skin is warm and dry. No rash noted. Psychiatric: Mood and affect are normal. Speech and behavior are normal.  ____________________________________________   LABS (all labs ordered are listed, but only abnormal results are displayed)  Labs Reviewed  BASIC METABOLIC PANEL - Abnormal; Notable for the following components:      Result Value   Potassium 2.9 (*)    Chloride 94 (*)    CO2 34 (*)    All other components within normal limits  CBC - Abnormal; Notable for the following components:   RDW 14.7 (*)    All other components within normal limits  TROPONIN I   ____________________________________________  EKG  ED ECG REPORT I, Arta Silence, the attending physician, personally viewed and interpreted this ECG.  Date: 07/18/2017 EKG Time: 1404 Rate: 78 Rhythm: normal  sinus rhythm QRS Axis: normal Intervals: normal ST/T Wave abnormalities: Nonspecific anterior abnormalities Narrative Interpretation: no evidence of acute ischemia  ____________________________________________  RADIOLOGY  CXR: No focal infiltrate; findings consistent with COPD  ____________________________________________   PROCEDURES  Procedure(s) performed: No  Procedures  Critical Care performed: No ____________________________________________   INITIAL IMPRESSION / ASSESSMENT AND PLAN / ED COURSE  Pertinent labs & imaging results that were available during my care of the patient were reviewed by me and considered in my medical decision making (see chart for details).  77 year old female with PMH as noted above presents with worsening shortness of breath over the course the day today, with cough productive of clear/white sputum.  No fever, chest pain, or vomiting.  Past medical records reviewed in Epic; patient's last admission was approximately 9 months ago for symptomatic bradycardia.  Presentation is most consistent with COPD exacerbation.  Differential also includes pneumonia, viral or bacterial bronchitis, or less likely cardiac etiology.  Plan: Chest x-ray, labs, nebs, and reassess.  Solu-Medrol already given by EMS.Marland Kitchen     ----------------------------------------- 3:14 PM on 07/18/2017 -----------------------------------------  Patient is feeling significantly better after nebs and the Solu-Medrol given by EMS.  On reassessment at 2 L her O2 sat is in the mid to high 90s.  She states she feels slightly short of breath but significantly better.  Chest x-ray shows no focal infiltrate or other acute findings.  Patient is also slightly hypokalemic.  The patient states she would prefer to go home if possible.  Since she has significantly improved, I think this would be reasonable.  I will give an additional nebulizer treatment, guaifenesin for cough, and plan to observe  for an additional 1 to 2 hours.  If the patient continues to feel better and maintains a normal O2 sat on her regular 2 L, then she would be safe for discharge home.  I am signing the patient out to the oncoming physician Dr. Joni Fears for reassessment.   ____________________________________________   FINAL CLINICAL IMPRESSION(S) / ED DIAGNOSES  Final diagnoses:  COPD exacerbation ([place])  Hypokalemia      NEW MEDICATIONS STARTED DURING THIS VISIT:  New Prescriptions   PREDNISONE (DELTASONE) 20 MG TABLET    Take 3 tablets (60 mg total) by mouth daily for 4 days.     Note:  This document was prepared using Dragon voice recognition software and may include unintentional dictation errors.    Arta Silence, MD 07/18/17 [phone]

## 2017-07-18 NOTE — ED Triage Notes (Signed)
Pt arrives [facility] from home for SOB since 8am. Pt has hx of COPD. Had used inhaler 3 times today already without relief. FD gave 1 albuterol, EMS gave 2 dounebs and 125 solumedrol. EMS reports pt wears 3 L nasal cannula at home. Pt reports productive cough, denies fever, congestion, or drainage. Pt is pursed lip breathing upon arrival. EMS reports diminished lung sounds with wheezes upper lungs. Alert, oriented upon arrival.

## 2017-07-18 NOTE — ED Provider Notes (Signed)
 -----------------------------------------   4:44 PM on 07/18/2017 -----------------------------------------  Patient reassessed. Oxygen saturation 98% on 2 L nasal cannula. She continues to feel better. Good air entry in all lung fields, normal work of breathing, persistent course expiration with slightly prolonged expiratory phase. Patient is comfortable continuing her care at home. Return precautions discussed. Suitable for discharge as per Dr. Kennith Gain plan.I will add azithromycin given her underlying structural lung disease at this point.  Final diagnoses:  COPD exacerbation ([place])  Hypokalemia      Carrie Mew, MD 07/18/17 1645

## 2017-07-18 NOTE — Discharge Instructions (Signed)
Take the prednisone as prescribed for the next 4 days, starting tomorrow.  You should use your nebulizer or inhaler every 4-6 hours for the next several days.  Call your primary care doctor within the next few days to arrange for follow-up within the next week.  Return to the ER for new, worsening, or persistent shortness of breath, weakness or lightheadedness, chest pain, or any other new or worsening symptoms that concern you.

## 2017-07-19 DIAGNOSIS — E538 Deficiency of other specified B group vitamins: Secondary | ICD-10-CM | POA: Diagnosis not present

## 2017-07-19 DIAGNOSIS — Z9981 Dependence on supplemental oxygen: Secondary | ICD-10-CM | POA: Diagnosis not present

## 2017-07-19 DIAGNOSIS — J439 Emphysema, unspecified: Secondary | ICD-10-CM | POA: Diagnosis not present

## 2017-07-19 DIAGNOSIS — E785 Hyperlipidemia, unspecified: Secondary | ICD-10-CM | POA: Diagnosis not present

## 2017-07-19 DIAGNOSIS — K219 Gastro-esophageal reflux disease without esophagitis: Secondary | ICD-10-CM | POA: Diagnosis not present

## 2017-07-19 DIAGNOSIS — I10 Essential (primary) hypertension: Secondary | ICD-10-CM | POA: Diagnosis not present

## 2017-07-19 DIAGNOSIS — I252 Old myocardial infarction: Secondary | ICD-10-CM | POA: Diagnosis not present

## 2017-07-19 DIAGNOSIS — Z7982 Long term (current) use of aspirin: Secondary | ICD-10-CM | POA: Diagnosis not present

## 2017-07-19 DIAGNOSIS — Z7902 Long term (current) use of antithrombotics/antiplatelets: Secondary | ICD-10-CM | POA: Diagnosis not present

## 2017-07-19 DIAGNOSIS — I2510 Atherosclerotic heart disease of native coronary artery without angina pectoris: Secondary | ICD-10-CM | POA: Diagnosis not present

## 2017-07-19 DIAGNOSIS — M810 Age-related osteoporosis without current pathological fracture: Secondary | ICD-10-CM | POA: Diagnosis not present

## 2017-07-20 DIAGNOSIS — Z9981 Dependence on supplemental oxygen: Secondary | ICD-10-CM | POA: Diagnosis not present

## 2017-07-20 DIAGNOSIS — J439 Emphysema, unspecified: Secondary | ICD-10-CM | POA: Diagnosis not present

## 2017-07-20 DIAGNOSIS — R05 Cough: Secondary | ICD-10-CM | POA: Diagnosis not present

## 2017-07-21 ENCOUNTER — Other Ambulatory Visit: Payer: Self-pay

## 2017-07-21 ENCOUNTER — Inpatient Hospital Stay
Admission: EM | Admit: 2017-07-21 | Discharge: 2017-07-26 | DRG: 190 | Disposition: A | Discharge status: Skilled Nursing Facility | Payer: Medicare Other | Attending: Internal Medicine | Admitting: Internal Medicine

## 2017-07-21 ENCOUNTER — Emergency Department: Payer: Medicare Other

## 2017-07-21 ENCOUNTER — Encounter: Payer: Self-pay | Admitting: Emergency Medicine

## 2017-07-21 ENCOUNTER — Other Ambulatory Visit: Payer: Self-pay | Admitting: Family Medicine

## 2017-07-21 DIAGNOSIS — Z87891 Personal history of nicotine dependence: Secondary | ICD-10-CM

## 2017-07-21 DIAGNOSIS — E876 Hypokalemia: Secondary | ICD-10-CM | POA: Diagnosis present

## 2017-07-21 DIAGNOSIS — Z9981 Dependence on supplemental oxygen: Secondary | ICD-10-CM

## 2017-07-21 DIAGNOSIS — Z85828 Personal history of other malignant neoplasm of skin: Secondary | ICD-10-CM

## 2017-07-21 DIAGNOSIS — Z888 Allergy status to other drugs, medicaments and biological substances status: Secondary | ICD-10-CM

## 2017-07-21 DIAGNOSIS — Z7951 Long term (current) use of inhaled steroids: Secondary | ICD-10-CM

## 2017-07-21 DIAGNOSIS — M1990 Unspecified osteoarthritis, unspecified site: Secondary | ICD-10-CM | POA: Diagnosis present

## 2017-07-21 DIAGNOSIS — K5900 Constipation, unspecified: Secondary | ICD-10-CM | POA: Diagnosis present

## 2017-07-21 DIAGNOSIS — E785 Hyperlipidemia, unspecified: Secondary | ICD-10-CM | POA: Diagnosis present

## 2017-07-21 DIAGNOSIS — Z886 Allergy status to analgesic agent status: Secondary | ICD-10-CM

## 2017-07-21 DIAGNOSIS — E538 Deficiency of other specified B group vitamins: Secondary | ICD-10-CM

## 2017-07-21 DIAGNOSIS — Z9012 Acquired absence of left breast and nipple: Secondary | ICD-10-CM

## 2017-07-21 DIAGNOSIS — F419 Anxiety disorder, unspecified: Secondary | ICD-10-CM | POA: Diagnosis present

## 2017-07-21 DIAGNOSIS — J209 Acute bronchitis, unspecified: Secondary | ICD-10-CM | POA: Diagnosis present

## 2017-07-21 DIAGNOSIS — K219 Gastro-esophageal reflux disease without esophagitis: Secondary | ICD-10-CM | POA: Diagnosis present

## 2017-07-21 DIAGNOSIS — Z825 Family history of asthma and other chronic lower respiratory diseases: Secondary | ICD-10-CM

## 2017-07-21 DIAGNOSIS — I252 Old myocardial infarction: Secondary | ICD-10-CM | POA: Diagnosis not present

## 2017-07-21 DIAGNOSIS — J9621 Acute and chronic respiratory failure with hypoxia: Secondary | ICD-10-CM | POA: Diagnosis present

## 2017-07-21 DIAGNOSIS — J440 Chronic obstructive pulmonary disease with acute lower respiratory infection: Secondary | ICD-10-CM | POA: Diagnosis present

## 2017-07-21 DIAGNOSIS — M810 Age-related osteoporosis without current pathological fracture: Secondary | ICD-10-CM | POA: Diagnosis not present

## 2017-07-21 DIAGNOSIS — Z79899 Other long term (current) drug therapy: Secondary | ICD-10-CM

## 2017-07-21 DIAGNOSIS — Z90710 Acquired absence of both cervix and uterus: Secondary | ICD-10-CM

## 2017-07-21 DIAGNOSIS — J441 Chronic obstructive pulmonary disease with (acute) exacerbation: Secondary | ICD-10-CM | POA: Diagnosis present

## 2017-07-21 DIAGNOSIS — J439 Emphysema, unspecified: Secondary | ICD-10-CM | POA: Diagnosis not present

## 2017-07-21 DIAGNOSIS — Z66 Do not resuscitate: Secondary | ICD-10-CM | POA: Diagnosis present

## 2017-07-21 DIAGNOSIS — Z880 Allergy status to penicillin: Secondary | ICD-10-CM

## 2017-07-21 DIAGNOSIS — J9601 Acute respiratory failure with hypoxia: Secondary | ICD-10-CM

## 2017-07-21 DIAGNOSIS — Z87442 Personal history of urinary calculi: Secondary | ICD-10-CM

## 2017-07-21 DIAGNOSIS — Z7902 Long term (current) use of antithrombotics/antiplatelets: Secondary | ICD-10-CM

## 2017-07-21 DIAGNOSIS — Z7982 Long term (current) use of aspirin: Secondary | ICD-10-CM

## 2017-07-21 DIAGNOSIS — I10 Essential (primary) hypertension: Secondary | ICD-10-CM | POA: Diagnosis present

## 2017-07-21 DIAGNOSIS — Z955 Presence of coronary angioplasty implant and graft: Secondary | ICD-10-CM

## 2017-07-21 DIAGNOSIS — Z8249 Family history of ischemic heart disease and other diseases of the circulatory system: Secondary | ICD-10-CM

## 2017-07-21 DIAGNOSIS — J9600 Acute respiratory failure, unspecified whether with hypoxia or hypercapnia: Secondary | ICD-10-CM | POA: Diagnosis present

## 2017-07-21 DIAGNOSIS — I119 Hypertensive heart disease without heart failure: Secondary | ICD-10-CM | POA: Diagnosis present

## 2017-07-21 DIAGNOSIS — G9349 Other encephalopathy: Secondary | ICD-10-CM | POA: Diagnosis present

## 2017-07-21 DIAGNOSIS — I2510 Atherosclerotic heart disease of native coronary artery without angina pectoris: Secondary | ICD-10-CM | POA: Diagnosis present

## 2017-07-21 DIAGNOSIS — J9622 Acute and chronic respiratory failure with hypercapnia: Secondary | ICD-10-CM | POA: Diagnosis present

## 2017-07-21 DIAGNOSIS — R0602 Shortness of breath: Secondary | ICD-10-CM

## 2017-07-21 DIAGNOSIS — F329 Major depressive disorder, single episode, unspecified: Secondary | ICD-10-CM | POA: Diagnosis present

## 2017-07-21 DIAGNOSIS — J9602 Acute respiratory failure with hypercapnia: Secondary | ICD-10-CM

## 2017-07-21 DIAGNOSIS — Z881 Allergy status to other antibiotic agents status: Secondary | ICD-10-CM

## 2017-07-21 LAB — URINALYSIS, COMPLETE (UACMP) WITH MICROSCOPIC
BACTERIA UA: NONE SEEN
BILIRUBIN URINE: NEGATIVE
Glucose, UA: NEGATIVE mg/dL
Hgb urine dipstick: NEGATIVE
KETONES UR: NEGATIVE mg/dL
LEUKOCYTES UA: NEGATIVE
NITRITE: NEGATIVE
PROTEIN: NEGATIVE mg/dL
Specific Gravity, Urine: 1.017 (ref 1.005–1.030)
Squamous Epithelial / LPF: NONE SEEN (ref 0–5)
pH: 5 (ref 5.0–8.0)

## 2017-07-21 LAB — BLOOD GAS, VENOUS
Acid-Base Excess: 11.6 mmol/L — ABNORMAL HIGH (ref 0.0–2.0)
Bicarbonate: 39 mmol/L — ABNORMAL HIGH (ref 20.0–28.0)
O2 Saturation: 70.6 %
Patient temperature: 37
pCO2, Ven: 63 mmHg — ABNORMAL HIGH (ref 44.0–60.0)
pH, Ven: 7.4 (ref 7.250–7.430)
pO2, Ven: 37 mmHg (ref 32.0–45.0)

## 2017-07-21 LAB — BASIC METABOLIC PANEL
Anion gap: 8 (ref 5–15)
BUN: 18 mg/dL (ref 6–20)
CHLORIDE: 96 mmol/L — AB (ref 101–111)
CO2: 36 mmol/L — AB (ref 22–32)
CREATININE: 0.85 mg/dL (ref 0.44–1.00)
Calcium: 9.3 mg/dL (ref 8.9–10.3)
GFR calc non Af Amer: >60 mL/min (ref >60)
GLUCOSE: 113 mg/dL — AB (ref 65–99)
Potassium: 3.3 mmol/L — ABNORMAL LOW (ref 3.5–5.1)
Sodium: 140 mmol/L (ref 135–145)

## 2017-07-21 LAB — CBC
HCT: 38.2 % (ref 35.0–47.0)
Hemoglobin: 12.6 g/dL (ref 12.0–16.0)
MCH: 29.4 pg (ref 26.0–34.0)
MCHC: 32.9 g/dL (ref 32.0–36.0)
MCV: 89.3 fL (ref 80.0–100.0)
Platelets: 273 10*3/uL (ref 150–440)
RBC: 4.28 MIL/uL (ref 3.80–5.20)
RDW: 14.9 % — ABNORMAL HIGH (ref 11.5–14.5)
WBC: 8.8 10*3/uL (ref 3.6–11.0)

## 2017-07-21 LAB — INFLUENZA PANEL BY PCR (TYPE A & B)
Influenza A By PCR: NEGATIVE
Influenza B By PCR: NEGATIVE

## 2017-07-21 LAB — LACTIC ACID, PLASMA
Lactic Acid, Venous: 0.9 mmol/L (ref 0.5–1.9)
Lactic Acid, Venous: 0.8 mmol/L (ref 0.5–1.9)

## 2017-07-21 MED ORDER — METHYLPREDNISOLONE SODIUM SUCC 125 MG IJ SOLR
125.0000 mg | Freq: Once | INTRAMUSCULAR | Status: AC
  Administered 2017-07-21: 125 mg via INTRAVENOUS
  Filled 2017-07-21: qty 2

## 2017-07-21 MED ORDER — IPRATROPIUM-ALBUTEROL 0.5-2.5 (3) MG/3ML IN SOLN
3.0000 mL | Freq: Four times a day (QID) | RESPIRATORY_TRACT | Status: DC
  Administered 2017-07-22 – 2017-07-25 (×12): 3 mL via RESPIRATORY_TRACT
  Filled 2017-07-21 (×15): qty 3

## 2017-07-21 MED ORDER — RANOLAZINE ER 500 MG PO TB12
500.0000 mg | ORAL_TABLET | Freq: Two times a day (BID) | ORAL | Status: DC
  Administered 2017-07-22 – 2017-07-26 (×10): 500 mg via ORAL
  Filled 2017-07-21 (×12): qty 1

## 2017-07-21 MED ORDER — FUROSEMIDE 40 MG PO TABS
40.0000 mg | ORAL_TABLET | Freq: Every day | ORAL | Status: DC
  Administered 2017-07-22 – 2017-07-26 (×5): 40 mg via ORAL
  Filled 2017-07-21: qty 2
  Filled 2017-07-21 (×5): qty 1

## 2017-07-21 MED ORDER — ACETAMINOPHEN 325 MG PO TABS: 650.0000 mg | ORAL_TABLET | Freq: Four times a day (QID) | ORAL | Status: DC | PRN

## 2017-07-21 MED ORDER — NITROGLYCERIN 0.4 MG SL SUBL: 0.4000 mg | SUBLINGUAL_TABLET | SUBLINGUAL | Status: DC | PRN

## 2017-07-21 MED ORDER — ALPRAZOLAM 0.5 MG PO TABS
0.5000 mg | ORAL_TABLET | Freq: Three times a day (TID) | ORAL | Status: DC | PRN
  Administered 2017-07-22 – 2017-07-24 (×6): 0.5 mg via ORAL
  Filled 2017-07-21 (×7): qty 1

## 2017-07-21 MED ORDER — LORAZEPAM 2 MG/ML IJ SOLN
INTRAMUSCULAR | Status: AC
  Filled 2017-07-21: qty 1

## 2017-07-21 MED ORDER — VENLAFAXINE HCL 37.5 MG PO TABS
37.5000 mg | ORAL_TABLET | Freq: Every day | ORAL | Status: DC
  Administered 2017-07-22 – 2017-07-26 (×5): 37.5 mg via ORAL
  Filled 2017-07-21 (×5): qty 1

## 2017-07-21 MED ORDER — METOPROLOL SUCCINATE ER 50 MG PO TB24
50.0000 mg | ORAL_TABLET | Freq: Every day | ORAL | Status: DC
  Administered 2017-07-22 – 2017-07-26 (×5): 50 mg via ORAL
  Filled 2017-07-21 (×5): qty 1

## 2017-07-21 MED ORDER — BISACODYL 5 MG PO TBEC: 5.0000 mg | DELAYED_RELEASE_TABLET | Freq: Every day | ORAL | Status: DC | PRN

## 2017-07-21 MED ORDER — DILTIAZEM HCL 30 MG PO TABS
60.0000 mg | ORAL_TABLET | Freq: Three times a day (TID) | ORAL | Status: DC
  Administered 2017-07-22 – 2017-07-26 (×14): 60 mg via ORAL
  Filled 2017-07-21 (×6): qty 2
  Filled 2017-07-21: qty 1
  Filled 2017-07-21: qty 2
  Filled 2017-07-21: qty 1
  Filled 2017-07-21 (×5): qty 2

## 2017-07-21 MED ORDER — ASPIRIN EC 81 MG PO TBEC
81.0000 mg | DELAYED_RELEASE_TABLET | Freq: Every day | ORAL | Status: DC
  Administered 2017-07-22 – 2017-07-26 (×5): 81 mg via ORAL
  Filled 2017-07-21 (×5): qty 1

## 2017-07-21 MED ORDER — ONDANSETRON HCL 4 MG PO TABS
4.0000 mg | ORAL_TABLET | Freq: Four times a day (QID) | ORAL | Status: DC | PRN
Start: 2017-07-21 — End: 2017-07-26

## 2017-07-21 MED ORDER — METHYLPREDNISOLONE SODIUM SUCC 125 MG IJ SOLR: 80.0000 mg | Freq: Four times a day (QID) | INTRAMUSCULAR | Status: DC

## 2017-07-21 MED ORDER — LEVOFLOXACIN IN D5W 500 MG/100ML IV SOLN
500.0000 mg | INTRAVENOUS | Status: DC
  Administered 2017-07-22: 500 mg via INTRAVENOUS
  Filled 2017-07-21 (×2): qty 100

## 2017-07-21 MED ORDER — DOCUSATE SODIUM 100 MG PO CAPS
100.0000 mg | ORAL_CAPSULE | Freq: Two times a day (BID) | ORAL | Status: DC
  Administered 2017-07-22 – 2017-07-26 (×9): 100 mg via ORAL
  Filled 2017-07-21 (×10): qty 1

## 2017-07-21 MED ORDER — CLOPIDOGREL BISULFATE 75 MG PO TABS
75.0000 mg | ORAL_TABLET | Freq: Every day | ORAL | Status: DC
  Administered 2017-07-22 – 2017-07-26 (×5): 75 mg via ORAL
  Filled 2017-07-21 (×5): qty 1

## 2017-07-21 MED ORDER — MAGNESIUM HYDROXIDE 400 MG/5ML PO SUSP
30.0000 mL | Freq: Every day | ORAL | Status: DC
  Filled 2017-07-21 (×5): qty 30

## 2017-07-21 MED ORDER — HYDROCODONE-ACETAMINOPHEN 5-325 MG PO TABS: 1.0000 | ORAL_TABLET | ORAL | Status: DC | PRN

## 2017-07-21 MED ORDER — IPRATROPIUM-ALBUTEROL 0.5-2.5 (3) MG/3ML IN SOLN
3.0000 mL | Freq: Once | RESPIRATORY_TRACT | Status: AC
  Administered 2017-07-21: 3 mL via RESPIRATORY_TRACT
  Filled 2017-07-21: qty 3

## 2017-07-21 MED ORDER — CYANOCOBALAMIN 1000 MCG/ML IJ SOLN: 1000.0000 ug | INTRAMUSCULAR | Status: DC

## 2017-07-21 MED ORDER — ATORVASTATIN CALCIUM 20 MG PO TABS
20.0000 mg | ORAL_TABLET | Freq: Every day | ORAL | Status: DC
  Administered 2017-07-22 – 2017-07-25 (×4): 20 mg via ORAL
  Filled 2017-07-21 (×4): qty 1

## 2017-07-21 MED ORDER — ONDANSETRON HCL 4 MG/2ML IJ SOLN: 4.0000 mg | Freq: Four times a day (QID) | INTRAMUSCULAR | Status: DC | PRN

## 2017-07-21 MED ORDER — SODIUM CHLORIDE 0.9 % IV SOLN
Freq: Once | INTRAVENOUS | Status: AC
  Administered 2017-07-22: via INTRAVENOUS

## 2017-07-21 MED ORDER — TRAZODONE HCL 50 MG PO TABS: 25.0000 mg | ORAL_TABLET | Freq: Every evening | ORAL | Status: DC | PRN

## 2017-07-21 MED ORDER — LORAZEPAM 2 MG/ML IJ SOLN
0.5000 mg | Freq: Once | INTRAMUSCULAR | Status: AC
  Administered 2017-07-21: 0.5 mg via INTRAVENOUS

## 2017-07-21 MED ORDER — ACETAMINOPHEN 650 MG RE SUPP: 650.0000 mg | Freq: Four times a day (QID) | RECTAL | Status: DC | PRN

## 2017-07-21 MED ORDER — HEPARIN SODIUM (PORCINE) 5000 UNIT/ML IJ SOLN
5000.0000 [IU] | Freq: Three times a day (TID) | INTRAMUSCULAR | Status: DC
  Administered 2017-07-22 (×2): 5000 [IU] via SUBCUTANEOUS
  Filled 2017-07-21 (×2): qty 1

## 2017-07-21 MED ORDER — POTASSIUM CHLORIDE ER 10 MEQ PO TBCR
10.0000 meq | EXTENDED_RELEASE_TABLET | Freq: Two times a day (BID) | ORAL | Status: DC
  Administered 2017-07-22 – 2017-07-26 (×10): 10 meq via ORAL
  Filled 2017-07-21 (×19): qty 1

## 2017-07-21 NOTE — ED Triage Notes (Signed)
Pt presents to ED via [facility] from home with c/o cough and SHOB with hx of COPD. Per EMS pt was seen 3 days ago and sent home with hydrocodone cough syrup. Since then patient has had worsening SHOB, is on chronic 4L via . Per EMS pt received 2 albuterol treatments from FD and was given 2 duonebs en route for wheezing. Pt is alert and oriented on arrival with strong, wet cough noted, pt states coughing up yellow phlegm.

## 2017-07-21 NOTE — Consult Note (Signed)
Name: Mckenzie Mclaughlin MRN: [id number] DOB: Jul 29, 1940    ADMISSION DATE:  07/21/2017 CONSULTATION DATE: 07/21/2017  REFERRING MD : Dr. Duane Boston   CHIEF COMPLAINT: Shortness of Breath  BRIEF PATIENT DESCRIPTION:  77 yo female admitted with acute on chronic hypercapnic respiratory failure secondary to AECOPD requiring Bipap   SIGNIFICANT EVENTS  04/24-Pt admitted to Stepdown Unit  STUDIES:  None   HISTORY OF PRESENT ILLNESS:   This is a 77 yo female with a PMH of Tobacco Abuse, Pneumothorax, Mental Disorder, HTN, Hyperlipidemia, Kidney Stones, GERD, Dysrhythmia, Esophageal Stricture and Dilatation, CAD, Severe COPD, Arthritis, Anxiety, Anginal Pain, Home O2 @4L , and Aortic Coarctation Stent Placement. She presented to [facility] ER 04/24 via EMS with cough and shortness of breath. When EMS arrived at pts home she received 2 albuterol treatments and 2 duoneb treatments due to significant wheezing. She was recently evaluated in the ER on 04/21 due to shortness of breath, however she did not require admission she was prescribed prednisone and azithromycin.  During current ER visit CXR and Influenza PCR negative.  Due to significant respiratory failure she was placed on Bipap.  She was subsequently admitted to the stepdown unit by hospitalist team for further workup and treatment.   PAST MEDICAL HISTORY :   has a past medical history of Anginal pain ([place]), Anxiety, Arthritis, Cancer ([place]), Chronic airway obstruction ([place]), Chronic obstructive asthma ([place]), Complication of anesthesia, Constipation, COPD (chronic obstructive pulmonary disease) ([place]), COPD with emphysema ([place]) (01/05/2013), COPD, severe (07/29/98), Coronary artery disease (01/22/99), Coronary atherosclerosis of native coronary artery (01/22/1999), Depression, Dysrhythmia, Esophageal dilatation, Esophageal stricture, GERD (gastroesophageal reflux disease) (08/29/98), Head injury, acute, with loss of consciousness ([place]) (11/2012), History of  blood transfusion, History of blurry vision (05/06-05/10/2009), History of CT scan of head (08/02/09), History of ETT (08/03/09), History of ETT (05/1999), History of ETT (09/02/01), History of ETT (04/28/2006), History of ETT (04/10/11), History of kidney stones (03/2017), History of pneumothorax (01/05/2013), cardiac catheterization (08/29/01), cardiac catheterization (01/22/99), Hyperlipidemia, unspecified, Hypertension, Mental disorder, On home oxygen therapy, Pneumothorax (2/14), PONV (postoperative nausea and vomiting), Shortness of breath, Status post aortic coarctation stent placement, and Tobacco abuse.  has a past surgical history that includes Laminectomy (1976); Esophageal dilation (06/00); Oophorectomy; Colonoscopy; Mastectomy (03/1976); Carotid stent (Right, 2000); Back surgery; Tissue expander placement (Right, 02/03/2013); Breast surgery; Cataract extraction (Bilateral); Mastectomy (Left); Total abdominal hysterectomy w/ bilateral salpingoophorectomy; Cardiac catheterization; Cystoscopy/ureteroscopy/holmium laser/stent placement (Left, 04/28/2017); and Cystoscopy w/ retrogrades (Bilateral, 04/28/2017). Prior to Admission medications   Medication Sig Start Date End Date Taking? Authorizing Provider  ALPRAZolam Duanne Moron) 0.5 MG tablet TAKE 1 TABLET BY MOUTH 3 TIMES A DAY AS NEEDED 05/27/17  Yes Tonia Ghent, MD  aspirin EC 81 MG tablet Take 81 mg by mouth daily.    Yes [provider]  atorvastatin (LIPITOR) 20 MG tablet Take 20 mg by mouth daily.   Yes [provider]  azithromycin (ZITHROMAX Z-PAK) 250 MG tablet Take 2 tablets (500 mg) on  Day 1,  followed by 1 tablet (250 mg) once daily on Days 2 through 5. 07/18/17  Yes Carrie Mew, MD  budesonide-formoterol [facility]) 160-4.5 MCG/ACT inhaler Inhale 2 puffs into the lungs 2 (two) times daily. 06/18/14  Yes Tonia Ghent, MD  clopidogrel (PLAVIX) 75 MG tablet TAKE 1 TABLET BY MOUTH ONCE A DAY 04/02/17  Yes Tonia Ghent, MD  diltiazem (CARDIZEM) 60 MG tablet Take 1 tablet (60 mg total) by mouth 3 (three) times daily. 06/18/17  Yes Tonia Ghent, MD  furosemide (LASIX) 40 MG tablet Take 1 tablet (40 mg total) by mouth daily. 10/05/16  Yes Epifanio Lesches, MD  metoprolol succinate (TOPROL-XL) 50 MG 24 hr tablet Take 1 tablet (50 mg total) by mouth daily. 06/18/17  Yes Tonia Ghent, MD  OXYGEN Inhale 2-3 L into the lungs continuous.   Yes [provider]  potassium chloride (K-DUR) 10 MEQ tablet Take 1 tablet (10 mEq total) by mouth 2 (two) times daily. 07/18/17  Yes Arta Silence, MD  predniSONE (DELTASONE) 20 MG tablet Take 3 tablets (60 mg total) by mouth daily for 4 days. 07/19/17 07/23/17 Yes Arta Silence, MD  PROAIR HFA 108 [phone] Base) MCG/ACT inhaler INHALE 2 PUFFS BY MOUTH EVERY 6 HOURS ASNEEDED 07/08/17  Yes Tonia Ghent, MD  ranolazine (RANEXA) 500 MG 12 hr tablet Take 1 tablet (500 mg total) by mouth 2 (two) times daily. 10/05/16  Yes Epifanio Lesches, MD  tiotropium (SPIRIVA HANDIHALER) 18 MCG inhalation capsule Inhale 70mcg once daily 07/04/14  Yes [provider]  venlafaxine (EFFEXOR) 37.5 MG tablet Take 37.5 mg by mouth daily.   Yes [provider]  docusate sodium (COLACE) 100 MG capsule Take 1 capsule (100 mg total) by mouth 2 (two) times daily. Patient not taking: Reported on 07/18/2017 04/28/17   Hollice Espy, MD  ipratropium-albuterol (DUONEB) 0.5-2.5 (3) MG/3ML SOLN Take 3 mLs by nebulization every 4 (four) hours as needed (shortness of breath). 06/18/17   Tonia Ghent, MD  magnesium hydroxide (MILK OF MAGNESIA) 400 MG/5ML suspension Take 30 mLs by mouth daily. Patient not taking: Reported on 07/18/2017 10/05/16   Epifanio Lesches, MD  nitroGLYCERIN (NITROSTAT) 0.4 MG SL tablet Place 1 tablet (0.4 mg total) under the tongue every 5 (five) minutes as needed for chest pain (max 3 doses in 15 min). 12/29/13   Tonia Ghent, MD   Allergies    Allergen Reactions  . Amoxicillin Shortness Of Breath    Has patient had a PCN reaction causing immediate rash, facial/tongue/throat swelling, SOB or lightheadedness with hypotension: Yes Has patient had a PCN reaction causing severe rash involving mucus membranes or skin necrosis: No Has patient had a PCN reaction that required hospitalization: No Has patient had a PCN reaction occurring within the last 10 years: No If all of the above answers are "NO", then may proceed with Cephalosporin use.   Marland Kitchen Doxycycline Nausea Only and Other (See Comments)    Dizziness   . Ibuprofen Nausea And Vomiting  . Sertraline Hcl Nausea And Vomiting    REACTION: trembling    FAMILY HISTORY:  family history includes COPD in her brother; Cancer in her father; Cirrhosis in her sister; Coronary artery disease in her sister; Heart disease in her brother, mother, and sister; Heart failure in her mother; Lung cancer in her brother and father; Stroke in her mother. SOCIAL HISTORY:  reports that she quit smoking about 9 years ago. Her smoking use included cigarettes. She has a 79.50 pack-year smoking history. She has never used smokeless tobacco. She reports that she does not drink alcohol or use drugs.  REVIEW OF SYSTEMS: Positives in BOLD  Constitutional: Negative for fever, chills, weight loss, malaise/fatigue and diaphoresis.  HENT: Negative for hearing loss, ear pain, nosebleeds, congestion, sore throat, neck pain, tinnitus and ear discharge.   Eyes: Negative for blurred vision, double vision, photophobia, pain, discharge and redness.  Respiratory: cough, hemoptysis, sputum production, shortness of breath, wheezing and stridor.  Cardiovascular: Negative for chest pain, palpitations, orthopnea, claudication, leg swelling and PND.  Gastrointestinal: Negative for heartburn, nausea, vomiting, abdominal pain, diarrhea, constipation, blood in stool and melena.  Genitourinary: Negative for dysuria, urgency,  frequency, hematuria and flank pain.  Musculoskeletal: Negative for myalgias, back pain, joint pain and falls.  Skin: Negative for itching and rash.  Neurological: Negative for dizziness, tingling, tremors, sensory change, speech change, focal weakness, seizures, loss of consciousness, weakness and headaches.  Endo/Heme/Allergies: Negative for environmental allergies and polydipsia. Does not bruise/bleed easily.  SUBJECTIVE:  No complaints at this time states breathing has improved  VITAL SIGNS: Temp:  [97.8 F (36.6 C)] 97.8 F (36.6 C) (04/24 1820) Pulse Rate:  [74-91] 91 (04/24 2004) Resp:  [15-21] 15 (04/24 2030) BP: (155-168)/(56-69) 155/69 (04/24 2030) SpO2:  [95 %-100 %] 100 % (04/24 2004) Weight:  [65.8 kg (145 lb)] 65.8 kg (145 lb) (04/24 1815)  PHYSICAL EXAMINATION: General: well developed, well nourished female, NAD on Bipap  Neuro: alert and oriented, follows commands HEENT: supple, no JVD  Cardiovascular: nsr, rrr, no M/R/G Lungs: diminished throughout with faint rhonchi, even, non labored  Abdomen: +BS x4, obese, taut, non tender, non distended  Musculoskeletal: normal bulk and tone, no edema  Skin: scattered ecchymosis, no rashes or lesions   Recent Labs  Lab 07/18/17 1412 07/21/17 1817  NA 135 140  K 2.9* 3.3*  CL 94* 96*  CO2 34* 36*  BUN 13 18  CREATININE 0.81 0.85  GLUCOSE 99 113*   Recent Labs  Lab 07/18/17 1412 07/21/17 1817  HGB 12.4 12.6  HCT 36.0 38.2  WBC 7.7 8.8  PLT 242 273   Dg Chest 2 View  Result Date: 07/21/2017 CLINICAL DATA:  Shortness of Breath EXAM: CHEST - 2 VIEW COMPARISON:  07/18/2017 FINDINGS: Mild hyperinflation/COPD. Heart is upper limits normal in size. No confluent opacities or effusions. No acute bony abnormality. IMPRESSION: Hyperinflation/COPD.  No active disease. Electronically Signed   By: Rolm Baptise M.D.   On: 07/21/2017 19:03    ASSESSMENT / PLAN: Acute on chronic hypercapnic respiratory failure secondary to  AECOPD  Hypokalemia  Hx: Former Smoker, HTN, Hyperlipidemia, Chronic Home O2, GERD, and Esophageal Stricture/Dilatation  P: Prn Bipap for dyspnea and/or hypoxia  Scheduled and prn bronchodilator therapy  IV and nebulized steroids Trend troponin's  Continuous telemetry monitoring  Continue outpatient cardiac medications  Trend WBC and monitor fever curve  Trend PCT  Continue levaquin  Trend BMP  Replace electrolytes Monitor UOP VTE px: subq heparin  Trend CBC Monitor for s/sx of bleeding and transfuse for hgb <7   Marda Stalker, [place] Pager [phone] (please enter 7 digits) PCCM Consult Pager [phone] (please enter 7 digits)

## 2017-07-21 NOTE — ED Notes (Signed)
Bi Pap back on, pt resting comfortably. Placed on bedpan for urine sample

## 2017-07-21 NOTE — Telephone Encounter (Signed)
Electronic refill request Last office visit 06/18/17 Last refill 05/27/17 #90/2

## 2017-07-21 NOTE — Telephone Encounter (Signed)
This is too early.  Denied.  Thanks.

## 2017-07-21 NOTE — ED Notes (Signed)
Pt has Bi pap off. States she can't wear it. Spoke to EDP. New orders

## 2017-07-21 NOTE — ED Provider Notes (Signed)
[facility] Emergency Department Provider Note  ____________________________________________  Time seen: Approximately 6:35 PM  I have reviewed the triage vital signs and the nursing notes.   HISTORY  Chief Complaint Shortness of Breath    HPI Mckenzie Mclaughlin is a 77 y.o. female with a history of COPD on 2 L nasal cannula at baseline, CAD status post stent on aspirin and Plavix, presenting for shortness of breath and cough.  The patient reports that she has had progressively worsening shortness of breath over the last 3 months.  She was evaluated here 3 days ago on 4/21 and was discharged home with a diagnosis of COPD exacerbation and a prescription for azithromycin and prednisone.  Her O2 sats were 98% on her home 2 L nasal cannula at discharge, and her chest x-ray did not show any infiltrate.  She did not fill her prescriptions until today, and afterwards her husband states that she became "drunk like," where she was somnolent.  She has had associated congestion and clear rhinorrhea, change in her phlegm which is now "more white,".  She denies any associated chest pain, lower extremity swelling or calf pain.  No lightheadedness or syncope, palpitations, nausea vomiting or diarrhea.  Past Medical History:  Diagnosis Date  . Anginal pain [facility])    see dr [name]  "Spams"  . Anxiety   . Arthritis   . Cancer (HCC)    side of head- skin cancer, squamous cell ca on left foot.  . Chronic airway obstruction (HCC)    chronic airway obstruction not elsewhere classified, unspecified  . Chronic obstructive asthma ([place])    unspecified  . Complication of anesthesia   . Constipation   . COPD (chronic obstructive pulmonary disease) ([place])   . COPD with emphysema ([place]) 01/05/2013  . COPD, severe 07/29/98  . Coronary artery disease 01/22/99   Non Q-wave MI, stent RCA  . Coronary atherosclerosis of native coronary artery 01/22/1999   status post stent RCA,   .  Depression   . Dysrhythmia    Palpations at times. last time 10/29 ish 2014  . Esophageal dilatation    Pt needs appple sauce to take meds.  . Esophageal stricture    PT needs apple sauce to take meds  . GERD (gastroesophageal reflux disease) 08/29/98   severe-no meds now  . Head injury, acute, with loss of consciousness ([place]) 11/2012   unsure how long  . History of blood transfusion   . History of blurry vision 05/06-05/10/2009   Hospital [facility] CP R/O'D Blurry vision, smoking  . History of CT scan of head 08/02/09   w/o mild age appropr atrophy  . History of ETT 08/03/09   Myoview Normal  . History of ETT 05/1999   Cardiolite pos ETT, neg Cardiolite  . History of ETT 09/02/01   Normal  . History of ETT 04/28/2006   Myoview normal EF 83%  . History of ETT 04/10/11   normal EF and no ischemia per Dr. Saralyn Pilar  . History of kidney stones 03/2017  . History of pneumothorax 01/05/2013  . Hx of cardiac catheterization 08/29/01   60% RCA 0/w 20-30% lesions  . Hx of cardiac catheterization 01/22/99   w/Stent 90% RCA lesion  . Hyperlipidemia, unspecified   . Hypertension    03/30/00  . Mental disorder   . On home oxygen therapy    as needed-has a travel tank  . Pneumothorax 2/14  . PONV (postoperative nausea and vomiting)  ad breast remocved and see was under anesthesia a long time  . Shortness of breath   . Status post aortic coarctation stent placement   . Tobacco abuse    1/4 pack daily    Patient Active Problem List   Diagnosis Date Noted  . Renal stone 06/20/2017  . Fall at home 04/05/2017  . Unstable angina ([place]) 09/25/2016  . Dizziness 05/14/2016  . Atrophic vaginitis 02/28/2015  . Gross hematuria 02/26/2015  . Radicular pain in right arm 11/21/2014  . Pupil asymmetry 11/16/2014  . Osteoporosis 11/16/2014  . Senile purpura ([place]) 11/16/2014  . Right hand pain 11/16/2014  . Advance care planning 08/15/2014  . Other specified counseling 08/15/2014  .  Constipation 06/21/2014  . NSTEMI (non-ST elevated myocardial infarction) ([place]) 12/17/2013  . Acute subendocardial infarction ([place]) 12/17/2013  . History of cardiac catheterization 09/15/2013  . Chest pain on exertion 08/25/2013  . Medicare annual wellness visit, subsequent 07/09/2013  . Beat, premature ventricular 06/26/2013  . Awareness of heartbeats 06/26/2013  . Arteriosclerosis of coronary artery 01/05/2013  . Pulmonary emphysema ([place]) 01/05/2013  . Acid reflux 01/05/2013  . H/O pneumothorax 01/05/2013  . Neck pain 08/31/2012  . Anxiety and depression 07/11/2012  . Dysthymia 07/11/2012  . Esophageal stricture   . Nutritional marasmus ([place]) 06/22/2012  . Lung nodule 04/17/2012  . Edema of foot 04/01/2012  . Paresthesia 10/27/2011  . Atypical chest pain 05/13/2011  . HLD (hyperlipidemia) 02/26/2011  . History of mammogram 02/26/2011  . Vertigo 02/18/2011  . B12 deficiency 01/31/2009  . HEADACHE 10/22/2006  . PREMATURE VENTRICULAR CONTRACTIONS, FREQUENT 07/19/2006  . HYPERTENSION 03/30/2000  . Essential (primary) hypertension 03/30/2000  . CORONARY ARTERY DISEASE 01/22/1999  . Atherosclerosis of coronary artery 01/22/1999  . GERD 08/29/1998  . Chronic obstructive pulmonary disease ([place]) 07/29/1998    Past Surgical History:  Procedure Laterality Date  . BACK SURGERY    . BREAST SURGERY     implant removal, R breast  . CARDIAC CATHETERIZATION    . CAROTID STENT Right 2000  . CATARACT EXTRACTION Bilateral    2016  . COLONOSCOPY    . CYSTOSCOPY W/ RETROGRADES Bilateral 04/28/2017   Procedure: CYSTOSCOPY WITH RETROGRADE PYELOGRAM;  Surgeon: Hollice Espy, MD;  Location: [facility] ORS;  Service: Urology;  Laterality: Bilateral;  . CYSTOSCOPY/URETEROSCOPY/HOLMIUM LASER/STENT PLACEMENT Left 04/28/2017   Procedure: CYSTOSCOPY/URETEROSCOPY/HOLMIUM LASER/STENT PLACEMENT;  Surgeon: Hollice Espy, MD;  Location: [facility] ORS;  Service: Urology;  Laterality: Left;  . ESOPHAGEAL DILATION   06/00   EGD  . LAMINECTOMY  1976   Disc Removal X 2 Lumbar  . MASTECTOMY  03/1976   Bilateral due FCBD with implants [facility])  . MASTECTOMY Left    due to fibrocystic breast disease  . OOPHORECTOMY    . TISSUE EXPANDER PLACEMENT Right 02/03/2013   Procedure: REMOVAL OF RIGHT BREAST IMPLANT AND IMPLANT MATERIAL  CAPSULECTOMY;  Surgeon: Irene Limbo, MD;  Location: [facility];  Service: Plastics;  Laterality: Right;  . TOTAL ABDOMINAL HYSTERECTOMY W/ BILATERAL SALPINGOOPHORECTOMY      Current Outpatient Rx  . Order #: [id number] Class: Normal  . Order #: [id number] Class: Historical Med  . Order #: [id number] Class: Historical Med  . Order #: [id number] Class: Print  . Order #: [id number] Class: Normal  . Order #: [id number] Class: Normal  . Order #: [id number] Class: No Print  . Order #: [id number] Class: Normal  . Order #: [id number] Class: No Print  . Order #: [id number] Class: Historical Med  . Order #: [id number] Class: Print  . Order #:  [id number] Class: Print  . Order #: [id number] Class: Normal  . Order #: [id number] Class: Normal  . Order #: [id number] Class: Historical Med  . Order #: [id number] Class: Historical Med  . Order #: [id number] Class: Print  . Order #: [id number] Class: No Print  . Order #: [id number] Class: Normal  . Order #: [id number] Class: Normal    Allergies Amoxicillin; Doxycycline; Ibuprofen; and Sertraline hcl  Family History  Problem Relation Age of Onset  . Stroke Mother   . Heart disease Mother        MI  . Heart failure Mother   . Cancer Father        Lung  . Lung cancer Father   . COPD Brother   . Lung cancer Brother   . Heart disease Brother        CAD  . Heart disease Sister        cirrhosis due heart disease  . Cirrhosis Sister   . Coronary artery disease Sister   . Colon cancer Neg Hx   . Breast cancer Neg Hx     Social History Social History   Tobacco Use  . Smoking status: Former Smoker    Packs/day: 1.50    Years: 53.00    Pack years: 79.50     Types: Cigarettes    Last attempt to quit: 03/30/2008    Years since quitting: 9.3  . Smokeless tobacco: Never Used  . Tobacco comment: 1/4 PPD as of 2012  Substance Use Topics  . Alcohol use: No    Alcohol/week: 0.0 oz  . Drug use: No    Review of Systems Constitutional: No fever/chills.  No lightheadedness or syncope.  Positive somnolence this afternoon. Eyes: No visual changes. ENT: No sore throat.  Positive congestion and rhinorrhea. Cardiovascular: Denies chest pain. Denies palpitations. Respiratory: Positive shortness of breath.  Positive productive cough. Gastrointestinal: No abdominal pain.  No nausea, no vomiting.  No diarrhea.  No constipation. Genitourinary: Negative for dysuria. Musculoskeletal: Negative for back pain.  No lower extremity swelling or calf pain. Skin: Negative for rash. Neurological: Negative for headaches. No focal numbness, tingling or weakness.     ____________________________________________   PHYSICAL EXAM:  VITAL SIGNS: ED Triage Vitals  Enc Vitals Group     BP 07/21/17 1820 (!) 155/56     Pulse Rate 07/21/17 1820 74     Resp 07/21/17 1820 19     Temp 07/21/17 1820 97.8 F (36.6 C)     Temp Source 07/21/17 1820 Oral     SpO2 07/21/17 1820 99 %     Weight 07/21/17 1815 145 lb (65.8 kg)     Height 07/21/17 1815 5\' 7"  (1.702 m)     Head Circumference --      Peak Flow --      Pain Score 07/21/17 1815 0     Pain Loc --      Pain Edu? --      Excl. in [place]? --     Constitutional: Alert and oriented.  Likely ill-appearing but nontoxic. Answers questions appropriately. Eyes: Conjunctivae are normal.  EOMI. No scleral icterus.  No eye discharge. Head: Atraumatic. Nose: No congestion/rhinnorhea. Mouth/Throat: Mucous membranes are moist.  Neck: No stridor.  Supple.  No JVD.  No meningismus. Cardiovascular: Normal rate, regular rhythm. No murmurs, rubs or gallops.  Respiratory: The patient is tachypneic with accessory muscle use and  retractions.  Her O2 sats 100% on 4 L and I have weaned her down to 2 L with maintenance  of O2 saturations of greater than 98%.  She is able to speak in 3-4 word sentences.  She has rales at the bases bilaterally.  She has no wheezes or rhonchi. Gastrointestinal: Soft, nontender and nondistended.  No guarding or rebound.  No peritoneal signs. Musculoskeletal: No LE edema. No ttp in the calves or palpable cords.  Negative Homan's sign. Neurologic:  A&Ox3.  Speech is clear.  Face and smile are symmetric.  EOMI.  Moves all extremities well. Skin:  Skin is warm, dry and intact. No rash noted. Psychiatric: Mood and affect are normal. ____________________________________________   LABS (all labs ordered are listed, but only abnormal results are displayed)  Labs Reviewed  BASIC METABOLIC PANEL - Abnormal; Notable for the following components:      Result Value   Potassium 3.3 (*)    Chloride 96 (*)    CO2 36 (*)    Glucose, Bld 113 (*)    All other components within normal limits  CBC - Abnormal; Notable for the following components:   RDW 14.9 (*)    All other components within normal limits  BLOOD GAS, VENOUS - Abnormal; Notable for the following components:   pCO2, Ven 78 (*)    pO2, Ven <31.0 (*)    Bicarbonate 41.1 (*)    Acid-Base Excess 11.9 (*)    All other components within normal limits  LACTIC ACID, PLASMA  INFLUENZA PANEL BY PCR (TYPE A & B)  LACTIC ACID, PLASMA  TROPONIN I  BRAIN NATRIURETIC PEPTIDE  URINALYSIS, COMPLETE (UACMP) WITH MICROSCOPIC  BLOOD GAS, VENOUS   ____________________________________________  EKG  ED ECG REPORT I, Eula Listen, the attending physician, personally viewed and interpreted this ECG.   Date: 07/21/2017  EKG Time: 1819  Rate: 76  Rhythm: normal sinus rhythm  Axis: normal  Intervals:none  ST&T Change: no STEMI  ____________________________________________  RADIOLOGY  Dg Chest 2 View  Result Date: 07/21/2017 CLINICAL  DATA:  Shortness of Breath EXAM: CHEST - 2 VIEW COMPARISON:  07/18/2017 FINDINGS: Mild hyperinflation/COPD. Heart is upper limits normal in size. No confluent opacities or effusions. No acute bony abnormality. IMPRESSION: Hyperinflation/COPD.  No active disease. Electronically Signed   By: Rolm Baptise M.D.   On: 07/21/2017 19:03    ____________________________________________   PROCEDURES  Procedure(s) performed: None  Procedures  Critical Care performed: Yes ____________________________________________   INITIAL IMPRESSION / ASSESSMENT AND PLAN / ED COURSE  Pertinent labs & imaging results that were available during my care of the patient were reviewed by me and considered in my medical decision making (see chart for details).  77 y.o. female with a history of COPD, CAD, presenting with 3 months of progressively worsening shortness of breath, today with change in sputum production in her cough, and some somnolence.  Overall, the patient's O2 saturations are reassuring at 98% on her 2 L nasal cannula.  However she does have rales in the bases bilaterally and I am concerned about pneumonia, especially because she did not start the antibiotic or the steroids that she was prescribed.  Her described somnolence by her husband may be due to infection as well.  We will get a urinalysis, basic laboratory studies including troponin and BNP to evaluate for any cardiac causes of shortness of breath.  The patient will receive a DuoNeb and Solu-Medrol, and be reevaluated after her chest x-ray for final disposition.  An ambulatory pulse oximetry evaluation has been ordered as well.  ----------------------------------------- 8:31 PM on 07/21/2017 -----------------------------------------  The  patient has a gas which shows market hypercarbia and hypoxia; this could explain her somnolence.  She has been placed on BiPAP and will recheck her mental status as well as VBG within an hour.  I am still awaiting  the results of urinalysis.  At this time, the patient will be admitted for continued evaluation and treatment.  CRITICAL CARE Performed by: Eula Listen   Total critical care time: 35 minutes  Critical care time was exclusive of separately billable procedures and treating other patients.  Critical care was necessary to treat or prevent imminent or life-threatening deterioration.  Critical care was time spent personally by me on the following activities: development of treatment plan with patient and/or surrogate as well as nursing, discussions with consultants, evaluation of patient's response to treatment, examination of patient, obtaining history from patient or surrogate, ordering and performing treatments and interventions, ordering and review of laboratory studies, ordering and review of radiographic studies, pulse oximetry and re-evaluation of patient's condition.   ____________________________________________  FINAL CLINICAL IMPRESSION(S) / ED DIAGNOSES  Final diagnoses:  COPD exacerbation ([place])  Acute respiratory failure with hypoxia and hypercarbia ([place])         NEW MEDICATIONS STARTED DURING THIS VISIT:  New Prescriptions   No medications on file      Eula Listen, MD 07/21/17 2032

## 2017-07-21 NOTE — Progress Notes (Signed)
Pt transported to ICU bed 1 on BiPAP with out incident

## 2017-07-22 LAB — BRAIN NATRIURETIC PEPTIDE: B NATRIURETIC PEPTIDE 5: 152 pg/mL — AB (ref 0.0–100.0)

## 2017-07-22 LAB — BASIC METABOLIC PANEL
ANION GAP: 5 (ref 5–15)
ANION GAP: 7 (ref 5–15)
BUN: 17 mg/dL (ref 6–20)
BUN: 15 mg/dL (ref 6–20)
CALCIUM: 9 mg/dL (ref 8.9–10.3)
CHLORIDE: 95 mmol/L — AB (ref 101–111)
CO2: 33 mmol/L — ABNORMAL HIGH (ref 22–32)
CO2: 34 mmol/L — ABNORMAL HIGH (ref 22–32)
CREATININE: 0.69 mg/dL (ref 0.44–1.00)
Calcium: 9.2 mg/dL (ref 8.9–10.3)
Chloride: 98 mmol/L — ABNORMAL LOW (ref 101–111)
Creatinine, Ser: 0.8 mg/dL (ref 0.44–1.00)
GFR calc non Af Amer: >60 mL/min (ref >60)
Glucose, Bld: 149 mg/dL — ABNORMAL HIGH (ref 65–99)
Glucose, Bld: 147 mg/dL — ABNORMAL HIGH (ref 65–99)
POTASSIUM: 3.3 mmol/L — AB (ref 3.5–5.1)
Potassium: 3.7 mmol/L (ref 3.5–5.1)
SODIUM: 135 mmol/L (ref 135–145)
SODIUM: 137 mmol/L (ref 135–145)

## 2017-07-22 LAB — BLOOD GAS, VENOUS
Acid-Base Excess: 11.9 mmol/L — ABNORMAL HIGH (ref 0.0–2.0)
Bicarbonate: 41.1 mmol/L — ABNORMAL HIGH (ref 20.0–28.0)
Patient temperature: 37
pCO2, Ven: 78 mmHg (ref 44.0–60.0)
pH, Ven: 7.33 (ref 7.250–7.430)
pO2, Ven: <31 mmHg — CL (ref 32.0–45.0)

## 2017-07-22 LAB — MAGNESIUM: Magnesium: 1.6 mg/dL — ABNORMAL LOW (ref 1.7–2.4)

## 2017-07-22 LAB — GLUCOSE, CAPILLARY
Glucose-Capillary: 134 mg/dL — ABNORMAL HIGH (ref 65–99)
Glucose-Capillary: 144 mg/dL — ABNORMAL HIGH (ref 65–99)

## 2017-07-22 LAB — CBC
HEMATOCRIT: 38 % (ref 35.0–47.0)
HEMOGLOBIN: 12.7 g/dL (ref 12.0–16.0)
MCH: 29 pg (ref 26.0–34.0)
MCHC: 33.4 g/dL (ref 32.0–36.0)
MCV: 86.8 fL (ref 80.0–100.0)
Platelets: 228 10*3/uL (ref 150–440)
RBC: 4.38 MIL/uL (ref 3.80–5.20)
RDW: 14.5 % (ref 11.5–14.5)
WBC: 7.1 10*3/uL (ref 3.6–11.0)

## 2017-07-22 LAB — MRSA PCR SCREENING: MRSA BY PCR: NEGATIVE

## 2017-07-22 LAB — PROCALCITONIN: Procalcitonin: <0.1 ng/mL

## 2017-07-22 LAB — TROPONIN I: Troponin I: <0.03 ng/mL (ref <0.03)

## 2017-07-22 MED ORDER — BUDESONIDE 0.25 MG/2ML IN SUSP
0.2500 mg | Freq: Two times a day (BID) | RESPIRATORY_TRACT | Status: DC
  Administered 2017-07-22: 0.25 mg via RESPIRATORY_TRACT
  Filled 2017-07-22: qty 2

## 2017-07-22 MED ORDER — MAGNESIUM SULFATE 2 GM/50ML IV SOLN
2.0000 g | Freq: Once | INTRAVENOUS | Status: AC
Start: 2017-07-22 — End: 2017-07-22
  Administered 2017-07-22: 2 g via INTRAVENOUS
  Filled 2017-07-22: qty 50

## 2017-07-22 MED ORDER — ENOXAPARIN SODIUM 40 MG/0.4ML ~~LOC~~ SOLN
40.0000 mg | SUBCUTANEOUS | Status: DC
  Administered 2017-07-22 – 2017-07-26 (×5): 40 mg via SUBCUTANEOUS
  Filled 2017-07-22 (×5): qty 0.4

## 2017-07-22 MED ORDER — LEVOFLOXACIN 500 MG PO TABS
500.0000 mg | ORAL_TABLET | Freq: Every day | ORAL | Status: DC
  Administered 2017-07-23 – 2017-07-26 (×4): 500 mg via ORAL
  Filled 2017-07-22 (×4): qty 1

## 2017-07-22 MED ORDER — METHYLPREDNISOLONE SODIUM SUCC 40 MG IJ SOLR: 40.0000 mg | INTRAMUSCULAR | Status: DC

## 2017-07-22 MED ORDER — POTASSIUM CHLORIDE 10 MEQ/100ML IV SOLN
10.0000 meq | INTRAVENOUS | Status: DC
  Administered 2017-07-22 (×3): 10 meq via INTRAVENOUS
  Filled 2017-07-22 (×4): qty 100

## 2017-07-22 MED ORDER — POTASSIUM CHLORIDE IN NACL 40-0.9 MEQ/L-% IV SOLN
INTRAVENOUS | Status: DC
  Filled 2017-07-22: qty 1000

## 2017-07-22 MED ORDER — IPRATROPIUM-ALBUTEROL 0.5-2.5 (3) MG/3ML IN SOLN
3.0000 mL | RESPIRATORY_TRACT | Status: DC | PRN
  Administered 2017-07-22 – 2017-07-25 (×7): 3 mL via RESPIRATORY_TRACT
  Filled 2017-07-22 (×6): qty 3

## 2017-07-22 MED ORDER — METHYLPREDNISOLONE SODIUM SUCC 40 MG IJ SOLR
40.0000 mg | Freq: Two times a day (BID) | INTRAMUSCULAR | Status: DC
  Administered 2017-07-22 – 2017-07-26 (×9): 40 mg via INTRAVENOUS
  Filled 2017-07-22 (×9): qty 1

## 2017-07-22 MED ORDER — BUDESONIDE 0.5 MG/2ML IN SUSP
0.5000 mg | Freq: Two times a day (BID) | RESPIRATORY_TRACT | Status: DC
  Administered 2017-07-22 – 2017-07-26 (×8): 0.5 mg via RESPIRATORY_TRACT
  Filled 2017-07-22 (×8): qty 2

## 2017-07-22 NOTE — Progress Notes (Addendum)
MEDICATION RELATED CONSULT NOTE - INITIAL   Pharmacy Consult for Electrolyte management Indication: hypokalemia  Allergies  Allergen Reactions  . Amoxicillin Shortness Of Breath    Has patient had a PCN reaction causing immediate rash, facial/tongue/throat swelling, SOB or lightheadedness with hypotension: Yes Has patient had a PCN reaction causing severe rash involving mucus membranes or skin necrosis: No Has patient had a PCN reaction that required hospitalization: No Has patient had a PCN reaction occurring within the last 10 years: No If all of the above answers are "NO", then may proceed with Cephalosporin use.   Marland Kitchen Doxycycline Nausea Only and Other (See Comments)    Dizziness   . Ibuprofen Nausea And Vomiting  . Sertraline Hcl Nausea And Vomiting    REACTION: trembling    Patient Measurements: Height: 5\' 7"  (170.2 cm) Weight: 147 lb 4.3 oz (66.8 kg) IBW/kg (Calculated) : 61.6  Vital Signs: Temp: 97.5 F (36.4 C) (04/25 0000) Temp Source: Axillary (04/25 0000) BP: 141/61 (04/25 0128) Pulse Rate: 94 (04/25 0035) Intake/Output from previous day: No intake/output data recorded. Intake/Output from this shift: No intake/output data recorded.  Labs: Recent Labs    07/21/17 1817 07/21/17 2357  WBC 8.8 7.1  HGB 12.6 12.7  HCT 38.2 38.0  PLT 273 228  CREATININE 0.85 0.80   Estimated Creatinine Clearance: 60 mL/min (by C-G formula based on SCr of 0.8 mg/dL).   Microbiology: No results found for this or any previous visit (from the past 720 hour(s)).  Medical History: Past Medical History:  Diagnosis Date  . Anginal pain [facility])    see dr [name]  "Spams"  . Anxiety   . Arthritis   . Cancer (HCC)    side of head- skin cancer, squamous cell ca on left foot.  . Chronic airway obstruction (HCC)    chronic airway obstruction not elsewhere classified, unspecified  . Chronic obstructive asthma ([place])    unspecified  . Complication of anesthesia   .  Constipation   . COPD (chronic obstructive pulmonary disease) ([place])   . COPD with emphysema ([place]) 01/05/2013  . COPD, severe 07/29/98  . Coronary artery disease 01/22/99   Non Q-wave MI, stent RCA  . Coronary atherosclerosis of native coronary artery 01/22/1999   status post stent RCA,   . Depression   . Dysrhythmia    Palpations at times. last time 10/29 ish 2014  . Esophageal dilatation    Pt needs appple sauce to take meds.  . Esophageal stricture    PT needs apple sauce to take meds  . GERD (gastroesophageal reflux disease) 08/29/98   severe-no meds now  . Head injury, acute, with loss of consciousness ([place]) 11/2012   unsure how long  . History of blood transfusion   . History of blurry vision 05/06-05/10/2009   Hospital [facility] CP R/O'D Blurry vision, smoking  . History of CT scan of head 08/02/09   w/o mild age appropr atrophy  . History of ETT 08/03/09   Myoview Normal  . History of ETT 05/1999   Cardiolite pos ETT, neg Cardiolite  . History of ETT 09/02/01   Normal  . History of ETT 04/28/2006   Myoview normal EF 83%  . History of ETT 04/10/11   normal EF and no ischemia per Dr. Saralyn Pilar  . History of kidney stones 03/2017  . History of pneumothorax 01/05/2013  . Hx of cardiac catheterization 08/29/01   60% RCA 0/w 20-30% lesions  . Hx of cardiac catheterization 01/22/99  w/Stent 90% RCA lesion  . Hyperlipidemia, unspecified   . Hypertension    03/30/00  . Mental disorder   . On home oxygen therapy    as needed-has a travel tank  . Pneumothorax 2/14  . PONV (postoperative nausea and vomiting)    ad breast remocved and see was under anesthesia a long time  . Shortness of breath   . Status post aortic coarctation stent placement   . Tobacco abuse    1/4 pack daily    Medications:  Scheduled:  . aspirin EC  81 mg Oral Daily  . atorvastatin  20 mg Oral Daily  . budesonide (PULMICORT) nebulizer solution  0.25 mg Nebulization BID  . clopidogrel  75 mg Oral  Daily  . [START ON 07/30/2017] cyanocobalamin  1,000 mcg Intramuscular Q30 days  . diltiazem  60 mg Oral TID  . docusate sodium  100 mg Oral BID  . furosemide  40 mg Oral Daily  . heparin  5,000 Units Subcutaneous Q8H  . ipratropium-albuterol  3 mL Nebulization Q6H  . LORazepam      . magnesium hydroxide  30 mL Oral Daily  . methylPREDNISolone (SOLU-MEDROL) injection  40 mg Intravenous Q12H  . metoprolol succinate  50 mg Oral Daily  . potassium chloride  10 mEq Oral BID  . ranolazine  500 mg Oral BID  . venlafaxine  37.5 mg Oral Daily    Assessment: Patient admitted for SOB w/ h/o COPD on home O2. Patient's K+ 3.3 on initial BMP  Goal of Therapy:  K+ > 4.0  Plan:  Will supplement w/ KCI 10 mEq IV x 4 and will recheck electrolytes w/ am labs.  Tobie Lords, PharmD, BCPS Clinical Pharmacist 07/22/2017

## 2017-07-22 NOTE — H&P (Signed)
[place] [facility] [place] NAME: Mckenzie Mclaughlin    MR#:  [id number]  DATE OF BIRTH:  01-28-1941  DATE OF ADMISSION:  07/21/2017  PRIMARY CARE PHYSICIAN: Tonia Ghent, MD   REQUESTING/REFERRING PHYSICIAN:   CHIEF COMPLAINT:   Chief Complaint  Patient presents with  . Shortness of Breath    HISTORY OF PRESENT ILLNESS: Mckenzie Mclaughlin  is a 77 y.o. female with a known history of CAD, COPD on continuous 2 L oxygen, at home.  Patient is not able to provide any history due to confusion.  Most of the information was taken from reviewing the medical records and from discussion with emergency room physician and the family. Patient was brought to emergency room for acute altered mental status, cough and shortness of breath noticed by family, earlier today.  BiPAP treatment in the emergency room seems to help. She was seen in the emergency room 3 days ago for respiratory distress, that was thought to be secondary to COPD exacerbation.  Patient was discharged home on a azithromycin and prednisone, which she did not get a chance to start, yet. Blood test done in the emergency room are notable for elevated CO2 at 78 and low O2 at 31on venous ABG.  Potassium is low at 3.3.  WBC count is normal.  Influenza panel is negative.  EKG is noted without any acute changes; normal sinus rhythm. Chest x-ray, reviewed by myself, shows hyperinflation secondary to COPD, but no infiltrates. Patient is admitted for further evaluation and treatment.  PAST MEDICAL HISTORY:   Past Medical History:  Diagnosis Date  . Anginal pain [facility])    see dr [name]  "Spams"  . Anxiety   . Arthritis   . Cancer (HCC)    side of head- skin cancer, squamous cell ca on left foot.  . Chronic airway obstruction (HCC)    chronic airway obstruction not elsewhere classified, unspecified  . Chronic obstructive asthma ([place])    unspecified  . Complication of anesthesia   . Constipation    . COPD (chronic obstructive pulmonary disease) ([place])   . COPD with emphysema ([place]) 01/05/2013  . COPD, severe 07/29/98  . Coronary artery disease 01/22/99   Non Q-wave MI, stent RCA  . Coronary atherosclerosis of native coronary artery 01/22/1999   status post stent RCA,   . Depression   . Dysrhythmia    Palpations at times. last time 10/29 ish 2014  . Esophageal dilatation    Pt needs appple sauce to take meds.  . Esophageal stricture    PT needs apple sauce to take meds  . GERD (gastroesophageal reflux disease) 08/29/98   severe-no meds now  . Head injury, acute, with loss of consciousness ([place]) 11/2012   unsure how long  . History of blood transfusion   . History of blurry vision 05/06-05/10/2009   Hospital [facility] CP R/O'D Blurry vision, smoking  . History of CT scan of head 08/02/09   w/o mild age appropr atrophy  . History of ETT 08/03/09   Myoview Normal  . History of ETT 05/1999   Cardiolite pos ETT, neg Cardiolite  . History of ETT 09/02/01   Normal  . History of ETT 04/28/2006   Myoview normal EF 83%  . History of ETT 04/10/11   normal EF and no ischemia per Dr. Saralyn Pilar  . History of kidney stones 03/2017  . History of pneumothorax 01/05/2013  . Hx of cardiac catheterization 08/29/01  60% RCA 0/w 20-30% lesions  . Hx of cardiac catheterization 01/22/99   w/Stent 90% RCA lesion  . Hyperlipidemia, unspecified   . Hypertension    03/30/00  . Mental disorder   . On home oxygen therapy    as needed-has a travel tank  . Pneumothorax 2/14  . PONV (postoperative nausea and vomiting)    ad breast remocved and see was under anesthesia a long time  . Shortness of breath   . Status post aortic coarctation stent placement   . Tobacco abuse    1/4 pack daily    PAST SURGICAL HISTORY:  Past Surgical History:  Procedure Laterality Date  . BACK SURGERY    . BREAST SURGERY     implant removal, R breast  . CARDIAC CATHETERIZATION    . CAROTID STENT Right 2000  .  CATARACT EXTRACTION Bilateral    2016  . COLONOSCOPY    . CYSTOSCOPY W/ RETROGRADES Bilateral 04/28/2017   Procedure: CYSTOSCOPY WITH RETROGRADE PYELOGRAM;  Surgeon: Hollice Espy, MD;  Location: [facility] ORS;  Service: Urology;  Laterality: Bilateral;  . CYSTOSCOPY/URETEROSCOPY/HOLMIUM LASER/STENT PLACEMENT Left 04/28/2017   Procedure: CYSTOSCOPY/URETEROSCOPY/HOLMIUM LASER/STENT PLACEMENT;  Surgeon: Hollice Espy, MD;  Location: [facility] ORS;  Service: Urology;  Laterality: Left;  . ESOPHAGEAL DILATION  06/00   EGD  . LAMINECTOMY  1976   Disc Removal X 2 Lumbar  . MASTECTOMY  03/1976   Bilateral due FCBD with implants [facility])  . MASTECTOMY Left    due to fibrocystic breast disease  . OOPHORECTOMY    . TISSUE EXPANDER PLACEMENT Right 02/03/2013   Procedure: REMOVAL OF RIGHT BREAST IMPLANT AND IMPLANT MATERIAL  CAPSULECTOMY;  Surgeon: Irene Limbo, MD;  Location: [facility];  Service: Plastics;  Laterality: Right;  . TOTAL ABDOMINAL HYSTERECTOMY W/ BILATERAL SALPINGOOPHORECTOMY      SOCIAL HISTORY:  Social History   Tobacco Use  . Smoking status: Former Smoker    Packs/day: 1.50    Years: 53.00    Pack years: 79.50    Types: Cigarettes    Last attempt to quit: 03/30/2008    Years since quitting: 9.3  . Smokeless tobacco: Never Used  . Tobacco comment: 1/4 PPD as of 2012  Substance Use Topics  . Alcohol use: No    Alcohol/week: 0.0 oz    FAMILY HISTORY:  Family History  Problem Relation Age of Onset  . Stroke Mother   . Heart disease Mother        MI  . Heart failure Mother   . Cancer Father        Lung  . Lung cancer Father   . COPD Brother   . Lung cancer Brother   . Heart disease Brother        CAD  . Heart disease Sister        cirrhosis due heart disease  . Cirrhosis Sister   . Coronary artery disease Sister   . Colon cancer Neg Hx   . Breast cancer Neg Hx     DRUG ALLERGIES:  Allergies  Allergen Reactions  . Amoxicillin Shortness Of Breath    Has patient  had a PCN reaction causing immediate rash, facial/tongue/throat swelling, SOB or lightheadedness with hypotension: Yes Has patient had a PCN reaction causing severe rash involving mucus membranes or skin necrosis: No Has patient had a PCN reaction that required hospitalization: No Has patient had a PCN reaction occurring within the last 10 years: No If all of the above answers are "  NO", then may proceed with Cephalosporin use.   Marland Kitchen Doxycycline Nausea Only and Other (See Comments)    Dizziness   . Ibuprofen Nausea And Vomiting  . Sertraline Hcl Nausea And Vomiting    REACTION: trembling    REVIEW OF SYSTEMS:   Not able to obtain due to patient's confusion.  MEDICATIONS AT HOME:  Prior to Admission medications   Medication Sig Start Date End Date Taking? Authorizing Provider  ALPRAZolam Duanne Moron) 0.5 MG tablet TAKE 1 TABLET BY MOUTH 3 TIMES A DAY AS NEEDED 05/27/17  Yes Tonia Ghent, MD  aspirin EC 81 MG tablet Take 81 mg by mouth daily.    Yes [provider]  atorvastatin (LIPITOR) 20 MG tablet Take 20 mg by mouth daily.   Yes [provider]  azithromycin (ZITHROMAX Z-PAK) 250 MG tablet Take 2 tablets (500 mg) on  Day 1,  followed by 1 tablet (250 mg) once daily on Days 2 through 5. 07/18/17  Yes Carrie Mew, MD  budesonide-formoterol [facility]) 160-4.5 MCG/ACT inhaler Inhale 2 puffs into the lungs 2 (two) times daily. 06/18/14  Yes Tonia Ghent, MD  clopidogrel (PLAVIX) 75 MG tablet TAKE 1 TABLET BY MOUTH ONCE A DAY 04/02/17  Yes Tonia Ghent, MD  diltiazem (CARDIZEM) 60 MG tablet Take 1 tablet (60 mg total) by mouth 3 (three) times daily. 06/18/17  Yes Tonia Ghent, MD  furosemide (LASIX) 40 MG tablet Take 1 tablet (40 mg total) by mouth daily. 10/05/16  Yes Epifanio Lesches, MD  metoprolol succinate (TOPROL-XL) 50 MG 24 hr tablet Take 1 tablet (50 mg total) by mouth daily. 06/18/17  Yes Tonia Ghent, MD  OXYGEN Inhale 2-3 L into the lungs  continuous.   Yes [provider]  potassium chloride (K-DUR) 10 MEQ tablet Take 1 tablet (10 mEq total) by mouth 2 (two) times daily. 07/18/17  Yes Arta Silence, MD  predniSONE (DELTASONE) 20 MG tablet Take 3 tablets (60 mg total) by mouth daily for 4 days. 07/19/17 07/23/17 Yes Arta Silence, MD  PROAIR HFA 108 [phone] Base) MCG/ACT inhaler INHALE 2 PUFFS BY MOUTH EVERY 6 HOURS ASNEEDED 07/08/17  Yes Tonia Ghent, MD  ranolazine (RANEXA) 500 MG 12 hr tablet Take 1 tablet (500 mg total) by mouth 2 (two) times daily. 10/05/16  Yes Epifanio Lesches, MD  tiotropium (SPIRIVA HANDIHALER) 18 MCG inhalation capsule Inhale 68mcg once daily 07/04/14  Yes [provider]  venlafaxine (EFFEXOR) 37.5 MG tablet Take 37.5 mg by mouth daily.   Yes [provider]  docusate sodium (COLACE) 100 MG capsule Take 1 capsule (100 mg total) by mouth 2 (two) times daily. Patient not taking: Reported on 07/18/2017 04/28/17   Hollice Espy, MD  ipratropium-albuterol (DUONEB) 0.5-2.5 (3) MG/3ML SOLN Take 3 mLs by nebulization every 4 (four) hours as needed (shortness of breath). 06/18/17   Tonia Ghent, MD  magnesium hydroxide (MILK OF MAGNESIA) 400 MG/5ML suspension Take 30 mLs by mouth daily. Patient not taking: Reported on 07/18/2017 10/05/16   Epifanio Lesches, MD  nitroGLYCERIN (NITROSTAT) 0.4 MG SL tablet Place 1 tablet (0.4 mg total) under the tongue every 5 (five) minutes as needed for chest pain (max 3 doses in 15 min). 12/29/13   Tonia Ghent, MD      PHYSICAL EXAMINATION:   VITAL SIGNS: Blood pressure (!) 141/61, pulse 94, temperature (!) 97.5 F (36.4 C), temperature source Axillary, resp. rate 17, height 5\' 7"  (1.702 m), weight 66.8 kg (  147 lb 4.3 oz), SpO2 95 %.  GENERAL:  77 y.o.-year-old patient lying in the bed, in mild respiratory distress.  Currently on BiPAP. EYES: Pupils equal, round, reactive to light and accommodation. No scleral icterus.  HEENT: Head  atraumatic, normocephalic. Oropharynx and nasopharynx clear.  NECK:  Supple, no jugular venous distention. No thyroid enlargement, no tenderness.  LUNGS: Reduced breath sounds and scattered wheezing noted bilaterally.  Minimal use of accessory muscles of respiration.  CARDIOVASCULAR: S1, S2 normal. No S3/S4.  ABDOMEN: Soft, nontender, nondistended. Bowel sounds present. No organomegaly or mass.  EXTREMITIES: No pedal edema, cyanosis, or clubbing.  NEUROLOGIC EXAM: Is limited due to patient's confusion.  Patient is moving all her extremities, no focal weakness appreciated. PSYCHIATRIC: The patient is lethargic disoriented.  SKIN: No obvious rash, lesion, or ulcer.   LABORATORY PANEL:   CBC Recent Labs  Lab 07/18/17 1412 07/21/17 1817 07/21/17 2357  WBC 7.7 8.8 7.1  HGB 12.4 12.6 12.7  HCT 36.0 38.2 38.0  PLT 242 273 228  MCV 86.9 89.3 86.8  MCH 29.8 29.4 29.0  MCHC 34.4 32.9 33.4  RDW 14.7* 14.9* 14.5   ------------------------------------------------------------------------------------------------------------------  Chemistries  Recent Labs  Lab 07/18/17 1412 07/21/17 1817 07/21/17 2357  NA 135 140 135  K 2.9* 3.3* 3.3*  CL 94* 96* 95*  CO2 34* 36* 33*  GLUCOSE 99 113* 149*  BUN 13 18 17   CREATININE 0.81 0.85 0.80  CALCIUM 9.4 9.3 9.2   ------------------------------------------------------------------------------------------------------------------ estimated creatinine clearance is 60 mL/min (by C-G formula based on SCr of 0.8 mg/dL). ------------------------------------------------------------------------------------------------------------------ No results for input(s): TSH, T4TOTAL, T3FREE, THYROIDAB in the last 72 hours.  Invalid input(s): FREET3   Coagulation profile No results for input(s): INR, PROTIME in the last 168 hours. ------------------------------------------------------------------------------------------------------------------- No results for  input(s): DDIMER in the last 72 hours. -------------------------------------------------------------------------------------------------------------------  Cardiac Enzymes Recent Labs  Lab 07/18/17 1412 07/21/17 2357  TROPONINI <0.03 <0.03   ------------------------------------------------------------------------------------------------------------------ Invalid input(s): POCBNP  ---------------------------------------------------------------------------------------------------------------  Urinalysis    Component Value Date/Time   COLORURINE AMBER (A) 07/21/2017 2138   APPEARANCEUR CLEAR (A) 07/21/2017 2138   APPEARANCEUR Clear 05/07/2017 0944   LABSPEC 1.017 07/21/2017 2138   LABSPEC 1.016 03/25/2014 1549   PHURINE 5.0 07/21/2017 2138   GLUCOSEU NEGATIVE 07/21/2017 2138   GLUCOSEU Negative 03/25/2014 1549   HGBUR NEGATIVE 07/21/2017 2138   BILIRUBINUR NEGATIVE 07/21/2017 2138   BILIRUBINUR Negative 05/07/2017 0944   BILIRUBINUR Negative 03/25/2014 1549   KETONESUR NEGATIVE 07/21/2017 2138   PROTEINUR NEGATIVE 07/21/2017 2138   UROBILINOGEN 4.0 02/12/2015 1512   UROBILINOGEN 0.2 06/02/2012 1841   NITRITE NEGATIVE 07/21/2017 2138   LEUKOCYTESUR NEGATIVE 07/21/2017 2138   LEUKOCYTESUR 2+ (A) 05/07/2017 0944   LEUKOCYTESUR 1+ 03/25/2014 1549     RADIOLOGY: Dg Chest 2 View  Result Date: 07/21/2017 CLINICAL DATA:  Shortness of Breath EXAM: CHEST - 2 VIEW COMPARISON:  07/18/2017 FINDINGS: Mild hyperinflation/COPD. Heart is upper limits normal in size. No confluent opacities or effusions. No acute bony abnormality. IMPRESSION: Hyperinflation/COPD.  No active disease. Electronically Signed   By: Rolm Baptise M.D.   On: 07/21/2017 19:03    EKG: Orders placed or performed during the hospital encounter of 07/21/17  . ED EKG  . ED EKG  . EKG 12-Lead  . EKG 12-Lead    IMPRESSION AND PLAN:  1.  Acute on chronic hypercapnic and hypoxemic respiratory failure, likely  secondary to acute COPD exacerbation, caused by acute bronchitis.  Continue BiPAP treatment.  Will  admit patient to intensive care unit for close monitoring.  We will start oxygen therapy, nebulizers, steroids IV and antibiotics IV. 2.  Acute encephalopathy, likely secondary to hypercapnia.  Continue treatment with BiPAP and monitor patient clinically closely. 3.  Acute COPD exacerbation, secondary to acute bronchitis.  Will start treatment with Levaquin, nebulizers, oxygen and steroids. 4.  Acute bronchitis, see treatment as above under #3. 5.  Hypokalemia, will replace potassium per protocol. 6.  Coronary artery disease, status post stent placement in the past, stable.  We will continue medical management.  Continue to monitor on telemetry.  All the records are reviewed and case discussed with ED provider. Management plans discussed with the patient, family and they are in agreement.  CODE STATUS:    Code Status Orders  (From admission, onward)        Start     Ordered   07/21/17 2348  Full code  Continuous     07/21/17 2347    Code Status History    Date Active Date Inactive Code Status Order ID Comments User Context   09/25/2016 1716 10/05/2016 1940 Full Code [id number]  Loletha Grayer, MD ED   05/15/2016 0051 05/15/2016 1958 Full Code [id number]  Harvie Bridge, DO Inpatient   05/14/2013 1207 05/19/2013 1737 Full Code [id number]  Domenic Polite, MD Inpatient   06/17/2012 2352 06/22/2012 1615 Full Code [id number]  Orvan Falconer, MD Inpatient       TOTAL TIME TAKING CARE OF THIS PATIENT: 50 minutes.    Amelia Jo M.D on 07/22/2017 at 1:41 AM  Between 7am to 6pm - Pager - [phone]  After 6pm go to [email or web address] - password [email or web address] [place] [facility]  Office  [phone]  CC: Primary care physician; Tonia Ghent, MD

## 2017-07-22 NOTE — Progress Notes (Signed)
PHARMACIST - PHYSICIAN COMMUNICATION CONCERNING: Antibiotic IV to Oral Route Change Policy  RECOMMENDATION: This patient is receiving levofloxacin by the intravenous route.  Based on criteria approved by the Pharmacy and Therapeutics Committee, the antibiotic(s) is/are being converted to the equivalent oral dose form(s).   DESCRIPTION: These criteria include:  Patient being treated for a respiratory tract infection, urinary tract infection, cellulitis or clostridium difficile associated diarrhea if on metronidazole  The patient is not neutropenic and does not exhibit a GI malabsorption state  The patient is eating (either orally or via tube) and/or has been taking other orally administered medications for a least 24 hours  The patient is improving clinically and has a Tmax < 100.5  If you have questions about this conversion, please contact the Pharmacy Department  []  ( [phone] )   [x]  ( [phone] )  [facility] []  ( [phone] )  [facility] []  ( [phone] )  [facility] []  ( [phone] )  [facility]  

## 2017-07-22 NOTE — Progress Notes (Signed)
On rounds this AM, she was much improved and comfortable on [place] O2. There was no wheezing on exam. I placed order for her transfer to med-surg floor. She is followed as an outpt by Dr Raul Del  Would complete treatment for COPD exacerbation. If further pulmonary assistance is needed, please notify Dr Raul Del  PCCM will sign off. Please call if we can be of further assistance    Merton Border, MD PCCM service Mobile [phone] Pager [phone] 07/22/2017 2:15 PM

## 2017-07-22 NOTE — Progress Notes (Signed)
[place] [facility] [place] NAME: Mckenzie Mclaughlin    MR#:  [id number]  DATE OF BIRTH:  Aug 25, 1940  SUBJECTIVE:  CHIEF COMPLAINT:   Chief Complaint  Patient presents with  . Shortness of Breath   Both her shortness breath and the cough, off BiPAP, oxygen 3 L. REVIEW OF SYSTEMS:  Review of Systems  Constitutional: Positive for malaise/fatigue. Negative for chills and fever.  HENT: Negative for sore throat.   Eyes: Negative for blurred vision and double vision.  Respiratory: Positive for cough, shortness of breath and wheezing. Negative for hemoptysis and stridor.   Cardiovascular: Negative for chest pain, palpitations, orthopnea and leg swelling.  Gastrointestinal: Negative for abdominal pain, blood in stool, diarrhea, melena, nausea and vomiting.  Genitourinary: Negative for dysuria, flank pain and hematuria.  Musculoskeletal: Negative for back pain and joint pain.  Skin: Negative for rash.  Neurological: Negative for dizziness, sensory change, focal weakness, seizures, loss of consciousness, weakness and headaches.  Endo/Heme/Allergies: Negative for polydipsia.  Psychiatric/Behavioral: Negative for depression. The patient is not nervous/anxious.     DRUG ALLERGIES:   Allergies  Allergen Reactions  . Amoxicillin Shortness Of Breath    Has patient had a PCN reaction causing immediate rash, facial/tongue/throat swelling, SOB or lightheadedness with hypotension: Yes Has patient had a PCN reaction causing severe rash involving mucus membranes or skin necrosis: No Has patient had a PCN reaction that required hospitalization: No Has patient had a PCN reaction occurring within the last 10 years: No If all of the above answers are "NO", then may proceed with Cephalosporin use.   Marland Kitchen Doxycycline Nausea Only and Other (See Comments)    Dizziness   . Ibuprofen Nausea And Vomiting  . Sertraline Hcl Nausea And Vomiting    REACTION: trembling    VITALS:  Blood pressure (!) 148/57, pulse 84, temperature 98.4 F (36.9 C), temperature source Oral, resp. rate 18, height 5\' 7"  (1.702 m), weight 147 lb 4.3 oz (66.8 kg), SpO2 94 %. PHYSICAL EXAMINATION:  Physical Exam  Constitutional: She is oriented to person, place, and time.  HENT:  Head: Normocephalic.  Mouth/Throat: Oropharynx is clear and moist.  Eyes: Pupils are equal, round, and reactive to light. Conjunctivae and EOM are normal. No scleral icterus.  Neck: Normal range of motion. Neck supple. No JVD present. No tracheal deviation present.  Cardiovascular: Normal rate, regular rhythm and normal heart sounds. Exam reveals no gallop.  No murmur heard. Pulmonary/Chest: Effort normal. No respiratory distress. She has decreased breath sounds. She has no wheezes. She has no rales.  Abdominal: Soft. Bowel sounds are normal. She exhibits no distension. There is no tenderness. There is no rebound.  Musculoskeletal: Normal range of motion. She exhibits no edema or tenderness.  Neurological: She is alert and oriented to person, place, and time. No cranial nerve deficit.  Skin: No rash noted. No erythema.  bruises   LABORATORY PANEL:  Female CBC Recent Labs  Lab 07/21/17 2357  WBC 7.1  HGB 12.7  HCT 38.0  PLT 228   ------------------------------------------------------------------------------------------------------------------ Chemistries  Recent Labs  Lab 07/22/17 0538  NA 137  K 3.7  CL 98*  CO2 34*  GLUCOSE 147*  BUN 15  CREATININE 0.69  CALCIUM 9.0  MG 1.6*   RADIOLOGY:  Dg Chest 2 View  Result Date: 07/21/2017 CLINICAL DATA:  Shortness of Breath EXAM: CHEST - 2 VIEW COMPARISON:  07/18/2017 FINDINGS: Mild hyperinflation/COPD. Heart is upper limits normal in size.  No confluent opacities or effusions. No acute bony abnormality. IMPRESSION: Hyperinflation/COPD.  No active disease. Electronically Signed   By: Rolm Baptise M.D.   On: 07/21/2017 19:03   ASSESSMENT AND  PLAN:   1.  Acute on chronic hypercapnic and hypoxemic respiratory failure, secondary to acute COPD exacerbation, caused by acute bronchitis.  Off BiPAP, on O2 [place] 3 L, continue nebulizers, steroids IV and antibiotics IV.  2.  Acute encephalopathy, likely secondary to hypercapnia.   Improved with treatment as above.    3.  Acute COPD exacerbation, secondary to acute bronchitis.   Continue Levaquin, nebulizers, oxygen and steroids.  4.  Acute bronchitis, see treatment as above under #3. 5.  Hypokalemia,  improved with potassium supplement. 6.  Coronary artery disease, status post stent placement in the past, stable.  continue medical management.  Hypomagnesemia.  Give IV magnesium in the follow-up level. Generalized weakness.  PT evaluation. All the records are reviewed and case discussed with Care Management/Social Worker. Management plans discussed with the patient, her sisters and they are in agreement.  CODE STATUS: Full Code  TOTAL TIME TAKING CARE OF THIS PATIENT: 36 minutes.   More than 50% of the time was spent in counseling/coordination of care: YES  POSSIBLE D/C IN 2 DAYS, DEPENDING ON CLINICAL CONDITION.   Demetrios Loll M.D on 07/22/2017 at 2:36 PM  Between 7am to 6pm - Pager - [phone]  After 6pm go to [email or web address] - [place] [facility]

## 2017-07-23 LAB — MAGNESIUM: Magnesium: 2.3 mg/dL (ref 1.7–2.4)

## 2017-07-23 LAB — GLUCOSE, CAPILLARY: Glucose-Capillary: 100 mg/dL — ABNORMAL HIGH (ref 65–99)

## 2017-07-23 NOTE — Evaluation (Signed)
Physical Therapy Evaluation Patient Details Name: Mckenzie Mclaughlin MRN: [id number] DOB: 1940-08-25 Today's Date: 07/23/2017   History of Present Illness  Pt admitted for ARF with acute bronchitis. Complains of SOB symptoms and cough. History includes COPD on 2L of O2 at home, CAD, HTN, and anxiety. Of note, recently came to ED with similar symptoms. Upon admission, sent to CCU for bipap, now transferred to floor care on 3L of O2. Pt reports she has had 4 falls since Jan.  Clinical Impression  Pt is a pleasant 77 year old female who was admitted for ARF with acute bronchitis. Pt performs bed mobility with cga, transfers with min assist, and ambulation with min assist and RW. Pt demonstrates deficits with strength/mobility/endurance. All mobility performed on 2L of O2 initially and increased to 3L of O2 for further mobility. Increased fatigue noted with short distance, didn't feel she was able to ambulate more this session. Pt is currently not at baseline level. High risk for falls, recommending using RW for all mobility. Would benefit from skilled PT to address above deficits and promote optimal return to PLOF; recommend transition to STR upon discharge from acute hospitalization.       Follow Up Recommendations SNF    Equipment Recommendations  None recommended by PT    Recommendations for Other Services OT consult     Precautions / Restrictions Precautions Precautions: Fall Restrictions Weight Bearing Restrictions: No      Mobility  Bed Mobility Overal bed mobility: Needs Assistance Bed Mobility: Supine to Sit     Supine to sit: Min guard     General bed mobility comments: needs cues for sequencing. Once seated at EOB, increased work of breathing noted.  Transfers Overall transfer level: Needs assistance Equipment used: Rolling walker (2 wheeled) Transfers: Sit to/from Stand Sit to Stand: Min assist         General transfer comment: needs slight assist for lift off.  Once standing, forward flexed posture noted. RW used due to weakness. All mobility on 2L of O2 (baseline), however sats decreased to 85%. 2nd transfer performed on 3L of O2 with sats at 97%.  Ambulation/Gait Ambulation/Gait assistance: Min assist Ambulation Distance (Feet): 15 Feet Assistive device: Rolling walker (2 wheeled) Gait Pattern/deviations: Step-through pattern     General Gait Details: ambulated with slow speed and short steps around room. Needed sitting rest break and reported on RPE at 9/10 for exertion. Able to performed pursed lip breathing, O2 sats WNL on 3L of O2. Reports inability to ambulate further at this time due to SOB symptoms  Stairs            Wheelchair Mobility    Modified Rankin (Stroke Patients Only)       Balance Overall balance assessment: Needs assistance;History of Falls Sitting-balance support: Feet supported Sitting balance-Leahy Scale: Good     Standing balance support: Bilateral upper extremity supported Standing balance-Leahy Scale: Good                               Pertinent Vitals/Pain Pain Assessment: No/denies pain    Home Living Family/patient expects to be discharged to:: Private residence Living Arrangements: Spouse/significant other Available Help at Discharge: Family(reports spouse is limited in assistance due to health issue) Type of Home: House Home Access: Stairs to enter Entrance Stairs-Rails: Can reach both Entrance Stairs-Number of Steps: 5 Home Layout: One level Home Equipment: Walker - 2 wheels;Bedside commode  Prior Function Level of Independence: Independent         Comments: ambulates most of the time without AD, however does have RW to use if necessary, reports she is only able to ambulate very short distances due to fatigue     Hand Dominance        Extremity/Trunk Assessment   Upper Extremity Assessment Upper Extremity Assessment: Generalized weakness(B UE grossly 4/5)     Lower Extremity Assessment Lower Extremity Assessment: Generalized weakness(B LE grossly 4-/5)       Communication   Communication: No difficulties  Cognition Arousal/Alertness: Awake/alert Behavior During Therapy: WFL for tasks assessed/performed Overall Cognitive Status: Within Functional Limits for tasks assessed                                        General Comments      Exercises Other Exercises Other Exercises: ambulated to [facility] with min assist for lines/leads. No AD used and pt on 2L of O2. Increased SOB symptoms noted, needs supervision for hygiene and set up. Limited exertion noted. Provided AD and increased O2 for further ambulation in room.   Assessment/Plan    PT Assessment Patient needs continued PT services  PT Problem List Decreased strength;Cardiopulmonary status limiting activity;Decreased activity tolerance;Decreased balance;Decreased mobility       PT Treatment Interventions Gait training;Therapeutic exercise;Balance training;DME instruction    PT Goals (Current goals can be found in the Care Plan section)  Acute Rehab PT Goals Patient Stated Goal: to get stronger prior to going home PT Goal Formulation: With patient Time For Goal Achievement: 08/06/17 Potential to Achieve Goals: Good    Frequency Min 2X/week   Barriers to discharge        Co-evaluation               AM-PAC PT "6 Clicks" Daily Activity  Outcome Measure Difficulty turning over in bed (including adjusting bedclothes, sheets and blankets)?: Unable Difficulty moving from lying on back to sitting on the side of the bed? : Unable Difficulty sitting down on and standing up from a chair with arms (e.g., wheelchair, bedside commode, etc,.)?: Unable Help needed moving to and from a bed to chair (including a wheelchair)?: A Little Help needed walking in hospital room?: A Lot Help needed climbing 3-5 steps with a railing? : A Lot 6 Click Score: 10    End of  Session Equipment Utilized During Treatment: Gait belt;Oxygen Activity Tolerance: Patient limited by fatigue Patient left: in chair;with chair alarm set;with family/visitor present Nurse Communication: Mobility status PT Visit Diagnosis: Unsteadiness on feet (R26.81);Muscle weakness (generalized) (M62.81);Difficulty in walking, not elsewhere classified (R26.2)    Time: 6546-5035 PT Time Calculation (min) (ACUTE ONLY): 33 min   Charges:   PT Evaluation $PT Eval Moderate Complexity: 1 Mod PT Treatments $Therapeutic Activity: 8-22 mins   PT G CodesGreggory Stallion, PT, DPT [phone]   Griffin Dewilde 07/23/2017, 4:59 PM

## 2017-07-23 NOTE — Progress Notes (Signed)
Advanced Care Plan.  Purpose of Encounter: CODE STATUS Parties in Attendance: The patient, her husband and me. Patient's Decisional Capacity: Yes Medical Story: Mckenzie Mclaughlin  is a 77 y.o. female with a known history of CAD, COPD, chronic respiratory failure on continuous 2 L oxygen at home, skin cancer, arthritis, hypertension and hyperlipidemia.  The patient was remediated to ICU due to acute on chronic respiratory failure secondary to COPD exacerbation.  She was on BiPAP and now is on oxygen 3 L.  I discussed with the patient and her husband about her current condition, prognosis and CODE STATUS.  She hesitates to decide and wants full code for now. Plan:  Code Status: Full code. Time spent discussing advance care planning: 17 minutes.

## 2017-07-23 NOTE — Progress Notes (Addendum)
[facility] NAME: Ginnifer Creelman    MR#:  [id number]  DATE OF BIRTH:  [birth year]  SUBJECTIVE:  CHIEF COMPLAINT:   Chief Complaint  Patient presents with  . Shortness of Breath   Still shortness of breath and the cough, oxygen 3 L. REVIEW OF SYSTEMS:  Review of Systems  Constitutional: Positive for malaise/fatigue. Negative for chills and fever.  HENT: Negative for sore throat.   Eyes: Negative for blurred vision and double vision.  Respiratory: Positive for cough, shortness of breath and wheezing. Negative for hemoptysis and stridor.   Cardiovascular: Negative for chest pain, palpitations, orthopnea and leg swelling.  Gastrointestinal: Negative for abdominal pain, blood in stool, diarrhea, melena, nausea and vomiting.  Genitourinary: Negative for dysuria, flank pain and hematuria.  Musculoskeletal: Negative for back pain and joint pain.  Skin: Negative for rash.  Neurological: Negative for dizziness, sensory change, focal weakness, seizures, loss of consciousness, weakness and headaches.  Endo/Heme/Allergies: Negative for polydipsia.  Psychiatric/Behavioral: Negative for depression. The patient is not nervous/anxious.     DRUG ALLERGIES:   Allergies  Allergen Reactions  . Amoxicillin Shortness Of Breath    Has patient had a PCN reaction causing immediate rash, facial/tongue/throat swelling, SOB or lightheadedness with hypotension: Yes Has patient had a PCN reaction causing severe rash involving mucus membranes or skin necrosis: No Has patient had a PCN reaction that required hospitalization: No Has patient had a PCN reaction occurring within the last 10 years: No If all of the above answers are "NO", then may proceed with Cephalosporin use.   Marland Kitchen Doxycycline Nausea Only and Other (See Comments)    Dizziness   . Ibuprofen Nausea And Vomiting  . Sertraline Hcl Nausea And Vomiting    REACTION: trembling   VITALS:  Blood  pressure (!) 160/76, pulse 90, temperature 97.7 F (36.5 C), temperature source Oral, resp. rate 18, height 5\' 7"  (1.702 m), weight 142 lb 8 oz (64.6 kg), SpO2 97 %. PHYSICAL EXAMINATION:  Physical Exam  Constitutional: She is oriented to person, place, and time. She appears well-developed. No distress.  HENT:  Head: Normocephalic.  Mouth/Throat: Oropharynx is clear and moist.  Eyes: Pupils are equal, round, and reactive to light. Conjunctivae and EOM are normal. No scleral icterus.  Neck: Normal range of motion. Neck supple. No JVD present. No tracheal deviation present.  Cardiovascular: Normal rate, regular rhythm and normal heart sounds. Exam reveals no gallop.  No murmur heard. Pulmonary/Chest: Effort normal. No respiratory distress. She has decreased breath sounds. She has wheezes. She has no rales.  Abdominal: Soft. Bowel sounds are normal. She exhibits no distension. There is no tenderness. There is no rebound.  Musculoskeletal: Normal range of motion. She exhibits no edema or tenderness.  Neurological: She is alert and oriented to person, place, and time. No cranial nerve deficit.  Skin: No rash noted. No erythema.  bruises   LABORATORY PANEL:  Female CBC Recent Labs  Lab 07/21/17 2357  WBC 7.1  HGB 12.7  HCT 38.0  PLT 228   ------------------------------------------------------------------------------------------------------------------ Chemistries  Recent Labs  Lab 07/22/17 0538 07/23/17 0409  NA 137  --   K 3.7  --   CL 98*  --   CO2 34*  --   GLUCOSE 147*  --   BUN 15  --   CREATININE 0.69  --   CALCIUM 9.0  --   MG 1.6* 2.3   RADIOLOGY:  No results found.  ASSESSMENT AND PLAN:   1.  Acute on chronic hypercapnic and hypoxemic respiratory failure, secondary to acute COPD exacerbation, caused by acute bronchitis.  Off BiPAP, on O2 [place] 3 L, continue nebulizers, steroids IV and antibiotics IV.  Taper steroids.  2.  Acute encephalopathy, likely secondary to  hypercapnia.   Improved with treatment as above.    3.  Acute COPD exacerbation, secondary to acute bronchitis.   Continue Levaquin, nebulizers, oxygen and steroids.  4.  Acute bronchitis, see treatment as above under #3. 5.  Hypokalemia,  improved with potassium supplement. 6.  Coronary artery disease, status post stent placement in the past, stable.  continue medical management.  Hypomagnesemia.  Given IV magnesium, improved. Generalized weakness.  PT evaluation. All the records are reviewed and case discussed with Care Management/Social Worker. Management plans discussed with the patient, her husband and they are in agreement.  CODE STATUS: Full Code  TOTAL TIME TAKING CARE OF THIS PATIENT: 32 minutes.   More than 50% of the time was spent in counseling/coordination of care: YES  POSSIBLE D/C IN 2 DAYS, DEPENDING ON CLINICAL CONDITION.   Demetrios Loll M.D on 07/23/2017 at 2:55 PM  Between 7am to 6pm - Pager - [phone]  After 6pm go to [email or web address] - [facility]

## 2017-07-24 MED ORDER — PREDNISONE 50 MG PO TABS: ORAL_TABLET | ORAL | 0 refills | Status: DC

## 2017-07-24 MED ORDER — LEVOFLOXACIN 500 MG PO TABS: 500.0000 mg | ORAL_TABLET | Freq: Every day | ORAL | 0 refills | Status: DC

## 2017-07-24 MED ORDER — ALPRAZOLAM 0.25 MG PO TABS
0.2500 mg | ORAL_TABLET | Freq: Four times a day (QID) | ORAL | Status: DC
  Administered 2017-07-24 – 2017-07-25 (×4): 0.25 mg via ORAL
  Filled 2017-07-24 (×3): qty 1

## 2017-07-24 NOTE — Evaluation (Signed)
Occupational Therapy Evaluation Patient Details Name: Mckenzie Mclaughlin MRN: [id number] DOB: 1940-09-04 Today's Date: 07/24/2017    History of Present Illness Pt admitted for ARF with acute bronchitis. Complains of SOB symptoms and cough. History includes COPD on 2L of O2 at home, CAD, HTN, and anxiety. Of note, recently came to ED with similar symptoms. Upon admission, sent to CCU for bipap, now transferred to floor care on 3L of O2. Pt reports she has had 4 falls since Jan.   Clinical Impression   Patient is a 77 yo female who lives with her husband in a one story home.  She was admitted to the hospital for ARF and acute bronchitis with SOB and history of COPD.  She demonstrates muscle weakness, shortness of breath with activity, decreased endurance and decreased ability to perform transfers, functional mobility and self care tasks.  She would benefit from skilled OT to maximize her safety and independence in daily tasks including energy conservation techniques.  Recommend STR prior to returning home to further work towards greater independence.     Follow Up Recommendations  SNF    Equipment Recommendations       Recommendations for Other Services       Precautions / Restrictions Precautions Precautions: Fall Restrictions Weight Bearing Restrictions: No      Mobility Bed Mobility Overal bed mobility: Needs Assistance Bed Mobility: Supine to Sit     Supine to sit: Min guard     General bed mobility comments: cues for transfer technique, shortness of breath noted.  Transfers Overall transfer level: Needs assistance Equipment used: Rolling walker (2 wheeled) Transfers: Sit to/from Stand Sit to Stand: Min assist         General transfer comment: increased SOB with BSC transfer, O2 in place    Balance Overall balance assessment: Needs assistance;History of Falls Sitting-balance support: Feet supported Sitting balance-Leahy Scale: Good     Standing balance support:  Bilateral upper extremity supported Standing balance-Leahy Scale: Good                             ADL either performed or assessed with clinical judgement   ADL Overall ADL's : Needs assistance/impaired Eating/Feeding: Independent   Grooming: Standing;Min guard   Upper Body Bathing: Modified independent   Lower Body Bathing: Minimal assistance   Upper Body Dressing : Modified independent   Lower Body Dressing: Minimal assistance   Toilet Transfer: Min guard   Toileting- Clothing Manipulation and Hygiene: Min guard               Vision Baseline Vision/History: Wears glasses Wears Glasses: Reading only Patient Visual Report: No change from baseline       Perception     Praxis      Pertinent Vitals/Pain Pain Assessment: No/denies pain     Hand Dominance Right   Extremity/Trunk Assessment Upper Extremity Assessment Upper Extremity Assessment: Generalized weakness   Lower Extremity Assessment Lower Extremity Assessment: Defer to PT evaluation       Communication Communication Communication: No difficulties   Cognition Arousal/Alertness: Awake/alert Behavior During Therapy: WFL for tasks assessed/performed Overall Cognitive Status: Within Functional Limits for tasks assessed                                     General Comments       Exercises  Shoulder Instructions      Home Living Family/patient expects to be discharged to:: Private residence Living Arrangements: Spouse/significant other Available Help at Discharge: Family Type of Home: House Home Access: Stairs to enter [employer] of Steps: patietn reports 2 steps to enter Entrance Stairs-Rails: Can reach both Home Layout: One level     Bathroom Shower/Tub: Teacher, early years/pre: Handicapped height     Home Equipment: Environmental consultant - 2 wheels;Bedside commode          Prior Functioning/Environment Level of Independence: Needs  assistance    ADL's / Homemaking Assistance Needed: Patient reports she has an aide who comes in 2 times a week to assist with bathing, otherwise she does a sponge bath.  She is unable to perform shower transfers alone.  She occasionally requires assist from husband to put on her socks and shoes.  Husband performs most of the homemaking tasks, driving and shopping.  They have a housekeeper to come in 2 times a month to assist with heavier tasks.    Comments: ambulates most of the time without AD, however does have RW to use if necessary, reports she is only able to ambulate very short distances due to fatigue        OT Problem List: Decreased strength;Impaired balance (sitting and/or standing);Decreased activity tolerance;Decreased knowledge of use of DME or AE      OT Treatment/Interventions: Self-care/ADL training;DME and/or AE instruction;Therapeutic activities;Balance training;Therapeutic exercise;Energy conservation;Patient/family education    OT Goals(Current goals can be found in the care plan section) Acute Rehab OT Goals Patient Stated Goal: Patient would like to be as independent as possible OT Goal Formulation: With patient Time For Goal Achievement: 07/31/17 Potential to Achieve Goals: Good  OT Frequency: Min 1X/week   Barriers to D/C:            Co-evaluation              AM-PAC PT "6 Clicks" Daily Activity     Outcome Measure Help from another person eating meals?: None Help from another person taking care of personal grooming?: A Little Help from another person toileting, which includes using toliet, bedpan, or urinal?: A Lot Help from another person bathing (including washing, rinsing, drying)?: A Little Help from another person to put on and taking off regular upper body clothing?: None Help from another person to put on and taking off regular lower body clothing?: A Lot 6 Click Score: 18   End of Session Equipment Utilized During Treatment: Gait  belt  Activity Tolerance: Patient tolerated treatment well Patient left: in bed  OT Visit Diagnosis: Muscle weakness (generalized) (M62.81);Unsteadiness on feet (R26.81)                Time: 8676-7209 OT Time Calculation (min): 32 min Charges:  OT General Charges $OT Visit: 1 Visit OT Evaluation $OT Eval Low Complexity: 1 Low OT Treatments $Self Care/Home Management : 8-22 mins G-Codes: OT G-codes **NOT FOR INPATIENT CLASS** Functional Assessment Tool Used: AM-PAC 6 Clicks Daily Activity   Darek Eifler T Leira Regino, OTR/L, CLT   Demarie Uhlig 07/24/2017, 10:16 AM

## 2017-07-24 NOTE — Care Management (Signed)
Spoke with patient. She states she cant go home. She cant care for her self. Wants to go to [facility]. CSW updated

## 2017-07-24 NOTE — NC FL2 (Signed)
[place] LEVEL OF CARE SCREENING TOOL     IDENTIFICATION  Patient Name: Mckenzie Mclaughlin Birthdate: 12/02/1940 Sex: female Admission Date (Current Location): 07/21/2017  [place] and [place] Number:  Engineering geologist and Address:  [facility], [address] [place], [place] [address]      Provider Number: [id number]  Attending Physician Name and Address:  Gorden Harms, MD  Relative Name and Phone Number:  Gracilyn Gunia (spouse) [phone]    Current Level of Care: Hospital Recommended Level of Care: [place] Prior Approval Number:    Date Approved/Denied:   PASRR Number: [id number]  Discharge Plan: SNF    Current Diagnoses: Patient Active Problem List   Diagnosis Date Noted  . Acute respiratory failure ([place]) 07/21/2017  . Renal stone 06/20/2017  . Fall at home 04/05/2017  . Unstable angina ([place]) 09/25/2016  . Dizziness 05/14/2016  . Atrophic vaginitis 02/28/2015  . Gross hematuria 02/26/2015  . Radicular pain in right arm 11/21/2014  . Pupil asymmetry 11/16/2014  . Osteoporosis 11/16/2014  . Senile purpura ([place]) 11/16/2014  . Right hand pain 11/16/2014  . Advance care planning 08/15/2014  . Other specified counseling 08/15/2014  . Constipation 06/21/2014  . NSTEMI (non-ST elevated myocardial infarction) ([place]) 12/17/2013  . Acute subendocardial infarction ([place]) 12/17/2013  . History of cardiac catheterization 09/15/2013  . Chest pain on exertion 08/25/2013  . Medicare annual wellness visit, subsequent 07/09/2013  . Beat, premature ventricular 06/26/2013  . Awareness of heartbeats 06/26/2013  . Arteriosclerosis of coronary artery 01/05/2013  . Pulmonary emphysema ([place]) 01/05/2013  . Acid reflux 01/05/2013  . H/O pneumothorax 01/05/2013  . Neck pain 08/31/2012  . Anxiety and depression 07/11/2012  . Dysthymia 07/11/2012  . Esophageal stricture   . Nutritional marasmus ([place]) 06/22/2012  .  Lung nodule 04/17/2012  . Edema of foot 04/01/2012  . Paresthesia 10/27/2011  . Atypical chest pain 05/13/2011  . HLD (hyperlipidemia) 02/26/2011  . History of mammogram 02/26/2011  . Vertigo 02/18/2011  . B12 deficiency 01/31/2009  . HEADACHE 10/22/2006  . PREMATURE VENTRICULAR CONTRACTIONS, FREQUENT 07/19/2006  . HYPERTENSION 03/30/2000  . Essential (primary) hypertension 03/30/2000  . CORONARY ARTERY DISEASE 01/22/1999  . Atherosclerosis of coronary artery 01/22/1999  . GERD 08/29/1998  . Chronic obstructive pulmonary disease ([place]) 07/29/1998    Orientation RESPIRATION BLADDER Height & Weight     Self, Time, Situation, Place  O2(3L) Continent Weight: 142 lb 11.2 oz (64.7 kg) Height:  5\' 7"  (170.2 cm)  BEHAVIORAL SYMPTOMS/MOOD NEUROLOGICAL BOWEL NUTRITION STATUS      Continent Diet(Heart healthy/carb modified)  AMBULATORY STATUS COMMUNICATION OF NEEDS Skin   Extensive Assist Verbally Bruising                       Personal Care Assistance Level of Assistance  Bathing, Feeding, Dressing Bathing Assistance: Limited assistance Feeding assistance: Independent Dressing Assistance: Limited assistance     Functional Limitations Info             SPECIAL CARE FACTORS FREQUENCY  PT (By licensed PT)     PT Frequency: 5X per week              Contractures Contractures Info: Not present    Additional Factors Info  Code Status, Allergies, Psychotropic Code Status Info: Full Allergies Info: Amoxicillin, Doxycycline, Ibuprofen, Sertraline Hcl Psychotropic Info: Xanax         Current Medications (07/24/2017):  This is the current  hospital active medication list Current Facility-Administered Medications  Medication Dose Route Frequency Provider Last Rate Last Dose  . acetaminophen (TYLENOL) tablet 650 mg  650 mg Oral Q6H PRN Amelia Jo, MD      . ALPRAZolam Duanne Moron) tablet 0.25 mg  0.25 mg Oral QID Salary, Montell D, MD   0.25 mg at 07/24/17 1156  . aspirin  EC tablet 81 mg  81 mg Oral Daily Amelia Jo, MD   81 mg at 07/24/17 0737  . atorvastatin (LIPITOR) tablet 20 mg  20 mg Oral Daily Amelia Jo, MD   20 mg at 07/23/17 1805  . bisacodyl (DULCOLAX) EC tablet 5 mg  5 mg Oral Daily PRN Amelia Jo, MD      . budesonide (PULMICORT) nebulizer solution 0.5 mg  0.5 mg Nebulization BID Wilhelmina Mcardle, MD   0.5 mg at 07/24/17 0735  . clopidogrel (PLAVIX) tablet 75 mg  75 mg Oral Daily Amelia Jo, MD   75 mg at 07/24/17 1062  . [START ON 07/30/2017] cyanocobalamin ((VITAMIN B-12)) injection 1,000 mcg  1,000 mcg Intramuscular Q30 days Amelia Jo, MD      . diltiazem (CARDIZEM) tablet 60 mg  60 mg Oral TID Amelia Jo, MD   60 mg at 07/24/17 6948  . docusate sodium (COLACE) capsule 100 mg  100 mg Oral BID Amelia Jo, MD   100 mg at 07/24/17 5462  . enoxaparin (LOVENOX) injection 40 mg  40 mg Subcutaneous Q24H Wilhelmina Mcardle, MD   40 mg at 07/24/17 7035  . furosemide (LASIX) tablet 40 mg  40 mg Oral Daily Amelia Jo, MD   40 mg at 07/24/17 0093  . HYDROcodone-acetaminophen (NORCO/VICODIN) 5-325 MG per tablet 1-2 tablet  1-2 tablet Oral Q4H PRN Amelia Jo, MD      . ipratropium-albuterol (DUONEB) 0.5-2.5 (3) MG/3ML nebulizer solution 3 mL  3 mL Nebulization Q6H Amelia Jo, MD   3 mL at 07/24/17 1347  . ipratropium-albuterol (DUONEB) 0.5-2.5 (3) MG/3ML nebulizer solution 3 mL  3 mL Nebulization Q4H PRN Amelia Jo, MD   3 mL at 07/24/17 1155  . levofloxacin (LEVAQUIN) tablet 500 mg  500 mg Oral Daily Demetrios Loll, MD   500 mg at 07/24/17 8182  . magnesium hydroxide (MILK OF MAGNESIA) suspension 30 mL  30 mL Oral Daily Amelia Jo, MD      . methylPREDNISolone sodium succinate (SOLU-MEDROL) 40 mg/mL injection 40 mg  40 mg Intravenous Q12H Awilda Bill, NP   40 mg at 07/24/17 0520  . metoprolol succinate (TOPROL-XL) 24 hr tablet 50 mg  50 mg Oral Daily Amelia Jo, MD   50 mg at 07/24/17 9937  . nitroGLYCERIN (NITROSTAT) SL tablet  0.4 mg  0.4 mg Sublingual Q5 min PRN Amelia Jo, MD      . ondansetron [facility]) tablet 4 mg  4 mg Oral Q6H PRN Amelia Jo, MD       Or  . ondansetron [facility]) injection 4 mg  4 mg Intravenous Q6H PRN Amelia Jo, MD      . potassium chloride (K-DUR) CR tablet 10 mEq  10 mEq Oral BID Amelia Jo, MD   10 mEq at 07/24/17 1696  . ranolazine (RANEXA) 12 hr tablet 500 mg  500 mg Oral BID Amelia Jo, MD   500 mg at 07/24/17 0845  . traZODone (DESYREL) tablet 25 mg  25 mg Oral QHS PRN Amelia Jo, MD      . venlafaxine [facility]) tablet 37.5 mg  37.5 mg Oral Daily Amelia Jo, MD   37.5 mg at 07/24/17 0845     Discharge Medications: Please see discharge summary for a list of discharge medications.  Relevant Imaging Results:  Relevant Lab Results:   Additional Information SS# [id number]  Zettie Pho, LCSW

## 2017-07-24 NOTE — Clinical Social Work Note (Signed)
Clinical Social Work Assessment  Patient Details  Name: Mckenzie Mclaughlin MRN: [id number] Date of Birth: [birth year]  Date of referral:  07/24/17               Reason for consult:  Facility Placement                Permission sought to share information with:  Chartered certified accountant granted to share information::  Yes, Verbal Permission Granted  Name::        Agency::  [facility] area SNFs  Relationship::     Contact Information:     Housing/Transportation Living arrangements for the past 2 months:  [place] of Information:  Patient, Medical Team Patient Interpreter Needed:  None Criminal Activity/Legal Involvement Pertinent to Current Situation/Hospitalization:  No - Comment as needed Significant Relationships:  Warehouse manager, Spouse Lives with:  Spouse Do you feel safe going back to the place where you live?  Yes Need for family participation in patient care:  No (Coment)  Care giving concerns:  PT recommendation for SNF   Social Worker assessment / plan:  The CSW met with the patient at bedside to discuss discharge planning. The patient voiced that she is anxious about returning home due to her weakness. The CSW explained the PT recommendation for SNF and her options for discharge. The patient stated that she would like to "go to [facility])". The CSW asked if she had any other preferences should [facility] not have availability, and the patient was adamant that she would only choose that location.  The CSW has begun the referral process for SNF and will provide bed offers as available. The patient will need prior authorization from [facility] once she has chosen an offer. The patient will most likely discharge Monday.  Employment status:  Retired Nurse, adult PT Recommendations:  [place] / Referral to community resources:  [place]  Patient/Family's  Response to care:  The patient thanked the CSW and voiced that she felt much more at ease.  Patient/Family's Understanding of and Emotional Response to Diagnosis, Current Treatment, and Prognosis:  The patient understands and agrees with the current discharge plan.  Emotional Assessment Appearance:  Appears older than stated age Attitude/Demeanor/Rapport:  Apprehensive Affect (typically observed):  Anxious Orientation:  Oriented to Self, Oriented to Place, Oriented to  Time, Oriented to Situation Alcohol / Substance use:  Never Used Psych involvement (Current and /or in the community):  No (Comment)  Discharge Needs  Concerns to be addressed:  Care Coordination, Discharge Planning Concerns Readmission within the last 30 days:  No Current discharge risk:  Chronically ill Barriers to Discharge:  Continued Medical Work up   [employer], LCSW 07/24/2017, 3:04 PM

## 2017-07-24 NOTE — Progress Notes (Signed)
Physical Therapy Treatment Patient Details Name: Mckenzie Mclaughlin MRN: [id number] DOB: [birth year] Today's Date: 07/24/2017    History of Present Illness Pt admitted for ARF with acute bronchitis. Complains of SOB symptoms and cough. History includes COPD on 2L of O2 at home, CAD, HTN, and anxiety. Of note, recently came to ED with similar symptoms. Upon admission, sent to CCU for bipap, now transferred to floor care on 3L of O2. Pt reports she has had 4 falls since Jan.    PT Comments    Pt agreeable to PT for bed exercises only due to just returning to bed, fatigue and feeling short or breath with activity. Pt reports pain in abdomen 6/10 through faces scale.  Pt participates in supine exercises with assist needed, but rest breaks between each set of 10 due to mild to moderate dyspnea. Educated on pursed lip breathing and use of incentive spirometer; pt aware. Pt given written HEP. Continue PT to progress strength and endurance to improve all functional mobility.   Follow Up Recommendations  SNF     Equipment Recommendations       Recommendations for Other Services       Precautions / Restrictions Precautions Precautions: Fall Restrictions Weight Bearing Restrictions: No    Mobility  Bed Mobility               General bed mobility comments: Not tested; in bed  Transfers                    Ambulation/Gait                 Stairs             Wheelchair Mobility    Modified Rankin (Stroke Patients Only)       Balance                                            Cognition Arousal/Alertness: Awake/alert Behavior During Therapy: WFL for tasks assessed/performed Overall Cognitive Status: Within Functional Limits for tasks assessed                                        Exercises General Exercises - Lower Extremity Ankle Circles/Pumps: AROM;Both;10 reps;Supine Quad Sets: Strengthening;Both;10  reps;Supine Gluteal Sets: Strengthening;Both;10 reps;Supine Short Arc Quad: AROM;Both;10 reps;Supine Heel Slides: AROM;Both;10 reps;Supine Hip ABduction/ADduction: AROM;Both;10 reps;Supine Other Exercises Other Exercises: pursed lip breathing and use of incentive spirometer    General Comments        Pertinent Vitals/Pain Pain Assessment: Faces Faces Pain Scale: Hurts even more Pain Location: Abdomen Pain Intervention(s): Monitored during session;Limited activity within patient's tolerance    Home Living                      Prior Function            PT Goals (current goals can now be found in the care plan section) Progress towards PT goals: Progressing toward goals    Frequency    Min 2X/week      PT Plan Current plan remains appropriate    Co-evaluation              AM-PAC PT "6 Clicks" Daily Activity  Outcome Measure  Difficulty turning over in  bed (including adjusting bedclothes, sheets and blankets)?: Unable Difficulty moving from lying on back to sitting on the side of the bed? : Unable Difficulty sitting down on and standing up from a chair with arms (e.g., wheelchair, bedside commode, etc,.)?: Unable Help needed moving to and from a bed to chair (including a wheelchair)?: A Little Help needed walking in hospital room?: A Lot Help needed climbing 3-5 steps with a railing? : Total 6 Click Score: 9    End of Session Equipment Utilized During Treatment: Oxygen Activity Tolerance: Patient limited by fatigue;Other (comment)(shortness of breath) Patient left: in bed;with call bell/phone within reach;with bed alarm set   PT Visit Diagnosis: Unsteadiness on feet (R26.81);Muscle weakness (generalized) (M62.81);Difficulty in walking, not elsewhere classified (R26.2)     Time: 8329-1916 PT Time Calculation (min) (ACUTE ONLY): 17 min  Charges:  $Therapeutic Exercise: 8-22 mins                    G Codes:        Larae Grooms,  PTA 07/24/2017, 2:44 PM

## 2017-07-24 NOTE — Care Management (Signed)
Notified by [facility] that patient open with [facility] for PT, OT and HHA.

## 2017-07-24 NOTE — Discharge Summary (Addendum)
[place] [facility] [place] NAME: Mckenzie Mclaughlin    MR#:  [id number]  DATE OF BIRTH:  10-22-1940  DATE OF ADMISSION:  07/21/2017 ADMITTING PHYSICIAN: Amelia Jo, MD  DATE OF DISCHARGE: 07/26/2017  PRIMARY CARE PHYSICIAN: Tonia Ghent, MD    ADMISSION DIAGNOSIS:  COPD exacerbation ([place]) [J44.1] B12 deficiency [E53.8] Acute respiratory failure with hypoxia and hypercarbia ([place]) [J96.01, J96.02]  DISCHARGE DIAGNOSIS:  Active Problems:   Acute respiratory failure ([place])   SECONDARY DIAGNOSIS:   Past Medical History:  Diagnosis Date  . Anginal pain [facility])    see dr [name]  "Spams"  . Anxiety   . Arthritis   . Cancer (HCC)    side of head- skin cancer, squamous cell ca on left foot.  . Chronic airway obstruction (HCC)    chronic airway obstruction not elsewhere classified, unspecified  . Chronic obstructive asthma ([place])    unspecified  . Complication of anesthesia   . Constipation   . COPD (chronic obstructive pulmonary disease) ([place])   . COPD with emphysema ([place]) 01/05/2013  . COPD, severe 07/29/98  . Coronary artery disease 01/22/99   Non Q-wave MI, stent RCA  . Coronary atherosclerosis of native coronary artery 01/22/1999   status post stent RCA,   . Depression   . Dysrhythmia    Palpations at times. last time 10/29 ish 2014  . Esophageal dilatation    Pt needs appple sauce to take meds.  . Esophageal stricture    PT needs apple sauce to take meds  . GERD (gastroesophageal reflux disease) 08/29/98   severe-no meds now  . Head injury, acute, with loss of consciousness ([place]) 11/2012   unsure how long  . History of blood transfusion   . History of blurry vision 05/06-05/10/2009   Hospital [facility] CP R/O'D Blurry vision, smoking  . History of CT scan of head 08/02/09   w/o mild age appropr atrophy  . History of ETT 08/03/09   Myoview Normal  . History of ETT 05/1999   Cardiolite pos ETT, neg Cardiolite  . History  of ETT 09/02/01   Normal  . History of ETT 04/28/2006   Myoview normal EF 83%  . History of ETT 04/10/11   normal EF and no ischemia per Dr. Saralyn Pilar  . History of kidney stones 03/2017  . History of pneumothorax 01/05/2013  . Hx of cardiac catheterization 08/29/01   60% RCA 0/w 20-30% lesions  . Hx of cardiac catheterization 01/22/99   w/Stent 90% RCA lesion  . Hyperlipidemia, unspecified   . Hypertension    03/30/00  . Mental disorder   . On home oxygen therapy    as needed-has a travel tank  . Pneumothorax 2/14  . PONV (postoperative nausea and vomiting)    ad breast remocved and see was under anesthesia a long time  . Shortness of breath   . Status post aortic coarctation stent placement   . Tobacco abuse    1/4 pack daily    HOSPITAL COURSE:  1.Acute on chronic hypercapnic and hypoxemic respiratory failure, secondary to acute COPD exacerbation,caused by acute bronchitis. Resolved Successfully weaned off BiPAP, stable on 2 L via nasal cannula which is her baseline, provided breathing treatments as needed, empiric antibiotics/Levaquin while in house, IV Solu-Medrol which was tapered to prednisone, and patient did well   2.Acute encephalopathy Resolved likely secondary to hypercapnia.  3.Acute COPD exacerbation Resolved secondary to acute bronchitis. Continue Levaquin,nebulizers,oxygen and steroids.  4.Acute bronchitis Resolving with treatment as stated above  5.Hypokalemia Repleted  6.Coronary artery disease Stable on current regiment status post stent placement in the past  7. Hypomagnesemia Repleted DISCHARGE CONDITIONS:  On day of discharge patient is afebrile, hemodynamically stable, tolerating diet, follow-up with primary care provider in pulmonology as directed CONSULTS OBTAINED:    DRUG ALLERGIES:   Allergies  Allergen Reactions  . Amoxicillin Shortness Of Breath    Has patient had a PCN reaction causing immediate rash,  facial/tongue/throat swelling, SOB or lightheadedness with hypotension: Yes Has patient had a PCN reaction causing severe rash involving mucus membranes or skin necrosis: No Has patient had a PCN reaction that required hospitalization: No Has patient had a PCN reaction occurring within the last 10 years: No If all of the above answers are "NO", then may proceed with Cephalosporin use.   Marland Kitchen Doxycycline Nausea Only and Other (See Comments)    Dizziness   . Ibuprofen Nausea And Vomiting  . Sertraline Hcl Nausea And Vomiting    REACTION: trembling    DISCHARGE MEDICATIONS:   Allergies as of 07/26/2017      Reactions   Amoxicillin Shortness Of Breath   Has patient had a PCN reaction causing immediate rash, facial/tongue/throat swelling, SOB or lightheadedness with hypotension: Yes Has patient had a PCN reaction causing severe rash involving mucus membranes or skin necrosis: No Has patient had a PCN reaction that required hospitalization: No Has patient had a PCN reaction occurring within the last 10 years: No If all of the above answers are "NO", then may proceed with Cephalosporin use.   Doxycycline Nausea Only, Other (See Comments)   Dizziness    Ibuprofen Nausea And Vomiting   Sertraline Hcl Nausea And Vomiting   REACTION: trembling      Medication List    STOP taking these medications   azithromycin 250 MG tablet Commonly known as:  ZITHROMAX Z-PAK     TAKE these medications   ALPRAZolam 0.5 MG tablet Commonly known as:  XANAX TAKE 1 TABLET BY MOUTH 3 TIMES A DAY AS NEEDED   aspirin EC 81 MG tablet Take 81 mg by mouth daily.   atorvastatin 20 MG tablet Commonly known as:  LIPITOR Take 20 mg by mouth daily.   budesonide-formoterol 160-4.5 MCG/ACT inhaler Commonly known as:  SYMBICORT Inhale 2 puffs into the lungs 2 (two) times daily.   clopidogrel 75 MG tablet Commonly known as:  PLAVIX TAKE 1 TABLET BY MOUTH ONCE A DAY   diltiazem 60 MG tablet Commonly known as:   CARDIZEM Take 1 tablet (60 mg total) by mouth 3 (three) times daily.   docusate sodium 100 MG capsule Commonly known as:  COLACE Take 1 capsule (100 mg total) by mouth 2 (two) times daily.   furosemide 40 MG tablet Commonly known as:  LASIX Take 1 tablet (40 mg total) by mouth daily.   ipratropium-albuterol 0.5-2.5 (3) MG/3ML Soln Commonly known as:  DUONEB Take 3 mLs by nebulization every 4 (four) hours as needed (shortness of breath).   levofloxacin 500 MG tablet Commonly known as:  LEVAQUIN Take 1 tablet (500 mg total) by mouth daily.   magnesium hydroxide 400 MG/5ML suspension Commonly known as:  MILK OF MAGNESIA Take 30 mLs by mouth daily.   metoprolol succinate 50 MG 24 hr tablet Commonly known as:  TOPROL-XL Take 1 tablet (50 mg total) by mouth daily.   nitroGLYCERIN 0.4 MG SL tablet Commonly known as:  NITROSTAT Place 1 tablet (  0.4 mg total) under the tongue every 5 (five) minutes as needed for chest pain (max 3 doses in 15 min).   OXYGEN Inhale 2-3 L into the lungs continuous.   potassium chloride 10 MEQ tablet Commonly known as:  K-DUR Take 1 tablet (10 mEq total) by mouth 2 (two) times daily.   predniSONE 50 MG tablet Commonly known as:  DELTASONE 1 po daily What changed:    medication strength  how much to take  how to take this  when to take this  additional instructions   PROAIR HFA 108 (90 Base) MCG/ACT inhaler Generic drug:  albuterol INHALE 2 PUFFS BY MOUTH EVERY 6 HOURS ASNEEDED   ranolazine 500 MG 12 hr tablet Commonly known as:  RANEXA Take 1 tablet (500 mg total) by mouth 2 (two) times daily.   SPIRIVA HANDIHALER 18 MCG inhalation capsule Generic drug:  tiotropium Inhale 18mcg once daily   venlafaxine 37.5 MG tablet Commonly known as:  EFFEXOR Take 37.5 mg by mouth daily.        DISCHARGE INSTRUCTIONS:  If you experience worsening of your admission symptoms, develop shortness of breath, life threatening emergency,  suicidal or homicidal thoughts you must seek medical attention immediately by calling 911 or calling your MD immediately  if symptoms less severe.  You Must read complete instructions/literature along with all the possible adverse reactions/side effects for all the Medicines you take and that have been prescribed to you. Take any new Medicines after you have completely understood and accept all the possible adverse reactions/side effects.   Please note  You were cared for by a hospitalist during your hospital stay. If you have any questions about your discharge medications or the care you received while you were in the hospital after you are discharged, you can call the unit and asked to speak with the hospitalist on call if the hospitalist that took care of you is not available. Once you are discharged, your primary care physician will handle any further medical issues. Please note that NO REFILLS for any discharge medications will be authorized once you are discharged, as it is imperative that you return to your primary care physician (or establish a relationship with a primary care physician if you do not have one) for your aftercare needs so that they can reassess your need for medications and monitor your lab values.    Today   CHIEF COMPLAINT:   Chief Complaint  Patient presents with  . Shortness of Breath    HISTORY OF PRESENT ILLNESS:  77 y.o. female with a known history of CAD, COPD on continuous 2 L oxygen, at home.  Patient is not able to provide any history due to confusion.  Most of the information was taken from reviewing the medical records and from discussion with emergency room physician and the family. Patient was brought to emergency room for acute altered mental status, cough and shortness of breath noticed by family, earlier today.  BiPAP treatment in the emergency room seems to help. She was seen in the emergency room 3 days ago for respiratory distress, that was thought  to be secondary to COPD exacerbation.  Patient was discharged home on a azithromycin and prednisone, which she did not get a chance to start, yet. Blood test done in the emergency room are notable for elevated CO2 at 78 and low O2 at 31on venous ABG.  Potassium is low at 3.3.  WBC count is normal.  Influenza panel is negative.  EKG  is noted without any acute changes; normal sinus rhythm. Chest x-ray, reviewed by myself, shows hyperinflation secondary to COPD, but no infiltrates. Patient is admitted for further evaluation and treatment. VITAL SIGNS:  Blood pressure (!) 172/72, pulse 76, temperature 97.7 F (36.5 C), temperature source Oral, resp. rate (!) 22, height 5\' 7"  (1.702 m), weight 58.4 kg (128 lb 12.8 oz), SpO2 100 %.  I/O:    Intake/Output Summary (Last 24 hours) at 07/26/2017 0854 Last data filed at 07/25/2017 1700 Gross per 24 hour  Intake 360 ml  Output -  Net 360 ml    PHYSICAL EXAMINATION:  GENERAL:  77 y.o.-year-old patient lying in the bed with no acute distress.  EYES: Pupils equal, round, reactive to light and accommodation. No scleral icterus. Extraocular muscles intact.  HEENT: Head atraumatic, normocephalic. Oropharynx and nasopharynx clear.  NECK:  Supple, no jugular venous distention. No thyroid enlargement, no tenderness.  LUNGS: Normal breath sounds bilaterally, no wheezing, rales,rhonchi or crepitation. No use of accessory muscles of respiration.  CARDIOVASCULAR: S1, S2 normal. No murmurs, rubs, or gallops.  ABDOMEN: Soft, non-tender, non-distended. Bowel sounds present. No organomegaly or mass.  EXTREMITIES: No pedal edema, cyanosis, or clubbing.  NEUROLOGIC: Cranial nerves II through XII are intact. Muscle strength 5/5 in all extremities. Sensation intact. Gait not checked.  PSYCHIATRIC: The patient is alert and oriented x 3.  SKIN: No obvious rash, lesion, or ulcer.  DATA REVIEW:   CBC Recent Labs  Lab 07/21/17 2357  WBC 7.1  HGB 12.7  HCT 38.0  PLT  228    Chemistries  Recent Labs  Lab 07/22/17 0538 07/23/17 0409  NA 137  --   K 3.7  --   CL 98*  --   CO2 34*  --   GLUCOSE 147*  --   BUN 15  --   CREATININE 0.69  --   CALCIUM 9.0  --   MG 1.6* 2.3    Cardiac Enzymes Recent Labs  Lab 07/21/17 2357  TROPONINI <0.03    Follow-up Information    Erby Pian, MD Follow up in 1 week(s).   Specialty:  Specialist Why:  Chronic respiratory failure Contact information: [place] [address] [phone]        Tonia Ghent, MD Follow up in 5 day(s).   Specialty:  Family Medicine Why:  Anxiety Contact information: [address] [place]  [address] [phone]              Management plans discussed with the patient, family and they are in agreement.  CODE STATUS: DNR  TOTAL TIME TAKING CARE OF THIS PATIENT: 45 minutes.    Max Sane M.D on 07/26/2017 at 8:54 AM  Between 7am to 6pm - Pager - [phone]  After 6pm go to [email or web address] - password [email or web address] [facility]  Office  [phone]  CC: Primary care physician; Tonia Ghent, MD   Note: This dictation was prepared with Dragon dictation along with smaller phrase technology. Any transcriptional errors that result from this process are unintentional.

## 2017-07-25 LAB — GLUCOSE, CAPILLARY
GLUCOSE-CAPILLARY: 131 mg/dL — AB (ref 65–99)
Glucose-Capillary: 99 mg/dL (ref 65–99)

## 2017-07-25 MED ORDER — IPRATROPIUM-ALBUTEROL 0.5-2.5 (3) MG/3ML IN SOLN: 3.0000 mL | Freq: Four times a day (QID) | RESPIRATORY_TRACT | Status: DC | PRN

## 2017-07-25 MED ORDER — ALBUTEROL SULFATE (2.5 MG/3ML) 0.083% IN NEBU
3.0000 mL | INHALATION_SOLUTION | RESPIRATORY_TRACT | Status: DC | PRN
  Administered 2017-07-25: 3 mL via RESPIRATORY_TRACT
  Filled 2017-07-25: qty 3

## 2017-07-25 MED ORDER — LACTULOSE 10 GM/15ML PO SOLN
30.0000 g | Freq: Two times a day (BID) | ORAL | Status: AC
  Administered 2017-07-25 (×2): 30 g via ORAL
  Filled 2017-07-25 (×2): qty 60

## 2017-07-25 MED ORDER — IPRATROPIUM-ALBUTEROL 0.5-2.5 (3) MG/3ML IN SOLN
3.0000 mL | RESPIRATORY_TRACT | Status: DC
  Administered 2017-07-25 – 2017-07-26 (×6): 3 mL via RESPIRATORY_TRACT
  Filled 2017-07-25 (×7): qty 3

## 2017-07-25 MED ORDER — TIOTROPIUM BROMIDE MONOHYDRATE 18 MCG IN CAPS
18.0000 ug | ORAL_CAPSULE | Freq: Every day | RESPIRATORY_TRACT | Status: DC
  Administered 2017-07-25: 23:00:00 18 ug via RESPIRATORY_TRACT
  Filled 2017-07-25: qty 5

## 2017-07-25 MED ORDER — ALPRAZOLAM 0.5 MG PO TABS
0.5000 mg | ORAL_TABLET | Freq: Four times a day (QID) | ORAL | Status: DC
  Administered 2017-07-25 – 2017-07-26 (×5): 0.5 mg via ORAL
  Filled 2017-07-25 (×5): qty 1

## 2017-07-25 NOTE — Progress Notes (Signed)
[facility] Mcl[facility] at [place] NAME: Mckenzie Mclaughlin    MR#:  [id number]  DATE OF BIRTH:  08-Jul-1940  SUBJECTIVE:  CHIEF COMPLAINT:   Chief Complaint  Patient presents with  . Shortness of Breath  c/o constipation and anxiety  REVIEW OF SYSTEMS:  CONSTITUTIONAL: No fever, fatigue or weakness.  EYES: No blurred or double vision.  EARS, NOSE, AND THROAT: No tinnitus or ear pain.  RESPIRATORY: No cough, shortness of breath, wheezing or hemoptysis.  CARDIOVASCULAR: No chest pain, orthopnea, edema.  GASTROINTESTINAL: No nausea, vomiting, diarrhea or abdominal pain.  GENITOURINARY: No dysuria, hematuria.  ENDOCRINE: No polyuria, nocturia,  HEMATOLOGY: No anemia, easy bruising or bleeding SKIN: No rash or lesion. MUSCULOSKELETAL: No joint pain or arthritis.   NEUROLOGIC: No tingling, numbness, weakness.  PSYCHIATRY: No anxiety or depression.   ROS  DRUG ALLERGIES:   Allergies  Allergen Reactions  . Amoxicillin Shortness Of Breath    Has patient had a PCN reaction causing immediate rash, facial/tongue/throat swelling, SOB or lightheadedness with hypotension: Yes Has patient had a PCN reaction causing severe rash involving mucus membranes or skin necrosis: No Has patient had a PCN reaction that required hospitalization: No Has patient had a PCN reaction occurring within the last 10 years: No If all of the above answers are "NO", then may proceed with Cephalosporin use.   Marland Kitchen Doxycycline Nausea Only and Other (See Comments)    Dizziness   . Ibuprofen Nausea And Vomiting  . Sertraline Hcl Nausea And Vomiting    REACTION: trembling    VITALS:  Blood pressure 130/73, pulse 83, temperature 98.2 F (36.8 C), temperature source Oral, resp. rate 18, height 5\' 7"  (1.702 m), weight 63.4 kg (139 lb 12.8 oz), SpO2 100 %.  PHYSICAL EXAMINATION:  GENERAL:  77 y.o.-year-old patient lying in the bed with no acute distress.  EYES: Pupils equal, round, reactive  to light and accommodation. No scleral icterus. Extraocular muscles intact.  HEENT: Head atraumatic, normocephalic. Oropharynx and nasopharynx clear.  NECK:  Supple, no jugular venous distention. No thyroid enlargement, no tenderness.  LUNGS: Normal breath sounds bilaterally, no wheezing, rales,rhonchi or crepitation. No use of accessory muscles of respiration.  CARDIOVASCULAR: S1, S2 normal. No murmurs, rubs, or gallops.  ABDOMEN: Soft, nontender, nondistended. Bowel sounds present. No organomegaly or mass.  EXTREMITIES: No pedal edema, cyanosis, or clubbing.  NEUROLOGIC: Cranial nerves II through XII are intact. Muscle strength 5/5 in all extremities. Sensation intact. Gait not checked.  PSYCHIATRIC: The patient is alert and oriented x 3.  SKIN: No obvious rash, lesion, or ulcer.   Physical Exam LABORATORY PANEL:   CBC Recent Labs  Lab 07/21/17 2357  WBC 7.1  HGB 12.7  HCT 38.0  PLT 228   ------------------------------------------------------------------------------------------------------------------  Chemistries  Recent Labs  Lab 07/22/17 0538 07/23/17 0409  NA 137  --   K 3.7  --   CL 98*  --   CO2 34*  --   GLUCOSE 147*  --   BUN 15  --   CREATININE 0.69  --   CALCIUM 9.0  --   MG 1.6* 2.3   ------------------------------------------------------------------------------------------------------------------  Cardiac Enzymes Recent Labs  Lab 07/18/17 1412 07/21/17 2357  TROPONINI <0.03 <0.03   ------------------------------------------------------------------------------------------------------------------  RADIOLOGY:  No results found.  ASSESSMENT AND PLAN:  1.Acute on chronic hypercapnic and hypoxemic respiratory failure, secondary to acute COPD exacerbation,caused by acute bronchitis. Resolved Successfully weaned off BiPAP, stable on 2 L via nasal cannula  which is her baseline, continue aggressive pulmonary toilet, empiric Levaquin for 5-day course,  IV Solu-Medrol while in house, and plan for discharge to skilled nursing facility on tomorrow   2.Acute encephalopathy Resolved likely secondary to hypercapnia.  3.Acute COPD exacerbation Resolved secondary to acute bronchitis. Continue Levaquin,nebulizers,oxygen and steroids.  4.Acute bronchitis Resolving with treatment as stated above  5.Hypokalemia Repleted  6.Coronary artery disease Stable on current regiment status post stent placement in the past  7. Hypomagnesemia Repleted  Disposition to skilled nursing facility on tomorrow barring any complications  All the records are reviewed and case discussed with Care Management/Social Workerr. Management plans discussed with the patient, family and they are in agreement.  CODE STATUS: full  TOTAL TIME TAKING CARE OF THIS PATIENT: 35 minutes.     POSSIBLE D/C IN 1-2 DAYS, DEPENDING ON CLINICAL CONDITION.   Mckenzie Mclaughlin M.D on 07/25/2017   Between 7am to 6pm - Pager - [phone]  After 6pm go to [email or web address] - password [email or web address] [facility]  Office  [phone]  CC: Primary care physician; Tonia Ghent, MD  Note: This dictation was prepared with Dragon dictation along with smaller phrase technology. Any transcriptional errors that result from this process are unintentional.

## 2017-07-25 NOTE — Progress Notes (Signed)
Pt c/o having a hard time breathing @ 1215. Pt was assessed. O2 100% on 3L [place]. Respirations 21. Breath sounds were diminished with a slight expiratory wheeze on exhale.Pt requested Xanax, nebulizer treatment, and inhaler. Xanax not available until 1400. Nebulizer given @1239 . Paged MD about pt condition and request for inhaler. Order for Spiriva given. Pt declined Spiriva at this time stating she "takes it at night." Pt reassessed at 1300. Pt stated that she was better.

## 2017-07-26 ENCOUNTER — Encounter
Admission: RE | Admit: 2017-07-26 | Discharge: 2017-07-26 | Disposition: A | Discharge status: Home / Self Care | Payer: Medicare Other | Source: Ambulatory Visit | Attending: Internal Medicine | Admitting: Internal Medicine

## 2017-07-26 DIAGNOSIS — Z7982 Long term (current) use of aspirin: Secondary | ICD-10-CM | POA: Diagnosis not present

## 2017-07-26 DIAGNOSIS — I252 Old myocardial infarction: Secondary | ICD-10-CM | POA: Diagnosis not present

## 2017-07-26 DIAGNOSIS — M6281 Muscle weakness (generalized): Secondary | ICD-10-CM | POA: Diagnosis not present

## 2017-07-26 DIAGNOSIS — E785 Hyperlipidemia, unspecified: Secondary | ICD-10-CM | POA: Diagnosis not present

## 2017-07-26 DIAGNOSIS — Z515 Encounter for palliative care: Secondary | ICD-10-CM | POA: Diagnosis not present

## 2017-07-26 DIAGNOSIS — J449 Chronic obstructive pulmonary disease, unspecified: Secondary | ICD-10-CM | POA: Diagnosis not present

## 2017-07-26 DIAGNOSIS — Z7401 Bed confinement status: Secondary | ICD-10-CM | POA: Diagnosis not present

## 2017-07-26 DIAGNOSIS — E538 Deficiency of other specified B group vitamins: Secondary | ICD-10-CM | POA: Diagnosis not present

## 2017-07-26 DIAGNOSIS — J9611 Chronic respiratory failure with hypoxia: Secondary | ICD-10-CM | POA: Diagnosis not present

## 2017-07-26 DIAGNOSIS — J9610 Chronic respiratory failure, unspecified whether with hypoxia or hypercapnia: Secondary | ICD-10-CM | POA: Diagnosis not present

## 2017-07-26 DIAGNOSIS — J9601 Acute respiratory failure with hypoxia: Secondary | ICD-10-CM | POA: Diagnosis not present

## 2017-07-26 DIAGNOSIS — J440 Chronic obstructive pulmonary disease with acute lower respiratory infection: Secondary | ICD-10-CM | POA: Diagnosis not present

## 2017-07-26 DIAGNOSIS — J441 Chronic obstructive pulmonary disease with (acute) exacerbation: Secondary | ICD-10-CM | POA: Diagnosis not present

## 2017-07-26 DIAGNOSIS — E876 Hypokalemia: Secondary | ICD-10-CM | POA: Diagnosis not present

## 2017-07-26 DIAGNOSIS — Z955 Presence of coronary angioplasty implant and graft: Secondary | ICD-10-CM | POA: Diagnosis not present

## 2017-07-26 DIAGNOSIS — G9340 Encephalopathy, unspecified: Secondary | ICD-10-CM | POA: Diagnosis not present

## 2017-07-26 DIAGNOSIS — F411 Generalized anxiety disorder: Secondary | ICD-10-CM | POA: Diagnosis not present

## 2017-07-26 DIAGNOSIS — Z7951 Long term (current) use of inhaled steroids: Secondary | ICD-10-CM | POA: Diagnosis not present

## 2017-07-26 DIAGNOSIS — R05 Cough: Secondary | ICD-10-CM | POA: Diagnosis not present

## 2017-07-26 DIAGNOSIS — I110 Hypertensive heart disease with heart failure: Secondary | ICD-10-CM | POA: Diagnosis not present

## 2017-07-26 DIAGNOSIS — R262 Difficulty in walking, not elsewhere classified: Secondary | ICD-10-CM | POA: Diagnosis not present

## 2017-07-26 DIAGNOSIS — Z85828 Personal history of other malignant neoplasm of skin: Secondary | ICD-10-CM | POA: Diagnosis not present

## 2017-07-26 DIAGNOSIS — J209 Acute bronchitis, unspecified: Secondary | ICD-10-CM | POA: Diagnosis not present

## 2017-07-26 DIAGNOSIS — I25118 Atherosclerotic heart disease of native coronary artery with other forms of angina pectoris: Secondary | ICD-10-CM | POA: Diagnosis not present

## 2017-07-26 DIAGNOSIS — Z9981 Dependence on supplemental oxygen: Secondary | ICD-10-CM | POA: Diagnosis not present

## 2017-07-26 DIAGNOSIS — I5032 Chronic diastolic (congestive) heart failure: Secondary | ICD-10-CM | POA: Diagnosis not present

## 2017-07-26 DIAGNOSIS — R001 Bradycardia, unspecified: Secondary | ICD-10-CM | POA: Diagnosis not present

## 2017-07-26 DIAGNOSIS — E440 Moderate protein-calorie malnutrition: Secondary | ICD-10-CM | POA: Diagnosis not present

## 2017-07-26 DIAGNOSIS — Z87891 Personal history of nicotine dependence: Secondary | ICD-10-CM | POA: Diagnosis not present

## 2017-07-26 LAB — GLUCOSE, CAPILLARY: GLUCOSE-CAPILLARY: 101 mg/dL — AB (ref 65–99)

## 2017-07-26 MED ORDER — ALPRAZOLAM 0.5 MG PO TABS: 0.5000 mg | ORAL_TABLET | Freq: Three times a day (TID) | ORAL | 0 refills | Status: DC | PRN

## 2017-07-26 NOTE — Discharge Planning (Signed)
Patient IV removed. RN assessment and VS revealed stability for DC to [facility]. Report called and s/w Theodore Demark, LPN. Discharge papers given, explained, educated, signed and placed in packet.  Being DC on 3L-continuous. Informed of suggested FU appts - Facility will set up when able to coordinate with transportation schedule.   Needed scripts signed and also in packet.  [place] DNR signed and in packet. EMS contacted to transport to [place]. Waiting on arrival.

## 2017-07-26 NOTE — Discharge Instructions (Signed)

## 2017-07-26 NOTE — Progress Notes (Signed)
Clinical Social Worker (CSW) presented SNF bed offers to patient. Patient chose [facility] for SNF at discharge. Sharyn Lull, Admissions Coordinator made aware of patient accepting bed offer and Sharyn Lull will begin [facility] authorization for SNF.    Annamaria Boots MSW, [place] [phone]

## 2017-07-26 NOTE — Plan of Care (Signed)
  Problem: Education: Goal: Knowledge of General Education information will improve Outcome: Progressing   Problem: Health Behavior/Discharge Planning: Goal: Ability to manage health-related needs will improve Outcome: Progressing   Problem: Clinical Measurements: Goal: Ability to maintain clinical measurements within normal limits will improve Outcome: Progressing   Problem: Pain Managment: Goal: General experience of comfort will improve Outcome: Progressing   Problem: Safety: Goal: Ability to remain free from injury will improve Outcome: Progressing   Problem: Skin Integrity: Goal: Risk for impaired skin integrity will decrease Outcome: Progressing

## 2017-07-26 NOTE — Progress Notes (Signed)
   07/26/17 1115  Clinical Encounter Type  Visited With Patient and family together  Visit Type Initial  Referral From Nurse;Care management  Consult/Referral To Chaplain  Spiritual Encounters  Spiritual Needs Prayer;Emotional;Grief support   CH received a requested by PT's care team that PT wanted to have a visit from [facility]. [place] reported to PT's RM as she was visited by family. Family left RM as they stated that they would allow PT to speak to me alone. PT is now a DNR and is to be moved today to another care facility. PT was scared and wanted forgiveness from her husband. [place] tried to reassure PT and prayed with her.

## 2017-07-26 NOTE — Progress Notes (Addendum)
Patient is medically stable for discharge today. Clinical Education officer, museum (CSW) informed patient that she will discharge today to [facility]. Patient is in agreement. CSW notified patient's husband Shardai Star who is also in agreement. Sharyn Lull, Admissions Coordinator at [facility] is aware of above and has received [facility] authorization for SNF. Patient will be transported by EMS. Rn to call report and call for transport. CSW signing off. Please re-consult if needs arise.   Annamaria Boots MSW, [place] [phone]

## 2017-07-26 NOTE — Plan of Care (Signed)
Respiratory contacted to give treatment and stated will round shortly.

## 2017-07-26 NOTE — Care Management Important Message (Signed)
Copy of signed IM left in patient's room.    

## 2017-07-26 NOTE — Clinical Social Work Placement (Signed)
   CLINICAL SOCIAL WORK PLACEMENT  NOTE  Date:  07/26/2017  Patient Details  Name: Mckenzie Mclaughlin MRN: [id number] Date of Birth: 02/22/1941  Clinical Social Work is seeking post-discharge placement for this patient at the [place] level of care (*CSW will initial, date and re-position this form in  chart as items are completed):  Yes   Patient/family provided with [place] Work Department's list of facilities offering this level of care within the geographic area requested by the patient (or if unable, by the patient's family).  Yes   Patient/family informed of their freedom to choose among providers that offer the needed level of care, that participate in Medicare, Medicaid or managed care program needed by the patient, have an available bed and are willing to accept the patient.  Yes   Patient/family informed of [facility]'s ownership interest in [facility] and [facility], as well as of the fact that they are under no obligation to receive care at these facilities.  PASRR submitted to EDS on       PASRR number received on       Existing PASRR number confirmed on 07/24/17     FL2 transmitted to all facilities in geographic area requested by pt/family on 07/24/17     FL2 transmitted to all facilities within larger geographic area on       Patient informed that his/her managed care company has contracts with or will negotiate with certain facilities, including the following:        Yes   Patient/family informed of bed offers received.  Patient chooses bed at [facility] )     Physician recommends and patient [facility] bed at [facility])    Patient to be transferred to [facility] ) on 07/26/17.  Patient to be transferred to facility by (EMS)     Patient family notified on 07/26/17 of transfer.  Name of family member notified:  Milagros Loll -Husband )     PHYSICIAN       Additional Comment:     _______________________________________________ Annamaria Boots, [place] 07/26/2017, 11:03 AM

## 2017-07-26 NOTE — Progress Notes (Signed)
Family Meeting Note  Advance Directive:yes  Today a meeting took place with the Patient and spouse.  The following clinical team members were present during this meeting:MD  The following were discussed:Patient's diagnosis: 77 y.o.femalewith a known history of CAD, COPD on continuous 2 L oxygen admitted for Acute on chronic hypercapnic and hypoxemic respiratory failure, secondary to acute COPD exacerbation,caused by acute bronchitis.   . Anginal pain [facility])    see dr [name]  "Spams"  . Anxiety   . Arthritis   . Cancer (HCC)    side of head- skin cancer, squamous cell ca on left foot.  . Chronic airway obstruction (HCC)    chronic airway obstruction not elsewhere classified, unspecified  . Chronic obstructive asthma ([place])    unspecified  . Complication of anesthesia   . Constipation   . COPD (chronic obstructive pulmonary disease) ([place])   . COPD with emphysema ([place]) 01/05/2013  . COPD, severe 07/29/98  . Coronary artery disease 01/22/99   Non Q-wave MI, stent RCA  . Coronary atherosclerosis of native coronary artery 01/22/1999   status post stent RCA,   . Depression   . Dysrhythmia    Palpations at times. last time 10/29 ish 2014  . Esophageal dilatation    Pt needs appple sauce to take meds.  . Esophageal stricture    PT needs apple sauce to take meds  . GERD (gastroesophageal reflux disease) 08/29/98   severe-no meds now  . Head injury, acute, with loss of consciousness ([place]) 11/2012   unsure how long  . History of blood transfusion   . History of blurry vision 05/06-05/10/2009   Hospital [facility] CP R/O'D Blurry vision, smoking  . History of CT scan of head 08/02/09   w/o mild age appropr atrophy  . History of ETT 08/03/09   Myoview Normal  . History of ETT 05/1999   Cardiolite pos ETT, neg Cardiolite  . History of ETT 09/02/01   Normal  . History of ETT 04/28/2006   Myoview normal EF 83%  . History of ETT 04/10/11   normal EF  and no ischemia per Dr. Saralyn Pilar  . History of kidney stones 03/2017  . History of pneumothorax 01/05/2013  . Hx of cardiac catheterization 08/29/01   60% RCA 0/w 20-30% lesions  . Hx of cardiac catheterization 01/22/99   w/Stent 90% RCA lesion  . Hyperlipidemia, unspecified   . Hypertension    03/30/00  . Mental disorder   . On home oxygen therapy    as needed-has a travel tank  . Pneumothorax 2/14  . PONV (postoperative nausea and vomiting)    ad breast remocved and see was under anesthesia a long time  . Shortness of breath   . Status post aortic coarctation stent placement   . Tobacco abuse    1/4 pack daily    Patient's progosis: < 12 months and Goals for treatment: DNR Poor prognosis  Additional follow-up to be provided: Palliative care to follow while at home/facility with transition to Hospice  Time spent during discussion:20 minutes  Max Sane, MD

## 2017-07-27 DIAGNOSIS — J9611 Chronic respiratory failure with hypoxia: Secondary | ICD-10-CM | POA: Diagnosis not present

## 2017-07-27 DIAGNOSIS — E440 Moderate protein-calorie malnutrition: Secondary | ICD-10-CM | POA: Diagnosis not present

## 2017-07-27 DIAGNOSIS — J441 Chronic obstructive pulmonary disease with (acute) exacerbation: Secondary | ICD-10-CM | POA: Diagnosis not present

## 2017-07-28 ENCOUNTER — Encounter
Admission: RE | Admit: 2017-07-28 | Discharge: 2017-07-28 | Disposition: A | Discharge status: Home / Self Care | Payer: Medicare Other | Source: Ambulatory Visit | Attending: Internal Medicine | Admitting: Internal Medicine

## 2017-07-28 ENCOUNTER — Other Ambulatory Visit: Payer: Self-pay

## 2017-07-28 DIAGNOSIS — Z515 Encounter for palliative care: Secondary | ICD-10-CM | POA: Diagnosis not present

## 2017-07-28 DIAGNOSIS — J449 Chronic obstructive pulmonary disease, unspecified: Secondary | ICD-10-CM | POA: Diagnosis not present

## 2017-07-28 MED ORDER — ALPRAZOLAM 0.5 MG PO TABS: 0.5000 mg | ORAL_TABLET | Freq: Three times a day (TID) | ORAL | 0 refills | Status: DC

## 2017-07-28 NOTE — Telephone Encounter (Signed)
Rx sent to [facility] phone : [phone] , fax : [phone]  

## 2017-07-29 ENCOUNTER — Other Ambulatory Visit: Payer: Self-pay

## 2017-07-29 MED ORDER — MORPHINE SULFATE (CONCENTRATE) 20 MG/ML PO SOLN: 5.0000 mg | ORAL | 0 refills | Status: DC | PRN

## 2017-07-29 NOTE — Telephone Encounter (Signed)
Rx sent to [facility] phone : [phone] , fax : [phone]  

## 2017-07-30 ENCOUNTER — Encounter: Payer: Self-pay | Admitting: Gerontology

## 2017-07-30 ENCOUNTER — Non-Acute Institutional Stay (SKILLED_NURSING_FACILITY): Payer: Medicare Other | Admitting: Gerontology

## 2017-07-30 ENCOUNTER — Other Ambulatory Visit
Admission: RE | Admit: 2017-07-30 | Discharge: 2017-07-30 | Disposition: A | Discharge status: Home / Self Care | Payer: Medicare Other | Source: Ambulatory Visit | Attending: Gerontology | Admitting: Gerontology

## 2017-07-30 DIAGNOSIS — Z515 Encounter for palliative care: Secondary | ICD-10-CM | POA: Diagnosis not present

## 2017-07-30 DIAGNOSIS — J449 Chronic obstructive pulmonary disease, unspecified: Secondary | ICD-10-CM

## 2017-07-30 DIAGNOSIS — J9610 Chronic respiratory failure, unspecified whether with hypoxia or hypercapnia: Secondary | ICD-10-CM

## 2017-07-30 DIAGNOSIS — F411 Generalized anxiety disorder: Secondary | ICD-10-CM | POA: Diagnosis not present

## 2017-07-30 DIAGNOSIS — E538 Deficiency of other specified B group vitamins: Secondary | ICD-10-CM | POA: Diagnosis not present

## 2017-07-30 DIAGNOSIS — J440 Chronic obstructive pulmonary disease with acute lower respiratory infection: Secondary | ICD-10-CM | POA: Insufficient documentation

## 2017-07-30 LAB — CBC WITH DIFFERENTIAL/PLATELET
BASOS ABS: 0 10*3/uL (ref 0–0.1)
Basophils Relative: 0 %
EOS ABS: 0 10*3/uL (ref 0–0.7)
EOS PCT: 0 %
HCT: 39.7 % (ref 35.0–47.0)
Hemoglobin: 13.6 g/dL (ref 12.0–16.0)
LYMPHS ABS: 0.3 10*3/uL — AB (ref 1.0–3.6)
Lymphocytes Relative: 4 %
MCH: 29.9 pg (ref 26.0–34.0)
MCHC: 34.3 g/dL (ref 32.0–36.0)
MCV: 87.3 fL (ref 80.0–100.0)
Monocytes Absolute: 0.2 10*3/uL (ref 0.2–0.9)
Monocytes Relative: 3 %
Neutro Abs: 7.4 10*3/uL — ABNORMAL HIGH (ref 1.4–6.5)
Neutrophils Relative %: 93 %
PLATELETS: 212 10*3/uL (ref 150–440)
RBC: 4.55 MIL/uL (ref 3.80–5.20)
RDW: 14.2 % (ref 11.5–14.5)
WBC: 8 10*3/uL (ref 3.6–11.0)

## 2017-07-30 LAB — VITAMIN B12: Vitamin B-12: 785 pg/mL (ref 180–914)

## 2017-07-30 LAB — COMPREHENSIVE METABOLIC PANEL
ALT: 19 U/L (ref 14–54)
AST: 24 U/L (ref 15–41)
Albumin: 3.4 g/dL — ABNORMAL LOW (ref 3.5–5.0)
Alkaline Phosphatase: 59 U/L (ref 38–126)
Anion gap: 8 (ref 5–15)
BUN: 44 mg/dL — ABNORMAL HIGH (ref 6–20)
CHLORIDE: 92 mmol/L — AB (ref 101–111)
CO2: 35 mmol/L — AB (ref 22–32)
Calcium: 9.4 mg/dL (ref 8.9–10.3)
Creatinine, Ser: 1.34 mg/dL — ABNORMAL HIGH (ref 0.44–1.00)
GFR calc non Af Amer: 38 mL/min — ABNORMAL LOW (ref >60)
GFR, EST AFRICAN AMERICAN: 44 mL/min — AB (ref >60)
Glucose, Bld: 137 mg/dL — ABNORMAL HIGH (ref 65–99)
POTASSIUM: 4.7 mmol/L (ref 3.5–5.1)
SODIUM: 135 mmol/L (ref 135–145)
Total Bilirubin: 0.6 mg/dL (ref 0.3–1.2)
Total Protein: 6.6 g/dL (ref 6.5–8.1)

## 2017-07-30 NOTE — Progress Notes (Signed)
Location:   [place] Room Number: [place] of Service:  SNF [phone]) Provider:  Toni Arthurs, NP-C  Tonia Ghent, MD  Patient Care Team: Tonia Ghent, MD as PCP - General (Family Medicine) Grace Isaac, MD as Consulting Physician (Cardiothoracic Surgery) Nestor Lewandowsky, MD as Referring Physician (Thoracic Diseases) Erby Pian, MD as Referring Physician Erby Pian, MD as Referring Physician Pleasant, Eppie Gibson, RN as [place] [facility]  Extended Emergency Contact Information Primary Emergency Contact: Gabay,Richard Address: [address]          [place], [place] [address] Johnnette Litter of [place] Phone: [phone] Relation: Spouse Secondary Emergency Contact: Geralyn Corwin Address: [address]          [place], [place] [address] Johnnette Litter of [place] Phone: [phone] Mobile Phone: [phone] Relation: Sister  Code Status: DNR Goals of care: Advanced Directive information Advanced Directives 07/30/2017  Does Patient Have a Medical Advance Directive? Yes  Type of Advance Directive Out of facility DNR (pink MOST or yellow form)  Does patient want to make changes to medical advance directive? No - Patient declined  Copy of [place] in Chart? -  Would patient like information on creating a medical advance directive? -  Pre-existing out of facility DNR order (yellow form or pink MOST form) -     Chief Complaint  Patient presents with  . Medical Management of Chronic Issues    Routine Visit    HPI:  Pt is a 77 y.o. female seen today for medical management of chronic diseases. Pt was admitted to the facility for rehab following hospitalization for COPD exacerbation, respiratory failure. Pt has been attempting to work with PT/OT but progression is limited by severe dyspnea. Pt also has generalized anxiety disorder of which she takes scheduled xanax. Pt has dyspnea at rest,  exacerbated by the anxiety. Pt was started on prn Roxanol several days ago. She has ben taking doses intermittently. She does report this does help her breathing and is not sedating. She reports the xanax also helps with the dyspnea, but some times does not last long enough. Pt and I (sister and husband also present) had lengthy discussion about the use of Roxanol/ how and why it works as well as the use of the xanax for dyspnea. Pt agreeable to using the Roxanol more frequently for dyspnea, as she did not realize she could take it as often as every hour. Pt also agreeable to plan to increase the xanax. Pt also verbalized concern she is not longer receiving the IM B12 injections she used to receive. She reports they made her feel a lot better when she had them. Pt wants to have her B12 levels checked. She reports she is tired all the time and has no energy and is associating this with the B12 deficiency rather than the respiratory failure. Discussed with sister the benefits of Hospice services and the medications used. Discussed the patient's likely prognosis. Sister states the other siblings will be here for the Care Plan meeting next week and will discuss the options while everyone is together. Sister thanked me for the discussion. Of note, nursing later reports the patient realized the Roxanol was morphine and then adamantly refused the medication despite the benefit she was receiving from it. Medication then changed to OxyFast for patient comfort/ peace of mind.      Past Medical History:  Diagnosis Date  . Anginal pain ([place])  see dr [name]  "Spams"  . Anxiety   . Arthritis   . Cancer (HCC)    side of head- skin cancer, squamous cell ca on left foot.  . Chronic airway obstruction (HCC)    chronic airway obstruction not elsewhere classified, unspecified  . Chronic obstructive asthma ([place])    unspecified  . Complication of anesthesia   . Constipation   . COPD (chronic obstructive  pulmonary disease) ([place])   . COPD with emphysema ([place]) 01/05/2013  . COPD, severe 07/29/98  . Coronary artery disease 01/22/99   Non Q-wave MI, stent RCA  . Coronary atherosclerosis of native coronary artery 01/22/1999   status post stent RCA,   . Depression   . Dysrhythmia    Palpations at times. last time 10/29 ish 2014  . Esophageal dilatation    Pt needs appple sauce to take meds.  . Esophageal stricture    PT needs apple sauce to take meds  . GERD (gastroesophageal reflux disease) 08/29/98   severe-no meds now  . Head injury, acute, with loss of consciousness ([place]) 11/2012   unsure how long  . History of blood transfusion   . History of blurry vision 05/06-05/10/2009   Hospital [facility] CP R/O'D Blurry vision, smoking  . History of CT scan of head 08/02/09   w/o mild age appropr atrophy  . History of ETT 08/03/09   Myoview Normal  . History of ETT 05/1999   Cardiolite pos ETT, neg Cardiolite  . History of ETT 09/02/01   Normal  . History of ETT 04/28/2006   Myoview normal EF 83%  . History of ETT 04/10/11   normal EF and no ischemia per Dr. Saralyn Pilar  . History of kidney stones 03/2017  . History of pneumothorax 01/05/2013  . Hx of cardiac catheterization 08/29/01   60% RCA 0/w 20-30% lesions  . Hx of cardiac catheterization 01/22/99   w/Stent 90% RCA lesion  . Hyperlipidemia, unspecified   . Hypertension    03/30/00  . Mental disorder   . On home oxygen therapy    as needed-has a travel tank  . Pneumothorax 2/14  . PONV (postoperative nausea and vomiting)    ad breast remocved and see was under anesthesia a long time  . Shortness of breath   . Status post aortic coarctation stent placement   . Tobacco abuse    1/4 pack daily   Past Surgical History:  Procedure Laterality Date  . BACK SURGERY    . BREAST SURGERY     implant removal, R breast  . CARDIAC CATHETERIZATION    . CAROTID STENT Right 2000  . CATARACT EXTRACTION Bilateral    2016  . COLONOSCOPY    .  CYSTOSCOPY W/ RETROGRADES Bilateral 04/28/2017   Procedure: CYSTOSCOPY WITH RETROGRADE PYELOGRAM;  Surgeon: Hollice Espy, MD;  Location: [facility] OR[facility];  Service: Urology;  Laterality: Bilateral;  . CYSTOSCOPY/URETEROSCOPY/HOLMIUM LASER/STENT PLACEMENT Left 04/28/2017   Procedure: CYSTOSCOPY/URETEROSCOPY/HOLMIUM LASER/STENT PLACEMENT;  Surgeon: Hollice Espy, MD;  Location: [facility] ORS;  Service: Urology;  Laterality: Left;  . ESOPHAGEAL DILATION  06/00   EGD  . LAMINECTOMY  1976   Disc Removal X 2 Lumbar  . MASTECTOMY  03/1976   Bilateral due FCBD with implants [facility])  . MASTECTOMY Left    due to fibrocystic breast disease  . OOPHORECTOMY    . TISSUE EXPANDER PLACEMENT Right 02/03/2013   Procedure: REMOVAL OF RIGHT BREAST IMPLANT AND IMPLANT MATERIAL  CAPSULECTOMY;  Surgeon: Irene Limbo, MD;  Location: [place];  Service: Plastics;  Laterality: Right;  . TOTAL ABDOMINAL HYSTERECTOMY W/ BILATERAL SALPINGOOPHORECTOMY      Allergies  Allergen Reactions  . Amoxicillin Shortness Of Breath    Has patient had a PCN reaction causing immediate rash, facial/tongue/throat swelling, SOB or lightheadedness with hypotension: Yes Has patient had a PCN reaction causing severe rash involving mucus membranes or skin necrosis: No Has patient had a PCN reaction that required hospitalization: No Has patient had a PCN reaction occurring within the last 10 years: No If all of the above answers are "NO", then may proceed with Cephalosporin use.   Marland Kitchen Doxycycline Nausea Only and Other (See Comments)    Dizziness   . Ibuprofen Nausea And Vomiting  . Sertraline Hcl Nausea And Vomiting    REACTION: trembling    Allergies as of 07/30/2017      Reactions   Amoxicillin Shortness Of Breath   Has patient had a PCN reaction causing immediate rash, facial/tongue/throat swelling, SOB or lightheadedness with hypotension: Yes Has patient had a PCN reaction causing severe rash involving mucus membranes or skin necrosis:  No Has patient had a PCN reaction that required hospitalization: No Has patient had a PCN reaction occurring within the last 10 years: No If all of the above answers are "NO", then may proceed with Cephalosporin use.   Doxycycline Nausea Only, Other (See Comments)   Dizziness    Ibuprofen Nausea And Vomiting   Sertraline Hcl Nausea And Vomiting   REACTION: trembling      Medication List        Accurate as of 07/30/17  3:20 PM. Always use your most recent med list.          ALPRAZolam 0.5 MG tablet Commonly known as:  XANAX Take 1 tablet (0.5 mg total) by mouth 3 (three) times daily.   aspirin EC 81 MG tablet Take 81 mg by mouth daily.   atorvastatin 20 MG tablet Commonly known as:  LIPITOR Take 20 mg by mouth daily.   budesonide-formoterol 160-4.5 MCG/ACT inhaler Commonly known as:  SYMBICORT Inhale 2 puffs into the lungs 2 (two) times daily.   clopidogrel 75 MG tablet Commonly known as:  PLAVIX TAKE 1 TABLET BY MOUTH ONCE A DAY   diltiazem 60 MG tablet Commonly known as:  CARDIZEM Take 1 tablet (60 mg total) by mouth 3 (three) times daily.   docusate sodium 100 MG capsule Commonly known as:  COLACE Take 1 capsule (100 mg total) by mouth 2 (two) times daily.   furosemide 40 MG tablet Commonly known as:  LASIX Take 1 tablet (40 mg total) by mouth daily.   ipratropium-albuterol 0.5-2.5 (3) MG/3ML Soln Commonly known as:  DUONEB Take 3 mLs by nebulization 3 (three) times daily.   ipratropium-albuterol 0.5-2.5 (3) MG/3ML Soln Commonly known as:  DUONEB Take 3 mLs by nebulization every 4 (four) hours as needed (shortness of breath).   magnesium hydroxide 400 MG/5ML suspension Commonly known as:  MILK OF MAGNESIA Take 30 mLs by mouth daily.   Melatonin 3 MG Tabs Take 3 mg by mouth at bedtime.   metoprolol succinate 50 MG 24 hr tablet Commonly known as:  TOPROL-XL Take 1 tablet (50 mg total) by mouth daily.   morphine 20 MG/ML concentrated  solution Commonly known as:  ROXANOL Take 0.25-0.5 mLs (5-10 mg total) by mouth every hour as needed. 0.25 - 0.5 ml   nitroGLYCERIN 0.4 MG SL tablet Commonly known as:  NITROSTAT Place 1  tablet (0.4 mg total) under the tongue every 5 (five) minutes as needed for chest pain (max 3 doses in 15 min).   OXYGEN Inhale 2-3 L into the lungs continuous.   potassium chloride 10 MEQ tablet Commonly known as:  K-DUR Take 1 tablet (10 mEq total) by mouth 2 (two) times daily.   predniSONE 50 MG tablet Commonly known as:  DELTASONE 1 po daily   PROAIR HFA 108 (90 Base) MCG/ACT inhaler Generic drug:  albuterol INHALE 2 PUFFS BY MOUTH EVERY 6 HOURS ASNEEDED   ranolazine 500 MG 12 hr tablet Commonly known as:  RANEXA Take 1 tablet (500 mg total) by mouth 2 (two) times daily.   sodium chloride 0.65 % Soln nasal spray Commonly known as:  OCEAN Place 2 sprays into both nostrils as needed for congestion. dry sinuses, irritation. May keep at bedside.   SPIRIVA HANDIHALER 18 MCG inhalation capsule Generic drug:  tiotropium Inhale 33mg once daily   traMADol 50 MG tablet Commonly known as:  ULTRAM Take 25 mg by mouth every 4 (four) hours as needed.   traMADol 50 MG tablet Commonly known as:  ULTRAM Take 25 mg by mouth at bedtime.   venlafaxine 37.5 MG tablet Commonly known as:  EFFEXOR Take 37.5 mg by mouth daily.       Review of Systems  Constitutional: Positive for fatigue. Negative for activity change, appetite change, chills, diaphoresis and fever.  HENT: Negative for congestion, mouth sores, nosebleeds, postnasal drip, sneezing, sore throat, trouble swallowing and voice change.   Respiratory: Positive for cough and shortness of breath. Negative for apnea, choking, chest tightness and wheezing.   Cardiovascular: Negative for chest pain, palpitations and leg swelling.  Gastrointestinal: Negative for abdominal distention, abdominal pain, constipation, diarrhea and nausea.   Genitourinary: Negative for difficulty urinating, dysuria, frequency and urgency.  Musculoskeletal: Negative for back pain, gait problem and myalgias. Arthralgias: typical arthritis.  Skin: Negative for color change, pallor, rash and wound.  Neurological: Positive for weakness. Negative for dizziness, tremors, syncope, speech difficulty, numbness and headaches.  Psychiatric/Behavioral: Negative for agitation and behavioral problems. The patient is nervous/anxious.   All other systems reviewed and are negative.   Immunization History  Administered Date(s) Administered  . Influenza Split 01/23/2011, 12/16/2011  . Influenza Whole 12/28/2001, 01/03/2008, 12/11/2009  . Influenza,inj,Quad PF,6+ Mos 12/21/2012, 12/20/2013, 12/18/2014, 12/19/2015, 12/08/2016  . Pneumococcal Conjugate-13 08/10/2014  . Pneumococcal Polysaccharide-23 01/20/2010, 12/05/2015  . Td 04/30/2005, 04/05/2017   Pertinent  Health Maintenance Due  Topic Date Due  . COLONOSCOPY  05/22/2017  . MAMMOGRAM  12/04/2025 (Originally 12/31/2014)  . INFLUENZA VACCINE  10/28/2017  . DEXA SCAN  Completed  . PNA vac Low Risk Adult  Completed   Fall Risk  05/21/2017 04/21/2017 04/05/2017 12/05/2015 01/22/2015  Falls in the past year? Yes Yes Yes No No  Number falls in past yr: '1 1 1 ' - -  Injury with Fall? Yes Yes Yes - -  Risk Factor Category  High Fall Risk High Fall Risk High Fall Risk - -  Risk for fall due to : History of fall(s);Impaired mobility;Impaired balance/gait History of fall(s);Impaired balance/gait;Impaired mobility History of fall(s);Impaired mobility - -  Follow up Falls evaluation completed;Education provided;Falls prevention discussed Falls evaluation completed;Falls prevention discussed;Education provided Falls evaluation completed;Falls prevention discussed - -   Functional Status Survey:    Vitals:   07/30/17 1506  BP: (!) 118/39  Pulse: 82  Resp: 16  Temp: 98 F (36.7 C)  TempSrc: Oral  SpO2: 99%  Weight:  140 lb 12.8 oz (63.9 kg)  Height: '5\' 7"'  (1.702 m)   Body mass index is 22.05 kg/m. Physical Exam  Constitutional: She is oriented to person, place, and time. Vital signs are normal. She appears well-developed and well-nourished. She is active and cooperative. She does not appear ill. No distress. Nasal cannula in place.  HENT:  Head: Normocephalic and atraumatic.  Mouth/Throat: Uvula is midline, oropharynx is clear and moist and mucous membranes are normal. Mucous membranes are not pale, not dry and not cyanotic.  Eyes: Pupils are equal, round, and reactive to light. Conjunctivae, EOM and lids are normal.  Neck: Trachea normal, normal range of motion and full passive range of motion without pain. Neck supple. No JVD present. No tracheal deviation, no edema and no erythema present. No thyromegaly present.  Cardiovascular: Normal rate, regular rhythm, normal heart sounds, intact distal pulses and normal pulses. Exam reveals no gallop, no distant heart sounds and no friction rub.  No murmur heard. Pulses:      Dorsalis pedis pulses are 2+ on the right side, and 2+ on the left side.  No edema  Pulmonary/Chest: Accessory muscle usage present. She is in respiratory distress. She has decreased breath sounds in the right middle field, the right lower field, the left middle field and the left lower field. She has no wheezes. She has no rhonchi. She has no rales. She exhibits no tenderness.  Abdominal: Soft. Normal appearance and bowel sounds are normal. She exhibits no distension and no ascites. There is no tenderness.  Musculoskeletal: Normal range of motion. She exhibits no edema or tenderness.  Expected osteoarthritis, stiffness; Bilateral Calves soft, supple. Negative Homan's Sign. B- pedal pulses equal; generalized weakness  Neurological: She is alert and oriented to person, place, and time. She has normal strength.  Skin: Skin is warm, dry and intact. She is not diaphoretic. No cyanosis. No  pallor. Nails show no clubbing.  Psychiatric: Her speech is normal and behavior is normal. Judgment and thought content normal. Her mood appears anxious. Cognition and memory are normal.  Nursing note and vitals reviewed.   Labs reviewed: Recent Labs    09/25/16 1437  07/21/17 1817 07/21/17 2357 07/22/17 0538 07/23/17 0409  NA 137   < > 140 135 137  --   K 3.5   < > 3.3* 3.3* 3.7  --   CL 98*   < > 96* 95* 98*  --   CO2 29   < > 36* 33* 34*  --   GLUCOSE 98   < > 113* 149* 147*  --   BUN 18   < > '18 17 15  ' --   CREATININE 1.12*   < > 0.85 0.80 0.69  --   CALCIUM 10.2   < > 9.3 9.2 9.0  --   MG 2.0  --   --   --  1.6* 2.3  PHOS 3.0  --   --   --   --   --    < > = values in this interval not displayed.   Recent Labs    10/26/16 0530 03/20/17 1837 06/18/17 1018  AST '22 26 17  ' ALT '17 16 11  ' ALKPHOS 65 71 74  BILITOT 0.4 0.6 0.4  PROT 7.0 7.4 6.9  ALBUMIN 3.4* 3.7 3.7   Recent Labs    10/10/16 0053 10/26/16 0530  06/18/17 1018 07/18/17 1412 07/21/17 1817 07/21/17 2357  WBC 12.0* 5.6   < >  8.8 7.7 8.8 7.1  NEUTROABS 9.1* 3.5  --  6.5  --   --   --   HGB 12.9 12.5   < > 12.5 12.4 12.6 12.7  HCT 39.0 37.6   < > 38.4 36.0 38.2 38.0  MCV 87.6 88.4   < > 90.3 86.9 89.3 86.8  PLT 207 314   < > 280.0 242 273 228   < > = values in this interval not displayed.   Lab Results  Component Value Date   TSH 0.223 (L) 05/27/2013   Lab Results  Component Value Date   HGBA1C 5.2 04/21/2011   Lab Results  Component Value Date   CHOL 125 10/10/2016   HDL 52 10/10/2016   LDLCALC 55 10/10/2016   TRIG 89 10/10/2016   CHOLHDL 2.4 10/10/2016    Significant Diagnostic Results in last 30 days:  Dg Chest 2 View  Result Date: 07/21/2017 CLINICAL DATA:  Shortness of Breath EXAM: CHEST - 2 VIEW COMPARISON:  07/18/2017 FINDINGS: Mild hyperinflation/COPD. Heart is upper limits normal in size. No confluent opacities or effusions. No acute bony abnormality. IMPRESSION:  Hyperinflation/COPD.  No active disease. Electronically Signed   By: Rolm Baptise M.D.   On: 07/21/2017 19:03   Dg Chest 2 View  Result Date: 07/18/2017 CLINICAL DATA:  Shortness of breath since this morning. History of COPD. EXAM: CHEST - 2 VIEW COMPARISON:  Chest radiograph September 28, 2016 FINDINGS: Cardiomediastinal silhouette is normal. Calcified aortic arch. No pleural effusions or focal consolidations. Mild chronic interstitial changes increased lung volumes. Stable RIGHT lung base scarring. Trachea projects midline and there is no pneumothorax. Soft tissue planes and included osseous structures are non-suspicious. Calcified LEFT breast implant. Mild scoliosis. IMPRESSION: COPD.  No acute cardiopulmonary process. Aortic Atherosclerosis (ICD10-I70.0). Electronically Signed   By: Elon Alas M.D.   On: 07/18/2017 15:01    Assessment/Plan  Chronic obstructive pulmonary disease, unspecified COPD type (HCC)  Chronic respiratory failure, unspecified whether with hypoxia or hypercapnia (HCC)  Generalized anxiety disorder  B12 deficiency   Continue O2 2-3 liters [place]  Continue current medication regimen, except  Increase xanax to 0.5 mg po QID  DC morphine  Add Oxycodone 20 mg/ mL 0.25 mL po Q 4 hours. Hold for sedation  Oxycodone 20 mg/ml 0.25-0.5 mL po Q 1 hour prn- dyspnea  Labs per pt request  2- view CXR  Continue Palliative Care services  Family/ staff Communication:   Total Time: 40 minutes  Documentation:  Face to Face: 25 minutes  Family/Phone: 15 minutes   Labs/tests ordered:  Cbc, met c, B12, 2- view CXR  Medication list reviewed and assessed for continued appropriateness. Monthly medication orders reviewed and signed.  Vikki Ports, NP-C Geriatrics [facility] [phone], [place] [address] Cell Phone (Mon-Fri 8am-5pm):  [phone] On Call:  [phone] & follow prompts after 5pm & weekends Office  Phone:  [phone] Office Fax:  [phone]

## 2017-08-04 ENCOUNTER — Other Ambulatory Visit: Payer: Self-pay

## 2017-08-04 DIAGNOSIS — J449 Chronic obstructive pulmonary disease, unspecified: Secondary | ICD-10-CM | POA: Diagnosis not present

## 2017-08-04 DIAGNOSIS — Z515 Encounter for palliative care: Secondary | ICD-10-CM | POA: Diagnosis not present

## 2017-08-04 MED ORDER — ALPRAZOLAM 0.5 MG PO TABS
0.5000 mg | ORAL_TABLET | Freq: Four times a day (QID) | ORAL | 1 refills | Status: DC
Start: 2017-08-03 — End: 2017-08-10

## 2017-08-04 MED ORDER — OXYCODONE HCL 20 MG/ML PO CONC: 5.0000 mg | ORAL | 0 refills | Status: DC | PRN

## 2017-08-04 NOTE — Telephone Encounter (Signed)
Rx sent to [facility] phone : [phone] , fax : [phone]  

## 2017-08-05 ENCOUNTER — Non-Acute Institutional Stay (SKILLED_NURSING_FACILITY): Payer: Medicare Other | Admitting: Gerontology

## 2017-08-05 DIAGNOSIS — J9610 Chronic respiratory failure, unspecified whether with hypoxia or hypercapnia: Secondary | ICD-10-CM | POA: Diagnosis not present

## 2017-08-06 ENCOUNTER — Encounter: Payer: Self-pay | Admitting: Gerontology

## 2017-08-06 NOTE — Progress Notes (Signed)
Location:   [facility] Room Number: [place] of Service:  SNF [phone]) Provider:  Toni Arthurs, NP-C  Tonia Ghent, MD  Patient Care Team: Tonia Ghent, MD as PCP - General (Family Medicine) Grace Isaac, MD as Consulting Physician (Cardiothoracic Surgery) Nestor Lewandowsky, MD as Referring Physician (Thoracic Diseases) Erby Pian, MD as Referring Physician Erby Pian, MD as Referring Physician Pleasant, Eppie Gibson, RN as [facility]  Extended Emergency Contact Information Primary Emergency Contact: Winfree,Richard Address: [address]          [place], [place] [address] Johnnette Litter of [place] Phone: [phone] Relation: Spouse Secondary Emergency Contact: Geralyn Corwin Address: [address]          [place], [place] [address] Johnnette Litter of [place] Phone: [phone] Mobile Phone: [phone] Relation: Sister  Code Status:  DNR Goals of care: Advanced Directive information Advanced Directives 08/06/2017  Does Patient Have a Medical Advance Directive? Yes  Type of Advance Directive Out of facility DNR (pink MOST or yellow form)  Does patient want to make changes to medical advance directive? No - Patient declined  Copy of [place] in Chart? -  Would patient like information on creating a medical advance directive? -  Pre-existing out of facility DNR order (yellow form or pink MOST form) -     Chief Complaint  Patient presents with  . Acute Visit    Decreased level of Consciouness    HPI:  Pt is a 77 y.o. female seen today for an acute visit for respiratory failure. Nursing and therapies asked me to evaluate pt d/t decreased level of consciousness and dyspnea today. Pt is much less alert than yesterday. Reports she "just doesn't feel good." Pt will open her eyes after several times saying her name, but closes them right back. She has tachypnea and dyspnea. Moderate  secretions audible. She has a gray tint to her skin. Heart sounds are distant and irregular. Feet mottling and cool. I had a long discussion with pt's sisters and husband about her condition. I explained she appeared to be dying and described the signs I was seeing. We discussed care options, such as continuing to attempt rehab until Monday when insurance is stopping payment, stopping rehab and transitioning to Hospice or transferring to the [facility] for EOL care. Family opted for continuing to attempt rehab d/t financial constraints. However, we would increase comfort measures, as well. All family was in agreement. Included in discussions was myself, Pt's sisters x 4, Brother, sister in Sports coach, husband, Lorelee Cover, [place] NP also updated.   Please note pt with limited verbal/cognitive ability. Unable to obtain complete ROS. Some ROS info obtained from staff and documentation.   Past Medical History:  Diagnosis Date  . Anginal pain [facility])    see dr [name]  "Spams"  . Anxiety   . Arthritis   . Cancer (HCC)    side of head- skin cancer, squamous cell ca on left foot.  . Chronic airway obstruction (HCC)    chronic airway obstruction not elsewhere classified, unspecified  . Chronic obstructive asthma ([place])    unspecified  . Complication of anesthesia   . Constipation   . COPD (chronic obstructive pulmonary disease) ([place])   . COPD with emphysema ([place]) 01/05/2013  . COPD, severe 07/29/98  . Coronary artery disease 01/22/99   Non Q-wave MI, stent RCA  . Coronary atherosclerosis of native  coronary artery 01/22/1999   status post stent RCA,   . Depression   . Dysrhythmia    Palpations at times. last time 10/29 ish 2014  . Esophageal dilatation    Pt needs appple sauce to take meds.  . Esophageal stricture    PT needs apple sauce to take meds  . GERD (gastroesophageal reflux disease) 08/29/98   severe-no meds now  . Head injury, acute, with loss of consciousness ([place]) 11/2012    unsure how long  . History of blood transfusion   . History of blurry vision 05/06-05/10/2009   Hospital [facility] CP R/O'D Blurry vision, smoking  . History of CT scan of head 08/02/09   w/o mild age appropr atrophy  . History of ETT 08/03/09   Myoview Normal  . History of ETT 05/1999   Cardiolite pos ETT, neg Cardiolite  . History of ETT 09/02/01   Normal  . History of ETT 04/28/2006   Myoview normal EF 83%  . History of ETT 04/10/11   normal EF and no ischemia per Dr. Saralyn Pilar  . History of kidney stones 03/2017  . History of pneumothorax 01/05/2013  . Hx of cardiac catheterization 08/29/01   60% RCA 0/w 20-30% lesions  . Hx of cardiac catheterization 01/22/99   w/Stent 90% RCA lesion  . Hyperlipidemia, unspecified   . Hypertension    03/30/00  . Mental disorder   . On home oxygen therapy    as needed-has a travel tank  . Pneumothorax 2/14  . PONV (postoperative nausea and vomiting)    ad breast remocved and see was under anesthesia a long time  . Shortness of breath   . Status post aortic coarctation stent placement   . Tobacco abuse    1/4 pack daily   Past Surgical History:  Procedure Laterality Date  . BACK SURGERY    . BREAST SURGERY     implant removal, R breast  . CARDIAC CATHETERIZATION    . CAROTID STENT Right 2000  . CATARACT EXTRACTION Bilateral    2016  . COLONOSCOPY    . CYSTOSCOPY W/ RETROGRADES Bilateral 04/28/2017   Procedure: CYSTOSCOPY WITH RETROGRADE PYELOGRAM;  Surgeon: Hollice Espy, MD;  Location: [facility] ORS;  Service: Urology;  Laterality: Bilateral;  . CYSTOSCOPY/URETEROSCOPY/HOLMIUM LASER/STENT PLACEMENT Left 04/28/2017   Procedure: CYSTOSCOPY/URETEROSCOPY/HOLMIUM LASER/STENT PLACEMENT;  Surgeon: Hollice Espy, MD;  Location: [facility] ORS;  Service: Urology;  Laterality: Left;  . ESOPHAGEAL DILATION  06/00   EGD  . LAMINECTOMY  1976   Disc Removal X 2 Lumbar  . MASTECTOMY  03/1976   Bilateral due FCBD with implants [facility])  . MASTECTOMY  Left    due to fibrocystic breast disease  . OOPHORECTOMY    . TISSUE EXPANDER PLACEMENT Right 02/03/2013   Procedure: REMOVAL OF RIGHT BREAST IMPLANT AND IMPLANT MATERIAL  CAPSULECTOMY;  Surgeon: Irene Limbo, MD;  Location: [facility];  Service: Plastics;  Laterality: Right;  . TOTAL ABDOMINAL HYSTERECTOMY W/ BILATERAL SALPINGOOPHORECTOMY      Allergies  Allergen Reactions  . Amoxicillin Shortness Of Breath    Has patient had a PCN reaction causing immediate rash, facial/tongue/throat swelling, SOB or lightheadedness with hypotension: Yes Has patient had a PCN reaction causing severe rash involving mucus membranes or skin necrosis: No Has patient had a PCN reaction that required hospitalization: No Has patient had a PCN reaction occurring within the last 10 years: No If all of the above answers are "NO", then may proceed with Cephalosporin use.   Marland Kitchen  Doxycycline Nausea Only and Other (See Comments)    Dizziness   . Ibuprofen Nausea And Vomiting  . Sertraline Hcl Nausea And Vomiting    REACTION: trembling    Allergies as of 08/05/2017      Reactions   Amoxicillin Shortness Of Breath   Has patient had a PCN reaction causing immediate rash, facial/tongue/throat swelling, SOB or lightheadedness with hypotension: Yes Has patient had a PCN reaction causing severe rash involving mucus membranes or skin necrosis: No Has patient had a PCN reaction that required hospitalization: No Has patient had a PCN reaction occurring within the last 10 years: No If all of the above answers are "NO", then may proceed with Cephalosporin use.   Doxycycline Nausea Only, Other (See Comments)   Dizziness    Ibuprofen Nausea And Vomiting   Sertraline Hcl Nausea And Vomiting   REACTION: trembling      Medication List        Accurate as of 08/05/17 11:59 PM. Always use your most recent med list.          ALPRAZolam 0.5 MG tablet Commonly known as:  XANAX Take 1 tablet (0.5 mg total) by mouth 4 (four)  times daily.   aspirin EC 81 MG tablet Take 81 mg by mouth daily.   atorvastatin 20 MG tablet Commonly known as:  LIPITOR Take 20 mg by mouth daily.   budesonide-formoterol 160-4.5 MCG/ACT inhaler Commonly known as:  SYMBICORT Inhale 2 puffs into the lungs 2 (two) times daily.   clopidogrel 75 MG tablet Commonly known as:  PLAVIX TAKE 1 TABLET BY MOUTH ONCE A DAY   diltiazem 60 MG tablet Commonly known as:  CARDIZEM Take 1 tablet (60 mg total) by mouth 3 (three) times daily.   docusate sodium 100 MG capsule Commonly known as:  COLACE Take 1 capsule (100 mg total) by mouth 2 (two) times daily.   furosemide 40 MG tablet Commonly known as:  LASIX Take 1 tablet (40 mg total) by mouth daily.   ipratropium-albuterol 0.5-2.5 (3) MG/3ML Soln Commonly known as:  DUONEB Take 3 mLs by nebulization 3 (three) times daily.   ipratropium-albuterol 0.5-2.5 (3) MG/3ML Soln Commonly known as:  DUONEB Take 3 mLs by nebulization every 4 (four) hours as needed (shortness of breath).   magnesium hydroxide 400 MG/5ML suspension Commonly known as:  MILK OF MAGNESIA Take 30 mLs by mouth daily.   Melatonin 3 MG Tabs Take 3 mg by mouth at bedtime.   metoprolol succinate 50 MG 24 hr tablet Commonly known as:  TOPROL-XL Take 1 tablet (50 mg total) by mouth daily.   morphine 20 MG/ML concentrated solution Commonly known as:  ROXANOL Take 0.25-0.5 mLs (5-10 mg total) by mouth every hour as needed. 0.25 - 0.5 ml   nitroGLYCERIN 0.4 MG SL tablet Commonly known as:  NITROSTAT Place 1 tablet (0.4 mg total) under the tongue every 5 (five) minutes as needed for chest pain (max 3 doses in 15 min).   oxyCODONE 20 MG/ML concentrated solution Commonly known as:  ROXICODONE INTENSOL Take 0.3 mLs (6 mg total) by mouth every 4 (four) hours as needed.   oxyCODONE 20 MG/ML concentrated solution Commonly known as:  ROXICODONE INTENSOL Take 0.3-0.5 mLs (6-10 mg total) by mouth every hour as needed.     OXYGEN Inhale 2-3 L into the lungs continuous.   potassium chloride 10 MEQ tablet Commonly known as:  K-DUR Take 1 tablet (10 mEq total) by mouth 2 (two) times daily.  predniSONE 50 MG tablet Commonly known as:  DELTASONE 1 po daily   PROAIR HFA 108 (90 Base) MCG/ACT inhaler Generic drug:  albuterol INHALE 2 PUFFS BY MOUTH EVERY 6 HOURS ASNEEDED   ranolazine 500 MG 12 hr tablet Commonly known as:  RANEXA Take 1 tablet (500 mg total) by mouth 2 (two) times daily.   sodium chloride 0.65 % Soln nasal spray Commonly known as:  OCEAN Place 2 sprays into both nostrils as needed for congestion. dry sinuses, irritation. May keep at bedside.   SPIRIVA HANDIHALER 18 MCG inhalation capsule Generic drug:  tiotropium Inhale 84mcg once daily   traMADol 50 MG tablet Commonly known as:  ULTRAM Take 25 mg by mouth every 4 (four) hours as needed.   traMADol 50 MG tablet Commonly known as:  ULTRAM Take 25 mg by mouth at bedtime.   venlafaxine 37.5 MG tablet Commonly known as:  EFFEXOR Take 37.5 mg by mouth daily.       Review of Systems  Unable to perform ROS: Severe respiratory distress  Constitutional: Positive for activity change, appetite change and fatigue. Negative for chills, diaphoresis and fever.  HENT: Negative for congestion, mouth sores, nosebleeds, postnasal drip, sneezing, sore throat, trouble swallowing and voice change.   Respiratory: Positive for shortness of breath. Negative for apnea, cough, choking, chest tightness and wheezing.   Cardiovascular: Negative for chest pain, palpitations and leg swelling.  Gastrointestinal: Negative for abdominal distention, abdominal pain, constipation, diarrhea and nausea.  Genitourinary: Negative for difficulty urinating, dysuria, frequency and urgency.  Musculoskeletal: Negative for back pain, gait problem and myalgias. Arthralgias: typical arthritis.  Skin: Positive for color change. Negative for pallor, rash and wound.   Neurological: Positive for weakness. Negative for dizziness, tremors, syncope, speech difficulty, numbness and headaches.  Psychiatric/Behavioral: Negative for agitation and behavioral problems. The patient is nervous/anxious.   All other systems reviewed and are negative.   Immunization History  Administered Date(s) Administered  . Influenza Split 01/23/2011, 12/16/2011  . Influenza Whole 12/28/2001, 01/03/2008, 12/11/2009  . Influenza,inj,Quad PF,6+ Mos 12/21/2012, 12/20/2013, 12/18/2014, 12/19/2015, 12/08/2016  . Pneumococcal Conjugate-13 08/10/2014  . Pneumococcal Polysaccharide-23 01/20/2010, 12/05/2015  . Td 04/30/2005, 04/05/2017   Pertinent  Health Maintenance Due  Topic Date Due  . COLONOSCOPY  05/22/2017  . MAMMOGRAM  12/04/2025 (Originally 12/31/2014)  . INFLUENZA VACCINE  10/28/2017  . DEXA SCAN  Completed  . PNA vac Low Risk Adult  Completed   Fall Risk  05/21/2017 04/21/2017 04/05/2017 12/05/2015 01/22/2015  Falls in the past year? Yes Yes Yes No No  Number falls in past yr: 1 1 1  - -  Injury with Fall? Yes Yes Yes - -  Risk Factor Category  High Fall Risk High Fall Risk High Fall Risk - -  Risk for fall due to : History of fall(s);Impaired mobility;Impaired balance/gait History of fall(s);Impaired balance/gait;Impaired mobility History of fall(s);Impaired mobility - -  Follow up Falls evaluation completed;Education provided;Falls prevention discussed Falls evaluation completed;Falls prevention discussed;Education provided Falls evaluation completed;Falls prevention discussed - -   Functional Status Survey:    Vitals:   08/05/17 1520  BP: (!) 150/65  Pulse: 90  Resp: 12  Temp: 98.4 F (36.9 C)  TempSrc: Oral  SpO2: 91%  Weight: 140 lb 8 oz (63.7 kg)  Height: 5\' 7"  (1.702 m)   Body mass index is 22.01 kg/m. Physical Exam  Constitutional: She is oriented to person, place, and time. She appears well-developed and well-nourished. She appears listless. She is  sleeping,  active and cooperative. She does not have a sickly appearance. She appears ill. She appears distressed. Nasal cannula in place.  HENT:  Head: Normocephalic and atraumatic.  Mouth/Throat: Uvula is midline, oropharynx is clear and moist and mucous membranes are normal. Mucous membranes are not pale, not dry and not cyanotic.  Eyes: Pupils are equal, round, and reactive to light. Conjunctivae, EOM and lids are normal.  Neck: Trachea normal, normal range of motion and full passive range of motion without pain. Neck supple. No JVD present. No tracheal deviation, no edema and no erythema present. No thyromegaly present.  Cardiovascular: Normal rate, intact distal pulses and normal pulses. An irregular rhythm present. Exam reveals distant heart sounds. Exam reveals no gallop and no friction rub.  No murmur heard. Pulses:      Dorsalis pedis pulses are 2+ on the right side, and 2+ on the left side.  No edema  Pulmonary/Chest: No accessory muscle usage. Bradypnea noted. She is in respiratory distress. She has decreased breath sounds in the right lower field and the left lower field. She has no wheezes. She has no rhonchi. She has no rales. She exhibits no tenderness.  Abdominal: Soft. Normal appearance and bowel sounds are normal. She exhibits no distension and no ascites. There is no tenderness.  Musculoskeletal: Normal range of motion. She exhibits no edema or tenderness.  Expected osteoarthritis, stiffness; Bilateral Calves soft, supple. Negative Homan's Sign. B- pedal pulses equal; generalized weakness  Neurological: She is oriented to person, place, and time. She has normal strength. She appears listless.  Skin: Skin is warm, dry and intact. She is not diaphoretic. There is cyanosis (mottling on feet). No pallor. Nails show no clubbing.  Psychiatric: She has a normal mood and affect. Her speech is normal. Thought content normal. She is slowed and withdrawn. Cognition and memory are normal. She  expresses inappropriate judgment.  Nursing note and vitals reviewed.   Labs reviewed: Recent Labs    09/25/16 1437  07/21/17 2357 07/22/17 0538 07/23/17 0409 07/30/17 1844  NA 137   < > 135 137  --  135  K 3.5   < > 3.3* 3.7  --  4.7  CL 98*   < > 95* 98*  --  92*  CO2 29   < > 33* 34*  --  35*  GLUCOSE 98   < > 149* 147*  --  137*  BUN 18   < > 17 15  --  44*  CREATININE 1.12*   < > 0.80 0.69  --  1.34*  CALCIUM 10.2   < > 9.2 9.0  --  9.4  MG 2.0  --   --  1.6* 2.3  --   PHOS 3.0  --   --   --   --   --    < > = values in this interval not displayed.   Recent Labs    03/20/17 1837 06/18/17 1018 07/30/17 1844  AST 26 17 24   ALT 16 11 19   ALKPHOS 71 74 59  BILITOT 0.6 0.4 0.6  PROT 7.4 6.9 6.6  ALBUMIN 3.7 3.7 3.4*   Recent Labs    10/26/16 0530  06/18/17 1018  07/21/17 1817 07/21/17 2357 07/30/17 1844  WBC 5.6   < > 8.8   < > 8.8 7.1 8.0  NEUTROABS 3.5  --  6.5  --   --   --  7.4*  HGB 12.5   < > 12.5   < >  12.6 12.7 13.6  HCT 37.6   < > 38.4   < > 38.2 38.0 39.7  MCV 88.4   < > 90.3   < > 89.3 86.8 87.3  PLT 314   < > 280.0   < > 273 228 212   < > = values in this interval not displayed.   Lab Results  Component Value Date   TSH 0.223 (L) 05/27/2013   Lab Results  Component Value Date   HGBA1C 5.2 04/21/2011   Lab Results  Component Value Date   CHOL 125 10/10/2016   HDL 52 10/10/2016   LDLCALC 55 10/10/2016   TRIG 89 10/10/2016   CHOLHDL 2.4 10/10/2016    Significant Diagnostic Results in last 30 days:  Dg Chest 2 View  Result Date: 07/21/2017 CLINICAL DATA:  Shortness of Breath EXAM: CHEST - 2 VIEW COMPARISON:  07/18/2017 FINDINGS: Mild hyperinflation/COPD. Heart is upper limits normal in size. No confluent opacities or effusions. No acute bony abnormality. IMPRESSION: Hyperinflation/COPD.  No active disease. Electronically Signed   By: Rolm Baptise M.D.   On: 07/21/2017 19:03   Dg Chest 2 View  Result Date: 07/18/2017 CLINICAL DATA:   Shortness of breath since this morning. History of COPD. EXAM: CHEST - 2 VIEW COMPARISON:  Chest radiograph September 28, 2016 FINDINGS: Cardiomediastinal silhouette is normal. Calcified aortic arch. No pleural effusions or focal consolidations. Mild chronic interstitial changes increased lung volumes. Stable RIGHT lung base scarring. Trachea projects midline and there is no pneumothorax. Soft tissue planes and included osseous structures are non-suspicious. Calcified LEFT breast implant. Mild scoliosis. IMPRESSION: COPD.  No acute cardiopulmonary process. Aortic Atherosclerosis (ICD10-I70.0). Electronically Signed   By: Elon Alas M.D.   On: 07/18/2017 15:01    Assessment/Plan  Chronic respiratory failure, unspecified whether with hypoxia or hypercapnia (HCC)   Continue OxyFast 5 mg po Q 4 hours scheduled for dyspnea  Continue OxyFast 5-10 mg po Q 1 hour prn dyspnea  Continue Xanax 0.5 mg po QID scheduled for anxiety, dyspnea  Continue O2 2L [place]  Add Scopolamine 1 mg patch- Q 3 days for secretions  Atropine 1%- 2 drops sublingual Q 30 minutes prn secretions  Insert foley for energy preservation  DC Non-essential medications  DNR  F/U on Monday   Family/ staff Communication:   Total Time: 80 minutes  Documentation:  Face to Face: 20 minutes 11:00  Family/Phone: 60 minutes 11:30-13:30   Labs/tests ordered:    Medication list reviewed and assessed for continued appropriateness.  Vikki Ports, NP-C Geriatrics [facility] [phone], [place] [address] Cell Phone (Mon-Fri 8am-5pm):  [phone] On Call:  [phone] & follow prompts after 5pm & weekends Office Phone:  [phone] Office Fax:  [phone]

## 2017-08-09 DIAGNOSIS — Z515 Encounter for palliative care: Secondary | ICD-10-CM | POA: Diagnosis not present

## 2017-08-09 DIAGNOSIS — J449 Chronic obstructive pulmonary disease, unspecified: Secondary | ICD-10-CM | POA: Diagnosis not present

## 2017-08-10 ENCOUNTER — Encounter: Payer: Self-pay | Admitting: Gerontology

## 2017-08-10 ENCOUNTER — Non-Acute Institutional Stay (SKILLED_NURSING_FACILITY): Payer: Medicare Other | Admitting: Gerontology

## 2017-08-10 DIAGNOSIS — Z515 Encounter for palliative care: Secondary | ICD-10-CM

## 2017-08-10 DIAGNOSIS — T2121XD Burn of second degree of chest wall, subsequent encounter: Secondary | ICD-10-CM | POA: Diagnosis not present

## 2017-08-10 DIAGNOSIS — J9610 Chronic respiratory failure, unspecified whether with hypoxia or hypercapnia: Secondary | ICD-10-CM | POA: Diagnosis not present

## 2017-08-10 DIAGNOSIS — F411 Generalized anxiety disorder: Secondary | ICD-10-CM | POA: Diagnosis not present

## 2017-08-10 DIAGNOSIS — J449 Chronic obstructive pulmonary disease, unspecified: Secondary | ICD-10-CM

## 2017-08-10 MED ORDER — OXYCODONE HCL 20 MG/ML PO CONC: 10.0000 mg | ORAL | 0 refills | Status: AC | PRN

## 2017-08-10 MED ORDER — OXYCODONE HCL 20 MG/ML PO CONC: 5.0000 mg | ORAL | 0 refills | Status: DC | PRN

## 2017-08-10 MED ORDER — FENTANYL 12 MCG/HR TD PT72: 12.5000 ug | MEDICATED_PATCH | TRANSDERMAL | Status: AC

## 2017-08-10 MED ORDER — OXYCODONE HCL 20 MG/ML PO CONC: 10.0000 mg | ORAL | 0 refills | Status: AC

## 2017-08-10 MED ORDER — ALPRAZOLAM 0.5 MG PO TABS: 0.5000 mg | ORAL_TABLET | ORAL | 1 refills | Status: AC
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
## 2017-08-13 ENCOUNTER — Encounter: Payer: Self-pay | Admitting: Gerontology

## 2017-08-13 ENCOUNTER — Non-Acute Institutional Stay (SKILLED_NURSING_FACILITY): Payer: Medicare Other | Admitting: Gerontology

## 2017-08-13 DIAGNOSIS — Z515 Encounter for palliative care: Secondary | ICD-10-CM | POA: Diagnosis not present

## 2017-08-13 DIAGNOSIS — J449 Chronic obstructive pulmonary disease, unspecified: Secondary | ICD-10-CM | POA: Diagnosis not present

## 2017-08-13 DIAGNOSIS — J9610 Chronic respiratory failure, unspecified whether with hypoxia or hypercapnia: Secondary | ICD-10-CM | POA: Diagnosis not present

## 2017-08-25 IMAGING — CT CT HEAD W/O CM
3 series · 15 of 47 positions shown, 18 images · non-contrast
Comparison: 08/02/2009

CLINICAL DATA: Dizziness and nausea.

EXAM:
CT HEAD WITHOUT CONTRAST
TECHNIQUE: Contiguous axial images were obtained from the base of the skull
through the vertex without intravenous contrast.

[Series 2: head wo · axial · 0.44mm/px · z∈[-57,+68]mm · 9 of 30 slices shown, 12 images]
[im 3/30  brain]
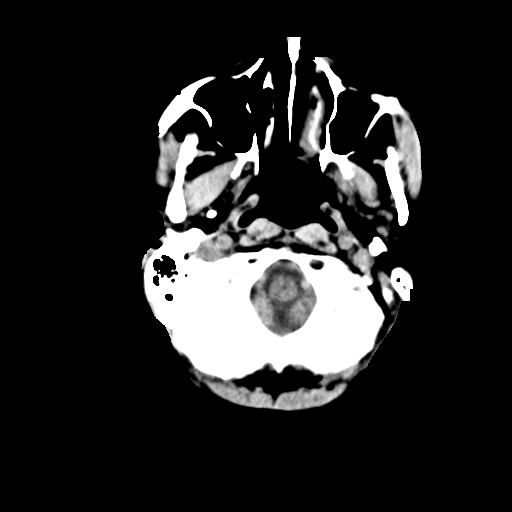
[im 3/30  bone]
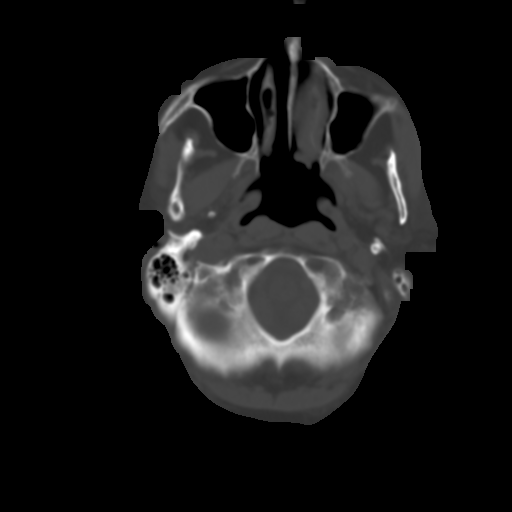
[im 6/30  brain]
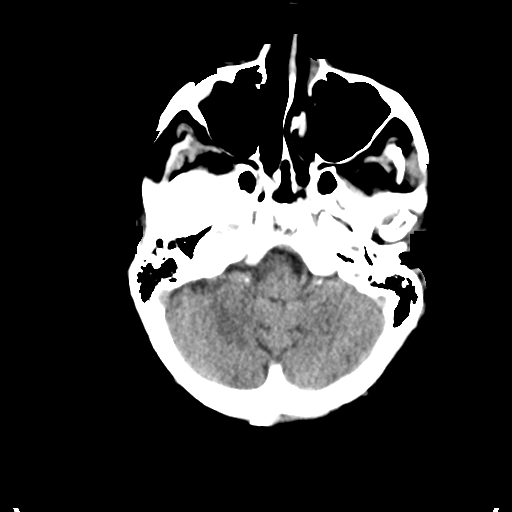
[im 9/30  brain]
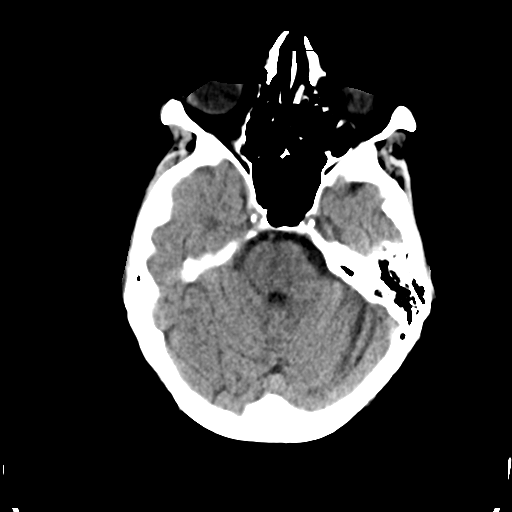
[im 12/30  brain]
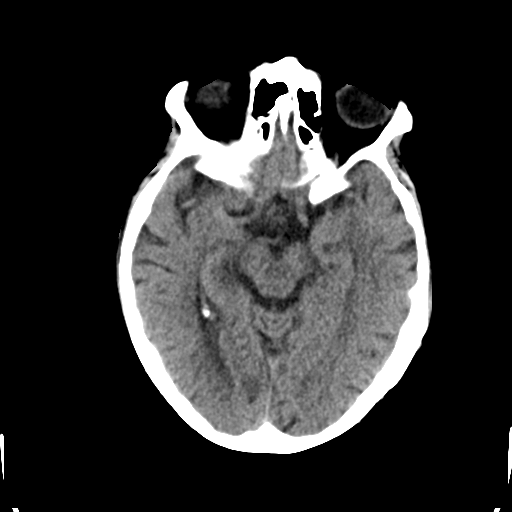
[im 16/30  brain]
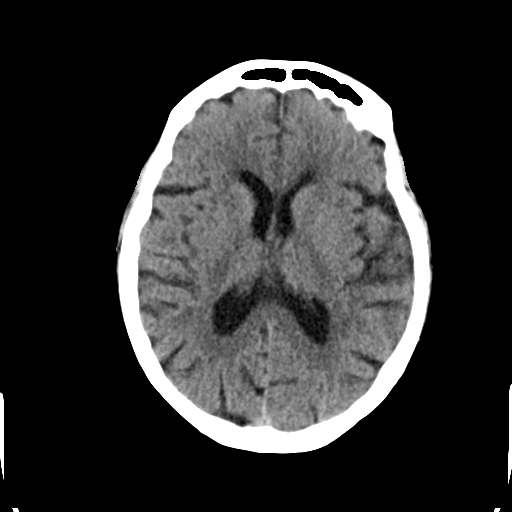
[im 16/30  bone]
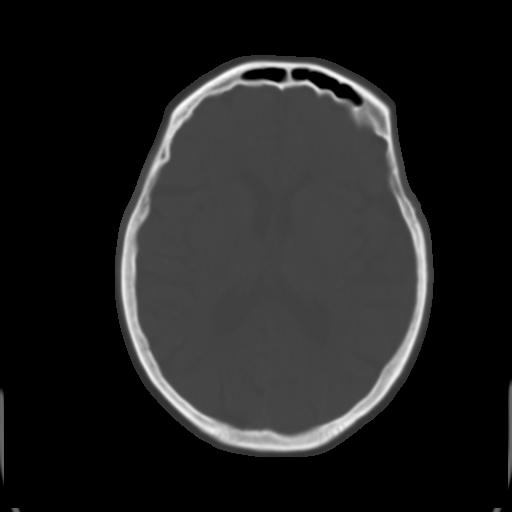
[im 19/30  brain]
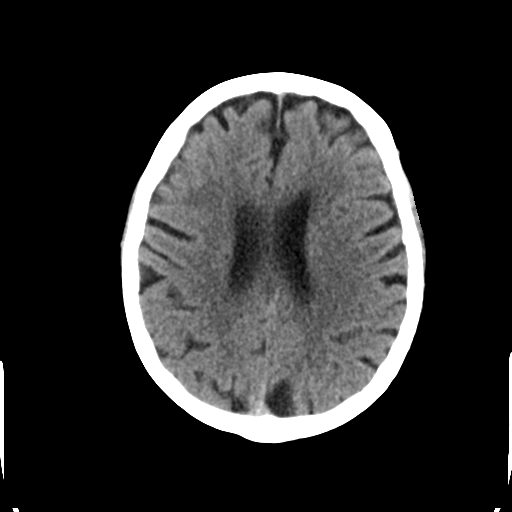
[im 22/30  brain]
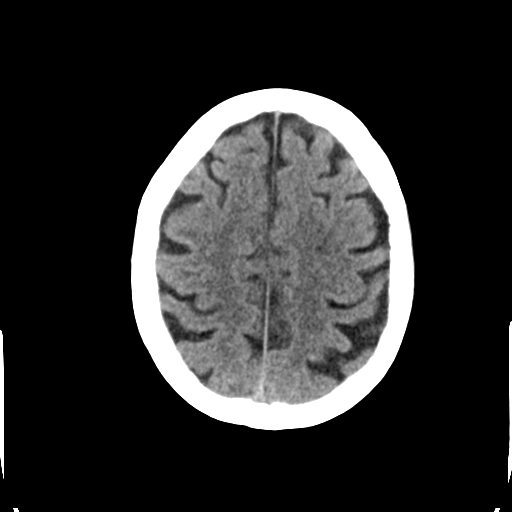
[im 25/30  brain]
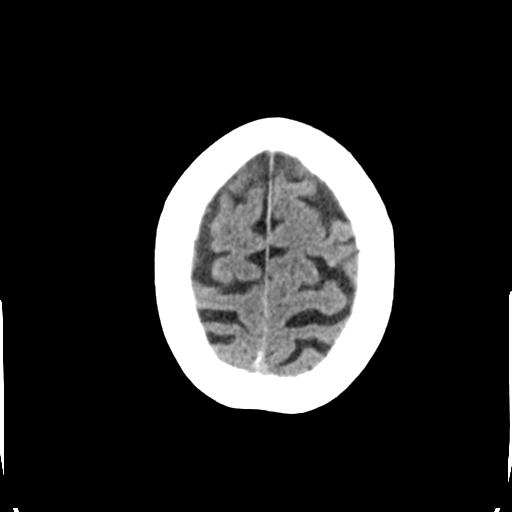
[im 28/30  brain]
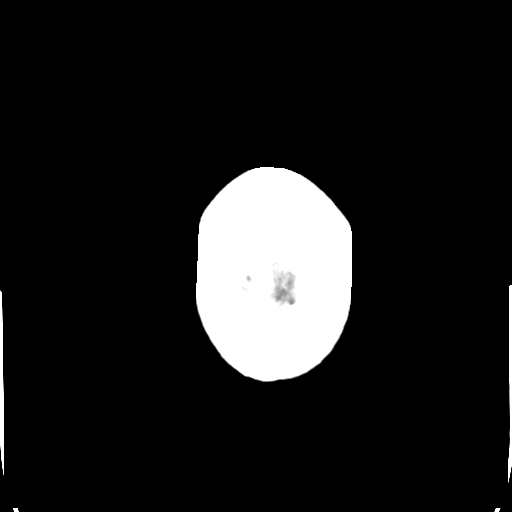
[im 28/30  bone]
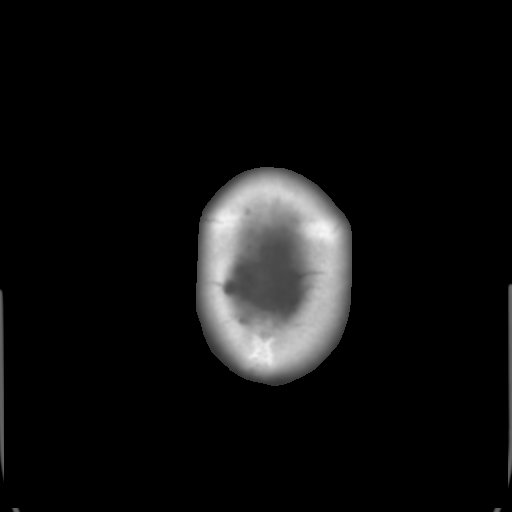

[Series 4: coronal soft tissue · coronal · 0.31mm/px · 3 of 60 slices shown]
[im 20/60  brain]
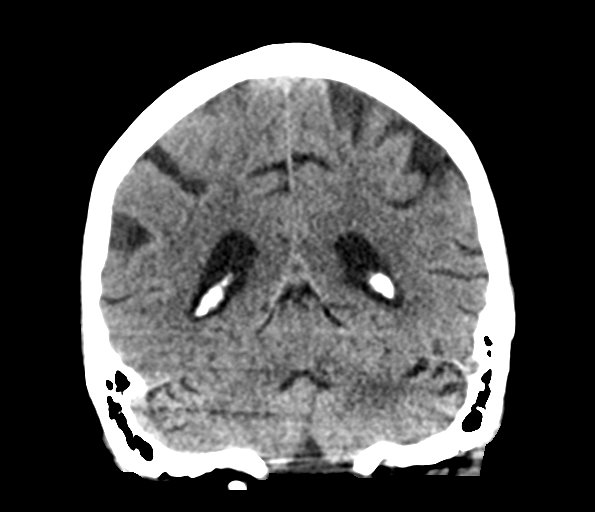
[im 27/60  brain]
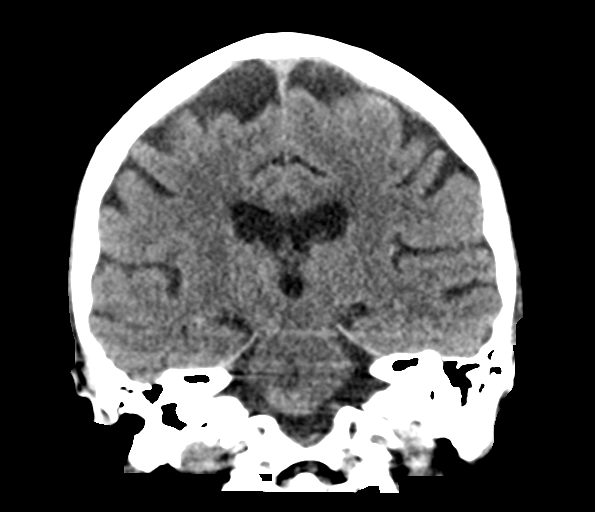
[im 33/60  brain]
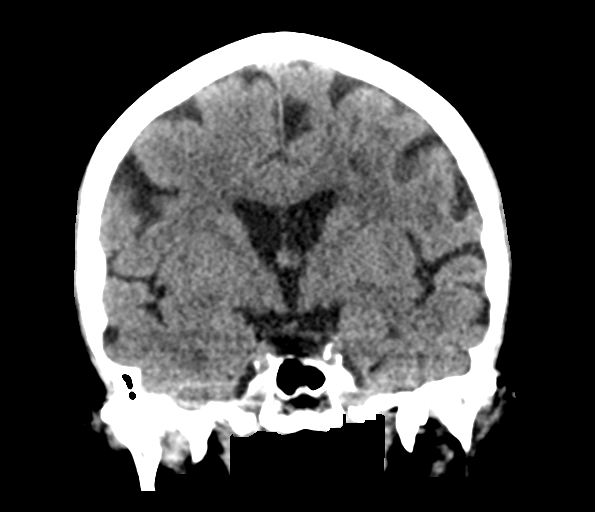

[Series 5: sagittal soft tissue · sagittal · 0.30mm/px · 3 of 47 slices shown]
[im 16/47  brain]
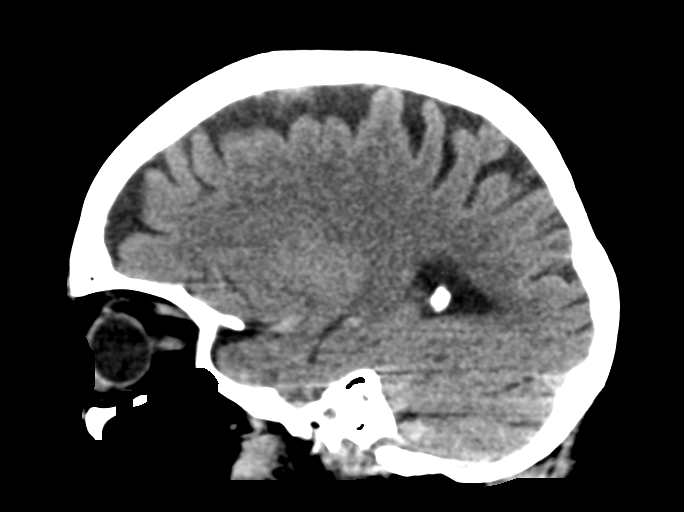
[im 24/47  brain]
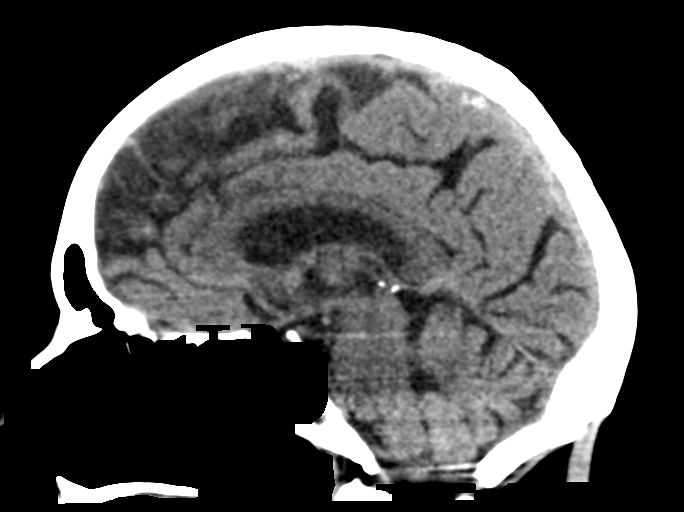
[im 31/47  brain]
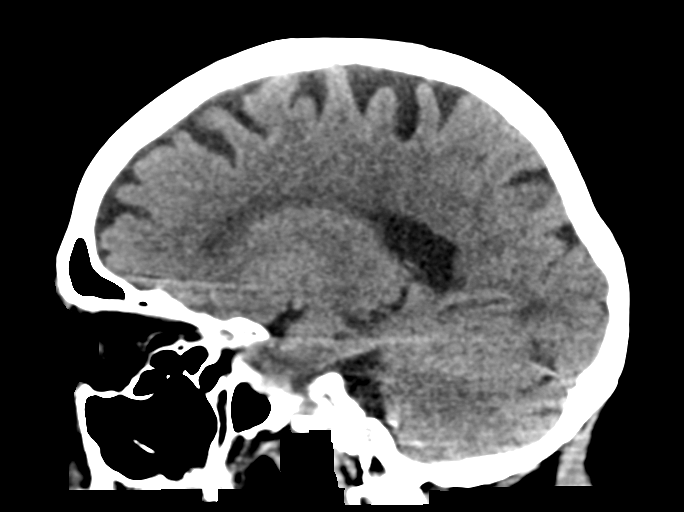

[15 of 47 positions shown; findings below may reference images not displayed]

FINDINGS: Brain: There is no evidence of acute cortical infarct, intracranial
hemorrhage, mass, midline shift, or extra-axial fluid collection.
Mild cerebral atrophy is unchanged. Periventricular white matter
hypodensities are nonspecific but compatible with mild chronic small
vessel ischemic disease.

Vascular: Calcified atherosclerosis at the skullbase. No hyperdense
vessel.

Skull: No fracture or focal osseous lesion.

Sinuses/Orbits: No significant sinus disease. Prior bilateral
cataract extraction.

Other: None.
IMPRESSION: 1. No evidence of acute intracranial abnormality.
2. Mild chronic small vessel ischemic disease.

## 2017-08-25 IMAGING — CR DG CHEST 2V
2 series · 2 of 2 positions shown · non-contrast
Comparison: 02/03/2016 chest radiograph.

CLINICAL DATA: 75 y/o F; weakness and shortness of breath, and
wheezing.

EXAM:
CHEST  2 VIEW

[chest lat]
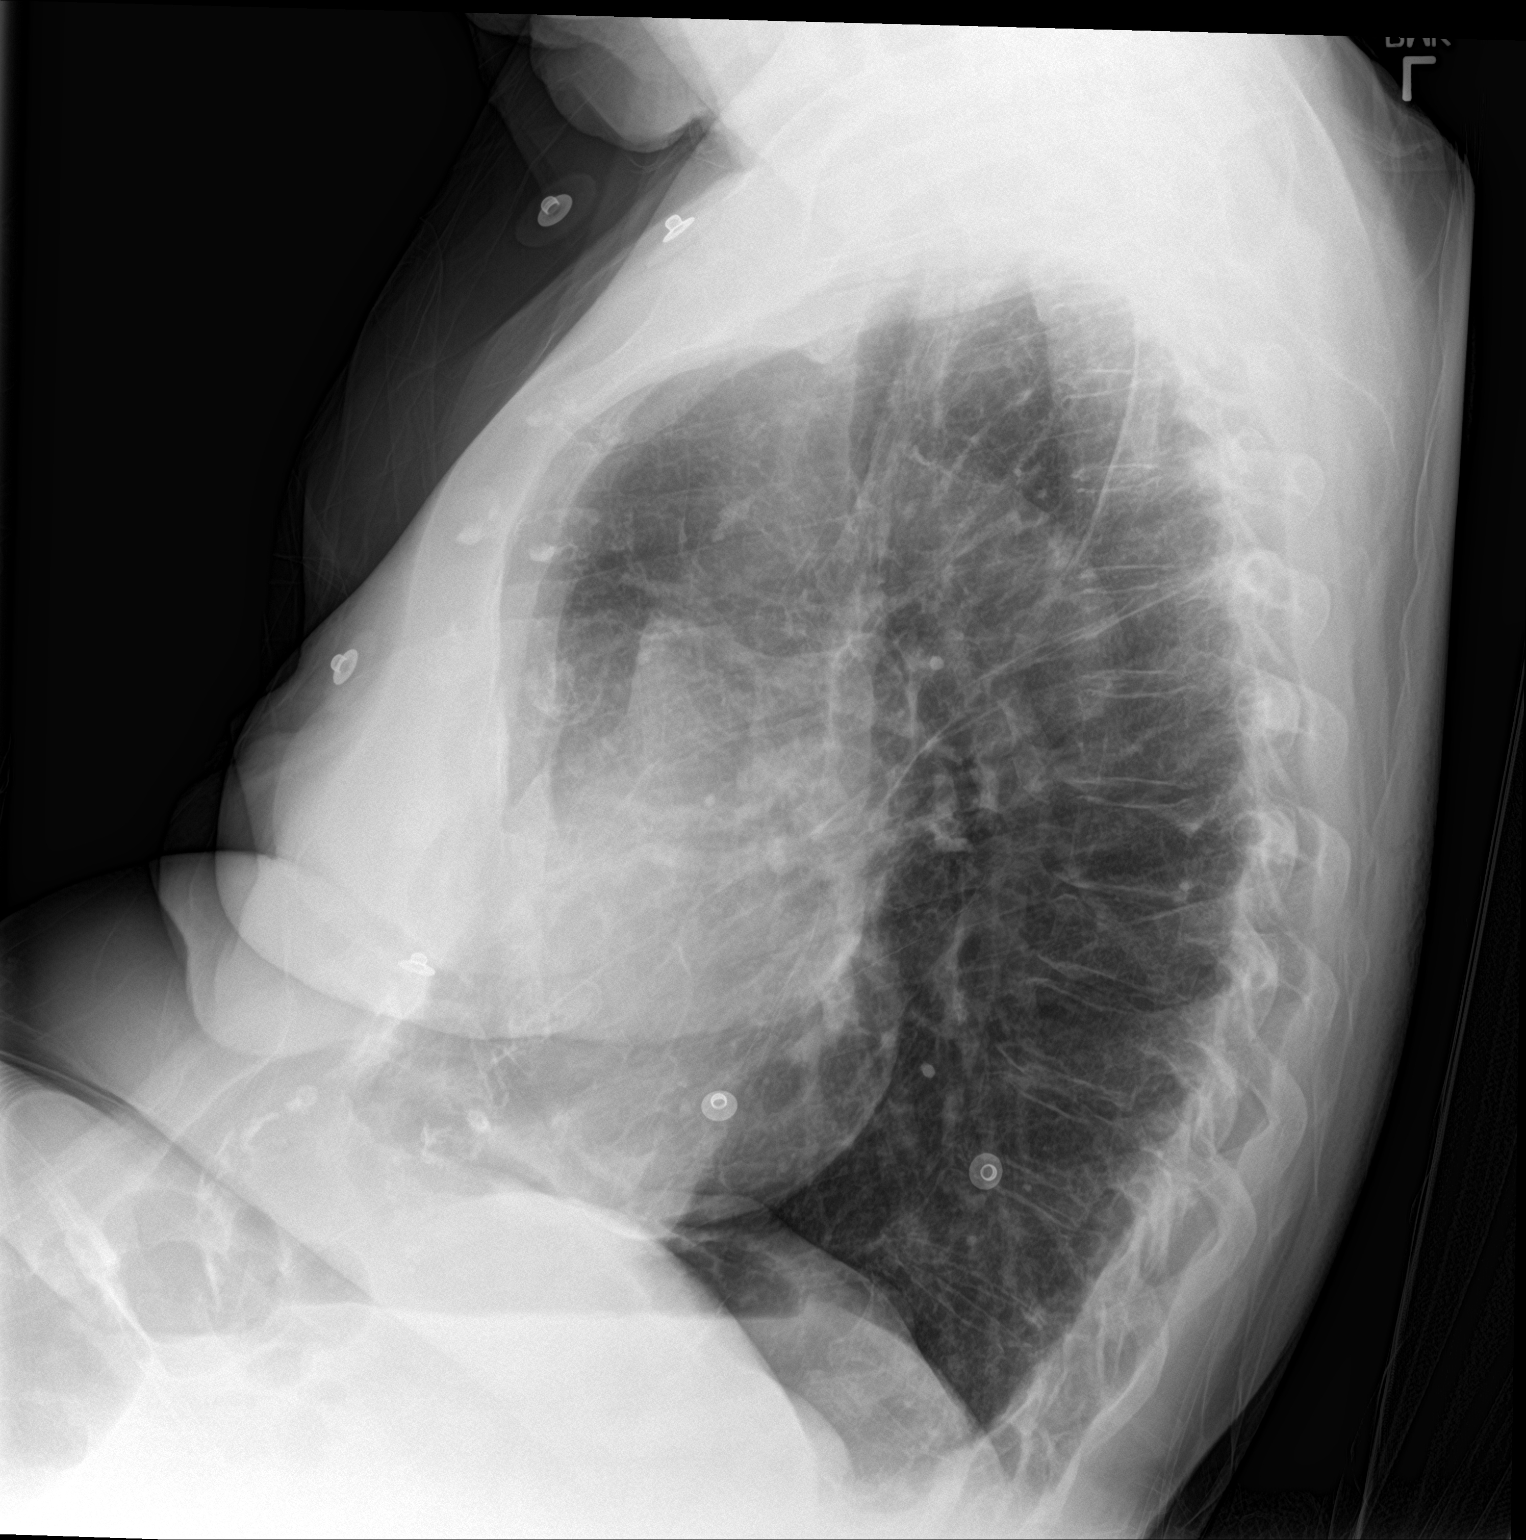

[chest ap]
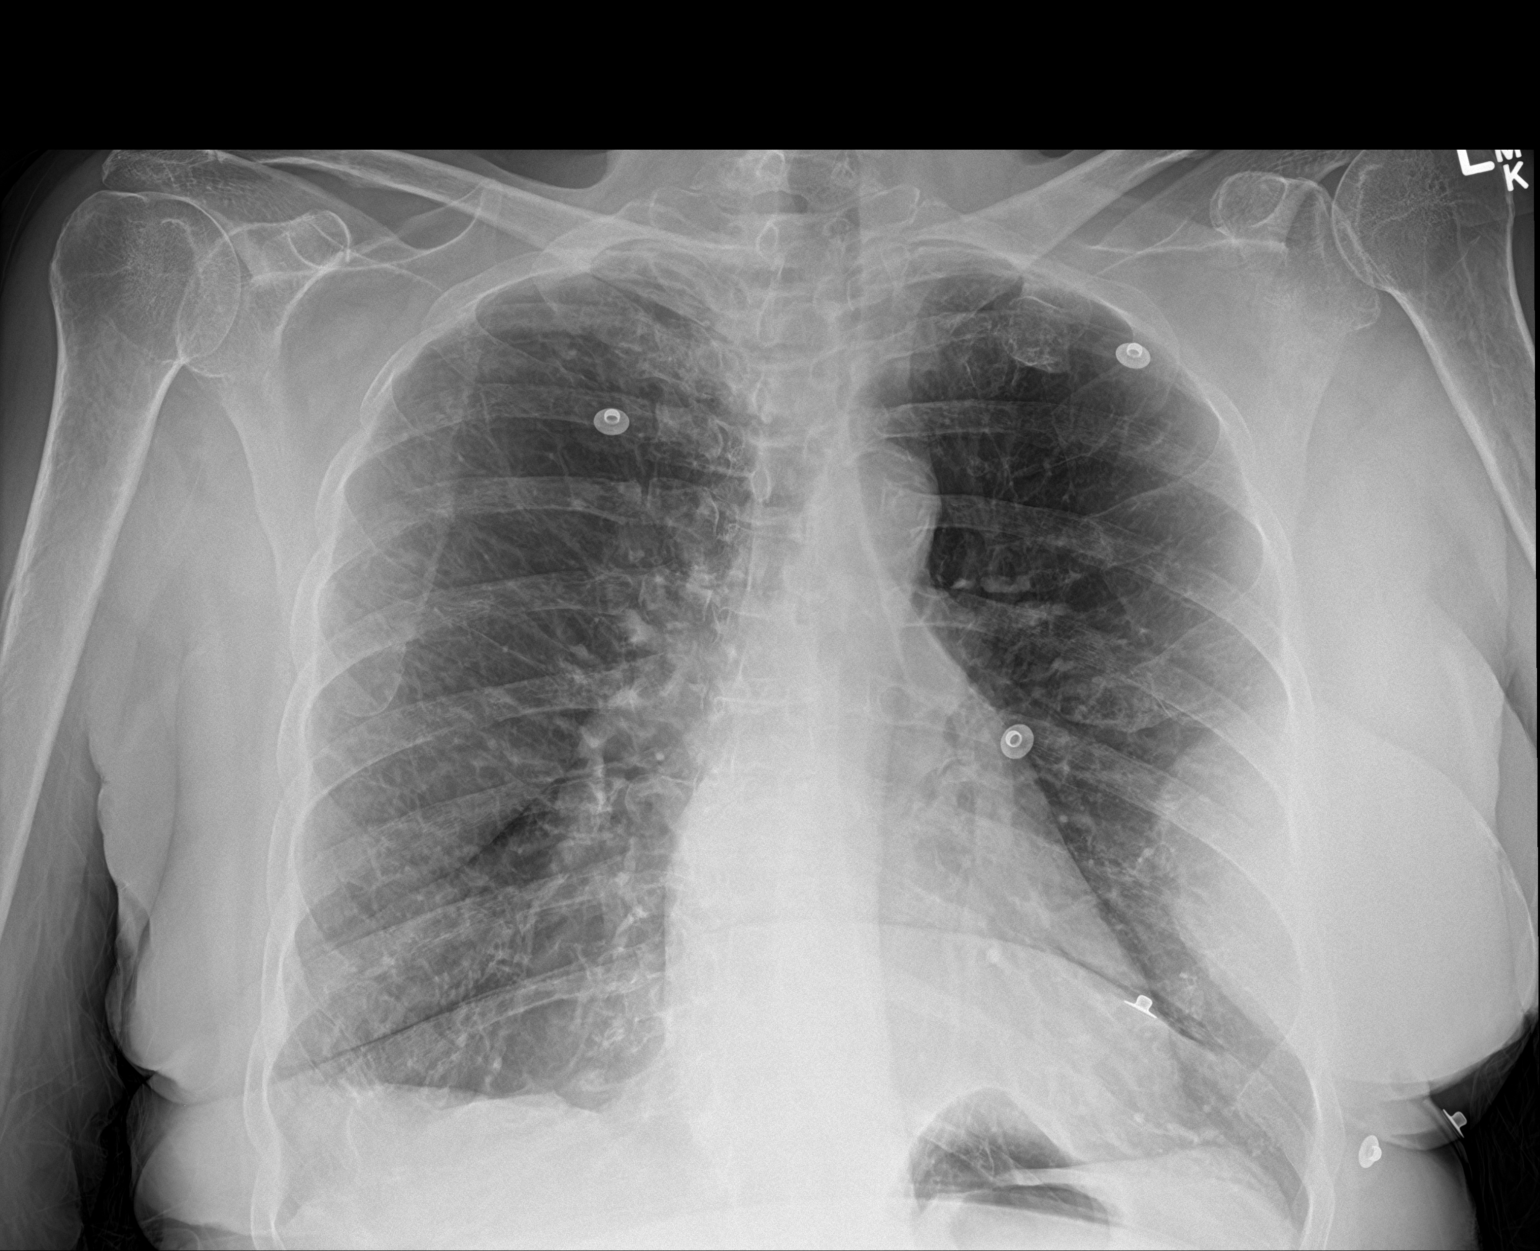

[2 of 2 positions shown; findings below may reference images not displayed]

FINDINGS: Stable cardiac silhouette within normal limits given projection and
technique. Aortic atherosclerosis with calcification. Emphysema with
upper lobe predominance. Bronchitic changes in the lung bases. No
focal consolidation. No pleural effusion. No pneumothorax.
Demineralized bones. No acute osseous abnormality is identified.
IMPRESSION: Emphysema. Aortic atherosclerosis. Lung base bronchitic changes. No
focal consolidation.

By: Arziko Koka M.D.

## 2017-08-28 NOTE — Progress Notes (Signed)
Location:   [facility] Room Number: [place] of Service:  SNF [phone]) Provider:  Toni Arthurs, NP-C  Tonia Ghent, MD  Patient Care Team: Tonia Ghent, MD as PCP - General (Family Medicine) Grace Isaac, MD as Consulting Physician (Cardiothoracic Surgery) Nestor Lewandowsky, MD as Referring Physician (Thoracic Diseases) Erby Pian, MD as Referring Physician Erby Pian, MD as Referring Physician Pleasant, Eppie Gibson, RN as [place] Management  Extended Emergency Contact Information Primary Emergency Contact: Raden,Richard Address: [address]          [place], [place] [address] Johnnette Litter of [place] Phone: [phone] Relation: Spouse Secondary Emergency Contact: Geralyn Corwin Address: [address]          [place], [place] [address] Johnnette Litter of [place] Phone: [phone] Mobile Phone: [phone] Relation: Sister  Code Status:  DNR Goals of care: Advanced Directive information Advanced Directives 08-29-2017  Does Patient Have a Medical Advance Directive? Yes  Type of Advance Directive Out of facility DNR (pink MOST or yellow form)  Does patient want to make changes to medical advance directive? No - Patient declined  Copy of [place] in Chart? -  Would patient like information on creating a medical advance directive? -  Pre-existing out of facility DNR order (yellow form or pink MOST form) -     Chief Complaint  Patient presents with  . Medical Management of Chronic Issues    Routine Visit    HPI:  Pt is a 77 y.o. female seen today for medical management of chronic diseases. Pt was initially admitted to the facility for rehab following hospitalization for COPD exacerbation/ Respiratory Failure. Pt has had a rapid decline since admission. She was being followed by Palliative Care for symptom management. Pt and husband initially was resistant to interventions for dyspnea  and in denial about prognosis. On Monday, pt finally acknowledged to the Palliative Care NP that she realizes she is dying. She verbalized that she was at peace with dying. Pt was opened to Hospice services on Tuesday after completing the course of therapy to the most extent possible. She began having worsening dyspnea last night that did not seem to respond as well to the OxyFast as it had been. Nursing contacted Hospice and asked for prn roxanol. Pt has since been more comfortable and experiencing less dyspnea. She also has a 12 mcg Fentanyl patch in place as well as a Scopolamine patch for secretions. She is getting scheduled OxyFast and Scheduled Xanax for anxiety/agitation/restlessness and dyspnea. Pt had been picking at the air and trying to get out of bed. On exam now, pt appears comfortable. Breathing easily. O2 3L [place] in place. She is now unresponsive. B- feet with dark mottling. Feet and legs cool up to the knees. Fingers with cyanosis. Minimal secretions ausculted. Pt appears relaxed. ++family in the room. All appear to be grieving appropriately.      Past Medical History:  Diagnosis Date  . Anginal pain [facility])    see dr [name]  "[place]"  . Anxiety   . Arthritis   . Cancer (HCC)    side of head- skin cancer, squamous cell ca on left foot.  . Chronic airway obstruction (HCC)    chronic airway obstruction not elsewhere classified, unspecified  . Chronic obstructive asthma ([place])    unspecified  . Complication of anesthesia   . Constipation   . COPD (chronic obstructive pulmonary disease) ([place])   .  COPD with emphysema ([place]) 01/05/2013  . COPD, severe 07/29/98  . Coronary artery disease 01/22/99   Non Q-wave MI, stent RCA  . Coronary atherosclerosis of native coronary artery 01/22/1999   status post stent RCA,   . Depression   . Dysrhythmia    Palpations at times. last time 10/29 ish 2014  . Esophageal dilatation    Pt needs appple sauce to take meds.  . Esophageal stricture      PT needs apple sauce to take meds  . GERD (gastroesophageal reflux disease) 08/29/98   severe-no meds now  . Head injury, acute, with loss of consciousness ([place]) 11/2012   unsure how long  . History of blood transfusion   . History of blurry vision 05/06-05/10/2009   Hospital [facility] CP R/O'D Blurry vision, smoking  . History of CT scan of head 08/02/09   w/o mild age appropr atrophy  . History of ETT 08/03/09   Myoview Normal  . History of ETT 05/1999   Cardiolite pos ETT, neg Cardiolite  . History of ETT 09/02/01   Normal  . History of ETT 04/28/2006   Myoview normal EF 83%  . History of ETT 04/10/11   normal EF and no ischemia per Dr. Saralyn Pilar  . History of kidney stones 03/2017  . History of pneumothorax 01/05/2013  . Hx of cardiac catheterization 08/29/01   60% RCA 0/w 20-30% lesions  . Hx of cardiac catheterization 01/22/99   w/Stent 90% RCA lesion  . Hyperlipidemia, unspecified   . Hypertension    03/30/00  . Mental disorder   . On home oxygen therapy    as needed-has a travel tank  . Pneumothorax 2/14  . PONV (postoperative nausea and vomiting)    ad breast remocved and see was under anesthesia a long time  . Shortness of breath   . Status post aortic coarctation stent placement   . Tobacco abuse    1/4 pack daily   Past Surgical History:  Procedure Laterality Date  . BACK SURGERY    . BREAST SURGERY     implant removal, R breast  . CARDIAC CATHETERIZATION    . CAROTID STENT Right 2000  . CATARACT EXTRACTION Bilateral    2016  . COLONOSCOPY    . CYSTOSCOPY W/ RETROGRADES Bilateral 04/28/2017   Procedure: CYSTOSCOPY WITH RETROGRADE PYELOGRAM;  Surgeon: Hollice Espy, MD;  Location: [facility] ORS;  Service: Urology;  Laterality: Bilateral;  . CYSTOSCOPY/URETEROSCOPY/HOLMIUM LASER/STENT PLACEMENT Left 04/28/2017   Procedure: CYSTOSCOPY/URETEROSCOPY/HOLMIUM LASER/STENT PLACEMENT;  Surgeon: Hollice Espy, MD;  Location: [facility] ORS;  Service: Urology;  Laterality: Left;   . ESOPHAGEAL DILATION  06/00   EGD  . LAMINECTOMY  1976   Disc Removal X 2 Lumbar  . MASTECTOMY  03/1976   Bilateral due FCBD with implants [facility])  . MASTECTOMY Left    due to fibrocystic breast disease  . OOPHORECTOMY    . TISSUE EXPANDER PLACEMENT Right 02/03/2013   Procedure: REMOVAL OF RIGHT BREAST IMPLANT AND IMPLANT MATERIAL  CAPSULECTOMY;  Surgeon: Irene Limbo, MD;  Location: [facility];  Service: Plastics;  Laterality: Right;  . TOTAL ABDOMINAL HYSTERECTOMY W/ BILATERAL SALPINGOOPHORECTOMY      Allergies  Allergen Reactions  . Amoxicillin Shortness Of Breath    Has patient had a PCN reaction causing immediate rash, facial/tongue/throat swelling, SOB or lightheadedness with hypotension: Yes Has patient had a PCN reaction causing severe rash involving mucus membranes or skin necrosis: No Has patient had a PCN reaction that required hospitalization:  No Has patient had a PCN reaction occurring within the last 10 years: No If all of the above answers are "NO", then may proceed with Cephalosporin use.   Marland Kitchen Doxycycline Nausea Only and Other (See Comments)    Dizziness   . Ibuprofen Nausea And Vomiting  . Sertraline Hcl Nausea And Vomiting    REACTION: trembling    Allergies as of 08-Sep-2017      Reactions   Amoxicillin Shortness Of Breath   Has patient had a PCN reaction causing immediate rash, facial/tongue/throat swelling, SOB or lightheadedness with hypotension: Yes Has patient had a PCN reaction causing severe rash involving mucus membranes or skin necrosis: No Has patient had a PCN reaction that required hospitalization: No Has patient had a PCN reaction occurring within the last 10 years: No If all of the above answers are "NO", then may proceed with Cephalosporin use.   Doxycycline Nausea Only, Other (See Comments)   Dizziness    Ibuprofen Nausea And Vomiting   Sertraline Hcl Nausea And Vomiting   REACTION: trembling      Medication List        Accurate as of  2017-09-08  2:47 PM. Always use your most recent med list.          acetaminophen 650 MG suppository Commonly known as:  TYLENOL Place 650 mg rectally every 4 (four) hours as needed.   ALPRAZolam 0.5 MG tablet Commonly known as:  XANAX Take 0.5 mg by mouth every 4 (four) hours as needed. anxiety, agitation, restlessness, dyspnea   ALPRAZolam 0.5 MG tablet Commonly known as:  XANAX Take 1 tablet (0.5 mg total) by mouth every 4 (four) hours.   atropine 1 % ophthalmic solution Place 2 drops under the tongue every 30 (thirty) minutes as needed.   bisacodyl 10 MG suppository Commonly known as:  DULCOLAX Place 10 mg rectally daily as needed.   fentaNYL 12 MCG/HR Commonly known as:  DURAGESIC - dosed mcg/hr Place 12.5 mcg onto the skin every 3 (three) days.   ipratropium-albuterol 0.5-2.5 (3) MG/3ML Soln Commonly known as:  DUONEB Take 3 mLs by nebulization 3 (three) times daily.   ipratropium-albuterol 0.5-2.5 (3) MG/3ML Soln Commonly known as:  DUONEB Take 3 mLs by nebulization every 4 (four) hours as needed (shortness of breath).   magnesium hydroxide 400 MG/5ML suspension Commonly known as:  MILK OF MAGNESIA Take 30 mLs by mouth daily.   nitroGLYCERIN 0.4 MG SL tablet Commonly known as:  NITROSTAT Place 1 tablet (0.4 mg total) under the tongue every 5 (five) minutes as needed for chest pain (max 3 doses in 15 min).   oxyCODONE 20 MG/ML concentrated solution Commonly known as:  ROXICODONE INTENSOL Take 0.5 mLs (10 mg total) by mouth every 4 (four) hours.   oxyCODONE 20 MG/ML concentrated solution Commonly known as:  ROXICODONE INTENSOL Take 0.5-1 mLs (10-20 mg total) by mouth every hour as needed.   OXYGEN Inhale 2-3 L into the lungs continuous.   scopolamine 1 MG/3DAYS Commonly known as:  TRANSDERM-SCOP Place 1 patch onto the skin every 3 (three) days.   silver sulfADIAZINE 1 % cream Commonly known as:  SILVADENE Apply 1 application topically. Every shift,   apply to burn areas on chest until resolved   sodium chloride 0.65 % Soln nasal spray Commonly known as:  OCEAN Place 2 sprays into both nostrils as needed for congestion. dry sinuses, irritation. May keep at bedside.       Review of Systems  Unable  to perform ROS: Patient unresponsive    Immunization History  Administered Date(s) Administered  . Influenza Split 01/23/2011, 12/16/2011  . Influenza Whole 12/28/2001, 01/03/2008, 12/11/2009  . Influenza,inj,Quad PF,6+ Mos 12/21/2012, 12/20/2013, 12/18/2014, 12/19/2015, 12/08/2016  . Pneumococcal Conjugate-13 08/10/2014  . Pneumococcal Polysaccharide-23 01/20/2010, 12/05/2015  . Td 04/30/2005, 04/05/2017   Pertinent  Health Maintenance Due  Topic Date Due  . COLONOSCOPY  05/22/2017  . MAMMOGRAM  12/04/2025 (Originally 12/31/2014)  . INFLUENZA VACCINE  10/28/2017  . DEXA SCAN  Completed  . PNA vac Low Risk Adult  Completed   Fall Risk  05/21/2017 04/21/2017 04/05/2017 12/05/2015 01/22/2015  Falls in the past year? Yes Yes Yes No No  Number falls in past yr: 1 1 1  - -  Injury with Fall? Yes Yes Yes - -  Risk Factor Category  High Fall Risk High Fall Risk High Fall Risk - -  Risk for fall due to : History of fall(s);Impaired mobility;Impaired balance/gait History of fall(s);Impaired balance/gait;Impaired mobility History of fall(s);Impaired mobility - -  Follow up Falls evaluation completed;Education provided;Falls prevention discussed Falls evaluation completed;Falls prevention discussed;Education provided Falls evaluation completed;Falls prevention discussed - -   Functional Status Survey:    Vitals:   2017/08/16 1427  BP: 139/71  Pulse: 82  Resp: 20  Temp: 97.9 F (36.6 C)  TempSrc: Oral  SpO2: 91%  Weight: 140 lb 8 oz (63.7 kg)  Height: 5\' 7"  (1.702 m)   Body mass index is 22.01 kg/m. Physical Exam  Constitutional: She appears well-developed and well-nourished. She does not appear ill. No distress. Nasal cannula in place.    HENT:  Head: Normocephalic and atraumatic.  Mouth/Throat: Uvula is midline, oropharynx is clear and moist and mucous membranes are normal. Mucous membranes are not pale, not dry and not cyanotic.  Eyes: Conjunctivae and lids are normal.  Neck: Trachea normal and full passive range of motion without pain. No JVD present. No tracheal deviation, no edema and no erythema present. No thyromegaly present.  Cardiovascular: An irregular rhythm present. Tachycardia present. Exam reveals distant heart sounds and decreased pulses. Exam reveals no gallop and no friction rub.  No murmur heard. Pulses:      Dorsalis pedis pulses are 2+ on the right side, and 2+ on the left side.  No edema; B-feet mottling, legs cool from toes to knees  Pulmonary/Chest: Effort normal. No accessory muscle usage. No respiratory distress. She has decreased breath sounds in the right middle field, the right lower field, the left middle field and the left lower field. She has no wheezes. She has no rhonchi. She has no rales. She exhibits no tenderness.  Abdominal: Soft. Normal appearance. She exhibits no distension and no ascites. Bowel sounds are absent. There is no tenderness.  Musculoskeletal: Normal range of motion. She exhibits no edema or tenderness.  Expected osteoarthritis, stiffness; Bilateral Calves soft, supple. Negative Homan's Sign. B- pedal pulses equal  Neurological: She is unresponsive.  Skin: Skin is warm, dry and intact. She is not diaphoretic. There is cyanosis (fingers). No pallor. Nails show no clubbing.  B-feet mottling  Psychiatric: She has a normal mood and affect. Her speech is normal and behavior is normal. Judgment and thought content normal. Cognition and memory are normal.  Nursing note and vitals reviewed.   Labs reviewed: Recent Labs    09/25/16 1437  07/21/17 2357 07/22/17 0538 07/23/17 0409 07/30/17 1844  NA 137   < > 135 137  --  135  K 3.5   < >  3.3* 3.7  --  4.7  CL 98*   < > 95* 98*   --  92*  CO2 29   < > 33* 34*  --  35*  GLUCOSE 98   < > 149* 147*  --  137*  BUN 18   < > 17 15  --  44*  CREATININE 1.12*   < > 0.80 0.69  --  1.34*  CALCIUM 10.2   < > 9.2 9.0  --  9.4  MG 2.0  --   --  1.6* 2.3  --   PHOS 3.0  --   --   --   --   --    < > = values in this interval not displayed.   Recent Labs    03/20/17 1837 06/18/17 1018 07/30/17 1844  AST 26 17 24   ALT 16 11 19   ALKPHOS 71 74 59  BILITOT 0.6 0.4 0.6  PROT 7.4 6.9 6.6  ALBUMIN 3.7 3.7 3.4*   Recent Labs    10/26/16 0530  06/18/17 1018  07/21/17 1817 07/21/17 2357 07/30/17 1844  WBC 5.6   < > 8.8   < > 8.8 7.1 8.0  NEUTROABS 3.5  --  6.5  --   --   --  7.4*  HGB 12.5   < > 12.5   < > 12.6 12.7 13.6  HCT 37.6   < > 38.4   < > 38.2 38.0 39.7  MCV 88.4   < > 90.3   < > 89.3 86.8 87.3  PLT 314   < > 280.0   < > 273 228 212   < > = values in this interval not displayed.   Lab Results  Component Value Date   TSH 0.223 (L) 05/27/2013   Lab Results  Component Value Date   HGBA1C 5.2 04/21/2011   Lab Results  Component Value Date   CHOL 125 10/10/2016   HDL 52 10/10/2016   LDLCALC 55 10/10/2016   TRIG 89 10/10/2016   CHOLHDL 2.4 10/10/2016    Significant Diagnostic Results in last 30 days:  Dg Chest 2 View  Result Date: 07/21/2017 CLINICAL DATA:  Shortness of Breath EXAM: CHEST - 2 VIEW COMPARISON:  07/18/2017 FINDINGS: Mild hyperinflation/COPD. Heart is upper limits normal in size. No confluent opacities or effusions. No acute bony abnormality. IMPRESSION: Hyperinflation/COPD.  No active disease. Electronically Signed   By: Rolm Baptise M.D.   On: 07/21/2017 19:03   Dg Chest 2 View  Result Date: 07/18/2017 CLINICAL DATA:  Shortness of breath since this morning. History of COPD. EXAM: CHEST - 2 VIEW COMPARISON:  Chest radiograph September 28, 2016 FINDINGS: Cardiomediastinal silhouette is normal. Calcified aortic arch. No pleural effusions or focal consolidations. Mild chronic interstitial changes  increased lung volumes. Stable RIGHT lung base scarring. Trachea projects midline and there is no pneumothorax. Soft tissue planes and included osseous structures are non-suspicious. Calcified LEFT breast implant. Mild scoliosis. IMPRESSION: COPD.  No acute cardiopulmonary process. Aortic Atherosclerosis (ICD10-I70.0). Electronically Signed   By: Elon Alas M.D.   On: 07/18/2017 15:01    Assessment/Plan  Chronic obstructive pulmonary disease, unspecified COPD type ([place])  Chronic respiratory failure, unspecified whether with hypoxia or hypercapnia ([place])  Encounter for dying care   Pt is actively dying  Continue O2 2-5L [place]- titrate for comfort  NPO  Continue foley for comfort  Continue Scopolamine patch for secretions  Atropine 1% drops- 2 drops under the tongue Q 30  minutes prn secretions  Continue Fentanyl 12 mcg patch TD Q 3 days  Continue OxyFast 10 mg po Q 4 hours ATC  Continue OxyFast 10-20 mg po Q 1 hour prn  Continue Xanax 0.5 mg po Q 4 hours ATC  Continue Xanax 0.5 mg po Q 4 hours prn  Tylenol 650 mg suppository Q 4 hours prn  Duonebs 3 mL TID scheduled  Duonebs Q 4 hours prn  Emotional, educational and spiritual support for family prn  Nurse may pronounce death  Aug 30, 2022 release body to funeral home of choice  Family/ staff Communication:   Total Time:  Documentation:  Face to Face:  Family/Phone:   Labs/tests ordered:    Medication list reviewed and assessed for continued appropriateness. Monthly medication orders reviewed and signed.  Vikki Ports, NP-C Geriatrics [facility] [phone], [place] [address] Cell Phone (Mon-Fri 8am-5pm):  [phone] On Call:  [phone] & follow prompts after 5pm & weekends Office Phone:  [phone] Office Fax:  [phone]

## 2017-08-28 DEATH — deceased

## 2017-09-26 IMAGING — CT CT CHEST W/O CM
1 series · 15 of 34 positions shown, 19 images · non-contrast
Comparison: 12/24/2015

CLINICAL DATA: Worsening shortness of breath over the past 3-4
weeks. History of emphysema.

EXAM:
CT CHEST WITHOUT CONTRAST
TECHNIQUE: Multidetector CT imaging of the chest was performed following the
standard protocol without IV contrast.

[Series 2: thorax · axial · 0.73mm/px · z∈[-555,-291]mm · 15 of 156 slices shown, 19 images]
[im 12/156  mediastinal]
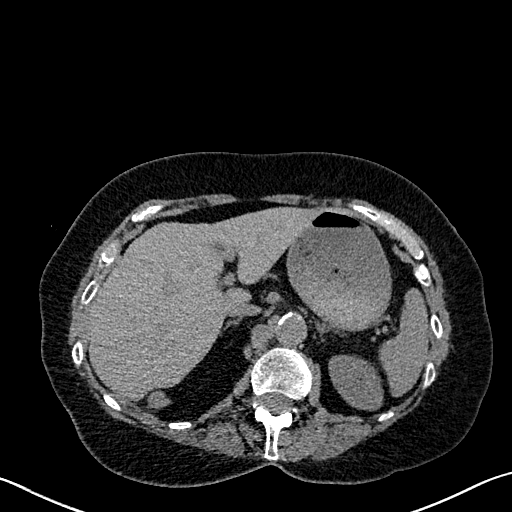
[im 12/156  lung]
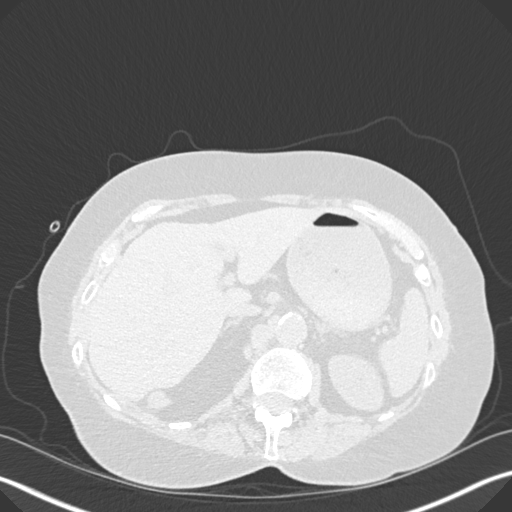
[im 23/156  lung]
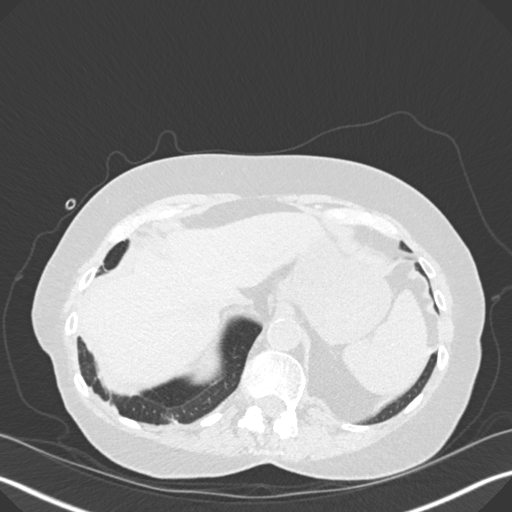
[im 32/156  lung]
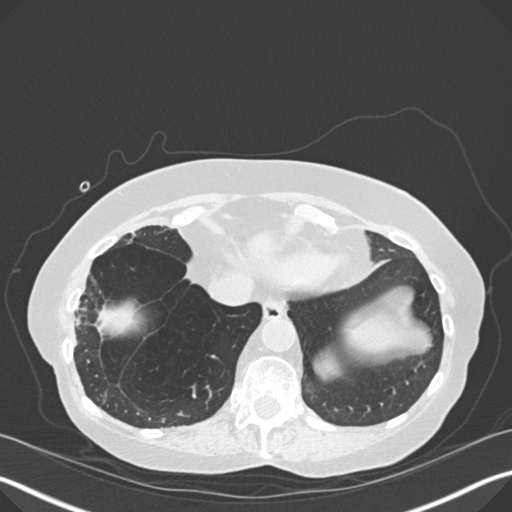
[im 41/156  lung]
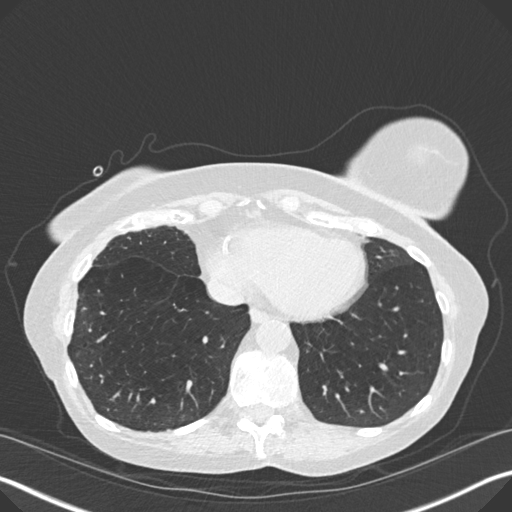
[im 52/156  mediastinal]
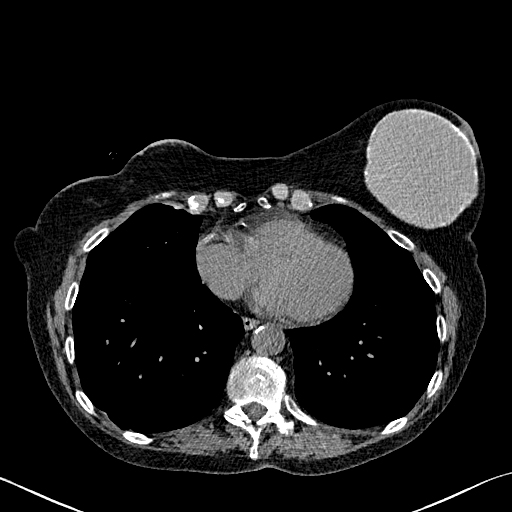
[im 52/156  lung]
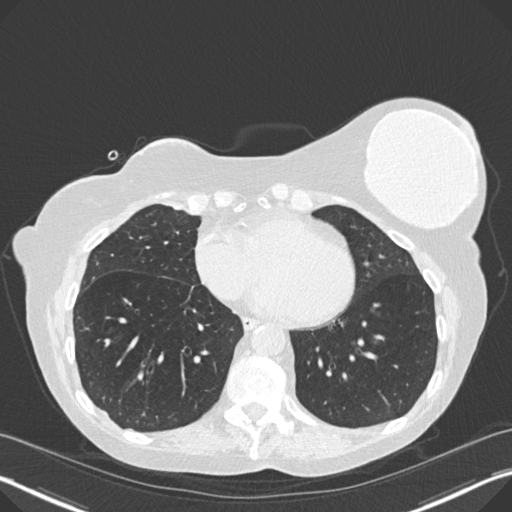
[im 63/156  lung]
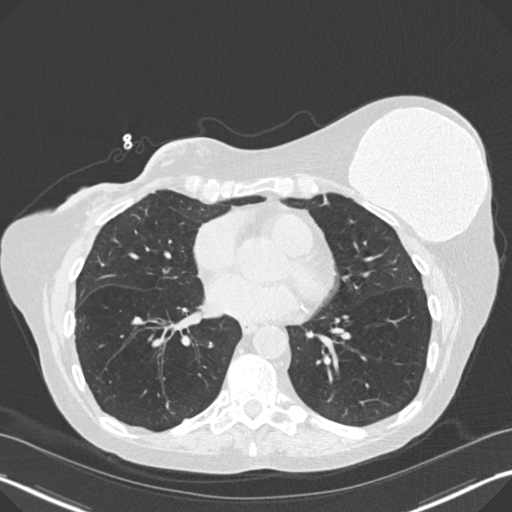
[im 69/156  lung]
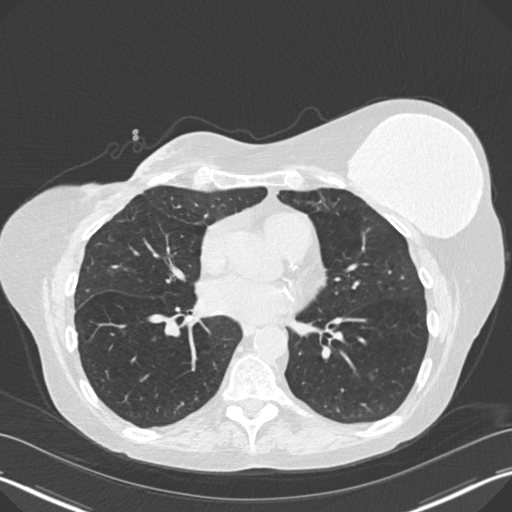
[im 81/156  lung]
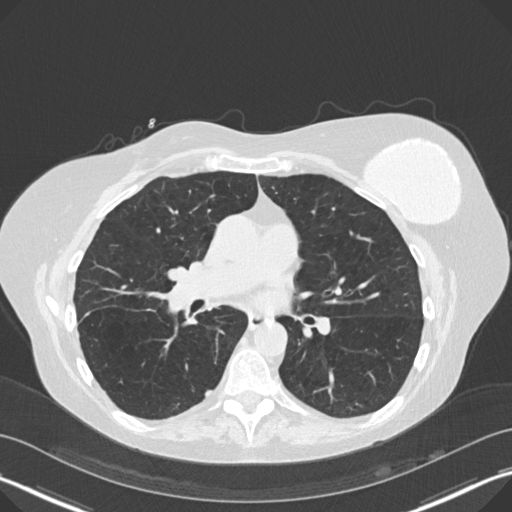
[im 87/156  mediastinal]
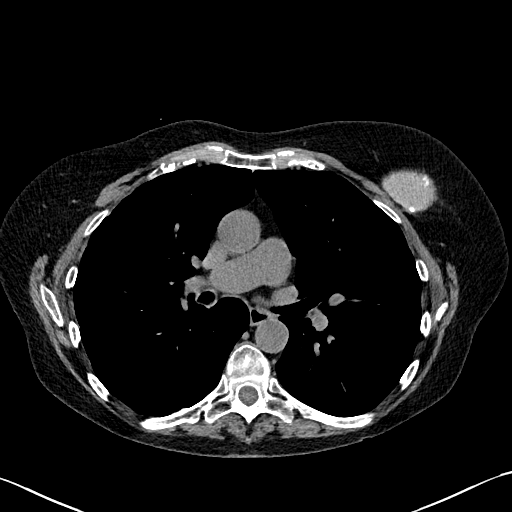
[im 87/156  lung]
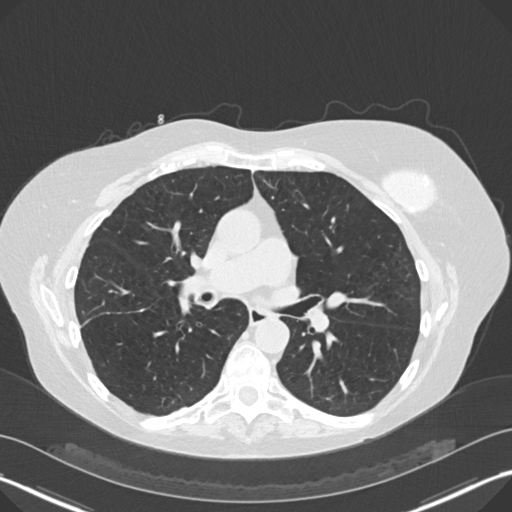
[im 94/156  lung]
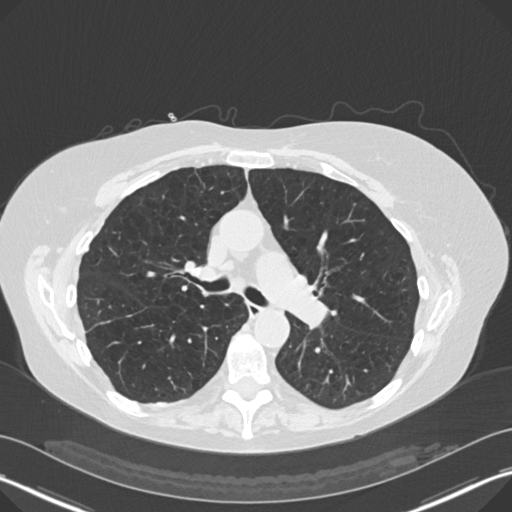
[im 104/156  lung]
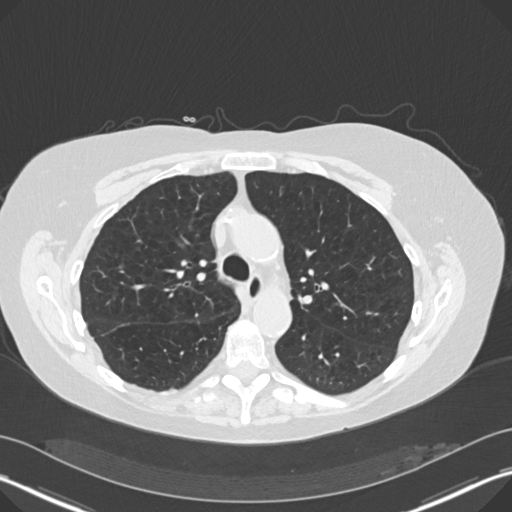
[im 115/156  lung]
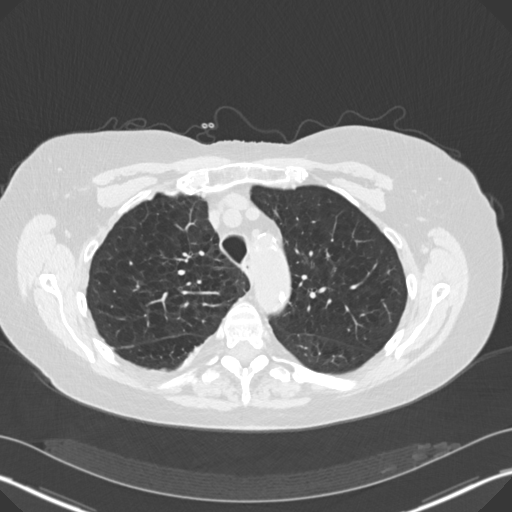
[im 125/156  mediastinal]
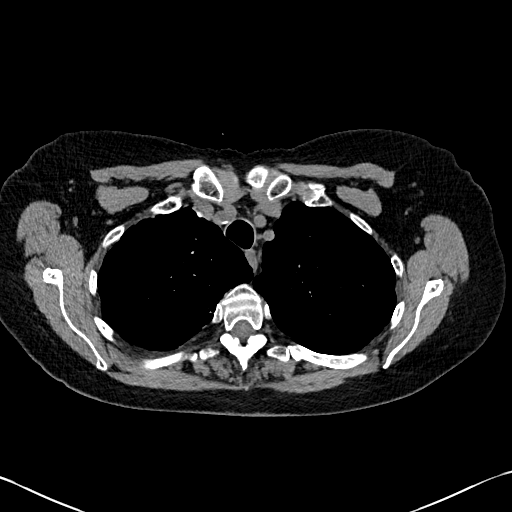
[im 125/156  lung]
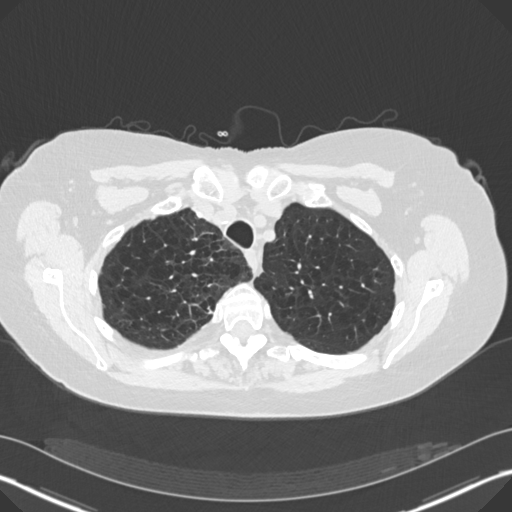
[im 133/156  lung]
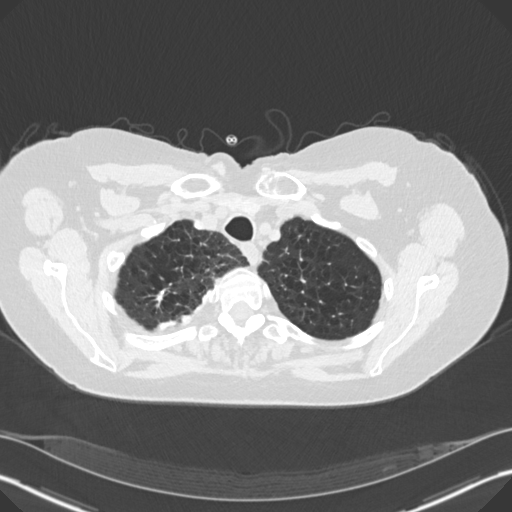
[im 144/156  lung]
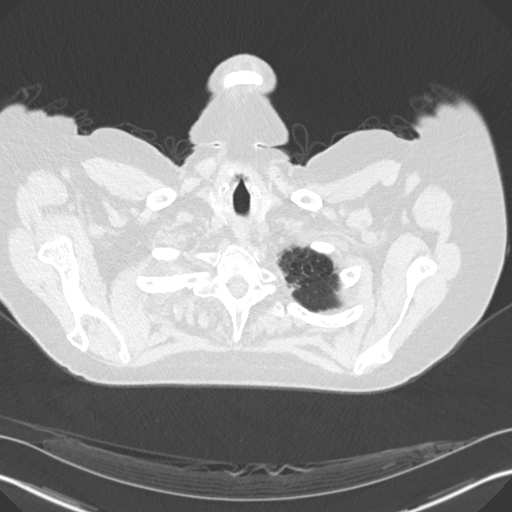

[15 of 34 positions shown; findings below may reference images not displayed]

FINDINGS: Chest wall: Surgical changes related to a prior right mastectomy. No
findings suspicious for recurrent tumor. Skin thickening may be due
to radiation. The left breast contains a prosthesis. No
supraclavicular or axillary lymphadenopathy. The thyroid gland is
grossly normal. Small nodules are noted.

Cardiovascular: The heart is normal in size. No pericardial
effusion. The aorta is normal in caliber. Moderate atherosclerotic
calcifications. Three-vessel coronary artery calcifications.

Mediastinum/Nodes: No mediastinal or hilar mass or lymphadenopathy.
Small scattered lymph nodes are stable. Calcified lymph right hilar
lymph nodes are noted.

Lungs/Pleura: Stable advanced emphysematous changes and areas of
pulmonary scarring.

Stable right apical scarring change on image number 24. There is
also an area of similar scarring nodularity in the right upper lobe
on image number 49. This is stable.

Stable small subpleural pulmonary nodule in the right upper lobe on
image number 28.

Stable pleural thickening, pleural nodules and calcifications
involving the right hemithorax. This is likely due to prior
hemothorax or empyema.

Scattered bilateral calcified granulomas. No new worrisome pulmonary
lesions or acute overlying pulmonary process.

Upper Abdomen: Stable left hepatic lobe cyst. No findings suspicious
for hepatic or adrenal gland metastasis.

Musculoskeletal: No significant bony findings. Remote mid thoracic
compression deformity. No findings suspicious for osseous metastatic
disease.
IMPRESSION: 1. Stable advanced emphysematous changes with areas of pulmonary
scarring and right-sided pleural thickening, nodularity and
calcification.
2. No new or worrisome pulmonary lesions. No acute pulmonary
findings.
3. No mediastinal or hilar mass or adenopathy.
4. Stable surgical changes involving both breasts.
5. Stable atherosclerotic calcifications involving the thoracic and
abdominal aorta and coronary arteries.

## 2018-01-06 IMAGING — CT CT HEAD W/O CM
3 series · 15 of 44 positions shown, 18 images · non-contrast
Comparison: 05/14/2016

CLINICAL DATA: Chest heaviness beginning yesterday.  Dizziness.

EXAM:
CT HEAD WITHOUT CONTRAST
TECHNIQUE: Contiguous axial images were obtained from the base of the skull
through the vertex without intravenous contrast.

[Series 3: coronal soft tissue · coronal · 0.30mm/px · 3 of 67 slices shown]
[im 23/67  brain]
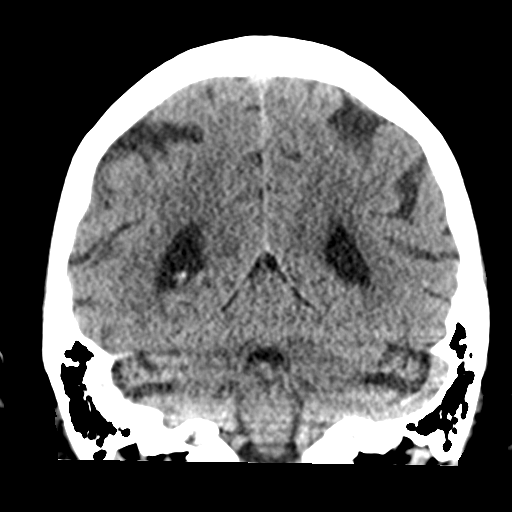
[im 30/67  brain]
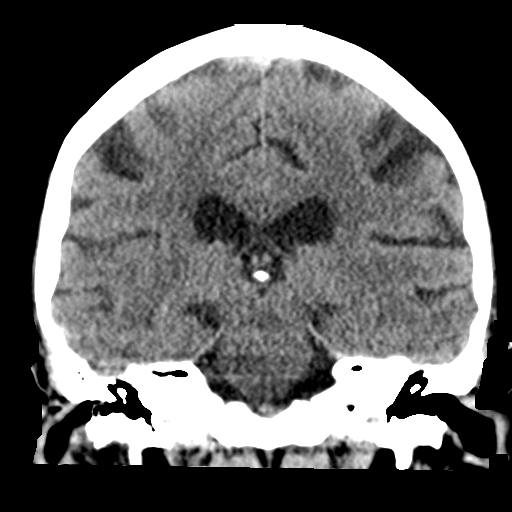
[im 37/67  brain]
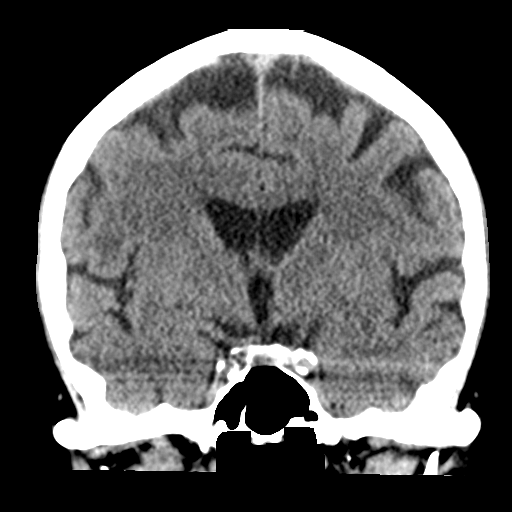

[Series 4: sagittal soft tissue · sagittal · 0.30mm/px · 3 of 53 slices shown]
[im 18/53  brain]
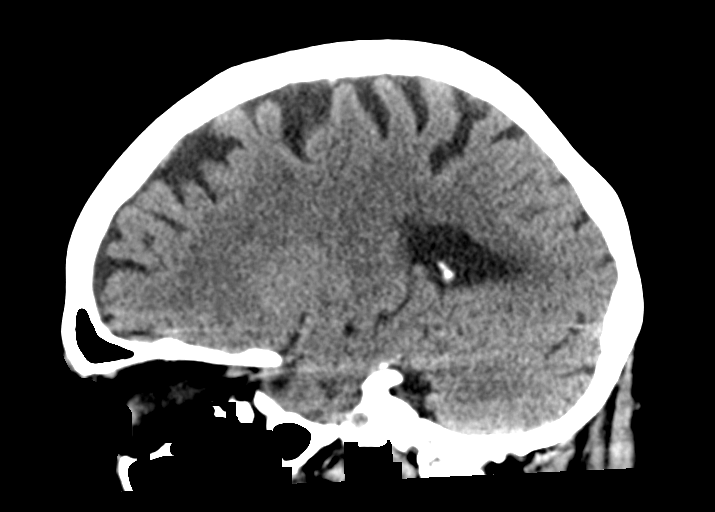
[im 27/53  brain]
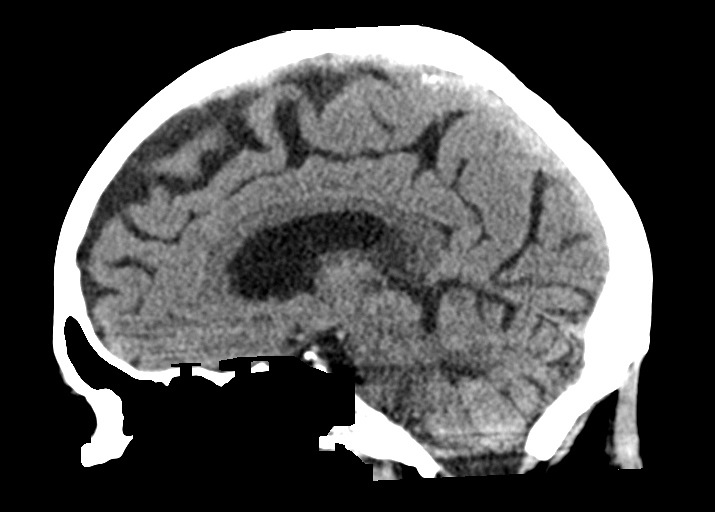
[im 35/53  brain]
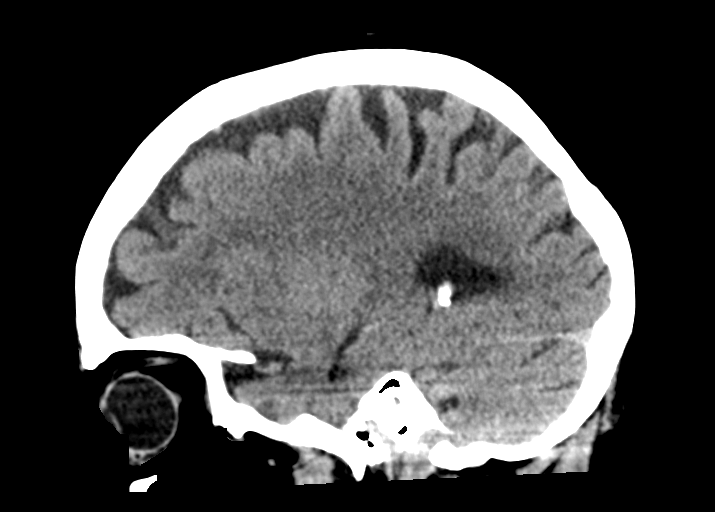

[Series 5: head wo · axial · 0.40mm/px · z∈[-108,+2]mm · 9 of 27 slices shown, 12 images]
[im 3/27  brain]
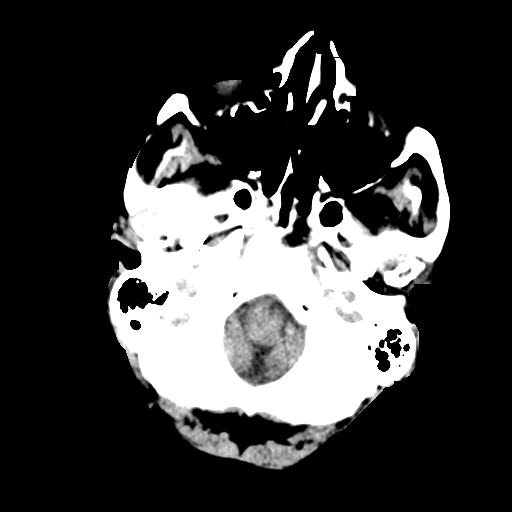
[im 3/27  bone]
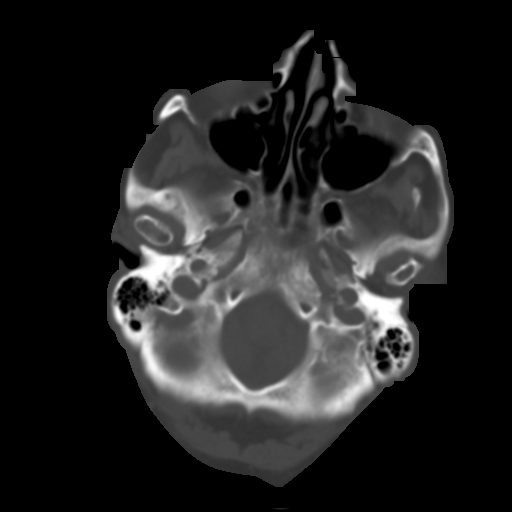
[im 6/27  brain]
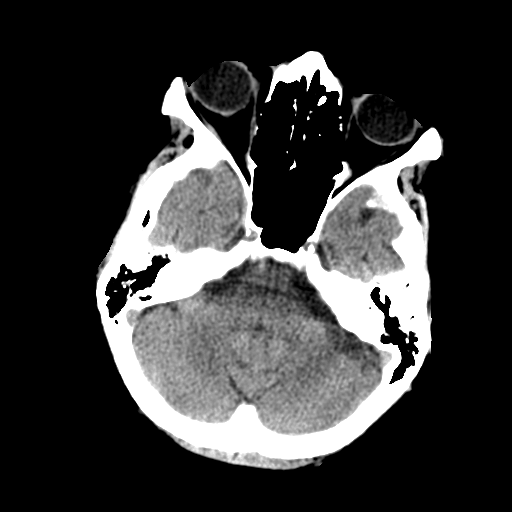
[im 8/27  brain]
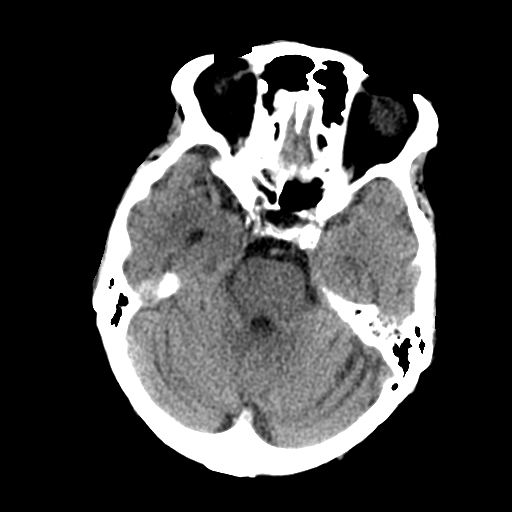
[im 11/27  brain]
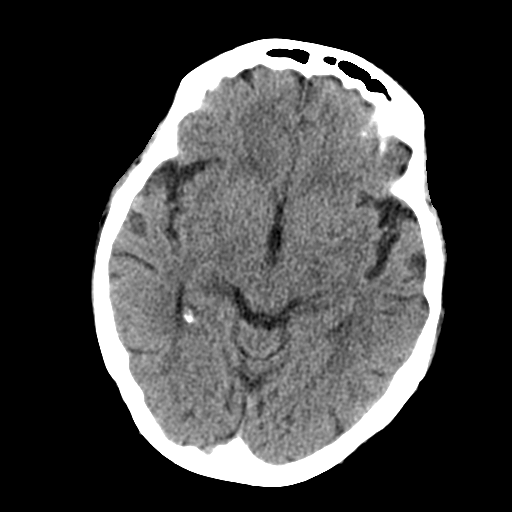
[im 14/27  brain]
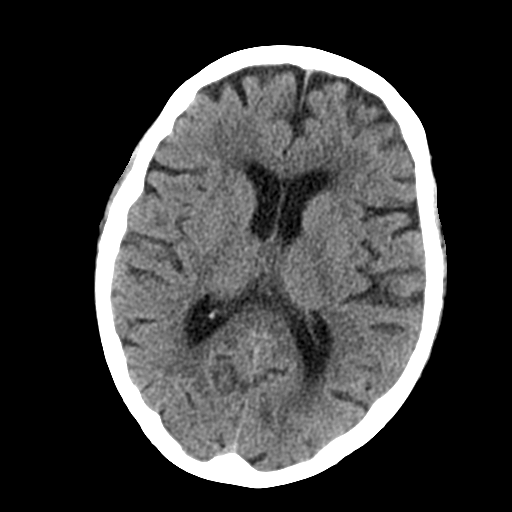
[im 14/27  bone]
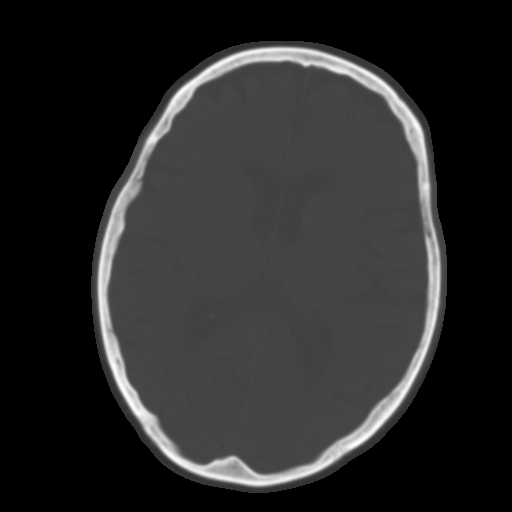
[im 17/27  brain]
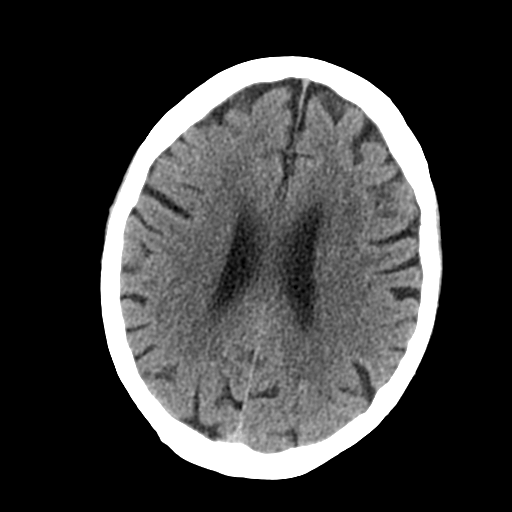
[im 20/27  brain]
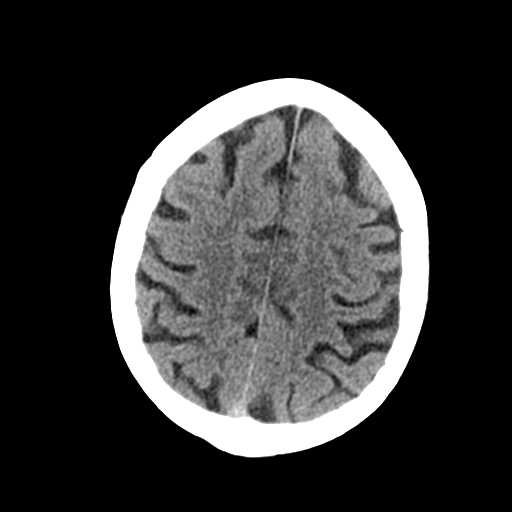
[im 22/27  brain]
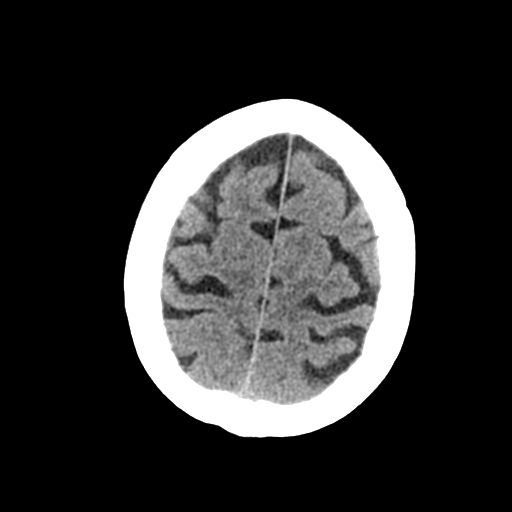
[im 25/27  brain]
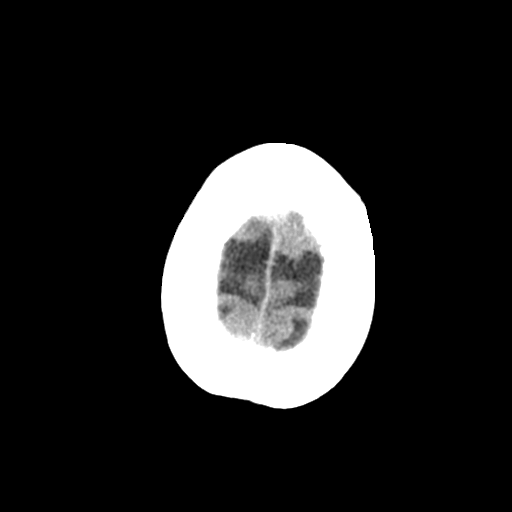
[im 25/27  bone]
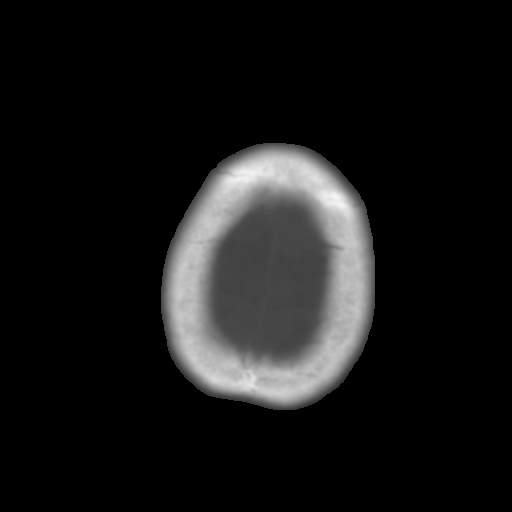

[15 of 44 positions shown; findings below may reference images not displayed]

FINDINGS: Brain: Age related atrophy. Mild to moderate chronic small-vessel
ischemic change of the deep white matter. No sign of acute
infarction, mass lesion, hemorrhage, hydrocephalus or extra-axial
collection.

Vascular: There is atherosclerotic calcification of the major
vessels at the base of the brain.

Skull: Negative

Sinuses/Orbits: Clear/normal.

Other: None significant
IMPRESSION: Atrophy and chronic small-vessel ischemic changes. No acute finding
by CT. No finding to explain dizziness.

## 2018-01-09 IMAGING — DX DG CHEST 1V
1 series · 1 of 1 positions shown · non-contrast
Comparison: September 25, 2016.

CLINICAL DATA: Chest pain and bradycardia

EXAM:
CHEST 1 VIEW

[chest ap]
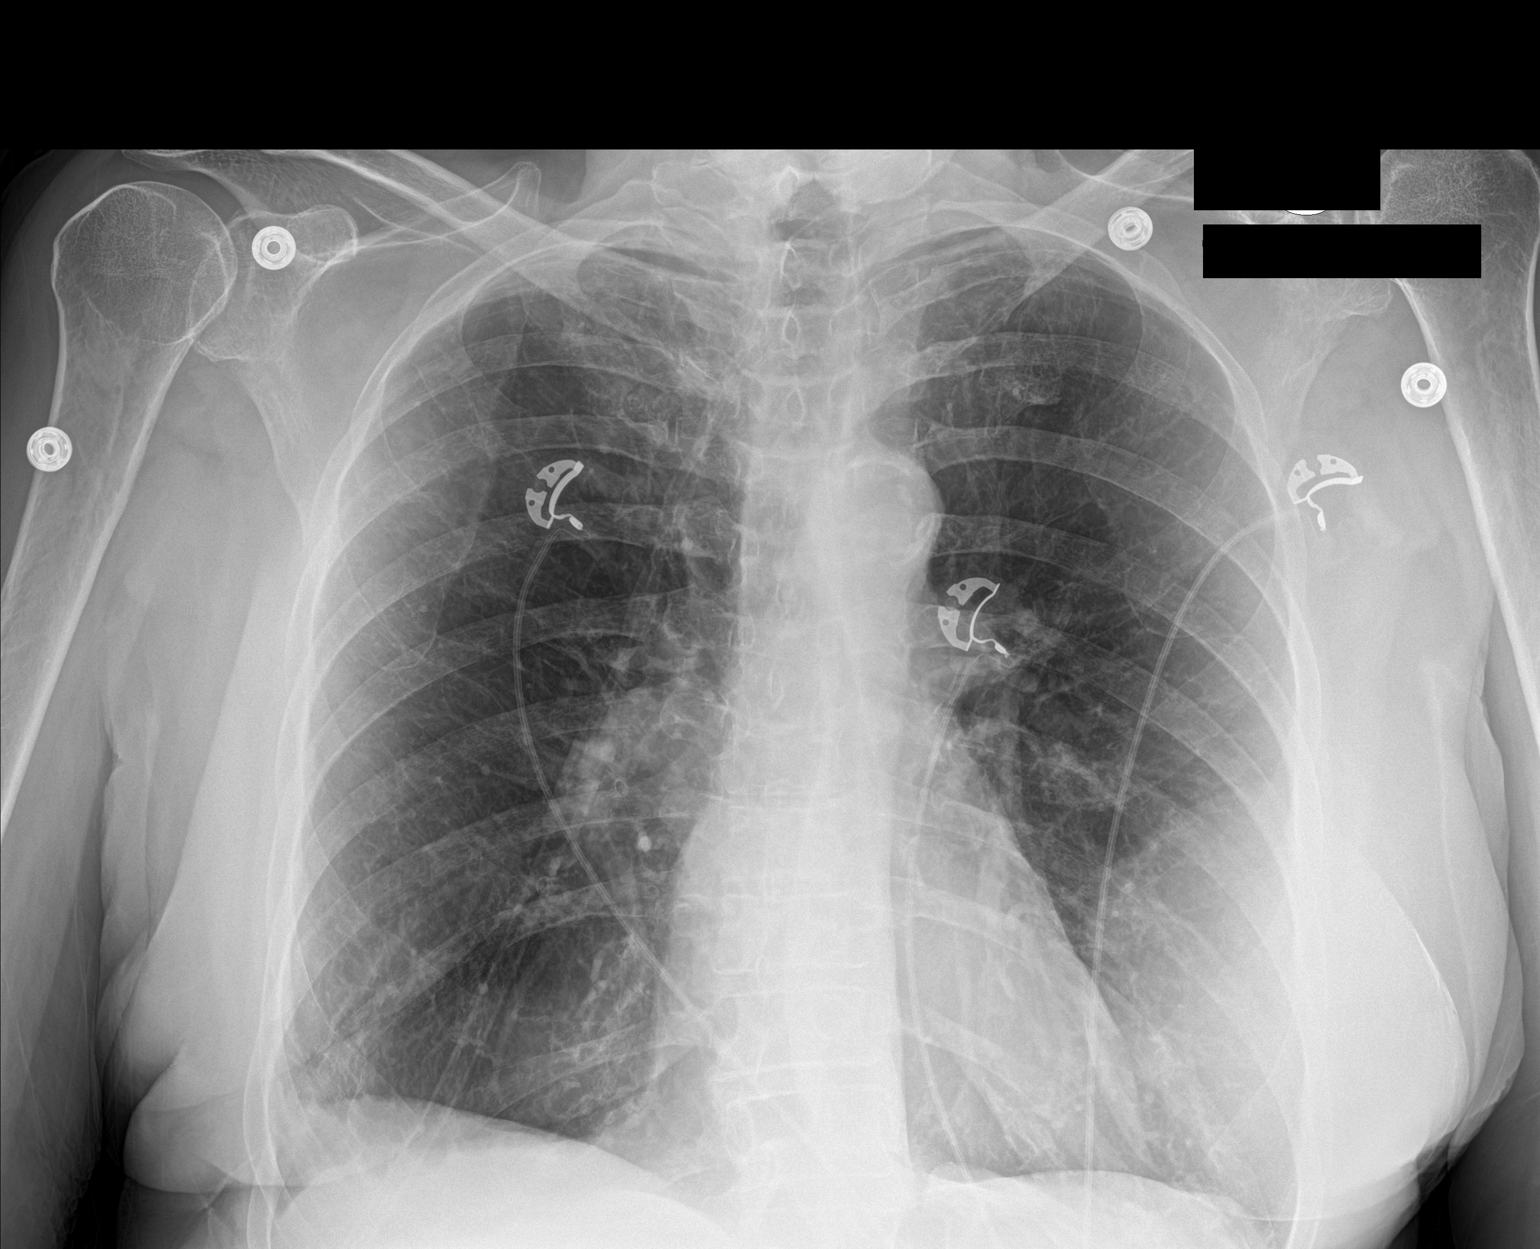

[1 of 1 positions shown; findings below may reference images not displayed]

FINDINGS: There is scarring in the right base. There is no edema or
consolidation. Heart size and pulmonary vascularity are normal. No
adenopathy. There is a breast implant on the left. There is aortic
atherosclerosis. No bone lesions. No pneumothorax.
IMPRESSION: Scarring right base. No edema or consolidation. Stable cardiac
silhouette. There is aortic atherosclerosis.

Aortic Atherosclerosis ([id number]).

## 2018-01-12 IMAGING — DX DG ABDOMEN 1V
1 series · 1 of 1 positions shown · non-contrast
Comparison: CT 10/03/2015

CLINICAL DATA: 75-year-old female with a history of constipation

EXAM:
ABDOMEN - 1 VIEW

[abdomen kub]
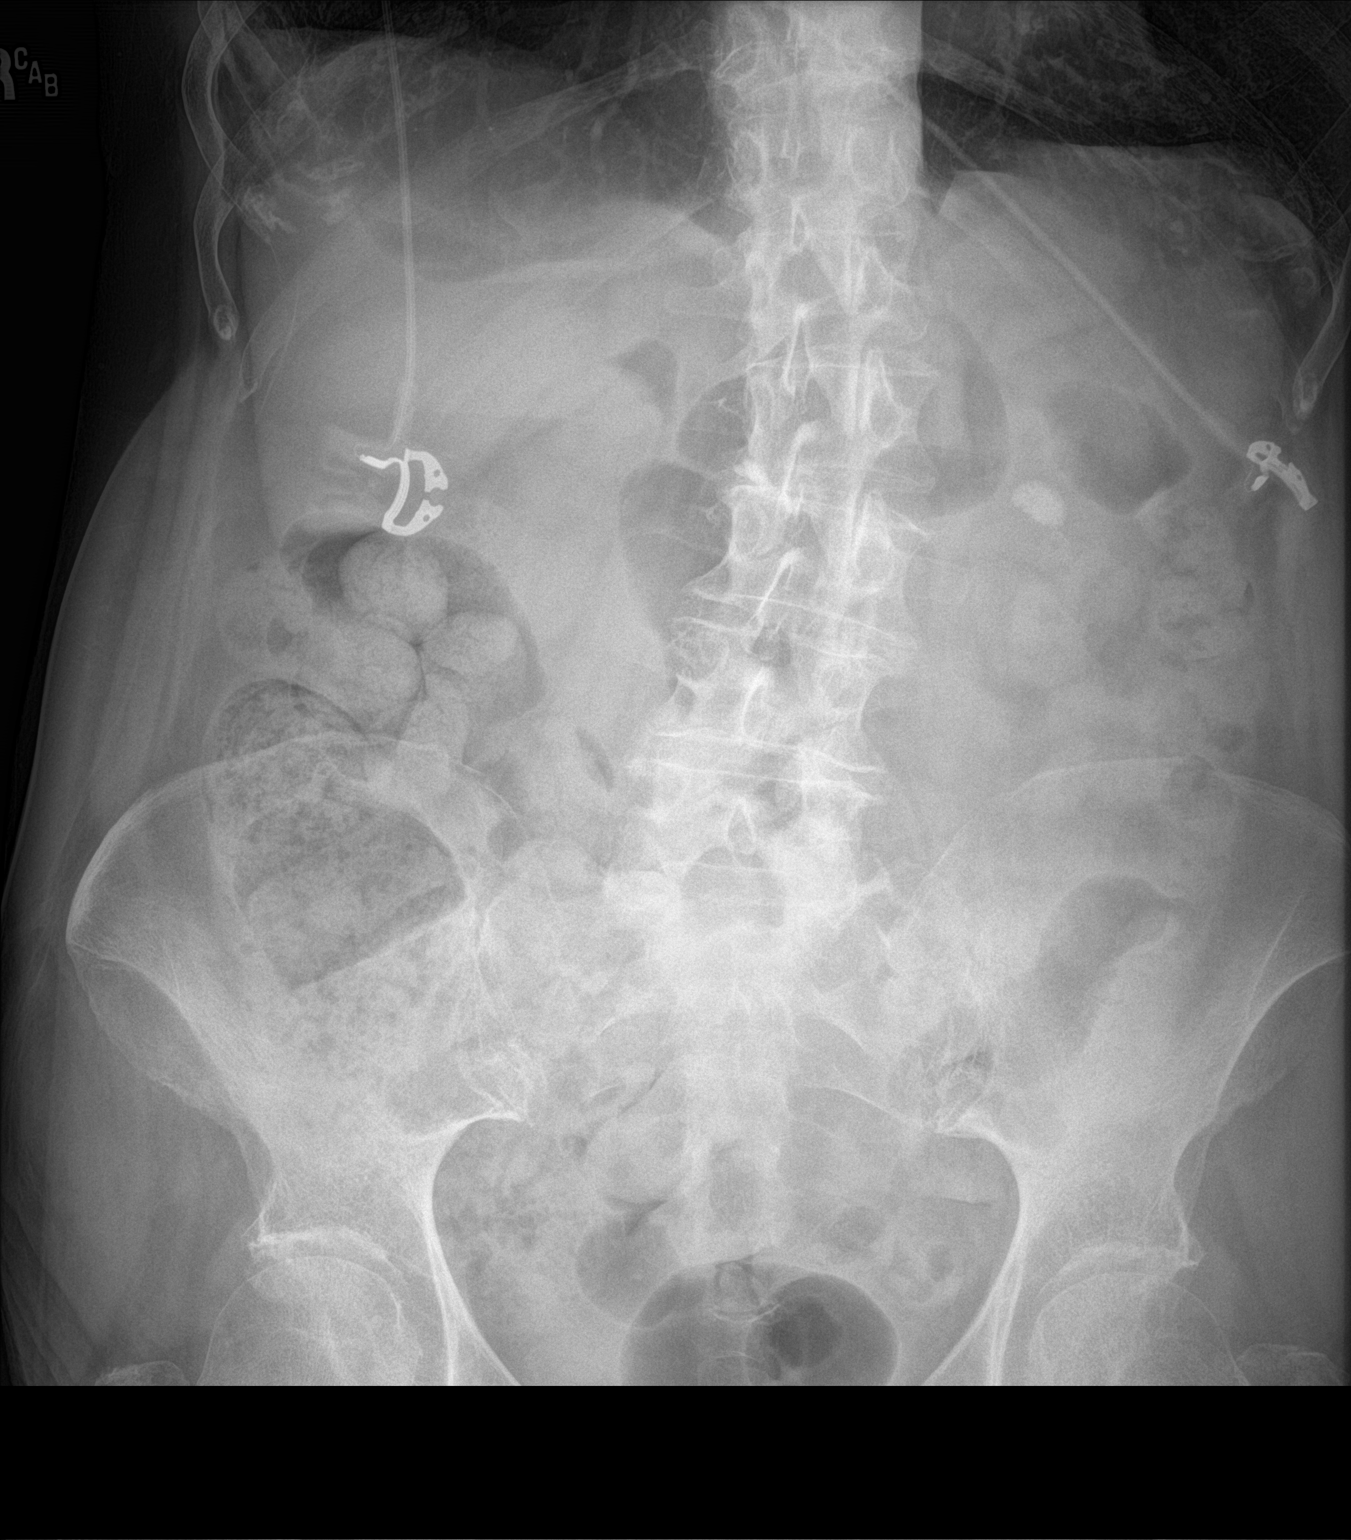

[1 of 1 positions shown; findings below may reference images not displayed]

FINDINGS: Gas within stomach, small bowel, colon. No abnormally distended
small bowel or colon. Gas extends to the rectum.

Formed stool within the right colon and proximal transverse colon.

No displaced fracture.  Degenerative changes of the spine.

Rounded radiopaque density in the left abdomen measures
approximately 14- 15 mm, compatible with known left collecting
system nephrolithiasis.
IMPRESSION: Moderate stool burden without evidence of obstruction.

Known left-sided nephrolithiasis

## 2018-06-07 ENCOUNTER — Ambulatory Visit: Payer: Medicare Other | Admitting: Urology

## 2018-07-01 IMAGING — CT CT RENAL STONE PROTOCOL
2 of 4 series · 16 of 46 positions shown, 18 images · non-contrast
Comparison: Abdominal CT 10/02/2013

CLINICAL DATA: Hematuria

EXAM:
CT ABDOMEN AND PELVIS WITHOUT CONTRAST
TECHNIQUE: Multidetector CT imaging of the abdomen and pelvis was performed
following the standard protocol without IV contrast.

[Series 2: stone full standard · axial · 0.74mm/px · z∈[-452,-92]mm · 13 of 80 slices shown, 15 images]
[im 4/80  soft-tissue]
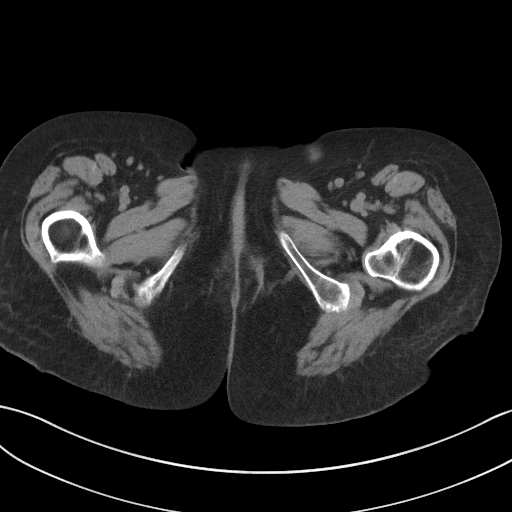
[im 4/80  bone]
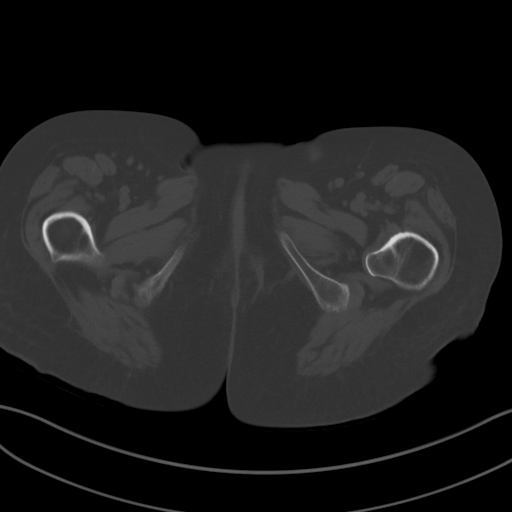
[im 10/80  soft-tissue]
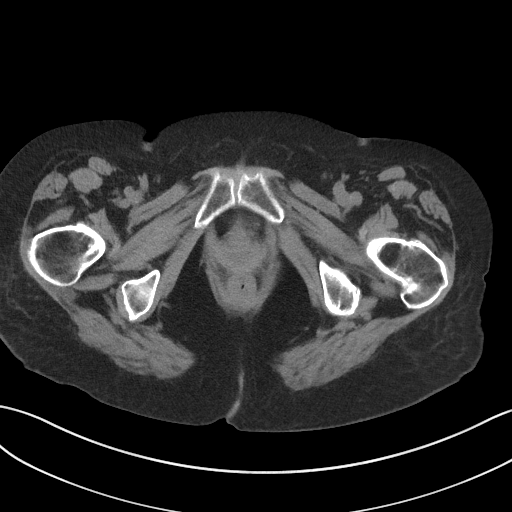
[im 16/80  soft-tissue]
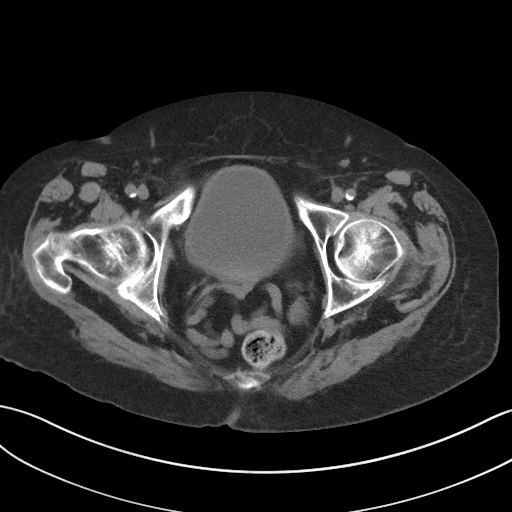
[im 23/80  soft-tissue]
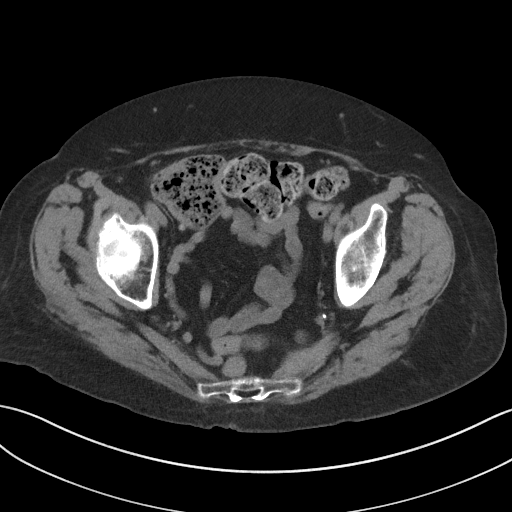
[im 29/80  soft-tissue]
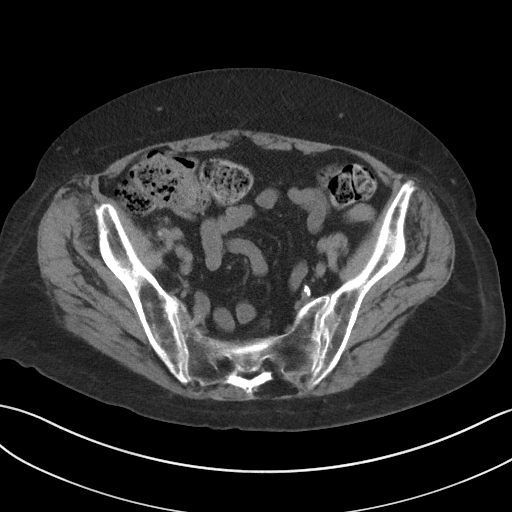
[im 35/80  soft-tissue]
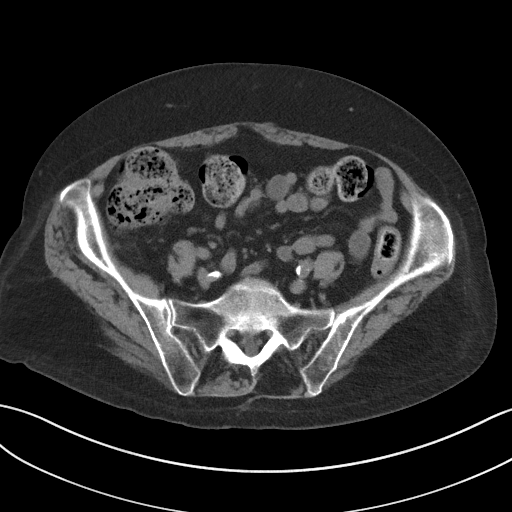
[im 42/80  soft-tissue]
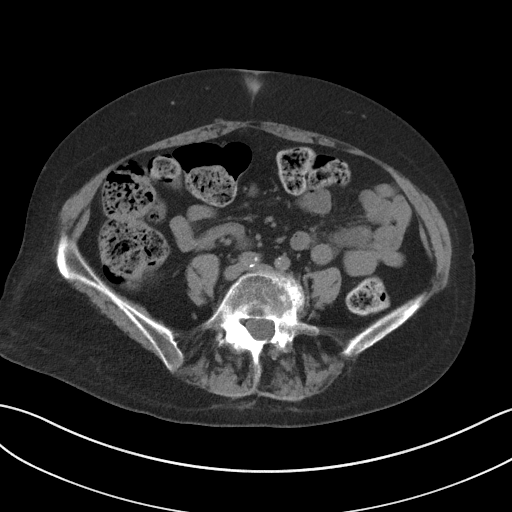
[im 45/80  soft-tissue]
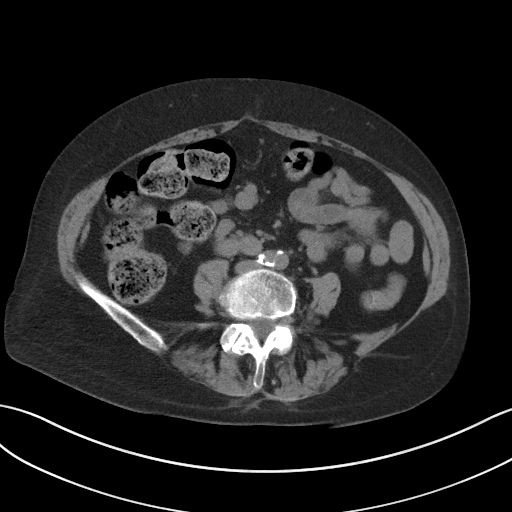
[im 51/80  soft-tissue]
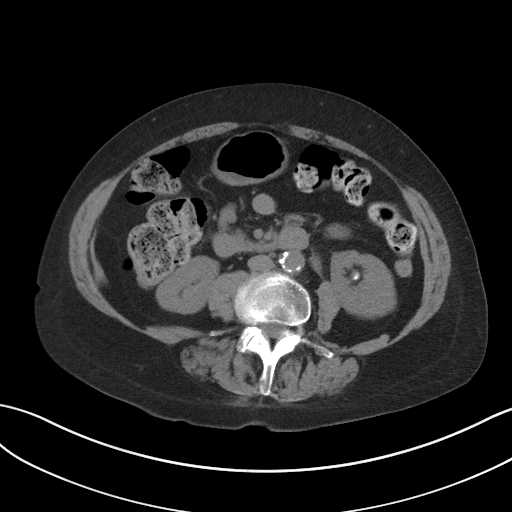
[im 51/80  bone]
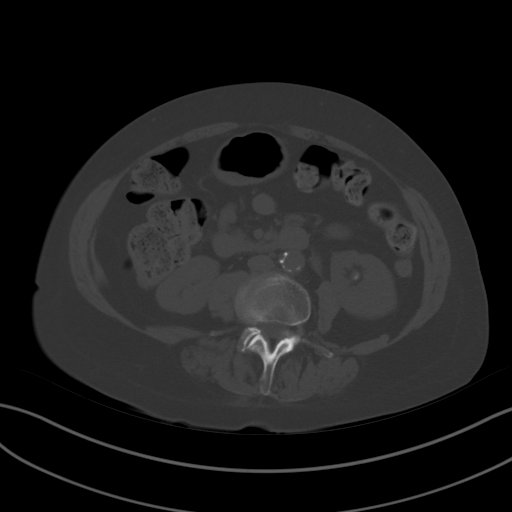
[im 57/80  soft-tissue]
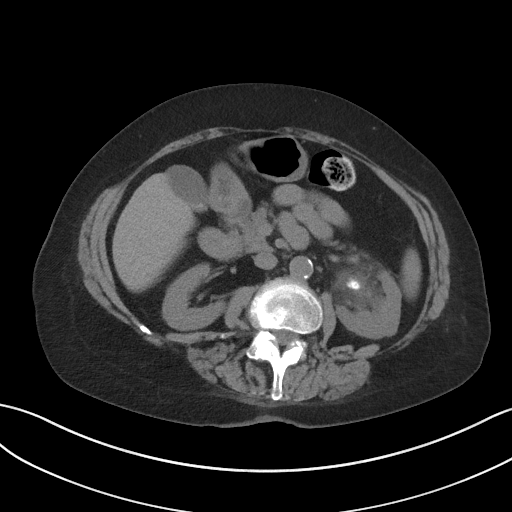
[im 64/80  soft-tissue]
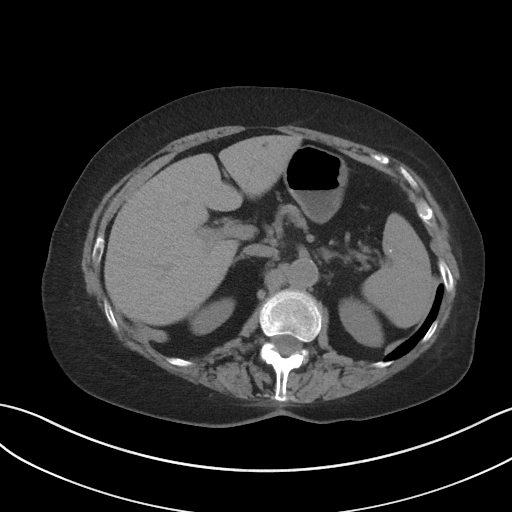
[im 70/80  soft-tissue]
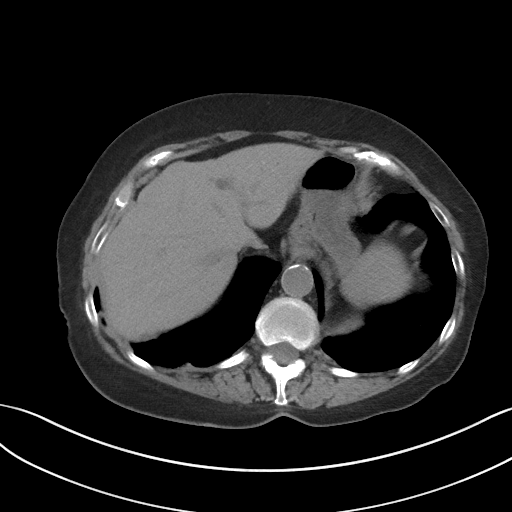
[im 76/80  soft-tissue]
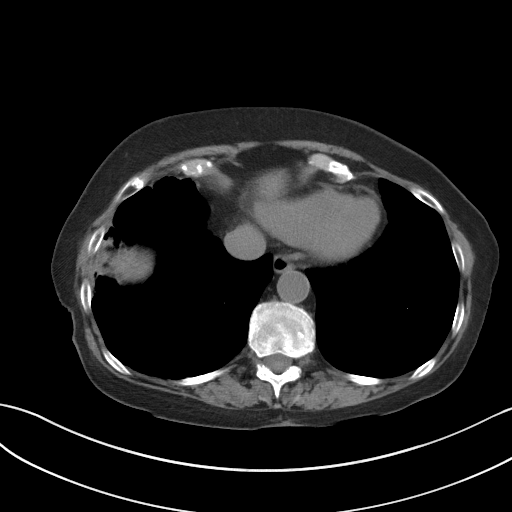

[Series 5: coronal · coronal · 0.74mm/px · 3 of 123 slices shown]
[im 41/123  soft-tissue]
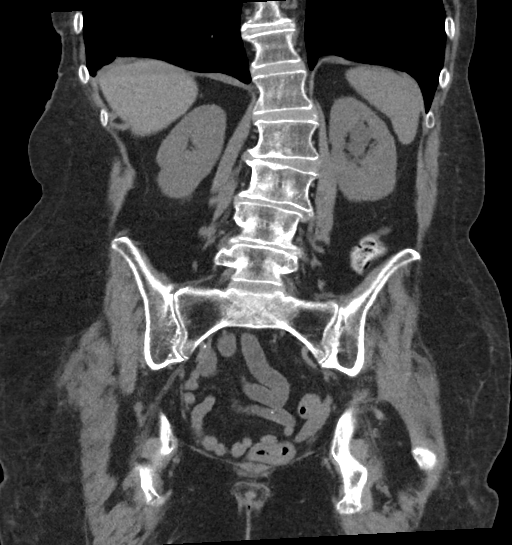
[im 55/123  soft-tissue]
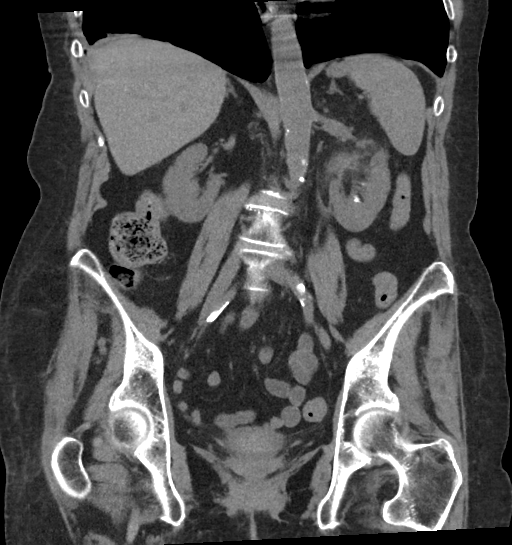
[im 68/123  soft-tissue]
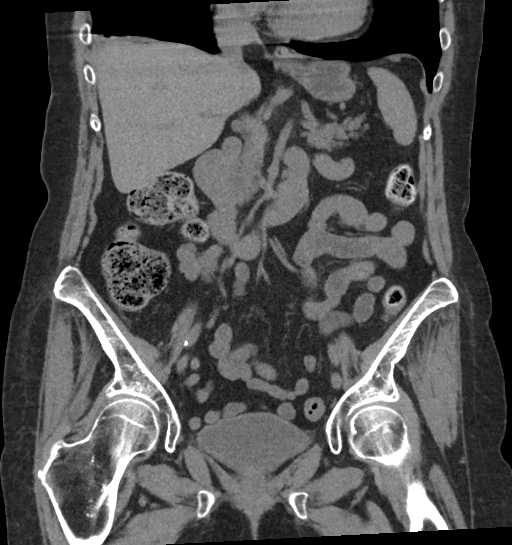

[16 of 46 positions shown; findings below may reference images not displayed]

FINDINGS: Lower chest: No pulmonary nodules or pleural effusion. No visible
pericardial effusion.

Hepatobiliary: Normal hepatic contours and density. No visible
biliary dilatation. Normal gallbladder.

Pancreas: Normal contours without ductal dilatation. No
peripancreatic fluid collection.

Spleen: Normal.

Adrenals/Urinary Tract:

--Adrenal glands: Normal.

--Right kidney/ureter: No hydronephrosis or perinephric stranding.
No nephrolithiasis. No obstructing ureteral stones.

--Left kidney/ureter: There is an obstructing stone with the left
renal pelvis, measuring 16 x 9 mm. This stone is markedly enlarged
compared to the prior study, where it measured approximately 13 x 6
mm. There are multiple smaller stones at the lower pole left kidney.
There is moderate hydronephrosis with perinephric and periureteral
stranding. There is no stone in the ureter.

--Urinary bladder: Unremarkable.

Stomach/Bowel:

--Stomach/Duodenum: No hiatal hernia or other gastric abnormality.
Normal duodenal course and caliber.

--Small bowel: No dilatation or inflammation.

--Colon: No focal abnormality.

--Appendix: Normal.

Vascular/Lymphatic: Atherosclerotic calcification is present within
the non-aneurysmal abdominal aorta, without hemodynamically
significant stenosis. No abdominal or pelvic lymphadenopathy.

Reproductive: Status post hysterectomy. No adnexal mass.

Musculoskeletal. No bony spinal canal stenosis or focal osseous
abnormality.

Other: None.
IMPRESSION: 1. Increased size of left renal pelvis stone, now measuring 16 x 9
mm and causing moderate left hydronephrosis with perinephric and
periureteral stranding. No stone in the ureter.
2. Multiple smaller left renal stones at the lower pole.
3.  Aortic Atherosclerosis ([id number]-9BC.C).

## 2018-10-29 IMAGING — CR DG CHEST 2V
2 series · 3 of 3 positions shown · non-contrast
Comparison: Chest radiograph September 28, 2016

CLINICAL DATA: Shortness of breath since this morning. History of
COPD.

EXAM:
CHEST - 2 VIEW

[chest lat]
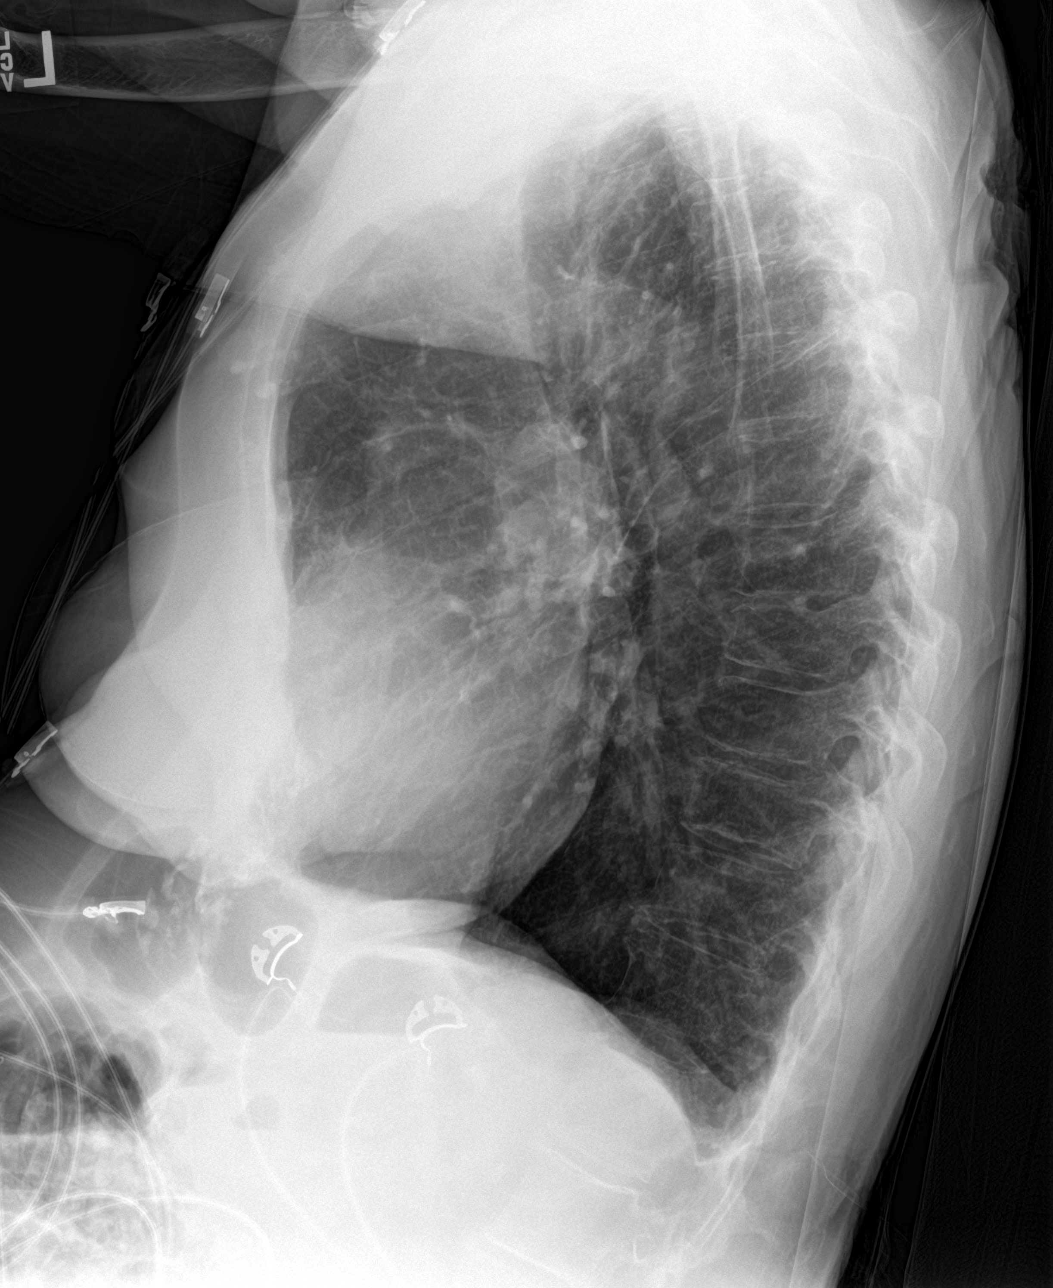

[Series 3: chest ap · 0.14mm/px · 2 of 2 slices shown]
[im 1/2]
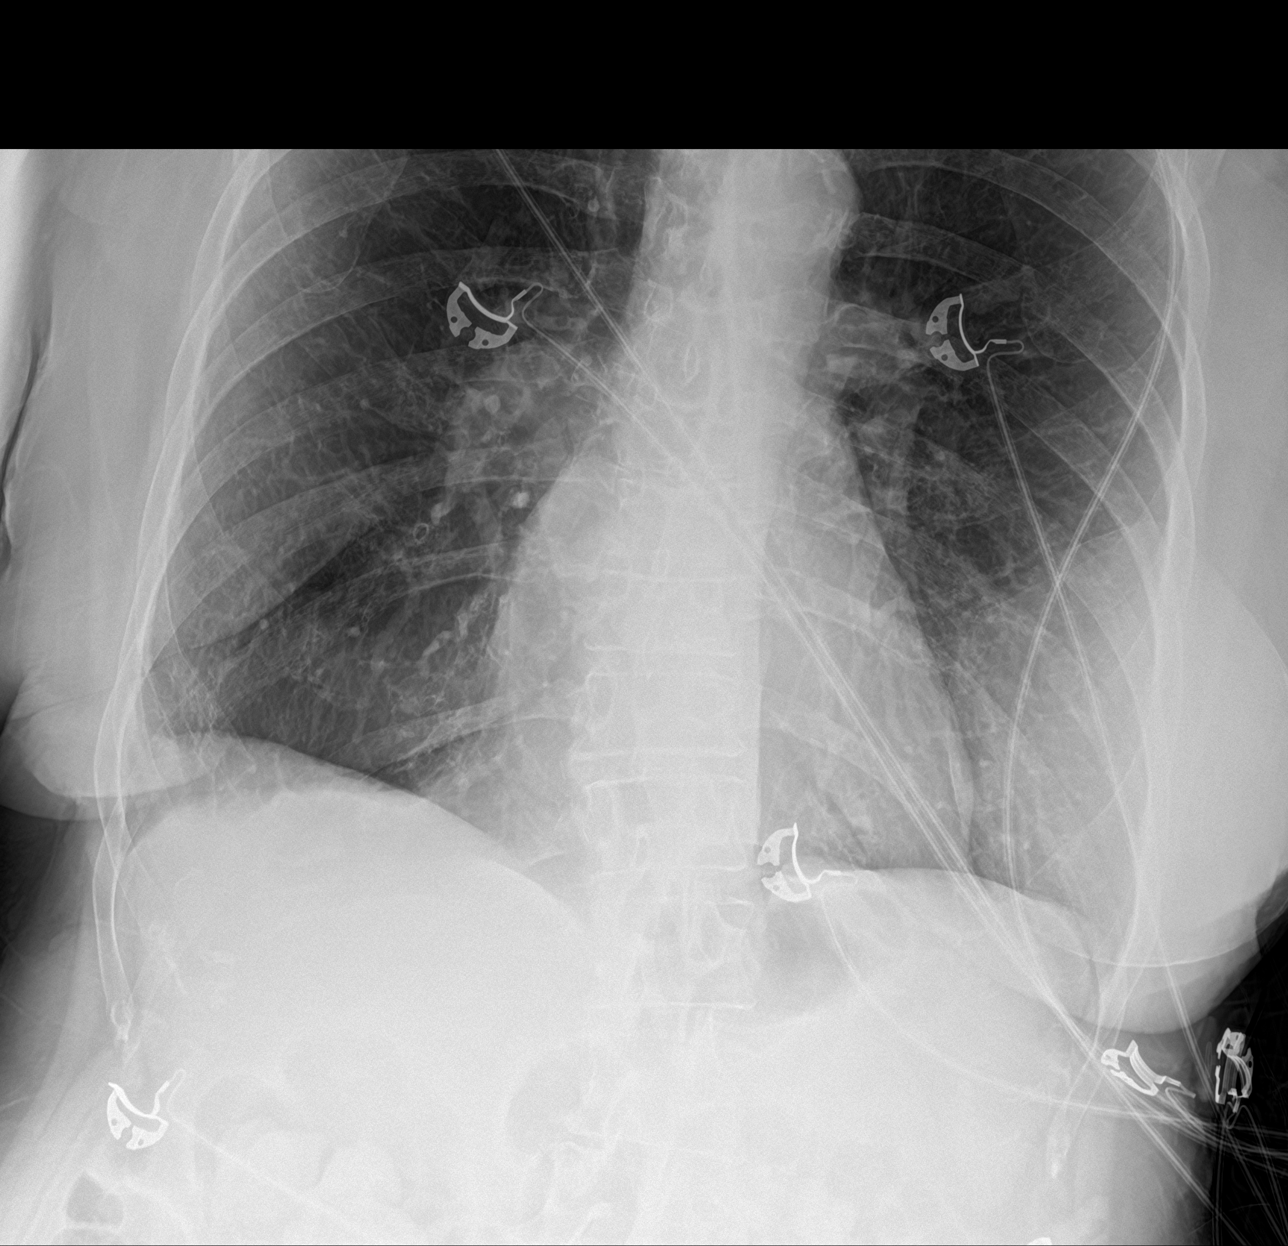
[im 2/2]
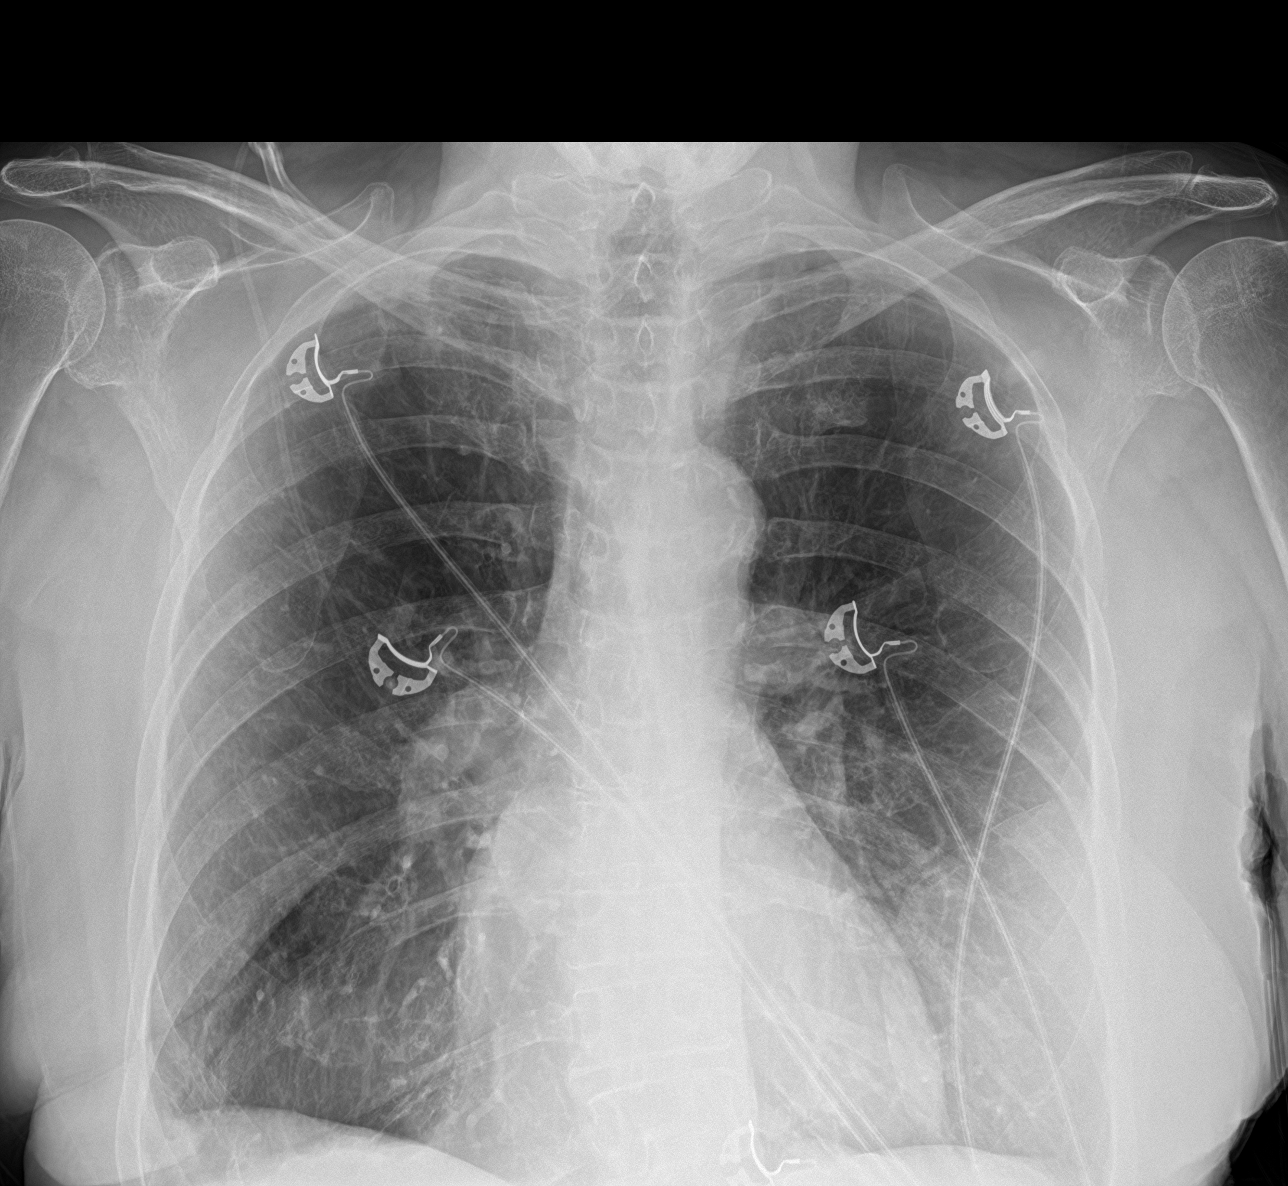

[3 of 3 positions shown; findings below may reference images not displayed]

FINDINGS: Cardiomediastinal silhouette is normal. Calcified aortic arch. No
pleural effusions or focal consolidations. Mild chronic interstitial
changes increased lung volumes. Stable RIGHT lung base scarring.
Trachea projects midline and there is no pneumothorax. Soft tissue
planes and included osseous structures are non-suspicious. Calcified
LEFT breast implant. Mild scoliosis.
IMPRESSION: COPD.  No acute cardiopulmonary process.

Aortic Atherosclerosis ([id number].D).

## 2018-11-01 IMAGING — CR DG CHEST 2V
3 series · 3 of 3 positions shown · non-contrast
Comparison: 07/18/2017

CLINICAL DATA: Shortness of Breath

EXAM:
CHEST - 2 VIEW

[chest lat]
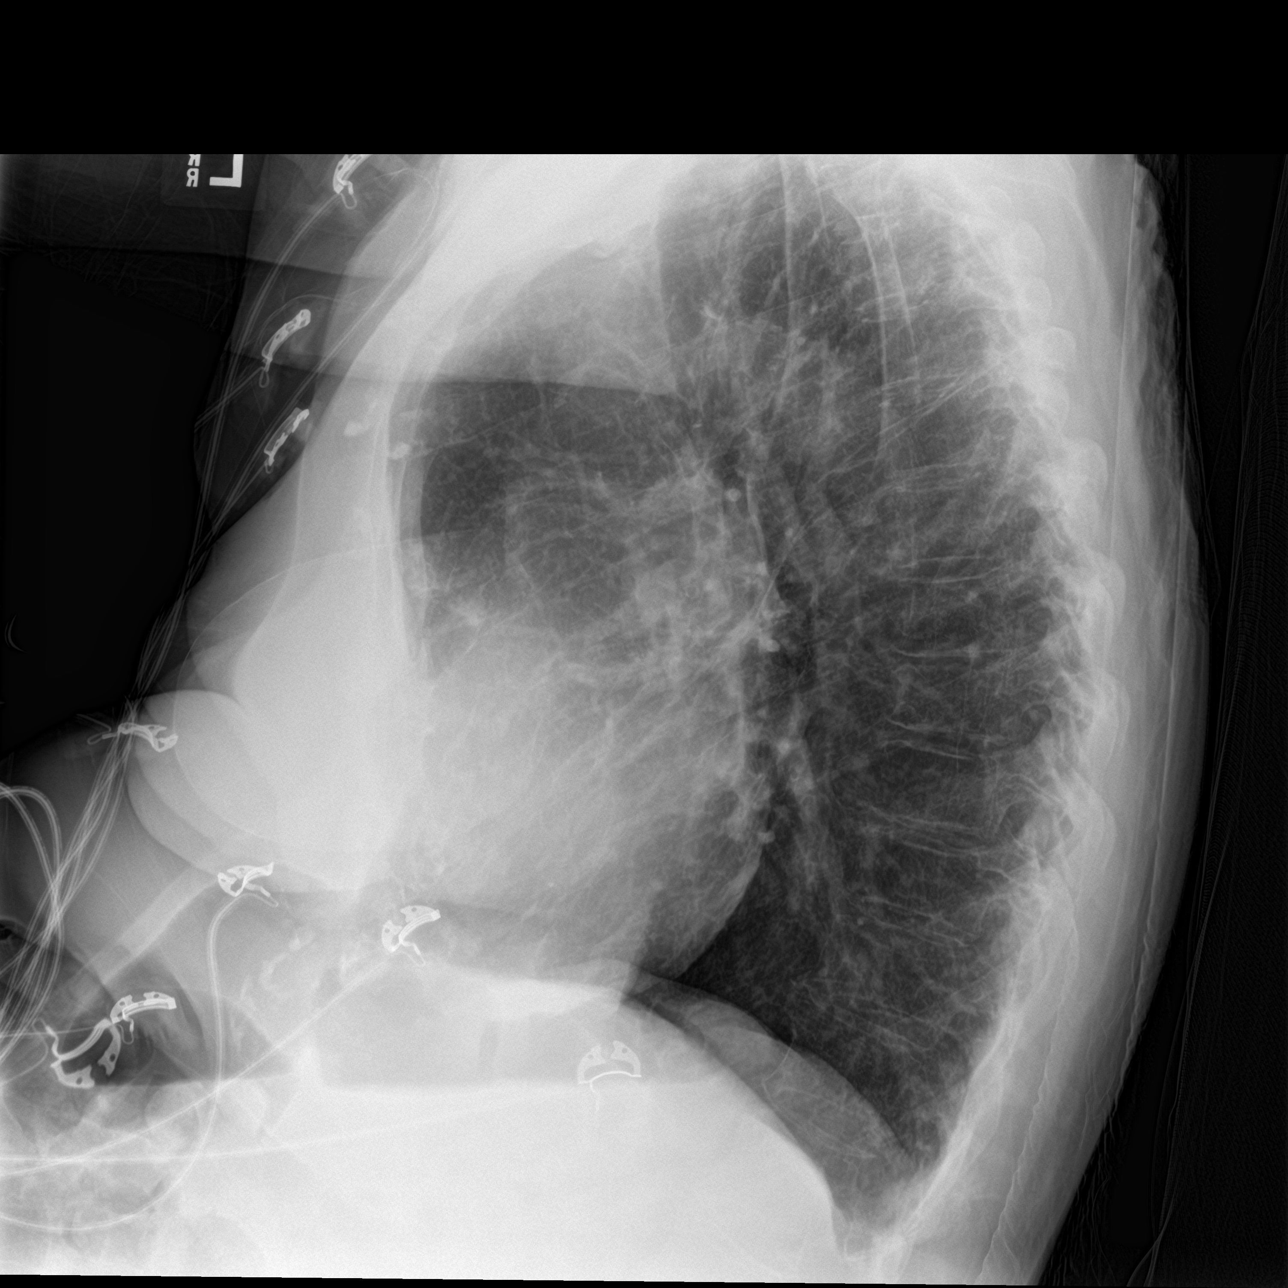

[chest ap (1 of 2)]
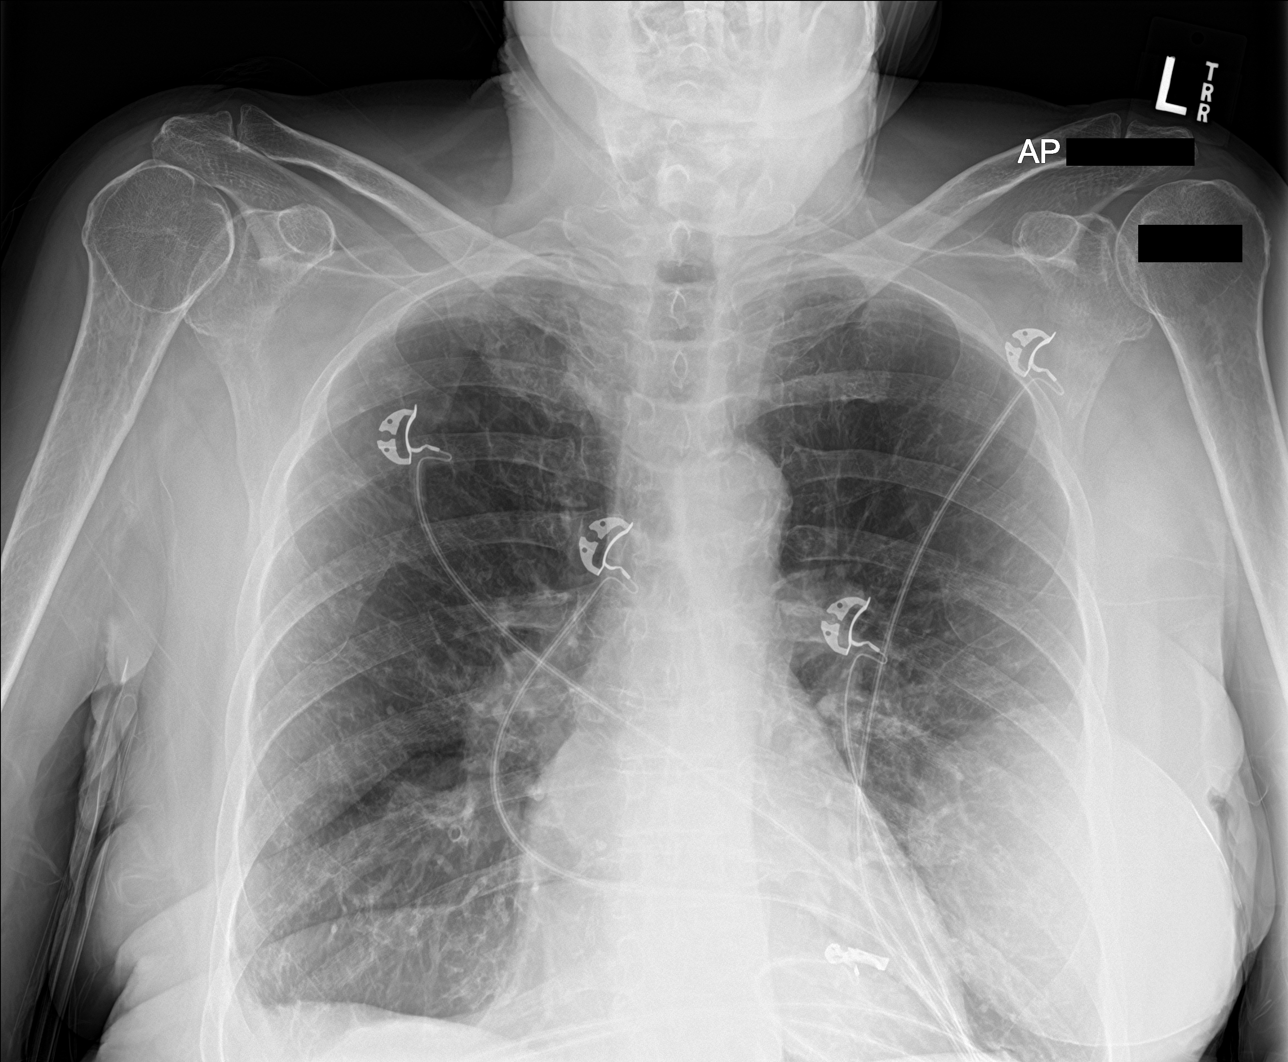

[chest ap (2 of 2)]
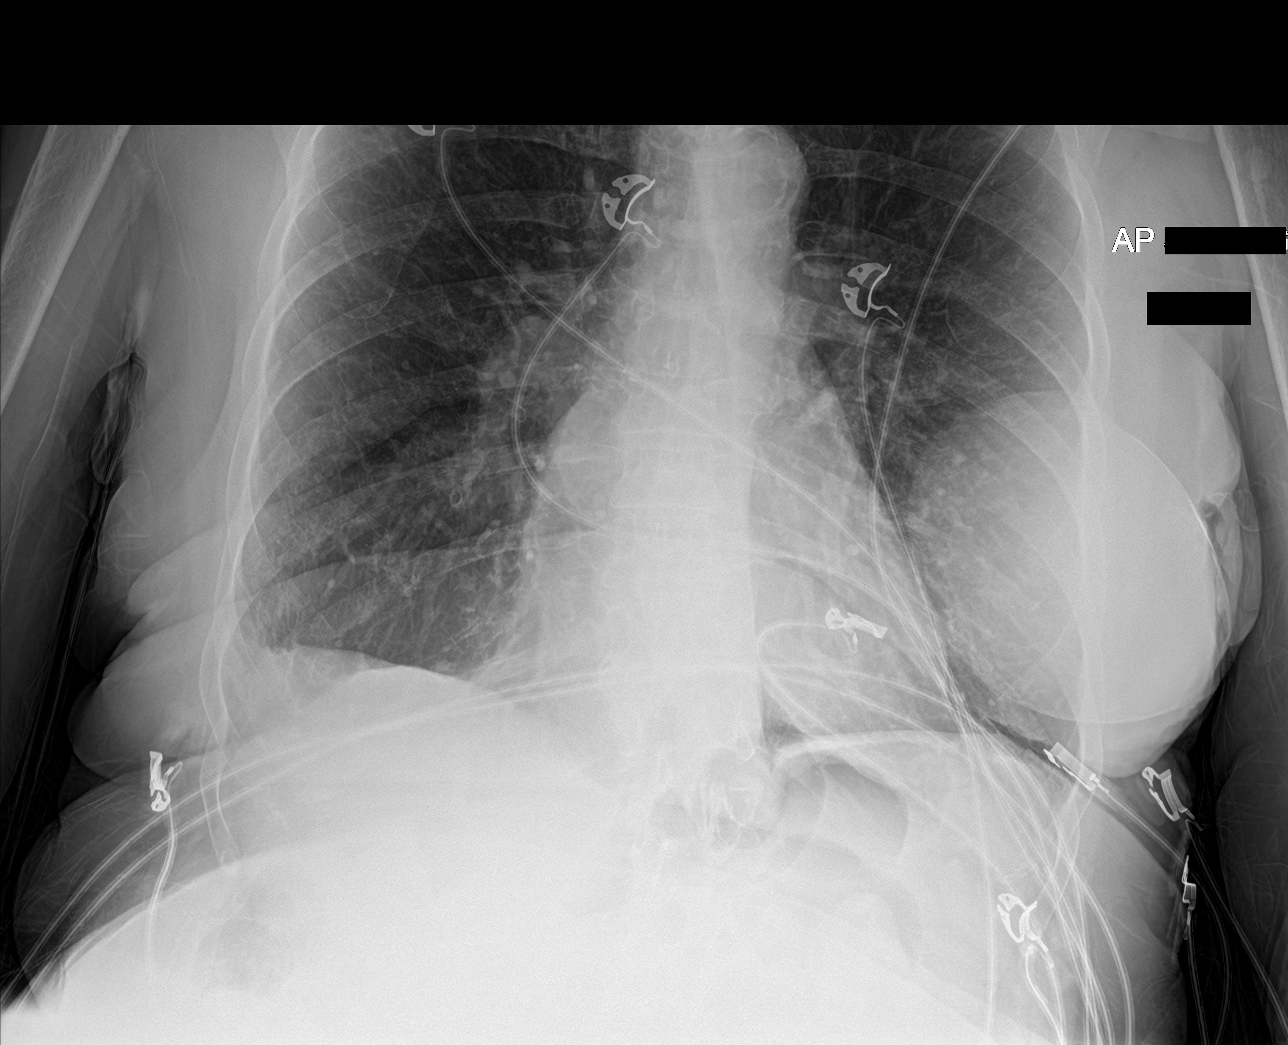

[3 of 3 positions shown; findings below may reference images not displayed]

FINDINGS: Mild hyperinflation/COPD. Heart is upper limits normal in size. No
confluent opacities or effusions. No acute bony abnormality.
IMPRESSION: Hyperinflation/COPD.  No active disease.

## 2019-10-19 IMAGING — US US RENAL
1 series · 14 of 25 positions shown · non-contrast
Comparison: CT, 03/20/2017

CLINICAL DATA: Left-sided kidney stone. History of lithotripsy and
left renal stent.

EXAM:
RENAL / URINARY TRACT ULTRASOUND COMPLETE

[Series 1: us renal · 0.23mm/px · 14 of 35 slices shown]
[im 1/35]
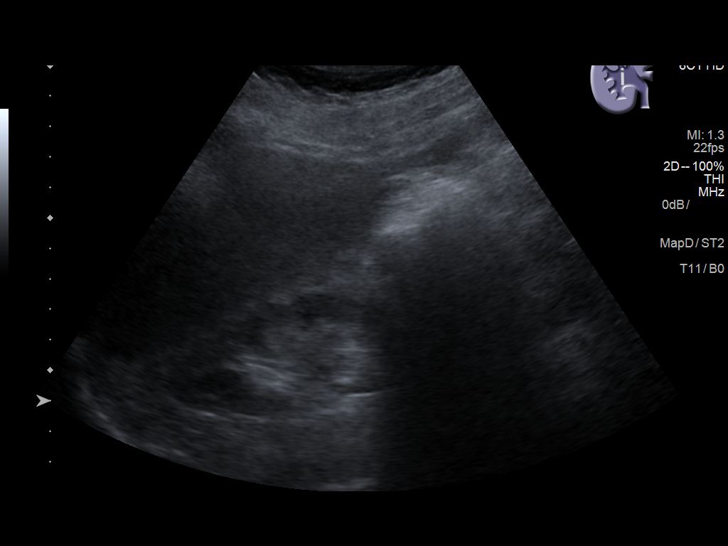
[im 3/35]
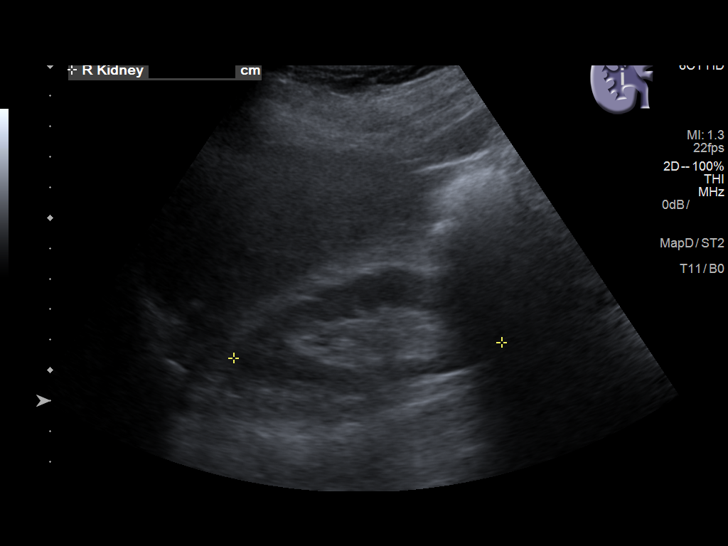
[im 6/35]
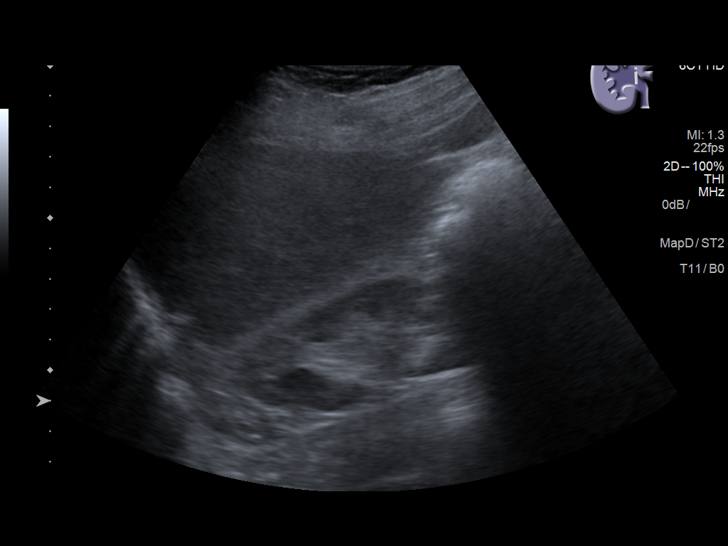
[im 9/35]
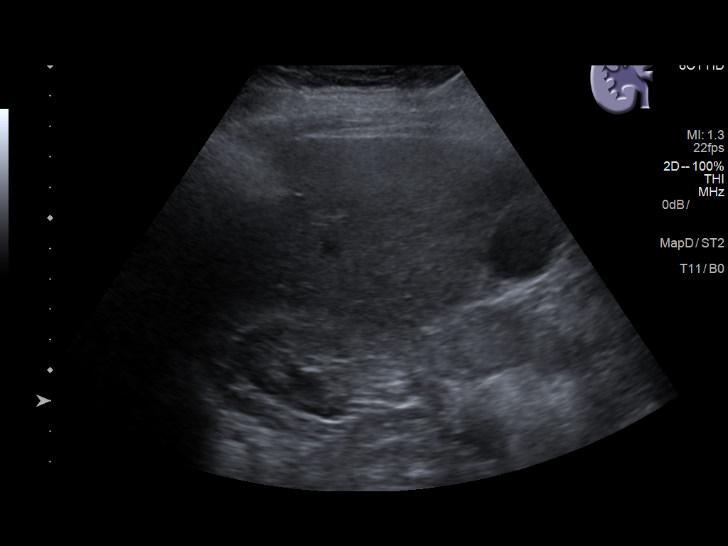
[im 12/35]
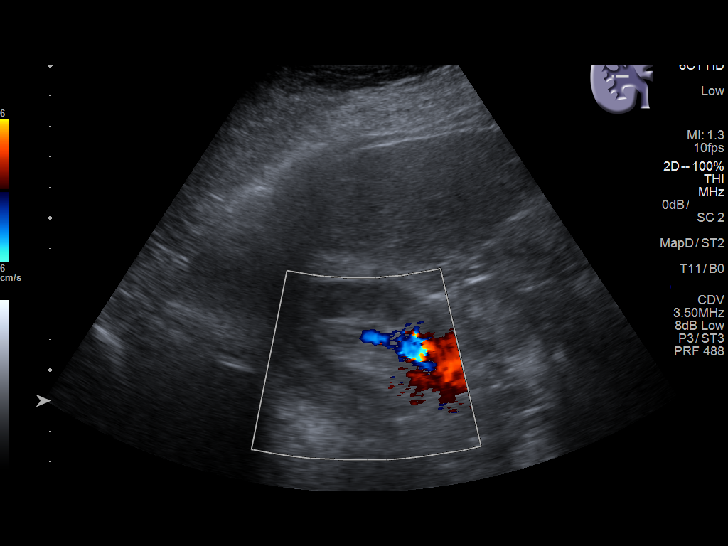
[im 13/35]
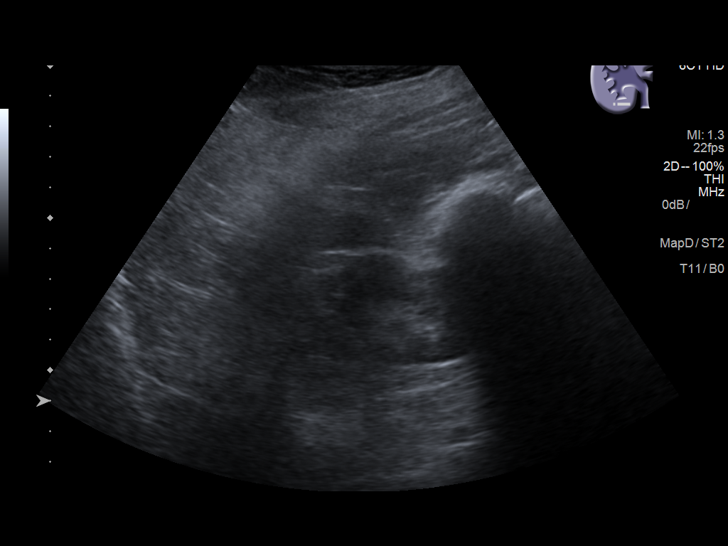
[im 16/35]
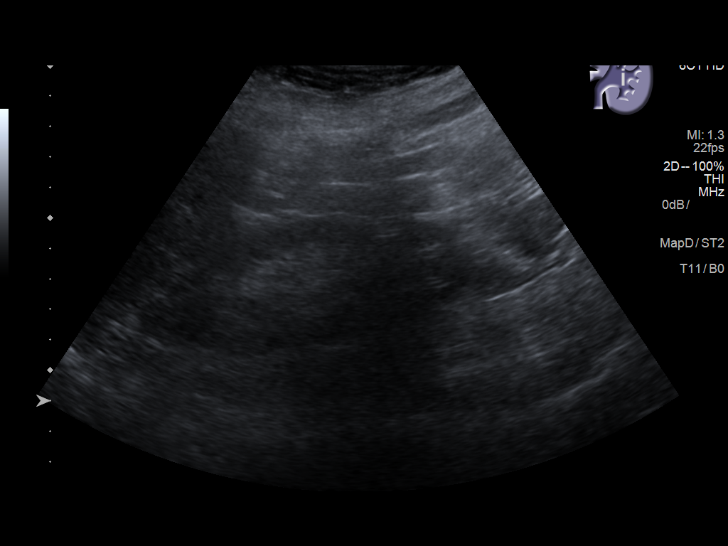
[im 19/35]
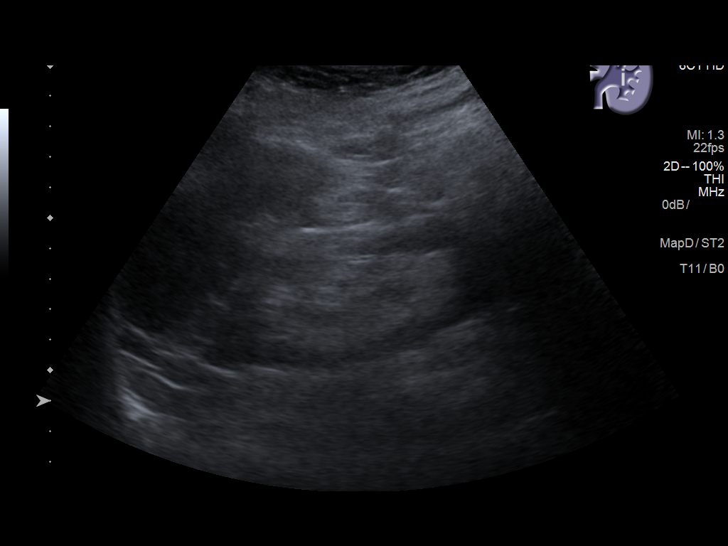
[im 22/35]
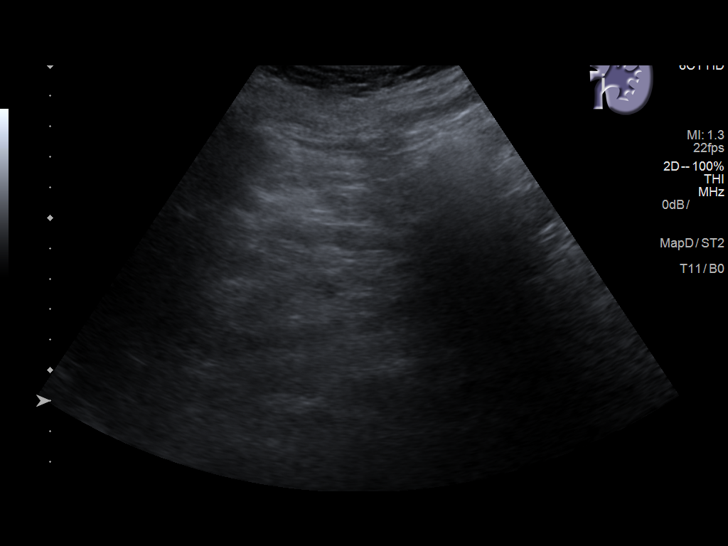
[im 23/35]
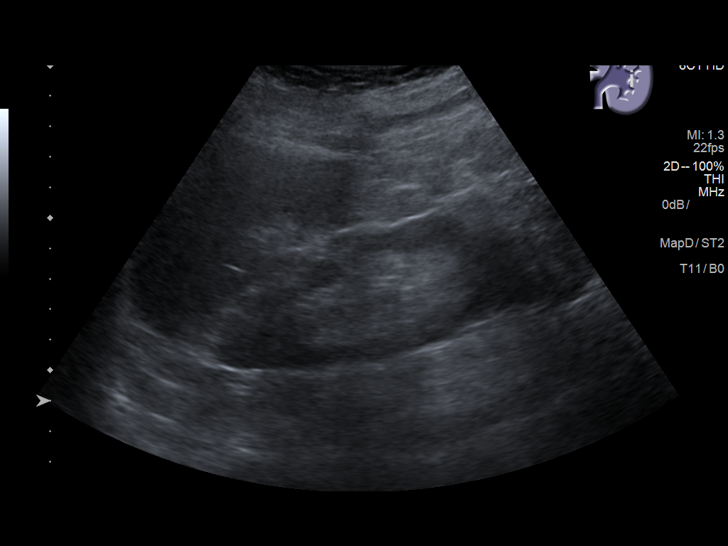
[im 26/35]
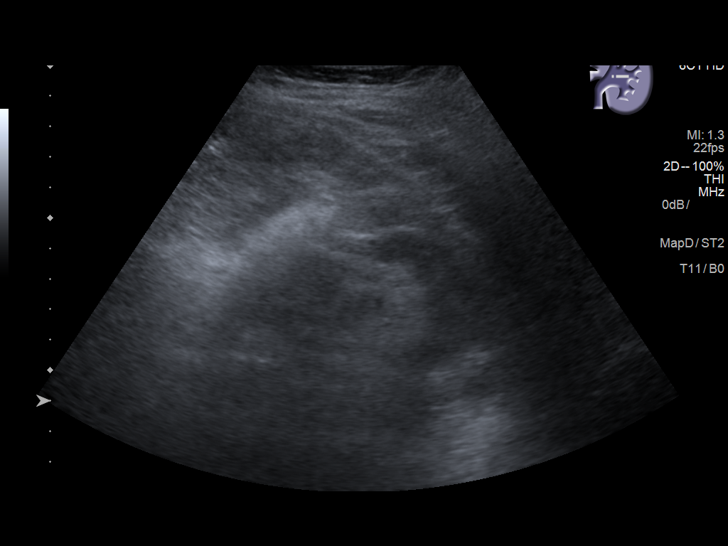
[im 29/35]
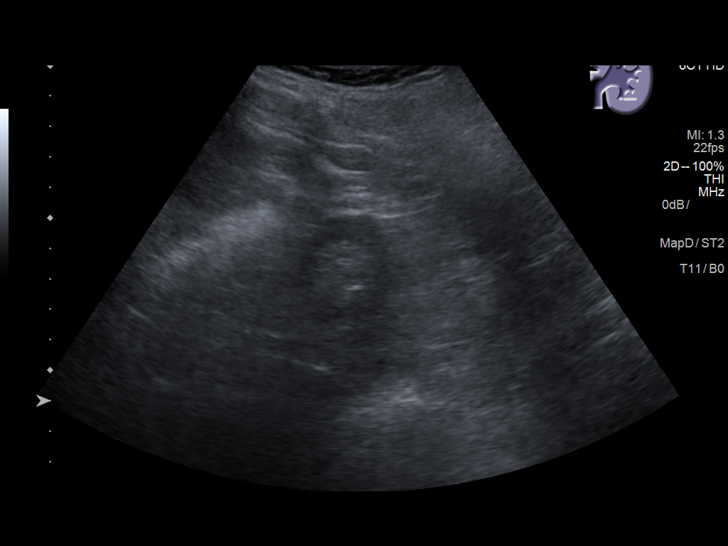
[im 32/35]
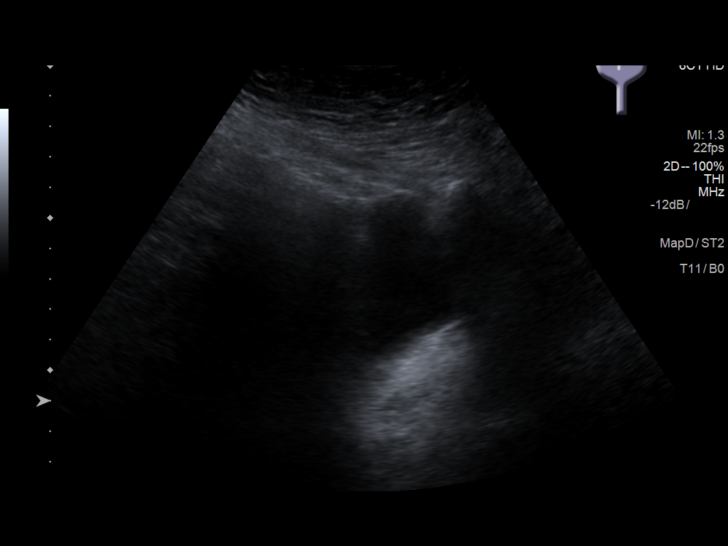
[im 35/35]
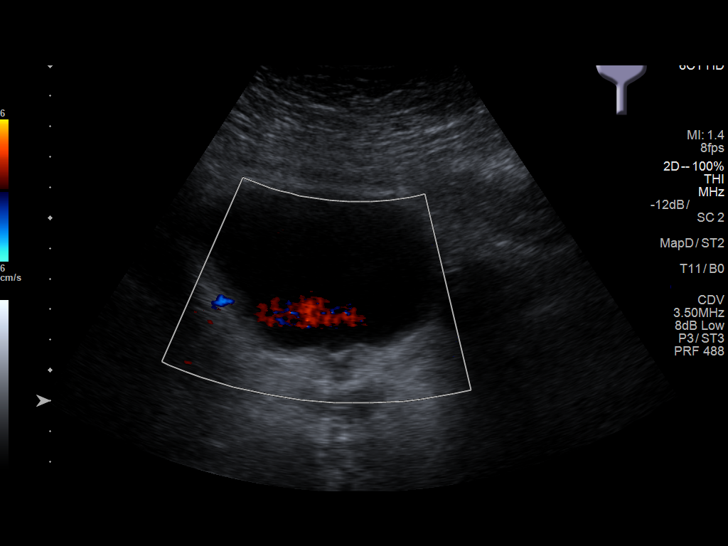

[14 of 25 positions shown; findings below may reference images not displayed]

FINDINGS: Right Kidney:

Length: 8.8 cm. Mild cortical thinning. Normal parenchymal
echogenicity. No mass or stone. No hydronephrosis.

Left Kidney:

Length: 10.9 cm. Mild cortical thinning. Normal parenchymal
echogenicity. No mass or stone. Stone seen within the left kidney on
the prior CT are no longer visualized. No hydronephrosis.

Bladder:

Appears normal for degree of bladder distention.
IMPRESSION: 1. No renal stones or hydronephrosis.
2. Mild renal cortical thinning, right greater than left. No other
abnormality.
